# Patient Record
Sex: Male | Born: 1945 | Race: White | Hispanic: No | Marital: Married | Smoking: Former smoker
Health system: Southern US, Community
[De-identification: ages and names within clinical notes are randomized; demographics above are authoritative.]

## PROBLEM LIST (undated history)

## (undated) DIAGNOSIS — K5790 Diverticulosis of intestine, part unspecified, without perforation or abscess without bleeding: Secondary | ICD-10-CM

## (undated) DIAGNOSIS — E785 Hyperlipidemia, unspecified: Secondary | ICD-10-CM

## (undated) DIAGNOSIS — Z8719 Personal history of other diseases of the digestive system: Secondary | ICD-10-CM

## (undated) DIAGNOSIS — H269 Unspecified cataract: Secondary | ICD-10-CM

## (undated) DIAGNOSIS — I2510 Atherosclerotic heart disease of native coronary artery without angina pectoris: Secondary | ICD-10-CM

## (undated) DIAGNOSIS — T7840XA Allergy, unspecified, initial encounter: Secondary | ICD-10-CM

## (undated) DIAGNOSIS — I739 Peripheral vascular disease, unspecified: Secondary | ICD-10-CM

## (undated) DIAGNOSIS — K222 Esophageal obstruction: Secondary | ICD-10-CM

## (undated) DIAGNOSIS — I10 Essential (primary) hypertension: Secondary | ICD-10-CM

## (undated) DIAGNOSIS — K219 Gastro-esophageal reflux disease without esophagitis: Secondary | ICD-10-CM

## (undated) DIAGNOSIS — C801 Malignant (primary) neoplasm, unspecified: Secondary | ICD-10-CM

## (undated) HISTORY — DX: Essential (primary) hypertension: I10

## (undated) HISTORY — DX: Esophageal obstruction: K22.2

## (undated) HISTORY — DX: Allergy, unspecified, initial encounter: T78.40XA

## (undated) HISTORY — DX: Unspecified cataract: H26.9

## (undated) HISTORY — DX: Peripheral vascular disease, unspecified: I73.9

## (undated) HISTORY — PX: CORONARY ANGIOPLASTY WITH STENT PLACEMENT: SHX49

## (undated) HISTORY — DX: Gastro-esophageal reflux disease without esophagitis: K21.9

## (undated) HISTORY — DX: Hyperlipidemia, unspecified: E78.5

## (undated) HISTORY — DX: Malignant (primary) neoplasm, unspecified: C80.1

## (undated) HISTORY — DX: Atherosclerotic heart disease of native coronary artery without angina pectoris: I25.10

## (undated) HISTORY — DX: Diverticulosis of intestine, part unspecified, without perforation or abscess without bleeding: K57.90

## (undated) HISTORY — PX: CATARACT EXTRACTION: SUR2

---

## 2002-11-01 HISTORY — PX: CORONARY ANGIOPLASTY WITH STENT PLACEMENT: SHX49

## 2003-03-05 ENCOUNTER — Encounter: Payer: Self-pay | Admitting: Cardiovascular Disease

## 2003-03-08 ENCOUNTER — Ambulatory Visit (HOSPITAL_COMMUNITY): Admission: RE | Admit: 2003-03-08 | Discharge: 2003-03-09 | Payer: Self-pay | Admitting: Cardiology

## 2003-03-08 ENCOUNTER — Encounter: Payer: Self-pay | Admitting: Cardiology

## 2004-09-04 ENCOUNTER — Ambulatory Visit: Payer: Self-pay | Admitting: Internal Medicine

## 2004-11-01 HISTORY — PX: SHOULDER SURGERY: SHX246

## 2005-02-17 ENCOUNTER — Ambulatory Visit: Payer: Self-pay | Admitting: Internal Medicine

## 2005-06-09 ENCOUNTER — Ambulatory Visit: Payer: Self-pay | Admitting: Internal Medicine

## 2005-07-09 ENCOUNTER — Ambulatory Visit: Payer: Self-pay | Admitting: Internal Medicine

## 2005-12-01 ENCOUNTER — Ambulatory Visit: Payer: Self-pay | Admitting: Internal Medicine

## 2006-03-02 ENCOUNTER — Ambulatory Visit: Payer: Self-pay | Admitting: Internal Medicine

## 2006-06-01 ENCOUNTER — Ambulatory Visit: Payer: Self-pay | Admitting: Internal Medicine

## 2006-08-17 ENCOUNTER — Ambulatory Visit: Payer: Self-pay | Admitting: Internal Medicine

## 2006-08-17 LAB — CONVERTED CEMR LAB
ALT: 28 units/L (ref 0–40)
AST: 23 units/L (ref 0–37)
Albumin: 3.8 g/dL (ref 3.5–5.2)
BUN: 12 mg/dL (ref 6–23)
Basophils Relative: 0.4 % (ref 0.0–1.0)
Creatinine, Ser: 0.8 mg/dL (ref 0.4–1.5)
Eosinophil percent: 2.1 % (ref 0.0–5.0)
GFR calc non Af Amer: 104 mL/min
HCT: 41.9 % (ref 39.0–52.0)
HDL: 36.6 mg/dL — ABNORMAL LOW (ref >39.0)
Hemoglobin: 14 g/dL (ref 13.0–17.0)
Hgb A1c MFr Bld: 7.9 % — ABNORMAL HIGH (ref 4.6–6.0)
MCHC: 33.5 g/dL (ref 30.0–36.0)
Neutro Abs: 4.6 10*3/uL (ref 1.4–7.7)
Neutrophils Relative %: 66.3 % (ref 43.0–77.0)
PSA: 0.61 ng/mL (ref 0.10–4.00)
Potassium: 3.9 meq/L (ref 3.5–5.1)
RBC: 4.73 M/uL (ref 4.22–5.81)
RDW: 12.5 % (ref 11.5–14.6)
Sodium: 141 meq/L (ref 135–145)
Total Bilirubin: 0.6 mg/dL (ref 0.3–1.2)
Triglyceride fasting, serum: 158 mg/dL — ABNORMAL HIGH (ref 0–149)
WBC: 6.8 10*3/uL (ref 4.5–10.5)

## 2006-08-24 ENCOUNTER — Ambulatory Visit: Payer: Self-pay | Admitting: Internal Medicine

## 2006-11-23 ENCOUNTER — Ambulatory Visit: Payer: Self-pay | Admitting: Internal Medicine

## 2006-11-23 LAB — CONVERTED CEMR LAB: Hgb A1c MFr Bld: 7.1 % — ABNORMAL HIGH (ref 4.6–6.0)

## 2007-02-22 ENCOUNTER — Ambulatory Visit: Payer: Self-pay | Admitting: Internal Medicine

## 2007-05-24 DIAGNOSIS — E1151 Type 2 diabetes mellitus with diabetic peripheral angiopathy without gangrene: Secondary | ICD-10-CM | POA: Insufficient documentation

## 2007-05-24 DIAGNOSIS — I10 Essential (primary) hypertension: Secondary | ICD-10-CM | POA: Insufficient documentation

## 2007-05-24 DIAGNOSIS — I2510 Atherosclerotic heart disease of native coronary artery without angina pectoris: Secondary | ICD-10-CM | POA: Insufficient documentation

## 2007-07-12 ENCOUNTER — Ambulatory Visit: Payer: Self-pay | Admitting: Internal Medicine

## 2007-07-12 LAB — CONVERTED CEMR LAB: Hgb A1c MFr Bld: 6.8 % — ABNORMAL HIGH (ref 4.6–6.0)

## 2007-11-08 ENCOUNTER — Ambulatory Visit: Payer: Self-pay | Admitting: Internal Medicine

## 2007-11-08 DIAGNOSIS — J069 Acute upper respiratory infection, unspecified: Secondary | ICD-10-CM | POA: Insufficient documentation

## 2007-11-08 LAB — CONVERTED CEMR LAB: Hgb A1c MFr Bld: 6.9 % — ABNORMAL HIGH (ref 4.6–6.0)

## 2008-03-12 ENCOUNTER — Encounter: Payer: Self-pay | Admitting: Internal Medicine

## 2008-03-13 ENCOUNTER — Ambulatory Visit: Payer: Self-pay | Admitting: Internal Medicine

## 2008-03-13 DIAGNOSIS — I739 Peripheral vascular disease, unspecified: Secondary | ICD-10-CM | POA: Insufficient documentation

## 2008-05-07 ENCOUNTER — Ambulatory Visit: Payer: Self-pay | Admitting: Internal Medicine

## 2008-05-07 DIAGNOSIS — R109 Unspecified abdominal pain: Secondary | ICD-10-CM | POA: Insufficient documentation

## 2008-06-03 ENCOUNTER — Encounter: Payer: Self-pay | Admitting: Internal Medicine

## 2008-06-03 ENCOUNTER — Telehealth: Payer: Self-pay | Admitting: Internal Medicine

## 2008-07-31 ENCOUNTER — Ambulatory Visit: Payer: Self-pay | Admitting: Internal Medicine

## 2008-10-02 ENCOUNTER — Ambulatory Visit: Payer: Self-pay | Admitting: Internal Medicine

## 2009-01-08 ENCOUNTER — Ambulatory Visit: Payer: Self-pay | Admitting: Internal Medicine

## 2009-01-08 LAB — CONVERTED CEMR LAB: Hgb A1c MFr Bld: 6.2 % — ABNORMAL HIGH (ref 4.6–6.0)

## 2009-03-24 ENCOUNTER — Telehealth: Payer: Self-pay | Admitting: Internal Medicine

## 2009-05-07 ENCOUNTER — Ambulatory Visit: Payer: Self-pay | Admitting: Internal Medicine

## 2009-05-07 DIAGNOSIS — K219 Gastro-esophageal reflux disease without esophagitis: Secondary | ICD-10-CM | POA: Insufficient documentation

## 2009-05-29 ENCOUNTER — Encounter: Payer: Self-pay | Admitting: Internal Medicine

## 2009-05-29 LAB — HM DIABETES EYE EXAM

## 2009-09-17 ENCOUNTER — Ambulatory Visit: Payer: Self-pay | Admitting: Internal Medicine

## 2009-09-17 LAB — CONVERTED CEMR LAB
Bilirubin Urine: NEGATIVE
Blood in Urine, dipstick: NEGATIVE
Ketones, urine, test strip: NEGATIVE
Nitrite: NEGATIVE
Protein, U semiquant: NEGATIVE
Urobilinogen, UA: 0.2

## 2009-09-18 ENCOUNTER — Encounter (INDEPENDENT_AMBULATORY_CARE_PROVIDER_SITE_OTHER): Payer: Self-pay | Admitting: *Deleted

## 2009-09-18 LAB — CONVERTED CEMR LAB
ALT: 31 units/L (ref 0–53)
AST: 26 units/L (ref 0–37)
Albumin: 4.2 g/dL (ref 3.5–5.2)
BUN: 11 mg/dL (ref 6–23)
Basophils Absolute: 0 10*3/uL (ref 0.0–0.1)
Basophils Relative: 0.3 % (ref 0.0–3.0)
Chloride: 107 meq/L (ref 96–112)
Cholesterol: 155 mg/dL (ref 0–200)
Eosinophils Relative: 1.8 % (ref 0.0–5.0)
GFR calc non Af Amer: 79.86 mL/min (ref >60)
HCT: 38.2 % — ABNORMAL LOW (ref 39.0–52.0)
HDL: 44.5 mg/dL (ref >39.00)
Hemoglobin: 12.9 g/dL — ABNORMAL LOW (ref 13.0–17.0)
LDL Cholesterol: 81 mg/dL (ref 0–99)
Lymphs Abs: 1.4 10*3/uL (ref 0.7–4.0)
Monocytes Relative: 5.7 % (ref 3.0–12.0)
Neutro Abs: 4.3 10*3/uL (ref 1.4–7.7)
RBC: 4.34 M/uL (ref 4.22–5.81)
RDW: 12.8 % (ref 11.5–14.6)
TSH: 0.57 microintl units/mL (ref 0.35–5.50)
Total CHOL/HDL Ratio: 3
Total Protein: 6.9 g/dL (ref 6.0–8.3)
Triglycerides: 149 mg/dL (ref 0.0–149.0)

## 2009-10-02 ENCOUNTER — Encounter (INDEPENDENT_AMBULATORY_CARE_PROVIDER_SITE_OTHER): Payer: Self-pay | Admitting: *Deleted

## 2009-10-06 ENCOUNTER — Ambulatory Visit: Payer: Self-pay | Admitting: Internal Medicine

## 2009-10-06 ENCOUNTER — Encounter (INDEPENDENT_AMBULATORY_CARE_PROVIDER_SITE_OTHER): Payer: Self-pay | Admitting: *Deleted

## 2009-10-16 ENCOUNTER — Ambulatory Visit: Payer: Self-pay | Admitting: Internal Medicine

## 2009-10-16 LAB — HM COLONOSCOPY

## 2009-12-16 ENCOUNTER — Ambulatory Visit: Payer: Self-pay | Admitting: Internal Medicine

## 2009-12-16 LAB — CONVERTED CEMR LAB: Hgb A1c MFr Bld: 6.5 % (ref 4.6–6.5)

## 2009-12-19 ENCOUNTER — Telehealth: Payer: Self-pay

## 2009-12-22 ENCOUNTER — Telehealth: Payer: Self-pay | Admitting: Internal Medicine

## 2009-12-23 ENCOUNTER — Ambulatory Visit: Payer: Self-pay | Admitting: Internal Medicine

## 2010-03-17 ENCOUNTER — Ambulatory Visit: Payer: Self-pay | Admitting: Internal Medicine

## 2010-03-17 LAB — CONVERTED CEMR LAB: Hgb A1c MFr Bld: 6.3 % (ref 4.6–6.5)

## 2010-07-15 ENCOUNTER — Ambulatory Visit: Payer: Self-pay | Admitting: Internal Medicine

## 2010-07-15 LAB — CONVERTED CEMR LAB: Hgb A1c MFr Bld: 6.5 % (ref 4.6–6.5)

## 2010-08-03 ENCOUNTER — Telehealth: Payer: Self-pay

## 2010-11-10 ENCOUNTER — Encounter: Payer: Self-pay | Admitting: Internal Medicine

## 2010-11-11 ENCOUNTER — Ambulatory Visit
Admission: RE | Admit: 2010-11-11 | Discharge: 2010-11-11 | Payer: Self-pay | Source: Home / Self Care | Attending: Internal Medicine | Admitting: Internal Medicine

## 2010-11-11 ENCOUNTER — Other Ambulatory Visit: Payer: Self-pay | Admitting: Internal Medicine

## 2010-11-11 LAB — HEMOGLOBIN A1C: Hgb A1c MFr Bld: 6.7 % — ABNORMAL HIGH (ref 4.6–6.5)

## 2010-12-03 NOTE — Progress Notes (Signed)
Summary: Status of Paperwork?  Phone Note Call from Patient   Caller: Patient [phone] Reason for Call: Talk to Nurse, Talk to Doctor Summary of Call: Pt called to check on status of paperwork that he was supposed to have sent to [facility]..... Pt adv that he dropped a copy off at [facility] front desk on 12/10/2009.... Pt also faxed copy of paperwork this morning (12/22/2009).... Wants to make sure same was received...?  Initial call taken by: Debbra Riding,  December 22, 2009 12:46 PM  Follow-up for Phone Call        form rec'd - give to cindy to complete , informed pt will fax as soon as signed.  KIK Follow-up by: Duard Brady LPN,  December 22, 2009 2:31 PM

## 2010-12-03 NOTE — Assessment & Plan Note (Signed)
Summary: 66 month rov/njr   Vital Signs:  Patient profile:   65 year old male Weight:      182 pounds Temp:     97.7 degrees F oral BP sitting:   140 / 70  (right arm) Cuff size:   regular  Vitals Entered By: Duard Brady LPN (December 16, 2009 10:09 AM) CC: ROV / no problems  - requesting chang in PPI       FBS 122   CC:  ROV / no problems  - requesting chang in PPI       FBS 122.  History of Present Illness: 65 year old patient with history of hypertension, coronary artery disease, type 2 diabetes, and dyslipidemia.  He is done remarkably well.  No concerns or complaints.  His hemoglobin A1c last visit 6.1.  He continues to enjoy a nice glycemic control.  Denies any exertional chest pain.  He is now on omeprazole for gastroesophageal reflux disease and prefers this medication.  Allergies: No Known Drug Allergies  Past History:  Past Medical History: Reviewed history from 05/07/2009 and no changes required. Coronary artery disease Diabetes mellitus, type II Hypertension Hypercholesterolemia Peripheral vascular disease GERD  Past Surgical History: Angioplasty 2004 sigmoidoscopy 2003; colonoscopy December 2010  Family History: Reviewed history from 07/12/2007 and no changes required. Family History Other cancer-Breast father died age 56, pneumonia mother history breast cancer one brother one sister in good health  Review of Systems  The patient denies anorexia, fever, weight loss, weight gain, vision loss, decreased hearing, hoarseness, chest pain, syncope, dyspnea on exertion, peripheral edema, prolonged cough, headaches, hemoptysis, abdominal pain, melena, hematochezia, severe indigestion/heartburn, hematuria, incontinence, genital sores, muscle weakness, suspicious skin lesions, transient blindness, difficulty walking, depression, unusual weight change, abnormal bleeding, enlarged lymph nodes, angioedema, breast masses, and testicular masses.    Physical  Exam  General:  Well-developed,well-nourished,in no acute distress; alert,appropriate and cooperative throughout examination; normal blood pressure Head:  Normocephalic and atraumatic without obvious abnormalities. No apparent alopecia or balding. Eyes:  No corneal or conjunctival inflammation noted. EOMI. Perrla. Funduscopic exam benign, without hemorrhages, exudates or papilledema. Vision grossly normal. Mouth:  Oral mucosa and oropharynx without lesions or exudates.  Teeth in good repair. Neck:  No deformities, masses, or tenderness noted. Lungs:  Normal respiratory effort, chest expands symmetrically. Lungs are clear to auscultation, no crackles or wheezes. Heart:  Normal rate and regular rhythm. S1 and S2 normal without gallop, murmur, click, rub or other extra sounds. Abdomen:  Bowel sounds positive,abdomen soft and non-tender without masses, organomegaly or hernias noted. Msk:  No deformity or scoliosis noted of thoracic or lumbar spine.   Pulses:  absent right posterior tibial pulse Extremities:  No clubbing, cyanosis, edema, or deformity noted with normal full range of motion of all joints.   Skin:  Intact without suspicious lesions or rashes Cervical Nodes:  No lymphadenopathy noted   Impression & Recommendations:  Problem # 1:  HYPERTENSION (ICD-401.9)  His updated medication list for this problem includes:    Lisinopril-hydrochlorothiazide 20-12.5 Mg Tabs (Lisinopril-hydrochlorothiazide) .Marland Kitchen... 1 once daily    Amlodipine Besylate 5 Mg Tabs (Amlodipine besylate) ..... One tablet daily  His updated medication list for this problem includes:    Lisinopril-hydrochlorothiazide 20-12.5 Mg Tabs (Lisinopril-hydrochlorothiazide) .Marland Kitchen... 1 once daily    Amlodipine Besylate 5 Mg Tabs (Amlodipine besylate) ..... One tablet daily  Problem # 2:  DIABETES MELLITUS, TYPE II (ICD-250.00)  His updated medication list for this problem includes:    Metformin Hcl 1000  Mg Tabs (Metformin hcl)  .Marland Kitchen... 1 two times a day    Lisinopril-hydrochlorothiazide 20-12.5 Mg Tabs (Lisinopril-hydrochlorothiazide) .Marland Kitchen... 1 once daily    Adult Aspirin Low Strength 81 Mg Tbdp (Aspirin) .Marland Kitchen... 1 once daily as needed    Glimepiride 4 Mg Tabs (Glimepiride) .Marland Kitchen... 1/2 once daily    His updated medication list for this problem includes:    Metformin Hcl 1000 Mg Tabs (Metformin hcl) .Marland Kitchen... 1 two times a day    Lisinopril-hydrochlorothiazide 20-12.5 Mg Tabs (Lisinopril-hydrochlorothiazide) .Marland Kitchen... 1 once daily    Adult Aspirin Low Strength 81 Mg Tbdp (Aspirin) .Marland Kitchen... 1 once daily as needed    Glimepiride 4 Mg Tabs (Glimepiride) .Marland Kitchen... 1/2 once daily  Problem # 3:  CORONARY ARTERY DISEASE (ICD-414.00)  His updated medication list for this problem includes:    Lisinopril-hydrochlorothiazide 20-12.5 Mg Tabs (Lisinopril-hydrochlorothiazide) .Marland Kitchen... 1 once daily    Adult Aspirin Low Strength 81 Mg Tbdp (Aspirin) .Marland Kitchen... 1 once daily as needed    Amlodipine Besylate 5 Mg Tabs (Amlodipine besylate) ..... One tablet daily  His updated medication list for this problem includes:    Lisinopril-hydrochlorothiazide 20-12.5 Mg Tabs (Lisinopril-hydrochlorothiazide) .Marland Kitchen... 1 once daily    Adult Aspirin Low Strength 81 Mg Tbdp (Aspirin) .Marland Kitchen... 1 once daily as needed    Amlodipine Besylate 5 Mg Tabs (Amlodipine besylate) ..... One tablet daily  Complete Medication List: 1)  Metformin Hcl 1000 Mg Tabs (Metformin hcl) .Marland Kitchen.. 1 two times a day 2)  Lisinopril-hydrochlorothiazide 20-12.5 Mg Tabs (Lisinopril-hydrochlorothiazide) .Marland Kitchen.. 1 once daily 3)  Simvastatin 80 Mg Tabs (simvastatin)  .Marland Kitchen.. 1 once daily 4)  Adult Aspirin Low Strength 81 Mg Tbdp (Aspirin) .Marland Kitchen.. 1 once daily as needed 5)  Glimepiride 4 Mg Tabs (Glimepiride) .... 1/2 once daily 6)  Hydrocodone-acetaminophen 5-500 Mg Tabs (Hydrocodone-acetaminophen) .... One every 6 hours as needed for pain 7)  Amlodipine Besylate 5 Mg Tabs (Amlodipine besylate) .... One tablet daily 8)   Omeprazole 20 Mg Cpdr (Omeprazole) .... One daily  Other Orders: Venipuncture (04540) TLB-A1C / Hgb A1C (Glycohemoglobin) (83036-A1C)  Patient Instructions: 1)  Please schedule a follow-up appointment in 3 months. 2)  Limit your Sodium (Salt). 3)  Avoid foods high in acid (tomatoes, citrus juices, spicy foods). Avoid eating within two hours of lying down or before exercising. Do not over eat; try smaller more frequent meals. Elevate head of bed twelve inches when sleeping. 4)  It is important that you exercise regularly at least 20 minutes 5 times a week. If you develop chest pain, have severe difficulty breathing, or feel very tired , stop exercising immediately and seek medical attention. 5)  Check your blood sugars regularly. If your readings are usually above : or below 70 you should contact our office. 6)  It is important that your Diabetic A1c level is checked every 3 months. 7)  See your eye doctor yearly to check for diabetic eye damage. Prescriptions: OMEPRAZOLE 20 MG CPDR (OMEPRAZOLE) one daily  #90 x 6   Entered and Authorized by:   Gordy Savers  MD   Signed by:   Gordy Savers  MD on 12/16/2009   Method used:   Print then Give to Patient   RxID:   [id number] OMEPRAZOLE 20 MG CPDR (OMEPRAZOLE) one daily  #90 x 6   Entered and Authorized by:   Gordy Savers  MD   Signed by:   Gordy Savers  MD on 12/16/2009   Method used:   Electronically to  [facility] (retail)       [address]       [place], [place]  [address]       Ph: [phone]       Fax: [phone]   RxID:   [id number]

## 2010-12-03 NOTE — Assessment & Plan Note (Signed)
Summary: 3 month fup//ccm   Vital Signs:  Patient profile:   65 year old male Weight:      180 pounds Temp:     97.8 degrees F oral BP sitting:   112 / 70  (right arm) Cuff size:   regular  Vitals Entered By: Duard Brady LPN (Mar 17, 2010 10:26 AM) CC: 3 mos rov - doing well    fbs 130 Is Patient Diabetic? Yes Did you bring your meter with you today? No   CC:  3 mos rov - doing well    fbs 130.  History of Present Illness: 65 year old patient who is seen today for follow up of his type 2 diabetes, hypertension, and dyslipidemia.  He has peripheral vascular disease, which has been stable.  He denies any claudication.  He has coronary  Artery disease, which has also been stable.  He denies any exertional chest pain.  His last hemoglobin A1c 6.5, up from 6.1.  Preventive Screening-Counseling & Management  Alcohol-Tobacco     Smoking Status: never  Allergies (verified): No Known Drug Allergies  Past History:  Past Medical History: Reviewed history from 05/07/2009 and no changes required. Coronary artery disease Diabetes mellitus, type II Hypertension Hypercholesterolemia Peripheral vascular disease GERD  Social History: Smoking Status:  never  Review of Systems  The patient denies anorexia, fever, weight loss, weight gain, vision loss, decreased hearing, hoarseness, chest pain, syncope, dyspnea on exertion, peripheral edema, prolonged cough, headaches, hemoptysis, abdominal pain, melena, hematochezia, severe indigestion/heartburn, hematuria, incontinence, genital sores, muscle weakness, suspicious skin lesions, transient blindness, difficulty walking, depression, unusual weight change, abnormal bleeding, enlarged lymph nodes, angioedema, breast masses, and testicular masses.    Physical Exam  General:  Well-developed,well-nourished,in no acute distress; alert,appropriate and cooperative throughout examination Head:  Normocephalic and atraumatic without obvious  abnormalities. No apparent alopecia or balding. Eyes:  No corneal or conjunctival inflammation noted. EOMI. Perrla. Funduscopic exam benign, without hemorrhages, exudates or papilledema. Vision grossly normal. Mouth:  Oral mucosa and oropharynx without lesions or exudates.  Teeth in good repair. Neck:  No deformities, masses, or tenderness noted. Lungs:  Normal respiratory effort, chest expands symmetrically. Lungs are clear to auscultation, no crackles or wheezes. Heart:  Normal rate and regular rhythm. S1 and S2 normal without gallop, murmur, click, rub or other extra sounds. Abdomen:  Bowel sounds positive,abdomen soft and non-tender without masses, organomegaly or hernias noted.   Impression & Recommendations:  Problem # 1:  HYPERTENSION (ICD-401.9)  His updated medication list for this problem includes:    Lisinopril-hydrochlorothiazide 20-12.5 Mg Tabs (Lisinopril-hydrochlorothiazide) .Marland Kitchen... 1 once daily    Amlodipine Besylate 5 Mg Tabs (Amlodipine besylate) ..... One tablet daily  His updated medication list for this problem includes:    Lisinopril-hydrochlorothiazide 20-12.5 Mg Tabs (Lisinopril-hydrochlorothiazide) .Marland Kitchen... 1 once daily    Amlodipine Besylate 5 Mg Tabs (Amlodipine besylate) ..... One tablet daily  Problem # 2:  DIABETES MELLITUS, TYPE II (ICD-250.00)  His updated medication list for this problem includes:    Metformin Hcl 1000 Mg Tabs (Metformin hcl) .Marland Kitchen... 1 two times a day    Lisinopril-hydrochlorothiazide 20-12.5 Mg Tabs (Lisinopril-hydrochlorothiazide) .Marland Kitchen... 1 once daily    Adult Aspirin Low Strength 81 Mg Tbdp (Aspirin) .Marland Kitchen... 1 once daily as needed    Glimepiride 4 Mg Tabs (Glimepiride) .Marland Kitchen... 1/2 once daily    His updated medication list for this problem includes:    Metformin Hcl 1000 Mg Tabs (Metformin hcl) .Marland Kitchen... 1 two times a day  Lisinopril-hydrochlorothiazide 20-12.5 Mg Tabs (Lisinopril-hydrochlorothiazide) .Marland Kitchen... 1 once daily    Adult Aspirin Low  Strength 81 Mg Tbdp (Aspirin) .Marland Kitchen... 1 once daily as needed    Glimepiride 4 Mg Tabs (Glimepiride) .Marland Kitchen... 1/2 once daily  Complete Medication List: 1)  Metformin Hcl 1000 Mg Tabs (Metformin hcl) .Marland Kitchen.. 1 two times a day 2)  Lisinopril-hydrochlorothiazide 20-12.5 Mg Tabs (Lisinopril-hydrochlorothiazide) .Marland Kitchen.. 1 once daily 3)  Simvastatin 80 Mg Tabs (simvastatin)  .Marland Kitchen.. 1 once daily 4)  Adult Aspirin Low Strength 81 Mg Tbdp (Aspirin) .Marland Kitchen.. 1 once daily as needed 5)  Glimepiride 4 Mg Tabs (Glimepiride) .... 1/2 once daily 6)  Amlodipine Besylate 5 Mg Tabs (Amlodipine besylate) .... One tablet daily 7)  Omeprazole 20 Mg Cpdr (Omeprazole) .... One daily  Other Orders: Venipuncture ([address]) TLB-A1C / Hgb A1C (Glycohemoglobin) (83036-A1C)  Patient Instructions: 1)  It is important that you exercise regularly at least 20 minutes 5 times a week. If you develop chest pain, have severe difficulty breathing, or feel very tired , stop exercising immediately and seek medical attention. 2)  Check your blood sugars regularly. If your readings are usually above : or below 70 you should contact our office. 3)  It is important that your Diabetic A1c level is checked every 3 months. 4)  See your eye doctor yearly to check for diabetic eye damage. 5)  Please schedule a follow-up appointment in 4 months.

## 2010-12-03 NOTE — Letter (Signed)
Summary: Health Screening Results   Health Screening Results   Imported By: Maryln Gottron 12/29/2009 10:48:37  _____________________________________________________________________  External Attachment:    Type:   Image     Comment:   External Document

## 2010-12-03 NOTE — Assessment & Plan Note (Signed)
Summary: 4 month rov/njr   Vital Signs:  Patient profile:   65 year old male Weight:      181 pounds O2 Sat:      95 % on Room air Temp:     97.9 degrees F oral Pulse rate:   90 / minute Resp:     16 per minute BP sitting:   120 / 78  (left arm) Cuff size:   regular  Vitals Entered By: Duard Brady LPN (November 11, 2010 11:39 AM)  O2 Flow:  Room air CC: 4 mos rov - doing well Is Patient Diabetic? No   CC:  4 mos rov - doing well.  History of Present Illness: 30  is for follow-up of type 2 diabetes.  Is maintained excellent glycemic control.  Is having no hypoglycemia.  Hemoglobin A1c have been consistently less than 6.5. He has coronary artery disease, and peripheral vascular disease, which have been stable.  Denies any exertional chest pain or claudication he is treated hypertension, which has been stable.  He remains on triple therapy. He also remains on simvastatin 20 mg daily, which he continues to tolerate.  Preventive Screening-Counseling & Management  Alcohol-Tobacco     Smoking Status: quit     Year Quit: 2006  Allergies (verified): No Known Drug Allergies  Past History:  Past Medical History: Reviewed history from 05/07/2009 and no changes required. Coronary artery disease Diabetes mellitus, type II Hypertension Hypercholesterolemia Peripheral vascular disease GERD  Past Surgical History: Reviewed history from 12/16/2009 and no changes required. Angioplasty 2004 sigmoidoscopy 2003; colonoscopy December 2010  Social History: Smoking Status:  quit  Review of Systems  The patient denies anorexia, fever, weight loss, weight gain, vision loss, decreased hearing, hoarseness, chest pain, syncope, dyspnea on exertion, peripheral edema, prolonged cough, headaches, hemoptysis, abdominal pain, melena, hematochezia, severe indigestion/heartburn, hematuria, incontinence, genital sores, muscle weakness, suspicious skin lesions, transient blindness,  difficulty walking, depression, unusual weight change, abnormal bleeding, enlarged lymph nodes, angioedema, breast masses, and testicular masses.    Physical Exam  General:  Well-developed,well-nourished,in no acute distress; alert,appropriate and cooperative throughout examination Head:  Normocephalic and atraumatic without obvious abnormalities. No apparent alopecia or balding. Eyes:  No corneal or conjunctival inflammation noted. EOMI. Perrla. Funduscopic exam benign, without hemorrhages, exudates or papilledema. Vision grossly normal. Mouth:  Oral mucosa and oropharynx without lesions or exudates.  Teeth in good repair. Neck:  No deformities, masses, or tenderness noted. Lungs:  Normal respiratory effort, chest expands symmetrically. Lungs are clear to auscultation, no crackles or wheezes. Heart:  Normal rate and regular rhythm. S1 and S2 normal without gallop, murmur, click, rub or other extra sounds. Abdomen:  Bowel sounds positive,abdomen soft and non-tender without masses, organomegaly or hernias noted. Msk:  No deformity or scoliosis noted of thoracic or lumbar spine.   Extremities:  No clubbing, cyanosis, edema, or deformity noted with normal full range of motion of all joints.    Diabetes Management Exam:    Eye Exam:       Eye Exam done here today          Results: normal   Impression & Recommendations:  Problem # 1:  PERIPHERAL VASCULAR DISEASE (ICD-443.9)  Problem # 2:  HYPERTENSION (ICD-401.9)  His updated medication list for this problem includes:    Lisinopril-hydrochlorothiazide 20-12.5 Mg Tabs (Lisinopril-hydrochlorothiazide) .Marland Kitchen... 1 once daily    Amlodipine Besylate 5 Mg Tabs (Amlodipine besylate) ..... One tablet daily  His updated medication list for this problem includes:  Lisinopril-hydrochlorothiazide 20-12.5 Mg Tabs (Lisinopril-hydrochlorothiazide) .Marland Kitchen... 1 once daily    Amlodipine Besylate 5 Mg Tabs (Amlodipine besylate) ..... One tablet daily  Problem #  3:  DIABETES MELLITUS, TYPE II (ICD-250.00)  His updated medication list for this problem includes:    Metformin Hcl 1000 Mg Tabs (Metformin hcl) .Marland Kitchen... 1 two times a day    Lisinopril-hydrochlorothiazide 20-12.5 Mg Tabs (Lisinopril-hydrochlorothiazide) .Marland Kitchen... 1 once daily    Adult Aspirin Low Strength 81 Mg Tbdp (Aspirin) .Marland Kitchen... 1 once daily as needed    Glimepiride 4 Mg Tabs (Glimepiride) .Marland Kitchen... 1/2 once daily    His updated medication list for this problem includes:    Metformin Hcl 1000 Mg Tabs (Metformin hcl) .Marland Kitchen... 1 two times a day    Lisinopril-hydrochlorothiazide 20-12.5 Mg Tabs (Lisinopril-hydrochlorothiazide) .Marland Kitchen... 1 once daily    Adult Aspirin Low Strength 81 Mg Tbdp (Aspirin) .Marland Kitchen... 1 once daily as needed    Glimepiride 4 Mg Tabs (Glimepiride) .Marland Kitchen... 1/2 once daily  Orders: Venipuncture ([address]) TLB-A1C / Hgb A1C (Glycohemoglobin) (83036-A1C) Specimen Handling ([address])  Problem # 4:  CORONARY ARTERY DISEASE (ICD-414.00)  His updated medication list for this problem includes:    Lisinopril-hydrochlorothiazide 20-12.5 Mg Tabs (Lisinopril-hydrochlorothiazide) .Marland Kitchen... 1 once daily    Adult Aspirin Low Strength 81 Mg Tbdp (Aspirin) .Marland Kitchen... 1 once daily as needed    Amlodipine Besylate 5 Mg Tabs (Amlodipine besylate) ..... One tablet daily  His updated medication list for this problem includes:    Lisinopril-hydrochlorothiazide 20-12.5 Mg Tabs (Lisinopril-hydrochlorothiazide) .Marland Kitchen... 1 once daily    Adult Aspirin Low Strength 81 Mg Tbdp (Aspirin) .Marland Kitchen... 1 once daily as needed    Amlodipine Besylate 5 Mg Tabs (Amlodipine besylate) ..... One tablet daily  Complete Medication List: 1)  Metformin Hcl 1000 Mg Tabs (Metformin hcl) .Marland Kitchen.. 1 two times a day 2)  Lisinopril-hydrochlorothiazide 20-12.5 Mg Tabs (Lisinopril-hydrochlorothiazide) .Marland Kitchen.. 1 once daily 3)  Adult Aspirin Low Strength 81 Mg Tbdp (Aspirin) .Marland Kitchen.. 1 once daily as needed 4)  Glimepiride 4 Mg Tabs (Glimepiride) .... 1/2 once daily 5)   Amlodipine Besylate 5 Mg Tabs (Amlodipine besylate) .... One tablet daily 6)  Omeprazole 20 Mg Cpdr (Omeprazole) .... One daily 7)  Simvastatin 20 Mg Tabs (Simvastatin) .... One daily  Patient Instructions: 1)  Please schedule a follow-up appointment in 4 months for CPX 2)  Limit your Sodium (Salt). 3)  It is important that you exercise regularly at least 20 minutes 5 times a week. If you develop chest pain, have severe difficulty breathing, or feel very tired , stop exercising immediately and seek medical attention. 4)  Check your blood sugars regularly. If your readings are usually above : or below 70 you should contact our office. 5)  It is important that your Diabetic A1c level is checked every 3 months. 6)  See your eye doctor yearly to check for diabetic eye damage. Prescriptions: SIMVASTATIN 20 MG TABS (SIMVASTATIN) one daily  #90 x 6   Entered and Authorized by:   Gordy Savers  MD   Signed by:   Gordy Savers  MD on 11/11/2010   Method used:   Electronically to        [facility] (retail)       [address]       [place], [place]  [address]       Ph: [phone]       Fax: [phone]   RxID:  [id number] OMEPRAZOLE 20 MG CPDR (OMEPRAZOLE) one daily  #90 x 6   Entered and Authorized by:   Gordy Savers  MD   Signed by:   Gordy Savers  MD on 11/11/2010   Method used:   Electronically to        [facility] (retail)       [address]       [place], [place]  [address]       Ph: [phone]       Fax: [phone]   RxID:   [id number] AMLODIPINE BESYLATE 5 MG TABS (AMLODIPINE BESYLATE) one tablet daily  #90 x 6   Entered and Authorized by:   Gordy Savers  MD   Signed by:   Gordy Savers  MD on 11/11/2010   Method used:   Electronically to        [facility] (retail)       [address]       [place], [place]  [address]       Ph:  [phone]       Fax: [phone]   RxID:   [id number] GLIMEPIRIDE 4 MG  TABS (GLIMEPIRIDE) 1/2 once daily  #90 x 3   Entered and Authorized by:   Gordy Savers  MD   Signed by:   Gordy Savers  MD on 11/11/2010   Method used:   Electronically to        [facility] (retail)       [address]       [place], [place]  [address]       Ph: [phone]       Fax: [phone]   RxID:   [id number] LISINOPRIL-HYDROCHLOROTHIAZIDE 20-12.5 MG  TABS (LISINOPRIL-HYDROCHLOROTHIAZIDE) 1 once daily  #90 x 3   Entered and Authorized by:   Gordy Savers  MD   Signed by:   Gordy Savers  MD on 11/11/2010   Method used:   Electronically to        [facility] (retail)       [address]       [place], [place]  [address]       Ph: [phone]       Fax: [phone]   RxID:   [id number] METFORMIN HCL 1000 MG  TABS (METFORMIN HCL) 1 two times a day  #180 x 6   Entered and Authorized by:   Gordy Savers  MD   Signed by:   Gordy Savers  MD on 11/11/2010   Method used:   Electronically to        [facility] (retail)       [address]       [place], [place]  [address]       Ph: [phone]       Fax: [phone]   RxID:   [id number]    Orders Added: 1)  Venipuncture [[address]] 2)  TLB-A1C / Hgb A1C (Glycohemoglobin) [83036-A1C] 3)  Specimen Handling [[address]] 4)  Est. Patient Level IV [[address]]

## 2010-12-03 NOTE — Progress Notes (Signed)
Summary: Needs A1C and Cholesterol Results  Phone Note Call from Patient Call back at [facility] Phone [phone]   Caller: Patient Complaint: Urinary/GYN Problems Summary of Call: Needs to know last A1C and cholesterol results.   Initial call taken by: Trixie Dredge,  August 03, 2010 9:48 AM  Follow-up for Phone Call        called pt - HgA1c  6.5 and there was no cholest. drawn    KIK Follow-up by: Duard Brady LPN,  August 03, 2010 10:57 AM

## 2010-12-03 NOTE — Assessment & Plan Note (Signed)
Summary: 4 month rov/njr   Vital Signs:  Patient profile:   65 year old male Weight:      180 pounds Temp:     97.5 degrees F oral BP sitting:   120 / 70  (right arm) Cuff size:   regular  Vitals Entered By: Duard Brady LPN (July 15, 2010 9:54 AM) CC: 4 mos rov - doing well      fbs130, Hypertension Management Is Patient Diabetic? Yes Did you bring your meter with you today? No Flu Vaccine Consent Questions     Do you have a history of severe allergic reactions to this vaccine? no    Any prior history of allergic reactions to egg and/or gelatin? no    Do you have a sensitivity to the preservative Thimersol? no    Do you have a past history of Guillan-Barre Syndrome? no    Do you currently have an acute febrile illness? no    Have you ever had a severe reaction to latex? no    Vaccine information given and explained to patient? yes    Are you currently pregnant? no    Lot Number:[id number]   Exp Date:05/01/2011   Site Given  Left Deltoid IM   CC:  4 mos rov - doing well      fbs130 and Hypertension Management.  History of Present Illness:  65 year old patient who is seen today for follow-up of his type 2 diabetes.  He has a history of hypertension, which has the well-controlled.  Antihypertensive regimen includes amlodipine.  Is also on simvastatin 80 mg daily for  dyslipidemia.  He is doing quite well without concerns or complaints.  He has peripheral vascular disease, but denies any claudication.  Denies any exertional chest pain.  Hypertension History:      Positive major cardiovascular risk factors include male age 76 years old or older, diabetes, and hypertension.  Negative major cardiovascular risk factors include non-tobacco-user status.        Positive history for target organ damage include ASHD (either angina/prior MI/prior CABG) and peripheral vascular disease.     Allergies (verified): No Known Drug Allergies  Past History:  Past Medical  History: Reviewed history from 05/07/2009 and no changes required. Coronary artery disease Diabetes mellitus, type II Hypertension Hypercholesterolemia Peripheral vascular disease GERD  Review of Systems  The patient denies anorexia, fever, weight loss, weight gain, vision loss, decreased hearing, hoarseness, chest pain, syncope, dyspnea on exertion, peripheral edema, prolonged cough, headaches, hemoptysis, abdominal pain, melena, hematochezia, severe indigestion/heartburn, hematuria, incontinence, genital sores, muscle weakness, suspicious skin lesions, transient blindness, difficulty walking, depression, unusual weight change, abnormal bleeding, enlarged lymph nodes, angioedema, breast masses, and testicular masses.    Physical Exam  General:  Well-developed,well-nourished,in no acute distress; alert,appropriate and cooperative throughout examination Head:  Normocephalic and atraumatic without obvious abnormalities. No apparent alopecia or balding. Eyes:  No corneal or conjunctival inflammation noted. EOMI. Perrla. Funduscopic exam benign, without hemorrhages, exudates or papilledema. Vision grossly normal. Mouth:  Oral mucosa and oropharynx without lesions or exudates.  Teeth in good repair. Neck:  No deformities, masses, or tenderness noted. Lungs:  Normal respiratory effort, chest expands symmetrically. Lungs are clear to auscultation, no crackles or wheezes. Heart:  Normal rate and regular rhythm. S1 and S2 normal without gallop, murmur, click, rub or other extra sounds. Abdomen:  Bowel sounds positive,abdomen soft and non-tender without masses, organomegaly or hernias noted. Msk:  No deformity or scoliosis noted of thoracic or lumbar spine.  Extremities:  No clubbing, cyanosis, edema, or deformity noted with normal full range of motion of all joints.   Skin:  Intact without suspicious lesions or rashes Cervical Nodes:  No lymphadenopathy noted   Impression &  Recommendations:  Problem # 1:  HYPERTENSION (ICD-401.9)  His updated medication list for this problem includes:    Lisinopril-hydrochlorothiazide 20-12.5 Mg Tabs (Lisinopril-hydrochlorothiazide) .Marland Kitchen... 1 once daily    Amlodipine Besylate 5 Mg Tabs (Amlodipine besylate) ..... One tablet daily  Problem # 2:  DIABETES MELLITUS, TYPE II (ICD-250.00)  His updated medication list for this problem includes:    Metformin Hcl 1000 Mg Tabs (Metformin hcl) .Marland Kitchen... 1 two times a day    Lisinopril-hydrochlorothiazide 20-12.5 Mg Tabs (Lisinopril-hydrochlorothiazide) .Marland Kitchen... 1 once daily    Adult Aspirin Low Strength 81 Mg Tbdp (Aspirin) .Marland Kitchen... 1 once daily as needed    Glimepiride 4 Mg Tabs (Glimepiride) .Marland Kitchen... 1/2 once daily  Orders: Venipuncture (04540) TLB-A1C / Hgb A1C (Glycohemoglobin) (83036-A1C) Specimen Handling (98119)  Problem # 3:  CORONARY ARTERY DISEASE (ICD-414.00)  His updated medication list for this problem includes:    Lisinopril-hydrochlorothiazide 20-12.5 Mg Tabs (Lisinopril-hydrochlorothiazide) .Marland Kitchen... 1 once daily    Adult Aspirin Low Strength 81 Mg Tbdp (Aspirin) .Marland Kitchen... 1 once daily as needed    Amlodipine Besylate 5 Mg Tabs (Amlodipine besylate) ..... One tablet daily  Problem # 4:  PERIPHERAL VASCULAR DISEASE (ICD-443.9)  Complete Medication List: 1)  Metformin Hcl 1000 Mg Tabs (Metformin hcl) .Marland Kitchen.. 1 two times a day 2)  Lisinopril-hydrochlorothiazide 20-12.5 Mg Tabs (Lisinopril-hydrochlorothiazide) .Marland Kitchen.. 1 once daily 3)  Adult Aspirin Low Strength 81 Mg Tbdp (Aspirin) .Marland Kitchen.. 1 once daily as needed 4)  Glimepiride 4 Mg Tabs (Glimepiride) .... 1/2 once daily 5)  Amlodipine Besylate 5 Mg Tabs (Amlodipine besylate) .... One tablet daily 6)  Omeprazole 20 Mg Cpdr (Omeprazole) .... One daily 7)  Simvastatin 20 Mg Tabs (Simvastatin) .... One daily  Other Orders: Admin 1st Vaccine (14782) Flu Vaccine 52yrs + (95621)  Hypertension Assessment/Plan:      The patient's hypertensive risk  group is category C: Target organ damage and/or diabetes.  Today's blood pressure is 120/70.    Patient Instructions: 1)  Please schedule a follow-up appointment in 4 months. 2)  Advised not to eat any food or drink any liquids after 10 PM the night before your procedure. 3)  It is important that you exercise regularly at least 20 minutes 5 times a week. If you develop chest pain, have severe difficulty breathing, or feel very tired , stop exercising immediately and seek medical attention. 4)  Check your blood sugars regularly. If your readings are usually above : or below 70 you should contact our office. 5)  It is important that your Diabetic A1c level is checked every 3 months. 6)  See your eye doctor yearly to check for diabetic eye damage. Prescriptions: SIMVASTATIN 20 MG TABS (SIMVASTATIN) one daily  #90 x 6   Entered and Authorized by:   Gordy Savers  MD   Signed by:   Gordy Savers  MD on 07/15/2010   Method used:   Electronically to        [facility] (retail)       [address]       [place], [place]  [address]       Ph: [phone]       Fax: [phone]   RxID:  [id number] OMEPRAZOLE 20 MG CPDR (OMEPRAZOLE) one daily  #90 x 6   Entered and Authorized by:   Gordy Savers  MD   Signed by:   Gordy Savers  MD on 07/15/2010   Method used:   Electronically to        [facility] (retail)       [address]       [place], [place]  [address]       Ph: [phone]       Fax: [phone]   RxID:   [id number] AMLODIPINE BESYLATE 5 MG TABS (AMLODIPINE BESYLATE) one tablet daily  #90 x 6   Entered and Authorized by:   Gordy Savers  MD   Signed by:   Gordy Savers  MD on 07/15/2010   Method used:   Electronically to        [facility] (retail)       [address]       [place], [place]  [address]       Ph: [phone]       Fax: [phone]   RxID:    [id number] GLIMEPIRIDE 4 MG  TABS (GLIMEPIRIDE) 1/2 once daily  #90 x 3   Entered and Authorized by:   Gordy Savers  MD   Signed by:   Gordy Savers  MD on 07/15/2010   Method used:   Electronically to        [facility] (retail)       [address]       [place], [place]  [address]       Ph: [phone]       Fax: [phone]   RxID:   [id number] LISINOPRIL-HYDROCHLOROTHIAZIDE 20-12.5 MG  TABS (LISINOPRIL-HYDROCHLOROTHIAZIDE) 1 once daily  #90 x 3   Entered and Authorized by:   Gordy Savers  MD   Signed by:   Gordy Savers  MD on 07/15/2010   Method used:   Electronically to        [facility] (retail)       [address]       [place], [place]  [address]       Ph: [phone]       Fax: [phone]   RxID:   [id number] METFORMIN HCL 1000 MG  TABS (METFORMIN HCL) 1 two times a day  #180 x 6   Entered and Authorized by:   Gordy Savers  MD   Signed by:   Gordy Savers  MD on 07/15/2010   Method used:   Electronically to        [facility] (retail)       [address]       [place], [place]  [address]       Ph: [phone]       Fax: [phone]   RxID:   [id number]

## 2010-12-03 NOTE — Progress Notes (Signed)
Summary: Pt req status of form he brought by to be completed by Dr.  Phone Note Call from Patient   Caller: Patient Summary of Call: Pt called to check on status of form he brought by for his insurance company, [employer]. Pt says that he check with the company and the form has not been rcvd. Please fax to [phone]. Initial call taken by: Lucy Antigua,  December 19, 2009 11:56 AM  Follow-up for Phone Call        ???please check on status of the form.  I do not recall seeing this form or completing Follow-up by: Gordy Savers  MD,  December 19, 2009 12:59 PM  Additional Follow-up for Phone Call Additional follow up Details #1::        called hm number - ans mach - LMTCB , who did he leave the form with, cant find . May need to bring another one from him , sorry for any inconvience.  Additional Follow-up by: Duard Brady LPN,  December 19, 2009 2:22 PM

## 2011-02-01 LAB — GLUCOSE, CAPILLARY
Glucose-Capillary: 157 mg/dL — ABNORMAL HIGH (ref 70–99)
Glucose-Capillary: 96 mg/dL (ref 70–99)

## 2011-03-05 ENCOUNTER — Other Ambulatory Visit (INDEPENDENT_AMBULATORY_CARE_PROVIDER_SITE_OTHER): Payer: 59 | Admitting: Internal Medicine

## 2011-03-05 DIAGNOSIS — Z0000 Encounter for general adult medical examination without abnormal findings: Secondary | ICD-10-CM

## 2011-03-05 LAB — PSA: PSA: 1.27 ng/mL (ref 0.10–4.00)

## 2011-03-05 LAB — POCT URINALYSIS DIPSTICK
Bilirubin, UA: NEGATIVE
Ketones, UA: NEGATIVE

## 2011-03-05 LAB — CBC WITH DIFFERENTIAL/PLATELET
Basophils Relative: 0.3 % (ref 0.0–3.0)
Eosinophils Absolute: 0.2 10*3/uL (ref 0.0–0.7)
Eosinophils Relative: 2.5 % (ref 0.0–5.0)
Lymphocytes Relative: 22.3 % (ref 12.0–46.0)
MCV: 85.9 fl (ref 78.0–100.0)
Monocytes Absolute: 0.5 10*3/uL (ref 0.1–1.0)
Neutrophils Relative %: 67.7 % (ref 43.0–77.0)
Platelets: 229 10*3/uL (ref 150.0–400.0)
RBC: 4.25 Mil/uL (ref 4.22–5.81)
WBC: 6.3 10*3/uL (ref 4.5–10.5)

## 2011-03-05 LAB — BASIC METABOLIC PANEL
BUN: 17 mg/dL (ref 6–23)
Calcium: 9.5 mg/dL (ref 8.4–10.5)
Chloride: 102 mEq/L (ref 96–112)
Creatinine, Ser: 0.9 mg/dL (ref 0.4–1.5)

## 2011-03-05 LAB — MICROALBUMIN / CREATININE URINE RATIO
Creatinine,U: 158.1 mg/dL
Microalb, Ur: 1.4 mg/dL (ref 0.0–1.9)

## 2011-03-05 LAB — HEMOGLOBIN A1C: Hgb A1c MFr Bld: 6.6 % — ABNORMAL HIGH (ref 4.6–6.5)

## 2011-03-05 LAB — HEPATIC FUNCTION PANEL
Alkaline Phosphatase: 64 U/L (ref 39–117)
Bilirubin, Direct: 0.1 mg/dL (ref 0.0–0.3)
Total Bilirubin: 0.4 mg/dL (ref 0.3–1.2)

## 2011-03-12 ENCOUNTER — Ambulatory Visit (INDEPENDENT_AMBULATORY_CARE_PROVIDER_SITE_OTHER): Payer: 59 | Admitting: Internal Medicine

## 2011-03-12 ENCOUNTER — Encounter: Payer: Self-pay | Admitting: Internal Medicine

## 2011-03-12 VITALS — BP 110/60 | Temp 97.9°F | Ht 69.0 in | Wt 181.0 lb

## 2011-03-12 DIAGNOSIS — I2510 Atherosclerotic heart disease of native coronary artery without angina pectoris: Secondary | ICD-10-CM

## 2011-03-12 DIAGNOSIS — Z23 Encounter for immunization: Secondary | ICD-10-CM

## 2011-03-12 DIAGNOSIS — E119 Type 2 diabetes mellitus without complications: Secondary | ICD-10-CM

## 2011-03-12 DIAGNOSIS — Z0000 Encounter for general adult medical examination without abnormal findings: Secondary | ICD-10-CM

## 2011-03-12 DIAGNOSIS — I10 Essential (primary) hypertension: Secondary | ICD-10-CM

## 2011-03-12 DIAGNOSIS — I739 Peripheral vascular disease, unspecified: Secondary | ICD-10-CM

## 2011-03-12 MED ORDER — LISINOPRIL-HYDROCHLOROTHIAZIDE 20-12.5 MG PO TABS: 1.0000 | ORAL_TABLET | Freq: Every day | ORAL | Status: DC

## 2011-03-12 MED ORDER — OMEPRAZOLE 20 MG PO CPDR: 20.0000 mg | DELAYED_RELEASE_CAPSULE | Freq: Every day | ORAL | Status: DC

## 2011-03-12 MED ORDER — METFORMIN HCL 1000 MG PO TABS: 1000.0000 mg | ORAL_TABLET | Freq: Two times a day (BID) | ORAL | Status: DC

## 2011-03-12 MED ORDER — SIMVASTATIN 20 MG PO TABS: 20.0000 mg | ORAL_TABLET | Freq: Every day | ORAL | Status: DC

## 2011-03-12 MED ORDER — AMLODIPINE BESYLATE 5 MG PO TABS: 5.0000 mg | ORAL_TABLET | Freq: Every day | ORAL | Status: DC

## 2011-03-12 MED ORDER — GLIMEPIRIDE 2 MG PO TABS: 2.0000 mg | ORAL_TABLET | Freq: Every day | ORAL | Status: DC

## 2011-03-12 NOTE — Progress Notes (Signed)
Subjective:    Patient ID: Walter [name], male    DOB: 07/10/1946, 65 y.o.   MRN: [id number]  HPI 65 year old patient who is seen today for an annual health maintenance examination. He has a history of type 2 diabetes which has been well-controlled he has coronary artery and peripheral vascular occlusive disease which has been stable. Medical regimen reviewed. Laboratory studies were also reviewed. Hemoglobin A1c was at goal and LDL cholesterol less than 409. He denies any cardiopulmonary complaints. He did have colonoscopy in December of 2010    Review of Systems  Constitutional: Negative for fever, chills, activity change, appetite change and fatigue.  HENT: Negative for hearing loss, ear pain, congestion, rhinorrhea, sneezing, mouth sores, trouble swallowing, neck pain, neck stiffness, dental problem, voice change, sinus pressure and tinnitus.   Eyes: Negative for photophobia, pain, redness and visual disturbance.  Respiratory: Negative for apnea, cough, choking, chest tightness, shortness of breath and wheezing.   Cardiovascular: Negative for chest pain, palpitations and leg swelling.  Gastrointestinal: Negative for nausea, vomiting, abdominal pain, diarrhea, constipation, blood in stool, abdominal distention, anal bleeding and rectal pain.  Genitourinary: Negative for dysuria, urgency, frequency, hematuria, flank pain, decreased urine volume, discharge, penile swelling, scrotal swelling, difficulty urinating, genital sores and testicular pain.  Musculoskeletal: Negative for myalgias, back pain, joint swelling, arthralgias and gait problem.  Skin: Negative for color change, rash and wound.  Neurological: Negative for dizziness, tremors, seizures, syncope, facial asymmetry, speech difficulty, weakness, light-headedness, numbness and headaches.  Hematological: Negative for adenopathy. Does not bruise/bleed easily.  Psychiatric/Behavioral: Negative for suicidal ideas, hallucinations,  behavioral problems, confusion, sleep disturbance, self-injury, dysphoric mood, decreased concentration and agitation. The patient is not nervous/anxious.        Objective:   Physical Exam  Constitutional: He is oriented to person, place, and time. He appears well-developed and well-nourished. No distress.  HENT:  Head: Normocephalic and atraumatic.  Right Ear: External ear normal.  Left Ear: External ear normal.  Nose: Nose normal.  Mouth/Throat: Oropharynx is clear and moist.  Eyes: Conjunctivae and EOM are normal. Pupils are equal, round, and reactive to light. No scleral icterus.  Neck: Normal range of motion. Neck supple. No JVD present. No thyromegaly present.  Cardiovascular: Regular rhythm and normal heart sounds.  Exam reveals no gallop and no friction rub.   No murmur heard.      The left pedal pulses were full the right dorsalis pedis pulse was slightly diminished the right posterior tibial pulse was absent The right femoral pulse was diminished Bilateral femoral bruits noted  Pulmonary/Chest: Effort normal and breath sounds normal. He exhibits no tenderness.  Abdominal: Soft. Bowel sounds are normal. He exhibits no distension and no mass. There is no tenderness.  Genitourinary: Prostate normal and penis normal.  Musculoskeletal: Normal range of motion. He exhibits no edema and no tenderness.  Lymphadenopathy:    He has no cervical adenopathy.  Neurological: He is alert and oriented to person, place, and time. He has normal reflexes. No cranial nerve deficit. Coordination normal.  Skin: Skin is warm and dry. No rash noted.  Psychiatric: He has a normal mood and affect. His behavior is normal.          Assessment & Plan:   Annual clinical exam. Diabetes mellitus type 2 well controlled Coronary artery disease asymptomatic Peripheral vascular disease. No claudication Dyslipidemia well controlled on simvastatin  We'll recheck in 3 months Hemoglobin A1c will be  checked at that time  lifestyle issues discussed and encouraged

## 2011-03-12 NOTE — Patient Instructions (Signed)
Please check your hemoglobin A1c every 3 months    It is important that you exercise regularly, at least 20 minutes 3 to 4 times per week.  If you develop chest pain or shortness of breath seek  medical attention.  Limit your sodium (Salt) intake   

## 2011-03-19 NOTE — Cardiovascular Report (Signed)
NAME:  Walter [name], Walter [name]                        ACCOUNT NO.:  [id number]   MEDICAL RECORD NO.:  [id number]                   PATIENT TYPE:  OIB   LOCATION:  2867                                 FACILITY:  [facility]   PHYSICIAN:  Veneda Melter, M.D.                   DATE OF BIRTH:  1946/02/21   DATE OF PROCEDURE:  03/08/2003  DATE OF DISCHARGE:                              CARDIAC CATHETERIZATION   PROCEDURES:  1. Left heart catheterization.  2. Left ventriculogram.  3. Selective coronary angiography.  4. Intravascular ultrasound of the right coronary artery.  5. Percutaneous transluminal coronary angioplasty and stent placement mid     right coronary artery.   DIAGNOSES:  1. Single-vessel coronary artery disease.  2. Normal left ventricular systolic function.   HISTORY OF PRESENT ILLNESS:  The patient is a 65 year old gentleman with  diabetes mellitus and hypertension who presents with dyspnea on exertion and  substernal chest discomfort. The patient underwent stress imaging studies  showing moderate ischemia in the inferior wall. He is referred for further  assessment.   DESCRIPTION OF PROCEDURE:  Informed consent was obtained. The patient was  brought  to the cardiac catheterization laboratory. A 6 French sheath was  placed in the right femoral artery using the modified Seldinger technique. A  6 French JL-4 and JR-4 catheter was then used  to engage the left and right  coronary arteries. Selective angiography was performed in various  projections using manual injection of contrast. A 6 French pigtail catheter  was then advanced to the left ventricle3 and a left ventriculogram was  performed using power injection of contrast.   FINDINGS:  Initial finding are as follows:  1. Left main trunk was a large caliber vessel with mild irregularities.  2. Left anterior descending artery was a large caliber vessel comprised of a     major 1st diagonal branch in the mid section. The LAD has  mild disease of     30% in the mid and distal segments. The diagonal branch has mild disease     of 30% in the proximal segment.  3. Left circumflex artery. This is an medium caliber vessel that provides 2     marginal branches in the midsection. The AV circumflex had mild disease     of 20% to 30%. The marginal branches have mild disease of 20% to 30% as     well.  4. Ramus intermedius. This is a large caliber vessel prior to the     anterolateral wall. There is a focal narrowing of 30% in the midsection.  5. Right coronary artery. This is a dominant large caliber vessel comprised     of the descending coronary artery and posterior ventricular branch at the     terminal segment. The right coronary artery has hazy  sequential     narrowings of 50% and 60% in the midsection. ________  in the takeoff in     the RV marginal branch. The distal vessel has mild irregularities.  6. Left ventricular.  Normal end systolic and end diastolic dimensions.     Overall left ventricular function is well preserved. Ejection fraction     greater than 55%. No mitral regurgitation. Left ventricular pressure     120/85, aortic is 120/65, LVEDP equals 10.   By angiogram the mid right coronary lesions did not appear to be  significant, however, the patient had significant ischemia on cardiac study  and we felt that angiography may under represent the true extent of disease.  We thus elected to proceed with intravascular ultrasound of the right  coronary artery.   The patient was given 2500 units of heparin intravenously and a 6 [place]  hockey-stick guide with side-holes was used  to engage the right coronary  artery. A 0.014 support wire was advanced to the distal RCA and an  intravascular ultrasound was performed using automated pullback.   This showed the distal vessel to be a large caliber vessel of 4 mm. The mid  RCA then had severe disease with  sequential narrowings of at least 75% to  80%. There was  1 quadrant of calcium and positive remodeling was noted.  Residual lumen was just under 2 mm in diameter. With this result we elected  to proceed with percutaneous intervention to the  mid right coronary artery  using IVUS data to size the vessel and lesion length.   The patient was given additional heparin intravenously to maintain ACT at  approximately 300 seconds. He was given a total of  600 mg of Plavix orally.  A 4.0 x 28 mm Express 2 stent was then introduced and carefully positioned  in the mid RCA to cover the 2 lesions and deployed at 10 atmospheres for 30  seconds. A 4.0 x 20 mm Quantum Maverick balloon was then used to post dilate  the distal and proximal segments of the stent at 14 and 16 atmospheres  respectively for 30 seconds each. Repeat angiography was then performed  showing excellent result with no residual stenosis.   Intravascular ultrasound was then repeated showing full apposition of the  stent with the vessel wall, 4 mm in diameter, no residual stenosis. There  was no evidence of damage at the proximal or distal ends of the stent and  there was full coverage of the severe lesions. This was deemed a successful  result.   The guide catheter was then removed and the sheath was secured in position.  The patient tolerated the procedure well and was transferred to the floor  in stable condition.   FINAL RESULTS:  Successful PTCA and stent placement in the mid right  coronary artery with a direction of  sequential 75% narrowing to 0% with  placement of a 4.0 x 28 mm Express 2 stent.                                               Veneda Melter, M.D.    Melton Alar  D:  03/08/2003  T:  03/11/2003  Job:  [id number]   cc:   Rollene Rotunda, M.D.   Gordy Savers, M.D. [facility]

## 2011-03-19 NOTE — Discharge Summary (Signed)
NAME:  Walter [name], Walter [name]                        ACCOUNT NO.:  [id number]   MEDICAL RECORD NO.:  [id number]                   PATIENT TYPE:  OIB   LOCATION:  6533                                 FACILITY:  [facility]   PHYSICIAN:  Joellyn Rued, P.A. [facility]              DATE OF BIRTH:  27-Oct-1946   DATE OF ADMISSION:  03/08/2003  DATE OF DISCHARGE:  03/09/2003                           DISCHARGE SUMMARY - REFERRING   SUMMARY OF HISTORY:  Walter [name] is a 65 year old white male who underwent a  stress Cardiolite for multiple risk factors for coronary artery disease.  He  has not had any symptoms; however his stress Cardiolite showed inferior wall  ischemia and an EF of 54%.  He also has a history of non-insulin-dependent  diabetes, hyperlipidemia, hypertension and tobacco use.   LABORATORY DATA:  Pre-admission H&H was 14.4 and 42.8, normal indices,  platelets 215, WBC 7.7.  PT 12.4.  Sodium 139, potassium 3.9, BUN 11,  creatinine 0.7, glucose 142.  EKG showed normal sinus rhythm, probable early  repolarization, nonspecific ST-T wave changes, post catheterization.   H&H was 13.3 and 38.3, platelets 208.  Sodium 139, potassium 3.6, BUN 9,  creatinine 0.9, glucose 147.  CK-MB was within normal limits.   HOSPITAL COURSE:  Walter [name] was brought in for outpatient cardiac  catheterization.  This was performed by Dr. Chales Abrahams on 03/08/03.  According to  Dr. Urban Gibson note, he had 30% diagonal 1, 30% mid LAD, 30% mid ramus.  He had  75/75% lesion in the mid RCA, estimated to be 78% by IVUS.  EF was 55%.  Dr.  Chales Abrahams placed an express stent in the mid RCA reducing the lesions to 0%  without difficulty.   The patient was to remain on bedrest, he was ambulating without difficulty.  Catheterization site had a slight amount of ecchymosis.   Pharmacy reviewed the patient and felt that since the patient was on  Verapamil 240 and Zocor 40 that a substitution should be made for his  statin.  It was recommended  the maximum dose of Zocor in combination with  that, Verapamil, was 20 mg.   After review by Dr. Rubie Maid on May 7, it was felt that the patient could be  discharged home.  Prior to discharge, he was seen by cardiac rehab, and  arrangements for outpatient smoking cessation consult was obtained.   DISCHARGE DIAGNOSES:  1. Positive stress Cardiolite, status post stenting to the right coronary     artery.  2. Multiple risk factors, hypertension, diabetes, hyperlipidemia, tobacco     use.   DISPOSITION:  He was discharged home.   DISCHARGE MEDICATIONS:  1. Plavix 75 mg q.d. for one month.  2. Sublingual nitroglycerin as needed.  3. His Zocor was decreased to 20 mg p.o. q.h.s., per recommendations of     pharmacy.  4. He was asked to continue his Verapamil 240 mg q.d.  5. Resume his Glucophage 500 mg q.d. on Sunday.   ACTIVITIES:  He was advised no lifting, driving, sexual activity or heavy  exertion for two days.   DIET:  No salt, low-fat, cholesterol, ADA diet.   SPECIAL INSTRUCTIONS:  If he has any problems with his catheterization site,  he will call our office immediately.  He was advised no smoking or tobacco  products.   FOLLOW UP:  1. He will also call our office on Monday to arrange a follow up, 2-week     appointment, with Dr. Antoine Poche.  At the time of that followup, review of     above-listed medications should be performed.  2. Given history of hypertension, diabetes and coronary artery disease, he     may benefit from an ACE inhibitor.  3. Review of his lipids should also be performed as well as encouraging     aggressive cardiac risk factor modification.                                               Joellyn Rued, P.A. [facility]    EW/MEDQ  D:  03/09/2003  T:  03/10/2003  Job:  [id number]   cc:   Gordy Savers, M.D. [facility]   Rollene Rotunda, M.D.

## 2011-03-19 NOTE — Assessment & Plan Note (Signed)
[facility] OFFICE NOTE   Walter [name], Walter [name]                   MRN:          [id number]  DATE:08/24/2006                            DOB:          05/30/1946    A 65 year old gentleman seen today for a wellness exam.  He has coronary  artery disease, status post intervention in 2004.  He has type 2 diabetes,  hypercholesterolemia and hypertension.  He is a former smoker.  Other than  his cardiac intervention in 2004, no other hospital admissions.   REVIEW OF SYSTEMS:  Negative.  No cardiopulmonary complaints.  He did have a  screening sigmoidoscopy in December 2003.   FAMILY HISTORY:  Father deceased at 61 of pneumonia.  Mother had breast  cancer.  One brother, one sister remain well.   PHYSICAL EXAMINATION:  GENERAL:  He is a well-developed male in no acute  distress.  VITAL SIGNS:  Blood pressure was 124/64.  HEENT:  Fundi, ear, nose and throat clear.  NECK:  No bruits.  CHEST:  Clear.  CARDIOVASCULAR:  Normal heart sounds, no murmurs.  ABDOMEN:  Benign.  Did have a left femoral bruit.  GENITOURINARY:  External genitalia normal.  RECTAL:  Prostate +2 and benign.  EXTREMITIES:  No edema.  Peripheral pulses were diminished on the right,  especially the left posterior tibial pulse.  NEUROLOGIC:  Negative.   IMPRESSION:  1. Coronary artery disease.  2. Hypertension.  3. Hypercholesterolemia.   DISPOSITION:  Medical regimen unchanged, except his metformin increased to 1  g b.i.d.  We will recheck a hemoglobin A1c in 3 months.  Flu and Pneumovax  administered today.           ______________________________  Gordy Savers, MD    PFK/MedQ  DD:  08/24/2006  DT:  08/25/2006  Job #:  [id number]

## 2011-06-11 ENCOUNTER — Ambulatory Visit: Payer: 59 | Admitting: Internal Medicine

## 2011-06-14 ENCOUNTER — Ambulatory Visit (INDEPENDENT_AMBULATORY_CARE_PROVIDER_SITE_OTHER): Payer: 59 | Admitting: Internal Medicine

## 2011-06-14 ENCOUNTER — Encounter: Payer: Self-pay | Admitting: Internal Medicine

## 2011-06-14 DIAGNOSIS — E119 Type 2 diabetes mellitus without complications: Secondary | ICD-10-CM

## 2011-06-14 DIAGNOSIS — I2510 Atherosclerotic heart disease of native coronary artery without angina pectoris: Secondary | ICD-10-CM

## 2011-06-14 DIAGNOSIS — I10 Essential (primary) hypertension: Secondary | ICD-10-CM

## 2011-06-14 NOTE — Progress Notes (Signed)
  Subjective:    Patient ID: Walter [name], male    DOB: 02-Aug-1946, 65 y.o.   MRN: [id number]  HPI   65 year old patient who is seen today for followup. He has a history of type 2 diabetes which has been stable. His hemoglobin A1c 3 months ago was 6.6. He has treated hypertension and dyslipidemia. He was seen for his annual physical 3 months ago. Remained stable.    Review of Systems  Constitutional: Negative for fever, chills, appetite change and fatigue.  HENT: Negative for hearing loss, ear pain, congestion, sore throat, trouble swallowing, neck stiffness, dental problem, voice change and tinnitus.   Eyes: Negative for pain, discharge and visual disturbance.  Respiratory: Negative for cough, chest tightness, wheezing and stridor.   Cardiovascular: Negative for chest pain, palpitations and leg swelling.  Gastrointestinal: Negative for nausea, vomiting, abdominal pain, diarrhea, constipation, blood in stool and abdominal distention.  Genitourinary: Negative for urgency, hematuria, flank pain, discharge, difficulty urinating and genital sores.  Musculoskeletal: Negative for myalgias, back pain, joint swelling, arthralgias and gait problem.  Skin: Negative for rash.  Neurological: Negative for dizziness, syncope, speech difficulty, weakness, numbness and headaches.  Hematological: Negative for adenopathy. Does not bruise/bleed easily.  Psychiatric/Behavioral: Negative for behavioral problems and dysphoric mood. The patient is not nervous/anxious.        Objective:   Physical Exam  Constitutional: He is oriented to person, place, and time. He appears well-developed.  HENT:  Head: Normocephalic.  Right Ear: External ear normal.  Left Ear: External ear normal.  Eyes: Conjunctivae and EOM are normal.  Neck: Normal range of motion.  Cardiovascular: Normal rate and normal heart sounds.   Pulmonary/Chest: Breath sounds normal.  Abdominal: Bowel sounds are normal.  Musculoskeletal:  Normal range of motion. He exhibits no edema and no tenderness.  Neurological: He is alert and oriented to person, place, and time.  Psychiatric: He has a normal mood and affect. His behavior is normal.          Assessment & Plan:   Diabetes mellitus. Will continue present regimen. We'll check a hemoglobin A1c Hypertension. Well controlled Dyslipidemia. Will continue simvastatin 20 mg daily  Recheck 4 months

## 2011-06-14 NOTE — Patient Instructions (Signed)
Please check your hemoglobin A1c every 3 months   It is important that you exercise regularly, at least 20 minutes 3 to 4 times per week.  If you develop chest pain or shortness of breath seek  medical attention.  Limit your sodium (Salt) intake  Avoids foods high in acid such as tomatoes citrus juices, and spicy foods.  Avoid eating within two hours of lying down or before exercising.  Do not overheat.  Try smaller more frequent meals.  If symptoms persist, elevate the head of her bed 12 inches while sleeping.

## 2011-10-11 ENCOUNTER — Ambulatory Visit (INDEPENDENT_AMBULATORY_CARE_PROVIDER_SITE_OTHER): Payer: 59 | Admitting: Internal Medicine

## 2011-10-11 ENCOUNTER — Encounter: Payer: Self-pay | Admitting: Internal Medicine

## 2011-10-11 VITALS — BP 120/76 | Temp 97.6°F | Wt 184.0 lb

## 2011-10-11 DIAGNOSIS — I10 Essential (primary) hypertension: Secondary | ICD-10-CM

## 2011-10-11 DIAGNOSIS — I739 Peripheral vascular disease, unspecified: Secondary | ICD-10-CM

## 2011-10-11 DIAGNOSIS — Z2911 Encounter for prophylactic immunotherapy for respiratory syncytial virus (RSV): Secondary | ICD-10-CM

## 2011-10-11 DIAGNOSIS — I2510 Atherosclerotic heart disease of native coronary artery without angina pectoris: Secondary | ICD-10-CM

## 2011-10-11 DIAGNOSIS — Z0000 Encounter for general adult medical examination without abnormal findings: Secondary | ICD-10-CM

## 2011-10-11 DIAGNOSIS — E119 Type 2 diabetes mellitus without complications: Secondary | ICD-10-CM

## 2011-10-11 LAB — HEMOGLOBIN A1C: Hgb A1c MFr Bld: 6.6 % — ABNORMAL HIGH (ref 4.6–6.5)

## 2011-10-11 NOTE — Patient Instructions (Signed)
Limit your sodium (Salt) intake    It is important that you exercise regularly, at least 20 minutes 3 to 4 times per week.  If you develop chest pain or shortness of breath seek  medical attention.   Please check your hemoglobin A1c every 3 months   

## 2011-10-11 NOTE — Progress Notes (Signed)
  Subjective:    Patient ID: Walter [name], male    DOB: August 18, 1946, 65 y.o.   MRN: [id number]  HPI  65 year old patient who is seen today for followup. He has a history of type 2 diabetes that has been well-controlled. He has treated hypertension which has been stable. He has coronary artery disease and peripheral vascular disease which has been asymptomatic. No concerns or complaints. He denies any exertional chest pain or claudication    Review of Systems  Constitutional: Negative for fever, chills, appetite change and fatigue.  HENT: Negative for hearing loss, ear pain, congestion, sore throat, trouble swallowing, neck stiffness, dental problem, voice change and tinnitus.   Eyes: Negative for pain, discharge and visual disturbance.  Respiratory: Negative for cough, chest tightness, wheezing and stridor.   Cardiovascular: Negative for chest pain, palpitations and leg swelling.  Gastrointestinal: Negative for nausea, vomiting, abdominal pain, diarrhea, constipation, blood in stool and abdominal distention.  Genitourinary: Negative for urgency, hematuria, flank pain, discharge, difficulty urinating and genital sores.  Musculoskeletal: Negative for myalgias, back pain, joint swelling, arthralgias and gait problem.  Skin: Negative for rash.  Neurological: Negative for dizziness, syncope, speech difficulty, weakness, numbness and headaches.  Hematological: Negative for adenopathy. Does not bruise/bleed easily.  Psychiatric/Behavioral: Negative for behavioral problems and dysphoric mood. The patient is not nervous/anxious.        Objective:   Physical Exam  Constitutional: He is oriented to person, place, and time. He appears well-developed.  HENT:  Head: Normocephalic.  Right Ear: External ear normal.  Left Ear: External ear normal.  Eyes: Conjunctivae and EOM are normal.  Neck: Normal range of motion.  Cardiovascular: Normal rate and normal heart sounds.        Absent right  posterior tibial pulse  Pulmonary/Chest: Breath sounds normal.  Abdominal: Bowel sounds are normal.  Musculoskeletal: Normal range of motion. He exhibits no edema and no tenderness.  Neurological: He is alert and oriented to person, place, and time.  Psychiatric: He has a normal mood and affect. His behavior is normal.          Assessment & Plan:   Hypertension well controlled Type 2 diabetes. We'll check a hemoglobin A1c Dyslipidemia. Continue simvastatin PAD stable  We'll see in the spring for his annual physical. Medications refill

## 2011-10-12 ENCOUNTER — Other Ambulatory Visit: Payer: Self-pay | Admitting: Internal Medicine

## 2011-10-12 MED ORDER — LISINOPRIL-HYDROCHLOROTHIAZIDE 20-12.5 MG PO TABS: 1.0000 | ORAL_TABLET | Freq: Every day | ORAL | Status: DC

## 2011-10-12 NOTE — Telephone Encounter (Signed)
Pt requesting refill on lisinopril-hydrochlorothiazide (PRINZIDE,ZESTORETIC) 20-12.5 MG per tablet  [facility]

## 2011-10-12 NOTE — Telephone Encounter (Signed)
Should have refills alreadt there - but will resend.

## 2011-11-01 ENCOUNTER — Telehealth: Payer: Self-pay | Admitting: Family Medicine

## 2011-11-01 MED ORDER — HYDROCODONE-HOMATROPINE 5-1.5 MG/5ML PO SYRP: 5.0000 mL | ORAL_SOLUTION | Freq: Three times a day (TID) | ORAL | Status: AC | PRN

## 2011-11-01 NOTE — Telephone Encounter (Signed)
Please advise 

## 2011-11-01 NOTE — Telephone Encounter (Signed)
Hydromet 6 ounces 1 teaspoon  every 6 hours as needed for cough and congestion 

## 2011-11-01 NOTE — Telephone Encounter (Signed)
Patient had been sick with a virus - sore throat, head cold, achy, etc. Seems mostly better, but feels weak and can't quite kick it. Wants to know if we can call in something to the [facility] in [place]. Please call pt to advise.

## 2011-12-11 ENCOUNTER — Other Ambulatory Visit: Payer: Self-pay | Admitting: Internal Medicine

## 2012-02-08 ENCOUNTER — Telehealth: Payer: Self-pay | Admitting: Internal Medicine

## 2012-02-08 MED ORDER — SIMVASTATIN 40 MG PO TABS: 40.0000 mg | ORAL_TABLET | Freq: Every day | ORAL | Status: DC

## 2012-02-08 NOTE — Telephone Encounter (Signed)
Pt has questions about meds. Please call asap

## 2012-02-08 NOTE — Telephone Encounter (Signed)
Pt request simvastatin 40mg  and will cut in half to take current dose 20mg  - per insurance this will be much cheaper Please advise new rx for 40mg  qd

## 2012-02-08 NOTE — Telephone Encounter (Signed)
ok 

## 2012-03-06 ENCOUNTER — Other Ambulatory Visit (INDEPENDENT_AMBULATORY_CARE_PROVIDER_SITE_OTHER): Payer: 59

## 2012-03-06 DIAGNOSIS — Z0000 Encounter for general adult medical examination without abnormal findings: Secondary | ICD-10-CM

## 2012-03-06 LAB — CBC WITH DIFFERENTIAL/PLATELET
Basophils Relative: 0.4 % (ref 0.0–3.0)
Eosinophils Absolute: 0.2 10*3/uL (ref 0.0–0.7)
Eosinophils Relative: 2.8 % (ref 0.0–5.0)
Hemoglobin: 12.6 g/dL — ABNORMAL LOW (ref 13.0–17.0)
Lymphocytes Relative: 20 % (ref 12.0–46.0)
Neutrophils Relative %: 70 % (ref 43.0–77.0)

## 2012-03-06 LAB — BASIC METABOLIC PANEL
BUN: 17 mg/dL (ref 6–23)
CO2: 26 mEq/L (ref 19–32)
Calcium: 9.1 mg/dL (ref 8.4–10.5)
Chloride: 104 mEq/L (ref 96–112)
Creatinine, Ser: 1.3 mg/dL (ref 0.4–1.5)
Glucose, Bld: 98 mg/dL (ref 70–99)

## 2012-03-06 LAB — LIPID PANEL
LDL Cholesterol: 72 mg/dL (ref 0–99)
Total CHOL/HDL Ratio: 4

## 2012-03-06 LAB — POCT URINALYSIS DIPSTICK
Bilirubin, UA: NEGATIVE
Leukocytes, UA: NEGATIVE
Nitrite, UA: NEGATIVE
Urobilinogen, UA: 0.2
pH, UA: 5.5

## 2012-03-06 LAB — HEPATIC FUNCTION PANEL
AST: 22 U/L (ref 0–37)
Albumin: 4.1 g/dL (ref 3.5–5.2)
Alkaline Phosphatase: 72 U/L (ref 39–117)
Bilirubin, Direct: 0.1 mg/dL (ref 0.0–0.3)
Total Bilirubin: 0.5 mg/dL (ref 0.3–1.2)

## 2012-03-06 LAB — TSH: TSH: 0.52 u[IU]/mL (ref 0.35–5.50)

## 2012-03-06 LAB — HEMOGLOBIN A1C: Hgb A1c MFr Bld: 6.5 % (ref 4.6–6.5)

## 2012-03-06 LAB — MICROALBUMIN / CREATININE URINE RATIO: Microalb Creat Ratio: 0.4 mg/g (ref 0.0–30.0)

## 2012-03-06 LAB — PSA: PSA: 0.76 ng/mL (ref 0.10–4.00)

## 2012-03-13 ENCOUNTER — Encounter: Payer: Self-pay | Admitting: Internal Medicine

## 2012-03-13 ENCOUNTER — Ambulatory Visit (INDEPENDENT_AMBULATORY_CARE_PROVIDER_SITE_OTHER): Payer: 59 | Admitting: Internal Medicine

## 2012-03-13 VITALS — BP 118/64 | HR 68 | Temp 97.4°F | Resp 16 | Ht 69.5 in | Wt 179.0 lb

## 2012-03-13 DIAGNOSIS — I2510 Atherosclerotic heart disease of native coronary artery without angina pectoris: Secondary | ICD-10-CM

## 2012-03-13 DIAGNOSIS — Z0000 Encounter for general adult medical examination without abnormal findings: Secondary | ICD-10-CM

## 2012-03-13 DIAGNOSIS — I739 Peripheral vascular disease, unspecified: Secondary | ICD-10-CM

## 2012-03-13 DIAGNOSIS — I10 Essential (primary) hypertension: Secondary | ICD-10-CM

## 2012-03-13 DIAGNOSIS — K219 Gastro-esophageal reflux disease without esophagitis: Secondary | ICD-10-CM

## 2012-03-13 DIAGNOSIS — E119 Type 2 diabetes mellitus without complications: Secondary | ICD-10-CM

## 2012-03-13 MED ORDER — AMLODIPINE BESYLATE 5 MG PO TABS: 5.0000 mg | ORAL_TABLET | Freq: Every day | ORAL | Status: DC

## 2012-03-13 MED ORDER — LISINOPRIL-HYDROCHLOROTHIAZIDE 20-12.5 MG PO TABS: 1.0000 | ORAL_TABLET | Freq: Every day | ORAL | Status: DC

## 2012-03-13 MED ORDER — OMEPRAZOLE 40 MG PO CPDR: 40.0000 mg | DELAYED_RELEASE_CAPSULE | Freq: Every day | ORAL | Status: DC

## 2012-03-13 MED ORDER — GLIMEPIRIDE 4 MG PO TABS: 4.0000 mg | ORAL_TABLET | Freq: Every day | ORAL | Status: DC

## 2012-03-13 MED ORDER — SIMVASTATIN 40 MG PO TABS: 40.0000 mg | ORAL_TABLET | Freq: Every day | ORAL | Status: DC

## 2012-03-13 MED ORDER — METFORMIN HCL 1000 MG PO TABS: 1000.0000 mg | ORAL_TABLET | Freq: Two times a day (BID) | ORAL | Status: DC

## 2012-03-13 MED ORDER — ATORVASTATIN CALCIUM 40 MG PO TABS: 40.0000 mg | ORAL_TABLET | Freq: Every day | ORAL | Status: DC

## 2012-03-13 NOTE — Patient Instructions (Signed)
Limit your sodium (Salt) intake    It is important that you exercise regularly, at least 20 minutes 3 to 4 times per week.  If you develop chest pain or shortness of breath seek  medical attention.  Return in 6 months for follow-up  Please check your blood pressure on a regular basis.  If it is consistently greater than 150/90, please make an office appointment.  DEXILANT  one dailyDiet for GERD or PUD Nutrition therapy can help ease the discomfort of gastroesophageal reflux disease (GERD) and peptic ulcer disease (PUD).   HOME CARE INSTRUCTIONS    Eat your meals slowly, in a relaxed setting.   Eat 5 to 6 small meals per day.   If a food causes distress, stop eating it for a period of time.  FOODS TO AVOID  Coffee, regular or decaffeinated.   Cola beverages, regular or low calorie.   Tea, regular or decaffeinated.   Pepper.   Cocoa.   High fat foods, including meats.   Butter, margarine, hydrogenated oil (trans fats).   Peppermint or spearmint (if you have GERD).   Fruits and vegetables if not tolerated.   Alcohol.   Nicotine (smoking or chewing). This is one of the most potent stimulants to acid production in the gastrointestinal tract.   Any food that seems to aggravate your condition.  If you have questions regarding your diet, ask your caregiver or a registered dietitian. TIPS  Lying flat may make symptoms worse. Keep the head of your bed raised 6 to 9 inches (15 to 23 cm) by using a foam wedge or blocks under the legs of the bed.   Do not lay down until 3 hours after eating a meal.   Daily physical activity may help reduce symptoms.  MAKE SURE YOU:    Understand these instructions.   Will watch your condition.   Will get help right away if you are not doing well or get worse.  Document Released: 10/18/2005 Document Revised: 10/07/2011 Document Reviewed: 09/03/2011 [facility] Patient Information 2012 [place], [place].Gastroesophageal Reflux Disease,  Adult Gastroesophageal reflux disease (GERD) happens when acid from your stomach flows up into the esophagus. When acid comes in contact with the esophagus, the acid causes soreness (inflammation) in the esophagus. Over time, GERD may create small holes (ulcers) in the lining of the esophagus. CAUSES    Increased body weight. This puts pressure on the stomach, making acid rise from the stomach into the esophagus.   Smoking. This increases acid production in the stomach.   Drinking alcohol. This causes decreased pressure in the lower esophageal sphincter (valve or ring of muscle between the esophagus and stomach), allowing acid from the stomach into the esophagus.   Late evening meals and a full stomach. This increases pressure and acid production in the stomach.   A malformed lower esophageal sphincter.  Sometimes, no cause is found. SYMPTOMS    Burning pain in the lower part of the mid-chest behind the breastbone and in the mid-stomach area. This may occur twice a week or more often.   Trouble swallowing.   Sore throat.   Dry cough.   Asthma-like symptoms including chest tightness, shortness of breath, or wheezing.  DIAGNOSIS   Your caregiver may be able to diagnose GERD based on your symptoms. In some cases, X-rays and other tests may be done to check for complications or to check the condition of your stomach and esophagus. TREATMENT   Your caregiver may recommend over-the-counter or prescription medicines  to help decrease acid production. Ask your caregiver before starting or adding any new medicines.   HOME CARE INSTRUCTIONS    Change the factors that you can control. Ask your caregiver for guidance concerning weight loss, quitting smoking, and alcohol consumption.   Avoid foods and drinks that make your symptoms worse, such as:   Caffeine or alcoholic drinks.   Chocolate.   Peppermint or mint flavorings.   Garlic and onions.   Spicy foods.   Citrus fruits, such as  oranges, lemons, or limes.   Tomato-based foods such as sauce, chili, salsa, and pizza.   Fried and fatty foods.   Avoid lying down for the 3 hours prior to your bedtime or prior to taking a nap.   Eat small, frequent meals instead of large meals.   Wear loose-fitting clothing. Do not wear anything tight around your waist that causes pressure on your stomach.   Raise the head of your bed 6 to 8 inches with wood blocks to help you sleep. Extra pillows will not help.   Only take over-the-counter or prescription medicines for pain, discomfort, or fever as directed by your caregiver.   Do not take aspirin, ibuprofen, or other nonsteroidal anti-inflammatory drugs (NSAIDs).  SEEK IMMEDIATE MEDICAL CARE IF:    You have pain in your arms, neck, jaw, teeth, or back.   Your pain increases or changes in intensity or duration.   You develop nausea, vomiting, or sweating (diaphoresis).   You develop shortness of breath, or you faint.   Your vomit is green, yellow, black, or looks like coffee grounds or blood.   Your stool is red, bloody, or black.  These symptoms could be signs of other problems, such as heart disease, gastric bleeding, or esophageal bleeding. MAKE SURE YOU:    Understand these instructions.   Will watch your condition.   Will get help right away if you are not doing well or get worse.  Document Released: 07/28/2005 Document Revised: 10/07/2011 Document Reviewed: 05/07/2011 [facility] Patient Information 2012 [place], [place].

## 2012-03-13 NOTE — Progress Notes (Signed)
Subjective:    Patient ID: Walter [name], male    DOB: [birth year], 66 y.o.   MRN: [id number]  HPI  66 year old patient who is seen today for a health maintenance examination.  He has coronary artery disease and peripheral vascular disease which has been stable. Main complaint today is worsening reflux symptoms that interferes with sleep this has been no more aggravating for the past 2 months medical regimen includes Prilosec 20 mg daily He has type 2 diabetes which has been quite stable hemoglobin A1c 6.5. He has treated hypertension and a history of dyslipidemia. Remains on simvastatin 40 mg daily. His only concern is occasional "jumping type of movement"of his legs during the night this seems fairly mild.  Allergies:  No Known Drug Allergies   Past History:  Past Medical History:  Reviewed history from 05/07/2009 and no changes required.  Coronary artery disease  Diabetes mellitus, type II  Hypertension  Hypercholesterolemia  Peripheral vascular disease  GERD   Past Surgical History:   Angioplasty 2004  sigmoidoscopy 2003   Family History:  Reviewed history from 07/12/2007 and no changes required.  Family History Other cancer-Breast  father died age 55, pneumonia  mother history breast cancer  one brother one sister in good health   Social History:  Reviewed history from 03/13/2008 and no changes required.  Former Smoker  Married  exercises at [employer] 2-4 times per week  Retired  Colonoscopy December 2010   Review of Systems  Constitutional: Negative for fever, chills, activity change, appetite change and fatigue.  HENT: Negative for hearing loss, ear pain, congestion, rhinorrhea, sneezing, mouth sores, trouble swallowing, neck pain, neck stiffness, dental problem, voice change, sinus pressure and tinnitus.   Eyes: Negative for photophobia, pain, redness and visual disturbance.  Respiratory: Negative for apnea, cough, choking, chest tightness, shortness of breath  and wheezing.   Cardiovascular: Negative for chest pain, palpitations and leg swelling.  Gastrointestinal: Negative for nausea, vomiting, abdominal pain, diarrhea, constipation, blood in stool, abdominal distention, anal bleeding and rectal pain.  Genitourinary: Negative for dysuria, urgency, frequency, hematuria, flank pain, decreased urine volume, discharge, penile swelling, scrotal swelling, difficulty urinating, genital sores and testicular pain.  Musculoskeletal: Negative for myalgias, back pain, joint swelling, arthralgias and gait problem.  Skin: Negative for color change, rash and wound.  Neurological: Negative for dizziness, tremors, seizures, syncope, facial asymmetry, speech difficulty, weakness, light-headedness, numbness and headaches.  Hematological: Negative for adenopathy. Does not bruise/bleed easily.  Psychiatric/Behavioral: Negative for suicidal ideas, hallucinations, behavioral problems, confusion, sleep disturbance, self-injury, dysphoric mood, decreased concentration and agitation. The patient is not nervous/anxious.        Objective:   Physical Exam  Constitutional: He appears well-developed and well-nourished.  HENT:  Head: Normocephalic and atraumatic.  Right Ear: External ear normal.  Left Ear: External ear normal.  Nose: Nose normal.  Mouth/Throat: Oropharynx is clear and moist.  Eyes: Conjunctivae and EOM are normal. Pupils are equal, round, and reactive to light. No scleral icterus.  Neck: Normal range of motion. Neck supple. No JVD present. No thyromegaly present.  Cardiovascular: Regular rhythm and normal heart sounds.  Exam reveals no gallop and no friction rub.   No murmur heard.      Pedal pulses full except for an absent right posterior tibial pulse  Pulmonary/Chest: Effort normal and breath sounds normal. He exhibits no tenderness.  Abdominal: Soft. Bowel sounds are normal. He exhibits no distension and no mass. There is no tenderness.  Genitourinary:  Penis  normal.       Prostate +2 enlarged and benign  Musculoskeletal: Normal range of motion. He exhibits no edema and no tenderness.  Lymphadenopathy:    He has no cervical adenopathy.  Neurological: He is alert. He has normal reflexes. No cranial nerve deficit. Coordination normal.  Skin: Skin is warm and dry. No rash noted.  Psychiatric: He has a normal mood and affect. His behavior is normal.          Assessment & Plan:   Preventive health examination Coronary artery disease stable. We'll continue aggressive risk factor modification Hypertension well controlled Dyslipidemia controlled; will discontinue simvastatin and switched to Lipitor due to to amlodipine use Peripheral vascular disease. Remains asymptomatic  Worsening GERD. Options were discussed including GI referral. Have elected to try Dexilant and they more aggressive anti-reflux regimen. If he remains symptomatic will require upper panendoscopy  Possible mild restless leg syndrome. Symptoms are minor we'll continue to observe

## 2012-03-20 ENCOUNTER — Encounter: Payer: Self-pay | Admitting: Internal Medicine

## 2012-03-20 ENCOUNTER — Ambulatory Visit (INDEPENDENT_AMBULATORY_CARE_PROVIDER_SITE_OTHER): Payer: 59 | Admitting: Internal Medicine

## 2012-03-20 VITALS — BP 110/68 | Temp 97.5°F | Wt 180.0 lb

## 2012-03-20 DIAGNOSIS — E119 Type 2 diabetes mellitus without complications: Secondary | ICD-10-CM

## 2012-03-20 DIAGNOSIS — IMO0002 Reserved for concepts with insufficient information to code with codable children: Secondary | ICD-10-CM

## 2012-03-20 DIAGNOSIS — X58XXXA Exposure to other specified factors, initial encounter: Secondary | ICD-10-CM

## 2012-03-20 DIAGNOSIS — T148XXA Other injury of unspecified body region, initial encounter: Secondary | ICD-10-CM

## 2012-03-20 DIAGNOSIS — I10 Essential (primary) hypertension: Secondary | ICD-10-CM

## 2012-03-20 NOTE — Patient Instructions (Signed)
Call if you develop fever or any drainage from the wounds or worsening pain or erythema

## 2012-03-20 NOTE — Progress Notes (Signed)
  Subjective:    Patient ID: Walter [name], male    DOB: 08/31/1946, 66 y.o.   MRN: [id number]  HPI 66 year old patient who was involved in an accident 4 [place] ago [place]. He was seen at a local urgent care do to a laceration involving his right fourth and fifth fingertips. No lacerations were place but he is completing prophylactic antibiotic therapy with Cleocin. Wounds are healing nicely. He is applying antibiotic ointment and keep and the area dressed no drainage or significant pain    Review of Systems  Skin: Positive for wound.       Objective:   Physical Exam  Constitutional: He appears well-developed and well-nourished. No distress.  Skin:       2 lacerations involving the right fourth and fifth fingertips. Wound clean without signs of infection          Assessment & Plan:   Traumatic lacerations right fourth and fifth fingertips. We'll continue antibiotic therapy. He has approximately 48 more hours of oral antibiotics local wound care discussed. He'll call if he develops any signs of infection

## 2012-04-27 ENCOUNTER — Encounter: Payer: Self-pay | Admitting: Internal Medicine

## 2012-04-27 ENCOUNTER — Ambulatory Visit (INDEPENDENT_AMBULATORY_CARE_PROVIDER_SITE_OTHER): Payer: 59 | Admitting: Internal Medicine

## 2012-04-27 VITALS — BP 122/60 | HR 90 | Temp 98.6°F | Wt 175.0 lb

## 2012-04-27 DIAGNOSIS — R109 Unspecified abdominal pain: Secondary | ICD-10-CM

## 2012-04-27 DIAGNOSIS — K219 Gastro-esophageal reflux disease without esophagitis: Secondary | ICD-10-CM

## 2012-04-27 LAB — CBC WITH DIFFERENTIAL/PLATELET
Eosinophils Absolute: 0.1 10*3/uL (ref 0.0–0.7)
HCT: 38.7 % — ABNORMAL LOW (ref 39.0–52.0)
Lymphs Abs: 1.2 10*3/uL (ref 0.7–4.0)
MCHC: 33.3 g/dL (ref 30.0–36.0)
MCV: 85.5 fl (ref 78.0–100.0)
Monocytes Absolute: 0.8 10*3/uL (ref 0.1–1.0)
Neutrophils Relative %: 84.3 % — ABNORMAL HIGH (ref 43.0–77.0)
Platelets: 258 10*3/uL (ref 150.0–400.0)
RDW: 13.7 % (ref 11.5–14.6)

## 2012-04-27 LAB — AMYLASE: Amylase: 67 U/L (ref 27–131)

## 2012-04-27 MED ORDER — HYDROCODONE-ACETAMINOPHEN 5-500 MG PO TABS: 1.0000 | ORAL_TABLET | Freq: Three times a day (TID) | ORAL | Status: AC | PRN

## 2012-04-27 NOTE — Patient Instructions (Addendum)
Call or return to clinic prn if these symptoms worsen or fail to improve as anticipated.  Abdominal Pain Abdominal pain can be caused by many things. Your caregiver decides the seriousness of your pain by an examination and possibly blood tests and X-rays. Many cases can be observed and treated at home. Most abdominal pain is not caused by a disease and will probably improve without treatment. However, in many cases, more time must pass before a clear cause of the pain can be found. Before that point, it may not be known if you need more testing, or if hospitalization or surgery is needed. HOME CARE INSTRUCTIONS    Do not take laxatives unless directed by your caregiver.   Take pain medicine only as directed by your caregiver.   Only take over-the-counter or prescription medicines for pain, discomfort, or fever as directed by your caregiver.   Try a clear liquid diet (broth, tea, or water) for as long as directed by your caregiver. Slowly move to a bland diet as tolerated.  SEEK IMMEDIATE MEDICAL CARE IF:    The pain does not go away.   You have a fever.   You keep throwing up (vomiting).   The pain is felt only in portions of the abdomen. Pain in the right side could possibly be appendicitis. In an adult, pain in the left lower portion of the abdomen could be colitis or diverticulitis.   You pass bloody or black tarry stools.  MAKE SURE YOU:    Understand these instructions.   Will watch your condition.   Will get help right away if you are not doing well or get worse.  Document Released: 07/28/2005 Document Revised: 10/07/2011 Document Reviewed: 06/05/2008 [facility] Patient Information 2012 [place], [place].

## 2012-04-27 NOTE — Progress Notes (Signed)
  Subjective:    Patient ID: Walter [name], male    DOB: 1945-11-08, 66 y.o.   MRN: [id number]  HPI  66 year old patient who presents today with a chief complaint of abdominal pain.  5 days ago he was at a social event and made a considerable amount of barbecue and over 8 other foods as well. He later developed a considerable nausea without vomiting and felt generally unwell yesterday afternoon he developed periumbilical pain. There's been no further nausea or vomiting. He had a normal bowel movement yesterday but has eaten very little today. His chief complaint presently is midabdominal pain. There's been no fever He was treated with clindamycin after a laceration at the beach one month ago. Medical regimen includes metformin for diabetes When he was seen earlier for his physical he was felt to have more reflux symptoms and was placed on a more aggressive antireflux regimen and treated with short-term Dexilant     Review of Systems  Constitutional: Negative for fever, chills, appetite change and fatigue.  HENT: Negative for hearing loss, ear pain, congestion, sore throat, trouble swallowing, neck stiffness, dental problem, voice change and tinnitus.   Eyes: Negative for pain, discharge and visual disturbance.  Respiratory: Negative for cough, chest tightness, wheezing and stridor.   Cardiovascular: Negative for chest pain, palpitations and leg swelling.  Gastrointestinal: Positive for nausea and abdominal pain. Negative for vomiting, diarrhea, constipation, blood in stool and abdominal distention.  Genitourinary: Negative for urgency, hematuria, flank pain, discharge, difficulty urinating and genital sores.  Musculoskeletal: Negative for myalgias, back pain, joint swelling, arthralgias and gait problem.  Skin: Negative for rash.  Neurological: Negative for dizziness, syncope, speech difficulty, weakness, numbness and headaches.  Hematological: Negative for adenopathy. Does not bruise/bleed  easily.  Psychiatric/Behavioral: Negative for behavioral problems and dysphoric mood. The patient is not nervous/anxious.        Objective:   Physical Exam  Constitutional: He is oriented to person, place, and time. He appears well-developed.  HENT:  Head: Normocephalic.  Right Ear: External ear normal.  Left Ear: External ear normal.  Eyes: Conjunctivae and EOM are normal.  Neck: Normal range of motion.  Cardiovascular: Normal rate and normal heart sounds.   Pulmonary/Chest: Breath sounds normal.  Abdominal: Soft. Bowel sounds are normal. He exhibits no distension and no mass. There is no tenderness. There is no rebound and no guarding.       He describes pain in the periumbilical area. There is no tenderness or rebound noted. Bowel sounds were active  Musculoskeletal: Normal range of motion. He exhibits no edema and no tenderness.  Neurological: He is alert and oriented to person, place, and time.  Psychiatric: He has a normal mood and affect. His behavior is normal.          Assessment & Plan:   Abdominal pain.  We'll check a CBC and amylase. We'll place metformin therapy on hold and treat symptomatically. We'll continue anti-reflex regimen and PPI therapy

## 2012-04-28 ENCOUNTER — Telehealth: Payer: Self-pay | Admitting: Internal Medicine

## 2012-04-28 DIAGNOSIS — IMO0001 Reserved for inherently not codable concepts without codable children: Secondary | ICD-10-CM

## 2012-04-28 NOTE — Telephone Encounter (Signed)
Caller: [name]/Spouse; PCP: Eleonore Chiquito; ;  Call regarding Mckinnon Glick in Office On 04/27/12 for Stabbing Abdominal Pain that started on 04/21/12 after eating big meal at daughter's wedding. Reflux Sx have been Worse- Eating Tums All Day Long, having sleep sitting up. No diarrhea. Gets Choked At Times When He Eats; Blood Sugar has been higher later- checks twice daily. He did not eat yesterday at all d/t  stomach pain/Nausea. Blood sugar =129 this morning. Taking meds at directed. Given sample of Dexilamp took one last night and one this morning. Stomach pain better today and was able to eat, but feels weak. Last bm on 04/26/12 normal. Onset weakness last night-04/27/12 with nausea. Wife is wondering if Lipitor is worsening sx on Reflux - started on med- 04/17/12. She is wanting him to have Endoscopy. Also wanting to know results of lab work. Triage per Abdominal Pain and Weakness Protocol and Disp: Call Provider Immediately for "Abdominal discomfort AND has started new Rx..." and 'new onset generalized weakness". PLEASE SPEAK WITH DR. Amador Cunas AND GIVE [name] CALL.CB#: [phone]

## 2012-04-28 NOTE — Telephone Encounter (Signed)
Please arrange a GI consult for upper endoscopy due to to refractory reflux symptoms. Please send patient information concerning GERD

## 2012-05-11 ENCOUNTER — Telehealth: Payer: Self-pay | Admitting: Internal Medicine

## 2012-05-11 NOTE — Telephone Encounter (Signed)
Pt having problems with reflux, requesting sooner appt than 1st available. Pt scheduled to see Dr. Juanda Chance 05/30/12@9 :30am. Pt aware of appt date and time.

## 2012-05-12 ENCOUNTER — Encounter: Payer: Self-pay | Admitting: *Deleted

## 2012-05-30 ENCOUNTER — Ambulatory Visit (INDEPENDENT_AMBULATORY_CARE_PROVIDER_SITE_OTHER): Payer: 59 | Admitting: Internal Medicine

## 2012-05-30 ENCOUNTER — Other Ambulatory Visit (INDEPENDENT_AMBULATORY_CARE_PROVIDER_SITE_OTHER): Payer: 59

## 2012-05-30 ENCOUNTER — Encounter: Payer: Self-pay | Admitting: Internal Medicine

## 2012-05-30 VITALS — BP 114/60 | HR 72 | Ht 68.5 in | Wt 171.0 lb

## 2012-05-30 DIAGNOSIS — R1319 Other dysphagia: Secondary | ICD-10-CM

## 2012-05-30 DIAGNOSIS — K219 Gastro-esophageal reflux disease without esophagitis: Secondary | ICD-10-CM

## 2012-05-30 LAB — IBC PANEL
Iron: 79 ug/dL (ref 42–165)
Transferrin: 256.1 mg/dL (ref 212.0–360.0)

## 2012-05-30 MED ORDER — ESOMEPRAZOLE MAGNESIUM 40 MG PO CPDR: 40.0000 mg | DELAYED_RELEASE_CAPSULE | Freq: Two times a day (BID) | ORAL | Status: DC

## 2012-05-30 NOTE — Progress Notes (Signed)
Walter [name] 01/31/1946 MRN [id number]  History of Present Illness:  This is a 66-year-old, white male with chronic gastroesophageal reflux. He is refractory to Prilosec 40 mg at bedtime. He takes numerous Rolaids after meals and at bedtime and during the night. He has dysphagia to solids and liquids. He has a nocturnal cough and regurgitation. He had a recent episode of vomiting bilious material and sharp periumbilical abdominal pain. He denies any change in his bowel habits. A colonoscopy in December 2010 showed moderately severe diverticulosis of the left and right colon. He had mild anemia with a hemoglobin of 12.9 and hematocrit of 38.7 with an MCV of 85. He takes 81 mg of aspirin daily. He has been a diabetic for over 10 years. He is complaining of early satiety.   Past Medical History  Diagnosis Date  . GERD (gastroesophageal reflux disease)   . Hypertension   . Diabetes mellitus   . Peripheral vascular disease   . Coronary artery disease   . Diverticulosis    Past Surgical History  Procedure Date  . Coronary angioplasty with stent placement     reports that he has quit smoking. He has never used smokeless tobacco. He reports that he does not drink alcohol or use illicit drugs. family history includes Breast cancer in his mother.  There is no history of Colon cancer. No Known Allergies      Review of Systems: Positive for dysphagia negative for odynophagia. Positive for upper abdominal pain  The remainder of the 10 point ROS is negative except as outlined in H&P   Physical Exam: General appearance  Well developed, in no distress. Eyes- non icteric. HEENT nontraumatic, normocephalic. Mouth no lesions, tongue papillated, no cheilosis. Neck supple without adenopathy, thyroid not enlarged, no carotid bruits, no JVD. Lungs Clear to auscultation bilaterally. Cor normal S1, normal S2, regular rhythm, no murmur,  quiet precordium. Abdomen: Soft with minimal tenderness in  epigastrium. Normal active bowel sounds. Lower abdomen unremarkable. No scars no bruit. Rectal: Not done. Extremities no pedal edema. Skin no lesions. Neurological alert and oriented x 3. Psychological normal mood and affect.  Assessment and Plan:  Problem #1 66-year-old, male with chronic gastroesophageal reflux and signs of LPR. His dysphagia may be related to esophageal dysmotility secondary to diabetes or possibly due to a mild esophageal stricture . It seems to be refractory to Prilosec. We will give him samples of Nexium 40 mg twice a day and schedule him for an upper endoscopy and possible dilatation. He should follow antireflux measures. Because of mild anemia, we will obtain iron studies and a B12. He is up-to-date on his colonoscopy.   05/30/2012 Walter [name]  

## 2012-05-30 NOTE — Patient Instructions (Addendum)
You have been scheduled for an endoscopy with propofol. Please follow written instructions given to you at your visit today. If you use inhalers (even only as needed), please bring them with you on the day of your procedure. Your physician has requested that you go to the basement for the following lab work before leaving today: IBC, B12 We have given you samples of the following medication to take: Nexium twice daily CC: Dr Eleonore Chiquito

## 2012-06-02 ENCOUNTER — Telehealth: Payer: Self-pay | Admitting: *Deleted

## 2012-06-02 ENCOUNTER — Telehealth: Payer: Self-pay | Admitting: Internal Medicine

## 2012-06-02 MED ORDER — CYANOCOBALAMIN 1000 MCG/ML IJ SOLN: INTRAMUSCULAR | Status: DC

## 2012-06-02 NOTE — Telephone Encounter (Signed)
Message copied by Daphine Deutscher on Fri Jun 02, 2012  4:14 PM ------      Message from: Hart Carwin      Created: Wed May 31, 2012  8:52 PM       Please call pt with very low B12 level, please start B12 injections 1000ug IM right away, then weekly x 4 then monthly x 6 months, then recheck B12 levels

## 2012-06-02 NOTE — Telephone Encounter (Signed)
error 

## 2012-06-02 NOTE — Telephone Encounter (Signed)
Spoke with wife and patient scheduled for B 12 injection on 06/05/12 at 4:30 PM. Wife will come and then she will do injections at home after this. Rx sent to pharmacy

## 2012-06-05 ENCOUNTER — Telehealth: Payer: Self-pay | Admitting: Internal Medicine

## 2012-06-05 ENCOUNTER — Ambulatory Visit (INDEPENDENT_AMBULATORY_CARE_PROVIDER_SITE_OTHER): Payer: 59 | Admitting: Internal Medicine

## 2012-06-05 DIAGNOSIS — E538 Deficiency of other specified B group vitamins: Secondary | ICD-10-CM

## 2012-06-05 MED ORDER — CYANOCOBALAMIN 1000 MCG/ML IJ SOLN
1000.0000 ug | Freq: Once | INTRAMUSCULAR | Status: AC
  Administered 2012-06-05: 1000 ug via INTRAMUSCULAR

## 2012-06-05 NOTE — Telephone Encounter (Signed)
This was done by dr. Regino Schultze office.

## 2012-06-05 NOTE — Telephone Encounter (Signed)
Spoke with Walter [name] he is aware that the B12 was drawn at [facility] and that office can be contacted for the results

## 2012-06-05 NOTE — Telephone Encounter (Signed)
Pt requesting results of labs from 7/30. Please contact

## 2012-06-05 NOTE — Telephone Encounter (Signed)
Caller: [name]/Spouse; PCP: Eleonore Chiquito; CB#: [phone]; ; ; Call regarding Calling About Labs; she was calling in regard to his B12 level being low and wants to know what it was when he was in office 04-27-12  I looked at labs in Epic and told her a CBC and amylase were done at that visit but not a B12 level

## 2012-06-07 ENCOUNTER — Ambulatory Visit (HOSPITAL_COMMUNITY)
Admission: RE | Admit: 2012-06-07 | Discharge: 2012-06-07 | Disposition: A | Discharge status: Home / Self Care | Payer: Medicare Other | Source: Ambulatory Visit | Attending: Internal Medicine | Admitting: Internal Medicine

## 2012-06-07 ENCOUNTER — Encounter (HOSPITAL_COMMUNITY)
Admission: RE | Disposition: A | Discharge status: Home / Self Care | Payer: Self-pay | Source: Ambulatory Visit | Attending: Internal Medicine

## 2012-06-07 ENCOUNTER — Encounter (HOSPITAL_COMMUNITY): Payer: Self-pay

## 2012-06-07 DIAGNOSIS — E119 Type 2 diabetes mellitus without complications: Secondary | ICD-10-CM | POA: Insufficient documentation

## 2012-06-07 DIAGNOSIS — E538 Deficiency of other specified B group vitamins: Secondary | ICD-10-CM

## 2012-06-07 DIAGNOSIS — K222 Esophageal obstruction: Secondary | ICD-10-CM

## 2012-06-07 DIAGNOSIS — I2510 Atherosclerotic heart disease of native coronary artery without angina pectoris: Secondary | ICD-10-CM | POA: Insufficient documentation

## 2012-06-07 DIAGNOSIS — I739 Peripheral vascular disease, unspecified: Secondary | ICD-10-CM | POA: Insufficient documentation

## 2012-06-07 DIAGNOSIS — I10 Essential (primary) hypertension: Secondary | ICD-10-CM | POA: Insufficient documentation

## 2012-06-07 DIAGNOSIS — R1319 Other dysphagia: Secondary | ICD-10-CM

## 2012-06-07 DIAGNOSIS — Z79899 Other long term (current) drug therapy: Secondary | ICD-10-CM | POA: Insufficient documentation

## 2012-06-07 DIAGNOSIS — K219 Gastro-esophageal reflux disease without esophagitis: Secondary | ICD-10-CM

## 2012-06-07 DIAGNOSIS — K2940 Chronic atrophic gastritis without bleeding: Secondary | ICD-10-CM | POA: Insufficient documentation

## 2012-06-07 DIAGNOSIS — K2980 Duodenitis without bleeding: Secondary | ICD-10-CM | POA: Insufficient documentation

## 2012-06-07 DIAGNOSIS — K449 Diaphragmatic hernia without obstruction or gangrene: Secondary | ICD-10-CM | POA: Insufficient documentation

## 2012-06-07 HISTORY — PX: ESOPHAGOGASTRODUODENOSCOPY: SHX5428

## 2012-06-07 LAB — GLUCOSE, CAPILLARY: Glucose-Capillary: 120 mg/dL — ABNORMAL HIGH (ref 70–99)

## 2012-06-07 SURGERY — EGD (ESOPHAGOGASTRODUODENOSCOPY)
Anesthesia: Moderate Sedation

## 2012-06-07 MED ORDER — FENTANYL CITRATE 0.05 MG/ML IJ SOLN
INTRAMUSCULAR | Status: AC
  Filled 2012-06-07: qty 4

## 2012-06-07 MED ORDER — BUTAMBEN-TETRACAINE-BENZOCAINE 2-2-14 % EX AERO
INHALATION_SPRAY | CUTANEOUS | Status: DC | PRN
  Administered 2012-06-07: 2 via TOPICAL

## 2012-06-07 MED ORDER — SODIUM CHLORIDE 0.9 % IV SOLN
INTRAVENOUS | Status: DC
  Administered 2012-06-07: 10:00:00 via INTRAVENOUS

## 2012-06-07 MED ORDER — FENTANYL CITRATE 0.05 MG/ML IJ SOLN
INTRAMUSCULAR | Status: DC | PRN
  Administered 2012-06-07 (×2): 25 ug via INTRAVENOUS

## 2012-06-07 MED ORDER — DIPHENHYDRAMINE HCL 50 MG/ML IJ SOLN
INTRAMUSCULAR | Status: AC
  Filled 2012-06-07: qty 1

## 2012-06-07 MED ORDER — MIDAZOLAM HCL 10 MG/2ML IJ SOLN
INTRAMUSCULAR | Status: AC
  Filled 2012-06-07: qty 4

## 2012-06-07 MED ORDER — MIDAZOLAM HCL 10 MG/2ML IJ SOLN
INTRAMUSCULAR | Status: DC | PRN
  Administered 2012-06-07: 2 mg via INTRAVENOUS
  Administered 2012-06-07: 1 mg via INTRAVENOUS

## 2012-06-07 MED ORDER — ESOMEPRAZOLE MAGNESIUM 40 MG PO CPDR: 40.0000 mg | DELAYED_RELEASE_CAPSULE | Freq: Every day | ORAL | Status: DC

## 2012-06-07 NOTE — Interval H&P Note (Signed)
History and Physical Interval Note:  06/07/2012 10:32 AM  Walter [name]  has presented today for surgery, with the diagnosis of GERD  The various methods of treatment have been discussed with the patient and family. After consideration of risks, benefits and other options for treatment, the patient has consented to  Procedure(s) (LRB): ESOPHAGOGASTRODUODENOSCOPY (EGD) (N/A) as a surgical intervention .  The patient's history has been reviewed, patient examined, no change in status, stable for surgery.  I have reviewed the patient's chart and labs.  Questions were answered to the patient's satisfaction.     Lina Sar

## 2012-06-07 NOTE — Interval H&P Note (Signed)
History and Physical Interval Note:  06/07/2012 8:47 AM  Walter [name]  has presented today for surgery, with the diagnosis of GERD  The various methods of treatment have been discussed with the patient and family. After consideration of risks, benefits and other options for treatment, the patient has consented to  Procedure(s) (LRB): ESOPHAGOGASTRODUODENOSCOPY (EGD) (N/A) as a surgical intervention .  The patient's history has been reviewed, patient examined, no change in status, stable for surgery.  I have reviewed the patient's chart and labs.  Questions were answered to the patient's satisfaction.     Lina Sar

## 2012-06-07 NOTE — H&P (View-Only) (Signed)
Walter [name] Apr 18, 1946 MRN [id number]  History of Present Illness:  This is a 66 year old, white male with chronic gastroesophageal reflux. He is refractory to Prilosec 40 mg at bedtime. He takes numerous Rolaids after meals and at bedtime and during the night. He has dysphagia to solids and liquids. He has a nocturnal cough and regurgitation. He had a recent episode of vomiting bilious material and sharp periumbilical abdominal pain. He denies any change in his bowel habits. A colonoscopy in December 2010 showed moderately severe diverticulosis of the left and right colon. He had mild anemia with a hemoglobin of 12.9 and hematocrit of 38.7 with an MCV of 85. He takes 81 mg of aspirin daily. He has been a diabetic for over 10 years. He is complaining of early satiety.   Past Medical History  Diagnosis Date  . GERD (gastroesophageal reflux disease)   . Hypertension   . Diabetes mellitus   . Peripheral vascular disease   . Coronary artery disease   . Diverticulosis    Past Surgical History  Procedure Date  . Coronary angioplasty with stent placement     reports that he has quit smoking. He has never used smokeless tobacco. He reports that he does not drink alcohol or use illicit drugs. family history includes Breast cancer in his mother.  There is no history of Colon cancer. No Known Allergies      Review of Systems: Positive for dysphagia negative for odynophagia. Positive for upper abdominal pain  The remainder of the 10 point ROS is negative except as outlined in H&P   Physical Exam: General appearance  Well developed, in no distress. Eyes- non icteric. HEENT nontraumatic, normocephalic. Mouth no lesions, tongue papillated, no cheilosis. Neck supple without adenopathy, thyroid not enlarged, no carotid bruits, no JVD. Lungs Clear to auscultation bilaterally. Cor normal S1, normal S2, regular rhythm, no murmur,  quiet precordium. Abdomen: Soft with minimal tenderness in  epigastrium. Normal active bowel sounds. Lower abdomen unremarkable. No scars no bruit. Rectal: Not done. Extremities no pedal edema. Skin no lesions. Neurological alert and oriented x 3. Psychological normal mood and affect.  Assessment and Plan:  Problem #26 66 year old, male with chronic gastroesophageal reflux and signs of LPR. His dysphagia may be related to esophageal dysmotility secondary to diabetes or possibly due to a mild esophageal stricture . It seems to be refractory to Prilosec. We will give him samples of Nexium 40 mg twice a day and schedule him for an upper endoscopy and possible dilatation. He should follow antireflux measures. Because of mild anemia, we will obtain iron studies and a B12. He is up-to-date on his colonoscopy.   05/30/2012 Lina Sar

## 2012-06-07 NOTE — Op Note (Signed)
[facility] [address] [place], [place]  [address]  ENDOSCOPY PROCEDURE REPORT  PATIENT:  [name], Walter  MR#:  [id number] BIRTHDATE:  [birth year], 66 yrs. old  GENDER:  male  ENDOSCOPIST:  Hedwig Morton. Juanda Chance, MD Referred by:  Eleonore Chiquito, M.D.  PROCEDURE DATE:  06/07/2012 PROCEDURE:  EGD with biopsy, 43239, EGD with dilatation over guidewire ASA CLASS:  Class II INDICATIONS:  dysphagia, GERD  MEDICATIONS:   These medications were titrated to patient response per physician's verbal order, Versed 3 mg, Fentanyl 50 mcg TOPICAL ANESTHETIC:  Cetacaine Spray  DESCRIPTION OF PROCEDURE:   After the risks benefits and alternatives of the procedure were thoroughly explained, informed consent was obtained.  The 110536 endoscope was introduced through the mouth and advanced to the second portion of the duodenum, without limitations.  The instrument was slowly withdrawn as the mucosa was fully examined. <<PROCEDUREIMAGES>>  A hiatal hernia was found (see image1). 3 cm nonreducible hiatal hernia,  A stricture was found in the distal esophagus (see image1 and image8). nonobstructing fibrous srticture Savary dilation over a guidewire 16 and 17 mm dilators passed over the guidewire Otherwise the examination was normal. With standard forceps, a biopsy was obtained and sent to pathology. small bowl biopsy and gastric biopsy to assess B12 deficiency    Retroflexed views revealed no abnormalities.    The scope was then withdrawn from the patient and the procedure completed.  COMPLICATIONS:  None  ENDOSCOPIC IMPRESSION: 1) Hiatal hernia 2) Stricture in the distal esophagus 3) Otherwise normal examination s/p passage of 16 and 17 mm dilator RECOMMENDATIONS: 1) Await biopsy results 2) Anti-reflux regimen to be follow dysphagia likely due to dismotility continue Nexiem 40 mg/day continue B12 injections  REPEAT EXAM:  No  ______________________________ Hedwig Morton. Juanda Chance,  MD  CC:  n. eSIGNED:   Hedwig Morton. Edward Guthmiller at 06/07/2012 11:04 AM  Rosezetta Schlatter, [phone]

## 2012-06-08 ENCOUNTER — Encounter (HOSPITAL_COMMUNITY): Payer: Self-pay | Admitting: Internal Medicine

## 2012-06-08 ENCOUNTER — Encounter (HOSPITAL_COMMUNITY): Payer: Self-pay

## 2012-06-08 ENCOUNTER — Encounter: Payer: Self-pay | Admitting: Internal Medicine

## 2012-06-08 ENCOUNTER — Telehealth: Payer: Self-pay | Admitting: *Deleted

## 2012-06-08 MED ORDER — DEXLANSOPRAZOLE 60 MG PO CPDR: 60.0000 mg | DELAYED_RELEASE_CAPSULE | Freq: Every day | ORAL | Status: DC

## 2012-06-08 NOTE — Telephone Encounter (Signed)
We have gotten a note from patient's pharmacy that insurance will not cover Nexium without a prior authorization. I have contacted [facility] [facility] (patient's insurance medicine provider) and they state that only Nexium powder is covered on patient's plan, not the capsule form of Nexium. They will cover Dexilant or Aciphex. I have sent a new prescription for Dexilant to patient's pharmacy and have left message on patient's voicemail explaining the above.

## 2012-09-13 ENCOUNTER — Ambulatory Visit: Payer: 59 | Admitting: Internal Medicine

## 2012-09-18 ENCOUNTER — Encounter: Payer: Self-pay | Admitting: Internal Medicine

## 2012-09-18 ENCOUNTER — Other Ambulatory Visit: Payer: Self-pay | Admitting: *Deleted

## 2012-09-18 ENCOUNTER — Ambulatory Visit (INDEPENDENT_AMBULATORY_CARE_PROVIDER_SITE_OTHER): Payer: 59 | Admitting: Internal Medicine

## 2012-09-18 VITALS — BP 140/80 | HR 69 | Temp 97.5°F | Resp 16 | Wt 175.0 lb

## 2012-09-18 DIAGNOSIS — R5383 Other fatigue: Secondary | ICD-10-CM

## 2012-09-18 DIAGNOSIS — E538 Deficiency of other specified B group vitamins: Secondary | ICD-10-CM

## 2012-09-18 DIAGNOSIS — I10 Essential (primary) hypertension: Secondary | ICD-10-CM

## 2012-09-18 DIAGNOSIS — I2510 Atherosclerotic heart disease of native coronary artery without angina pectoris: Secondary | ICD-10-CM

## 2012-09-18 DIAGNOSIS — R5381 Other malaise: Secondary | ICD-10-CM

## 2012-09-18 DIAGNOSIS — I739 Peripheral vascular disease, unspecified: Secondary | ICD-10-CM

## 2012-09-18 DIAGNOSIS — E119 Type 2 diabetes mellitus without complications: Secondary | ICD-10-CM

## 2012-09-18 LAB — COMPREHENSIVE METABOLIC PANEL
Albumin: 4.2 g/dL (ref 3.5–5.2)
CO2: 28 mEq/L (ref 19–32)
Calcium: 9.8 mg/dL (ref 8.4–10.5)
Chloride: 105 mEq/L (ref 96–112)
GFR: 67.34 mL/min (ref >60.00)
Glucose, Bld: 69 mg/dL — ABNORMAL LOW (ref 70–99)
Potassium: 4.8 mEq/L (ref 3.5–5.1)
Sodium: 140 mEq/L (ref 135–145)
Total Protein: 7.6 g/dL (ref 6.0–8.3)

## 2012-09-18 LAB — CBC WITH DIFFERENTIAL/PLATELET
Basophils Absolute: 0 10*3/uL (ref 0.0–0.1)
HCT: 38.1 % — ABNORMAL LOW (ref 39.0–52.0)
Lymphocytes Relative: 23.5 % (ref 12.0–46.0)
Lymphs Abs: 1.7 10*3/uL (ref 0.7–4.0)
Monocytes Relative: 7.8 % (ref 3.0–12.0)
Neutrophils Relative %: 65.5 % (ref 43.0–77.0)
Platelets: 221 10*3/uL (ref 150.0–400.0)
RDW: 13.9 % (ref 11.5–14.6)

## 2012-09-18 LAB — HEMOGLOBIN A1C: Hgb A1c MFr Bld: 6.8 % — ABNORMAL HIGH (ref 4.6–6.5)

## 2012-09-18 LAB — CK: Total CK: 65 U/L (ref 7–232)

## 2012-09-18 MED ORDER — CYANOCOBALAMIN 1000 MCG/ML IJ SOLN: 1000.0000 ug | INTRAMUSCULAR | Status: DC

## 2012-09-18 NOTE — Progress Notes (Signed)
Subjective:    Patient ID: Walter [name], male    DOB: 02/14/1946, 66 y.o.   MRN: [id number]  HPI  66 year old patient who is seen today for followup. For the past several months she has had some exertional fatigue and decreased exercise capacity. He states that he does well on a level in: But becomes quite fatigued when walking up hill. Denies any exertional chest pain or shortness of breath. His chief complaint is impaired exercise capacity. He does have a history of coronary artery disease and is status post PCI in 2004. He does have a history of B12 deficiency and has been on monthly B12 injections. He has type 2 diabetes. Last hemoglobin A1c 6.5. Remains on Lipitor.   Past Medical History  Diagnosis Date  . GERD (gastroesophageal reflux disease)   . Hypertension   . Diabetes mellitus   . Peripheral vascular disease   . Coronary artery disease   . Diverticulosis   . Esophageal stricture     History   Social History  . Marital Status: Married    Spouse Name: N/A    Number of Children: N/A  . Years of Education: N/A   Occupational History  . Not on file.   Social History Main Topics  . Smoking status: Former Games developer  . Smokeless tobacco: Never Used  . Alcohol Use: No  . Drug Use: No  . Sexually Active: Not on file   Other Topics Concern  . Not on file   Social History Narrative   Daily caffeine     Past Surgical History  Procedure Date  . Coronary angioplasty with stent placement   . Shoulder surgery 2006  . Esophagogastroduodenoscopy 06/07/2012    Procedure: ESOPHAGOGASTRODUODENOSCOPY (EGD);  Surgeon: Hart Carwin, MD;  Location: [facility];  Service: Endoscopy;  Laterality: N/A;    Family History  Problem Relation Age of Onset  . Breast cancer Mother   . Colon cancer Neg Hx     No Known Allergies  Current Outpatient Prescriptions on File Prior to Visit  Medication Sig Dispense Refill  . amLODipine (NORVASC) 5 MG tablet Take 1 tablet (5 mg total) by  mouth daily.  90 tablet  6  . aspirin 81 MG tablet Take 81 mg by mouth daily.        Marland Kitchen atorvastatin (LIPITOR) 40 MG tablet Take 1 tablet (40 mg total) by mouth daily.  90 tablet  3  . atorvastatin (LIPITOR) 40 MG tablet Take 40 mg by mouth daily.      . cyanocobalamin (,VITAMIN B-12,) 1000 MCG/ML injection 1000 mcg IM weekly x 3 then monthly x 6. Please dispense needles and syringes also.  10 mL  0  . Dihydroxyaluminum Sod Carb (ROLAIDS PO) Take by mouth as needed.      Marland Kitchen glimepiride (AMARYL) 4 MG tablet Take 1 tablet (4 mg total) by mouth daily before breakfast.  90 tablet  6  . lisinopril-hydrochlorothiazide (PRINZIDE,ZESTORETIC) 20-12.5 MG per tablet Take 1 tablet by mouth daily.  90 tablet  6  . metFORMIN (GLUCOPHAGE) 1000 MG tablet Take 1 tablet (1,000 mg total) by mouth 2 (two) times daily with a meal.  180 tablet  6  . omeprazole (PRILOSEC) 40 MG capsule Take 1 capsule (40 mg total) by mouth daily.  30 capsule  3  . dexlansoprazole (DEXILANT) 60 MG capsule Take 1 capsule (60 mg total) by mouth daily.  30 capsule  3    BP 140/80  Pulse 69  Temp 97.5 F (36.4 C) (Oral)  Resp 16  Wt 175 lb (79.379 kg)       Review of Systems  Constitutional: Positive for fatigue. Negative for fever, chills and appetite change.  HENT: Negative for hearing loss, ear pain, congestion, sore throat, trouble swallowing, neck stiffness, dental problem, voice change and tinnitus.   Eyes: Negative for pain, discharge and visual disturbance.  Respiratory: Negative for cough, chest tightness, wheezing and stridor.   Cardiovascular: Negative for chest pain, palpitations and leg swelling.  Gastrointestinal: Negative for nausea, vomiting, abdominal pain, diarrhea, constipation, blood in stool and abdominal distention.  Genitourinary: Negative for urgency, hematuria, flank pain, discharge, difficulty urinating and genital sores.  Musculoskeletal: Negative for myalgias, back pain, joint swelling, arthralgias and  gait problem.  Skin: Negative for rash.  Neurological: Negative for dizziness, syncope, speech difficulty, weakness, numbness and headaches.  Hematological: Negative for adenopathy. Does not bruise/bleed easily.  Psychiatric/Behavioral: Negative for behavioral problems and dysphoric mood. The patient is not nervous/anxious.        Objective:   Physical Exam  Constitutional: He is oriented to person, place, and time. He appears well-developed.  HENT:  Head: Normocephalic.  Right Ear: External ear normal.  Left Ear: External ear normal.  Eyes: Conjunctivae normal and EOM are normal.  Neck: Normal range of motion.  Cardiovascular: Normal rate and normal heart sounds.   Pulmonary/Chest: Breath sounds normal.  Abdominal: Bowel sounds are normal.  Musculoskeletal: Normal range of motion. He exhibits no edema and no tenderness.  Neurological: He is alert and oriented to person, place, and time.  Psychiatric: He has a normal mood and affect. His behavior is normal.          Assessment & Plan:    coronary artery disease with a decreased exercise capacity. We'll set up stress test for risk stratification Hypertension stable  Dyslipidemia  Type 2 diabetes. We'll check a hemoglobin A1c. We'll also check updated lab including CPK

## 2012-09-18 NOTE — Patient Instructions (Addendum)
Limit your sodium (Salt) intake   Please check your hemoglobin A1c every 3 months   Please check your blood pressure on a regular basis.  If it is consistently greater than 150/90, please make an office appointment.  Return in 4 months for follow-up

## 2012-09-25 ENCOUNTER — Ambulatory Visit (HOSPITAL_COMMUNITY): Payer: 59 | Attending: Internal Medicine | Admitting: Radiology

## 2012-09-25 VITALS — BP 137/60 | Ht 69.0 in | Wt 171.0 lb

## 2012-09-25 DIAGNOSIS — R5383 Other fatigue: Secondary | ICD-10-CM

## 2012-09-25 DIAGNOSIS — R5381 Other malaise: Secondary | ICD-10-CM | POA: Insufficient documentation

## 2012-09-25 DIAGNOSIS — I2510 Atherosclerotic heart disease of native coronary artery without angina pectoris: Secondary | ICD-10-CM | POA: Insufficient documentation

## 2012-09-25 MED ORDER — TECHNETIUM TC 99M SESTAMIBI GENERIC - CARDIOLITE
10.2000 | Freq: Once | INTRAVENOUS | Status: AC | PRN
  Administered 2012-09-25: 10 via INTRAVENOUS

## 2012-09-25 MED ORDER — TECHNETIUM TC 99M SESTAMIBI GENERIC - CARDIOLITE
32.3000 | Freq: Once | INTRAVENOUS | Status: AC | PRN
  Administered 2012-09-25: 32.3 via INTRAVENOUS

## 2012-09-25 MED ORDER — REGADENOSON 0.4 MG/5ML IV SOLN
0.4000 mg | Freq: Once | INTRAVENOUS | Status: AC
  Administered 2012-09-25: 0.4 mg via INTRAVENOUS

## 2012-09-25 NOTE — Progress Notes (Signed)
[name] SITE 3 NUCLEAR MED [id number] [place] [address] [phone]  Cardiology Nuclear Med Study  Walter [name] is a 66 y.o. male     MRN : [id number]     DOB: 1946/05/18  Procedure Date: 09/25/2012  Nuclear Med Background Indication for Stress Test:  Evaluation for Ischemia, Stent Patency and PTCA Patency History:  2004 MPS: moderate ischemia inferior wall EF: 54% heart cath: EF: 55% Single vessel Dz RCA 75% N/O CAD  LAD,CFX,RI  Angioplasty: RCA Stents Cardiac Risk Factors: Family History - CAD, History of Smoking, Hypertension, Lipids, NIDDM and PVD  Symptoms:  Chest Pain, Fatigue and Fatigue with Exertion   Nuclear Pre-Procedure Caffeine/Decaff Intake:  None > 12 hrs NPO After: 7:00pm   Lungs:  clear O2 Sat: 95% on room air. IV 0.9% NS with Angio Cath:  20g  IV Site: R Antecubital x 1, tolerated well IV Started by:  Irean Hong, RN  Chest Size (in):  42 Cup Size: n/a  Height: 5\' 9"  (1.753 m)  Weight:  171 lb (77.565 kg)  BMI:  Body mass index is 25.25 kg/(m^2). Tech Comments:  Took Norvasc, and Prinzide this am. FBS was 110 at 6:15 am per patient, held diabetic medications this  am.    Nuclear Med Study 1 or 2 day study: 1 day  Stress Test Type:  Treadmill/Lexiscan  Reading MD: Charlton Haws, MD  Order Authorizing Provider:  Eleonore Chiquito, MD  Resting Radionuclide: Technetium 76m Sestamibi  Resting Radionuclide Dose: 10.2 mCi   Stress Radionuclide:  Technetium 28m Sestamibi  Stress Radionuclide Dose: 32.3 mCi           Stress Protocol Rest HR: 70 Stress HR: 92  Rest BP: 137/60 Stress BP: 127/55  Exercise Time (min): n/a METS: n/a   Predicted Max HR: 153 bpm % Max HR: 60.13 bpm Rate Pressure Product: 62952   Dose of Adenosine (mg):  n/a Dose of Lexiscan: 0.4 mg  Dose of Atropine (mg): n/a Dose of Dobutamine: n/a mcg/kg/min (at max HR)  Stress Test Technologist: Milana Na, EMT-P  Nuclear Technologist:   Domenic Polite, CNMT     Rest Procedure:  Myocardial perfusion imaging was performed at rest 45 minutes following the intravenous administration of Technetium 63m Sestamibi. Rest ECG: NSR - Normal EKG  Stress Procedure:  The patient received IV Lexiscan 0.4 mg over 15-seconds with concurrent low level exercise and then Technetium 21m Sestamibi was injected at 30-seconds while the patient continued walking one more minute. There were no significant changes, lt. Headed, and warm with Lexiscan. Quantitative spect images were obtained after a 45-minute delay. Stress ECG: No significant change from baseline ECG  QPS Raw Data Images:  Normal; no motion artifact; normal heart/lung ratio. Stress Images:  There is mild apical thinning with normal uptake in other regions. Rest Images:  There is mild apical thinning with normal uptake in other regions. Subtraction (SDS):  No evidence of ischemia. Transient Ischemic Dilatation (Normal <1.22):  1.03 Lung/Heart Ratio (Normal <0.45):  0.30  Quantitative Gated Spect Images QGS EDV:  91 ml QGS ESV:  37 ml  Impression Exercise Capacity:  Lexiscan with no exercise. BP Response:  Normal blood pressure response. Clinical Symptoms:  No significant symptoms noted. ECG Impression:  No significant ST segment change suggestive of ischemia. Comparison with Prior Nuclear Study: No images to compare  Overall Impression:  Normal stress nuclear study.  No evidence of ischemia.    LV Ejection Fraction:  59%.  LV Wall Motion:  NL LV Function; NL Wall Motion.    Vesta Mixer, Montez Hageman., MD, [facility] 09/26/2012, 9:48 AM Office - [phone] Pager [phone]    .

## 2013-01-16 ENCOUNTER — Ambulatory Visit: Payer: 59 | Admitting: Internal Medicine

## 2013-01-16 ENCOUNTER — Ambulatory Visit (INDEPENDENT_AMBULATORY_CARE_PROVIDER_SITE_OTHER): Payer: 59 | Admitting: Internal Medicine

## 2013-01-16 ENCOUNTER — Encounter: Payer: Self-pay | Admitting: Internal Medicine

## 2013-01-16 VITALS — BP 138/74 | HR 72 | Temp 98.0°F | Resp 18 | Wt 178.0 lb

## 2013-01-16 DIAGNOSIS — I10 Essential (primary) hypertension: Secondary | ICD-10-CM

## 2013-01-16 DIAGNOSIS — E119 Type 2 diabetes mellitus without complications: Secondary | ICD-10-CM

## 2013-01-16 DIAGNOSIS — I2510 Atherosclerotic heart disease of native coronary artery without angina pectoris: Secondary | ICD-10-CM

## 2013-01-16 DIAGNOSIS — E538 Deficiency of other specified B group vitamins: Secondary | ICD-10-CM

## 2013-01-16 MED ORDER — METFORMIN HCL 1000 MG PO TABS: 1000.0000 mg | ORAL_TABLET | Freq: Two times a day (BID) | ORAL | Status: DC

## 2013-01-16 MED ORDER — AMLODIPINE BESYLATE 5 MG PO TABS: 5.0000 mg | ORAL_TABLET | Freq: Every day | ORAL | Status: DC

## 2013-01-16 MED ORDER — LISINOPRIL-HYDROCHLOROTHIAZIDE 20-12.5 MG PO TABS: 1.0000 | ORAL_TABLET | Freq: Every day | ORAL | Status: DC

## 2013-01-16 MED ORDER — ATORVASTATIN CALCIUM 40 MG PO TABS: 40.0000 mg | ORAL_TABLET | Freq: Every day | ORAL | Status: DC

## 2013-01-16 MED ORDER — GLIMEPIRIDE 4 MG PO TABS: 4.0000 mg | ORAL_TABLET | Freq: Every day | ORAL | Status: DC

## 2013-01-16 MED ORDER — CYANOCOBALAMIN 1000 MCG/ML IJ SOLN: 1000.0000 ug | INTRAMUSCULAR | Status: DC

## 2013-01-16 MED ORDER — CYANOCOBALAMIN 1000 MCG/ML IJ SOLN
1000.0000 ug | Freq: Once | INTRAMUSCULAR | Status: AC
  Administered 2013-01-16: 1000 ug via INTRAMUSCULAR

## 2013-01-16 MED ORDER — OMEPRAZOLE 40 MG PO CPDR: 40.0000 mg | DELAYED_RELEASE_CAPSULE | Freq: Every day | ORAL | Status: AC

## 2013-01-16 MED ORDER — "SYRINGE/NEEDLE (DISP) 25G X 1"" 3 ML MISC": 1.0000 | Status: DC

## 2013-01-16 NOTE — Patient Instructions (Signed)
Limit your sodium (Salt) intake    It is important that you exercise regularly, at least 20 minutes 3 to 4 times per week.  If you develop chest pain or shortness of breath seek  medical attention.   Please check your hemoglobin A1c every 3 months   

## 2013-01-16 NOTE — Progress Notes (Signed)
Subjective:    Patient ID: Walter [name], male    DOB: 05/20/1946, 67 y.o.   MRN: [id number]  HPI  67 year old patient who has diabetes hypertension and CAD. He is doing quite well. He is diagnosed with B12 deficiency last year and has been on monthly B12 injections. He is doing quite well. Denies any cardiopulmonary complaints.  Past Medical History  Diagnosis Date  . GERD (gastroesophageal reflux disease)   . Hypertension   . Diabetes mellitus   . Peripheral vascular disease   . Coronary artery disease   . Diverticulosis   . Esophageal stricture     History   Social History  . Marital Status: Married    Spouse Name: N/A    Number of Children: N/A  . Years of Education: N/A   Occupational History  . Not on file.   Social History Main Topics  . Smoking status: Former Games developer  . Smokeless tobacco: Never Used  . Alcohol Use: No  . Drug Use: No  . Sexually Active: Not on file   Other Topics Concern  . Not on file   Social History Narrative   Daily caffeine     Past Surgical History  Procedure Laterality Date  . Coronary angioplasty with stent placement    . Shoulder surgery  2006  . Esophagogastroduodenoscopy  06/07/2012    Procedure: ESOPHAGOGASTRODUODENOSCOPY (EGD);  Surgeon: Hart Carwin, MD;  Location: [facility];  Service: Endoscopy;  Laterality: N/A;    Family History  Problem Relation Age of Onset  . Breast cancer Mother   . Colon cancer Neg Hx     No Known Allergies  Current Outpatient Prescriptions on File Prior to Visit  Medication Sig Dispense Refill  . amLODipine (NORVASC) 5 MG tablet Take 1 tablet (5 mg total) by mouth daily.  90 tablet  6  . aspirin 81 MG tablet Take 81 mg by mouth daily.        Marland Kitchen atorvastatin (LIPITOR) 40 MG tablet Take 1 tablet (40 mg total) by mouth daily.  90 tablet  3  . cyanocobalamin (,VITAMIN B-12,) 1000 MCG/ML injection Inject 1 mL (1,000 mcg total) into the muscle every 30 (thirty) days.  3 mL  0  .  Dihydroxyaluminum Sod Carb (ROLAIDS PO) Take by mouth as needed.      Marland Kitchen glimepiride (AMARYL) 4 MG tablet Take 1 tablet (4 mg total) by mouth daily before breakfast.  90 tablet  6  . lisinopril-hydrochlorothiazide (PRINZIDE,ZESTORETIC) 20-12.5 MG per tablet Take 1 tablet by mouth daily.  90 tablet  6  . metFORMIN (GLUCOPHAGE) 1000 MG tablet Take 1 tablet (1,000 mg total) by mouth 2 (two) times daily with a meal.  180 tablet  6  . omeprazole (PRILOSEC) 40 MG capsule Take 1 capsule (40 mg total) by mouth daily.  30 capsule  3   No current facility-administered medications on file prior to visit.    BP 138/74  Pulse 72  Temp(Src) 98 F (36.7 C) (Oral)  Resp 18  Wt 178 lb (80.74 kg)  BMI 26.27 kg/m2  SpO2 97%       Review of Systems  Constitutional: Negative for fever, chills, appetite change and fatigue.  HENT: Negative for hearing loss, ear pain, congestion, sore throat, trouble swallowing, neck stiffness, dental problem, voice change and tinnitus.   Eyes: Negative for pain, discharge and visual disturbance.  Respiratory: Negative for cough, chest tightness, wheezing and stridor.   Cardiovascular: Negative for chest pain, palpitations  and leg swelling.  Gastrointestinal: Negative for nausea, vomiting, abdominal pain, diarrhea, constipation, blood in stool and abdominal distention.  Genitourinary: Negative for urgency, hematuria, flank pain, discharge, difficulty urinating and genital sores.  Musculoskeletal: Negative for myalgias, back pain, joint swelling, arthralgias and gait problem.  Skin: Negative for rash.  Neurological: Negative for dizziness, syncope, speech difficulty, weakness, numbness and headaches.  Hematological: Negative for adenopathy. Does not bruise/bleed easily.  Psychiatric/Behavioral: Negative for behavioral problems and dysphoric mood. The patient is not nervous/anxious.        Objective:   Physical Exam  Constitutional: He is oriented to person, place, and  time. He appears well-developed.  Repeat blood pressure 120/66 Fasting blood sugar today 128  HENT:  Head: Normocephalic.  Right Ear: External ear normal.  Left Ear: External ear normal.  Eyes: Conjunctivae and EOM are normal.  Neck: Normal range of motion.  Cardiovascular: Normal rate and normal heart sounds.   Pulmonary/Chest: Breath sounds normal.  O2 saturation 97%  Abdominal: Bowel sounds are normal.  Musculoskeletal: Normal range of motion. He exhibits no edema and no tenderness.  Neurological: He is alert and oriented to person, place, and time.  Psychiatric: He has a normal mood and affect. His behavior is normal.          Assessment & Plan:   Diabetes mellitus. Hemoglobin A 1C is have been stable we'll recheck today. Continue present regimen Hypertension well controlled Coronary artery disease asymptomatic B12 deficiency. We'll continue monthly B12 injections

## 2013-02-13 ENCOUNTER — Telehealth: Payer: Self-pay | Admitting: Internal Medicine

## 2013-02-13 NOTE — Telephone Encounter (Signed)
Okay to take a vitamin B12 tablets 1 daily;  have patient check with the drugstore on a weekly basis to attempt to obtain injectable B12;  Can  the pharmacist not order this medication for the patient??

## 2013-02-13 NOTE — Telephone Encounter (Signed)
Patient Information:  Caller Name: Darry  Phone: [phone]  Patient: Walter [name], Walter [name]  Gender: Male  DOB: February 22, 1946  Age: 67 Years  PCP: Eleonore Chiquito [facility])  Office Follow Up:  Does the office need to follow up with this patient?: Yes  Instructions For The Office: Please advise patient if and how often he should take oral B-12 tablets.  He states he has tried at multiple pharmacies to get the injection and none has it available as supplier is out.   Symptoms  Reason For Call & Symptoms: Patient calling; asks since B-12 Injections are not available, can he take tablets instead?  He does not know how long it will take to obtain the Injections.  Has bottle of B-12 tablets 5,000 mg each; took two just before this call; and states plan to take oral medication daily.  Reviewed Health History In EMR: Yes  Reviewed Medications In EMR: Yes  Reviewed Allergies In EMR: Yes  Reviewed Surgeries / Procedures: Yes  Date of Onset of Symptoms: Unknown  Guideline(s) Used:  No Protocol Available - Information Only  Disposition Per Guideline:   Discuss with PCP and Callback by Nurse Today  Reason For Disposition Reached:   Nursing judgment  Advice Given:  Call Back If:  New symptoms develop  You become worse.  Patient Will Follow Care Advice:  YES

## 2013-02-14 NOTE — Telephone Encounter (Signed)
Received message on my voicemail from pt's wife that he received his B12 Injection the pharmacy found it for him. Called pt back and told him got message that he got his injection good. Dr. Kirtland Bouchard made aware.

## 2013-04-18 ENCOUNTER — Encounter: Payer: Self-pay | Admitting: Internal Medicine

## 2013-04-18 ENCOUNTER — Ambulatory Visit (INDEPENDENT_AMBULATORY_CARE_PROVIDER_SITE_OTHER): Payer: 59 | Admitting: Internal Medicine

## 2013-04-18 VITALS — BP 130/70 | HR 82 | Temp 98.1°F | Resp 20 | Wt 176.0 lb

## 2013-04-18 DIAGNOSIS — E119 Type 2 diabetes mellitus without complications: Secondary | ICD-10-CM

## 2013-04-18 DIAGNOSIS — I10 Essential (primary) hypertension: Secondary | ICD-10-CM

## 2013-04-18 NOTE — Progress Notes (Signed)
Subjective:    Patient ID: Walter [name], male    DOB: 03/18/1946, 67 y.o.   MRN: [id number]  HPI  67 year old patient who is seen today for followup of diabetes and hypertension. He is doing quite well. Last hemoglobin A1c 6.5. No concerns or complaints. No history of hypoglycemia. He has a history of coronary artery disease and peripheral vascular disease. Denies any exertional chest pain. In general doing quite well.  Past Medical History  Diagnosis Date  . GERD (gastroesophageal reflux disease)   . Hypertension   . Diabetes mellitus   . Peripheral vascular disease   . Coronary artery disease   . Diverticulosis   . Esophageal stricture     History   Social History  . Marital Status: Married    Spouse Name: N/A    Number of Children: N/A  . Years of Education: N/A   Occupational History  . Not on file.   Social History Main Topics  . Smoking status: Former Games developer  . Smokeless tobacco: Never Used  . Alcohol Use: No  . Drug Use: No  . Sexually Active: Not on file   Other Topics Concern  . Not on file   Social History Narrative   Daily caffeine     Past Surgical History  Procedure Laterality Date  . Coronary angioplasty with stent placement    . Shoulder surgery  2006  . Esophagogastroduodenoscopy  06/07/2012    Procedure: ESOPHAGOGASTRODUODENOSCOPY (EGD);  Surgeon: Hart Carwin, MD;  Location: [facility];  Service: Endoscopy;  Laterality: N/A;    Family History  Problem Relation Age of Onset  . Breast cancer Mother   . Colon cancer Neg Hx     No Known Allergies  Current Outpatient Prescriptions on File Prior to Visit  Medication Sig Dispense Refill  . amLODipine (NORVASC) 5 MG tablet Take 1 tablet (5 mg total) by mouth daily.  90 tablet  4  . aspirin 81 MG tablet Take 81 mg by mouth daily.        Marland Kitchen atorvastatin (LIPITOR) 40 MG tablet Take 1 tablet (40 mg total) by mouth daily.  90 tablet  4  . cyanocobalamin (,VITAMIN B-12,) 1000 MCG/ML injection  Inject 1 mL (1,000 mcg total) into the muscle every 30 (thirty) days.  1 mL  6  . Dihydroxyaluminum Sod Carb (ROLAIDS PO) Take by mouth as needed.      Marland Kitchen glimepiride (AMARYL) 4 MG tablet Take 1 tablet (4 mg total) by mouth daily before breakfast.  90 tablet  4  . lisinopril-hydrochlorothiazide (PRINZIDE,ZESTORETIC) 20-12.5 MG per tablet Take 1 tablet by mouth daily.  90 tablet  6  . metFORMIN (GLUCOPHAGE) 1000 MG tablet Take 1 tablet (1,000 mg total) by mouth 2 (two) times daily with a meal.  180 tablet  4  . omeprazole (PRILOSEC) 40 MG capsule Take 1 capsule (40 mg total) by mouth daily.  90 capsule  4  . SYRINGE-NEEDLE, DISP, 3 ML (BD ECLIPSE SYRINGE) 25G X 1" 3 ML MISC 1 each by Does not apply route every 30 (thirty) days.  6 each  1   No current facility-administered medications on file prior to visit.    BP 130/70  Pulse 82  Temp(Src) 98.1 F (36.7 C) (Oral)  Resp 20  Wt 176 lb (79.833 kg)  BMI 25.98 kg/m2  SpO2 97%       Review of Systems  Constitutional: Negative for fever, chills, appetite change and fatigue.  HENT: Negative for  hearing loss, ear pain, congestion, sore throat, trouble swallowing, neck stiffness, dental problem, voice change and tinnitus.   Eyes: Negative for pain, discharge and visual disturbance.  Respiratory: Negative for cough, chest tightness, wheezing and stridor.   Cardiovascular: Negative for chest pain, palpitations and leg swelling.  Gastrointestinal: Negative for nausea, vomiting, abdominal pain, diarrhea, constipation, blood in stool and abdominal distention.  Genitourinary: Negative for urgency, hematuria, flank pain, discharge, difficulty urinating and genital sores.  Musculoskeletal: Negative for myalgias, back pain, joint swelling, arthralgias and gait problem.  Skin: Negative for rash.  Neurological: Negative for dizziness, syncope, speech difficulty, weakness, numbness and headaches.  Hematological: Negative for adenopathy. Does not  bruise/bleed easily.  Psychiatric/Behavioral: Negative for behavioral problems and dysphoric mood. The patient is not nervous/anxious.        Objective:   Physical Exam  Constitutional: He is oriented to person, place, and time. He appears well-developed.  HENT:  Head: Normocephalic.  Right Ear: External ear normal.  Left Ear: External ear normal.  Eyes: Conjunctivae and EOM are normal.  Neck: Normal range of motion.  Cardiovascular: Normal rate and normal heart sounds.   Pulmonary/Chest: Breath sounds normal.  Abdominal: Bowel sounds are normal.  Musculoskeletal: Normal range of motion. He exhibits no edema and no tenderness.  Neurological: He is alert and oriented to person, place, and time.  Psychiatric: He has a normal mood and affect. His behavior is normal.          Assessment & Plan:   Diabetes mellitus. Well controlled. We'll defer a hemoglobin A1c testing for 3 months hypertension well controlled coronary artery disease stable CAD stable Recheck 3 months

## 2013-04-18 NOTE — Patient Instructions (Signed)
Limit your sodium (Salt) intake   Please check your hemoglobin A1c every 3 months    It is important that you exercise regularly, at least 20 minutes 3 to 4 times per week.  If you develop chest pain or shortness of breath seek  medical attention.   

## 2013-07-06 ENCOUNTER — Ambulatory Visit (INDEPENDENT_AMBULATORY_CARE_PROVIDER_SITE_OTHER): Payer: 59 | Admitting: Internal Medicine

## 2013-07-06 ENCOUNTER — Telehealth: Payer: Self-pay | Admitting: Internal Medicine

## 2013-07-06 ENCOUNTER — Encounter: Payer: Self-pay | Admitting: Internal Medicine

## 2013-07-06 VITALS — BP 110/70 | HR 81 | Temp 98.2°F | Resp 20 | Wt 177.0 lb

## 2013-07-06 DIAGNOSIS — I10 Essential (primary) hypertension: Secondary | ICD-10-CM

## 2013-07-06 DIAGNOSIS — J069 Acute upper respiratory infection, unspecified: Secondary | ICD-10-CM

## 2013-07-06 DIAGNOSIS — E119 Type 2 diabetes mellitus without complications: Secondary | ICD-10-CM

## 2013-07-06 MED ORDER — HYDROCODONE-HOMATROPINE 5-1.5 MG/5ML PO SYRP: 5.0000 mL | ORAL_SOLUTION | Freq: Four times a day (QID) | ORAL | Status: AC | PRN

## 2013-07-06 NOTE — Patient Instructions (Addendum)
Acute bronchitis symptoms are generally not helped by antibiotics.  Take over-the-counter expectorants and cough medications such as  Mucinex DM.  Call if there is no improvement in 5 to 7 days or if he developed worsening cough, fever, or new symptoms, such as shortness of breath or chest pain. 

## 2013-07-06 NOTE — Telephone Encounter (Signed)
Please advise when pt should return for regular follow up?

## 2013-07-06 NOTE — Telephone Encounter (Signed)
OK  In 3 months

## 2013-07-06 NOTE — Progress Notes (Signed)
Subjective:    Patient ID: Walter [name], male    DOB: September 04, 1946, 67 y.o.   MRN: [id number]  HPI   67 year old patient who has coronary artery disease diabetes and hypertension. He presents with a two-week history of refractory cough. Symptoms are most bothersome at night which interferes with sleep. Cough is largely nonproductive. No fever wheezing or shortness of breath. No hemoptysis.  Past Medical History  Diagnosis Date  . GERD (gastroesophageal reflux disease)   . Hypertension   . Diabetes mellitus   . Peripheral vascular disease   . Coronary artery disease   . Diverticulosis   . Esophageal stricture     History   Social History  . Marital Status: Married    Spouse Name: N/A    Number of Children: N/A  . Years of Education: N/A   Occupational History  . Not on file.   Social History Main Topics  . Smoking status: Former Games developer  . Smokeless tobacco: Never Used  . Alcohol Use: No  . Drug Use: No  . Sexual Activity: Not on file   Other Topics Concern  . Not on file   Social History Narrative   Daily caffeine     Past Surgical History  Procedure Laterality Date  . Coronary angioplasty with stent placement    . Shoulder surgery  2006  . Esophagogastroduodenoscopy  06/07/2012    Procedure: ESOPHAGOGASTRODUODENOSCOPY (EGD);  Surgeon: Hart Carwin, MD;  Location: [facility];  Service: Endoscopy;  Laterality: N/A;    Family History  Problem Relation Age of Onset  . Breast cancer Mother   . Colon cancer Neg Hx     No Known Allergies  Current Outpatient Prescriptions on File Prior to Visit  Medication Sig Dispense Refill  . amLODipine (NORVASC) 5 MG tablet Take 1 tablet (5 mg total) by mouth daily.  90 tablet  4  . aspirin 81 MG tablet Take 81 mg by mouth daily.        Marland Kitchen atorvastatin (LIPITOR) 40 MG tablet Take 1 tablet (40 mg total) by mouth daily.  90 tablet  4  . cyanocobalamin (,VITAMIN B-12,) 1000 MCG/ML injection Inject 1 mL (1,000 mcg total) into  the muscle every 30 (thirty) days.  1 mL  6  . Dihydroxyaluminum Sod Carb (ROLAIDS PO) Take by mouth as needed.      Marland Kitchen glimepiride (AMARYL) 4 MG tablet Take 1 tablet (4 mg total) by mouth daily before breakfast.  90 tablet  4  . lisinopril-hydrochlorothiazide (PRINZIDE,ZESTORETIC) 20-12.5 MG per tablet Take 1 tablet by mouth daily.  90 tablet  6  . metFORMIN (GLUCOPHAGE) 1000 MG tablet Take 1 tablet (1,000 mg total) by mouth 2 (two) times daily with a meal.  180 tablet  4  . omeprazole (PRILOSEC) 40 MG capsule Take 1 capsule (40 mg total) by mouth daily.  90 capsule  4  . SYRINGE-NEEDLE, DISP, 3 ML (BD ECLIPSE SYRINGE) 25G X 1" 3 ML MISC 1 each by Does not apply route every 30 (thirty) days.  6 each  1   No current facility-administered medications on file prior to visit.    BP 110/70  Pulse 81  Temp(Src) 98.2 F (36.8 C) (Oral)  Resp 20  Wt 177 lb (80.287 kg)  BMI 26.13 kg/m2  SpO2 97%        Review of Systems  Constitutional: Positive for fatigue. Negative for fever, chills and appetite change.  HENT: Negative for hearing loss, ear pain, congestion,  sore throat, trouble swallowing, neck stiffness, dental problem, voice change and tinnitus.   Eyes: Negative for pain, discharge and visual disturbance.  Respiratory: Positive for cough. Negative for chest tightness, wheezing and stridor.   Cardiovascular: Negative for chest pain, palpitations and leg swelling.  Gastrointestinal: Negative for nausea, vomiting, abdominal pain, diarrhea, constipation, blood in stool and abdominal distention.  Genitourinary: Negative for urgency, hematuria, flank pain, discharge, difficulty urinating and genital sores.  Musculoskeletal: Negative for myalgias, back pain, joint swelling, arthralgias and gait problem.  Skin: Negative for rash.  Neurological: Negative for dizziness, syncope, speech difficulty, weakness, numbness and headaches.  Hematological: Negative for adenopathy. Does not bruise/bleed  easily.  Psychiatric/Behavioral: Negative for behavioral problems and dysphoric mood. The patient is not nervous/anxious.        Objective:   Physical Exam  Constitutional: He is oriented to person, place, and time. He appears well-developed.  HENT:  Head: Normocephalic.  Right Ear: External ear normal.  Left Ear: External ear normal.  Eyes: Conjunctivae and EOM are normal.  Neck: Normal range of motion.  Cardiovascular: Normal rate and normal heart sounds.   Pulmonary/Chest: Breath sounds normal.  Abdominal: Bowel sounds are normal.  Musculoskeletal: Normal range of motion. He exhibits no edema and no tenderness.  Neurological: He is alert and oriented to person, place, and time.  Psychiatric: He has a normal mood and affect. His behavior is normal.          Assessment & Plan:   Viral URI with cough. We'll treat symptomatically Hypertension stable

## 2013-07-06 NOTE — Telephone Encounter (Signed)
Pt states he was only seen for an acute issue (cough and congestion)today and was never seen for for his 3 month follow-up,  Pt needs to know when he is due to return for a follow up appt.

## 2013-07-09 NOTE — Telephone Encounter (Signed)
Spoke to pt told him to follow up office visit in 3 months per Dr. Amador Cunas. Pt verbalized understanding.

## 2013-07-19 ENCOUNTER — Ambulatory Visit: Payer: 59 | Admitting: Internal Medicine

## 2013-08-14 ENCOUNTER — Other Ambulatory Visit: Payer: Self-pay | Admitting: Internal Medicine

## 2013-08-14 ENCOUNTER — Telehealth: Payer: Self-pay | Admitting: Internal Medicine

## 2013-08-14 NOTE — Telephone Encounter (Signed)
Opened in error

## 2013-09-11 ENCOUNTER — Telehealth: Payer: Self-pay | Admitting: Internal Medicine

## 2013-09-11 MED ORDER — "SYRINGE/NEEDLE (DISP) 25G X 1"" 3 ML MISC": 1.0000 | Status: DC

## 2013-09-11 NOTE — Telephone Encounter (Signed)
Pt needs 1 ml needles for b12 injection. [facility] [place]

## 2013-09-12 NOTE — Telephone Encounter (Signed)
Left detailed message Rx sent to pharmacy as requested. 

## 2013-09-17 ENCOUNTER — Other Ambulatory Visit: Payer: Self-pay | Admitting: *Deleted

## 2013-09-17 MED ORDER — "INSULIN SYRINGE-NEEDLE U-100 25G X 1"" 1 ML MISC": 1.0000 | Status: DC

## 2013-10-02 ENCOUNTER — Ambulatory Visit (INDEPENDENT_AMBULATORY_CARE_PROVIDER_SITE_OTHER): Payer: 59 | Admitting: Internal Medicine

## 2013-10-02 ENCOUNTER — Encounter: Payer: Self-pay | Admitting: Internal Medicine

## 2013-10-02 VITALS — BP 140/80 | HR 72 | Temp 97.7°F | Resp 20 | Wt 178.0 lb

## 2013-10-02 DIAGNOSIS — I2510 Atherosclerotic heart disease of native coronary artery without angina pectoris: Secondary | ICD-10-CM

## 2013-10-02 DIAGNOSIS — I10 Essential (primary) hypertension: Secondary | ICD-10-CM

## 2013-10-02 DIAGNOSIS — E119 Type 2 diabetes mellitus without complications: Secondary | ICD-10-CM

## 2013-10-02 DIAGNOSIS — Z23 Encounter for immunization: Secondary | ICD-10-CM

## 2013-10-02 DIAGNOSIS — I739 Peripheral vascular disease, unspecified: Secondary | ICD-10-CM

## 2013-10-02 LAB — CBC WITH DIFFERENTIAL/PLATELET
Basophils Relative: 0.6 % (ref 0.0–3.0)
Eosinophils Absolute: 0.3 10*3/uL (ref 0.0–0.7)
Eosinophils Relative: 4.9 % (ref 0.0–5.0)
HCT: 37.5 % — ABNORMAL LOW (ref 39.0–52.0)
Hemoglobin: 12.5 g/dL — ABNORMAL LOW (ref 13.0–17.0)
Lymphs Abs: 1.4 10*3/uL (ref 0.7–4.0)
MCHC: 33.2 g/dL (ref 30.0–36.0)
MCV: 84.7 fl (ref 78.0–100.0)
Monocytes Absolute: 0.5 10*3/uL (ref 0.1–1.0)
Neutro Abs: 4.1 10*3/uL (ref 1.4–7.7)
Neutrophils Relative %: 64.8 % (ref 43.0–77.0)
RBC: 4.44 Mil/uL (ref 4.22–5.81)
RDW: 13.8 % (ref 11.5–14.6)
WBC: 6.3 10*3/uL (ref 4.5–10.5)

## 2013-10-02 LAB — COMPREHENSIVE METABOLIC PANEL
ALT: 52 U/L (ref 0–53)
AST: 35 U/L (ref 0–37)
Alkaline Phosphatase: 93 U/L (ref 39–117)
Calcium: 9.9 mg/dL (ref 8.4–10.5)
Chloride: 105 mEq/L (ref 96–112)
Creatinine, Ser: 1 mg/dL (ref 0.4–1.5)
Total Protein: 7.5 g/dL (ref 6.0–8.3)

## 2013-10-02 LAB — MICROALBUMIN / CREATININE URINE RATIO
Creatinine,U: 30.6 mg/dL
Microalb Creat Ratio: 2.3 mg/g (ref 0.0–30.0)

## 2013-10-02 LAB — TSH: TSH: 0.73 u[IU]/mL (ref 0.35–5.50)

## 2013-10-02 NOTE — Patient Instructions (Signed)
Limit your sodium (Salt) intake    It is important that you exercise regularly, at least 20 minutes 3 to 4 times per week.  If you develop chest pain or shortness of breath seek  medical attention. 

## 2013-10-02 NOTE — Progress Notes (Signed)
Pre-visit discussion using our clinic review tool. No additional management support is needed unless otherwise documented below in the visit note.  

## 2013-10-02 NOTE — Progress Notes (Signed)
Subjective:    Patient ID: Walter [name], male    DOB: October 09, 1946, 67 y.o.   MRN: [id number]  HPI Pre-visit discussion using our clinic review tool. No additional management support is needed unless otherwise documented below in the visit note.  67 year old patient who is seen today for his correlate followup. He has a history of type 2 diabetes which has not well controlled. He is on oral medications.  He has coronary artery disease and remains asymptomatic. No exertional chest pain. History of hypertension was also has been well-controlled. He does monitor home blood pressure readings. Other stable medical problems include PAD and gastroesophageal reflux disease. Remains on chronic PPI therapy  Past Medical History  Diagnosis Date  . GERD (gastroesophageal reflux disease)   . Hypertension   . Diabetes mellitus   . Peripheral vascular disease   . Coronary artery disease   . Diverticulosis   . Esophageal stricture     History   Social History  . Marital Status: Married    Spouse Name: N/A    Number of Children: N/A  . Years of Education: N/A   Occupational History  . Not on file.   Social History Main Topics  . Smoking status: Former Games developer  . Smokeless tobacco: Never Used  . Alcohol Use: No  . Drug Use: No  . Sexual Activity: Not on file   Other Topics Concern  . Not on file   Social History Narrative   Daily caffeine     Past Surgical History  Procedure Laterality Date  . Coronary angioplasty with stent placement    . Shoulder surgery  2006  . Esophagogastroduodenoscopy  06/07/2012    Procedure: ESOPHAGOGASTRODUODENOSCOPY (EGD);  Surgeon: Hart Carwin, MD;  Location: [facility];  Service: Endoscopy;  Laterality: N/A;    Family History  Problem Relation Age of Onset  . Breast cancer Mother   . Colon cancer Neg Hx     No Known Allergies  Current Outpatient Prescriptions on File Prior to Visit  Medication Sig Dispense Refill  . amLODipine  (NORVASC) 5 MG tablet Take 1 tablet (5 mg total) by mouth daily.  90 tablet  4  . aspirin 81 MG tablet Take 81 mg by mouth daily.        Marland Kitchen atorvastatin (LIPITOR) 40 MG tablet Take 1 tablet (40 mg total) by mouth daily.  90 tablet  4  . cyanocobalamin (,VITAMIN B-12,) 1000 MCG/ML injection INJECT INTRAMUSCULARLY EVERY 30 DAYS  1 mL  5  . Dihydroxyaluminum Sod Carb (ROLAIDS PO) Take by mouth as needed.      Marland Kitchen glimepiride (AMARYL) 4 MG tablet Take 1 tablet (4 mg total) by mouth daily before breakfast.  90 tablet  4  . Insulin Syringe-Needle U-100 (B-D INSULIN SYRINGE 1CC/25GX1") 25G X 1" 1 ML MISC 1 each by Does not apply route every 30 (thirty) days.  6 each  1  . lisinopril-hydrochlorothiazide (PRINZIDE,ZESTORETIC) 20-12.5 MG per tablet Take 1 tablet by mouth daily.  90 tablet  6  . metFORMIN (GLUCOPHAGE) 1000 MG tablet Take 1 tablet (1,000 mg total) by mouth 2 (two) times daily with a meal.  180 tablet  4  . omeprazole (PRILOSEC) 40 MG capsule Take 1 capsule (40 mg total) by mouth daily.  90 capsule  4  . SYRINGE-NEEDLE, DISP, 3 ML (BD ECLIPSE SYRINGE) 25G X 1" 3 ML MISC 1 each by Does not apply route every 30 (thirty) days.  6 each  1   No current facility-administered medications on file prior to visit.    BP 140/80  Pulse 72  Temp(Src) 97.7 F (36.5 C) (Oral)  Resp 20  Wt 178 lb (80.74 kg)  SpO2 98%     Review of Systems  Constitutional: Negative for fever, chills, appetite change and fatigue.  HENT: Negative for congestion, dental problem, ear pain, hearing loss, sore throat, tinnitus, trouble swallowing and voice change.   Eyes: Negative for pain, discharge and visual disturbance.  Respiratory: Negative for cough, chest tightness, wheezing and stridor.   Cardiovascular: Negative for chest pain, palpitations and leg swelling.  Gastrointestinal: Negative for nausea, vomiting, abdominal pain, diarrhea, constipation, blood in stool and abdominal distention.  Genitourinary:  Negative for urgency, hematuria, flank pain, discharge, difficulty urinating and genital sores.  Musculoskeletal: Negative for arthralgias, back pain, gait problem, joint swelling, myalgias and neck stiffness.  Skin: Negative for rash.  Neurological: Negative for dizziness, syncope, speech difficulty, weakness, numbness and headaches.  Hematological: Negative for adenopathy. Does not bruise/bleed easily.  Psychiatric/Behavioral: Negative for behavioral problems and dysphoric mood. The patient is not nervous/anxious.        Objective:   Physical Exam  Constitutional: He is oriented to person, place, and time. He appears well-developed.  HENT:  Head: Normocephalic.  Right Ear: External ear normal.  Left Ear: External ear normal.  Eyes: Conjunctivae and EOM are normal.  Neck: Normal range of motion.  Cardiovascular: Normal rate and normal heart sounds.   The left posterior tibial pulse full; other peripheral pulses not easily palpable  Pulmonary/Chest: Breath sounds normal.  Abdominal: Bowel sounds are normal.  Musculoskeletal: Normal range of motion. He exhibits no edema and no tenderness.  Neurological: He is alert and oriented to person, place, and time.  Psychiatric: He has a normal mood and affect. His behavior is normal.          Assessment & Plan:   Diabetes mellitus. We'll check a hemoglobin A1c as well as a urine for microalbumin Hypertension well controlled Dyslipidemia. Continue atorvastatin therapy  Laboratory updated CPX 6 months

## 2013-10-03 ENCOUNTER — Ambulatory Visit: Payer: 59 | Admitting: Internal Medicine

## 2014-01-22 ENCOUNTER — Other Ambulatory Visit: Payer: Self-pay | Admitting: Internal Medicine

## 2014-02-11 ENCOUNTER — Other Ambulatory Visit: Payer: Self-pay | Admitting: Internal Medicine

## 2014-02-12 ENCOUNTER — Other Ambulatory Visit: Payer: Self-pay | Admitting: Internal Medicine

## 2014-02-14 ENCOUNTER — Other Ambulatory Visit: Payer: Self-pay | Admitting: Internal Medicine

## 2014-03-01 ENCOUNTER — Encounter: Payer: Self-pay | Admitting: Internal Medicine

## 2014-03-01 ENCOUNTER — Ambulatory Visit (INDEPENDENT_AMBULATORY_CARE_PROVIDER_SITE_OTHER): Payer: 59 | Admitting: Internal Medicine

## 2014-03-01 VITALS — BP 122/70 | HR 106 | Temp 98.9°F | Resp 20 | Ht 69.0 in | Wt 177.0 lb

## 2014-03-01 DIAGNOSIS — J069 Acute upper respiratory infection, unspecified: Secondary | ICD-10-CM

## 2014-03-01 DIAGNOSIS — I10 Essential (primary) hypertension: Secondary | ICD-10-CM

## 2014-03-01 DIAGNOSIS — E119 Type 2 diabetes mellitus without complications: Secondary | ICD-10-CM

## 2014-03-01 LAB — HEMOGLOBIN A1C: Hgb A1c MFr Bld: 6.5 % (ref 4.6–6.5)

## 2014-03-01 MED ORDER — HYDROCODONE-HOMATROPINE 5-1.5 MG/5ML PO SYRP: 5.0000 mL | ORAL_SOLUTION | Freq: Four times a day (QID) | ORAL | Status: DC | PRN

## 2014-03-01 NOTE — Progress Notes (Signed)
Subjective:    Patient ID: Walter [name], male    DOB: 01-31-1946, 68 y.o.   MRN: [id number]  HPI  68 year old patient who has a history of hypertension and diabetes.  For the past 3 days.  She has had some head and chest congestion and cough.  Cough is doing quite incessant and interfering with sleep.  Denies any fever, wheezing, shortness of breath, or chest pain.  Past Medical History  Diagnosis Date  . GERD (gastroesophageal reflux disease)   . Hypertension   . Diabetes mellitus   . Peripheral vascular disease   . Coronary artery disease   . Diverticulosis   . Esophageal stricture     History   Social History  . Marital Status: Married    Spouse Name: N/A    Number of Children: N/A  . Years of Education: N/A   Occupational History  . Not on file.   Social History Main Topics  . Smoking status: Former Research scientist (life sciences)  . Smokeless tobacco: Never Used  . Alcohol Use: No  . Drug Use: No  . Sexual Activity: Not on file   Other Topics Concern  . Not on file   Social History Narrative   Daily caffeine     Past Surgical History  Procedure Laterality Date  . Coronary angioplasty with stent placement    . Shoulder surgery  2006  . Esophagogastroduodenoscopy  06/07/2012    Procedure: ESOPHAGOGASTRODUODENOSCOPY (EGD);  Surgeon: Lafayette Dragon, MD;  Location: [facility];  Service: Endoscopy;  Laterality: N/A;    Family History  Problem Relation Age of Onset  . Breast cancer Mother   . Colon cancer Neg Hx     No Known Allergies  Current Outpatient Prescriptions on File Prior to Visit  Medication Sig Dispense Refill  . amLODipine (NORVASC) 5 MG tablet Take 1 tablet (5 mg total) by mouth daily.  90 tablet  4  . aspirin 81 MG tablet Take 81 mg by mouth daily.        Marland Kitchen atorvastatin (LIPITOR) 40 MG tablet TAKE ONE TABLET BY MOUTH ONCE DAILY  90 tablet  0  . cyanocobalamin (,VITAMIN B-12,) 1000 MCG/ML injection INJECT 1ML INTRAMUSCULARLY EVERY 30 DAYS  1 mL  0  .  Dihydroxyaluminum Sod Carb (ROLAIDS PO) Take by mouth as needed.      Marland Kitchen glimepiride (AMARYL) 4 MG tablet Take 1 tablet (4 mg total) by mouth daily before breakfast.  90 tablet  4  . Insulin Syringe-Needle U-100 (B-D INSULIN SYRINGE 1CC/25GX1") 25G X 1" 1 ML MISC 1 each by Does not apply route every 30 (thirty) days.  6 each  1  . lisinopril-hydrochlorothiazide (PRINZIDE,ZESTORETIC) 20-12.5 MG per tablet TAKE ONE TABLET BY MOUTH ONCE DAILY  90 tablet  0  . metFORMIN (GLUCOPHAGE) 1000 MG tablet TAKE ONE TABLET BY MOUTH TWICE DAILY WITH  A  MEAL  180 tablet  0  . SYRINGE-NEEDLE, DISP, 3 ML (BD ECLIPSE SYRINGE) 25G X 1" 3 ML MISC 1 each by Does not apply route every 30 (thirty) days.  6 each  1   No current facility-administered medications on file prior to visit.    BP 122/70  Pulse 106  Temp(Src) 98.9 F (37.2 C) (Oral)  Resp 20  Ht 5\' 9"  (1.753 m)  Wt 177 lb (80.287 kg)  BMI 26.13 kg/m2  SpO2 96%   Results for orders placed in visit on 10/02/13  MICROALBUMIN / CREATININE URINE RATIO  Result Value Ref Range   Microalb, Ur 0.7  0.0 - 1.9 mg/dL   Creatinine,U 30.6     Microalb Creat Ratio 2.3  0.0 - 30.0 mg/g  TSH      Result Value Ref Range   TSH 0.73  0.35 - 5.50 uIU/mL  COMPREHENSIVE METABOLIC PANEL      Result Value Ref Range   Sodium 140  135 - 145 mEq/L   Potassium 4.5  3.5 - 5.1 mEq/L   Chloride 105  96 - 112 mEq/L   CO2 29  19 - 32 mEq/L   Glucose, Bld 80  70 - 99 mg/dL   BUN 14  6 - 23 mg/dL   Creatinine, Ser 1.0  0.4 - 1.5 mg/dL   Total Bilirubin 0.6  0.3 - 1.2 mg/dL   Alkaline Phosphatase 93  39 - 117 U/L   AST 35  0 - 37 U/L   ALT 52  0 - 53 U/L   Total Protein 7.5  6.0 - 8.3 g/dL   Albumin 4.2  3.5 - 5.2 g/dL   Calcium 9.9  8.4 - 10.5 mg/dL   GFR 76.23  >60.00 mL/min  HEMOGLOBIN A1C      Result Value Ref Range   Hemoglobin A1C 6.7 (*) 4.6 - 6.5 %  CBC WITH DIFFERENTIAL      Result Value Ref Range   WBC 6.3  4.5 - 10.5 K/uL   RBC 4.44  4.22 - 5.81  Mil/uL   Hemoglobin 12.5 (*) 13.0 - 17.0 g/dL   HCT 37.5 (*) 39.0 - 52.0 %   MCV 84.7  78.0 - 100.0 fl   MCHC 33.2  30.0 - 36.0 g/dL   RDW 13.8  11.5 - 14.6 %   Platelets 238.0  150.0 - 400.0 K/uL   Neutrophils Relative % 64.8  43.0 - 77.0 %   Lymphocytes Relative 22.4  12.0 - 46.0 %   Monocytes Relative 7.3  3.0 - 12.0 %   Eosinophils Relative 4.9  0.0 - 5.0 %   Basophils Relative 0.6  0.0 - 3.0 %   Neutro Abs 4.1  1.4 - 7.7 K/uL   Lymphs Abs 1.4  0.7 - 4.0 K/uL   Monocytes Absolute 0.5  0.1 - 1.0 K/uL   Eosinophils Absolute 0.3  0.0 - 0.7 K/uL   Basophils Absolute 0.0  0.0 - 0.1 K/uL       Review of Systems  Constitutional: Positive for activity change, appetite change and fatigue. Negative for fever and chills.  HENT: Positive for congestion, postnasal drip, rhinorrhea and sinus pressure. Negative for dental problem, ear pain, hearing loss, sore throat, tinnitus, trouble swallowing and voice change.   Eyes: Negative for pain, discharge and visual disturbance.  Respiratory: Positive for cough. Negative for chest tightness, wheezing and stridor.   Cardiovascular: Negative for chest pain, palpitations and leg swelling.  Gastrointestinal: Negative for nausea, vomiting, abdominal pain, diarrhea, constipation, blood in stool and abdominal distention.  Genitourinary: Negative for urgency, hematuria, flank pain, discharge, difficulty urinating and genital sores.  Musculoskeletal: Negative for arthralgias, back pain, gait problem, joint swelling, myalgias and neck stiffness.  Skin: Negative for rash.  Neurological: Negative for dizziness, syncope, speech difficulty, weakness, numbness and headaches.  Hematological: Negative for adenopathy. Does not bruise/bleed easily.  Psychiatric/Behavioral: Negative for behavioral problems and dysphoric mood. The patient is not nervous/anxious.        Objective:   Physical Exam  Constitutional: He is oriented to person, place,  and time. He  appears well-developed.  HENT:  Head: Normocephalic.  Right Ear: External ear normal.  Left Ear: External ear normal.  Eyes: Conjunctivae and EOM are normal.  Neck: Normal range of motion.  Cardiovascular: Normal rate and normal heart sounds.   Pulmonary/Chest: Breath sounds normal. No respiratory distress. He has no wheezes. He has no rales.  Abdominal: Bowel sounds are normal.  Musculoskeletal: Normal range of motion. He exhibits no edema and no tenderness.  Neurological: He is alert and oriented to person, place, and time.  Psychiatric: He has a normal mood and affect. His behavior is normal.          Assessment & Plan:   Viral URI with cough.  Will treat symptomatically Hypertension stable Diabetes mellitus.  We'll check a hemoglobin A1c  Recheck 6 months or as needed

## 2014-03-01 NOTE — Progress Notes (Signed)
Pre-visit discussion using our clinic review tool. No additional management support is needed unless otherwise documented below in the visit note.  

## 2014-03-01 NOTE — Patient Instructions (Signed)
Limit your sodium (Salt) intake   Please check your hemoglobin A1c every 3 months  Acute bronchitis symptoms for less than 10 days are generally not helped by antibiotics.  Take over-the-counter expectorants and cough medications such as  Mucinex DM.  Call if there is no improvement in 5 to 7 days or if  you develop worsening cough, fever, or new symptoms, such as shortness of breath or chest pain.  Return in 6 months for follow-up

## 2014-03-04 ENCOUNTER — Telehealth: Payer: Self-pay | Admitting: Internal Medicine

## 2014-03-04 MED ORDER — CYANOCOBALAMIN 1000 MCG/ML IJ SOLN: INTRAMUSCULAR | Status: DC

## 2014-03-04 NOTE — Telephone Encounter (Signed)
Pt wants to speak to you.  He wants medication for cough and cold.

## 2014-03-04 NOTE — Telephone Encounter (Signed)
Spoke to pt told him Dr. Raliegh Ip gave him a Rx for cough syrup and was suppose to take OTC expectorants Mucinex DM. Pt said he is taking the cough syrup but was not aware he was to take Mucinex. Told pt yes you can but it OTC. Pt verbalized understanding.

## 2014-03-04 NOTE — Telephone Encounter (Signed)
He goes to [employer] in [place]

## 2014-03-04 NOTE — Telephone Encounter (Signed)
Relevant patient education mailed to patient.  

## 2014-03-26 ENCOUNTER — Other Ambulatory Visit (INDEPENDENT_AMBULATORY_CARE_PROVIDER_SITE_OTHER): Payer: 59

## 2014-03-26 DIAGNOSIS — Z0000 Encounter for general adult medical examination without abnormal findings: Secondary | ICD-10-CM

## 2014-03-26 LAB — HEPATIC FUNCTION PANEL
ALBUMIN: 3.6 g/dL (ref 3.5–5.2)
ALK PHOS: 81 U/L (ref 39–117)
ALT: 27 U/L (ref 0–53)
AST: 18 U/L (ref 0–37)
Bilirubin, Direct: 0 mg/dL (ref 0.0–0.3)
TOTAL PROTEIN: 6.6 g/dL (ref 6.0–8.3)
Total Bilirubin: 0.4 mg/dL (ref 0.2–1.2)

## 2014-03-26 LAB — CBC WITH DIFFERENTIAL/PLATELET
BASOS ABS: 0 10*3/uL (ref 0.0–0.1)
Basophils Relative: 0.4 % (ref 0.0–3.0)
EOS ABS: 0.2 10*3/uL (ref 0.0–0.7)
Eosinophils Relative: 3.2 % (ref 0.0–5.0)
HEMATOCRIT: 35 % — AB (ref 39.0–52.0)
HEMOGLOBIN: 11.5 g/dL — AB (ref 13.0–17.0)
Lymphocytes Relative: 21.5 % (ref 12.0–46.0)
Lymphs Abs: 1.1 10*3/uL (ref 0.7–4.0)
MCHC: 32.9 g/dL (ref 30.0–36.0)
MCV: 84.5 fl (ref 78.0–100.0)
Monocytes Absolute: 0.4 10*3/uL (ref 0.1–1.0)
Monocytes Relative: 7.5 % (ref 3.0–12.0)
Neutro Abs: 3.5 10*3/uL (ref 1.4–7.7)
Neutrophils Relative %: 67.4 % (ref 43.0–77.0)
Platelets: 227 10*3/uL (ref 150.0–400.0)
RBC: 4.14 Mil/uL — ABNORMAL LOW (ref 4.22–5.81)
RDW: 14.1 % (ref 11.5–15.5)
WBC: 5.2 10*3/uL (ref 4.0–10.5)

## 2014-03-26 LAB — BASIC METABOLIC PANEL
BUN: 19 mg/dL (ref 6–23)
CALCIUM: 9.3 mg/dL (ref 8.4–10.5)
CO2: 28 meq/L (ref 19–32)
Chloride: 105 mEq/L (ref 96–112)
Creatinine, Ser: 1 mg/dL (ref 0.4–1.5)
GFR: 75.28 mL/min (ref >60.00)
GLUCOSE: 93 mg/dL (ref 70–99)
POTASSIUM: 4.5 meq/L (ref 3.5–5.1)
Sodium: 140 mEq/L (ref 135–145)

## 2014-03-26 LAB — POCT URINALYSIS DIPSTICK
Bilirubin, UA: NEGATIVE
Blood, UA: NEGATIVE
Glucose, UA: NEGATIVE
KETONES UA: NEGATIVE
LEUKOCYTES UA: NEGATIVE
Nitrite, UA: NEGATIVE
Protein, UA: NEGATIVE
Spec Grav, UA: 1.02
Urobilinogen, UA: 0.2
pH, UA: 7

## 2014-03-26 LAB — MICROALBUMIN / CREATININE URINE RATIO
CREATININE, U: 144.8 mg/dL
Microalb Creat Ratio: 0.1 mg/g (ref 0.0–30.0)
Microalb, Ur: 0.2 mg/dL (ref 0.0–1.9)

## 2014-03-26 LAB — LIPID PANEL
Cholesterol: 150 mg/dL (ref 0–200)
HDL: 37.5 mg/dL — ABNORMAL LOW (ref >39.00)
LDL Cholesterol: 88 mg/dL (ref 0–99)
Total CHOL/HDL Ratio: 4
Triglycerides: 124 mg/dL (ref 0.0–149.0)
VLDL: 24.8 mg/dL (ref 0.0–40.0)

## 2014-03-26 LAB — TSH: TSH: 0.5 u[IU]/mL (ref 0.35–4.50)

## 2014-03-26 LAB — PSA: PSA: 0.89 ng/mL (ref 0.10–4.00)

## 2014-03-26 LAB — HEMOGLOBIN A1C: Hgb A1c MFr Bld: 6.8 % — ABNORMAL HIGH (ref 4.6–6.5)

## 2014-03-28 ENCOUNTER — Other Ambulatory Visit: Payer: Self-pay | Admitting: Internal Medicine

## 2014-04-02 ENCOUNTER — Encounter: Payer: Self-pay | Admitting: Internal Medicine

## 2014-04-02 ENCOUNTER — Ambulatory Visit (INDEPENDENT_AMBULATORY_CARE_PROVIDER_SITE_OTHER): Payer: 59 | Admitting: Internal Medicine

## 2014-04-02 VITALS — BP 128/64 | HR 68 | Temp 97.7°F | Resp 20 | Ht 68.5 in | Wt 176.0 lb

## 2014-04-02 DIAGNOSIS — E119 Type 2 diabetes mellitus without complications: Secondary | ICD-10-CM

## 2014-04-02 DIAGNOSIS — I10 Essential (primary) hypertension: Secondary | ICD-10-CM

## 2014-04-02 DIAGNOSIS — I2510 Atherosclerotic heart disease of native coronary artery without angina pectoris: Secondary | ICD-10-CM

## 2014-04-02 DIAGNOSIS — Z23 Encounter for immunization: Secondary | ICD-10-CM

## 2014-04-02 DIAGNOSIS — Z0000 Encounter for general adult medical examination without abnormal findings: Secondary | ICD-10-CM

## 2014-04-02 DIAGNOSIS — K222 Esophageal obstruction: Secondary | ICD-10-CM

## 2014-04-02 DIAGNOSIS — E538 Deficiency of other specified B group vitamins: Secondary | ICD-10-CM

## 2014-04-02 DIAGNOSIS — I739 Peripheral vascular disease, unspecified: Secondary | ICD-10-CM

## 2014-04-02 NOTE — Progress Notes (Signed)
Pre-visit discussion using our clinic review tool. No additional management support is needed unless otherwise documented below in the visit note.  

## 2014-04-02 NOTE — Progress Notes (Signed)
Subjective:    Patient ID: Walter [name], male    DOB: 08-Dec-1945, 68 y.o.   MRN: [id number]  HPI   68 year old patient who is seen today for a health maintenance examination.   He has coronary artery disease and peripheral vascular disease which has been stable. Medical regimen includes Prilosec 20 mg daily He has type 2 diabetes which has been quite stable hemoglobin A1c 6.5. He has treated hypertension and a history of dyslipidemia. Remains on simvastatin 40 mg daily.  Allergies:  No Known Drug Allergies   Past History:  Past Medical History:   Coronary artery disease  Diabetes mellitus, type II  Hypertension  Hypercholesterolemia  Peripheral vascular disease  GERD   Past Surgical History:   Angioplasty 2004  sigmoidoscopy 2003  colonoscopy 2010  Family History:   Family History Other cancer-Breast  father died age 22, pneumonia  mother history breast cancer  one brother one sister in good health   Social History:  Reviewed history from 03/13/2008 and no changes required.  Former Smoker  Married  exercises at [employer] 2-4 times per week  Retired  Colonoscopy December 2010   Review of Systems  Constitutional: Negative for fever, chills, activity change, appetite change and fatigue.  HENT: Negative for congestion, dental problem, ear pain, hearing loss, mouth sores, rhinorrhea, sinus pressure, sneezing, tinnitus, trouble swallowing and voice change.   Eyes: Negative for photophobia, pain, redness and visual disturbance.  Respiratory: Negative for apnea, cough, choking, chest tightness, shortness of breath and wheezing.   Cardiovascular: Negative for chest pain, palpitations and leg swelling.  Gastrointestinal: Negative for nausea, vomiting, abdominal pain, diarrhea, constipation, blood in stool, abdominal distention, anal bleeding and rectal pain.  Genitourinary: Negative for dysuria, urgency, frequency, hematuria, flank pain, decreased urine volume, discharge,  penile swelling, scrotal swelling, difficulty urinating, genital sores and testicular pain.  Musculoskeletal: Negative for arthralgias, back pain, gait problem, joint swelling, myalgias, neck pain and neck stiffness.  Skin: Negative for color change, rash and wound.  Neurological: Negative for dizziness, tremors, seizures, syncope, facial asymmetry, speech difficulty, weakness, light-headedness, numbness and headaches.  Hematological: Negative for adenopathy. Does not bruise/bleed easily.  Psychiatric/Behavioral: Negative for suicidal ideas, hallucinations, behavioral problems, confusion, sleep disturbance, self-injury, dysphoric mood, decreased concentration and agitation. The patient is not nervous/anxious.        Objective:   Physical Exam  Constitutional: He appears well-developed and well-nourished.  HENT:  Head: Normocephalic and atraumatic.  Right Ear: External ear normal.  Left Ear: External ear normal.  Nose: Nose normal.  Mouth/Throat: Oropharynx is clear and moist.  Eyes: Conjunctivae and EOM are normal. Pupils are equal, round, and reactive to light. No scleral icterus.  Neck: Normal range of motion. Neck supple. No JVD present. No thyromegaly present.  Cardiovascular: Regular rhythm and normal heart sounds.  Exam reveals no gallop and no friction rub.   No murmur heard. Pedal pulses full except for an absent right posterior tibial pulse Left femoral bruit  Pulmonary/Chest: Effort normal and breath sounds normal. He exhibits no tenderness.  Abdominal: Soft. Bowel sounds are normal. He exhibits no distension and no mass. There is no tenderness.  Genitourinary: Penis normal.  Prostate +2 enlarged and benign  Musculoskeletal: Normal range of motion. He exhibits no edema and no tenderness.  Lymphadenopathy:    He has no cervical adenopathy.  Neurological: He is alert. He has normal reflexes. No cranial nerve deficit. Coordination normal.  Skin: Skin is warm and dry. No rash  noted.  Psychiatric: He has a normal mood and affect. His behavior is normal.          Assessment & Plan:   Preventive health examination Coronary artery disease stable. We'll continue aggressive risk factor modification Hypertension well controlled Dyslipidemia controlled;  continue statin therapy  Peripheral vascular disease. Remains asymptomatic

## 2014-04-02 NOTE — Patient Instructions (Signed)
Limit your sodium (Salt) intake    It is important that you exercise regularly, at least 20 minutes 3 to 4 times per week.  If you develop chest pain or shortness of breath seek  medical attention.  Please check your blood pressure on a regular basis.  If it is consistently greater than 150/90, please make an office appointment.  Return in 6 months for follow-up   

## 2014-04-08 ENCOUNTER — Other Ambulatory Visit: Payer: Self-pay | Admitting: Internal Medicine

## 2014-04-08 ENCOUNTER — Other Ambulatory Visit: Payer: Self-pay | Admitting: *Deleted

## 2014-04-29 ENCOUNTER — Other Ambulatory Visit: Payer: Self-pay | Admitting: Internal Medicine

## 2014-05-17 ENCOUNTER — Other Ambulatory Visit: Payer: Self-pay | Admitting: Internal Medicine

## 2014-05-25 ENCOUNTER — Other Ambulatory Visit: Payer: Self-pay | Admitting: Internal Medicine

## 2014-05-30 ENCOUNTER — Other Ambulatory Visit: Payer: Self-pay | Admitting: Internal Medicine

## 2014-07-01 ENCOUNTER — Other Ambulatory Visit: Payer: Self-pay | Admitting: Internal Medicine

## 2014-07-17 ENCOUNTER — Encounter: Payer: Self-pay | Admitting: Internal Medicine

## 2014-08-23 ENCOUNTER — Telehealth: Payer: Self-pay | Admitting: Internal Medicine

## 2014-08-23 MED ORDER — CYANOCOBALAMIN 1000 MCG/ML IJ SOLN: INTRAMUSCULAR | Status: DC

## 2014-08-23 NOTE — Telephone Encounter (Signed)
Rx sent 

## 2014-08-23 NOTE — Telephone Encounter (Signed)
Pt request refill cyanocobalamin (,VITAMIN B-12,) 1000 MCG/ML injection [facility]

## 2014-09-02 ENCOUNTER — Ambulatory Visit: Payer: 59 | Admitting: Internal Medicine

## 2014-09-30 ENCOUNTER — Other Ambulatory Visit: Payer: Self-pay | Admitting: Internal Medicine

## 2014-10-01 ENCOUNTER — Telehealth: Payer: Self-pay | Admitting: Internal Medicine

## 2014-10-01 NOTE — Telephone Encounter (Signed)
Pt's wife asked if you could give her a call concerning pt and his visit tomorrow. Wife states pt will not remember to tell the dr everything and she would like to go over info w/ you prior to then. She will be at home until 3:30 today

## 2014-10-01 NOTE — Telephone Encounter (Signed)
Spoke to Walter [name], told her I can not discuss pt information with her due to not on pt's HIPPA. Told her he will need to sign a release. Walter [name] verbalized understanding.

## 2014-10-02 ENCOUNTER — Ambulatory Visit (INDEPENDENT_AMBULATORY_CARE_PROVIDER_SITE_OTHER): Payer: 59 | Admitting: Internal Medicine

## 2014-10-02 ENCOUNTER — Encounter: Payer: Self-pay | Admitting: Internal Medicine

## 2014-10-02 VITALS — BP 110/60 | HR 88 | Temp 97.9°F | Resp 20 | Ht 68.5 in | Wt 175.0 lb

## 2014-10-02 DIAGNOSIS — E1151 Type 2 diabetes mellitus with diabetic peripheral angiopathy without gangrene: Secondary | ICD-10-CM

## 2014-10-02 DIAGNOSIS — Z23 Encounter for immunization: Secondary | ICD-10-CM

## 2014-10-02 DIAGNOSIS — E538 Deficiency of other specified B group vitamins: Secondary | ICD-10-CM

## 2014-10-02 DIAGNOSIS — E1159 Type 2 diabetes mellitus with other circulatory complications: Secondary | ICD-10-CM

## 2014-10-02 DIAGNOSIS — I2510 Atherosclerotic heart disease of native coronary artery without angina pectoris: Secondary | ICD-10-CM

## 2014-10-02 DIAGNOSIS — I10 Essential (primary) hypertension: Secondary | ICD-10-CM

## 2014-10-02 LAB — HEMOGLOBIN A1C: HEMOGLOBIN A1C: 6.9 % — AB (ref 4.6–6.5)

## 2014-10-02 MED ORDER — HYDROCODONE-HOMATROPINE 5-1.5 MG/5ML PO SYRP: 5.0000 mL | ORAL_SOLUTION | Freq: Four times a day (QID) | ORAL | Status: DC | PRN

## 2014-10-02 NOTE — Progress Notes (Signed)
Subjective:    Patient ID: Walter [name], male    DOB: 06-22-1946, 68 y.o.   MRN: [id number]  HPI  68 year old patient who is seen today for his bimonthly follow-up.  He has type 2 diabetes which has been well controlled on oral medication. He has coronary artery disease and peripheral vascular disease which has been stable. He has treated hypertension and is on statin therapy, which she continues to tolerate. No claudication or cardiopulmonary complaints. Blood sugars have been well controlled  Lab Results  Component Value Date   HGBA1C 6.8* 03/26/2014     Past Medical History  Diagnosis Date  . GERD (gastroesophageal reflux disease)   . Hypertension   . Diabetes mellitus   . Peripheral vascular disease   . Coronary artery disease   . Diverticulosis   . Esophageal stricture     History   Social History  . Marital Status: Married    Spouse Name: N/A    Number of Children: N/A  . Years of Education: N/A   Occupational History  . Not on file.   Social History Main Topics  . Smoking status: Former Research scientist (life sciences)  . Smokeless tobacco: Never Used  . Alcohol Use: No  . Drug Use: No  . Sexual Activity: Not on file   Other Topics Concern  . Not on file   Social History Narrative   Daily caffeine     Past Surgical History  Procedure Laterality Date  . Coronary angioplasty with stent placement    . Shoulder surgery  2006  . Esophagogastroduodenoscopy  06/07/2012    Procedure: ESOPHAGOGASTRODUODENOSCOPY (EGD);  Surgeon: Lafayette Dragon, MD;  Location: [facility];  Service: Endoscopy;  Laterality: N/A;    Family History  Problem Relation Age of Onset  . Breast cancer Mother   . Colon cancer Neg Hx     No Known Allergies  Current Outpatient Prescriptions on File Prior to Visit  Medication Sig Dispense Refill  . amLODipine (NORVASC) 5 MG tablet TAKE ONE TABLET BY MOUTH ONCE DAILY 90 tablet 1  . aspirin 81 MG tablet Take 81 mg by mouth daily.      Marland Kitchen atorvastatin  (LIPITOR) 40 MG tablet TAKE ONE TABLET BY MOUTH ONCE DAILY 90 tablet 1  . Dihydroxyaluminum Sod Carb (ROLAIDS PO) Take by mouth as needed.    Marland Kitchen glimepiride (AMARYL) 4 MG tablet TAKE ONE TABLET BY MOUTH BEFORE BREAKFAST 90 tablet 1  . lisinopril-hydrochlorothiazide (PRINZIDE,ZESTORETIC) 20-12.5 MG per tablet TAKE ONE TABLET BY MOUTH ONCE DAILY 90 tablet 1  . metFORMIN (GLUCOPHAGE) 1000 MG tablet TAKE ONE TABLET BY MOUTH TWICE DAILY WITH A MEAL 180 tablet 1  . SYRINGE-NEEDLE, DISP, 3 ML (BD ECLIPSE SYRINGE) 25G X 1" 3 ML MISC 1 each by Does not apply route every 30 (thirty) days. (Patient not taking: Reported on 10/02/2014) 6 each 1   No current facility-administered medications on file prior to visit.    BP 110/60 mmHg  Pulse 88  Temp(Src) 97.9 F (36.6 C) (Oral)  Resp 20  Ht 5' 8.5" (1.74 m)  Wt 175 lb (79.379 kg)  BMI 26.22 kg/m2  SpO2 97%      Review of Systems  Constitutional: Negative for fever, chills, appetite change and fatigue.  HENT: Negative for congestion, dental problem, ear pain, hearing loss, sore throat, tinnitus, trouble swallowing and voice change.   Eyes: Negative for pain, discharge and visual disturbance.  Respiratory: Negative for cough, chest tightness, wheezing and stridor.  Cardiovascular: Negative for chest pain, palpitations and leg swelling.  Gastrointestinal: Negative for nausea, vomiting, abdominal pain, diarrhea, constipation, blood in stool and abdominal distention.  Genitourinary: Negative for urgency, hematuria, flank pain, discharge, difficulty urinating and genital sores.  Musculoskeletal: Negative for myalgias, back pain, joint swelling, arthralgias, gait problem and neck stiffness.  Skin: Negative for rash.  Neurological: Negative for dizziness, syncope, speech difficulty, weakness, numbness and headaches.  Hematological: Negative for adenopathy. Does not bruise/bleed easily.  Psychiatric/Behavioral: Negative for behavioral problems and  dysphoric mood. The patient is not nervous/anxious.        Objective:   Physical Exam  Constitutional: He is oriented to person, place, and time. He appears well-developed.  Blood pressure low normal  HENT:  Head: Normocephalic.  Right Ear: External ear normal.  Left Ear: External ear normal.  Eyes: Conjunctivae and EOM are normal.  Neck: Normal range of motion.  Cardiovascular: Normal rate and normal heart sounds.   Right posterior tibial pulse, decreased  Pulmonary/Chest: Breath sounds normal.  Abdominal: Bowel sounds are normal.  Musculoskeletal: Normal range of motion. He exhibits no edema or tenderness.  Neurological: He is alert and oriented to person, place, and time.  Psychiatric: He has a normal mood and affect. His behavior is normal.          Assessment & Plan:   Diabetes mellitus.  Appears to be well controlled.  Will check a hemoglobin A1c.  CPX in 6 months Hypertension, excellent control Coronary artery disease, stable PAD, stable

## 2014-10-02 NOTE — Progress Notes (Signed)
Pre visit review using our clinic review tool, if applicable. No additional management support is needed unless otherwise documented below in the visit note. 

## 2014-10-02 NOTE — Patient Instructions (Signed)
Limit your sodium (Salt) intake    It is important that you exercise regularly, at least 20 minutes 3 to 4 times per week.  If you develop chest pain or shortness of breath seek  medical attention.  Return in 6 months for follow-up  

## 2014-10-07 ENCOUNTER — Other Ambulatory Visit: Payer: Self-pay | Admitting: Internal Medicine

## 2014-10-10 ENCOUNTER — Telehealth: Payer: Self-pay | Admitting: Internal Medicine

## 2014-10-10 NOTE — Telephone Encounter (Signed)
Left message on voicemail to call office. Hemoglobin A1c is 6.9.

## 2014-10-10 NOTE — Telephone Encounter (Signed)
Pt would like a call back about his blood work .

## 2014-10-11 NOTE — Telephone Encounter (Signed)
Pt aware.

## 2014-10-27 ENCOUNTER — Other Ambulatory Visit: Payer: Self-pay | Admitting: Internal Medicine

## 2014-11-13 ENCOUNTER — Other Ambulatory Visit: Payer: Self-pay | Admitting: Internal Medicine

## 2015-01-28 LAB — HM DIABETES EYE EXAM

## 2015-02-10 ENCOUNTER — Encounter: Payer: Self-pay | Admitting: Internal Medicine

## 2015-03-24 ENCOUNTER — Other Ambulatory Visit: Payer: Self-pay | Admitting: Internal Medicine

## 2015-04-10 ENCOUNTER — Encounter: Payer: Self-pay | Admitting: Internal Medicine

## 2015-04-10 ENCOUNTER — Ambulatory Visit (INDEPENDENT_AMBULATORY_CARE_PROVIDER_SITE_OTHER): Payer: PPO | Admitting: Internal Medicine

## 2015-04-10 VITALS — BP 132/60 | HR 73 | Temp 97.6°F | Resp 20 | Ht 68.5 in | Wt 176.0 lb

## 2015-04-10 DIAGNOSIS — I10 Essential (primary) hypertension: Secondary | ICD-10-CM | POA: Diagnosis not present

## 2015-04-10 DIAGNOSIS — I2510 Atherosclerotic heart disease of native coronary artery without angina pectoris: Secondary | ICD-10-CM

## 2015-04-10 DIAGNOSIS — E1159 Type 2 diabetes mellitus with other circulatory complications: Secondary | ICD-10-CM

## 2015-04-10 DIAGNOSIS — E538 Deficiency of other specified B group vitamins: Secondary | ICD-10-CM

## 2015-04-10 DIAGNOSIS — Z0000 Encounter for general adult medical examination without abnormal findings: Secondary | ICD-10-CM

## 2015-04-10 DIAGNOSIS — E1151 Type 2 diabetes mellitus with diabetic peripheral angiopathy without gangrene: Secondary | ICD-10-CM

## 2015-04-10 LAB — COMPREHENSIVE METABOLIC PANEL
ALK PHOS: 86 U/L (ref 39–117)
ALT: 20 U/L (ref 0–53)
AST: 18 U/L (ref 0–37)
Albumin: 4.2 g/dL (ref 3.5–5.2)
BUN: 22 mg/dL (ref 6–23)
CALCIUM: 9.4 mg/dL (ref 8.4–10.5)
CHLORIDE: 104 meq/L (ref 96–112)
CO2: 28 mEq/L (ref 19–32)
Creatinine, Ser: 1.13 mg/dL (ref 0.40–1.50)
GFR: 68.2 mL/min (ref >60.00)
Glucose, Bld: 96 mg/dL (ref 70–99)
POTASSIUM: 4.1 meq/L (ref 3.5–5.1)
SODIUM: 137 meq/L (ref 135–145)
Total Bilirubin: 0.5 mg/dL (ref 0.2–1.2)
Total Protein: 7 g/dL (ref 6.0–8.3)

## 2015-04-10 LAB — CBC WITH DIFFERENTIAL/PLATELET
Basophils Absolute: 0 10*3/uL (ref 0.0–0.1)
Basophils Relative: 0.3 % (ref 0.0–3.0)
EOS ABS: 0.2 10*3/uL (ref 0.0–0.7)
EOS PCT: 2.6 % (ref 0.0–5.0)
HCT: 35.5 % — ABNORMAL LOW (ref 39.0–52.0)
Hemoglobin: 11.8 g/dL — ABNORMAL LOW (ref 13.0–17.0)
LYMPHS ABS: 1.2 10*3/uL (ref 0.7–4.0)
Lymphocytes Relative: 17.7 % (ref 12.0–46.0)
MCHC: 33.2 g/dL (ref 30.0–36.0)
MCV: 83.9 fl (ref 78.0–100.0)
MONO ABS: 0.5 10*3/uL (ref 0.1–1.0)
Monocytes Relative: 6.5 % (ref 3.0–12.0)
NEUTROS ABS: 5.1 10*3/uL (ref 1.4–7.7)
NEUTROS PCT: 72.9 % (ref 43.0–77.0)
Platelets: 206 10*3/uL (ref 150.0–400.0)
RBC: 4.24 Mil/uL (ref 4.22–5.81)
RDW: 14.5 % (ref 11.5–15.5)
WBC: 7 10*3/uL (ref 4.0–10.5)

## 2015-04-10 LAB — MICROALBUMIN / CREATININE URINE RATIO
CREATININE, U: 83.8 mg/dL
Microalb Creat Ratio: 1.1 mg/g (ref 0.0–30.0)
Microalb, Ur: 0.9 mg/dL (ref 0.0–1.9)

## 2015-04-10 LAB — LIPID PANEL
Cholesterol: 136 mg/dL (ref 0–200)
HDL: 36.9 mg/dL — ABNORMAL LOW (ref >39.00)
LDL Cholesterol: 77 mg/dL (ref 0–99)
NonHDL: 99.1
Total CHOL/HDL Ratio: 4
Triglycerides: 113 mg/dL (ref 0.0–149.0)
VLDL: 22.6 mg/dL (ref 0.0–40.0)

## 2015-04-10 LAB — HEMOGLOBIN A1C: HEMOGLOBIN A1C: 6.5 % (ref 4.6–6.5)

## 2015-04-10 LAB — TSH: TSH: 0.47 u[IU]/mL (ref 0.35–4.50)

## 2015-04-10 MED ORDER — METFORMIN HCL 1000 MG PO TABS: 1000.0000 mg | ORAL_TABLET | Freq: Two times a day (BID) | ORAL | Status: DC

## 2015-04-10 NOTE — Patient Instructions (Addendum)
Limit your sodium (Salt) intake    It is important that you exercise regularly, at least 20 minutes 3 to 4 times per week.  If you develop chest pain or shortness of breath seek  medical attention.   Please check your hemoglobin A1c every 3 months  Health Maintenance A healthy lifestyle and preventative care can promote health and wellness.  Maintain regular health, dental, and eye exams.  Eat a healthy diet. Foods like vegetables, fruits, whole grains, low-fat dairy products, and lean protein foods contain the nutrients you need and are low in calories. Decrease your intake of foods high in solid fats, added sugars, and salt. Get information about a proper diet from your health care provider, if necessary.  Regular physical exercise is one of the most important things you can do for your health. Most adults should get at least 150 minutes of moderate-intensity exercise (any activity that increases your heart rate and causes you to sweat) each week. In addition, most adults need muscle-strengthening exercises on 2 or more days a week.   Maintain a healthy weight. The body mass index (BMI) is a screening tool to identify possible weight problems. It provides an estimate of body fat based on height and weight. Your health care provider can find your BMI and can help you achieve or maintain a healthy weight. For males 20 years and older:  A BMI below 18.5 is considered underweight.  A BMI of 18.5 to 24.9 is normal.  A BMI of 25 to 29.9 is considered overweight.  A BMI of 30 and above is considered obese.  Maintain normal blood lipids and cholesterol by exercising and minimizing your intake of saturated fat. Eat a balanced diet with plenty of fruits and vegetables. Blood tests for lipids and cholesterol should begin at age 20 and be repeated every 5 years. If your lipid or cholesterol levels are high, you are over age 50, or you are at high risk for heart disease, you may need your cholesterol  levels checked more frequently.Ongoing high lipid and cholesterol levels should be treated with medicines if diet and exercise are not working.  If you smoke, find out from your health care provider how to quit. If you do not use tobacco, do not start.  Lung cancer screening is recommended for adults aged 55-80 years who are at high risk for developing lung cancer because of a history of smoking. A yearly low-dose CT scan of the lungs is recommended for people who have at least a 30-pack-year history of smoking and are current smokers or have quit within the past 15 years. A pack year of smoking is smoking an average of 1 pack of cigarettes a day for 1 year (for example, a 30-pack-year history of smoking could mean smoking 1 pack a day for 30 years or 2 packs a day for 15 years). Yearly screening should continue until the smoker has stopped smoking for at least 15 years. Yearly screening should be stopped for people who develop a health problem that would prevent them from having lung cancer treatment.  If you choose to drink alcohol, do not have more than 2 drinks per day. One drink is considered to be 12 oz (360 mL) of beer, 5 oz (150 mL) of wine, or 1.5 oz (45 mL) of liquor.  Avoid the use of street drugs. Do not share needles with anyone. Ask for help if you need support or instructions about stopping the use of drugs.  High blood   pressure causes heart disease and increases the risk of stroke. Blood pressure should be checked at least every 1-2 years. Ongoing high blood pressure should be treated with medicines if weight loss and exercise are not effective.  If you are 45-79 years old, ask your health care provider if you should take aspirin to prevent heart disease.  Diabetes screening involves taking a blood sample to check your fasting blood sugar level. This should be done once every 3 years after age 45 if you are at a normal weight and without risk factors for diabetes. Testing should be  considered at a younger age or be carried out more frequently if you are overweight and have at least 1 risk factor for diabetes.  Colorectal cancer can be detected and often prevented. Most routine colorectal cancer screening begins at the age of 50 and continues through age 75. However, your health care provider may recommend screening at an earlier age if you have risk factors for colon cancer. On a yearly basis, your health care provider may provide home test kits to check for hidden blood in the stool. A small camera at the end of a tube may be used to directly examine the colon (sigmoidoscopy or colonoscopy) to detect the earliest forms of colorectal cancer. Talk to your health care provider about this at age 50 when routine screening begins. A direct exam of the colon should be repeated every 5-10 years through age 75, unless early forms of precancerous polyps or small growths are found.  People who are at an increased risk for hepatitis B should be screened for this virus. You are considered at high risk for hepatitis B if:  You were born in a country where hepatitis B occurs often. Talk with your health care provider about which countries are considered high risk.  Your parents were born in a high-risk country and you have not received a shot to protect against hepatitis B (hepatitis B vaccine).  You have HIV or AIDS.  You use needles to inject street drugs.  You live with, or have sex with, someone who has hepatitis B.  You are a man who has sex with other men (MSM).  You get hemodialysis treatment.  You take certain medicines for conditions like cancer, organ transplantation, and autoimmune conditions.  Hepatitis C blood testing is recommended for all people born from 1945 through 1965 and any individual with known risk factors for hepatitis C.  Healthy men should no longer receive prostate-specific antigen (PSA) blood tests as part of routine cancer screening. Talk to your health  care provider about prostate cancer screening.  Testicular cancer screening is not recommended for adolescents or adult males who have no symptoms. Screening includes self-exam, a health care provider exam, and other screening tests. Consult with your health care provider about any symptoms you have or any concerns you have about testicular cancer.  Practice safe sex. Use condoms and avoid high-risk sexual practices to reduce the spread of sexually transmitted infections (STIs).  You should be screened for STIs, including gonorrhea and chlamydia if:  You are sexually active and are younger than 24 years.  You are older than 24 years, and your health care provider tells you that you are at risk for this type of infection.  Your sexual activity has changed since you were last screened, and you are at an increased risk for chlamydia or gonorrhea. Ask your health care provider if you are at risk.  If you are at risk   of being infected with HIV, it is recommended that you take a prescription medicine daily to prevent HIV infection. This is called pre-exposure prophylaxis (PrEP). You are considered at risk if:  You are a man who has sex with other men (MSM).  You are a heterosexual man who is sexually active with multiple partners.  You take drugs by injection.  You are sexually active with a partner who has HIV.  Talk with your health care provider about whether you are at high risk of being infected with HIV. If you choose to begin PrEP, you should first be tested for HIV. You should then be tested every 3 months for as long as you are taking PrEP.  Use sunscreen. Apply sunscreen liberally and repeatedly throughout the day. You should seek shade when your shadow is shorter than you. Protect yourself by wearing long sleeves, pants, a wide-brimmed hat, and sunglasses year round whenever you are outdoors.  Tell your health care provider of new moles or changes in moles, especially if there is a  change in shape or color. Also, tell your health care provider if a mole is larger than the size of a pencil eraser.  A one-time screening for abdominal aortic aneurysm (AAA) and surgical repair of large AAAs by ultrasound is recommended for men aged 65-75 years who are current or former smokers.  Stay current with your vaccines (immunizations). Document Released: 04/15/2008 Document Revised: 10/23/2013 Document Reviewed: 03/15/2011 Exit[employer] Patient Information 2015 [employer]. This information is not intended to replace advice given to you by your health care provider. Make sure you discuss any questions you have with your health care provider.  

## 2015-04-10 NOTE — Progress Notes (Signed)
Subjective:    Patient ID: Walter [name], male    DOB: Jan 18, 1946, 69 y.o.   MRN: [id number]  HPI   Subjective:    Patient ID: Walter [name], male    DOB: 1946-08-28, 69 y.o.   MRN: [id number]  HPI 69 year old patient who is seen today for a health maintenance examination.   He has coronary artery disease and peripheral vascular disease which has been stable. Medical regimen includes Prilosec 20 mg daily He has type 2 diabetes which has been quite stable hemoglobin A1c 6.5. He has treated hypertension and a history of dyslipidemia. Remains on simvastatin 40 mg daily. Last colonoscopy 2010  Allergies:  No Known Drug Allergies   Past History:  Past Medical History:   Coronary artery disease  Diabetes mellitus, type II  Hypertension  Hypercholesterolemia  Peripheral vascular disease  GERD   Past Surgical History:   Angioplasty 2004  sigmoidoscopy 2003  colonoscopy 2010  Family History:   Family History Other cancer-Breast  father died age 1, pneumonia  mother history breast cancer  one brother one sister in good health   Social History:  Reviewed history from 03/13/2008 and no changes required.  Former Smoker  Married  exercises at [employer] 2-4 times per week  Retired at age 69  Colonoscopy December 2010   Review of Systems  Constitutional: Negative for fever, chills, activity change, appetite change and fatigue.  HENT: Negative for congestion, dental problem, ear pain, hearing loss, mouth sores, rhinorrhea, sinus pressure, sneezing, tinnitus, trouble swallowing and voice change.   Eyes: Negative for photophobia, pain, redness and visual disturbance.  Respiratory: Negative for apnea, cough, choking, chest tightness, shortness of breath and wheezing.   Cardiovascular: Negative for chest pain, palpitations and leg swelling.  Gastrointestinal: Negative for nausea, vomiting, abdominal pain, diarrhea, constipation, blood in stool, abdominal distention, anal  bleeding and rectal pain.  Genitourinary: Negative for dysuria, urgency, frequency, hematuria, flank pain, decreased urine volume, discharge, penile swelling, scrotal swelling, difficulty urinating, genital sores and testicular pain.  Musculoskeletal: Negative for arthralgias, back pain, gait problem, joint swelling, myalgias, neck pain and neck stiffness.  Skin: Negative for color change, rash and wound.  Neurological: Negative for dizziness, tremors, seizures, syncope, facial asymmetry, speech difficulty, weakness, light-headedness, numbness and headaches.  Hematological: Negative for adenopathy. Does not bruise/bleed easily.  Psychiatric/Behavioral: Negative for suicidal ideas, hallucinations, behavioral problems, confusion, sleep disturbance, self-injury, dysphoric mood, decreased concentration and agitation. The patient is not nervous/anxious.        Objective:   Physical Exam  Constitutional: He appears well-developed and well-nourished.  HENT:  Head: Normocephalic and atraumatic.  Right Ear: External ear normal.  Left Ear: External ear normal.  Nose: Nose normal.  Mouth/Throat: Oropharynx is clear and moist.  Eyes: Conjunctivae and EOM are normal. Pupils are equal, round, and reactive to light. No scleral icterus.  Neck: Normal range of motion. Neck supple. No JVD present. No thyromegaly present.  Cardiovascular: Regular rhythm and normal heart sounds.  Exam reveals no gallop and no friction rub.   No murmur heard.  Left femoral bruit  Pulmonary/Chest: Effort normal and breath sounds normal. He exhibits no tenderness.  Abdominal: Soft. Bowel sounds are normal. He exhibits no distension and no mass. There is no tenderness.  Genitourinary: Penis normal.  Prostate +2 enlarged and benign  Musculoskeletal: Normal range of motion. He exhibits no edema and no tenderness.  Lymphadenopathy:    He has no cervical adenopathy.  Neurological: He is alert.  He has normal reflexes. No cranial  nerve deficit. Coordination normal.  Skin: Skin is warm and dry. No rash noted.  Psychiatric: He has a normal mood and affect. His behavior is normal.          Assessment & Plan:   Preventive health examination Coronary artery disease stable. We'll continue aggressive risk factor modification Hypertension well controlled Dyslipidemia controlled;  continue statin therapy  Peripheral vascular disease. Remains asymptomatic     Review of Systems     Objective:   Physical Exam  Cardiovascular:  Left dorsalis pedis pulse plus 1 left posterior tibial pulse plus 3 Pedal pulses not palpable on the right          Assessment & Plan:   Preventive health examination.  We'll check updated lab Diabetes mellitus.  Will check lipid profile, urine for microalbumin.  Annual eye examinations recommended. Hypertension, stable Dyslipidemia.  Continue atorvastatin  Recheck 6 months

## 2015-04-10 NOTE — Progress Notes (Signed)
Pre visit review using our clinic review tool, if applicable. No additional management support is needed unless otherwise documented below in the visit note. 

## 2015-05-08 ENCOUNTER — Other Ambulatory Visit: Payer: Self-pay | Admitting: Internal Medicine

## 2015-05-20 ENCOUNTER — Other Ambulatory Visit: Payer: Self-pay | Admitting: Internal Medicine

## 2015-06-03 ENCOUNTER — Other Ambulatory Visit: Payer: Self-pay | Admitting: Internal Medicine

## 2015-07-23 ENCOUNTER — Other Ambulatory Visit: Payer: Self-pay | Admitting: Internal Medicine

## 2015-09-24 ENCOUNTER — Other Ambulatory Visit: Payer: Self-pay | Admitting: Internal Medicine

## 2015-10-10 ENCOUNTER — Encounter: Payer: Self-pay | Admitting: Internal Medicine

## 2015-10-10 ENCOUNTER — Ambulatory Visit (INDEPENDENT_AMBULATORY_CARE_PROVIDER_SITE_OTHER): Payer: PPO | Admitting: Internal Medicine

## 2015-10-10 VITALS — BP 130/70 | HR 62 | Temp 97.7°F | Ht 68.5 in | Wt 177.0 lb

## 2015-10-10 DIAGNOSIS — I739 Peripheral vascular disease, unspecified: Secondary | ICD-10-CM | POA: Diagnosis not present

## 2015-10-10 DIAGNOSIS — I10 Essential (primary) hypertension: Secondary | ICD-10-CM | POA: Diagnosis not present

## 2015-10-10 DIAGNOSIS — E1151 Type 2 diabetes mellitus with diabetic peripheral angiopathy without gangrene: Secondary | ICD-10-CM

## 2015-10-10 LAB — HEMOGLOBIN A1C: Hgb A1c MFr Bld: 6.6 % — ABNORMAL HIGH (ref 4.6–6.5)

## 2015-10-10 NOTE — Progress Notes (Signed)
Pre visit review using our clinic review tool, if applicable. No additional management support is needed unless otherwise documented below in the visit note. 

## 2015-10-10 NOTE — Progress Notes (Signed)
Subjective:    Patient ID: Walter [name], male    DOB: Jun 03, 1946, 69 y.o.   MRN: [id number]  HPI  69 year old patient who is seen today in follow-up.  He has a history of diabetes, kidney by peripheral vascular disease.  He has coronary artery disease which remained stable.  He has essential hypertension.  Remains on statin therapy, which she continues to tolerate well. No complaints except for some mild resolving URI symptoms  Past Medical History  Diagnosis Date  . GERD (gastroesophageal reflux disease)   . Hypertension   . Diabetes mellitus   . Peripheral vascular disease ([place])   . Coronary artery disease   . Diverticulosis   . Esophageal stricture     Social History   Social History  . Marital Status: Married    Spouse Name: N/A  . Number of Children: N/A  . Years of Education: N/A   Occupational History  . Not on file.   Social History Main Topics  . Smoking status: Former Research scientist (life sciences)  . Smokeless tobacco: Never Used  . Alcohol Use: No  . Drug Use: No  . Sexual Activity: Not on file   Other Topics Concern  . Not on file   Social History Narrative   Daily caffeine     Past Surgical History  Procedure Laterality Date  . Coronary angioplasty with stent placement    . Shoulder surgery  2006  . Esophagogastroduodenoscopy  06/07/2012    Procedure: ESOPHAGOGASTRODUODENOSCOPY (EGD);  Surgeon: Lafayette Dragon, MD;  Location: [facility];  Service: Endoscopy;  Laterality: N/A;    Family History  Problem Relation Age of Onset  . Breast cancer Mother   . Colon cancer Neg Hx     No Known Allergies  Current Outpatient Prescriptions on File Prior to Visit  Medication Sig Dispense Refill  . amLODipine (NORVASC) 5 MG tablet TAKE ONE TABLET BY MOUTH ONCE DAILY 90 tablet 2  . aspirin 81 MG tablet Take 81 mg by mouth daily.      Marland Kitchen atorvastatin (LIPITOR) 40 MG tablet TAKE ONE TABLET BY MOUTH ONCE DAILY 90 tablet 1  . Dihydroxyaluminum Sod Carb (ROLAIDS PO) Take by  mouth as needed.    Marland Kitchen glimepiride (AMARYL) 4 MG tablet TAKE ONE TABLET BY MOUTH ONCE DAILY BEFORE BREAKFAST 90 tablet 3  . HYDROcodone-homatropine (HYCODAN) 5-1.5 MG/5ML syrup Take 5 mLs by mouth every 6 (six) hours as needed for cough. 120 mL 0  . lisinopril-hydrochlorothiazide (PRINZIDE,ZESTORETIC) 20-12.5 MG per tablet TAKE ONE TABLET BY MOUTH ONCE DAILY 90 tablet 1  . lisinopril-hydrochlorothiazide (PRINZIDE,ZESTORETIC) 20-12.5 MG per tablet TAKE ONE TABLET BY MOUTH ONCE DAILY 90 tablet 1  . metFORMIN (GLUCOPHAGE) 1000 MG tablet Take 1 tablet (1,000 mg total) by mouth 2 (two) times daily with a meal. 180 tablet 0  . metFORMIN (GLUCOPHAGE) 1000 MG tablet TAKE ONE TABLET BY MOUTH TWICE DAILY WITH  A  MEAL 180 tablet 1  . SYRINGE-NEEDLE, DISP, 3 ML (BD ECLIPSE SYRINGE) 25G X 1" 3 ML MISC 1 each by Does not apply route every 30 (thirty) days. 6 each 1  . vitamin B-12 (CYANOCOBALAMIN) 1000 MCG tablet Take 1,000 mcg by mouth daily.     No current facility-administered medications on file prior to visit.    BP 130/70 mmHg  Pulse 62  Temp(Src) 97.7 F (36.5 C) (Oral)  Ht 5' 8.5" (1.74 m)  Wt 177 lb (80.287 kg)  BMI 26.52 kg/m2     Review of  Systems  Constitutional: Negative for fever, chills, appetite change and fatigue.  HENT: Negative for congestion, dental problem, ear pain, hearing loss, sore throat, tinnitus, trouble swallowing and voice change.   Eyes: Negative for pain, discharge and visual disturbance.  Respiratory: Negative for cough, chest tightness, wheezing and stridor.   Cardiovascular: Negative for chest pain, palpitations and leg swelling.  Gastrointestinal: Negative for nausea, vomiting, abdominal pain, diarrhea, constipation, blood in stool and abdominal distention.  Genitourinary: Negative for urgency, hematuria, flank pain, discharge, difficulty urinating and genital sores.  Musculoskeletal: Negative for myalgias, back pain, joint swelling, arthralgias, gait problem and  neck stiffness.  Skin: Negative for rash.  Neurological: Negative for dizziness, syncope, speech difficulty, weakness, numbness and headaches.  Hematological: Negative for adenopathy. Does not bruise/bleed easily.  Psychiatric/Behavioral: Negative for behavioral problems and dysphoric mood. The patient is not nervous/anxious.        Objective:   Physical Exam  Constitutional: He is oriented to person, place, and time. He appears well-developed.  HENT:  Head: Normocephalic.  Right Ear: External ear normal.  Left Ear: External ear normal.  Eyes: Conjunctivae and EOM are normal.  Neck: Normal range of motion.  Cardiovascular: Normal rate and normal heart sounds.   Pulmonary/Chest: Breath sounds normal.  Abdominal: Bowel sounds are normal.  Musculoskeletal: Normal range of motion. He exhibits no edema or tenderness.  Neurological: He is alert and oriented to person, place, and time.  Psychiatric: He has a normal mood and affect. His behavior is normal.          Assessment & Plan:   Diabetes mellitus.  Will check a hemoglobin A1c.  Fasting blood sugars generally range from 105 to 1:30 Hypertension, well-controlled Dyslipidemia.  Continue statin therapy Coronary artery disease.  Continue aggressive risk factor modification  CPX 6 months

## 2015-10-10 NOTE — Patient Instructions (Signed)
Limit your sodium (Salt) intake    It is important that you exercise regularly, at least 20 minutes 3 to 4 times per week.  If you develop chest pain or shortness of breath seek  medical attention.  Return in 6 months for follow-up  

## 2015-11-11 ENCOUNTER — Other Ambulatory Visit: Payer: Self-pay | Admitting: Internal Medicine

## 2015-11-26 ENCOUNTER — Telehealth: Payer: Self-pay | Admitting: Internal Medicine

## 2015-11-26 NOTE — Telephone Encounter (Addendum)
Pt got call from [facility] that pt needs hep c test and also a flu shot. Does Dr Raliegh Ip want to order that Hep c test for pt? If not pt want to get flu on Feb 9th.  Please advsie  (FYI: [facility] is doing mass calling for pt's about flu shots and hep c this month, so we may get other calls)

## 2015-11-26 NOTE — Telephone Encounter (Signed)
Pt can come in for Flu shot but there is no vaccine for Hep C it is blood work that can be drawn at next visit with Dr. Raliegh Ip. Nothing urgent.

## 2015-11-26 NOTE — Telephone Encounter (Signed)
Pt scheduled for flu shot and aware hep c can be done at next visit

## 2015-12-11 ENCOUNTER — Ambulatory Visit (INDEPENDENT_AMBULATORY_CARE_PROVIDER_SITE_OTHER): Payer: PPO | Admitting: *Deleted

## 2015-12-11 DIAGNOSIS — Z23 Encounter for immunization: Secondary | ICD-10-CM

## 2016-01-20 ENCOUNTER — Other Ambulatory Visit: Payer: Self-pay | Admitting: Internal Medicine

## 2016-02-08 ENCOUNTER — Other Ambulatory Visit: Payer: Self-pay | Admitting: Internal Medicine

## 2016-04-09 ENCOUNTER — Ambulatory Visit: Payer: PPO | Admitting: Internal Medicine

## 2016-04-16 ENCOUNTER — Ambulatory Visit (INDEPENDENT_AMBULATORY_CARE_PROVIDER_SITE_OTHER): Payer: PPO | Admitting: Internal Medicine

## 2016-04-16 ENCOUNTER — Encounter: Payer: Self-pay | Admitting: Internal Medicine

## 2016-04-16 VITALS — BP 120/62 | HR 71 | Temp 97.7°F | Resp 18 | Ht 68.5 in | Wt 171.0 lb

## 2016-04-16 DIAGNOSIS — E538 Deficiency of other specified B group vitamins: Secondary | ICD-10-CM

## 2016-04-16 DIAGNOSIS — I10 Essential (primary) hypertension: Secondary | ICD-10-CM | POA: Diagnosis not present

## 2016-04-16 DIAGNOSIS — E1151 Type 2 diabetes mellitus with diabetic peripheral angiopathy without gangrene: Secondary | ICD-10-CM

## 2016-04-16 DIAGNOSIS — E785 Hyperlipidemia, unspecified: Secondary | ICD-10-CM | POA: Diagnosis not present

## 2016-04-16 LAB — HEMOGLOBIN A1C: HEMOGLOBIN A1C: 6.5 % (ref 4.6–6.5)

## 2016-04-16 LAB — TSH: TSH: 0.44 u[IU]/mL (ref 0.35–4.50)

## 2016-04-16 LAB — CBC WITH DIFFERENTIAL/PLATELET
BASOS ABS: 0 10*3/uL (ref 0.0–0.1)
Basophils Relative: 0.3 % (ref 0.0–3.0)
EOS ABS: 0.3 10*3/uL (ref 0.0–0.7)
Eosinophils Relative: 3.5 % (ref 0.0–5.0)
HEMATOCRIT: 36.3 % — AB (ref 39.0–52.0)
Hemoglobin: 11.9 g/dL — ABNORMAL LOW (ref 13.0–17.0)
LYMPHS PCT: 20.7 % (ref 12.0–46.0)
Lymphs Abs: 1.5 10*3/uL (ref 0.7–4.0)
MCHC: 32.8 g/dL (ref 30.0–36.0)
MCV: 84.8 fl (ref 78.0–100.0)
Monocytes Absolute: 0.6 10*3/uL (ref 0.1–1.0)
Monocytes Relative: 8 % (ref 3.0–12.0)
NEUTROS ABS: 5 10*3/uL (ref 1.4–7.7)
Neutrophils Relative %: 67.5 % (ref 43.0–77.0)
PLATELETS: 228 10*3/uL (ref 150.0–400.0)
RBC: 4.28 Mil/uL (ref 4.22–5.81)
RDW: 14.1 % (ref 11.5–15.5)
WBC: 7.4 10*3/uL (ref 4.0–10.5)

## 2016-04-16 LAB — LIPID PANEL
Cholesterol: 160 mg/dL (ref 0–200)
HDL: 41 mg/dL (ref >39.00)
LDL Cholesterol: 91 mg/dL (ref 0–99)
NonHDL: 118.64
TRIGLYCERIDES: 139 mg/dL (ref 0.0–149.0)
Total CHOL/HDL Ratio: 4
VLDL: 27.8 mg/dL (ref 0.0–40.0)

## 2016-04-16 LAB — COMPREHENSIVE METABOLIC PANEL
ALBUMIN: 4.5 g/dL (ref 3.5–5.2)
ALK PHOS: 94 U/L (ref 39–117)
ALT: 22 U/L (ref 0–53)
AST: 18 U/L (ref 0–37)
BILIRUBIN TOTAL: 0.6 mg/dL (ref 0.2–1.2)
BUN: 28 mg/dL — ABNORMAL HIGH (ref 6–23)
CALCIUM: 9.9 mg/dL (ref 8.4–10.5)
CO2: 29 mEq/L (ref 19–32)
Chloride: 103 mEq/L (ref 96–112)
Creatinine, Ser: 1.54 mg/dL — ABNORMAL HIGH (ref 0.40–1.50)
GFR: 47.57 mL/min — AB (ref >60.00)
GLUCOSE: 95 mg/dL (ref 70–99)
Potassium: 4.6 mEq/L (ref 3.5–5.1)
Sodium: 140 mEq/L (ref 135–145)
TOTAL PROTEIN: 7.2 g/dL (ref 6.0–8.3)

## 2016-04-16 LAB — MICROALBUMIN / CREATININE URINE RATIO
CREATININE, U: 265.1 mg/dL
MICROALB/CREAT RATIO: 0.5 mg/g (ref 0.0–30.0)
Microalb, Ur: 1.2 mg/dL (ref 0.0–1.9)

## 2016-04-16 LAB — VITAMIN B12: VITAMIN B 12: 373 pg/mL (ref 211–911)

## 2016-04-16 MED ORDER — HYDROCODONE-HOMATROPINE 5-1.5 MG/5ML PO SYRP: 5.0000 mL | ORAL_SOLUTION | Freq: Four times a day (QID) | ORAL | Status: DC | PRN

## 2016-04-16 NOTE — Progress Notes (Signed)
Pre visit review using our clinic review tool, if applicable. No additional management support is needed unless otherwise documented below in the visit note. 

## 2016-04-16 NOTE — Patient Instructions (Signed)
Resume monthly B12 injections  Limit your sodium (Salt) intake    It is important that you exercise regularly, at least 20 minutes 3 to 4 times per week.  If you develop chest pain or shortness of breath seek  medical attention.   Please check your hemoglobin A1c every 3-6  Months  Please check your blood pressure on a regular basis.  If it is consistently greater than 150/90, please make an office appointment.

## 2016-04-16 NOTE — Progress Notes (Signed)
Subjective:    Patient ID: Walter [name], male    DOB: 04-03-1946, 70 y.o.   MRN: [id number]  HPI  Lab Results  Component Value Date   HGBA1C 6.6* 10/10/2015   70 year old patient who is seen today for his six-month follow-up.  He has history of type 2 diabetes, essential hypertension as well as coronary artery disease.  He has been quite stable.  He also has a history of documented B12 deficiency.  He states that he has not been using monthly injections "because the injections did not make me feel better". The past couple months he has had some restless legs symptoms at night, described as a jumping sensation.  It interferes with sleep.  Otherwise, doing well.  Denies any cardiopulmonary complaints.  Past Medical History  Diagnosis Date  . GERD (gastroesophageal reflux disease)   . Hypertension   . Diabetes mellitus   . Peripheral vascular disease ([place])   . Coronary artery disease   . Diverticulosis   . Esophageal stricture      Social History   Social History  . Marital Status: Married    Spouse Name: N/A  . Number of Children: N/A  . Years of Education: N/A   Occupational History  . Not on file.   Social History Main Topics  . Smoking status: Former Research scientist (life sciences)  . Smokeless tobacco: Never Used  . Alcohol Use: No  . Drug Use: No  . Sexual Activity: Not on file   Other Topics Concern  . Not on file   Social History Narrative   Daily caffeine     Past Surgical History  Procedure Laterality Date  . Coronary angioplasty with stent placement    . Shoulder surgery  2006  . Esophagogastroduodenoscopy  06/07/2012    Procedure: ESOPHAGOGASTRODUODENOSCOPY (EGD);  Surgeon: Lafayette Dragon, MD;  Location: [facility];  Service: Endoscopy;  Laterality: N/A;    Family History  Problem Relation Age of Onset  . Breast cancer Mother   . Colon cancer Neg Hx     No Known Allergies  Current Outpatient Prescriptions on File Prior to Visit  Medication Sig Dispense Refill    . amLODipine (NORVASC) 5 MG tablet TAKE ONE TABLET BY MOUTH ONCE DAILY 90 tablet 2  . aspirin 81 MG tablet Take 81 mg by mouth daily.      Marland Kitchen atorvastatin (LIPITOR) 40 MG tablet TAKE ONE TABLET BY MOUTH ONCE DAILY 90 tablet 1  . Dihydroxyaluminum Sod Carb (ROLAIDS PO) Take by mouth as needed.    Marland Kitchen glimepiride (AMARYL) 4 MG tablet TAKE ONE TABLET BY MOUTH ONCE DAILY BEFORE BREAKFAST 90 tablet 3  . lisinopril-hydrochlorothiazide (PRINZIDE,ZESTORETIC) 20-12.5 MG per tablet TAKE ONE TABLET BY MOUTH ONCE DAILY 90 tablet 1  . metFORMIN (GLUCOPHAGE) 1000 MG tablet TAKE ONE TABLET BY MOUTH TWICE DAILY WITH FOOD 180 tablet 1  . vitamin B-12 (CYANOCOBALAMIN) 1000 MCG tablet Take 1,000 mcg by mouth daily.     No current facility-administered medications on file prior to visit.    BP 120/62 mmHg  Pulse 71  Temp(Src) 97.7 F (36.5 C) (Oral)  Resp 18  Ht 5' 8.5" (1.74 m)  Wt 171 lb (77.565 kg)  BMI 25.62 kg/m2  SpO2 97%     Review of Systems  Constitutional: Negative for fever, chills, appetite change and fatigue.  HENT: Negative for congestion, dental problem, ear pain, hearing loss, sore throat, tinnitus, trouble swallowing and voice change.   Eyes: Negative for pain, discharge  and visual disturbance.  Respiratory: Negative for cough, chest tightness, wheezing and stridor.   Cardiovascular: Negative for chest pain, palpitations and leg swelling.  Gastrointestinal: Negative for nausea, vomiting, abdominal pain, diarrhea, constipation, blood in stool and abdominal distention.  Genitourinary: Negative for urgency, hematuria, flank pain, discharge, difficulty urinating and genital sores.  Musculoskeletal: Negative for myalgias, back pain, joint swelling, arthralgias, gait problem and neck stiffness.  Skin: Negative for rash.  Neurological: Negative for dizziness, syncope, speech difficulty, weakness, numbness and headaches.  Hematological: Negative for adenopathy. Does not bruise/bleed easily.   Psychiatric/Behavioral: Negative for behavioral problems and dysphoric mood. The patient is not nervous/anxious.        Objective:   Physical Exam  Constitutional: He is oriented to person, place, and time. He appears well-developed.  HENT:  Head: Normocephalic.  Right Ear: External ear normal.  Left Ear: External ear normal.  Eyes: Conjunctivae and EOM are normal.  Neck: Normal range of motion.  Cardiovascular: Normal rate and normal heart sounds.   Pulmonary/Chest: Breath sounds normal.  Abdominal: Bowel sounds are normal.  Musculoskeletal: Normal range of motion. He exhibits no edema or tenderness.  Neurological: He is alert and oriented to person, place, and time.  Psychiatric: He has a normal mood and affect. His behavior is normal.          Assessment & Plan:   Diabetes mellitus.  Appears to be under good control.  Will check hemoglobin A1c, urine for microalbumin and lipid profile Essential hypertension, well-controlled Possible restless leg syndrome.  This has been present for only 2 months and symptoms are mild.  Will observe B12 deficiency.  We'll check a B12 level.  Compliance with the monthly B12 injections.  Discussed PAD, asymptomatic CAD stable  CPX 6 months  Nyoka Cowden, MD

## 2016-04-27 DIAGNOSIS — E119 Type 2 diabetes mellitus without complications: Secondary | ICD-10-CM | POA: Diagnosis not present

## 2016-04-27 LAB — HM DIABETES EYE EXAM

## 2016-05-06 ENCOUNTER — Encounter: Payer: Self-pay | Admitting: Internal Medicine

## 2016-06-19 ENCOUNTER — Other Ambulatory Visit: Payer: Self-pay | Admitting: Internal Medicine

## 2016-07-24 ENCOUNTER — Other Ambulatory Visit: Payer: Self-pay | Admitting: Internal Medicine

## 2016-08-10 ENCOUNTER — Other Ambulatory Visit: Payer: Self-pay | Admitting: Internal Medicine

## 2016-10-01 ENCOUNTER — Other Ambulatory Visit (INDEPENDENT_AMBULATORY_CARE_PROVIDER_SITE_OTHER): Payer: PPO

## 2016-10-01 DIAGNOSIS — Z0000 Encounter for general adult medical examination without abnormal findings: Secondary | ICD-10-CM

## 2016-10-01 LAB — BASIC METABOLIC PANEL
BUN: 15 mg/dL (ref 6–23)
CHLORIDE: 103 meq/L (ref 96–112)
CO2: 29 meq/L (ref 19–32)
Calcium: 9.2 mg/dL (ref 8.4–10.5)
Creatinine, Ser: 1.22 mg/dL (ref 0.40–1.50)
GFR: 62.16 mL/min (ref >60.00)
Glucose, Bld: 103 mg/dL — ABNORMAL HIGH (ref 70–99)
POTASSIUM: 4.3 meq/L (ref 3.5–5.1)
SODIUM: 140 meq/L (ref 135–145)

## 2016-10-01 LAB — POC URINALSYSI DIPSTICK (AUTOMATED)
Bilirubin, UA: NEGATIVE
Blood, UA: NEGATIVE
GLUCOSE UA: NEGATIVE
Ketones, UA: NEGATIVE
Leukocytes, UA: NEGATIVE
Nitrite, UA: NEGATIVE
Protein, UA: NEGATIVE
SPEC GRAV UA: 1.015
UROBILINOGEN UA: 0.2
pH, UA: 7

## 2016-10-01 LAB — CBC WITH DIFFERENTIAL/PLATELET
BASOS ABS: 0 10*3/uL (ref 0.0–0.1)
BASOS PCT: 0.3 % (ref 0.0–3.0)
Eosinophils Absolute: 0.2 10*3/uL (ref 0.0–0.7)
Eosinophils Relative: 3.4 % (ref 0.0–5.0)
HEMATOCRIT: 36.4 % — AB (ref 39.0–52.0)
Hemoglobin: 12.3 g/dL — ABNORMAL LOW (ref 13.0–17.0)
LYMPHS PCT: 19.2 % (ref 12.0–46.0)
Lymphs Abs: 1.4 10*3/uL (ref 0.7–4.0)
MCHC: 33.7 g/dL (ref 30.0–36.0)
MCV: 84.2 fl (ref 78.0–100.0)
MONOS PCT: 6.3 % (ref 3.0–12.0)
Monocytes Absolute: 0.5 10*3/uL (ref 0.1–1.0)
NEUTROS ABS: 5.1 10*3/uL (ref 1.4–7.7)
Neutrophils Relative %: 70.8 % (ref 43.0–77.0)
PLATELETS: 233 10*3/uL (ref 150.0–400.0)
RBC: 4.32 Mil/uL (ref 4.22–5.81)
RDW: 14.1 % (ref 11.5–15.5)
WBC: 7.2 10*3/uL (ref 4.0–10.5)

## 2016-10-01 LAB — LIPID PANEL
CHOL/HDL RATIO: 4
Cholesterol: 152 mg/dL (ref 0–200)
HDL: 40.8 mg/dL (ref >39.00)
LDL CALC: 78 mg/dL (ref 0–99)
NonHDL: 110.76
TRIGLYCERIDES: 166 mg/dL — AB (ref 0.0–149.0)
VLDL: 33.2 mg/dL (ref 0.0–40.0)

## 2016-10-01 LAB — HEMOGLOBIN A1C: HEMOGLOBIN A1C: 6.3 % (ref 4.6–6.5)

## 2016-10-01 LAB — HEPATIC FUNCTION PANEL
ALBUMIN: 4.2 g/dL (ref 3.5–5.2)
ALK PHOS: 86 U/L (ref 39–117)
ALT: 22 U/L (ref 0–53)
AST: 18 U/L (ref 0–37)
Bilirubin, Direct: 0.1 mg/dL (ref 0.0–0.3)
TOTAL PROTEIN: 6.9 g/dL (ref 6.0–8.3)
Total Bilirubin: 0.6 mg/dL (ref 0.2–1.2)

## 2016-10-01 LAB — MICROALBUMIN / CREATININE URINE RATIO
Creatinine,U: 154.5 mg/dL
MICROALB/CREAT RATIO: 0.5 mg/g (ref 0.0–30.0)
Microalb, Ur: <0.7 mg/dL (ref 0.0–1.9)

## 2016-10-01 LAB — TSH: TSH: 0.61 u[IU]/mL (ref 0.35–4.50)

## 2016-10-06 ENCOUNTER — Encounter: Payer: Self-pay | Admitting: Internal Medicine

## 2016-10-06 ENCOUNTER — Ambulatory Visit (INDEPENDENT_AMBULATORY_CARE_PROVIDER_SITE_OTHER): Payer: PPO | Admitting: Internal Medicine

## 2016-10-06 VITALS — BP 140/60 | HR 76 | Temp 97.7°F | Ht 68.0 in | Wt 167.2 lb

## 2016-10-06 DIAGNOSIS — D1801 Hemangioma of skin and subcutaneous tissue: Secondary | ICD-10-CM | POA: Diagnosis not present

## 2016-10-06 DIAGNOSIS — Z0000 Encounter for general adult medical examination without abnormal findings: Secondary | ICD-10-CM

## 2016-10-06 DIAGNOSIS — L821 Other seborrheic keratosis: Secondary | ICD-10-CM | POA: Diagnosis not present

## 2016-10-06 DIAGNOSIS — Z23 Encounter for immunization: Secondary | ICD-10-CM

## 2016-10-06 DIAGNOSIS — B353 Tinea pedis: Secondary | ICD-10-CM | POA: Diagnosis not present

## 2016-10-06 DIAGNOSIS — L570 Actinic keratosis: Secondary | ICD-10-CM | POA: Diagnosis not present

## 2016-10-06 DIAGNOSIS — L820 Inflamed seborrheic keratosis: Secondary | ICD-10-CM | POA: Diagnosis not present

## 2016-10-06 DIAGNOSIS — Z85828 Personal history of other malignant neoplasm of skin: Secondary | ICD-10-CM | POA: Diagnosis not present

## 2016-10-06 MED ORDER — HYDROCODONE-HOMATROPINE 5-1.5 MG/5ML PO SYRP: 5.0000 mL | ORAL_SOLUTION | Freq: Four times a day (QID) | ORAL | 0 refills | Status: DC | PRN

## 2016-10-06 NOTE — Patient Instructions (Addendum)
Limit your sodium (Salt) intake   Please check your hemoglobin A1c every 6 months    It is important that you exercise regularly, at least 20 minutes 3 to 4 times per week.  If you develop chest pain or shortness of breath seek  medical attention.  Please check your blood pressure on a regular basis.  If it is consistently greater than 150/90, please make an office appointment.  Return in 6 months for follow-up  Please see your eye doctor yearly to check for diabetic eye damage

## 2016-10-06 NOTE — Progress Notes (Signed)
Subjective:    Patient ID: Walter [name], male    DOB: 1946/04/19, 70 y.o.   MRN: [id number]  HPI  70 year old patient who is seen today for a preventive health examination and Medicare wellness visit. He continues to do quite well.  He does check blood sugars daily with a nice glycemic control.  He has a history of essential hypertension as well as coronary and peripheral arterial disease.  He denies any claudication or ischemic chest pain. He has a history of B12 deficiency controlled with oral medication at this time  Past Medical History:  Diagnosis Date  . Coronary artery disease   . Diabetes mellitus   . Diverticulosis   . Esophageal stricture   . GERD (gastroesophageal reflux disease)   . Hypertension   . Peripheral vascular disease [facility]      Social History   Social History  . Marital status: Married    Spouse name: N/A  . Number of children: N/A  . Years of education: N/A   Occupational History  . Not on file.   Social History Main Topics  . Smoking status: Former Research scientist (life sciences)  . Smokeless tobacco: Never Used  . Alcohol use No  . Drug use: No  . Sexual activity: Not on file   Other Topics Concern  . Not on file   Social History Narrative   Daily caffeine     Past Surgical History:  Procedure Laterality Date  . CORONARY ANGIOPLASTY WITH STENT PLACEMENT    . ESOPHAGOGASTRODUODENOSCOPY  06/07/2012   Procedure: ESOPHAGOGASTRODUODENOSCOPY (EGD);  Surgeon: Lafayette Dragon, MD;  Location: [facility];  Service: Endoscopy;  Laterality: N/A;  . SHOULDER SURGERY  2006    Family History  Problem Relation Age of Onset  . Breast cancer Mother   . Colon cancer Neg Hx     No Known Allergies  Current Outpatient Prescriptions on File Prior to Visit  Medication Sig Dispense Refill  . amLODipine (NORVASC) 5 MG tablet TAKE ONE TABLET BY MOUTH ONCE DAILY 90 tablet 2  . aspirin 81 MG tablet Take 81 mg by mouth daily.      Marland Kitchen atorvastatin (LIPITOR) 40 MG tablet TAKE  ONE TABLET BY MOUTH ONCE DAILY 90 tablet 1  . Dihydroxyaluminum Sod Carb (ROLAIDS PO) Take by mouth as needed.    Marland Kitchen glimepiride (AMARYL) 4 MG tablet TAKE ONE TABLET BY MOUTH ONCE DAILY BEFORE BREAKFAST 90 tablet 3  . lisinopril-hydrochlorothiazide (PRINZIDE,ZESTORETIC) 20-12.5 MG per tablet TAKE ONE TABLET BY MOUTH ONCE DAILY 90 tablet 1  . lisinopril-hydrochlorothiazide (PRINZIDE,ZESTORETIC) 20-12.5 MG tablet TAKE ONE TABLET BY MOUTH ONCE DAILY 90 tablet 1  . metFORMIN (GLUCOPHAGE) 1000 MG tablet TAKE ONE TABLET BY MOUTH TWICE DAILY WITH FOOD 180 tablet 1  . vitamin B-12 (CYANOCOBALAMIN) 1000 MCG tablet Take 1,000 mcg by mouth daily.     No current facility-administered medications on file prior to visit.     BP 140/60 (BP Location: Left Arm, Patient Position: Sitting, Cuff Size: Normal)   Pulse 76   Temp 97.7 F (36.5 C) (Oral)   Ht 5\' 8"  (1.727 m)   Wt 167 lb 4 oz (75.9 kg)   SpO2 98%   BMI 25.43 kg/m   Medicare wellness visit  1. Risk factors, based on past  M,S,F history.  Patient has known coronary artery and peripheral last crit disease.  Cardio vascular risk factors include diabetes hypertension and dyslipidemia.  He also has a history of remote tobacco use  2.  Physical activities:no exercise limitations.  Does work out 4-5 times per week on his bicycle  3.  Depression/mood:no history of major depression or mood disorder  4.  Hearing:mild deficits  5.  ADL's:independent  6.  Fall risk:low  7.  Home safety:no problems identified  8.  Height weight, and visual acuity;height and weight stable no change in visual acuity does see his ophthalmologist and evening.  Annually  9.  Counseling:continue heart healthy diet and regular exercise regimen  10. Lab orders based on risk factors:laboratory studies reviewed including hemoglobin A1c, lipid profile and urine for microalbumin  11. Referral :not appropriate at this time  12. Care plan:continue efforts at aggressive risk  factor modification  13. Cognitive assessment: alert and oriented with normal affect no cognitive dysfunction  14. Screening: Patient provided with a written and personalized 5-10 year screening schedule in the AVS.    15. Provider List Update: primary care ophthalmology    Review of Systems  Constitutional: Negative for appetite change, chills, fatigue and fever.  HENT: Negative for congestion, dental problem, ear pain, hearing loss, sore throat, tinnitus, trouble swallowing and voice change.   Eyes: Negative for pain, discharge and visual disturbance.  Respiratory: Negative for cough, chest tightness, wheezing and stridor.   Cardiovascular: Negative for chest pain, palpitations and leg swelling.  Gastrointestinal: Negative for abdominal distention, abdominal pain, blood in stool, constipation, diarrhea, nausea and vomiting.  Genitourinary: Negative for difficulty urinating, discharge, flank pain, genital sores, hematuria and urgency.  Musculoskeletal: Negative for arthralgias, back pain, gait problem, joint swelling, myalgias and neck stiffness.  Skin: Negative for rash.  Neurological: Negative for dizziness, syncope, speech difficulty, weakness, numbness and headaches.  Hematological: Negative for adenopathy. Does not bruise/bleed easily.  Psychiatric/Behavioral: Negative for behavioral problems and dysphoric mood. The patient is not nervous/anxious.        Objective:   Physical Exam  Constitutional: He appears well-developed and well-nourished.  Blood pressure low normal and symmetrical  HENT:  Head: Normocephalic and atraumatic.  Right Ear: External ear normal.  Left Ear: External ear normal.  Nose: Nose normal.  Mouth/Throat: Oropharynx is clear and moist.  Eyes: Conjunctivae and EOM are normal. Pupils are equal, round, and reactive to light. No scleral icterus.  Neck: Normal range of motion. Neck supple. No JVD present. No thyromegaly present.  Cardiovascular: Regular  rhythm, normal heart sounds and intact distal pulses.  Exam reveals no gallop and no friction rub.   No murmur heard. Left femoral bruit Left posterior tibial pulses full.  Other pedal pulses not easily palpable  Pulmonary/Chest: Effort normal and breath sounds normal. He exhibits no tenderness.  Abdominal: Soft. Bowel sounds are normal. He exhibits no distension and no mass. There is no tenderness.  Small ventral hernia  Genitourinary: Penis normal. Rectal exam shows guaiac negative stool.  Genitourinary Comments: Prostate plus 2 and benign  Musculoskeletal: Normal range of motion. He exhibits no edema or tenderness.  Lymphadenopathy:    He has no cervical adenopathy.  Neurological: He is alert. He has normal reflexes. No cranial nerve deficit. Coordination normal.  Skin: Skin is warm and dry. No rash noted.  Senile skin changes as well as surgical scars  Psychiatric: He has a normal mood and affect. His behavior is normal.          Assessment & Plan:   Preventive health examination Medicare wellness visit Essential hypertension, well-controlled Diabetes mellitus, controlled Coronary artery disease, stable PAD, stable.  Continue efforts at aggressive risk factor  modification Vitamin B12 deficiency.  Continue oral supplements  Follow-up 6 months Laboratory studies reviewed and discussed Flu vaccine administered  Nyoka Cowden

## 2016-10-06 NOTE — Progress Notes (Signed)
Pre visit review using our clinic review tool, if applicable. No additional management support is needed unless otherwise documented below in the visit note. 

## 2016-11-15 DIAGNOSIS — H02051 Trichiasis without entropian right upper eyelid: Secondary | ICD-10-CM | POA: Diagnosis not present

## 2016-12-14 ENCOUNTER — Other Ambulatory Visit: Payer: Self-pay | Admitting: Internal Medicine

## 2017-01-29 ENCOUNTER — Other Ambulatory Visit: Payer: Self-pay | Admitting: Internal Medicine

## 2017-01-31 ENCOUNTER — Encounter: Payer: Self-pay | Admitting: Internal Medicine

## 2017-01-31 ENCOUNTER — Ambulatory Visit (INDEPENDENT_AMBULATORY_CARE_PROVIDER_SITE_OTHER): Payer: PPO | Admitting: Internal Medicine

## 2017-01-31 VITALS — BP 128/72 | HR 85 | Temp 97.7°F | Ht 68.0 in | Wt 164.0 lb

## 2017-01-31 DIAGNOSIS — I10 Essential (primary) hypertension: Secondary | ICD-10-CM | POA: Diagnosis not present

## 2017-01-31 DIAGNOSIS — J209 Acute bronchitis, unspecified: Secondary | ICD-10-CM

## 2017-01-31 DIAGNOSIS — E1151 Type 2 diabetes mellitus with diabetic peripheral angiopathy without gangrene: Secondary | ICD-10-CM

## 2017-01-31 LAB — HEMOGLOBIN A1C: HEMOGLOBIN A1C: 6.3 % (ref 4.6–6.5)

## 2017-01-31 MED ORDER — HYDROCODONE-HOMATROPINE 5-1.5 MG/5ML PO SYRP: 5.0000 mL | ORAL_SOLUTION | Freq: Four times a day (QID) | ORAL | 0 refills | Status: DC | PRN

## 2017-01-31 MED ORDER — AZITHROMYCIN 250 MG PO TABS: ORAL_TABLET | ORAL | 0 refills | Status: DC

## 2017-01-31 NOTE — Progress Notes (Signed)
Pre visit review using our clinic review tool, if applicable. No additional management support is needed unless otherwise documented below in the visit note. 

## 2017-01-31 NOTE — Progress Notes (Signed)
Subjective:    Patient ID: Walter [name], male    DOB: 02-05-1946, 71 y.o.   MRN: [id number]  HPI 71 year old patient who has a history of type 2 diabetes and essential hypertension. He presents today with a chief complaint of cough.  He is a productive cough yielding green sputum for the past 6 weeks.  He generally feels unwell.  No wheezing or shortness of breath.  Lab Results  Component Value Date   HGBA1C 6.3 10/01/2016   Past Medical History:  Diagnosis Date  . Coronary artery disease   . Diabetes mellitus   . Diverticulosis   . Esophageal stricture   . GERD (gastroesophageal reflux disease)   . Hypertension   . Peripheral vascular disease [facility])      Social History   Social History  . Marital status: Married    Spouse name: N/A  . Number of children: N/A  . Years of education: N/A   Occupational History  . Not on file.   Social History Main Topics  . Smoking status: Former Research scientist (life sciences)  . Smokeless tobacco: Never Used  . Alcohol use No  . Drug use: No  . Sexual activity: Not on file   Other Topics Concern  . Not on file   Social History Narrative   Daily caffeine     Past Surgical History:  Procedure Laterality Date  . CORONARY ANGIOPLASTY WITH STENT PLACEMENT    . ESOPHAGOGASTRODUODENOSCOPY  06/07/2012   Procedure: ESOPHAGOGASTRODUODENOSCOPY (EGD);  Surgeon: Lafayette Dragon, MD;  Location: [facility];  Service: Endoscopy;  Laterality: N/A;  . SHOULDER SURGERY  2006    Family History  Problem Relation Age of Onset  . Breast cancer Mother   . Colon cancer Neg Hx     No Known Allergies  Current Outpatient Prescriptions on File Prior to Visit  Medication Sig Dispense Refill  . amLODipine (NORVASC) 5 MG tablet TAKE ONE TABLET BY MOUTH ONCE DAILY 90 tablet 2  . aspirin 81 MG tablet Take 81 mg by mouth daily.      Marland Kitchen atorvastatin (LIPITOR) 40 MG tablet TAKE ONE TABLET BY MOUTH ONCE DAILY 90 tablet 1  . Dihydroxyaluminum Sod Carb (ROLAIDS PO) Take by  mouth as needed.    Marland Kitchen glimepiride (AMARYL) 4 MG tablet TAKE ONE TABLET BY MOUTH ONCE DAILY BEFORE BREAKFAST 90 tablet 3  . lisinopril-hydrochlorothiazide (PRINZIDE,ZESTORETIC) 20-12.5 MG per tablet TAKE ONE TABLET BY MOUTH ONCE DAILY 90 tablet 1  . lisinopril-hydrochlorothiazide (PRINZIDE,ZESTORETIC) 20-12.5 MG tablet TAKE ONE TABLET BY MOUTH ONCE DAILY 90 tablet 1  . metFORMIN (GLUCOPHAGE) 1000 MG tablet TAKE ONE TABLET BY MOUTH TWICE DAILY WITH FOOD 180 tablet 1  . vitamin B-12 (CYANOCOBALAMIN) 1000 MCG tablet Take 1,000 mcg by mouth daily.    Marland Kitchen HYDROcodone-homatropine (HYCODAN) 5-1.5 MG/5ML syrup Take 5 mLs by mouth every 6 (six) hours as needed for cough. (Patient not taking: Reported on 01/31/2017) 120 mL 0   No current facility-administered medications on file prior to visit.     BP 128/72 (BP Location: Left Arm, Patient Position: Sitting, Cuff Size: Normal)   Pulse 85   Temp 97.7 F (36.5 C) (Oral)   Ht 5\' 8"  (1.727 m)   Wt 164 lb (74.4 kg)   SpO2 98%   BMI 24.94 kg/m      Review of Systems  Constitutional: Positive for activity change and fatigue. Negative for appetite change, chills and fever.  HENT: Negative for congestion, dental problem, ear pain, hearing  loss, sore throat, tinnitus, trouble swallowing and voice change.   Eyes: Negative for pain, discharge and visual disturbance.  Respiratory: Positive for cough. Negative for chest tightness, wheezing and stridor.   Cardiovascular: Negative for chest pain, palpitations and leg swelling.  Gastrointestinal: Negative for abdominal distention, abdominal pain, blood in stool, constipation, diarrhea, nausea and vomiting.  Genitourinary: Negative for difficulty urinating, discharge, flank pain, genital sores, hematuria and urgency.  Musculoskeletal: Negative for arthralgias, back pain, gait problem, joint swelling, myalgias and neck stiffness.  Skin: Negative for rash.  Neurological: Positive for weakness. Negative for dizziness,  syncope, speech difficulty, numbness and headaches.  Hematological: Negative for adenopathy. Does not bruise/bleed easily.  Psychiatric/Behavioral: Negative for behavioral problems and dysphoric mood. The patient is not nervous/anxious.        Objective:   Physical Exam  Constitutional: He is oriented to person, place, and time. He appears well-developed.   Afebrile Appears unwell, but in no acute distress Wearing a mask  HENT:  Head: Normocephalic.  Right Ear: External ear normal.  Left Ear: External ear normal.  Eyes: Conjunctivae and EOM are normal.  Neck: Normal range of motion.  Cardiovascular: Normal rate and normal heart sounds.   Pulmonary/Chest: Breath sounds normal. No respiratory distress. He has no wheezes. He has no rales.  Abdominal: Bowel sounds are normal.  Musculoskeletal: Normal range of motion. He exhibits no edema or tenderness.  Neurological: He is alert and oriented to person, place, and time.  Psychiatric: He has a normal mood and affect. His behavior is normal.          Assessment & Plan:   Refractory bronchitis.  Will treat with expectorants, hydration, and azithromycin Essential hypertension .  Well-controlled Diabetes.  Will check hemoglobin A1c    Follow-up 3 months  Nyoka Cowden

## 2017-01-31 NOTE — Patient Instructions (Addendum)
Take over-the-counter expectorants and cough medications such as  Mucinex DM.  Call if there is no improvement in 5 to 7 days or if  you develop worsening cough, fever, or new symptoms, such as shortness of breath or chest pain.  Take your antibiotic as prescribed until ALL of it is gone, but stop if you develop a rash, swelling, or any side effects of the medication.  Contact our office as soon as possible if  there are side effects of the medication.  Hydrate and Humidify  Drink enough water to keep your urine clear or pale yellow. Staying hydrated will help to thin your mucus.  Use a cool mist humidifier to keep the humidity level in your home above 50%.  Inhale steam for 10-15 minutes, 3-4 times a day or as told by your health care provider. You can do this in the bathroom while a hot shower is running.  Limit your exposure to cool or dry air. Rest  Rest as much as possible.  

## 2017-02-10 ENCOUNTER — Other Ambulatory Visit: Payer: Self-pay | Admitting: Internal Medicine

## 2017-03-13 ENCOUNTER — Other Ambulatory Visit: Payer: Self-pay | Admitting: Internal Medicine

## 2017-04-06 ENCOUNTER — Ambulatory Visit: Payer: PPO | Admitting: Internal Medicine

## 2017-04-19 DIAGNOSIS — H43813 Vitreous degeneration, bilateral: Secondary | ICD-10-CM | POA: Diagnosis not present

## 2017-04-19 DIAGNOSIS — H524 Presbyopia: Secondary | ICD-10-CM | POA: Diagnosis not present

## 2017-04-19 DIAGNOSIS — H31001 Unspecified chorioretinal scars, right eye: Secondary | ICD-10-CM | POA: Diagnosis not present

## 2017-04-19 DIAGNOSIS — H5213 Myopia, bilateral: Secondary | ICD-10-CM | POA: Diagnosis not present

## 2017-04-19 DIAGNOSIS — E119 Type 2 diabetes mellitus without complications: Secondary | ICD-10-CM | POA: Diagnosis not present

## 2017-04-19 DIAGNOSIS — H02051 Trichiasis without entropian right upper eyelid: Secondary | ICD-10-CM | POA: Diagnosis not present

## 2017-04-19 DIAGNOSIS — H3540 Unspecified peripheral retinal degeneration: Secondary | ICD-10-CM | POA: Diagnosis not present

## 2017-04-19 DIAGNOSIS — Z7984 Long term (current) use of oral hypoglycemic drugs: Secondary | ICD-10-CM | POA: Diagnosis not present

## 2017-04-19 DIAGNOSIS — H52223 Regular astigmatism, bilateral: Secondary | ICD-10-CM | POA: Diagnosis not present

## 2017-04-19 LAB — HM DIABETES EYE EXAM

## 2017-05-05 ENCOUNTER — Encounter: Payer: Self-pay | Admitting: Family Medicine

## 2017-05-06 ENCOUNTER — Ambulatory Visit (INDEPENDENT_AMBULATORY_CARE_PROVIDER_SITE_OTHER): Payer: PPO | Admitting: Internal Medicine

## 2017-05-06 ENCOUNTER — Encounter: Payer: Self-pay | Admitting: Internal Medicine

## 2017-05-06 VITALS — BP 120/64 | HR 66 | Temp 97.6°F | Wt 166.8 lb

## 2017-05-06 DIAGNOSIS — I10 Essential (primary) hypertension: Secondary | ICD-10-CM

## 2017-05-06 DIAGNOSIS — E1151 Type 2 diabetes mellitus with diabetic peripheral angiopathy without gangrene: Secondary | ICD-10-CM

## 2017-05-06 DIAGNOSIS — I2510 Atherosclerotic heart disease of native coronary artery without angina pectoris: Secondary | ICD-10-CM

## 2017-05-06 DIAGNOSIS — E538 Deficiency of other specified B group vitamins: Secondary | ICD-10-CM

## 2017-05-06 MED ORDER — HYDROCODONE-HOMATROPINE 5-1.5 MG/5ML PO SYRP: 5.0000 mL | ORAL_SOLUTION | Freq: Four times a day (QID) | ORAL | 0 refills | Status: DC | PRN

## 2017-05-06 MED ORDER — LOSARTAN POTASSIUM-HCTZ 100-12.5 MG PO TABS: 1.0000 | ORAL_TABLET | Freq: Every day | ORAL | 3 refills | Status: DC

## 2017-05-06 NOTE — Patient Instructions (Signed)
Limit your sodium (Salt) intake  Discontinue lisinopril and start losartan hydrochlorothiazide  Return in 3 months for follow-up and annual exam

## 2017-05-06 NOTE — Progress Notes (Signed)
Subjective:    Patient ID: Walter [name], male    DOB: 1946/09/11, 71 y.o.   MRN: [id number]  HPI 71 year old patient who is seen today for follow-up.  He has a history of type 2 diabetes which has been well controlled on oral medications.  Hemoglobin A1c's have generally been in a nondiabetic range He has essential hypertension. He has a history of coronary artery disease as well as PAD.  No cardiac symptoms. He has B12 deficiency, controlled on oral supplements.  He still has some chest congestion and chronic cough.  He has required hydrocodone cough medications   Past Medical History:  Diagnosis Date  . Coronary artery disease   . Diabetes mellitus   . Diverticulosis   . Esophageal stricture   . GERD (gastroesophageal reflux disease)   . Hypertension   . Peripheral vascular disease [facility])      Social History   Social History  . Marital status: Married    Spouse name: N/A  . Number of children: N/A  . Years of education: N/A   Occupational History  . Not on file.   Social History Main Topics  . Smoking status: Former Research scientist (life sciences)  . Smokeless tobacco: Never Used  . Alcohol use No  . Drug use: No  . Sexual activity: Not on file   Other Topics Concern  . Not on file   Social History Narrative   Daily caffeine     Past Surgical History:  Procedure Laterality Date  . CORONARY ANGIOPLASTY WITH STENT PLACEMENT    . ESOPHAGOGASTRODUODENOSCOPY  06/07/2012   Procedure: ESOPHAGOGASTRODUODENOSCOPY (EGD);  Surgeon: Lafayette Dragon, MD;  Location: [facility];  Service: Endoscopy;  Laterality: N/A;  . SHOULDER SURGERY  2006    Family History  Problem Relation Age of Onset  . Breast cancer Mother   . Colon cancer Neg Hx     No Known Allergies  Current Outpatient Prescriptions on File Prior to Visit  Medication Sig Dispense Refill  . amLODipine (NORVASC) 5 MG tablet TAKE ONE TABLET BY MOUTH ONCE DAILY 90 tablet 2  . aspirin 81 MG tablet Take 81 mg by mouth daily.       Marland Kitchen atorvastatin (LIPITOR) 40 MG tablet TAKE ONE TABLET BY MOUTH ONCE DAILY 90 tablet 1  . azithromycin (ZITHROMAX) 250 MG tablet 2 tablets once daily for 3 consecutive days 6 tablet 0  . Dihydroxyaluminum Sod Carb (ROLAIDS PO) Take by mouth as needed.    Marland Kitchen glimepiride (AMARYL) 4 MG tablet TAKE ONE TABLET BY MOUTH ONCE DAILY BEFORE BREAKFAST 90 tablet 3  . HYDROcodone-homatropine (HYCODAN) 5-1.5 MG/5ML syrup Take 5 mLs by mouth every 6 (six) hours as needed for cough. 120 mL 0  . HYDROcodone-homatropine (HYCODAN) 5-1.5 MG/5ML syrup Take 5 mLs by mouth every 6 (six) hours as needed for cough. 120 mL 0  . lisinopril-hydrochlorothiazide (PRINZIDE,ZESTORETIC) 20-12.5 MG per tablet TAKE ONE TABLET BY MOUTH ONCE DAILY 90 tablet 1  . lisinopril-hydrochlorothiazide (PRINZIDE,ZESTORETIC) 20-12.5 MG tablet TAKE ONE TABLET BY MOUTH ONCE DAILY 90 tablet 1  . metFORMIN (GLUCOPHAGE) 1000 MG tablet TAKE ONE TABLET BY MOUTH TWICE DAILY WITH FOOD 180 tablet 1  . vitamin B-12 (CYANOCOBALAMIN) 1000 MCG tablet Take 1,000 mcg by mouth daily.     No current facility-administered medications on file prior to visit.     BP 120/64 (BP Location: Left Arm, Patient Position: Sitting, Cuff Size: Normal)   Pulse 66   Temp 97.6 F (36.4 C) (Oral)  Wt 166 lb 12.8 oz (75.7 kg)   SpO2 97%   BMI 25.36 kg/m      Review of Systems  Constitutional: Negative for appetite change, chills, fatigue and fever.  HENT: Positive for postnasal drip and rhinorrhea. Negative for congestion, dental problem, ear pain, hearing loss, sore throat, tinnitus, trouble swallowing and voice change.   Eyes: Negative for pain, discharge and visual disturbance.  Respiratory: Positive for cough. Negative for chest tightness, wheezing and stridor.   Cardiovascular: Negative for chest pain, palpitations and leg swelling.  Gastrointestinal: Negative for abdominal distention, abdominal pain, blood in stool, constipation, diarrhea, nausea and  vomiting.  Genitourinary: Negative for difficulty urinating, discharge, flank pain, genital sores, hematuria and urgency.  Musculoskeletal: Negative for arthralgias, back pain, gait problem, joint swelling, myalgias and neck stiffness.  Skin: Negative for rash.  Neurological: Negative for dizziness, syncope, speech difficulty, weakness, numbness and headaches.  Hematological: Negative for adenopathy. Does not bruise/bleed easily.  Psychiatric/Behavioral: Negative for behavioral problems and dysphoric mood. The patient is not nervous/anxious.        Objective:   Physical Exam  Constitutional: He is oriented to person, place, and time. He appears well-developed.  HENT:  Head: Normocephalic.  Right Ear: External ear normal.  Left Ear: External ear normal.  Eyes: Conjunctivae and EOM are normal.  Neck: Normal range of motion.  Cardiovascular: Normal rate and normal heart sounds.   Pulmonary/Chest: Breath sounds normal.  Rare scattered rhonchi  Abdominal: Bowel sounds are normal.  Musculoskeletal: Normal range of motion. He exhibits no edema or tenderness.  Neurological: He is alert and oriented to person, place, and time.  Psychiatric: He has a normal mood and affect. His behavior is normal.          Assessment & Plan:   Essential hypertension.  Patient has intermittent cough, probably at least aggravated by ACE inhibitor and.  We'll switch to losartan Essential hypertension, stable Diabetes mellitus.  Will check hemoglobin A1c in 3 months at the time of his annual exam Dyslipidemia.  Continue statin therapy Coronary artery disease  [id number],PETER Pilar Plate

## 2017-07-21 ENCOUNTER — Encounter: Payer: Self-pay | Admitting: Internal Medicine

## 2017-07-30 ENCOUNTER — Other Ambulatory Visit: Payer: Self-pay | Admitting: Internal Medicine

## 2017-08-04 ENCOUNTER — Encounter: Payer: Self-pay | Admitting: Internal Medicine

## 2017-08-04 ENCOUNTER — Ambulatory Visit (INDEPENDENT_AMBULATORY_CARE_PROVIDER_SITE_OTHER): Payer: PPO | Admitting: Internal Medicine

## 2017-08-04 VITALS — BP 128/58 | HR 75 | Temp 97.8°F | Ht 68.0 in | Wt 165.6 lb

## 2017-08-04 DIAGNOSIS — I2510 Atherosclerotic heart disease of native coronary artery without angina pectoris: Secondary | ICD-10-CM | POA: Diagnosis not present

## 2017-08-04 DIAGNOSIS — K222 Esophageal obstruction: Secondary | ICD-10-CM

## 2017-08-04 DIAGNOSIS — Z23 Encounter for immunization: Secondary | ICD-10-CM

## 2017-08-04 DIAGNOSIS — I10 Essential (primary) hypertension: Secondary | ICD-10-CM

## 2017-08-04 DIAGNOSIS — E538 Deficiency of other specified B group vitamins: Secondary | ICD-10-CM

## 2017-08-04 DIAGNOSIS — E1151 Type 2 diabetes mellitus with diabetic peripheral angiopathy without gangrene: Secondary | ICD-10-CM

## 2017-08-04 LAB — HEMOGLOBIN A1C: HEMOGLOBIN A1C: 6.4 % (ref 4.6–6.5)

## 2017-08-04 NOTE — Progress Notes (Signed)
Subjective:    Patient ID: Walter [name], male    DOB: 06-10-1946, 71 y.o.   MRN: [id number]  HPI  71 year old patient who is seen today for follow-up of type 2 diabetes.  He continues to do well without concerns or complaints.  He has coronary artery disease which has been stable.  He is treated for essential hypertension and dyslipidemia. He has B12 deficiency and remains on B12 supplements. His last hemoglobin A1cs have been 6.3  POC hemoglobin A1c today 5.3  Past Medical History:  Diagnosis Date  . Coronary artery disease   . Diabetes mellitus   . Diverticulosis   . Esophageal stricture   . GERD (gastroesophageal reflux disease)   . Hypertension   . Peripheral vascular disease [facility])      Social History   Social History  . Marital status: Married    Spouse name: N/A  . Number of children: N/A  . Years of education: N/A   Occupational History  . Not on file.   Social History Main Topics  . Smoking status: Former Research scientist (life sciences)  . Smokeless tobacco: Never Used  . Alcohol use No  . Drug use: No  . Sexual activity: Not on file   Other Topics Concern  . Not on file   Social History Narrative   Daily caffeine     Past Surgical History:  Procedure Laterality Date  . CORONARY ANGIOPLASTY WITH STENT PLACEMENT    . ESOPHAGOGASTRODUODENOSCOPY  06/07/2012   Procedure: ESOPHAGOGASTRODUODENOSCOPY (EGD);  Surgeon: Walter Dragon, MD;  Location: [facility];  Service: Endoscopy;  Laterality: N/A;  . SHOULDER SURGERY  2006    Family History  Problem Relation Age of Onset  . Breast cancer Mother   . Colon cancer Neg Hx     No Known Allergies  Current Outpatient Prescriptions on File Prior to Visit  Medication Sig Dispense Refill  . amLODipine (NORVASC) 5 MG tablet TAKE ONE TABLET BY MOUTH ONCE DAILY 90 tablet 2  . aspirin 81 MG tablet Take 81 mg by mouth daily.      Marland Kitchen atorvastatin (LIPITOR) 40 MG tablet TAKE ONE TABLET BY MOUTH ONCE DAILY 90 tablet 1  .  Dihydroxyaluminum Sod Carb (ROLAIDS PO) Take by mouth as needed.    Marland Kitchen glimepiride (AMARYL) 4 MG tablet TAKE ONE TABLET BY MOUTH ONCE DAILY BEFORE BREAKFAST 90 tablet 3  . losartan-hydrochlorothiazide (HYZAAR) 100-12.5 MG tablet Take 1 tablet by mouth daily. 90 tablet 3  . metFORMIN (GLUCOPHAGE) 1000 MG tablet TAKE 1 TABLET BY MOUTH TWICE DAILY WITH FOOD 180 tablet 1  . vitamin B-12 (CYANOCOBALAMIN) 1000 MCG tablet Take 1,000 mcg by mouth daily.     No current facility-administered medications on file prior to visit.     BP (!) 128/58 (BP Location: Left Arm, Patient Position: Sitting, Cuff Size: Normal)   Pulse 75   Temp 97.8 F (36.6 C) (Oral)   Ht 5\' 8"  (1.727 m)   Wt 165 lb 9.6 oz (75.1 kg)   SpO2 98%   BMI 25.18 kg/m     Review of Systems  Constitutional: Negative for appetite change, chills, fatigue and fever.  HENT: Negative for congestion, dental problem, ear pain, hearing loss, sore throat, tinnitus, trouble swallowing and voice change.   Eyes: Negative for pain, discharge and visual disturbance.  Respiratory: Negative for cough, chest tightness, wheezing and stridor.   Cardiovascular: Negative for chest pain, palpitations and leg swelling.  Gastrointestinal: Negative for abdominal distention, abdominal pain,  blood in stool, constipation, diarrhea, nausea and vomiting.  Genitourinary: Negative for difficulty urinating, discharge, flank pain, genital sores, hematuria and urgency.  Musculoskeletal: Negative for arthralgias, back pain, gait problem, joint swelling, myalgias and neck stiffness.  Skin: Negative for rash.  Neurological: Negative for dizziness, syncope, speech difficulty, weakness, numbness and headaches.  Hematological: Negative for adenopathy. Does not bruise/bleed easily.  Psychiatric/Behavioral: Negative for behavioral problems and dysphoric mood. The patient is not nervous/anxious.        Objective:   Physical Exam  Constitutional: He is oriented to  person, place, and time. He appears well-developed.  HENT:  Head: Normocephalic.  Right Ear: External ear normal.  Left Ear: External ear normal.  Eyes: Conjunctivae and EOM are normal.  Neck: Normal range of motion.  Cardiovascular: Normal rate and normal heart sounds.   Pulmonary/Chest: Breath sounds normal.  Abdominal: Bowel sounds are normal.  Musculoskeletal: Normal range of motion. He exhibits no edema or tenderness.  Neurological: He is alert and oriented to person, place, and time.  Psychiatric: He has a normal mood and affect. His behavior is normal.          Assessment & Plan:   Diabetes mellitus.  Excellent control.  Will check a hemoglobin A1c at the reference laboratory to confirm present.  Hemoglobin A1c of 5.3 Hypertension, stable Coronary artery disease.  Remains asymptomatic  CPX 6 months  Walter [name]

## 2017-08-04 NOTE — Patient Instructions (Signed)
Limit your sodium (Salt) intake    It is important that you exercise regularly, at least 20 minutes 3 to 4 times per week.  If you develop chest pain or shortness of breath seek  medical attention.   Please check your hemoglobin A1c every 3-6  Months  Annual exam 6 months

## 2017-08-12 ENCOUNTER — Other Ambulatory Visit: Payer: Self-pay | Admitting: Internal Medicine

## 2017-10-11 DIAGNOSIS — D1801 Hemangioma of skin and subcutaneous tissue: Secondary | ICD-10-CM | POA: Diagnosis not present

## 2017-10-11 DIAGNOSIS — Z85828 Personal history of other malignant neoplasm of skin: Secondary | ICD-10-CM | POA: Diagnosis not present

## 2017-10-11 DIAGNOSIS — L570 Actinic keratosis: Secondary | ICD-10-CM | POA: Diagnosis not present

## 2017-10-11 DIAGNOSIS — L821 Other seborrheic keratosis: Secondary | ICD-10-CM | POA: Diagnosis not present

## 2017-10-13 ENCOUNTER — Encounter: Payer: PPO | Admitting: Internal Medicine

## 2017-12-10 ENCOUNTER — Other Ambulatory Visit: Payer: Self-pay | Admitting: Internal Medicine

## 2018-01-22 ENCOUNTER — Other Ambulatory Visit: Payer: Self-pay | Admitting: Internal Medicine

## 2018-01-23 DIAGNOSIS — R69 Illness, unspecified: Secondary | ICD-10-CM | POA: Diagnosis not present

## 2018-02-01 ENCOUNTER — Telehealth: Payer: Self-pay | Admitting: Internal Medicine

## 2018-02-01 NOTE — Telephone Encounter (Signed)
Okay to change to 2 separate medications

## 2018-02-01 NOTE — Telephone Encounter (Signed)
Copied from [place]. Topic: General - Other >> Feb 01, 2018  1:47 PM Darl Householder, RMA wrote: Reason for CRM: [facility] is requesting that medication losartan-hydrochlorothiazide (HYZAAR) 100-12.5 MG tablet be broken into two separate medications due to this medication is on a recall

## 2018-02-02 ENCOUNTER — Ambulatory Visit (INDEPENDENT_AMBULATORY_CARE_PROVIDER_SITE_OTHER): Payer: Medicare HMO | Admitting: Internal Medicine

## 2018-02-02 ENCOUNTER — Encounter: Payer: Self-pay | Admitting: Internal Medicine

## 2018-02-02 VITALS — BP 100/60 | HR 63 | Temp 97.8°F | Ht 69.0 in | Wt 164.0 lb

## 2018-02-02 DIAGNOSIS — I10 Essential (primary) hypertension: Secondary | ICD-10-CM | POA: Diagnosis not present

## 2018-02-02 DIAGNOSIS — E1151 Type 2 diabetes mellitus with diabetic peripheral angiopathy without gangrene: Secondary | ICD-10-CM

## 2018-02-02 DIAGNOSIS — E785 Hyperlipidemia, unspecified: Secondary | ICD-10-CM | POA: Diagnosis not present

## 2018-02-02 DIAGNOSIS — Z0000 Encounter for general adult medical examination without abnormal findings: Secondary | ICD-10-CM | POA: Diagnosis not present

## 2018-02-02 DIAGNOSIS — E538 Deficiency of other specified B group vitamins: Secondary | ICD-10-CM

## 2018-02-02 LAB — CBC WITH DIFFERENTIAL/PLATELET
Basophils Absolute: 0 10*3/uL (ref 0.0–0.1)
Basophils Relative: 0.4 % (ref 0.0–3.0)
Eosinophils Absolute: 0.2 10*3/uL (ref 0.0–0.7)
Eosinophils Relative: 2.5 % (ref 0.0–5.0)
HEMATOCRIT: 36.5 % — AB (ref 39.0–52.0)
HEMOGLOBIN: 12.2 g/dL — AB (ref 13.0–17.0)
LYMPHS PCT: 18.1 % (ref 12.0–46.0)
Lymphs Abs: 1.1 10*3/uL (ref 0.7–4.0)
MCHC: 33.4 g/dL (ref 30.0–36.0)
MCV: 86.7 fl (ref 78.0–100.0)
MONO ABS: 0.3 10*3/uL (ref 0.1–1.0)
Monocytes Relative: 5.7 % (ref 3.0–12.0)
Neutro Abs: 4.5 10*3/uL (ref 1.4–7.7)
Neutrophils Relative %: 73.3 % (ref 43.0–77.0)
Platelets: 235 10*3/uL (ref 150.0–400.0)
RBC: 4.21 Mil/uL — AB (ref 4.22–5.81)
RDW: 14 % (ref 11.5–15.5)
WBC: 6.1 10*3/uL (ref 4.0–10.5)

## 2018-02-02 LAB — COMPREHENSIVE METABOLIC PANEL
ALBUMIN: 4.1 g/dL (ref 3.5–5.2)
ALT: 28 U/L (ref 0–53)
AST: 22 U/L (ref 0–37)
Alkaline Phosphatase: 79 U/L (ref 39–117)
BUN: 18 mg/dL (ref 6–23)
CALCIUM: 9.6 mg/dL (ref 8.4–10.5)
CO2: 28 meq/L (ref 19–32)
CREATININE: 1.26 mg/dL (ref 0.40–1.50)
Chloride: 102 mEq/L (ref 96–112)
GFR: 59.67 mL/min — AB (ref >60.00)
Glucose, Bld: 71 mg/dL (ref 70–99)
POTASSIUM: 4.2 meq/L (ref 3.5–5.1)
Sodium: 138 mEq/L (ref 135–145)
Total Bilirubin: 0.7 mg/dL (ref 0.2–1.2)
Total Protein: 6.9 g/dL (ref 6.0–8.3)

## 2018-02-02 LAB — LIPID PANEL
CHOL/HDL RATIO: 3
CHOLESTEROL: 142 mg/dL (ref 0–200)
HDL: 44.7 mg/dL (ref >39.00)
LDL CALC: 70 mg/dL (ref 0–99)
NonHDL: 96.88
Triglycerides: 134 mg/dL (ref 0.0–149.0)
VLDL: 26.8 mg/dL (ref 0.0–40.0)

## 2018-02-02 LAB — HEMOGLOBIN A1C: HEMOGLOBIN A1C: 6.3 % (ref 4.6–6.5)

## 2018-02-02 LAB — VITAMIN B12: Vitamin B-12: 244 pg/mL (ref 211–911)

## 2018-02-02 LAB — MICROALBUMIN / CREATININE URINE RATIO
CREATININE, U: 75.4 mg/dL
Microalb Creat Ratio: 0.9 mg/g (ref 0.0–30.0)

## 2018-02-02 LAB — TSH: TSH: 0.73 u[IU]/mL (ref 0.35–4.50)

## 2018-02-02 MED ORDER — CIMETIDINE 400 MG PO TABS: 400.0000 mg | ORAL_TABLET | Freq: Every day | ORAL | 4 refills | Status: DC

## 2018-02-02 MED ORDER — LOSARTAN POTASSIUM 100 MG PO TABS: 100.0000 mg | ORAL_TABLET | Freq: Every day | ORAL | 3 refills | Status: DC

## 2018-02-02 MED ORDER — HYDROCHLOROTHIAZIDE 12.5 MG PO TABS: 12.5000 mg | ORAL_TABLET | Freq: Every day | ORAL | 3 refills | Status: DC

## 2018-02-02 NOTE — Telephone Encounter (Signed)
Spoke to pharmacist and the medication has been taking care of.

## 2018-02-02 NOTE — Patient Instructions (Signed)
Limit your sodium (Salt) intake    It is important that you exercise regularly, at least 20 minutes 3 to 4 times per week.  If you develop chest pain or shortness of breath seek  medical attention.  Please check your blood pressure on a regular basis.  If it is consistently greater than 150/90, please make an office appointment.   Please check your hemoglobin A1c every 3-6  Months   Tagamet 400 mg at bedtime

## 2018-02-02 NOTE — Progress Notes (Signed)
Subjective:    Patient ID: Walter [name], male    DOB: 1946-02-22, 72 y.o.   MRN: [id number]  HPI 72 year old patient who is seen today for a annual preventive health exam as well as a subsequent Medicare wellness visit.  He has a history of essential hypertension and type 2 diabetes.  He has a history of CAD and PAD.  He has required dilatation of a distal esophageal stricture in the past.  Denies any swallowing difficulty.  No longer on PPI therapy but does take Tagamet sporadically.  Denies any cardiopulmonary complaints  Lab Results  Component Value Date   HGBA1C 6.4 08/04/2017   Past Medical History:  Diagnosis Date  . Coronary artery disease   . Diabetes mellitus   . Diverticulosis   . Esophageal stricture   . GERD (gastroesophageal reflux disease)   . Hypertension   . Peripheral vascular disease ([place])      Social History   Socioeconomic History  . Marital status: Married    Spouse name: Not on file  . Number of children: Not on file  . Years of education: Not on file  . Highest education level: Not on file  Occupational History  . Not on file  Social Needs  . Financial resource strain: Not on file  . Food insecurity:    Worry: Not on file    Inability: Not on file  . Transportation needs:    Medical: Not on file    Non-medical: Not on file  Tobacco Use  . Smoking status: Former Research scientist (life sciences)  . Smokeless tobacco: Never Used  Substance and Sexual Activity  . Alcohol use: No  . Drug use: No  . Sexual activity: Not on file  Lifestyle  . Physical activity:    Days per week: Not on file    Minutes per session: Not on file  . Stress: Not on file  Relationships  . Social connections:    Talks on phone: Not on file    Gets together: Not on file    Attends religious service: Not on file    Active member of club or organization: Not on file    Attends meetings of clubs or organizations: Not on file    Relationship status: Not on file  . Intimate partner  violence:    Fear of current or ex partner: Not on file    Emotionally abused: Not on file    Physically abused: Not on file    Forced sexual activity: Not on file  Other Topics Concern  . Not on file  Social History Narrative   Daily caffeine     Past Surgical History:  Procedure Laterality Date  . CORONARY ANGIOPLASTY WITH STENT PLACEMENT    . ESOPHAGOGASTRODUODENOSCOPY  06/07/2012   Procedure: ESOPHAGOGASTRODUODENOSCOPY (EGD);  Surgeon: Lafayette Dragon, MD;  Location: [facility];  Service: Endoscopy;  Laterality: N/A;  . SHOULDER SURGERY  2006    Family History  Problem Relation Age of Onset  . Breast cancer Mother   . Colon cancer Neg Hx     No Known Allergies  Current Outpatient Medications on File Prior to Visit  Medication Sig Dispense Refill  . amLODipine (NORVASC) 5 MG tablet TAKE 1 TABLET BY MOUTH ONCE DAILY 90 tablet 2  . aspirin 81 MG tablet Take 81 mg by mouth daily.      Marland Kitchen atorvastatin (LIPITOR) 40 MG tablet TAKE 1 TABLET BY MOUTH ONCE DAILY 90 tablet 1  . Dihydroxyaluminum Sod  Carb (ROLAIDS PO) Take by mouth as needed.    Marland Kitchen glimepiride (AMARYL) 4 MG tablet TAKE ONE TABLET BY MOUTH ONCE DAILY BEFORE BREAKFAST 90 tablet 3  . metFORMIN (GLUCOPHAGE) 1000 MG tablet TAKE 1 TABLET BY MOUTH TWICE DAILY WITH FOOD 180 tablet 1  . vitamin B-12 (CYANOCOBALAMIN) 1000 MCG tablet Take 1,000 mcg by mouth daily.    Marland Kitchen losartan-hydrochlorothiazide (HYZAAR) 100-12.5 MG tablet Take 1 tablet by mouth daily. (Patient not taking: Reported on 02/02/2018) 90 tablet 3   No current facility-administered medications on file prior to visit.     BP 100/60 (BP Location: Right Arm, Patient Position: Sitting, Cuff Size: Large)   Pulse 63   Temp 97.8 F (36.6 C) (Oral)   Ht 5\' 9"  (1.753 m)   Wt 164 lb (74.4 kg)   SpO2 97%   BMI 24.22 kg/m   Subsequent Medicare wellness visit  1. Risk factors, based on past  M,S,F history.  Patient has a history of known coronary artery disease as well as  some PAD.  Asymptomatic.  No exertional chest pain or claudication.  Cardiovascular risk factors include history of diabetes hypertension and dyslipidemia.  He also has a remote history of tobacco use  2.  Physical activities: No activity restrictions;  does bike 4-5 times per week  3.  Depression/mood: No history of major depression or mood disorder  4.  Hearing: Mild to moderate deficits  5.  ADL's: Completely independent  6.  Fall risk: Low  7.  Home safety: No no problems identified  8.  Height weight, and visual acuity; height and weight stable no change in visual acuity.  Is followed by ophthalmology annually  9.  Counseling: Continue regular exercise and heart healthy diet  10. Lab orders based on risk factors: Laboratory update will be reviewed including hepatitis C antibody lipid profile urine for microalbumin and hemoglobin A1c  11. Referral : None appropriate at this time  12. Care plan: Continue efforts at aggressive risk factor modification  13. Cognitive assessment:  Alert and appropriate with normal affect.  No cognitive dysfunction 14. Screening: Patient provided with a written and personalized 5-10 year screening schedule in the AVS.    15. Provider List Update: Primary care ophthalmology    Review of Systems  Constitutional: Negative for appetite change, chills, fatigue and fever.  HENT: Negative for congestion, dental problem, ear pain, hearing loss, sore throat, tinnitus, trouble swallowing and voice change.   Eyes: Negative for pain, discharge and visual disturbance.  Respiratory: Negative for cough, chest tightness, wheezing and stridor.   Cardiovascular: Negative for chest pain, palpitations and leg swelling.  Gastrointestinal: Negative for abdominal distention, abdominal pain, blood in stool, constipation, diarrhea, nausea and vomiting.  Genitourinary: Negative for difficulty urinating, discharge, flank pain, genital sores, hematuria and urgency.    Musculoskeletal: Negative for arthralgias, back pain, gait problem, joint swelling, myalgias and neck stiffness.  Skin: Negative for rash.  Neurological: Negative for dizziness, syncope, speech difficulty, weakness, numbness and headaches.  Hematological: Negative for adenopathy. Does not bruise/bleed easily.  Psychiatric/Behavioral: Negative for behavioral problems and dysphoric mood. The patient is not nervous/anxious.        Objective:   Physical Exam  Constitutional: He appears well-developed and well-nourished.  HENT:  Head: Normocephalic and atraumatic.  Right Ear: External ear normal.  Left Ear: External ear normal.  Nose: Nose normal.  Mouth/Throat: Oropharynx is clear and moist.  Eyes: Pupils are equal, round, and reactive to light. Conjunctivae and  EOM are normal. No scleral icterus.  Neck: Normal range of motion. Neck supple. No JVD present. No thyromegaly present.  Cardiovascular: Regular rhythm, normal heart sounds and intact distal pulses. Exam reveals no gallop and no friction rub.  No murmur heard. Patient has history of PAD.  Pedal pulses are intact  Pulmonary/Chest: Effort normal and breath sounds normal. He exhibits no tenderness.  Abdominal: Soft. Bowel sounds are normal. He exhibits no distension and no mass. There is no tenderness.  Genitourinary: Penis normal. Rectal exam shows guaiac negative stool.  Musculoskeletal: Normal range of motion. He exhibits no edema or tenderness.  Lymphadenopathy:    He has no cervical adenopathy.  Neurological: He is alert. He has normal reflexes. No cranial nerve deficit. Coordination normal.  Skin: Skin is warm and dry. No rash noted.  Psychiatric: He has a normal mood and affect. His behavior is normal.          Assessment & Plan:   Preventive health examination Subsequent Medicare wellness visit Diabetes mellitus.  Will review hemoglobin A1c and urine for microalbumin Essential hypertension stable.  No change in  therapy Coronary artery disease stable History of GERD with distal esophageal stricture.  Patient was asked to use H2 blocker therapy nightly History of B12 deficiency.  Will review a B12 level.  Continue oral supplementation   Nyoka Cowden

## 2018-02-03 LAB — HEPATITIS C ANTIBODY
Hepatitis C Ab: NONREACTIVE
SIGNAL TO CUT-OFF: 0.02 (ref <1.00)

## 2018-02-13 ENCOUNTER — Other Ambulatory Visit: Payer: Self-pay | Admitting: Internal Medicine

## 2018-02-13 DIAGNOSIS — J309 Allergic rhinitis, unspecified: Secondary | ICD-10-CM | POA: Diagnosis not present

## 2018-02-13 DIAGNOSIS — I10 Essential (primary) hypertension: Secondary | ICD-10-CM | POA: Diagnosis not present

## 2018-02-13 DIAGNOSIS — I2510 Atherosclerotic heart disease of native coronary artery without angina pectoris: Secondary | ICD-10-CM | POA: Diagnosis not present

## 2018-02-13 DIAGNOSIS — E1159 Type 2 diabetes mellitus with other circulatory complications: Secondary | ICD-10-CM | POA: Diagnosis not present

## 2018-02-13 DIAGNOSIS — E785 Hyperlipidemia, unspecified: Secondary | ICD-10-CM | POA: Diagnosis not present

## 2018-02-13 DIAGNOSIS — E538 Deficiency of other specified B group vitamins: Secondary | ICD-10-CM | POA: Diagnosis not present

## 2018-02-13 DIAGNOSIS — G8929 Other chronic pain: Secondary | ICD-10-CM | POA: Diagnosis not present

## 2018-02-13 DIAGNOSIS — K219 Gastro-esophageal reflux disease without esophagitis: Secondary | ICD-10-CM | POA: Diagnosis not present

## 2018-02-13 DIAGNOSIS — Z7982 Long term (current) use of aspirin: Secondary | ICD-10-CM | POA: Diagnosis not present

## 2018-02-13 DIAGNOSIS — Z7984 Long term (current) use of oral hypoglycemic drugs: Secondary | ICD-10-CM | POA: Diagnosis not present

## 2018-06-06 ENCOUNTER — Other Ambulatory Visit: Payer: Self-pay | Admitting: Internal Medicine

## 2018-06-30 DIAGNOSIS — H31001 Unspecified chorioretinal scars, right eye: Secondary | ICD-10-CM | POA: Diagnosis not present

## 2018-06-30 DIAGNOSIS — H02052 Trichiasis without entropian right lower eyelid: Secondary | ICD-10-CM | POA: Diagnosis not present

## 2018-06-30 DIAGNOSIS — H524 Presbyopia: Secondary | ICD-10-CM | POA: Diagnosis not present

## 2018-07-06 ENCOUNTER — Encounter: Payer: Self-pay | Admitting: Internal Medicine

## 2018-07-06 ENCOUNTER — Ambulatory Visit (INDEPENDENT_AMBULATORY_CARE_PROVIDER_SITE_OTHER): Payer: Medicare HMO | Admitting: Internal Medicine

## 2018-07-06 VITALS — BP 100/62 | HR 66 | Temp 97.8°F | Wt 163.0 lb

## 2018-07-06 DIAGNOSIS — I2510 Atherosclerotic heart disease of native coronary artery without angina pectoris: Secondary | ICD-10-CM

## 2018-07-06 DIAGNOSIS — E1151 Type 2 diabetes mellitus with diabetic peripheral angiopathy without gangrene: Secondary | ICD-10-CM

## 2018-07-06 DIAGNOSIS — I10 Essential (primary) hypertension: Secondary | ICD-10-CM | POA: Diagnosis not present

## 2018-07-06 LAB — POCT GLYCOSYLATED HEMOGLOBIN (HGB A1C): Hemoglobin A1C: 5.8 % — AB (ref 4.0–5.6)

## 2018-07-06 MED ORDER — GLIMEPIRIDE 4 MG PO TABS: 2.0000 mg | ORAL_TABLET | Freq: Every day | ORAL | 3 refills | Status: DC

## 2018-07-06 NOTE — Progress Notes (Signed)
Subjective:    Patient ID: Walter [name], male    DOB: 02/05/1946, 72 y.o.   MRN: [id number]  HPI  72 year old patient who is seen today for follow-up of diabetes and hypertension.  He also has a history of CAD which has been stable.  He remains on statin therapy.  Doing quite well without concerns or complaints.  He does monitor home blood pressure readings with very nice control  Lab Results  Component Value Date   HGBA1C 5.8 (A) 07/06/2018    Blood pressure today 100/56  Past Medical History:  Diagnosis Date  . Coronary artery disease   . Diabetes mellitus   . Diverticulosis   . Esophageal stricture   . GERD (gastroesophageal reflux disease)   . Hypertension   . Peripheral vascular disease ([place])      Social History   Socioeconomic History  . Marital status: Married    Spouse name: Not on file  . Number of children: Not on file  . Years of education: Not on file  . Highest education level: Not on file  Occupational History  . Not on file  Social Needs  . Financial resource strain: Not on file  . Food insecurity:    Worry: Not on file    Inability: Not on file  . Transportation needs:    Medical: Not on file    Non-medical: Not on file  Tobacco Use  . Smoking status: Former Research scientist (life sciences)  . Smokeless tobacco: Never Used  Substance and Sexual Activity  . Alcohol use: No  . Drug use: No  . Sexual activity: Not on file  Lifestyle  . Physical activity:    Days per week: Not on file    Minutes per session: Not on file  . Stress: Not on file  Relationships  . Social connections:    Talks on phone: Not on file    Gets together: Not on file    Attends religious service: Not on file    Active member of club or organization: Not on file    Attends meetings of clubs or organizations: Not on file    Relationship status: Not on file  . Intimate partner violence:    Fear of current or ex partner: Not on file    Emotionally abused: Not on file    Physically abused:  Not on file    Forced sexual activity: Not on file  Other Topics Concern  . Not on file  Social History Narrative   Daily caffeine     Past Surgical History:  Procedure Laterality Date  . CORONARY ANGIOPLASTY WITH STENT PLACEMENT    . ESOPHAGOGASTRODUODENOSCOPY  06/07/2012   Procedure: ESOPHAGOGASTRODUODENOSCOPY (EGD);  Surgeon: Lafayette Dragon, MD;  Location: [facility];  Service: Endoscopy;  Laterality: N/A;  . SHOULDER SURGERY  2006    Family History  Problem Relation Age of Onset  . Breast cancer Mother   . Colon cancer Neg Hx     No Known Allergies  Current Outpatient Medications on File Prior to Visit  Medication Sig Dispense Refill  . amLODipine (NORVASC) 5 MG tablet TAKE 1 TABLET BY MOUTH ONCE DAILY 90 tablet 2  . aspirin 81 MG tablet Take 81 mg by mouth daily.      Marland Kitchen atorvastatin (LIPITOR) 40 MG tablet TAKE 1 TABLET BY MOUTH ONCE DAILY 90 tablet 1  . cimetidine (TAGAMET) 400 MG tablet Take 1 tablet (400 mg total) by mouth at bedtime. 90 tablet 4  .  Dihydroxyaluminum Sod Carb (ROLAIDS PO) Take by mouth as needed.    Marland Kitchen losartan (COZAAR) 100 MG tablet Take 1 tablet (100 mg total) by mouth daily. 90 tablet 3  . metFORMIN (GLUCOPHAGE) 1000 MG tablet TAKE 1 TABLET BY MOUTH TWICE DAILY WITH FOOD 180 tablet 1  . vitamin B-12 (CYANOCOBALAMIN) 1000 MCG tablet Take 1,000 mcg by mouth daily.     No current facility-administered medications on file prior to visit.     BP 100/62 (BP Location: Right Arm, Patient Position: Sitting, Cuff Size: Normal)   Pulse 66   Temp 97.8 F (36.6 C) (Oral)   Wt 163 lb (73.9 kg)   SpO2 99%   BMI 24.07 kg/m     Review of Systems  Constitutional: Negative for appetite change, chills, fatigue and fever.  HENT: Negative for congestion, dental problem, ear pain, hearing loss, sore throat, tinnitus, trouble swallowing and voice change.   Eyes: Negative for pain, discharge and visual disturbance.  Respiratory: Negative for cough, chest  tightness, wheezing and stridor.   Cardiovascular: Negative for chest pain, palpitations and leg swelling.  Gastrointestinal: Negative for abdominal distention, abdominal pain, blood in stool, constipation, diarrhea, nausea and vomiting.  Genitourinary: Negative for difficulty urinating, discharge, flank pain, genital sores, hematuria and urgency.  Musculoskeletal: Negative for arthralgias, back pain, gait problem, joint swelling, myalgias and neck stiffness.  Skin: Negative for rash.  Neurological: Negative for dizziness, syncope, speech difficulty, weakness, numbness and headaches.  Hematological: Negative for adenopathy. Does not bruise/bleed easily.  Psychiatric/Behavioral: Negative for behavioral problems and dysphoric mood. The patient is not nervous/anxious.        Objective:   Physical Exam  Constitutional: He is oriented to person, place, and time. He appears well-developed.  Blood pressure 100/56  HENT:  Head: Normocephalic.  Right Ear: External ear normal.  Left Ear: External ear normal.  Eyes: Conjunctivae and EOM are normal.  Neck: Normal range of motion.  Cardiovascular: Normal rate and normal heart sounds.  Pulmonary/Chest: Breath sounds normal.  Abdominal: Bowel sounds are normal.  Musculoskeletal: Normal range of motion. He exhibits no edema or tenderness.  Neurological: He is alert and oriented to person, place, and time.  Psychiatric: He has a normal mood and affect. His behavior is normal.          Assessment & Plan:  Essential hypertension.  Blood pressure low normal today.  Will discontinue hydrochlorothiazide and continue amlodipine and losartan  Diabetes mellitus.  Will decrease glimepiride to 2 mg daily.  Continue metformin.  Consider discontinuation of SU therapy next visit  Coronary artery disease stable continue aspirin and statin therapy  Follow-up 3 to 6 months  Marletta [name]

## 2018-07-06 NOTE — Patient Instructions (Signed)
Discontinue hydrochlorothiazide  Decrease glimepiride to 2 mg  (one half of 4 mg tablet) once daily in the morning   Please check your hemoglobin A1c every 3-6 months  Return in 6 months for follow-up

## 2018-07-20 DIAGNOSIS — R69 Illness, unspecified: Secondary | ICD-10-CM | POA: Diagnosis not present

## 2018-07-22 ENCOUNTER — Other Ambulatory Visit: Payer: Self-pay | Admitting: Internal Medicine

## 2018-07-25 ENCOUNTER — Encounter: Payer: Self-pay | Admitting: Physician Assistant

## 2018-07-25 ENCOUNTER — Other Ambulatory Visit: Payer: Self-pay

## 2018-07-25 ENCOUNTER — Ambulatory Visit (INDEPENDENT_AMBULATORY_CARE_PROVIDER_SITE_OTHER): Payer: Medicare HMO | Admitting: Physician Assistant

## 2018-07-25 DIAGNOSIS — E1151 Type 2 diabetes mellitus with diabetic peripheral angiopathy without gangrene: Secondary | ICD-10-CM

## 2018-07-25 DIAGNOSIS — I10 Essential (primary) hypertension: Secondary | ICD-10-CM | POA: Diagnosis not present

## 2018-07-25 NOTE — Progress Notes (Signed)
Patient presents to clinic today to transfer care from Dr. Raliegh Ip at our [place] office who is retiring.   Hypertension -- Patient is currently on a regimen of Norvasc 5 mg and Losartan 100 mg daily. Was on HCTZ but this was recently stopped by previous PCP. Patient denies chest pain, palpitations, lightheadedness, dizziness, vision changes or frequent headaches.  BP Readings from Last 3 Encounters:  07/25/18 124/62  07/06/18 100/62  02/02/18 100/60   Diabetes Mellitus -- Currently on a regimen of Metofrmin 1000 mg BID and Amaryl 2 mg daily. The amaryl was recently lowered as patient's A1C was <6. Is up-to-date on eye examination and immunizations. Is due for foot exam. Denies complaints today. Endorses his fasting glucose averaging 97-105.  Past Medical History:  Diagnosis Date  . Coronary artery disease   . Diabetes mellitus   . Diverticulosis   . Esophageal stricture   . GERD (gastroesophageal reflux disease)   . Hypertension   . Peripheral vascular disease ([place])     Current Outpatient Medications on File Prior to Visit  Medication Sig Dispense Refill  . amLODipine (NORVASC) 5 MG tablet TAKE 1 TABLET BY MOUTH ONCE DAILY 90 tablet 2  . aspirin 81 MG tablet Take 81 mg by mouth daily.      Marland Kitchen atorvastatin (LIPITOR) 40 MG tablet TAKE 1 TABLET BY MOUTH ONCE DAILY 90 tablet 1  . cimetidine (TAGAMET) 400 MG tablet Take 1 tablet (400 mg total) by mouth at bedtime. 90 tablet 4  . Dihydroxyaluminum Sod Carb (ROLAIDS PO) Take by mouth as needed.    Marland Kitchen glimepiride (AMARYL) 4 MG tablet Take 0.5 tablets (2 mg total) by mouth daily with breakfast. 90 tablet 3  . losartan (COZAAR) 100 MG tablet Take 1 tablet (100 mg total) by mouth daily. 90 tablet 3  . metFORMIN (GLUCOPHAGE) 1000 MG tablet TAKE 1 TABLET BY MOUTH TWICE DAILY WITH FOOD 180 tablet 1  . vitamin B-12 (CYANOCOBALAMIN) 1000 MCG tablet Take 1,000 mcg by mouth daily.     No current facility-administered medications on file prior to  visit.     No Known Allergies  Family History  Problem Relation Age of Onset  . Breast cancer Mother   . Healthy Son   . Healthy Son   . Colon cancer Neg Hx     Social History   Socioeconomic History  . Marital status: Married    Spouse name: Not on file  . Number of children: 3  . Years of education: Not on file  . Highest education level: Not on file  Occupational History  . Occupation: Holiday representative  . Financial resource strain: Not on file  . Food insecurity:    Worry: Not on file    Inability: Not on file  . Transportation needs:    Medical: Not on file    Non-medical: Not on file  Tobacco Use  . Smoking status: Former Research scientist (life sciences)  . Smokeless tobacco: Never Used  Substance and Sexual Activity  . Alcohol use: No  . Drug use: No  . Sexual activity: Not on file  Lifestyle  . Physical activity:    Days per week: Not on file    Minutes per session: Not on file  . Stress: Not on file  Relationships  . Social connections:    Talks on phone: Not on file    Gets together: Not on file    Attends religious service: Not on file    Active member  of club or organization: Not on file    Attends meetings of clubs or organizations: Not on file    Relationship status: Not on file  Other Topics Concern  . Not on file  Social History Narrative   Daily caffeine    Review of Systems - See HPI.  All other ROS are negative.  BP 124/62   Pulse 80   Temp (!) 97.5 F (36.4 C) (Oral)   Resp 14   Ht 5\' 9"  (1.753 m)   Wt 162 lb (73.5 kg)   SpO2 98%   BMI 23.92 kg/m   Physical Exam  Constitutional: He is oriented to person, place, and time. He appears well-developed and well-nourished.  HENT:  Head: Normocephalic and atraumatic.  Eyes: Pupils are equal, round, and reactive to light. Conjunctivae are normal.  Neck: Neck supple.  Cardiovascular: Normal rate, regular rhythm, normal heart sounds and intact distal pulses.  Pulmonary/Chest: Effort normal and breath  sounds normal. No stridor. No respiratory distress. He has no wheezes. He has no rales. He exhibits no tenderness.  Lymphadenopathy:    He has no cervical adenopathy.  Neurological: He is alert and oriented to person, place, and time.  Psychiatric: He has a normal mood and affect.  Vitals reviewed.   Diabetic Foot Form - Detailed   Diabetic Foot Exam - detailed Diabetic Foot exam was performed with the following findings:  Yes 07/25/2018  1:26 PM  Visual Foot Exam completed.:  Yes  Can the patient see the bottom of their feet?:  Yes Are the shoes appropriate in style and fit?:  Yes Is there swelling or and abnormal foot shape?:  No Is there a claw toe deformity?:  No Is there elevated skin temparature?:  No Is there foot or ankle muscle weakness?:  No Normal Range of Motion:  Yes Pulse Foot Exam completed.:  Yes  Right posterior Tibialias:  Present Left posterior Tibialias:  Present  Right Dorsalis Pedis:  Present Left Dorsalis Pedis:  Present  Sensory Foot Exam Completed.:  Yes Semmes-Weinstein Monofilament Test R Site 1-Great Toe:  Pos L Site 1-Great Toe:  Pos        Recent Results (from the past 2160 hour(s))  POCT glycosylated hemoglobin (Hb A1C)     Status: Abnormal   Collection Time: 07/06/18 10:09 AM  Result Value Ref Range   Hemoglobin A1C 5.8 (A) 4.0 - 5.6 %   HbA1c POC (<> result, manual entry)     HbA1c, POC (prediabetic range)     HbA1c, POC (controlled diabetic range)      Assessment/Plan: Diabetes mellitus with peripheral circulatory disorder Well-controlled. Asymptomatic. Foot exam updated today. Continue current regimen for now. Will see in 4 weeks and review glucose measurements. Will discontinue Amaryl if glucose remains in current range.   Essential hypertension Stable. Continue current regimen.     Leeanne Rio, PA-C

## 2018-07-25 NOTE — Patient Instructions (Addendum)
Please keep well-hydrated and keep a well-balanced diet.  Continue current medication regimen.  Follow-up with me in 4 weeks for reassessment of glucose levels so we can see about stopping the Amaryl.  It was very nice to meet you!!

## 2018-07-26 NOTE — Assessment & Plan Note (Signed)
Well-controlled. Asymptomatic. Foot exam updated today. Continue current regimen for now. Will see in 4 weeks and review glucose measurements. Will discontinue Amaryl if glucose remains in current range.

## 2018-07-26 NOTE — Assessment & Plan Note (Signed)
Stable  Continue current regimen  

## 2018-07-31 DIAGNOSIS — R69 Illness, unspecified: Secondary | ICD-10-CM | POA: Diagnosis not present

## 2018-08-12 ENCOUNTER — Other Ambulatory Visit: Payer: Self-pay | Admitting: Internal Medicine

## 2018-08-15 ENCOUNTER — Other Ambulatory Visit: Payer: Self-pay | Admitting: Internal Medicine

## 2018-08-16 ENCOUNTER — Other Ambulatory Visit: Payer: Self-pay

## 2018-08-17 MED ORDER — ATORVASTATIN CALCIUM 40 MG PO TABS: 40.0000 mg | ORAL_TABLET | Freq: Every day | ORAL | 1 refills | Status: DC

## 2018-08-17 NOTE — Addendum Note (Signed)
Addended by: Katina Dung on: 08/17/2018 01:26 PM   Modules accepted: Orders

## 2018-08-17 NOTE — Telephone Encounter (Signed)
Wrong office. Dr Hassell Done is PCP now. atorvastatin (LIPITOR) 40 MG tablet  Needs refill

## 2018-08-28 ENCOUNTER — Ambulatory Visit (INDEPENDENT_AMBULATORY_CARE_PROVIDER_SITE_OTHER): Payer: Medicare HMO | Admitting: Physician Assistant

## 2018-08-28 ENCOUNTER — Encounter: Payer: Self-pay | Admitting: Physician Assistant

## 2018-08-28 ENCOUNTER — Other Ambulatory Visit: Payer: Self-pay

## 2018-08-28 VITALS — BP 112/58 | HR 68 | Temp 98.3°F | Resp 14 | Ht 69.0 in | Wt 160.0 lb

## 2018-08-28 DIAGNOSIS — E1151 Type 2 diabetes mellitus with diabetic peripheral angiopathy without gangrene: Secondary | ICD-10-CM

## 2018-08-28 DIAGNOSIS — E538 Deficiency of other specified B group vitamins: Secondary | ICD-10-CM

## 2018-08-28 DIAGNOSIS — R413 Other amnesia: Secondary | ICD-10-CM

## 2018-08-28 LAB — CBC WITH DIFFERENTIAL/PLATELET
BASOS ABS: 0 10*3/uL (ref 0.0–0.1)
Basophils Relative: 0.3 % (ref 0.0–3.0)
EOS ABS: 0.2 10*3/uL (ref 0.0–0.7)
Eosinophils Relative: 2.3 % (ref 0.0–5.0)
HCT: 36.4 % — ABNORMAL LOW (ref 39.0–52.0)
HEMOGLOBIN: 12 g/dL — AB (ref 13.0–17.0)
Lymphocytes Relative: 15.3 % (ref 12.0–46.0)
Lymphs Abs: 1 10*3/uL (ref 0.7–4.0)
MCHC: 33 g/dL (ref 30.0–36.0)
MCV: 86.7 fl (ref 78.0–100.0)
MONO ABS: 0.5 10*3/uL (ref 0.1–1.0)
Monocytes Relative: 7.1 % (ref 3.0–12.0)
Neutro Abs: 5 10*3/uL (ref 1.4–7.7)
Neutrophils Relative %: 75 % (ref 43.0–77.0)
Platelets: 215 10*3/uL (ref 150.0–400.0)
RBC: 4.19 Mil/uL — AB (ref 4.22–5.81)
RDW: 13.8 % (ref 11.5–15.5)
WBC: 6.6 10*3/uL (ref 4.0–10.5)

## 2018-08-28 LAB — VITAMIN B12: Vitamin B-12: 557 pg/mL (ref 211–911)

## 2018-08-28 LAB — TSH: TSH: 0.86 u[IU]/mL (ref 0.35–4.50)

## 2018-08-28 NOTE — Progress Notes (Signed)
Patient presents to clinic today for follow-up of glucose levels after decrease in Amaryl from 4 mg to 2 mg. Patient endorses taking medications as directed, cutting back on his SU and continuing Metformin at prior dose. Notes fasting glucose still averaging 90-100. Is watching diet.   Wife notes some decrease in short-term memory over the past year. Patient denies nothing this issue. Does have a history of b12 deficiency but has not been an issue in some time since he takes a 1000 mcg SL tablet of b12 daily.   Past Medical History:  Diagnosis Date  . Coronary artery disease   . Diabetes mellitus   . Diverticulosis   . Esophageal stricture   . GERD (gastroesophageal reflux disease)   . Hypertension   . Peripheral vascular disease ([place])     Current Outpatient Medications on File Prior to Visit  Medication Sig Dispense Refill  . amLODipine (NORVASC) 5 MG tablet TAKE 1 TABLET BY MOUTH ONCE DAILY 90 tablet 2  . aspirin 81 MG tablet Take 81 mg by mouth daily.      Marland Kitchen atorvastatin (LIPITOR) 40 MG tablet Take 1 tablet (40 mg total) by mouth daily. 90 tablet 1  . cimetidine (TAGAMET) 400 MG tablet Take 1 tablet (400 mg total) by mouth at bedtime. 90 tablet 4  . Dihydroxyaluminum Sod Carb (ROLAIDS PO) Take by mouth as needed.    Marland Kitchen glimepiride (AMARYL) 4 MG tablet Take 0.5 tablets (2 mg total) by mouth daily with breakfast. 90 tablet 3  . losartan (COZAAR) 100 MG tablet Take 1 tablet (100 mg total) by mouth daily. 90 tablet 3  . metFORMIN (GLUCOPHAGE) 1000 MG tablet TAKE 1 TABLET BY MOUTH TWICE DAILY WITH FOOD 180 tablet 1  . vitamin B-12 (CYANOCOBALAMIN) 1000 MCG tablet Take 1,000 mcg by mouth daily.     No current facility-administered medications on file prior to visit.     No Known Allergies  Family History  Problem Relation Age of Onset  . Breast cancer Mother   . Healthy Son   . Healthy Son   . Colon cancer Neg Hx     Social History   Socioeconomic History  . Marital status:  Married    Spouse name: Not on file  . Number of children: 3  . Years of education: Not on file  . Highest education level: Not on file  Occupational History  . Occupation: Holiday representative  . Financial resource strain: Not on file  . Food insecurity:    Worry: Not on file    Inability: Not on file  . Transportation needs:    Medical: Not on file    Non-medical: Not on file  Tobacco Use  . Smoking status: Former Research scientist (life sciences)  . Smokeless tobacco: Never Used  Substance and Sexual Activity  . Alcohol use: No  . Drug use: No  . Sexual activity: Not on file  Lifestyle  . Physical activity:    Days per week: Not on file    Minutes per session: Not on file  . Stress: Not on file  Relationships  . Social connections:    Talks on phone: Not on file    Gets together: Not on file    Attends religious service: Not on file    Active member of club or organization: Not on file    Attends meetings of clubs or organizations: Not on file    Relationship status: Not on file  Other Topics Concern  .  Not on file  Social History Narrative   Daily caffeine    Review of Systems - See HPI.  All other ROS are negative.  BP (!) 112/58   Pulse 68   Temp 98.3 F (36.8 C) (Oral)   Resp 14   Ht 5\' 9"  (1.753 m)   Wt 160 lb (72.6 kg)   SpO2 98%   BMI 23.63 kg/m   Physical Exam  Constitutional: He is oriented to person, place, and time. He appears well-developed and well-nourished.  HENT:  Head: Normocephalic and atraumatic.  Cardiovascular: Normal rate, regular rhythm and normal heart sounds.  Pulmonary/Chest: Effort normal and breath sounds normal.  Neurological: He is alert and oriented to person, place, and time.  Vitals reviewed.   Recent Results (from the past 2160 hour(s))  POCT glycosylated hemoglobin (Hb A1C)     Status: Abnormal   Collection Time: 07/06/18 10:09 AM  Result Value Ref Range   Hemoglobin A1C 5.8 (A) 4.0 - 5.6 %   HbA1c POC (<> result, manual entry)      HbA1c, POC (prediabetic range)     HbA1c, POC (controlled diabetic range)      Assessment/Plan: 1. B12 deficiency Is taking SL B12 as directed. Giving notation of memory changes, will recheck levels today. - B12  2. Memory changes MMSE performed with a score of 27/30. No evidence of gait disturbance or rigidity on examination. Mild essential tremor noted of hands with writing on MMSE. On further examination this is bilateral. No resting tremor. Not causing patient issue. Will monitor. Check labs today to further assess.  - CBC w/Diff - B12 - TSH  3. Diabetes mellitus with peripheral circulatory disorder (HCC) CBG remain stable. Will continue Metformin 1000 mg BID. Will stop Amaryl and have him closely monitor CBGS over the next week. Will call in 7-10 days to reviewed measurements and to make further adjustments.   Leeanne Rio, PA-C

## 2018-08-28 NOTE — Patient Instructions (Signed)
Please go to the lab today for blood work.  I will call you with your results. We will alter treatment regimen(s) if indicated by your results.   Go back to the 1 tablet twice daily of the metformin. Stop the Glimiperide (Amaryl). Keep checking glucose twice daily and recording.  I will call in 1 week to review these numbers with you.

## 2018-08-29 ENCOUNTER — Other Ambulatory Visit (INDEPENDENT_AMBULATORY_CARE_PROVIDER_SITE_OTHER): Payer: Medicare HMO

## 2018-08-29 DIAGNOSIS — D649 Anemia, unspecified: Secondary | ICD-10-CM

## 2018-08-29 LAB — IBC PANEL
Iron: 65 ug/dL (ref 42–165)
SATURATION RATIOS: 18.7 % — AB (ref 20.0–50.0)
Transferrin: 248 mg/dL (ref 212.0–360.0)

## 2018-09-09 ENCOUNTER — Other Ambulatory Visit: Payer: Self-pay | Admitting: Internal Medicine

## 2018-09-14 ENCOUNTER — Other Ambulatory Visit: Payer: Self-pay

## 2018-09-15 ENCOUNTER — Telehealth: Payer: Self-pay | Admitting: Physician Assistant

## 2018-09-15 NOTE — Telephone Encounter (Signed)
Copied from [place] [id number]. Topic: General - Other >> Sep 15, 2018 12:47 PM Yvette Rack wrote: Reason for CRM: pt calling to give some Blood Glucose readings  Month of October 29th     morning 117    evening 87 30th      morning  119   evening 112 31st       morning 104     evening 118  Month of November 1st  morning  114   evening 129 2nd  morning 108   evening  130 3rd  morning 114   evening 110 4th  morning 129    evening  219   Skip down to a few days  10th   morning 113 morning 86 11th  morning 109  evening 128 12th  morning 120  evening  115 13th   morning  118  evening 112 14th  morning 120  evening 114 Today 15th   morning 130   haven't done  evening yet

## 2018-09-15 NOTE — Telephone Encounter (Signed)
They look great overall. Continue Metformin at current dose. Will remain off of the Amaryl. Follow-up 3 months.

## 2018-09-15 NOTE — Telephone Encounter (Signed)
LMOVM advising patient blood sugars looked good on Metformin. Continue to stay off the Amaryl and schedule an appointment in 3 months with PCP.

## 2018-09-15 NOTE — Telephone Encounter (Signed)
Please review patient blood sugar readings

## 2018-09-18 ENCOUNTER — Other Ambulatory Visit: Payer: Self-pay | Admitting: Physician Assistant

## 2018-09-18 NOTE — Telephone Encounter (Signed)
Copied from [place] [id number]. Topic: Quick Communication - Rx Refill/Question >> Sep 18, 2018  9:05 AM Cecelia Byars, NT wrote: Medication amLODipine (NORVASC) 5 MG tablet  Has the patient contacted their pharmacy? yes (Agent: If no, request that the patient contact the pharmacy for the refill. (Agent: If yes, when and what did the pharmacy advise?  Preferred Pharmacy (with phone number or street name  [facility] [address], [place] [phone] (Phone) [phone] (Fax)    Agent: Please be advised that RX refills may take up to 3 business days. We ask that you follow-up with your pharmacy.

## 2018-09-19 ENCOUNTER — Other Ambulatory Visit: Payer: Self-pay

## 2018-09-19 ENCOUNTER — Other Ambulatory Visit: Payer: Self-pay | Admitting: *Deleted

## 2018-09-19 ENCOUNTER — Telehealth: Payer: Self-pay | Admitting: Physician Assistant

## 2018-09-19 MED ORDER — AMLODIPINE BESYLATE 5 MG PO TABS: 5.0000 mg | ORAL_TABLET | Freq: Every day | ORAL | 2 refills | Status: DC

## 2018-09-19 NOTE — Progress Notes (Signed)
Requested Prescriptions  Pending Prescriptions Disp Refills  . amLODipine (NORVASC) 5 MG tablet 90 tablet 2    Sig: Take 1 tablet (5 mg total) by mouth daily.     There is no refill protocol information for this order

## 2018-09-19 NOTE — Telephone Encounter (Signed)
Filled per triage protocol

## 2018-09-20 NOTE — Telephone Encounter (Signed)
Called patient on his home phone and left message. Also called the cell number and spoke with patient. He will continue the Metformin as directed and remain off the Amaryl. Advised to schedule an appointment with PCP in 3 months. Patient is agreeable.

## 2018-10-11 DIAGNOSIS — L570 Actinic keratosis: Secondary | ICD-10-CM | POA: Diagnosis not present

## 2018-10-11 DIAGNOSIS — D692 Other nonthrombocytopenic purpura: Secondary | ICD-10-CM | POA: Diagnosis not present

## 2018-10-11 DIAGNOSIS — L821 Other seborrheic keratosis: Secondary | ICD-10-CM | POA: Diagnosis not present

## 2018-10-11 DIAGNOSIS — Z85828 Personal history of other malignant neoplasm of skin: Secondary | ICD-10-CM | POA: Diagnosis not present

## 2018-10-11 DIAGNOSIS — D1801 Hemangioma of skin and subcutaneous tissue: Secondary | ICD-10-CM | POA: Diagnosis not present

## 2018-11-23 DIAGNOSIS — R69 Illness, unspecified: Secondary | ICD-10-CM | POA: Diagnosis not present

## 2018-12-12 DIAGNOSIS — L570 Actinic keratosis: Secondary | ICD-10-CM | POA: Diagnosis not present

## 2018-12-12 DIAGNOSIS — Z85828 Personal history of other malignant neoplasm of skin: Secondary | ICD-10-CM | POA: Diagnosis not present

## 2019-01-04 ENCOUNTER — Ambulatory Visit (INDEPENDENT_AMBULATORY_CARE_PROVIDER_SITE_OTHER): Payer: Medicare HMO | Admitting: Family Medicine

## 2019-01-04 ENCOUNTER — Encounter: Payer: Self-pay | Admitting: Family Medicine

## 2019-01-04 ENCOUNTER — Ambulatory Visit: Payer: Self-pay | Admitting: Physician Assistant

## 2019-01-04 VITALS — BP 130/70 | HR 90 | Temp 98.7°F | Ht 69.0 in | Wt 166.0 lb

## 2019-01-04 DIAGNOSIS — J069 Acute upper respiratory infection, unspecified: Secondary | ICD-10-CM

## 2019-01-04 DIAGNOSIS — K219 Gastro-esophageal reflux disease without esophagitis: Secondary | ICD-10-CM

## 2019-01-04 DIAGNOSIS — R05 Cough: Secondary | ICD-10-CM

## 2019-01-04 DIAGNOSIS — R059 Cough, unspecified: Secondary | ICD-10-CM

## 2019-01-04 MED ORDER — BENZONATATE 100 MG PO CAPS: 100.0000 mg | ORAL_CAPSULE | Freq: Three times a day (TID) | ORAL | 0 refills | Status: DC

## 2019-01-04 MED ORDER — FLUTICASONE PROPIONATE 50 MCG/ACT NA SUSP: 2.0000 | Freq: Every day | NASAL | 6 refills | Status: DC

## 2019-01-04 NOTE — Progress Notes (Signed)
Patient ID: Walter [name], male   DOB: 12-28-1945, 73 y.o.   MRN: [id number]  PCP: Brunetta Jeans, PA-C  Subjective:  Walter [name] is a 73 y.o. year old very pleasant male patient who presents with  symptoms including nasal congestion, cough that is dry, mild chest congestion.  - does not have wheeze as well -started: one day ago, cough has been present intermittently for "several months" but improve. -previous treatments: Mucinex has provided partial benefit -sick contacts/travel/risks: denies flu exposure. No recent sick contact exposure No recent antibiotic therapy. He takes prilosec OTC and tums for acid reflux. Wife states that he does take Tums often nearly every day.  Influenza vaccine is UTD  ROS-denies fever, NVD, tooth pain, myalgias. Denies significant shortness of breath.   Pertinent Past Medical History- DM, HTN, GERD  Patient Active Problem List   Diagnosis Date Noted  . Stricture and stenosis of esophagus 06/07/2012  . B12 deficiency 06/07/2012  . GERD 05/07/2009  . PERIPHERAL VASCULAR DISEASE 03/13/2008  . Diabetes mellitus with peripheral circulatory disorder ([place]) 05/24/2007  . Essential hypertension 05/24/2007  . Coronary atherosclerosis 05/24/2007    Medications- reviewed  Current Outpatient Medications  Medication Sig Dispense Refill  . amLODipine (NORVASC) 5 MG tablet Take 1 tablet (5 mg total) by mouth daily. 90 tablet 2  . aspirin 81 MG tablet Take 81 mg by mouth daily.      Marland Kitchen atorvastatin (LIPITOR) 40 MG tablet Take 1 tablet (40 mg total) by mouth daily. 90 tablet 1  . cimetidine (TAGAMET) 400 MG tablet Take 1 tablet (400 mg total) by mouth at bedtime. 90 tablet 4  . Dihydroxyaluminum Sod Carb (ROLAIDS PO) Take by mouth as needed.    Marland Kitchen losartan (COZAAR) 100 MG tablet Take 1 tablet (100 mg total) by mouth daily. 90 tablet 3  . metFORMIN (GLUCOPHAGE) 1000 MG tablet TAKE 1 TABLET BY MOUTH TWICE DAILY WITH FOOD 180 tablet 1  . vitamin B-12  (CYANOCOBALAMIN) 1000 MCG tablet Take 1,000 mcg by mouth daily.     No current facility-administered medications for this visit.     Objective: BP 130/70 (BP Location: Left Arm, Patient Position: Sitting, Cuff Size: Normal)   Pulse 90   Temp 98.7 F (37.1 C) (Oral)   Ht 5\' 9"  (1.753 m)   Wt 166 lb 0.6 oz (75.3 kg)   SpO2 97%   BMI 24.52 kg/m  Gen: NAD, resting comfortably HEENT: Turbinates mildly erythematous, TMs normal bilaterally, pharynx mildly erythematous with no exudate or edema, post nasal drip present, no sinus tenderness CV: Normal rate, regular rhythm, and normal heart sounds  Lungs: CTAB no crackles, wheeze, rhonchi  Ext: no edema Skin: warm, dry, no rash  Assessment/Plan: 1. Acute upper respiratory infection, unspecified Supportive treatment, symptom duration of one day present, suspect viral etiology and allergic component that are aggravating symptoms. Advised patient on supportive measures:  Get rest, drink plenty of fluids, nasal saline rinses,Mucinex, Flonase, and Zyrtec can be added if needed. Advised follow up if worsening symptoms or no improvement. Patient and wife verbalizes understanding.   - fluticasone (FLONASE) 50 MCG/ACT nasal spray; Place 2 sprays into both nostrils daily.  Dispense: 16 g; Refill: 6  2. Gastroesophageal reflux disease, esophagitis presence not specified Cough that occurs intermittently may be related to GERD symptoms. He reports eating "a lot of chocolate at night." We discussed triggers for GERD and avoidance of triggers for symptoms. If no improvement with current medication regimen and avoidance  of symptoms, will follow up with PCP.   3. Cough Supportive treatment, suspect there is a component of GERD that is contributing. Advised fluids and will provide benzonatate.  - benzonatate (TESSALON) 100 MG capsule; Take 1 capsule (100 mg total) by mouth 3 (three) times daily.  Dispense: 20 capsule; Refill: 0   We discussed that we did not  find any infection that had higher probability of being bacterial such as pneumonia, strep throat, ear infection, bacterial sinusitis. We discussed signs that bacterial infection may have developed particularly fever or shortness of breath and reasons for follow up (symptoms worsen, last past expected time frame, new concerns arise).   Laurita Quint, FNP

## 2019-01-04 NOTE — Patient Instructions (Signed)
It was a pleasure to meet you today.  Your symptoms are most likely related to a viral illness. Please drink plenty of water so that your urine is pale yellow or clear. Also, get plenty of rest, flonase can be added as well as Zyrtec for symptoms. Nasal saline rinses are also good for symptoms. If symptoms do not improve, worsen, or you develop a fever >101, please follow up for further evaluation.   Upper Respiratory Infection, Adult An upper respiratory infection (URI) affects the nose, throat, and upper air passages. URIs are caused by germs (viruses). The most common type of URI is often called "the common cold." Medicines cannot cure URIs, but you can do things at home to relieve your symptoms. URIs usually get better within 7-10 days. Follow these instructions at home: Activity  Rest as needed.  If you have a fever, stay home from work or school until your fever is gone, or until your doctor says you may return to work or school. ? You should stay home until you cannot spread the infection anymore (you are not contagious). ? Your doctor may have you wear a face mask so you have less risk of spreading the infection. Relieving symptoms  Gargle with a salt-water mixture 3-4 times a day or as needed. To make a salt-water mixture, completely dissolve -1 tsp of salt in 1 cup of warm water.  Use a cool-mist humidifier to add moisture to the air. This can help you breathe more easily. Eating and drinking   Drink enough fluid to keep your pee (urine) pale yellow.  Eat soups and other clear broths. General instructions   Take over-the-counter and prescription medicines only as told by your doctor. These include cold medicines, fever reducers, and cough suppressants.  Do not use any products that contain nicotine or tobacco. These include cigarettes and e-cigarettes. If you need help quitting, ask your doctor.  Avoid being where people are smoking (avoid secondhand smoke).  Make sure  you get regular shots and get the flu shot every year.  Keep all follow-up visits as told by your doctor. This is important. How to avoid spreading infection to others   Wash your hands often with soap and water. If you do not have soap and water, use hand sanitizer.  Avoid touching your mouth, face, eyes, or nose.  Cough or sneeze into a tissue or your sleeve or elbow. Do not cough or sneeze into your hand or into the air. Contact a doctor if:  You are getting worse, not better.  You have any of these: ? A fever. ? Chills. ? Brown or red mucus in your nose. ? Yellow or brown fluid (discharge)coming from your nose. ? Pain in your face, especially when you bend forward. ? Swollen neck glands. ? Pain with swallowing. ? White areas in the back of your throat. Get help right away if:  You have shortness of breath that gets worse.  You have very bad or constant: ? Headache. ? Ear pain. ? Pain in your forehead, behind your eyes, and over your cheekbones (sinus pain). ? Chest pain.  You have long-lasting (chronic) lung disease along with any of these: ? Wheezing. ? Long-lasting cough. ? Coughing up blood. ? A change in your usual mucus.  You have a stiff neck.  You have changes in your: ? Vision. ? Hearing. ? Thinking. ? Mood. Summary  An upper respiratory infection (URI) is caused by a germ called a virus. The most  common type of URI is often called "the common cold."  URIs usually get better within 7-10 days.  Take over-the-counter and prescription medicines only as told by your doctor. This information is not intended to replace advice given to you by your health care provider. Make sure you discuss any questions you have with your health care provider. Document Released: 04/05/2008 Document Revised: 06/10/2017 Document Reviewed: 06/10/2017 Elsevier Interactive Patient Education  2019 [employer].

## 2019-01-04 NOTE — Telephone Encounter (Signed)
Pt. Reports he started coughing yesterday. Non-productive. "My wife says I sound like I have bronchitis." No fever or other symptoms.No availability at [facility]. Appointment made at [facility] office.  Reason for Disposition . [1] Continuous (nonstop) coughing interferes with work or school AND [2] no improvement using cough treatment per protocol  Answer Assessment - Initial Assessment Questions 1. ONSET: "When did the cough begin?"      Yesterday 2. SEVERITY: "How bad is the cough today?"      Moderate 3. RESPIRATORY DISTRESS: "Describe your breathing."      No distress 4. FEVER: "Do you have a fever?" If so, ask: "What is your temperature, how was it measured, and when did it start?"     No 5. HEMOPTYSIS: "Are you coughing up any blood?" If so ask: "How much?" (flecks, streaks, tablespoons, etc.)     No 6. TREATMENT: "What have you done so far to treat the cough?" (e.g., meds, fluids, humidifier)     OTC Night time cold and flu 7. CARDIAC HISTORY: "Do you have any history of heart disease?" (e.g., heart attack, congestive heart failure)      Stent placement 8. LUNG HISTORY: "Do you have any history of lung disease?"  (e.g., pulmonary embolus, asthma, emphysema)     No 9. PE RISK FACTORS: "Do you have a history of blood clots?" (or: recent major surgery, recent prolonged travel, bedridden)     No 10. OTHER SYMPTOMS: "Do you have any other symptoms? (e.g., runny nose, wheezing, chest pain)       No 11. PREGNANCY: "Is there any chance you are pregnant?" "When was your last menstrual period?"       N/A 12. TRAVEL: "Have you traveled out of the country in the last month?" (e.g., travel history, exposures)       No  Protocols used: COUGH - ACUTE NON-PRODUCTIVE-A-AH

## 2019-01-06 ENCOUNTER — Encounter (HOSPITAL_COMMUNITY): Payer: Self-pay

## 2019-01-06 ENCOUNTER — Emergency Department (HOSPITAL_COMMUNITY): Payer: Medicare HMO

## 2019-01-06 ENCOUNTER — Observation Stay (HOSPITAL_COMMUNITY)
Admission: EM | Admit: 2019-01-06 | Discharge: 2019-01-07 | Disposition: A | Discharge status: Home / Self Care | Payer: Medicare HMO | Attending: Internal Medicine | Admitting: Internal Medicine

## 2019-01-06 ENCOUNTER — Other Ambulatory Visit: Payer: Self-pay

## 2019-01-06 DIAGNOSIS — R2689 Other abnormalities of gait and mobility: Secondary | ICD-10-CM | POA: Diagnosis not present

## 2019-01-06 DIAGNOSIS — J069 Acute upper respiratory infection, unspecified: Secondary | ICD-10-CM

## 2019-01-06 DIAGNOSIS — R2681 Unsteadiness on feet: Secondary | ICD-10-CM | POA: Diagnosis not present

## 2019-01-06 DIAGNOSIS — R531 Weakness: Secondary | ICD-10-CM | POA: Diagnosis not present

## 2019-01-06 DIAGNOSIS — I2510 Atherosclerotic heart disease of native coronary artery without angina pectoris: Secondary | ICD-10-CM | POA: Insufficient documentation

## 2019-01-06 DIAGNOSIS — E1151 Type 2 diabetes mellitus with diabetic peripheral angiopathy without gangrene: Secondary | ICD-10-CM | POA: Insufficient documentation

## 2019-01-06 DIAGNOSIS — R944 Abnormal results of kidney function studies: Secondary | ICD-10-CM | POA: Diagnosis not present

## 2019-01-06 DIAGNOSIS — Z7982 Long term (current) use of aspirin: Secondary | ICD-10-CM | POA: Diagnosis not present

## 2019-01-06 DIAGNOSIS — K219 Gastro-esophageal reflux disease without esophagitis: Secondary | ICD-10-CM | POA: Insufficient documentation

## 2019-01-06 DIAGNOSIS — Z79899 Other long term (current) drug therapy: Secondary | ICD-10-CM | POA: Insufficient documentation

## 2019-01-06 DIAGNOSIS — R05 Cough: Secondary | ICD-10-CM

## 2019-01-06 DIAGNOSIS — Z955 Presence of coronary angioplasty implant and graft: Secondary | ICD-10-CM | POA: Insufficient documentation

## 2019-01-06 DIAGNOSIS — R059 Cough, unspecified: Secondary | ICD-10-CM

## 2019-01-06 DIAGNOSIS — Z87891 Personal history of nicotine dependence: Secondary | ICD-10-CM | POA: Insufficient documentation

## 2019-01-06 DIAGNOSIS — J209 Acute bronchitis, unspecified: Principal | ICD-10-CM

## 2019-01-06 DIAGNOSIS — I10 Essential (primary) hypertension: Secondary | ICD-10-CM | POA: Insufficient documentation

## 2019-01-06 DIAGNOSIS — G9389 Other specified disorders of brain: Secondary | ICD-10-CM | POA: Diagnosis not present

## 2019-01-06 DIAGNOSIS — Z794 Long term (current) use of insulin: Secondary | ICD-10-CM | POA: Diagnosis not present

## 2019-01-06 DIAGNOSIS — E785 Hyperlipidemia, unspecified: Secondary | ICD-10-CM | POA: Diagnosis not present

## 2019-01-06 DIAGNOSIS — E860 Dehydration: Secondary | ICD-10-CM | POA: Diagnosis not present

## 2019-01-06 LAB — COMPREHENSIVE METABOLIC PANEL
ALT: 43 U/L (ref 0–44)
AST: 55 U/L — ABNORMAL HIGH (ref 15–41)
Albumin: 3.9 g/dL (ref 3.5–5.0)
Alkaline Phosphatase: 107 U/L (ref 38–126)
Anion gap: 9 (ref 5–15)
BILIRUBIN TOTAL: 2 mg/dL — AB (ref 0.3–1.2)
BUN: 26 mg/dL — ABNORMAL HIGH (ref 8–23)
CO2: 24 mmol/L (ref 22–32)
CREATININE: 1.17 mg/dL (ref 0.61–1.24)
Calcium: 8.7 mg/dL — ABNORMAL LOW (ref 8.9–10.3)
Chloride: 106 mmol/L (ref 98–111)
GFR calc Af Amer: >60 mL/min (ref >60)
GFR calc non Af Amer: >60 mL/min (ref >60)
Glucose, Bld: 139 mg/dL — ABNORMAL HIGH (ref 70–99)
Potassium: 5.9 mmol/L — ABNORMAL HIGH (ref 3.5–5.1)
Sodium: 139 mmol/L (ref 135–145)
Total Protein: 7.7 g/dL (ref 6.5–8.1)

## 2019-01-06 LAB — URINALYSIS, ROUTINE W REFLEX MICROSCOPIC
Bilirubin Urine: NEGATIVE
Glucose, UA: NEGATIVE mg/dL
Hgb urine dipstick: NEGATIVE
Ketones, ur: NEGATIVE mg/dL
Leukocytes,Ua: NEGATIVE
Nitrite: NEGATIVE
PH: 5 (ref 5.0–8.0)
Protein, ur: NEGATIVE mg/dL
SPECIFIC GRAVITY, URINE: 1.02 (ref 1.005–1.030)

## 2019-01-06 LAB — CBC WITH DIFFERENTIAL/PLATELET
Abs Immature Granulocytes: 0.03 10*3/uL (ref 0.00–0.07)
Basophils Absolute: 0 10*3/uL (ref 0.0–0.1)
Basophils Relative: 0 %
Eosinophils Absolute: 0.1 10*3/uL (ref 0.0–0.5)
Eosinophils Relative: 1 %
HEMATOCRIT: 37 % — AB (ref 39.0–52.0)
HEMOGLOBIN: 11.3 g/dL — AB (ref 13.0–17.0)
Immature Granulocytes: 0 %
LYMPHS ABS: 0.8 10*3/uL (ref 0.7–4.0)
Lymphocytes Relative: 7 %
MCH: 27.6 pg (ref 26.0–34.0)
MCHC: 30.5 g/dL (ref 30.0–36.0)
MCV: 90.2 fL (ref 80.0–100.0)
Monocytes Absolute: 0.7 10*3/uL (ref 0.1–1.0)
Monocytes Relative: 6 %
NEUTROS ABS: 10.1 10*3/uL — AB (ref 1.7–7.7)
Neutrophils Relative %: 86 %
Platelets: 219 10*3/uL (ref 150–400)
RBC: 4.1 MIL/uL — ABNORMAL LOW (ref 4.22–5.81)
RDW: 13.7 % (ref 11.5–15.5)
WBC: 11.8 10*3/uL — ABNORMAL HIGH (ref 4.0–10.5)
nRBC: 0 % (ref 0.0–0.2)

## 2019-01-06 LAB — LACTIC ACID, PLASMA
Lactic Acid, Venous: 1.6 mmol/L (ref 0.5–1.9)
Lactic Acid, Venous: 1.4 mmol/L (ref 0.5–1.9)

## 2019-01-06 LAB — TROPONIN I: Troponin I: <0.03 ng/mL (ref <0.03)

## 2019-01-06 LAB — INFLUENZA PANEL BY PCR (TYPE A & B)
Influenza A By PCR: NEGATIVE
Influenza B By PCR: NEGATIVE

## 2019-01-06 LAB — GROUP A STREP BY PCR: Group A Strep by PCR: NOT DETECTED

## 2019-01-06 MED ORDER — INSULIN ASPART 100 UNIT/ML ~~LOC~~ SOLN
0.0000 [IU] | Freq: Three times a day (TID) | SUBCUTANEOUS | Status: DC
  Administered 2019-01-07: 2 [IU] via SUBCUTANEOUS
  Administered 2019-01-07: 1 [IU] via SUBCUTANEOUS
  Filled 2019-01-06: qty 1

## 2019-01-06 MED ORDER — ACETAMINOPHEN 325 MG PO TABS: 650.0000 mg | ORAL_TABLET | Freq: Four times a day (QID) | ORAL | Status: DC | PRN

## 2019-01-06 MED ORDER — IPRATROPIUM-ALBUTEROL 0.5-2.5 (3) MG/3ML IN SOLN
3.0000 mL | Freq: Four times a day (QID) | RESPIRATORY_TRACT | Status: DC
  Administered 2019-01-07 (×3): 3 mL via RESPIRATORY_TRACT
  Filled 2019-01-06 (×3): qty 3

## 2019-01-06 MED ORDER — SODIUM CHLORIDE 0.9 % IV SOLN
INTRAVENOUS | Status: DC
  Administered 2019-01-06: 20:00:00 via INTRAVENOUS

## 2019-01-06 MED ORDER — ACETAMINOPHEN 650 MG RE SUPP: 650.0000 mg | Freq: Four times a day (QID) | RECTAL | Status: DC | PRN

## 2019-01-06 MED ORDER — LOSARTAN POTASSIUM 25 MG PO TABS
100.0000 mg | ORAL_TABLET | Freq: Every day | ORAL | Status: DC
  Administered 2019-01-07: 100 mg via ORAL
  Filled 2019-01-06: qty 4

## 2019-01-06 MED ORDER — ENOXAPARIN SODIUM 40 MG/0.4ML ~~LOC~~ SOLN
40.0000 mg | SUBCUTANEOUS | Status: DC
  Administered 2019-01-06: 40 mg via SUBCUTANEOUS
  Filled 2019-01-06: qty 0.4

## 2019-01-06 MED ORDER — SODIUM CHLORIDE 0.9 % IV BOLUS
250.0000 mL | Freq: Once | INTRAVENOUS | Status: AC
  Administered 2019-01-06: 250 mL via INTRAVENOUS

## 2019-01-06 MED ORDER — DOXYCYCLINE HYCLATE 100 MG PO TABS
100.0000 mg | ORAL_TABLET | Freq: Two times a day (BID) | ORAL | Status: DC
  Administered 2019-01-06 – 2019-01-07 (×2): 100 mg via ORAL
  Filled 2019-01-06 (×2): qty 1

## 2019-01-06 MED ORDER — ASPIRIN EC 81 MG PO TBEC
81.0000 mg | DELAYED_RELEASE_TABLET | Freq: Every day | ORAL | Status: DC
  Administered 2019-01-07: 81 mg via ORAL
  Filled 2019-01-06: qty 1

## 2019-01-06 MED ORDER — AMLODIPINE BESYLATE 5 MG PO TABS
5.0000 mg | ORAL_TABLET | Freq: Every day | ORAL | Status: DC
  Administered 2019-01-07: 5 mg via ORAL
  Filled 2019-01-06: qty 1

## 2019-01-06 MED ORDER — ONDANSETRON HCL 4 MG/2ML IJ SOLN: 4.0000 mg | Freq: Four times a day (QID) | INTRAMUSCULAR | Status: DC | PRN

## 2019-01-06 MED ORDER — GUAIFENESIN ER 600 MG PO TB12
600.0000 mg | ORAL_TABLET | Freq: Two times a day (BID) | ORAL | Status: DC
  Administered 2019-01-06 – 2019-01-07 (×2): 600 mg via ORAL
  Filled 2019-01-06 (×2): qty 1

## 2019-01-06 MED ORDER — IPRATROPIUM-ALBUTEROL 0.5-2.5 (3) MG/3ML IN SOLN
3.0000 mL | Freq: Once | RESPIRATORY_TRACT | Status: AC
  Administered 2019-01-06: 3 mL via RESPIRATORY_TRACT
  Filled 2019-01-06: qty 3

## 2019-01-06 MED ORDER — ATORVASTATIN CALCIUM 40 MG PO TABS
40.0000 mg | ORAL_TABLET | Freq: Every day | ORAL | Status: DC
  Filled 2019-01-06: qty 1

## 2019-01-06 MED ORDER — ONDANSETRON HCL 4 MG PO TABS: 4.0000 mg | ORAL_TABLET | Freq: Four times a day (QID) | ORAL | Status: DC | PRN

## 2019-01-06 NOTE — ED Notes (Signed)
Pt unable to stand for orthostatics  

## 2019-01-06 NOTE — ED Notes (Signed)
Pt given meal tray and sprite. 

## 2019-01-06 NOTE — ED Triage Notes (Signed)
Pt was seen on 01/04/2019 at PCP office and Dx with URI. Pt symptoms have persisted and gotten worse per family. Pt has productive cough and low grade fever. Pt has weakness and difficulty walking which is not normal for him.

## 2019-01-06 NOTE — H&P (Signed)
TRH H&P    Patient Demographics:    Walter [name], is a 74 y.o. male  MRN: [id number]  DOB - [birth year]  Admit Date - 01/06/2019  Referring MD/NP/PA: Dr. Thurnell Garbe  Outpatient Primary MD for the patient is Brunetta Jeans, PA-C  Patient coming from: Home  Chief complaint-generalized weakness/fatigue   HPI:    Walter [name]  is a 73 y.o. male, with history of CAD status post stent placement in 2003, diabetes mellitus type 2, hypertension, GERD, peripheral vascular disease who came to hospital with complaints of generalized weakness and fatigue for past 1 week.  As per patient wife he has been congested and has been having coughing with yellow-colored phlegm and runny nose.  He was recently seen by PMD and was prescribed Tessalon Perles.  He has been taking Tessalon Perles 3 times a day.  Wife has noted that patient has been getting weaker and having difficulty walking.  Today he was not able to stand and fell back into a chair at home. He denies nausea vomiting or diarrhea. Denies chest pain or shortness of breath. Complains of cough with yellow phlegm. He does have history of allergies/sinusitis Denies headache. Denies abdominal pain or dysuria  In the ED CT head was negative, chest x-ray showed no acute infiltrate Lab work was unremarkable    Review of systems:    In addition to the HPI above,    All other systems reviewed and are negative.    Past History of the following :    Past Medical History:  Diagnosis Date  . Coronary artery disease   . Diabetes mellitus   . Diverticulosis   . Esophageal stricture   . GERD (gastroesophageal reflux disease)   . Hypertension   . Peripheral vascular disease [facility])       Past Surgical History:  Procedure Laterality Date  . CORONARY ANGIOPLASTY WITH STENT PLACEMENT    . ESOPHAGOGASTRODUODENOSCOPY  06/07/2012   Procedure: ESOPHAGOGASTRODUODENOSCOPY  (EGD);  Surgeon: Lafayette Dragon, MD;  Location: [facility];  Service: Endoscopy;  Laterality: N/A;  . SHOULDER SURGERY  2006      Social History:      Social History   Tobacco Use  . Smoking status: Former Research scientist (life sciences)  . Smokeless tobacco: Never Used  Substance Use Topics  . Alcohol use: No       Family History :     Family History  Problem Relation Age of Onset  . Breast cancer Mother   . Healthy Son   . Healthy Son   . Colon cancer Neg Hx       Home Medications:   Prior to Admission medications   Medication Sig Start Date End Date Taking? Authorizing Provider  amLODipine (NORVASC) 5 MG tablet Take 1 tablet (5 mg total) by mouth daily. 09/19/18  Yes Brunetta Jeans, PA-C  atorvastatin (LIPITOR) 40 MG tablet Take 1 tablet (40 mg total) by mouth daily. 08/17/18  Yes Brunetta Jeans, PA-C  benzonatate (TESSALON) 100 MG capsule Take 1 capsule (100 mg total)  by mouth 3 (three) times daily. 01/04/19  Yes Kordsmeier, Gregary Signs, FNP  cimetidine (TAGAMET) 400 MG tablet Take 1 tablet (400 mg total) by mouth at bedtime. 02/02/18  Yes Marletta [name], MD  Dihydroxyaluminum Sod Carb (ROLAIDS PO) Take by mouth as needed.   Yes [provider]  fluticasone (FLONASE) 50 MCG/ACT nasal spray Place 2 sprays into both nostrils daily. 01/04/19  Yes Kordsmeier, Gregary Signs, FNP  guaiFENesin (MUCINEX) 600 MG 12 hr tablet Take 600 mg by mouth 2 (two) times daily.   Yes [provider]  losartan (COZAAR) 100 MG tablet Take 1 tablet (100 mg total) by mouth daily. 02/02/18  Yes Marletta [name], MD  metFORMIN (GLUCOPHAGE) 1000 MG tablet TAKE 1 TABLET BY MOUTH TWICE DAILY WITH FOOD 07/24/18  Yes Dorena Cookey, MD  vitamin B-12 (CYANOCOBALAMIN) 1000 MCG tablet Take 1,000 mcg by mouth daily.   Yes [provider]  aspirin 81 MG tablet Take 81 mg by mouth daily.      [provider]     Allergies:    No Known Allergies   Physical Exam:   Vitals  Blood pressure  125/84, pulse (!) 113, temperature 98.8 F (37.1 C), temperature source Oral, resp. rate (!) 27, height 5\' 10"  (1.778 m), weight 73.9 kg, SpO2 92 %.  1.  General: Appears in no acute distress  2. Psychiatric: Alert, oriented x3, intact insight and judgment  3. Neurologic: Cranial nerve II - 12 grossly intact, motor strength 5/5 in all extremities  4. HEENMT:  Atraumatic normocephalic, extraocular muscle intact, oral mucosa is pink and moist  5. Respiratory : Scattered rhonchi bilaterally  6. Cardiovascular : S1-S2, regular, no murmur auscultated  7. Gastrointestinal:  Abdomen is soft, nontender, no organomegaly     Data Review:    CBC Recent Labs  Lab 01/06/19 1955  WBC 11.8*  HGB 11.3*  HCT 37.0*  PLT 219  MCV 90.2  MCH 27.6  MCHC 30.5  RDW 13.7  LYMPHSABS 0.8  MONOABS 0.7  EOSABS 0.1  BASOSABS 0.0   ------------------------------------------------------------------------------------------------------------------  Results for orders placed or performed during the hospital encounter of 01/06/19 (from the past 48 hour(s))  Group A Strep by PCR     Status: None   Collection Time: 01/06/19  7:54 PM  Result Value Ref Range   Group A Strep by PCR NOT DETECTED NOT DETECTED    Comment: Performed at [facility], [address], [place], [place] [address]  Influenza panel by PCR (type A & B)     Status: None   Collection Time: 01/06/19  7:54 PM  Result Value Ref Range   Influenza A By PCR NEGATIVE NEGATIVE   Influenza B By PCR NEGATIVE NEGATIVE    Comment: (NOTE) The Xpert Xpress Flu assay is intended as an aid in the diagnosis of  influenza and should not be used as a sole basis for treatment.  This  assay is FDA approved for nasopharyngeal swab specimens only. Nasal  washings and aspirates are unacceptable for Xpert Xpress Flu testing. Performed at [facility], [address] [place], [place] [address]   Urinalysis, Routine w reflex microscopic     Status:  None   Collection Time: 01/06/19  7:55 PM  Result Value Ref Range   Color, Urine YELLOW YELLOW   APPearance CLEAR CLEAR   Specific Gravity, Urine 1.020 1.005 - 1.030   pH 5.0 5.0 - 8.0   Glucose, UA NEGATIVE NEGATIVE mg/dL  Hgb urine dipstick NEGATIVE NEGATIVE   Bilirubin Urine NEGATIVE NEGATIVE   Ketones, ur NEGATIVE NEGATIVE mg/dL   Protein, ur NEGATIVE NEGATIVE mg/dL   Nitrite NEGATIVE NEGATIVE   Leukocytes,Ua NEGATIVE NEGATIVE    Comment: Performed at [facility], [address] [place], [place] [address]  Comprehensive metabolic panel     Status: Abnormal   Collection Time: 01/06/19  7:55 PM  Result Value Ref Range   Sodium 139 135 - 145 mmol/L   Potassium 5.9 (H) 3.5 - 5.1 mmol/L   Chloride 106 98 - 111 mmol/L   CO2 24 22 - 32 mmol/L   Glucose, Bld 139 (H) 70 - 99 mg/dL   BUN 26 (H) 8 - 23 mg/dL   Creatinine, Ser 1.17 0.61 - 1.24 mg/dL   Calcium 8.7 (L) 8.9 - 10.3 mg/dL   Total Protein 7.7 6.5 - 8.1 g/dL   Albumin 3.9 3.5 - 5.0 g/dL   AST 55 (H) 15 - 41 U/L   ALT 43 0 - 44 U/L   Alkaline Phosphatase 107 38 - 126 U/L   Total Bilirubin 2.0 (H) 0.3 - 1.2 mg/dL   GFR calc non Af Amer >60 >60 mL/min   GFR calc Af Amer >60 >60 mL/min   Anion gap 9 5 - 15    Comment: Performed at [facility], [address] [place], [place] [address]  Troponin I - Once     Status: None   Collection Time: 01/06/19  7:55 PM  Result Value Ref Range   Troponin I <0.03 <0.03 ng/mL    Comment: Performed at [facility] [place], [address] [place], [place] [address]  Lactic acid, plasma     Status: None   Collection Time: 01/06/19  7:55 PM  Result Value Ref Range   Lactic Acid, Venous 1.6 0.5 - 1.9 mmol/L    Comment: Performed at [facility], [address], [place], [place] [address]  CBC with Differential     Status: Abnormal   Collection Time: 01/06/19  7:55 PM  Result Value Ref Range   WBC 11.8 (H) 4.0 - 10.5 K/uL   RBC 4.10 (L) 4.22 - 5.81 MIL/uL   Hemoglobin 11.3 (L) 13.0 - 17.0 g/dL     HCT 37.0 (L) 39.0 - 52.0 %   MCV 90.2 80.0 - 100.0 fL   MCH 27.6 26.0 - 34.0 pg   MCHC 30.5 30.0 - 36.0 g/dL   RDW 13.7 11.5 - 15.5 %   Platelets 219 150 - 400 K/uL   nRBC 0.0 0.0 - 0.2 %   Neutrophils Relative % 86 %   Neutro Abs 10.1 (H) 1.7 - 7.7 K/uL   Lymphocytes Relative 7 %   Lymphs Abs 0.8 0.7 - 4.0 K/uL   Monocytes Relative 6 %   Monocytes Absolute 0.7 0.1 - 1.0 K/uL   Eosinophils Relative 1 %   Eosinophils Absolute 0.1 0.0 - 0.5 K/uL   Basophils Relative 0 %   Basophils Absolute 0.0 0.0 - 0.1 K/uL   Immature Granulocytes 0 %   Abs Immature Granulocytes 0.03 0.00 - 0.07 K/uL    Comment: Performed at [facility], [address] [place],  [address]    Chemistries  Recent Labs  Lab 01/06/19 1955  NA 139  K 5.9*  CL 106  CO2 24  GLUCOSE 139*  BUN 26*  CREATININE 1.17  CALCIUM 8.7*  AST 55*  ALT 43  ALKPHOS 107  BILITOT 2.0*   ------------------------------------------------------------------------------------------------------------------  ------------------------------------------------------------------------------------------------------------------  GFR: Estimated Creatinine Clearance: 58.1 mL/min (by C-G formula based on SCr of 1.17 mg/dL). Liver Function Tests: Recent Labs  Lab 01/06/19 1955  AST 55*  ALT 43  ALKPHOS 107  BILITOT 2.0*  PROT 7.7  ALBUMIN 3.9   No results for input(s): LIPASE, AMYLASE in the last 168 hours. No results for input(s): AMMONIA in the last 168 hours. Coagulation Profile: No results for input(s): INR, PROTIME in the last 168 hours. Cardiac Enzymes: Recent Labs  Lab 01/06/19 1955  TROPONINI <0.03   BNP (last 3 results) No results for input(s): PROBNP in the last 8760 hours. HbA1C: No results for input(s): HGBA1C in the last 72 hours. CBG: No results for input(s): GLUCAP in the last 168 hours. Lipid Profile: No results for input(s): CHOL, HDL, LDLCALC, TRIG, CHOLHDL, LDLDIRECT in the last 72  hours. Thyroid Function Tests: No results for input(s): TSH, T4TOTAL, FREET4, T3FREE, THYROIDAB in the last 72 hours. Anemia Panel: No results for input(s): VITAMINB12, FOLATE, FERRITIN, TIBC, IRON, RETICCTPCT in the last 72 hours.  --------------------------------------------------------------------------------------------------------------- Urine analysis:    Component Value Date/Time   COLORURINE YELLOW 01/06/2019 1955   APPEARANCEUR CLEAR 01/06/2019 1955   LABSPEC 1.020 01/06/2019 1955   PHURINE 5.0 01/06/2019 1955   GLUCOSEU NEGATIVE 01/06/2019 1955   HGBUR NEGATIVE 01/06/2019 1955   HGBUR negative 09/17/2009 0847   BILIRUBINUR NEGATIVE 01/06/2019 1955   BILIRUBINUR neg 10/01/2016 [place] 01/06/2019 1955   PROTEINUR NEGATIVE 01/06/2019 1955   UROBILINOGEN 0.2 10/01/2016 1040   UROBILINOGEN 0.2 09/17/2009 0847   NITRITE NEGATIVE 01/06/2019 1955   LEUKOCYTESUR NEGATIVE 01/06/2019 1955      Imaging Results:    Dg Chest 2 View  Result Date: 01/06/2019 CLINICAL DATA:  Cough EXAM: CHEST - 2 VIEW COMPARISON:  None. FINDINGS: Normal heart size. Normal mediastinal contour. No pneumothorax. No pleural effusion. Lungs appear clear, with no acute consolidative airspace disease and no pulmonary edema. IMPRESSION: No active cardiopulmonary disease. Electronically Signed   By: Ilona Sorrel M.D.   On: 01/06/2019 20:57   Ct Head Wo Contrast  Result Date: 01/06/2019 CLINICAL DATA:  Generalized weakness, fever and cough. History of hypertension and diabetes. EXAM: CT HEAD WITHOUT CONTRAST TECHNIQUE: Contiguous axial images were obtained from the base of the skull through the vertex without intravenous contrast. COMPARISON:  None. FINDINGS: BRAIN: No intraparenchymal hemorrhage, mass effect nor midline shift. Moderate parenchymal brain volume loss, greater than expected for age. No hydrocephalus. No hydrocephalus. Patchy supratentorial white matter hypodensities less than  expected for patient's age, though non-specific are most compatible with chronic small vessel ischemic disease. No acute large vascular territory infarcts. No abnormal extra-axial fluid collections. Basal cisterns are patent. VASCULAR: Moderate calcific atherosclerosis of the carotid siphons. SKULL: No skull fracture. No significant scalp soft tissue swelling. SINUSES/ORBITS: Mild paranasal sinus mucosal thickening. Unerupted LEFT maxillary molar. It has mastoid air cells are well aerated.The included ocular globes and orbital contents are non-suspicious. Status post bilateral ocular lens implants. OTHER: None. IMPRESSION: 1. No acute intracranial process. 2. Moderate parenchymal brain volume loss. Electronically Signed   By: Elon Alas M.D.   On: 01/06/2019 22:03    My personal review of EKG: Rhythm NSR, nonspecific ST changes    Assessment & Plan:    Active Problems:   Acute bronchitis   1. Acute bronchitis-patient presented with cough with yellow phlegm, fatigue, upper respiratory symptoms.  Will start him on doxycycline 100 g p.o. twice daily.  DuoNeb every 6  hours, Mucinex 1 tablet p.o. twice daily.  2. Generalized weakness/fatigue-likely from above, poor p.o. intake.  He does have mildly elevated BUN.  Started on IV normal saline.  Will get PT evaluation in a.m.  3. Diabetes mellitus type 2-start sliding scale insulin with NovoLog.  Hold oral hypoglycemic agents.  4. Hypertension-blood pressure stable, continue amlodipine, Cozaar.  5. CAD-stable, continue aspirin, Lipitor     DVT Prophylaxis-   Lovenox   AM Labs Ordered, also please review Full Orders  Family Communication: Admission, patients condition and plan of care including tests being ordered have been discussed with the patient and his wife at bedside who indicate understanding and agree with the plan and Code Status.  Code Status: Full code  Admission status: Observation: Based on patients clinical presentation  and evaluation of above clinical data, I have made determination that patient will need less than 2 midnight stay in the hospital.  Time spent in minutes : 60 minutes   Oswald Hillock M.D on 01/06/2019 at 10:45 PM

## 2019-01-06 NOTE — ED Provider Notes (Signed)
[facility] EMERGENCY DEPARTMENT Provider Note   CSN: [id number] Arrival date & time: 01/06/19  1913    History   Chief Complaint Chief Complaint  Patient presents with  . Fatigue  . Cough    HPI Walter [name] is a 73 y.o. male.     HPI  Pt was seen at [facility]. Per pt and his family, c/o gradual onset and worsening of persistent generalized weakness for the past 1 week.  Has been associated with "congested cough" and generalized body aches/fatigue. Pt was evaluated by his PMD 2 days ago, dx URI. Family states pt "is just getting worse;" family states pt was starting to "shuffle" when trying to walk, and "was too weak to even stand today," and "falling back" into chair to sit. Denies CP/palpitations, no SOB, no abd pain, no N/V/D, no back pain, no fevers, no rash, no focal motor weakness, no tingling/numbness in extremities, no facial droop, no slurred speech, no ataxia.      Past Medical History:  Diagnosis Date  . Coronary artery disease   . Diabetes mellitus   . Diverticulosis   . Esophageal stricture   . GERD (gastroesophageal reflux disease)   . Hypertension   . Peripheral vascular disease [facility])     Patient Active Problem List   Diagnosis Date Noted  . Stricture and stenosis of esophagus 06/07/2012  . B12 deficiency 06/07/2012  . GERD 05/07/2009  . PERIPHERAL VASCULAR DISEASE 03/13/2008  . Diabetes mellitus with peripheral circulatory disorder ([place]) 05/24/2007  . Essential hypertension 05/24/2007  . Coronary atherosclerosis 05/24/2007    Past Surgical History:  Procedure Laterality Date  . CORONARY ANGIOPLASTY WITH STENT PLACEMENT    . ESOPHAGOGASTRODUODENOSCOPY  06/07/2012   Procedure: ESOPHAGOGASTRODUODENOSCOPY (EGD);  Surgeon: Lafayette Dragon, MD;  Location: [facility];  Service: Endoscopy;  Laterality: N/A;  . SHOULDER SURGERY  2006        Home Medications    Prior to Admission medications   Medication Sig Start Date End Date Taking? Authorizing  Provider  amLODipine (NORVASC) 5 MG tablet Take 1 tablet (5 mg total) by mouth daily. 09/19/18   Brunetta Jeans, PA-C  aspirin 81 MG tablet Take 81 mg by mouth daily.      [provider]  atorvastatin (LIPITOR) 40 MG tablet Take 1 tablet (40 mg total) by mouth daily. 08/17/18   Brunetta Jeans, PA-C  benzonatate (TESSALON) 100 MG capsule Take 1 capsule (100 mg total) by mouth 3 (three) times daily. 01/04/19   Delano Metz, FNP  cimetidine (TAGAMET) 400 MG tablet Take 1 tablet (400 mg total) by mouth at bedtime. 02/02/18   Marletta [name], MD  Dihydroxyaluminum Sod Carb (ROLAIDS PO) Take by mouth as needed.    [provider]  fluticasone (FLONASE) 50 MCG/ACT nasal spray Place 2 sprays into both nostrils daily. 01/04/19   Delano Metz, FNP  losartan (COZAAR) 100 MG tablet Take 1 tablet (100 mg total) by mouth daily. 02/02/18   Marletta [name], MD  metFORMIN (GLUCOPHAGE) 1000 MG tablet TAKE 1 TABLET BY MOUTH TWICE DAILY WITH FOOD 07/24/18   Dorena Cookey, MD  vitamin B-12 (CYANOCOBALAMIN) 1000 MCG tablet Take 1,000 mcg by mouth daily.    [provider]    Family History Family History  Problem Relation Age of Onset  . Breast cancer Mother   . Healthy Son   . Healthy Son   . Colon cancer Neg Hx     Social History  Social History   Tobacco Use  . Smoking status: Former Research scientist (life sciences)  . Smokeless tobacco: Never Used  Substance Use Topics  . Alcohol use: No  . Drug use: No     Allergies   Patient has no known allergies.   Review of Systems Review of Systems ROS: Statement: All systems negative except as marked or noted in the HPI; Constitutional: Negative for fever and chills. +generalized weakness/fatigue.; ; Eyes: Negative for eye pain, redness and discharge. ; ; ENMT: Negative for ear pain, hoarseness, nasal congestion, sinus pressure and sore throat. ; ; Cardiovascular: Negative for chest pain, palpitations, diaphoresis, dyspnea and  peripheral edema. ; ; Respiratory: +cough. Negative for wheezing and stridor. ; ; Gastrointestinal: Negative for nausea, vomiting, diarrhea, abdominal pain, blood in stool, hematemesis, jaundice and rectal bleeding. . ; ; Genitourinary: Negative for dysuria, flank pain and hematuria. ; ; Musculoskeletal: Negative for back pain and neck pain. Negative for swelling and trauma.; ; Skin: Negative for pruritus, rash, abrasions, blisters, bruising and skin lesion.; ; Neuro: Negative for headache, lightheadedness and neck stiffness. Negative for altered level of consciousness, altered mental status, extremity weakness, paresthesias, involuntary movement, seizure and syncope.       Physical Exam Updated Vital Signs BP (!) 155/67 (BP Location: Right Arm)   Pulse (!) 103   Temp 98.8 F (37.1 C) (Oral)   Resp 20   Ht 5\' 10"  (1.778 m)   Wt 73.9 kg   SpO2 95%   BMI 23.39 kg/m    20:04:38 Orthostatic Vital Signs HC  Orthostatic Lying   BP- Lying: 145/68  Pulse- Lying: 106      Orthostatic Sitting  BP- Sitting: 154/68  Pulse- Sitting: 107   Pt unable to stand for Orthostatic VS   Physical Exam 1951: Physical examination:  Nursing notes reviewed; Vital signs and O2 SAT reviewed;  Constitutional: Well developed, Well nourished, In no acute distress; Head:  Normocephalic, atraumatic; Eyes: EOMI, PERRL, No scleral icterus; ENMT: Mouth and pharynx normal, Mucous membranes dry; Neck: Supple, Full range of motion, No lymphadenopathy; Cardiovascular: Regular rate and rhythm, No gallop; Respiratory: Breath sounds coarse & equal bilaterally, scattered wheezes. No audible wheezing. Speaking full sentences, Normal respiratory effort/excursion; Chest: Nontender, Movement normal; Abdomen: Soft, Nontender, Nondistended, Normal bowel sounds; Genitourinary: No CVA tenderness; Extremities: Peripheral pulses normal, No tenderness, No edema, No calf edema or asymmetry.; Neuro: AA&Ox3, Major CN grossly intact. Speech  clear.  No facial droop. Grips equal. Strength 5/5 equal bilat UE's and LE's.  DTR 2/4 equal bilat UE's and LE's.  No gross sensory deficits.  Normal cerebellar testing bilat UE's (finger-nose) and LE's (heel-shin)..; Skin: Color normal, Warm, Dry.     ED Treatments / Results  Labs (all labs ordered are listed, but only abnormal results are displayed)   EKG EKG Interpretation  Date/Time:  Saturday January 06 2019 19:59:06 EST Ventricular Rate:  105 PR Interval:    QRS Duration: 84 QT Interval:  320 QTC Calculation: 423 R Axis:   76 Text Interpretation:  Sinus tachycardia Ventricular premature complex Minimal ST depression, lateral leads When compared with ECG of 09/25/2012 Nonspecific ST and T wave abnormality is now Present Confirmed by Francine Graven [phone]) on 01/06/2019 8:11:24 PM   Radiology   Procedures Procedures (including critical care time)  Medications Ordered in ED Medications  0.9 %  sodium chloride infusion ( Intravenous New Bag/Given 01/06/19 2025)  ipratropium-albuterol (DUONEB) 0.5-2.5 (3) MG/3ML nebulizer solution 3 mL (3 mLs Nebulization Given 01/06/19 2020)  sodium chloride 0.9 % bolus 250 mL (0 mLs Intravenous Stopped 01/06/19 2052)     Initial Impression / Assessment and Plan / ED Course  I have reviewed the triage vital signs and the nursing notes.  Pertinent labs & imaging results that were available during my care of the patient were reviewed by me and considered in my medical decision making (see chart for details).       MDM Reviewed: previous chart, nursing note and vitals Reviewed previous: labs and ECG Interpretation: labs, ECG, x-ray and CT scan   Results for orders placed or performed during the hospital encounter of 01/06/19  Group A Strep by PCR  Result Value Ref Range   Group A Strep by PCR NOT DETECTED NOT DETECTED  Influenza panel by PCR (type A & B)  Result Value Ref Range   Influenza A By PCR NEGATIVE NEGATIVE   Influenza B By  PCR NEGATIVE NEGATIVE  Urinalysis, Routine w reflex microscopic  Result Value Ref Range   Color, Urine YELLOW YELLOW   APPearance CLEAR CLEAR   Specific Gravity, Urine 1.020 1.005 - 1.030   pH 5.0 5.0 - 8.0   Glucose, UA NEGATIVE NEGATIVE mg/dL   Hgb urine dipstick NEGATIVE NEGATIVE   Bilirubin Urine NEGATIVE NEGATIVE   Ketones, ur NEGATIVE NEGATIVE mg/dL   Protein, ur NEGATIVE NEGATIVE mg/dL   Nitrite NEGATIVE NEGATIVE   Leukocytes,Ua NEGATIVE NEGATIVE  Comprehensive metabolic panel  Result Value Ref Range   Sodium 139 135 - 145 mmol/L   Potassium 5.9 (H) 3.5 - 5.1 mmol/L   Chloride 106 98 - 111 mmol/L   CO2 24 22 - 32 mmol/L   Glucose, Bld 139 (H) 70 - 99 mg/dL   BUN 26 (H) 8 - 23 mg/dL   Creatinine, Ser 1.17 0.61 - 1.24 mg/dL   Calcium 8.7 (L) 8.9 - 10.3 mg/dL   Total Protein 7.7 6.5 - 8.1 g/dL   Albumin 3.9 3.5 - 5.0 g/dL   AST 55 (H) 15 - 41 U/L   ALT 43 0 - 44 U/L   Alkaline Phosphatase 107 38 - 126 U/L   Total Bilirubin 2.0 (H) 0.3 - 1.2 mg/dL   GFR calc non Af Amer >60 >60 mL/min   GFR calc Af Amer >60 >60 mL/min   Anion gap 9 5 - 15  Troponin I - Once  Result Value Ref Range   Troponin I <0.03 <0.03 ng/mL  Lactic acid, plasma  Result Value Ref Range   Lactic Acid, Venous 1.6 0.5 - 1.9 mmol/L  CBC with Differential  Result Value Ref Range   WBC 11.8 (H) 4.0 - 10.5 K/uL   RBC 4.10 (L) 4.22 - 5.81 MIL/uL   Hemoglobin 11.3 (L) 13.0 - 17.0 g/dL   HCT 37.0 (L) 39.0 - 52.0 %   MCV 90.2 80.0 - 100.0 fL   MCH 27.6 26.0 - 34.0 pg   MCHC 30.5 30.0 - 36.0 g/dL   RDW 13.7 11.5 - 15.5 %   Platelets 219 150 - 400 K/uL   nRBC 0.0 0.0 - 0.2 %   Neutrophils Relative % 86 %   Neutro Abs 10.1 (H) 1.7 - 7.7 K/uL   Lymphocytes Relative 7 %   Lymphs Abs 0.8 0.7 - 4.0 K/uL   Monocytes Relative 6 %   Monocytes Absolute 0.7 0.1 - 1.0 K/uL   Eosinophils Relative 1 %   Eosinophils Absolute 0.1 0.0 - 0.5 K/uL   Basophils Relative 0 %  Basophils Absolute 0.0 0.0 - 0.1 K/uL    Immature Granulocytes 0 %   Abs Immature Granulocytes 0.03 0.00 - 0.07 K/uL   Dg Chest 2 View Result Date: 01/06/2019 CLINICAL DATA:  Cough EXAM: CHEST - 2 VIEW COMPARISON:  None. FINDINGS: Normal heart size. Normal mediastinal contour. No pneumothorax. No pleural effusion. Lungs appear clear, with no acute consolidative airspace disease and no pulmonary edema. IMPRESSION: No active cardiopulmonary disease. Electronically Signed   By: Ilona Sorrel M.D.   On: 01/06/2019 20:57   Ct Head Wo Contrast Result Date: 01/06/2019 CLINICAL DATA:  Generalized weakness, fever and cough. History of hypertension and diabetes. EXAM: CT HEAD WITHOUT CONTRAST TECHNIQUE: Contiguous axial images were obtained from the base of the skull through the vertex without intravenous contrast. COMPARISON:  None. FINDINGS: BRAIN: No intraparenchymal hemorrhage, mass effect nor midline shift. Moderate parenchymal brain volume loss, greater than expected for age. No hydrocephalus. No hydrocephalus. Patchy supratentorial white matter hypodensities less than expected for patient's age, though non-specific are most compatible with chronic small vessel ischemic disease. No acute large vascular territory infarcts. No abnormal extra-axial fluid collections. Basal cisterns are patent. VASCULAR: Moderate calcific atherosclerosis of the carotid siphons. SKULL: No skull fracture. No significant scalp soft tissue swelling. SINUSES/ORBITS: Mild paranasal sinus mucosal thickening. Unerupted LEFT maxillary molar. It has mastoid air cells are well aerated.The included ocular globes and orbital contents are non-suspicious. Status post bilateral ocular lens implants. OTHER: None. IMPRESSION: 1. No acute intracranial process. 2. Moderate parenchymal brain volume loss. Electronically Signed   By: Elon Alas M.D.   On: 01/06/2019 22:03    2215:  Pt unable to stand for orthostatics; this is not pt's baseline per family at bedside (pt ambulates  independently). Short neb given for wheezing. Pt appears clinically dehydrated; judicious IVF given. Concern regarding d/c pt with inability to stand. CT head without acute abnormality; may need MRI to r/o stroke. Neuro exam in ED remains intact/unchanged and without focal deficits. Dx and testing d/w pt and family.  Questions answered.  Verb understanding, agreeable to admit. T/C returned from [facility] Dr. Darrick Meigs, case discussed, including:  HPI, pertinent PM/SHx, VS/PE, dx testing, ED course and treatment:  Agreeable to admit.     Final Clinical Impressions(s) / ED Diagnoses   Final diagnoses:  None    ED Discharge Orders    None       Francine Graven, DO 01/10/19 1536

## 2019-01-07 ENCOUNTER — Encounter (HOSPITAL_COMMUNITY): Payer: Self-pay

## 2019-01-07 DIAGNOSIS — R531 Weakness: Secondary | ICD-10-CM | POA: Insufficient documentation

## 2019-01-07 DIAGNOSIS — I10 Essential (primary) hypertension: Secondary | ICD-10-CM | POA: Diagnosis not present

## 2019-01-07 DIAGNOSIS — I2510 Atherosclerotic heart disease of native coronary artery without angina pectoris: Secondary | ICD-10-CM | POA: Diagnosis not present

## 2019-01-07 DIAGNOSIS — E860 Dehydration: Secondary | ICD-10-CM | POA: Insufficient documentation

## 2019-01-07 DIAGNOSIS — R944 Abnormal results of kidney function studies: Secondary | ICD-10-CM | POA: Diagnosis not present

## 2019-01-07 DIAGNOSIS — R739 Hyperglycemia, unspecified: Secondary | ICD-10-CM | POA: Diagnosis not present

## 2019-01-07 DIAGNOSIS — E1151 Type 2 diabetes mellitus with diabetic peripheral angiopathy without gangrene: Secondary | ICD-10-CM | POA: Diagnosis not present

## 2019-01-07 DIAGNOSIS — J209 Acute bronchitis, unspecified: Secondary | ICD-10-CM | POA: Diagnosis not present

## 2019-01-07 DIAGNOSIS — R2681 Unsteadiness on feet: Secondary | ICD-10-CM | POA: Diagnosis not present

## 2019-01-07 DIAGNOSIS — K219 Gastro-esophageal reflux disease without esophagitis: Secondary | ICD-10-CM | POA: Diagnosis not present

## 2019-01-07 DIAGNOSIS — R2689 Other abnormalities of gait and mobility: Secondary | ICD-10-CM | POA: Diagnosis not present

## 2019-01-07 DIAGNOSIS — E785 Hyperlipidemia, unspecified: Secondary | ICD-10-CM | POA: Diagnosis not present

## 2019-01-07 LAB — COMPREHENSIVE METABOLIC PANEL
ALT: 29 U/L (ref 0–44)
AST: 21 U/L (ref 15–41)
Albumin: 3.1 g/dL — ABNORMAL LOW (ref 3.5–5.0)
Alkaline Phosphatase: 87 U/L (ref 38–126)
Anion gap: 8 (ref 5–15)
BUN: 24 mg/dL — ABNORMAL HIGH (ref 8–23)
CO2: 23 mmol/L (ref 22–32)
Calcium: 8.1 mg/dL — ABNORMAL LOW (ref 8.9–10.3)
Chloride: 108 mmol/L (ref 98–111)
Creatinine, Ser: 0.99 mg/dL (ref 0.61–1.24)
GFR calc Af Amer: >60 mL/min (ref >60)
GFR calc non Af Amer: >60 mL/min (ref >60)
Glucose, Bld: 158 mg/dL — ABNORMAL HIGH (ref 70–99)
POTASSIUM: 3.7 mmol/L (ref 3.5–5.1)
Sodium: 139 mmol/L (ref 135–145)
Total Bilirubin: 0.6 mg/dL (ref 0.3–1.2)
Total Protein: 6.3 g/dL — ABNORMAL LOW (ref 6.5–8.1)

## 2019-01-07 LAB — CBC
HCT: 30.8 % — ABNORMAL LOW (ref 39.0–52.0)
Hemoglobin: 9.5 g/dL — ABNORMAL LOW (ref 13.0–17.0)
MCH: 27.7 pg (ref 26.0–34.0)
MCHC: 30.8 g/dL (ref 30.0–36.0)
MCV: 89.8 fL (ref 80.0–100.0)
Platelets: 185 10*3/uL (ref 150–400)
RBC: 3.43 MIL/uL — ABNORMAL LOW (ref 4.22–5.81)
RDW: 13.9 % (ref 11.5–15.5)
WBC: 9.3 10*3/uL (ref 4.0–10.5)
nRBC: 0 % (ref 0.0–0.2)

## 2019-01-07 LAB — CBG MONITORING, ED
Glucose-Capillary: 136 mg/dL — ABNORMAL HIGH (ref 70–99)
Glucose-Capillary: 161 mg/dL — ABNORMAL HIGH (ref 70–99)

## 2019-01-07 LAB — HEMOGLOBIN A1C
Hgb A1c MFr Bld: 6.7 % — ABNORMAL HIGH (ref 4.8–5.6)
MEAN PLASMA GLUCOSE: 145.59 mg/dL

## 2019-01-07 MED ORDER — BENZONATATE 100 MG PO CAPS: 100.0000 mg | ORAL_CAPSULE | Freq: Three times a day (TID) | ORAL | Status: DC | PRN

## 2019-01-07 MED ORDER — ATORVASTATIN CALCIUM 40 MG PO TABS: 40.0000 mg | ORAL_TABLET | Freq: Every day | ORAL | Status: DC

## 2019-01-07 MED ORDER — DOXYCYCLINE HYCLATE 100 MG PO TABS: 100.0000 mg | ORAL_TABLET | Freq: Two times a day (BID) | ORAL | 0 refills | Status: AC

## 2019-01-07 NOTE — ED Notes (Signed)
Pt pulled up in bed, warm blanket given, pt placed on 2L oxygen via [place] due to O2 sats being 88% with good waveform, pt does breath with mouth open while sleeping.

## 2019-01-07 NOTE — Evaluation (Signed)
Physical Therapy Evaluation Patient Details Name: Walter [name] MRN: [id number] DOB: 1945-11-10 Today's Date: 01/07/2019   History of Present Illness  Patient is a 73 year old male admitted to ER 01/06/2019 with diagnosis acute bronchitis c/o generalized weakness and fatigue for 1 week. PMH: T2DM, GERD, HTN, PVD, CAD w/ stent.    Clinical Impression  Patient evaluated by Physical Therapy with no further acute PT needs identified. All education has been completed and the patient has no further questions. Patient presents lying in bed with wife present during evaluation. Patient at 97% on [place] 2 LPM O2. Patient able to complete bed mobility modified independent. Patient required supervision for transfers and ambulation without assistive device for safety. Patient exhibited short shuffling steps when conversing with head turns during ambulation and when turning corners in an unfamiliar environment. Patient exhibits significant forward head and rounded shoulders posturing. Patient and wife report patient continued to drive and perform yard work prior to getting ill. Patient is currently at a higher risk for falls.  See below for any follow-up Physical Therapy or equipment needs. PT is signing off. Thank you for this referral.    Follow Up Recommendations Outpatient PT;Supervision for mobility/OOB    Equipment Recommendations    None at this time.   Recommendations for Other Services   None at this time.    Precautions / Restrictions Precautions Precautions: None Restrictions Weight Bearing Restrictions: No      Mobility  Bed Mobility Overal bed mobility: Modified Independent             General bed mobility comments: increased time  Transfers Overall transfer level: Needs assistance Equipment used: None Transfers: Sit to/from Stand;Stand Pivot Transfers Sit to Stand: Supervision Stand pivot transfers: Supervision          Ambulation/Gait Ambulation/Gait assistance:  Supervision Gait Distance (Feet): 170 Feet Assistive device: None Gait Pattern/deviations: Step-through pattern;Decreased step length - right;Decreased step length - left;Decreased stride length;Shuffle;Trunk flexed;Wide base of support(abducted left and right feet)        Stairs            Wheelchair Mobility    Modified Rankin (Stroke Patients Only)       Balance Overall balance assessment: Mild deficits observed, not formally tested                                           Pertinent Vitals/Pain Pain Assessment: No/denies pain    Home Living Family/patient expects to be discharged to:: Private residence Living Arrangements: Spouse/significant other Available Help at Discharge: Family;Available PRN/intermittently Type of Home: House Home Access: Level entry     Home Layout: Able to live on main level with bedroom/bathroom(There are 2 steps to enter kitchen from rest of house.) Home Equipment: Grab bars - tub/shower      Prior Function Level of Independence: Independent         Comments: drive, mows yard     Hand Dominance   Dominant Hand: Left    Extremity/Trunk Assessment   Upper Extremity Assessment Upper Extremity Assessment: Overall WFL for tasks assessed    Lower Extremity Assessment Lower Extremity Assessment: Overall WFL for tasks assessed    Cervical / Trunk Assessment Cervical / Trunk Assessment: Kyphotic  Communication   Communication: No difficulties  Cognition Arousal/Alertness: Awake/alert Behavior During Therapy: WFL for tasks assessed/performed Overall Cognitive Status: Within Functional Limits  for tasks assessed                                        General Comments      Exercises     Assessment/Plan    PT Assessment All further PT needs can be met in the next venue of care  PT Problem List Decreased balance;Decreased mobility;Decreased activity tolerance       PT Treatment  Interventions      PT Goals (Current goals can be found in the Care Plan section)  Acute Rehab PT Goals Patient Stated Goal: Go home and return to prior activities PT Goal Formulation: With patient/family Time For Goal Achievement: 01/14/19 Potential to Achieve Goals: Good    Frequency     Barriers to discharge        Co-evaluation               AM-PAC PT "6 Clicks" Mobility  Outcome Measure Help needed turning from your back to your side while in a flat bed without using bedrails?: None Help needed moving from lying on your back to sitting on the side of a flat bed without using bedrails?: None Help needed moving to and from a bed to a chair (including a wheelchair)?: A Little Help needed standing up from a chair using your arms (e.g., wheelchair or bedside chair)?: A Little Help needed to walk in hospital room?: A Little Help needed climbing 3-5 steps with a railing? : A Little 6 Click Score: 20    End of Session Equipment Utilized During Treatment: Gait belt Activity Tolerance: Patient tolerated treatment well Patient left: in bed;with family/visitor present;with call bell/phone within reach Nurse Communication: Mobility status PT Visit Diagnosis: Unsteadiness on feet (R26.81);Other abnormalities of gait and mobility (R26.89)    Time: 1120-1150 PT Time Calculation (min) (ACUTE ONLY): 30 min   Charges:   PT Evaluation $PT Eval Low Complexity: 1 Low PT Treatments $Gait Training: 8-22 mins        Floria Raveling. Hartnett-Rands, MS, PT Per [place] #[id number] 01/07/2019, 12:11 PM

## 2019-01-07 NOTE — Discharge Summary (Signed)
Physician Discharge Summary  Walter [name] SAY:[id number] DOB: 06-17-1946 DOA: 01/06/2019  PCP: Brunetta Jeans, PA-C  Admit date: 01/06/2019 Discharge date: 01/07/2019  Time spent: 30 minutes  Recommendations for Outpatient Follow-up:  1. Repeat basic metabolic panel to follow electrolytes and renal function 2. Reassess patient symptoms to evaluate complete resolution of acute bronchitis. 3. Patient has been instructed to follow-up outpatient physical therapy; should patient has been compliant and if needed arrange for home health services.   Discharge Diagnoses:  Active Problems:   Acute bronchitis with bronchospasm Generalized weakness Essential hypertension Type 2 diabetes mellitus Gastroesophageal reflux disease Prior history of coronary artery disease (status post stent placement in 2003)  Discharge Condition: Stable and improved.  Discharged home with instruction to follow-up with PCP.  Patient will also follow-up with outpatient physical therapy services.  Diet recommendation: Heart healthy diet.  Filed Weights   01/06/19 1931  Weight: 73.9 kg    History of present illness:  As per H&P written by Dr. Darrick Meigs on 01/06/2019 73 y.o. male, with history of CAD status post stent placement in 2003, diabetes mellitus type 2, hypertension, GERD, peripheral vascular disease who came to hospital with complaints of generalized weakness and fatigue for past 1 week.  As per patient wife he has been congested and has been having coughing with yellow-colored phlegm and runny nose.  He was recently seen by PMD and was prescribed Tessalon Perles.  He has been taking Tessalon Perles 3 times a day.  Wife has noted that patient has been getting weaker and having difficulty walking.  Today he was not able to stand and fell back into a chair at home. He denies nausea vomiting or diarrhea. Denies chest pain or shortness of breath. Complains of cough with yellow phlegm. He does have history of  allergies/sinusitis Denies headache. Denies abdominal pain or dysuria  In the ED CT head was negative, chest x-ray showed no acute infiltrate; but demonstrated bronchitic changes.  Lab work was unremarkable, except for mild hemoconcentration suggesting dehydration.  Hospital Course:  1-acute bronchitis -Patient started on doxycycline 100 mg twice a day -Continue to use as needed Tessalon to control severe coughing spells -Patient advised to keep himself well-hydrated and to continue PRN albuterol. -Outpatient follow-up with PCP in 10 days. -Continue the use of Tylenol as part of supportive care and comfort.  2-essential hypertension -Blood pressure stable -Continue home antihypertensive regimen -Heart healthy diet discussed.  3-generalized weakness/fatigue -Most likely associated with mild dehydration, poor oral intake and acute bronchitis. -Significantly improved after fluid resuscitation -Patient seen by physical therapy service who recommended outpatient physical therapy treatment. -Case discussed with patient and wife who were in agreement with plan.  4-type 2 diabetes mellitus -Resume home oral hypoglycemic agents -Modified carbohydrate diet discussed with patient -Outpatient follow-up with PCP to further adjust hypoglycemic regimen as needed.  5-history of coronary artery disease -No chest pain -Continue aspirin, Lipitor and ARB.  6-hyperlipidemia -Continue statins  7-mild dehydration -Improved/resolved with fluid resuscitation -Patient instructed to maintain adequate hydration.  Procedures:  See below for x-ray reports  Consultations:  None  Discharge Exam: Vitals:   01/07/19 1345 01/07/19 1415  BP:    Pulse: 91 92  Resp: 18 17  Temp:    SpO2: 94% 94%    General: Afebrile, reports no chest pain, no nausea, no vomiting.  Still with intermittent episode of coughing spells; but overall feeling much better and looking to be discharged  home. Cardiovascular: S1 and S2, no  rubs, no gallops, no JVD. Respiratory: Positive for scattered rhonchi, no wheezing, normal respiratory effort.  Patient was not using accessory muscle and had good oxygen saturation on room air. Abdomen: Soft, nontender, nondistended, positive bowel sounds Extremities: No edema, no cyanosis, no clubbing.  Discharge Instructions   Discharge Instructions    Diet - low sodium heart healthy   Complete by:  As directed    Discharge instructions   Complete by:  As directed    Maintain adequate hydration Take medications as prescribed Follow-up with PCP in 1 week Follow heart healthy diet. Make sure to follow with physical therapy as an outpatient for further conditioning.     Allergies as of 01/07/2019   No Known Allergies     Medication List    STOP taking these medications   guaiFENesin 600 MG 12 hr tablet Commonly known as:  MUCINEX     TAKE these medications   amLODipine 5 MG tablet Commonly known as:  NORVASC Take 1 tablet (5 mg total) by mouth daily.   aspirin 81 MG tablet Take 81 mg by mouth daily.   atorvastatin 40 MG tablet Commonly known as:  LIPITOR Take 1 tablet (40 mg total) by mouth daily.   benzonatate 100 MG capsule Commonly known as:  TESSALON Take 1 capsule (100 mg total) by mouth 3 (three) times daily as needed (Severe coughing spells). What changed:    when to take this  reasons to take this   cimetidine 400 MG tablet Commonly known as:  TAGAMET Take 1 tablet (400 mg total) by mouth at bedtime.   doxycycline 100 MG tablet Commonly known as:  VIBRA-TABS Take 1 tablet (100 mg total) by mouth every 12 (twelve) hours for 7 days.   fluticasone 50 MCG/ACT nasal spray Commonly known as:  FLONASE Place 2 sprays into both nostrils daily.   losartan 100 MG tablet Commonly known as:  COZAAR Take 1 tablet (100 mg total) by mouth daily.   metFORMIN 1000 MG tablet Commonly known as:  GLUCOPHAGE TAKE 1 TABLET BY  MOUTH TWICE DAILY WITH FOOD   ROLAIDS PO Take by mouth as needed.   vitamin B-12 1000 MCG tablet Commonly known as:  CYANOCOBALAMIN Take 1,000 mcg by mouth daily.      No Known Allergies Follow-up Information    Brunetta Jeans, PA-C. Schedule an appointment as soon as possible for a visit in 1 week(s).   Specialty:  Family Medicine Contact information: [address] [place] [address] [phone]           The results of significant diagnostics from this hospitalization (including imaging, microbiology, ancillary and laboratory) are listed below for reference.    Significant Diagnostic Studies: Dg Chest 2 View  Result Date: 01/06/2019 CLINICAL DATA:  Cough EXAM: CHEST - 2 VIEW COMPARISON:  None. FINDINGS: Normal heart size. Normal mediastinal contour. No pneumothorax. No pleural effusion. Lungs appear clear, with no acute consolidative airspace disease and no pulmonary edema. IMPRESSION: No active cardiopulmonary disease. Electronically Signed   By: Ilona Sorrel M.D.   On: 01/06/2019 20:57   Ct Head Wo Contrast  Result Date: 01/06/2019 CLINICAL DATA:  Generalized weakness, fever and cough. History of hypertension and diabetes. EXAM: CT HEAD WITHOUT CONTRAST TECHNIQUE: Contiguous axial images were obtained from the base of the skull through the vertex without intravenous contrast. COMPARISON:  None. FINDINGS: BRAIN: No intraparenchymal hemorrhage, mass effect nor midline shift. Moderate parenchymal brain volume loss, greater than expected  for age. No hydrocephalus. No hydrocephalus. Patchy supratentorial white matter hypodensities less than expected for patient's age, though non-specific are most compatible with chronic small vessel ischemic disease. No acute large vascular territory infarcts. No abnormal extra-axial fluid collections. Basal cisterns are patent. VASCULAR: Moderate calcific atherosclerosis of the carotid siphons. SKULL: No skull fracture. No significant  scalp soft tissue swelling. SINUSES/ORBITS: Mild paranasal sinus mucosal thickening. Unerupted LEFT maxillary molar. It has mastoid air cells are well aerated.The included ocular globes and orbital contents are non-suspicious. Status post bilateral ocular lens implants. OTHER: None. IMPRESSION: 1. No acute intracranial process. 2. Moderate parenchymal brain volume loss. Electronically Signed   By: Elon Alas M.D.   On: 01/06/2019 22:03    Microbiology: Recent Results (from the past 240 hour(s))  Group A Strep by PCR     Status: None   Collection Time: 01/06/19  7:54 PM  Result Value Ref Range Status   Group A Strep by PCR NOT DETECTED NOT DETECTED Final    Comment: Performed at [facility], [address] [place], [place] [address]     Labs: Basic Metabolic Panel: Recent Labs  Lab 01/06/19 1955 01/07/19 0751  NA 139 139  K 5.9* 3.7  CL 106 108  CO2 24 23  GLUCOSE 139* 158*  BUN 26* 24*  CREATININE 1.17 0.99  CALCIUM 8.7* 8.1*   Liver Function Tests: Recent Labs  Lab 01/06/19 1955 01/07/19 0751  AST 55* 21  ALT 43 29  ALKPHOS 107 87  BILITOT 2.0* 0.6  PROT 7.7 6.3*  ALBUMIN 3.9 3.1*   CBC: Recent Labs  Lab 01/06/19 1955 01/07/19 0751  WBC 11.8* 9.3  NEUTROABS 10.1*  --   HGB 11.3* 9.5*  HCT 37.0* 30.8*  MCV 90.2 89.8  PLT 219 185   Cardiac Enzymes: Recent Labs  Lab 01/06/19 1955  TROPONINI <0.03   CBG: Recent Labs  Lab 01/07/19 0841 01/07/19 1205  GLUCAP 136* 161*    Signed:  Barton Dubois MD.  [facility] 01/07/2019, 4:43 PM

## 2019-01-07 NOTE — ED Notes (Signed)
Barnett Applebaum, RN, [facility] notified of need for hospital bed as this pt will be holding in ED

## 2019-01-07 NOTE — ED Notes (Signed)
Dr. Dyann Kief seen pt and plan for discharge to home

## 2019-01-07 NOTE — ED Notes (Signed)
Given breakfast tray. 

## 2019-01-07 NOTE — ED Notes (Addendum)
While giving report on unit 300, CN went in to notify pt and pt's wife about room assignment, reported that wife refused for pt to go upstairs to unit 300. While giving report, notified receiving nurse that wife refused.

## 2019-01-08 LAB — URINE CULTURE: Culture: <10000 — AB

## 2019-01-15 ENCOUNTER — Encounter: Payer: Self-pay | Admitting: Physician Assistant

## 2019-01-15 ENCOUNTER — Other Ambulatory Visit: Payer: Self-pay

## 2019-01-15 ENCOUNTER — Ambulatory Visit (INDEPENDENT_AMBULATORY_CARE_PROVIDER_SITE_OTHER): Payer: Medicare HMO | Admitting: Physician Assistant

## 2019-01-15 VITALS — BP 128/70 | HR 78 | Temp 98.3°F | Resp 14 | Ht 69.0 in | Wt 157.0 lb

## 2019-01-15 DIAGNOSIS — J208 Acute bronchitis due to other specified organisms: Secondary | ICD-10-CM | POA: Diagnosis not present

## 2019-01-15 DIAGNOSIS — R2681 Unsteadiness on feet: Secondary | ICD-10-CM | POA: Diagnosis not present

## 2019-01-15 DIAGNOSIS — B9689 Other specified bacterial agents as the cause of diseases classified elsewhere: Secondary | ICD-10-CM | POA: Diagnosis not present

## 2019-01-15 LAB — BASIC METABOLIC PANEL
BUN: 20 mg/dL (ref 6–23)
CO2: 27 meq/L (ref 19–32)
Calcium: 9.9 mg/dL (ref 8.4–10.5)
Chloride: 102 mEq/L (ref 96–112)
Creatinine, Ser: 1.19 mg/dL (ref 0.40–1.50)
GFR: 59.81 mL/min — ABNORMAL LOW (ref >60.00)
Glucose, Bld: 67 mg/dL — ABNORMAL LOW (ref 70–99)
Potassium: 4.2 mEq/L (ref 3.5–5.1)
Sodium: 140 mEq/L (ref 135–145)

## 2019-01-15 MED ORDER — HYDROCOD POLST-CPM POLST ER 10-8 MG/5ML PO SUER: 5.0000 mL | Freq: Two times a day (BID) | ORAL | 0 refills | Status: DC | PRN

## 2019-01-15 NOTE — Progress Notes (Signed)
Patient presents to clinic today for ER follow-up of acute bacterial bronchitis. Patient presented to ER on 01/06/2019 from home with chest congestion, cough, fatigue and weakness.  ER workup clinic included labs (mildly low calcium at 8.1), CXR (Negative for pneumonia) and CT Head (negative for  Any acute intracranial pathology). Patient was started on Doxycycline and anti-tussives and discharged home to follow-up here.   Since discharge, patient endorses completing entire course of his antibiotic. Is feeling much better. Notes dry, residual cough. Denies fever, chills, malaise, chest congestion. Denies any new or worsening symptoms. Has been hydrating and eating well. Notes regular bowel and bladder output. Denies new concerns today. Is in need for referral back to OP PT for his chronic gait instability. They have a provider in [place] they would like to see.   Past Medical History:  Diagnosis Date  . Coronary artery disease   . Diabetes mellitus   . Diverticulosis   . Esophageal stricture   . GERD (gastroesophageal reflux disease)   . Hypertension   . Peripheral vascular disease ([place])     Current Outpatient Medications on File Prior to Visit  Medication Sig Dispense Refill  . amLODipine (NORVASC) 5 MG tablet Take 1 tablet (5 mg total) by mouth daily. 90 tablet 2  . aspirin 81 MG tablet Take 81 mg by mouth daily.      Marland Kitchen atorvastatin (LIPITOR) 40 MG tablet Take 1 tablet (40 mg total) by mouth daily. 90 tablet 1  . cimetidine (TAGAMET) 400 MG tablet Take 1 tablet (400 mg total) by mouth at bedtime. 90 tablet 4  . Dihydroxyaluminum Sod Carb (ROLAIDS PO) Take by mouth as needed.    . fluticasone (FLONASE) 50 MCG/ACT nasal spray Place 2 sprays into both nostrils daily. 16 g 6  . losartan (COZAAR) 100 MG tablet Take 1 tablet (100 mg total) by mouth daily. 90 tablet 3  . metFORMIN (GLUCOPHAGE) 1000 MG tablet TAKE 1 TABLET BY MOUTH TWICE DAILY WITH FOOD 180 tablet 1  . vitamin B-12 (CYANOCOBALAMIN)  1000 MCG tablet Take 1,000 mcg by mouth daily.     No current facility-administered medications on file prior to visit.     No Known Allergies  Family History  Problem Relation Age of Onset  . Breast cancer Mother   . Healthy Son   . Healthy Son   . Colon cancer Neg Hx     Social History   Socioeconomic History  . Marital status: Married    Spouse name: Not on file  . Number of children: 3  . Years of education: Not on file  . Highest education level: Not on file  Occupational History  . Occupation: Holiday representative  . Financial resource strain: Not on file  . Food insecurity:    Worry: Not on file    Inability: Not on file  . Transportation needs:    Medical: Not on file    Non-medical: Not on file  Tobacco Use  . Smoking status: Former Research scientist (life sciences)  . Smokeless tobacco: Never Used  Substance and Sexual Activity  . Alcohol use: No  . Drug use: No  . Sexual activity: Not on file  Lifestyle  . Physical activity:    Days per week: Not on file    Minutes per session: Not on file  . Stress: Not on file  Relationships  . Social connections:    Talks on phone: Not on file    Gets together: Not on file  Attends religious service: Not on file    Active member of club or organization: Not on file    Attends meetings of clubs or organizations: Not on file    Relationship status: Not on file  Other Topics Concern  . Not on file  Social History Narrative   Daily caffeine     Review of Systems - See HPI.  All other ROS are negative.  BP 128/70   Pulse 78   Temp 98.3 F (36.8 C) (Oral)   Resp 14   Ht 5\' 9"  (1.753 m)   Wt 157 lb (71.2 kg)   SpO2 98%   BMI 23.18 kg/m   Physical Exam Vitals signs reviewed.  Constitutional:      Appearance: Normal appearance.  HENT:     Head: Normocephalic and atraumatic.     Right Ear: Tympanic membrane normal.     Left Ear: Tympanic membrane normal.     Nose: Nose normal.     Mouth/Throat:     Mouth: Mucous membranes  are moist.  Eyes:     Conjunctiva/sclera: Conjunctivae normal.  Neck:     Musculoskeletal: Neck supple.  Cardiovascular:     Rate and Rhythm: Normal rate and regular rhythm.     Heart sounds: Normal heart sounds.  Pulmonary:     Effort: Pulmonary effort is normal.     Breath sounds: Normal breath sounds.  Neurological:     General: No focal deficit present.     Mental Status: He is alert and oriented to person, place, and time.  Psychiatric:        Mood and Affect: Mood normal.     Recent Results (from the past 2160 hour(s))  Group A Strep by PCR     Status: None   Collection Time: 01/06/19  7:54 PM  Result Value Ref Range   Group A Strep by PCR NOT DETECTED NOT DETECTED    Comment: Performed at [facility], [address], [place], [place] [address]  Influenza panel by PCR (type A & B)     Status: None   Collection Time: 01/06/19  7:54 PM  Result Value Ref Range   Influenza A By PCR NEGATIVE NEGATIVE   Influenza B By PCR NEGATIVE NEGATIVE    Comment: (NOTE) The Xpert Xpress Flu assay is intended as an aid in the diagnosis of  influenza and should not be used as a sole basis for treatment.  This  assay is FDA approved for nasopharyngeal swab specimens only. Nasal  washings and aspirates are unacceptable for Xpert Xpress Flu testing. Performed at [facility], [address], [place], [place] [address]   Urinalysis, Routine w reflex microscopic     Status: None   Collection Time: 01/06/19  7:55 PM  Result Value Ref Range   Color, Urine YELLOW YELLOW   APPearance CLEAR CLEAR   Specific Gravity, Urine 1.020 1.005 - 1.030   pH 5.0 5.0 - 8.0   Glucose, UA NEGATIVE NEGATIVE mg/dL   Hgb urine dipstick NEGATIVE NEGATIVE   Bilirubin Urine NEGATIVE NEGATIVE   Ketones, ur NEGATIVE NEGATIVE mg/dL   Protein, ur NEGATIVE NEGATIVE mg/dL   Nitrite NEGATIVE NEGATIVE   Leukocytes,Ua NEGATIVE NEGATIVE    Comment: Performed at [facility], [address], [place], [place] [address]   Urine culture     Status: Abnormal   Collection Time: 01/06/19  7:55 PM  Result Value Ref Range   Specimen Description      URINE,  CLEAN CATCH Performed at [facility], [address] [place], [place] [address]    Special Requests      NONE Performed at [facility], [address] [place], [place] [address]    Culture (A)     <10,000 COLONIES/mL INSIGNIFICANT GROWTH Performed at [facility] [address], [place], [place] [address]    Report Status 01/08/2019 FINAL   Comprehensive metabolic panel     Status: Abnormal   Collection Time: 01/06/19  7:55 PM  Result Value Ref Range   Sodium 139 135 - 145 mmol/L   Potassium 5.9 (H) 3.5 - 5.1 mmol/L   Chloride 106 98 - 111 mmol/L   CO2 24 22 - 32 mmol/L   Glucose, Bld 139 (H) 70 - 99 mg/dL   BUN 26 (H) 8 - 23 mg/dL   Creatinine, Ser 1.17 0.61 - 1.24 mg/dL   Calcium 8.7 (L) 8.9 - 10.3 mg/dL   Total Protein 7.7 6.5 - 8.1 g/dL   Albumin 3.9 3.5 - 5.0 g/dL   AST 55 (H) 15 - 41 U/L   ALT 43 0 - 44 U/L   Alkaline Phosphatase 107 38 - 126 U/L   Total Bilirubin 2.0 (H) 0.3 - 1.2 mg/dL   GFR calc non Af Amer >60 >60 mL/min   GFR calc Af Amer >60 >60 mL/min   Anion gap 9 5 - 15    Comment: Performed at [facility], [address], [place], [place] [address]  Troponin I - Once     Status: None   Collection Time: 01/06/19  7:55 PM  Result Value Ref Range   Troponin I <0.03 <0.03 ng/mL    Comment: Performed at [facility], [address], [place], [place] [address]  Lactic acid, plasma     Status: None   Collection Time: 01/06/19  7:55 PM  Result Value Ref Range   Lactic Acid, Venous 1.6 0.5 - 1.9 mmol/L    Comment: Performed at [facility], [address], [place],  [address]  CBC with Differential     Status: Abnormal   Collection Time: 01/06/19  7:55 PM  Result Value Ref Range   WBC 11.8 (H) 4.0 - 10.5 K/uL   RBC 4.10 (L) 4.22 - 5.81 MIL/uL   Hemoglobin 11.3 (L) 13.0 - 17.0 g/dL   HCT 37.0 (L) 39.0 - 52.0 %    MCV 90.2 80.0 - 100.0 fL   MCH 27.6 26.0 - 34.0 pg   MCHC 30.5 30.0 - 36.0 g/dL   RDW 13.7 11.5 - 15.5 %   Platelets 219 150 - 400 K/uL   nRBC 0.0 0.0 - 0.2 %   Neutrophils Relative % 86 %   Neutro Abs 10.1 (H) 1.7 - 7.7 K/uL   Lymphocytes Relative 7 %   Lymphs Abs 0.8 0.7 - 4.0 K/uL   Monocytes Relative 6 %   Monocytes Absolute 0.7 0.1 - 1.0 K/uL   Eosinophils Relative 1 %   Eosinophils Absolute 0.1 0.0 - 0.5 K/uL   Basophils Relative 0 %   Basophils Absolute 0.0 0.0 - 0.1 K/uL   Immature Granulocytes 0 %   Abs Immature Granulocytes 0.03 0.00 - 0.07 K/uL    Comment: Performed at [facility], [address] [place], [place] [address]  Lactic acid, plasma     Status: None   Collection Time: 01/06/19 10:50 PM  Result Value Ref Range   Lactic Acid, Venous 1.4 0.5 - 1.9 mmol/L  Comment: Performed at [facility], [address] [place], [place] [address]  Hemoglobin A1c     Status: Abnormal   Collection Time: 01/06/19 10:50 PM  Result Value Ref Range   Hgb A1c MFr Bld 6.7 (H) 4.8 - 5.6 %    Comment: (NOTE) Pre diabetes:          5.7%-6.4% Diabetes:              >6.4% Glycemic control for   <7.0% adults with diabetes    Mean Plasma Glucose 145.59 mg/dL    Comment: Performed at [facility] [address], [place], [place] [address]  CBC     Status: Abnormal   Collection Time: 01/07/19  7:51 AM  Result Value Ref Range   WBC 9.3 4.0 - 10.5 K/uL   RBC 3.43 (L) 4.22 - 5.81 MIL/uL   Hemoglobin 9.5 (L) 13.0 - 17.0 g/dL   HCT 30.8 (L) 39.0 - 52.0 %   MCV 89.8 80.0 - 100.0 fL   MCH 27.7 26.0 - 34.0 pg   MCHC 30.8 30.0 - 36.0 g/dL   RDW 13.9 11.5 - 15.5 %   Platelets 185 150 - 400 K/uL   nRBC 0.0 0.0 - 0.2 %    Comment: Performed at [facility], [address] [place], [place] [address]  Comprehensive metabolic panel     Status: Abnormal   Collection Time: 01/07/19  7:51 AM  Result Value Ref Range   Sodium 139 135 - 145 mmol/L   Potassium 3.7 3.5 - 5.1 mmol/L     Comment: DELTA CHECK NOTED   Chloride 108 98 - 111 mmol/L   CO2 23 22 - 32 mmol/L   Glucose, Bld 158 (H) 70 - 99 mg/dL   BUN 24 (H) 8 - 23 mg/dL   Creatinine, Ser 0.99 0.61 - 1.24 mg/dL   Calcium 8.1 (L) 8.9 - 10.3 mg/dL   Total Protein 6.3 (L) 6.5 - 8.1 g/dL   Albumin 3.1 (L) 3.5 - 5.0 g/dL   AST 21 15 - 41 U/L   ALT 29 0 - 44 U/L   Alkaline Phosphatase 87 38 - 126 U/L   Total Bilirubin 0.6 0.3 - 1.2 mg/dL   GFR calc non Af Amer >60 >60 mL/min   GFR calc Af Amer >60 >60 mL/min   Anion gap 8 5 - 15    Comment: Performed at [facility], [address], [place], [place] [address]  CBG monitoring, ED     Status: Abnormal   Collection Time: 01/07/19  8:41 AM  Result Value Ref Range   Glucose-Capillary 136 (H) 70 - 99 mg/dL  CBG monitoring, ED     Status: Abnormal   Collection Time: 01/07/19 12:05 PM  Result Value Ref Range   Glucose-Capillary 161 (H) 70 - 99 mg/dL   Assessment/Plan: 1. Acute bacterial bronchitis Clinically resolving. Vitals stable. Lungs CTAB. Supportive measures and OTC medications reviewed. Given cough syrup to use as directed if needed for residual cough. Repeat BMP today. - Basic metabolic panel  2. Gait instability Referral to PT placed. - Ambulatory referral to Physical Therapy   Leeanne Rio, PA-C

## 2019-01-15 NOTE — Patient Instructions (Signed)
I am glad you are doing so well! Continue the Mucinex as directed, keeping hydrated.  Use the Tussionex as directed if needed for coughing spell. DO NOT TAKE MORE THAN AS DIRECTED.  Please go to the lab today for blood work.  I will call you with your results. We will alter treatment regimen(s) if indicated by your results.   Follow-up immediately for any recurring symptoms.

## 2019-01-22 DIAGNOSIS — R531 Weakness: Secondary | ICD-10-CM | POA: Diagnosis not present

## 2019-01-22 DIAGNOSIS — R262 Difficulty in walking, not elsewhere classified: Secondary | ICD-10-CM | POA: Diagnosis not present

## 2019-01-24 ENCOUNTER — Telehealth: Payer: Self-pay | Admitting: Physician Assistant

## 2019-01-24 DIAGNOSIS — E1151 Type 2 diabetes mellitus with diabetic peripheral angiopathy without gangrene: Secondary | ICD-10-CM

## 2019-01-24 MED ORDER — METFORMIN HCL 1000 MG PO TABS: 1000.0000 mg | ORAL_TABLET | Freq: Two times a day (BID) | ORAL | 1 refills | Status: DC

## 2019-01-24 NOTE — Telephone Encounter (Signed)
Copied from [place] [id number]. Topic: Quick Communication - Rx Refill/Question >> Jan 24, 2019 10:34 AM Richardo Priest, NT wrote: Medication:  metFORMIN (GLUCOPHAGE) 1000 MG tablet  Has the patient contacted their pharmacy? Yes, patient states no more refills, and is almost empty.   Preferred Pharmacy (with phone number or street name):  [facility] [address] [place] [phone] (Phone) [phone] (Fax)  Agent: Please be advised that RX refills may take up to 3 business days. We ask that you follow-up with your pharmacy.

## 2019-01-24 NOTE — Telephone Encounter (Signed)
Rx sent to the pharmacy for patient. Will reach back out to patient about a 3 month follow up per telephone and labs in the future.

## 2019-01-24 NOTE — Telephone Encounter (Signed)
Pt is aware Rx called into pharmacy and pt has scheduled cpe on 04/26/2019

## 2019-01-25 DIAGNOSIS — R262 Difficulty in walking, not elsewhere classified: Secondary | ICD-10-CM | POA: Diagnosis not present

## 2019-01-25 DIAGNOSIS — R531 Weakness: Secondary | ICD-10-CM | POA: Diagnosis not present

## 2019-02-06 ENCOUNTER — Other Ambulatory Visit: Payer: Self-pay | Admitting: Physician Assistant

## 2019-02-06 MED ORDER — LOSARTAN POTASSIUM 100 MG PO TABS: 100.0000 mg | ORAL_TABLET | Freq: Every day | ORAL | 3 refills | Status: DC

## 2019-02-06 NOTE — Telephone Encounter (Signed)
Requested Prescriptions  Pending Prescriptions Disp Refills  . losartan (COZAAR) 100 MG tablet 90 tablet 3    Sig: Take 1 tablet (100 mg total) by mouth daily.     Cardiovascular:  Angiotensin Receptor Blockers Passed - 02/06/2019  9:46 AM      Passed - Cr in normal range and within 180 days    Creatinine, Ser  Date Value Ref Range Status  01/15/2019 1.19 0.40 - 1.50 mg/dL Final         Passed - K in normal range and within 180 days    Potassium  Date Value Ref Range Status  01/15/2019 4.2 3.5 - 5.1 mEq/L Final         Passed - Patient is not pregnant      Passed - Last BP in normal range    BP Readings from Last 1 Encounters:  01/15/19 128/70         Passed - Valid encounter within last 6 months    Recent Outpatient Visits          3 weeks ago Acute bacterial bronchitis   [facility] [place], [place], [place]   1 month ago Acute upper respiratory infection, unspecified   [facility], Gregary Signs, FNP   5 months ago B12 deficiency   [facility] [place], [place], [place]   6 months ago Diabetes mellitus with peripheral circulatory disorder [facility])   [facility] [place], [place], [place]   7 months ago Diabetes mellitus with peripheral circulatory disorder ([place])   Therapist, music at [employer], Doretha Sou, MD      Future Appointments            In 2 months Brunetta Jeans, PA-C [facility] [place], [facility]

## 2019-02-12 ENCOUNTER — Encounter: Payer: Medicare HMO | Admitting: Physician Assistant

## 2019-02-14 ENCOUNTER — Other Ambulatory Visit: Payer: Self-pay | Admitting: Physician Assistant

## 2019-04-18 ENCOUNTER — Encounter: Payer: Medicare HMO | Admitting: Physician Assistant

## 2019-04-19 ENCOUNTER — Ambulatory Visit (INDEPENDENT_AMBULATORY_CARE_PROVIDER_SITE_OTHER): Payer: Medicare HMO | Admitting: Physician Assistant

## 2019-04-19 ENCOUNTER — Encounter: Payer: Self-pay | Admitting: Physician Assistant

## 2019-04-19 ENCOUNTER — Other Ambulatory Visit: Payer: Self-pay

## 2019-04-19 VITALS — BP 140/70 | HR 69 | Temp 98.7°F | Resp 16 | Ht 68.75 in | Wt 160.4 lb

## 2019-04-19 DIAGNOSIS — E1151 Type 2 diabetes mellitus with diabetic peripheral angiopathy without gangrene: Secondary | ICD-10-CM

## 2019-04-19 DIAGNOSIS — K219 Gastro-esophageal reflux disease without esophagitis: Secondary | ICD-10-CM | POA: Diagnosis not present

## 2019-04-19 DIAGNOSIS — Z125 Encounter for screening for malignant neoplasm of prostate: Secondary | ICD-10-CM | POA: Diagnosis not present

## 2019-04-19 DIAGNOSIS — E538 Deficiency of other specified B group vitamins: Secondary | ICD-10-CM | POA: Diagnosis not present

## 2019-04-19 DIAGNOSIS — Z0000 Encounter for general adult medical examination without abnormal findings: Secondary | ICD-10-CM

## 2019-04-19 DIAGNOSIS — I10 Essential (primary) hypertension: Secondary | ICD-10-CM | POA: Diagnosis not present

## 2019-04-19 LAB — CBC WITH DIFFERENTIAL/PLATELET
Basophils Absolute: 0 10*3/uL (ref 0.0–0.1)
Basophils Relative: 0.7 % (ref 0.0–3.0)
Eosinophils Absolute: 0.2 10*3/uL (ref 0.0–0.7)
Eosinophils Relative: 3 % (ref 0.0–5.0)
HCT: 36.9 % — ABNORMAL LOW (ref 39.0–52.0)
Hemoglobin: 12 g/dL — ABNORMAL LOW (ref 13.0–17.0)
Lymphocytes Relative: 17.2 % (ref 12.0–46.0)
Lymphs Abs: 1 10*3/uL (ref 0.7–4.0)
MCHC: 32.4 g/dL (ref 30.0–36.0)
MCV: 87.4 fl (ref 78.0–100.0)
Monocytes Absolute: 0.4 10*3/uL (ref 0.1–1.0)
Monocytes Relative: 6.9 % (ref 3.0–12.0)
Neutro Abs: 4.1 10*3/uL (ref 1.4–7.7)
Neutrophils Relative %: 72.2 % (ref 43.0–77.0)
Platelets: 204 10*3/uL (ref 150.0–400.0)
RBC: 4.22 Mil/uL (ref 4.22–5.81)
RDW: 14.3 % (ref 11.5–15.5)
WBC: 5.7 10*3/uL (ref 4.0–10.5)

## 2019-04-19 LAB — COMPREHENSIVE METABOLIC PANEL
ALT: 26 U/L (ref 0–53)
AST: 20 U/L (ref 0–37)
Albumin: 4.4 g/dL (ref 3.5–5.2)
Alkaline Phosphatase: 104 U/L (ref 39–117)
BUN: 20 mg/dL (ref 6–23)
CO2: 28 mEq/L (ref 19–32)
Calcium: 9.4 mg/dL (ref 8.4–10.5)
Chloride: 105 mEq/L (ref 96–112)
Creatinine, Ser: 1.05 mg/dL (ref 0.40–1.50)
GFR: 69.05 mL/min (ref >60.00)
Glucose, Bld: 94 mg/dL (ref 70–99)
Potassium: 4 mEq/L (ref 3.5–5.1)
Sodium: 141 mEq/L (ref 135–145)
Total Bilirubin: 0.6 mg/dL (ref 0.2–1.2)
Total Protein: 6.9 g/dL (ref 6.0–8.3)

## 2019-04-19 LAB — LIPID PANEL
Cholesterol: 154 mg/dL (ref 0–200)
HDL: 48.3 mg/dL (ref >39.00)
LDL Cholesterol: 89 mg/dL (ref 0–99)
NonHDL: 105.98
Total CHOL/HDL Ratio: 3
Triglycerides: 84 mg/dL (ref 0.0–149.0)
VLDL: 16.8 mg/dL (ref 0.0–40.0)

## 2019-04-19 LAB — HEMOGLOBIN A1C: Hgb A1c MFr Bld: 6.8 % — ABNORMAL HIGH (ref 4.6–6.5)

## 2019-04-19 LAB — PSA, MEDICARE: PSA: 1.21 ng/ml (ref 0.10–4.00)

## 2019-04-19 LAB — VITAMIN B12: Vitamin B-12: 242 pg/mL (ref 211–911)

## 2019-04-19 NOTE — Patient Instructions (Signed)
Please go to the lab today for blood work.  I will call you with your results. We will alter treatment regimen(s) if indicated by your results.   Continue medications as directed. Please make sure to schedule your eye appointment!  We will discuss things for memory once I make sure none of lab findings correlate to a cause.    Preventive Care 36 Years and Older, Male Preventive care refers to lifestyle choices and visits with your health care provider that can promote health and wellness. What does preventive care include?   A yearly physical exam. This is also called an annual well check.  Dental exams once or twice a year.  Routine eye exams. Ask your health care provider how often you should have your eyes checked.  Personal lifestyle choices, including: ? Daily care of your teeth and gums. ? Regular physical activity. ? Eating a healthy diet. ? Avoiding tobacco and drug use. ? Limiting alcohol use. ? Practicing safe sex. ? Taking low doses of aspirin every day. ? Taking vitamin and mineral supplements as recommended by your health care provider. What happens during an annual well check? The services and screenings done by your health care provider during your annual well check will depend on your age, overall health, lifestyle risk factors, and family history of disease. Counseling Your health care provider may ask you questions about your:  Alcohol use.  Tobacco use.  Drug use.  Emotional well-being.  Home and relationship well-being.  Sexual activity.  Eating habits.  History of falls.  Memory and ability to understand (cognition).  Work and work Statistician. Screening You may have the following tests or measurements:  Height, weight, and BMI.  Blood pressure.  Lipid and cholesterol levels. These may be checked every 5 years, or more frequently if you are over 64 years old.  Skin check.  Lung cancer screening. You may have this screening every  year starting at age 20 if you have a 30-pack-year history of smoking and currently smoke or have quit within the past 15 years.  Colorectal cancer screening. All adults should have this screening starting at age 9 and continuing until age 26. You will have tests every 1-10 years, depending on your results and the type of screening test. People at increased risk should start screening at an earlier age. Screening tests may include: ? Guaiac-based fecal occult blood testing. ? Fecal immunochemical test (FIT). ? Stool DNA test. ? Virtual colonoscopy. ? Sigmoidoscopy. During this test, a flexible tube with a tiny camera (sigmoidoscope) is used to examine your rectum and lower colon. The sigmoidoscope is inserted through your anus into your rectum and lower colon. ? Colonoscopy. During this test, a long, thin, flexible tube with a tiny camera (colonoscope) is used to examine your entire colon and rectum.  Prostate cancer screening. Recommendations will vary depending on your family history and other risks.  Hepatitis C blood test.  Hepatitis B blood test.  Sexually transmitted disease (STD) testing.  Diabetes screening. This is done by checking your blood sugar (glucose) after you have not eaten for a while (fasting). You may have this done every 1-3 years.  Abdominal aortic aneurysm (AAA) screening. You may need this if you are a current or former smoker.  Osteoporosis. You may be screened starting at age 61 if you are at high risk. Talk with your health care provider about your test results, treatment options, and if necessary, the need for more tests. Vaccines Your health care  provider may recommend certain vaccines, such as:  Influenza vaccine. This is recommended every year.  Tetanus, diphtheria, and acellular pertussis (Tdap, Td) vaccine. You may need a Td booster every 10 years.  Varicella vaccine. You may need this if you have not been vaccinated.  Zoster vaccine. You may need  this after age 67.  Measles, mumps, and rubella (MMR) vaccine. You may need at least one dose of MMR if you were born in 1957 or later. You may also need a second dose.  Pneumococcal 13-valent conjugate (PCV13) vaccine. One dose is recommended after age 85.  Pneumococcal polysaccharide (PPSV23) vaccine. One dose is recommended after age 45.  Meningococcal vaccine. You may need this if you have certain conditions.  Hepatitis A vaccine. You may need this if you have certain conditions or if you travel or work in places where you may be exposed to hepatitis A.  Hepatitis B vaccine. You may need this if you have certain conditions or if you travel or work in places where you may be exposed to hepatitis B.  Haemophilus influenzae type b (Hib) vaccine. You may need this if you have certain risk factors. Talk to your health care provider about which screenings and vaccines you need and how often you need them. This information is not intended to replace advice given to you by your health care provider. Make sure you discuss any questions you have with your health care provider. Document Released: 11/14/2015 Document Revised: 12/08/2017 Document Reviewed: 08/19/2015 Elsevier Interactive Patient Education  2019 [employer].

## 2019-04-19 NOTE — Progress Notes (Signed)
Patient presents to clinic today for annual exam.  Patient is fasting for labs.  Acute Concerns:  Memory: wife has mentioned concerned for memory. Can go to grocery and get other things and forget to get the one thing he was going to get.   Chronic Issues:  Uses a daily pill box to help to ensure taking medications as prescribed   HTN: checks BP about once a week. Endorses taking medications daily as directed. Patient denies chest pain, palpitations, lightheadedness, dizziness, vision changes or frequent headaches.  DM: checks sugar daily, today (100 in AM), often 110 for fasting AM BG. Denies any recent episodes of hypoglycemia or hyperglycemia. Has upcoming eye appointment scheduled. Foot exam up-to-date.   Diet: 3 meals a days and eats lots vegetables  Exercise: goes to [employer] and walks, wearing mask   Past Medical History:  Diagnosis Date  . Coronary artery disease   . Diabetes mellitus   . Diverticulosis   . Esophageal stricture   . GERD (gastroesophageal reflux disease)   . Hypertension   . Peripheral vascular disease [facility])     Past Surgical History:  Procedure Laterality Date  . CORONARY ANGIOPLASTY WITH STENT PLACEMENT    . ESOPHAGOGASTRODUODENOSCOPY  06/07/2012   Procedure: ESOPHAGOGASTRODUODENOSCOPY (EGD);  Surgeon: Lafayette Dragon, MD;  Location: [facility];  Service: Endoscopy;  Laterality: N/A;  . SHOULDER SURGERY  2006    Current Outpatient Medications on File Prior to Visit  Medication Sig Dispense Refill  . amLODipine (NORVASC) 5 MG tablet Take 1 tablet (5 mg total) by mouth daily. 90 tablet 2  . aspirin 81 MG tablet Take 81 mg by mouth daily.      Marland Kitchen atorvastatin (LIPITOR) 40 MG tablet Take 1 tablet by mouth once daily 90 tablet 0  . cimetidine (TAGAMET) 400 MG tablet Take 1 tablet (400 mg total) by mouth at bedtime. 90 tablet 4  . Dihydroxyaluminum Sod Carb (ROLAIDS PO) Take by mouth as needed.    . fluticasone (FLONASE) 50 MCG/ACT nasal spray Place 2  sprays into both nostrils daily. 16 g 6  . losartan (COZAAR) 100 MG tablet Take 1 tablet (100 mg total) by mouth daily. 90 tablet 3  . metFORMIN (GLUCOPHAGE) 1000 MG tablet Take 1 tablet (1,000 mg total) by mouth 2 (two) times daily with a meal. 180 tablet 1  . vitamin B-12 (CYANOCOBALAMIN) 1000 MCG tablet Take 1,000 mcg by mouth daily.     No current facility-administered medications on file prior to visit.     No Known Allergies  Family History  Problem Relation Age of Onset  . Breast cancer Mother   . Healthy Son   . Healthy Son   . Colon cancer Neg Hx     Social History   Socioeconomic History  . Marital status: Married    Spouse name: Not on file  . Number of children: 3  . Years of education: Not on file  . Highest education level: Not on file  Occupational History  . Occupation: Holiday representative  . Financial resource strain: Not on file  . Food insecurity    Worry: Not on file    Inability: Not on file  . Transportation needs    Medical: Not on file    Non-medical: Not on file  Tobacco Use  . Smoking status: Former Research scientist (life sciences)  . Smokeless tobacco: Never Used  Substance and Sexual Activity  . Alcohol use: No  . Drug use: No  .  Sexual activity: Not Currently  Lifestyle  . Physical activity    Days per week: Not on file    Minutes per session: Not on file  . Stress: Not on file  Relationships  . Social Herbalist on phone: Not on file    Gets together: Not on file    Attends religious service: Not on file    Active member of club or organization: Not on file    Attends meetings of clubs or organizations: Not on file    Relationship status: Not on file  . Intimate partner violence    Fear of current or ex partner: Not on file    Emotionally abused: Not on file    Physically abused: Not on file    Forced sexual activity: Not on file  Other Topics Concern  . Not on file  Social History Narrative   Daily caffeine     Review of Systems   Constitutional: Negative for chills, fever and weight loss.  HENT: Negative for ear discharge, ear pain, hearing loss and tinnitus.   Eyes: Negative for blurred vision, double vision and photophobia.       A little over a year last eye exam  Respiratory: Negative for cough and shortness of breath.   Cardiovascular: Negative for chest pain and palpitations.  Gastrointestinal: Negative for blood in stool, constipation, diarrhea, melena, nausea and vomiting.  Genitourinary: Negative for dysuria, flank pain, frequency, hematuria and urgency.       Nocturia x 1  Musculoskeletal: Negative for falls.  Neurological: Negative for dizziness, loss of consciousness and headaches.  Endo/Heme/Allergies: Negative for environmental allergies.  Psychiatric/Behavioral: Negative for depression, hallucinations and suicidal ideas. The patient is not nervous/anxious and does not have insomnia.    BP 140/70   Pulse 69   Temp 98.7 F (37.1 C) (Skin)   Resp 16   Ht 5' 8.75" (1.746 m)   Wt 160 lb 6.4 oz (72.8 kg)   SpO2 98%   BMI 23.86 kg/m   Physical Exam Vitals signs reviewed.  Constitutional:      General: He is not in acute distress.    Appearance: He is well-developed. He is not diaphoretic.  HENT:     Head: Normocephalic and atraumatic.     Right Ear: Tympanic membrane, ear canal and external ear normal.     Left Ear: Tympanic membrane, ear canal and external ear normal.     Nose: Nose normal.     Mouth/Throat:     Pharynx: No posterior oropharyngeal erythema.  Eyes:     Conjunctiva/sclera: Conjunctivae normal.     Pupils: Pupils are equal, round, and reactive to light.  Neck:     Musculoskeletal: Neck supple.     Thyroid: No thyromegaly.  Cardiovascular:     Rate and Rhythm: Normal rate and regular rhythm.     Heart sounds: Normal heart sounds.  Pulmonary:     Effort: Pulmonary effort is normal. No respiratory distress.     Breath sounds: Normal breath sounds. No wheezing or rales.   Chest:     Chest wall: No tenderness.  Abdominal:     General: Bowel sounds are normal. There is no distension.     Palpations: Abdomen is soft. There is no mass.     Tenderness: There is no abdominal tenderness. There is no guarding or rebound.  Lymphadenopathy:     Cervical: No cervical adenopathy.  Skin:    General: Skin is warm  and dry.     Findings: No rash.  Neurological:     Mental Status: He is alert and oriented to person, place, and time.     Cranial Nerves: No cranial nerve deficit.    MMSE - Mini Mental State Exam 04/19/2019  Orientation to time 5  Orientation to Place 5  Registration 3  Attention/ Calculation 3  Recall 3  Language- name 2 objects 2  Language- repeat 1  Language- follow 3 step command 3  Language- read & follow direction 1  Write a sentence 1  Copy design 1  Total score 28     No results found for this or any previous visit (from the past 2160 hour(s)).  Assessment/Plan: 1. Visit for preventive health examination Depression screen negative. Health Maintenance reviewed . MMSE completed with score 28/30 Preventive schedule discussed and handout given in AVS. Will obtain fasting labs today.   2. Prostate cancer screening The natural history of prostate cancer and ongoing controversy regarding screening and potential treatment outcomes of prostate cancer has been discussed with the patient. The meaning of a false positive PSA and a false negative PSA has been discussed. He indicates understanding of the limitations of this screening test and wishes to proceed with screening PSA testing.  - PSA, Medicare  3. 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 Taking medications as directed. Immunizations up-to-date. Foot exam up-to-date. Eye exam scheduled. Repeat labs today. Will alter regimen accordingly. - CBC with Differential/Platelet - Comprehensive metabolic panel - Hemoglobin A1c - Lipid panel  4.  Essential hypertension Stable for age today. Asymptomatic. Repeat labs today. Continue current regimen. - CBC with Differential/Platelet - Comprehensive metabolic panel - Hemoglobin A1c - Lipid panel  5. Gastroesophageal reflux disease without esophagitis Rare symptoms per patient. Tagamet as needed with good result. Will monitor.  6. B12 deficiency Asymptomatic at present. Repeat levels today. - B12   Leeanne Rio, PA-C

## 2019-04-20 ENCOUNTER — Telehealth: Payer: Self-pay | Admitting: Physician Assistant

## 2019-04-20 NOTE — Telephone Encounter (Signed)
At patient's physical yesterday, he discussed his memory issues brought up by his wife. PCP completed a memory test and checked labs.

## 2019-04-20 NOTE — Telephone Encounter (Signed)
Patient wife states she notice memory changes. She is discussing about Aricept to help his memory. He becomes more irritable, aggravated easily. She sees short term memory. She says he may has a little Parkinson's. Worried about his money. Shuffles his feet.  She says he is smart and able to pass the test. She is very concerned about .

## 2019-04-20 NOTE — Telephone Encounter (Signed)
Please advise    Copied from [place] [id number]. Topic: General - Other >> Apr 20, 2019 12:17 PM Celene Kras A wrote: Reason for CRM: Pts wife called stating pt had an appt yesterday. She states she believes her husband has dementia. Pts wife was trying to get in contact with nurse. Pts wife states pt did not mention this in his appt she states she knows something is wrong with him and he needs help. Pts wife states pt will be mad if he finds out she was talking about this. She is requesting that you only call her cell phone number [phone]. Pts wife said she rarely uses her cell phone but to please only call that number. Please advise.

## 2019-04-22 NOTE — Telephone Encounter (Signed)
See result note. He scored 28/30 on his MMSE which is fantastic. B12 was in the normal range but in the low end of normal so we are changing his dose of this to increase levels and see if it helps memory. At the same time, let's set him up for an assessment with Neurology for dementia and parkinson's.

## 2019-04-24 ENCOUNTER — Other Ambulatory Visit: Payer: Self-pay

## 2019-04-24 DIAGNOSIS — E538 Deficiency of other specified B group vitamins: Secondary | ICD-10-CM

## 2019-04-24 DIAGNOSIS — R413 Other amnesia: Secondary | ICD-10-CM

## 2019-04-24 MED ORDER — CYANOCOBALAMIN 2000 MCG PO TABS: 2000.0000 ug | ORAL_TABLET | Freq: Every day | ORAL | 0 refills | Status: AC

## 2019-04-25 DIAGNOSIS — R69 Illness, unspecified: Secondary | ICD-10-CM | POA: Diagnosis not present

## 2019-04-26 ENCOUNTER — Encounter: Payer: Medicare HMO | Admitting: Physician Assistant

## 2019-05-01 ENCOUNTER — Encounter: Payer: Self-pay | Admitting: Neurology

## 2019-05-12 ENCOUNTER — Other Ambulatory Visit: Payer: Self-pay | Admitting: Physician Assistant

## 2019-05-28 ENCOUNTER — Other Ambulatory Visit: Payer: Self-pay

## 2019-05-28 ENCOUNTER — Ambulatory Visit (INDEPENDENT_AMBULATORY_CARE_PROVIDER_SITE_OTHER): Payer: Medicare HMO

## 2019-05-28 DIAGNOSIS — E538 Deficiency of other specified B group vitamins: Secondary | ICD-10-CM | POA: Diagnosis not present

## 2019-05-28 LAB — VITAMIN B12: Vitamin B-12: 1297 pg/mL — ABNORMAL HIGH (ref 211–911)

## 2019-06-05 ENCOUNTER — Ambulatory Visit: Payer: Medicare HMO | Admitting: Neurology

## 2019-06-08 ENCOUNTER — Encounter: Payer: Self-pay | Admitting: Neurology

## 2019-06-08 ENCOUNTER — Ambulatory Visit (INDEPENDENT_AMBULATORY_CARE_PROVIDER_SITE_OTHER): Payer: Medicare HMO | Admitting: Neurology

## 2019-06-08 ENCOUNTER — Other Ambulatory Visit: Payer: Self-pay

## 2019-06-08 VITALS — BP 143/64 | HR 71 | Ht 68.0 in | Wt 158.2 lb

## 2019-06-08 DIAGNOSIS — K117 Disturbances of salivary secretion: Secondary | ICD-10-CM

## 2019-06-08 DIAGNOSIS — G3184 Mild cognitive impairment, so stated: Secondary | ICD-10-CM

## 2019-06-08 DIAGNOSIS — G20 Parkinson's disease: Secondary | ICD-10-CM | POA: Diagnosis not present

## 2019-06-08 MED ORDER — ATROPINE SULFATE 1 % OP SOLN: OPHTHALMIC | 12 refills | Status: DC
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
## 2019-06-08 NOTE — Patient Instructions (Addendum)
1. For the drooling, I am giving you atropine drops. These drops can be diluted 67ml in 159ml of water and used as a mouth rinse up to three times a day. You can also try 2 drops under the tongue up to 3 times a day without diluting it if that works better for you, but sometimes it is difficult to manipulate the dropper and get it in properly.  2. Schedule MRI brain with and without contrast. [facility] will contact you to schedule this. You may also contact them to schedule at [phone].  3. Schedule Neurocognitive testing. Our office will call you with the appointment date and time.  4. Follow-up in 3-4 months, call for any changes  FALL PRECAUTIONS: Be cautious when walking. Scan the area for obstacles that may increase the risk of trips and falls. When getting up in the mornings, sit up at the edge of the bed for a few minutes before getting out of bed. Consider elevating the bed at the head end to avoid drop of blood pressure when getting up. Walk always in a well-lit room (use night lights in the walls). Avoid area rugs or power cords from appliances in the middle of the walkways. Use a walker or a cane if necessary and consider physical therapy for balance exercise. Get your eyesight checked regularly.  FINANCIAL OVERSIGHT: Supervision, especially oversight when making financial decisions or transactions is also recommended.  HOME SAFETY: Consider the safety of the kitchen when operating appliances like stoves, microwave oven, and blender. Consider having supervision and share cooking responsibilities until no longer able to participate in those. Accidents with firearms and other hazards in the house should be identified and addressed as well.  ABILITY TO BE LEFT ALONE: If patient is unable to contact 911 operator, consider using LifeLine, or when the need is there, arrange for someone to stay with patients. Smoking is a fire hazard, consider supervision or cessation. Risk of wandering  should be assessed by caregiver and if detected at any point, supervision and safe proof recommendations should be instituted.  RECOMMENDATIONS FOR ALL PATIENTS WITH MEMORY PROBLEMS: 1. Continue to exercise (Recommend 30 minutes of walking everyday, or 3 hours every week) 2. Increase social interactions - continue going to [place] and enjoy social gatherings with friends and family 3. Eat healthy, avoid fried foods and eat more fruits and vegetables 4. Maintain adequate blood pressure, blood sugar, and blood cholesterol level. Reducing the risk of stroke and cardiovascular disease also helps promoting better memory. 5. Avoid stressful situations. Live a simple life and avoid aggravations. Organize your time and prepare for the next day in anticipation. 6. Sleep well, avoid any interruptions of sleep and avoid any distractions in the bedroom that may interfere with adequate sleep quality 7. Avoid sugar, avoid sweets as there is a strong link between excessive sugar intake, diabetes, and cognitive impairment The Mediterranean diet has been shown to help patients reduce the risk of progressive memory disorders and reduces cardiovascular risk. This includes eating fish, eat fruits and green leafy vegetables, nuts like almonds and hazelnuts, walnuts, and also use olive oil. Avoid fast foods and fried foods as much as possible. Avoid sweets and sugar as sugar use has been linked to worsening of memory function.

## 2019-06-12 ENCOUNTER — Telehealth: Payer: Self-pay

## 2019-06-12 NOTE — Telephone Encounter (Signed)
These are the instructions on the AVS: These drops can be diluted 3ml in 145ml of water and used as a mouth rinse up to three times a day. You can also try 2 drops under the tongue up to 3 times a day without diluting it   Would not fit in the online order, if they need it faxed, pls fax, thanks!

## 2019-06-12 NOTE — Telephone Encounter (Signed)
Faxed instructions for atropine drops to [facility] in [place].

## 2019-06-12 NOTE — Telephone Encounter (Signed)
[facility] on [address] needing clarification on Atropine ophthalmic solution.  How do you want pt to take the medication.  They want the doctor to be more specific. The instructions currently state to use as instructed in your mouth.

## 2019-07-02 ENCOUNTER — Other Ambulatory Visit: Payer: Self-pay

## 2019-07-02 DIAGNOSIS — R6889 Other general symptoms and signs: Secondary | ICD-10-CM | POA: Diagnosis not present

## 2019-07-02 DIAGNOSIS — Z20822 Contact with and (suspected) exposure to covid-19: Secondary | ICD-10-CM

## 2019-07-04 LAB — NOVEL CORONAVIRUS, NAA: SARS-CoV-2, NAA: NOT DETECTED

## 2019-07-11 ENCOUNTER — Other Ambulatory Visit: Payer: Self-pay

## 2019-07-11 ENCOUNTER — Ambulatory Visit
Admission: RE | Admit: 2019-07-11 | Discharge: 2019-07-11 | Disposition: A | Discharge status: Home / Self Care | Payer: Medicare HMO | Source: Ambulatory Visit | Attending: Neurology | Admitting: Neurology

## 2019-07-11 DIAGNOSIS — R531 Weakness: Secondary | ICD-10-CM | POA: Diagnosis not present

## 2019-07-11 DIAGNOSIS — G3184 Mild cognitive impairment, so stated: Secondary | ICD-10-CM

## 2019-07-11 DIAGNOSIS — R262 Difficulty in walking, not elsewhere classified: Secondary | ICD-10-CM | POA: Diagnosis not present

## 2019-07-11 DIAGNOSIS — K117 Disturbances of salivary secretion: Secondary | ICD-10-CM

## 2019-07-11 MED ORDER — GADOBENATE DIMEGLUMINE 529 MG/ML IV SOLN
14.0000 mL | Freq: Once | INTRAVENOUS | Status: AC | PRN
  Administered 2019-07-11: 09:00:00 14 mL via INTRAVENOUS

## 2019-07-16 ENCOUNTER — Telehealth: Payer: Self-pay

## 2019-07-16 NOTE — Telephone Encounter (Signed)
Left message informing pt and wife of normal MRI results and to proceed with memory testing as discussed at visit. Instructed to call back with any concerns.

## 2019-07-16 NOTE — Telephone Encounter (Signed)
-----   Message from Cameron Sprang, MD sent at 07/13/2019  5:53 AM EDT ----- Pls let patient/wife know MRI brain did not show any evidence of tumor, stroke, or bleed. It showed age-related changes. Proceed with memory testing as discussed, thanks

## 2019-07-24 ENCOUNTER — Other Ambulatory Visit: Payer: Self-pay

## 2019-07-24 DIAGNOSIS — Z20822 Contact with and (suspected) exposure to covid-19: Secondary | ICD-10-CM

## 2019-07-24 DIAGNOSIS — R6889 Other general symptoms and signs: Secondary | ICD-10-CM | POA: Diagnosis not present

## 2019-07-25 ENCOUNTER — Other Ambulatory Visit: Payer: Self-pay | Admitting: Emergency Medicine

## 2019-07-25 ENCOUNTER — Telehealth: Payer: Self-pay | Admitting: Physician Assistant

## 2019-07-25 DIAGNOSIS — E1151 Type 2 diabetes mellitus with diabetic peripheral angiopathy without gangrene: Secondary | ICD-10-CM

## 2019-07-25 MED ORDER — METFORMIN HCL 1000 MG PO TABS: 1000.0000 mg | ORAL_TABLET | Freq: Two times a day (BID) | ORAL | 1 refills | Status: DC

## 2019-07-25 NOTE — Telephone Encounter (Signed)
Pt called in asking for a refill on the Metformin, pt can be reached at the home # and he uses [facility] in [place]

## 2019-07-25 NOTE — Telephone Encounter (Signed)
Rx for Metformin sent to patient preferred pharmacy

## 2019-07-26 LAB — NOVEL CORONAVIRUS, NAA: SARS-CoV-2, NAA: NOT DETECTED

## 2019-07-28 DIAGNOSIS — R69 Illness, unspecified: Secondary | ICD-10-CM | POA: Diagnosis not present

## 2019-07-30 ENCOUNTER — Telehealth: Payer: Self-pay

## 2019-07-30 NOTE — Telephone Encounter (Signed)
Patient called in stating that he received a call about needing to complete either a cologuard or colonoscopy. Due in early December.

## 2019-08-02 DIAGNOSIS — H43811 Vitreous degeneration, right eye: Secondary | ICD-10-CM | POA: Diagnosis not present

## 2019-08-02 DIAGNOSIS — H524 Presbyopia: Secondary | ICD-10-CM | POA: Diagnosis not present

## 2019-08-08 ENCOUNTER — Other Ambulatory Visit: Payer: Self-pay | Admitting: Emergency Medicine

## 2019-08-08 DIAGNOSIS — Z1211 Encounter for screening for malignant neoplasm of colon: Secondary | ICD-10-CM

## 2019-08-08 NOTE — Telephone Encounter (Signed)
Order/Referral placed to GI for up coming Colonoscopy. Patient is due for repeat in Dec 2020

## 2019-08-15 ENCOUNTER — Other Ambulatory Visit: Payer: Self-pay

## 2019-08-15 ENCOUNTER — Ambulatory Visit: Payer: Medicare HMO

## 2019-08-15 ENCOUNTER — Ambulatory Visit (INDEPENDENT_AMBULATORY_CARE_PROVIDER_SITE_OTHER): Payer: Medicare HMO | Admitting: Psychology

## 2019-08-15 ENCOUNTER — Encounter: Payer: Self-pay | Admitting: Psychology

## 2019-08-15 DIAGNOSIS — G3184 Mild cognitive impairment, so stated: Secondary | ICD-10-CM | POA: Diagnosis not present

## 2019-08-15 NOTE — Progress Notes (Signed)
NEUROPSYCHOLOGICAL EVALUATION Walter [name]. [facility] Department of Neurology  Reason for Referral:   Walter [name] is a 73 y.o. Caucasian male referred by Ellouise Newer, M.D., to characterize his current cognitive functioning and assist with diagnostic clarity and treatment planning in the context of concerns regarding an atypical Parkinsonism presentation, several medical comorbidities, and subjective cognitive decline.  Assessment and Plan:   Clinical Impression(s): Overall, Walter [name] pattern of performance is suggestive of mild subcortical/frontal subcortical dysfunction evidenced by primary weaknesses across domains of processing speed, attention/concentration, verbal fluency, and encoding (i.e., learning) aspects of visual memory. Performance variability was also seen across executive functioning. Cognitive performance was within normal limits across domains of receptive language, confrontation naming, visuospatial functioning, verbal learning and memory, and retrieval/consolidation aspects of visual memory. 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  Regarding etiology, I share concerns surrounding an atypical Parkinson's disease presentation. During today's evaluation, Walter [name] exhibited a slowed, shuffling gait, as well as a mild action tremor in his right hand while performing motor-based cognitive tasks. The pattern of dysfunction seen across testing is also largely consistent with Parkinson's disease and other associated conditions; however, he did not exhibit pronounced deficits in visuospatial functioning. 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 However, neuroimaging suggested less than expected  small vessel ischemic changes relative to age matched peers, as well as the absence of any possible small strokes, which is inconsistent with a typical vascular Parkinson's presentation. Likewise, while he did report symptoms potentially consistent with a REM sleep disorder, no other behavioral characteristics were consistent with a Lewy Body dementia presentation. Finally, current disease progression appears slowed relative to what might be expected for other more rapidly progressing Parkinsonism presentations and he did not report a strong asymmetry of movement dysfunction or a history of falls. Continued medical monitoring will be important moving forward.  Recommendations: Repeat neuropsychological evaluation in 12-18 months (or sooner if functional decline is noted) is recommended to assess the trajectory of future cognitive decline should it occur. This will be helpful in further efforts towards diagnostic clarity.   Optimal control of vascular risk factors (including safe cardiovascular exercise and adherence to dietary recommendations) is encouraged.  Continued participation in activities which provide mental stimulation and social interaction is also recommended.   When learning new information, he would benefit from information being broken up into small, manageable pieces. He may also find it helpful to articulate the material in his own words and in a context to promote encoding at the onset of a new task. This material may need to be repeated multiple times to promote encoding.  To address problems with processing speed, he may wish to consider:   -Ensuring that he is alerted when essential material or instructions are being presented   -Adjusting the speed at which new information is presented   -Allowing for more time in comprehending, processing, and responding in conversation   -Repeating and paraphrasing instructions or conversations aloud  To address problems with working memory,  he may wish to consider:   -Avoiding external distractions when needing to concentrate   -Writing down complicated information and using checklists   -Attempting and completing one task at a time (i.e., no multi-tasking)   -Reducing the amount of information considered at one time  Review of Records:   Walter [name] was seen by [facility] Ellouise Newer, M.D.) on 06/08/2019 for an evaluation of worsening memory. Medical history is notably for hypertension, diabetes, CAD, and peripheral vascular disease. Walter [name] acknowledged short-term memory concerns approximately 3 years prior, including difficulties remembering names. He denied difficulties completing ADLs. His wife reported ongoing memory concerns dating back at least 4 years. She also noted that family and friends have expressed some concerns about Walter [name] due to him exhibiting a change in his demeanor (e.g., more irritable now than typical). His wife also expressed concerns regarding Parkinson's disease, stating that she has noticed that Walter [name] overall gait has slowed more than what is typical. Dr. Amparo Bristol neurological exam revealed drooling, cogwheeling, mild bradykinesia, and mild head tremor, concerning for parkinsonism but not fulfilling all criteria for Parkinson's Disease. Performance on a brief cognitive screening instrument [facility]) was in the abnormal range (21/30). Points were lost across the following domains: visuospatial/executive (2/5), naming (2/3), sentence repetition (1/2), verbal fluency (0/1), verbal abstraction (0/2), and delayed recall (3/5). He earned one bonus point given his educational background. Subsequently, [name] was referred for a comprehensive neuropsychological evaluation to characterize his cognitive abilities and to assist with diagnostic clarity and future treatment planning.  Head CT on 01/06/2019 revealed moderate parenchymal volume loss greater than expected for the patient's age. Patchy  supratentorial white matter hypodensities less than expected for patient's age was also reported. Brain MRI on 07/11/2019 was overall unremarkable. However, suspicion was expressed regarding disproportionate volume loss in the midbrain. Mild for age scattered nonspecific white matter hyperintensities were also noted.   Past Medical History:  Diagnosis Date   Coronary artery disease    Diabetes mellitus    Diverticulosis    Esophageal stricture    GERD (gastroesophageal reflux disease)    Hypertension    Peripheral vascular disease ([place])     Past Surgical History:  Procedure Laterality Date   CORONARY ANGIOPLASTY WITH STENT PLACEMENT     ESOPHAGOGASTRODUODENOSCOPY  06/07/2012   Procedure: ESOPHAGOGASTRODUODENOSCOPY (EGD);  Surgeon: Lafayette Dragon, MD;  Location: [facility];  Service: Endoscopy;  Laterality: N/A;   SHOULDER SURGERY  2006    Family History  Problem Relation Age of Onset   Breast cancer Mother    Healthy Son    Healthy Son    Parkinson's disease Sister    Colon cancer Neg Hx      Current Outpatient Medications:    amLODipine (NORVASC) 5 MG tablet, Take 1 tablet (5 mg total) by mouth daily., Disp: 90 tablet, Rfl: 2   aspirin 81 MG tablet, Take 81 mg by mouth daily.  , Disp: , Rfl:    atorvastatin (LIPITOR) 40 MG tablet, Take 1 tablet by mouth once daily, Disp: 90 tablet, Rfl: 1   atropine 1 % ophthalmic solution, Use as instructed in your mouth, Disp: 2 mL, Rfl: 12   cimetidine (TAGAMET) 400 MG tablet, Take 1 tablet (400 mg total) by mouth at bedtime., Disp: 90 tablet, Rfl: 4   cyanocobalamin 2000 MCG tablet, Take 1 tablet (2,000 mcg total) by mouth daily., Disp: 30 tablet, Rfl: 0   Dihydroxyaluminum Sod Carb (ROLAIDS PO), Take by mouth as needed., Disp: , Rfl:    fluticasone (FLONASE) 50 MCG/ACT nasal spray, Place 2 sprays into both nostrils daily., Disp: 16 g, Rfl: 6   losartan (COZAAR) 100 MG tablet, Take 1 tablet (100 mg total) by mouth  daily., Disp: 90 tablet, Rfl: 3  metFORMIN (GLUCOPHAGE) 1000 MG tablet, Take 1 tablet (1,000 mg total) by mouth 2 (two) times daily with a meal., Disp: 180 tablet, Rfl: 1  Clinical Interview:   Cognitive Symptoms: Decreased short-term memory: Endorsed. Mr. Waldvogel acknowledged symptoms of generalized forgetfulness, which have increased over the past 6 months. Specific examples included trouble remembering the details of past conversations or events, as well as remembering names.  Decreased long-term memory: Denied. Decreased attention/concentration: Denied. Increased ease of distractibility: Denied. Reduced processing speed: Endorsed. He noted that his wife would likely express that he has exhibited a decline in this area. He later acknowledged that it may take him longer to process information or perform certain tasks.  Difficulties with executive functions: Denied. Difficulties with emotion regulation: Denied. Difficulties with receptive language: Denied under the assumption that he has heard the individual speaking to him adequately. Difficulties with word finding: Denied. Decreased visuoperceptual ability: Denied.  Trajectory of deficits: Overall deficits were said to be present for the past 3-4 years and exhibit a very gradual decline. However, short-term memory difficulties were said to have noticeably worsened over the past 6 months.   Difficulties completing ADLs: Denied.  Additional Medical History: History of traumatic brain injury/concussion: Denied. History of stroke: Denied. History of seizure activity: Denied. History of known exposure to toxins: Denied. Symptoms of chronic pain: Denied. Experience of frequent headaches/migraines: Denied. He did acknowledge some remote experiences of sinus/migraine headaches.  Frequent instances of dizziness/vertigo: Denied.  Sensory changes: Denied. He did acknowledge experiencing dry eyes, which can temporarily impact visual acuity.  Other sensory changes/difficulties (e.g., hearing, taste, or smell) were denied.  Balance/coordination difficulties: Largely denied. He described his balance as "fair" overall. However, he did acknowledge that "walking is hard sometimes." He was unable to articulate this experience fully, but eventually attributed this to ankle discomfort/difficulties. A history of falls was denied.  Other motor difficulties: Denied.  Sleep History: Estimated hours obtained each night: 8-9 hours. Difficulties falling asleep: Very rarely. Difficulties staying asleep: Largely denied outside of waking once per night to use the restroom. Feels rested and refreshed upon awakening: Endorsed.  History of snoring: Unknown. History of waking up gasping for air: Denied. Witnessed breath cessation while asleep: Denied.  History of vivid dreaming: Denied. Excessive movement while asleep: Endorsed. Mr. Bozell acknowledged some movement while asleep, including waking up to find bed covers "all over the place." He denied ever falling out of bed or known instances in which he has accidentally hit or kicked his wife. Instances of acting out his dreams: Denied.  Psychiatric/Behavioral Health History: Depression: Denied. Mr. Gwyn described his current mood as "satisfied." He denied a history of mental health concerns or prior diagnoses. He likewise denied current or remote suicidal ideation, intent, or plan.  Anxiety: Denied. Mania: Denied. Trauma History: Denied. Visual/auditory hallucinations: Denied. Delusional thoughts: Denied. Mental health treatment: Denied.  Tobacco: Denied. Alcohol: Mr. Sterman denied current alcohol consumption, as well as a history of problematic alcohol use, abuse, or dependence.  Recreational drugs: Denied. Caffeine: Endorsed. He reported consuming two cups of coffee per day.  Academic/Vocational History: Highest level of educational attainment: 9 years. He reported quitting school in  order to enter the work force.  History of developmental delay: Unclear. He described reading as a longstanding academic weakness.  History of grade repetition: Endorsed. He reported repeating the 1st grade due to poor academic performance.  Enrollment in special education courses: Denied. History of diagnosed specific learning disability: Denied. History of ADHD: Denied.  Employment:  Retired. He previously worked as an Passenger transport manager.   Evaluation Results:   Behavioral Observations: Mr. Nott was unaccompanied, arrived to his appointment on time, and was appropriately dressed and groomed. Observed gait was slowed and appeared shuffling in nature. Balance instability was not observed. Gross motor functioning appeared intact upon informal observation and no abnormal movements (e.g., tremors) were noted during interview. However, a slight action tremor in his right hand was observed while performing motor-based tasks during test administration. His affect was generally relaxed and positive, but did range appropriately given the subject being discussed during the clinical interview or the task at hand during testing procedures. Spontaneous speech was fluent and word finding difficulties were not observed during the clinical interview. Sustained attention was appropriate throughout. Thought processes were coherent, organized, and normal in content. Task engagement was adequate and he persisted well when challenged. Overall, Mr. Fittro was cooperative with the clinical interview and subsequent testing procedures.   Adequacy of Effort: The validity of neuropsychological testing is limited by the extent to which the individual being tested may be assumed to have exerted adequate effort during testing. Mr. Klauser expressed his intention to perform to the best of his abilities and exhibited adequate task engagement and persistence. Scores across stand-alone and embedded performance validity measures were within  expectation. As such, the results of the current evaluation are believed to be a valid representation of Mr. Lagunes current cognitive functioning.  Test Results: Mr. Letbetter was generally oriented at the time of the current evaluation. Points were lost for him stating the incorrect date (1/2).  Intellectual abilities based upon educational and vocational attainment were estimated to be in the below average range. Premorbid abilities were estimated to be within the well below average range based upon a single-word reading test.   Processing speed was exceptionally low to well below average. Basic attention was well below average. More complex attention (e.g., working memory) was also well below average. Performance across tasks assessing executive functions (e.g., cognitive flexibility, response inhibition, nonverbal abstract reasoning, analytical problem solving) were variable, ranging from the exceptionally low to average normative ranges.  Assessed receptive language abilities were within normal limits. Likewise, Mr. Marinoff did not exhibit any difficulties comprehending task instructions and answered all questions asked of him appropriately. Assessed expressive language (e.g., verbal fluency and confrontation naming) was well below average (verbal fluency) to below average (confrontation naming).     Assessed visuospatial/visuoconstructional abilities were within normal limits.    Learning (i.e., encoding) of novel verbal information was within normal limits, while learning visual information was exceptionally low. Spontaneous delayed recall (i.e., retrieval) of previously learned information was commensurate with performance across initial learning trials. Retention rates were appropriate across memory measures. Performance across recognition tasks was likewise appropriate, suggesting evidence for appropriate information consolidation.   Results of emotional screening instruments suggested that  recent symptoms of generalized anxiety were in the minimal range, while symptoms of depression were within normal limits. A screening instrument assessing recent sleep quality suggested the presence of minimal sleep dysfunction.  Tables of Scores:   Note: This summary of test scores accompanies the interpretive report and should not be considered in isolation without reference to the appropriate sections in the text. Descriptors are based on appropriate normative data and may be adjusted based on clinical judgment. The terms impaired and within normal limits (WNL) are used when a more specific level of functioning cannot be determined.       Effort Testing:   DESCRIPTOR  Dot Counting Test: --- --- Within Expectation  CVLT-III Forced Choice Recognition: --- --- Within Expectation  BVMT-R Retention Percentage: --- --- Within Expectation       Orientation:      Raw Score Percentile   NAB Orientation, Form 1 28/29 27 Average       Intellectual Functioning:           Standard Score Percentile   Test of Premorbid Functioning: 55 7 Well Below Average       Memory:          Wechsler Memory Scale (WMS-IV):                       Raw Score (Scaled Score) Percentile     Logical Memory I 25/53 (8) 25 Average    Logical Memory II 16/39 (9) 37 Average    Logical Memory Recognition 17/23 26-50 Average       [place] Verbal Learning Test (CVLT-III) Brief Form: Raw Score (Scaled/Standard Score) Percentile     Total Trials 1-4 28/36 (109) 73 Average    Short-Delay Free Recall 8/9 (12) 75 Above Average    Long-Delay Free Recall 7/9 (11) 63 Average    Long-Delay Cued Recall 7/9 (11) 63 Average      Recognition Hits 9/9 (13) 84 Above Average      False Positive Errors 2 (7) 16 Below Average       Brief Visuospatial Memory Test (BVMT-R), Form 1: Raw Score (T Score) Percentile     Total Trials 1-3 6/36 (26) 1 Exceptionally Low    Delayed Recall 2/12 (27) 1 Exceptionally Low     Recognition Discrimination Index 4 11-16 Below Average      Recognition Hits 6/6 >16 Within Normal Limits      False Positive Errors 2 <1 Exceptionally Low        Attention/Executive Function:          Trail Making Test (TMT): Raw Score (T Score) Percentile     Part A 65 secs.,  1 error (35) 7 Well Below Average    Part B 300 secs.,  1 error (23) <1 Exceptionally Low        Scaled Score Percentile   WAIS-IV Coding: 8 25 Average       NAB Attention Module, Form 1: T Score Percentile     Digits Forward 33 5 Well Below Average    Digits Backwards 35 7 Well Below Average       D-KEFS Color-Word Interference Test: Raw Score (Scaled Score) Percentile     Color Naming 46 secs. (5) 5 Well Below Average    Word Reading 39 secs. (3) 1 Exceptionally Low    Inhibition 92 secs. (7) 16 Below Average      Total Errors 3 errors (10) 50 Average    Inhibition/Switching 103 secs. (7) 16 Below Average      Total Errors 2 errors (11) 63 Average       D-KEFS 20 Questions Test: Scaled Score Percentile     Total Weighted Achievement Score 9 37 Average    Initial Abstraction Score 5 5 Well Below Average       Language:          Verbal Fluency Test: Raw Score (T Score) Percentile     Phonemic Fluency (FAS) 20 (36) 8 Well Below Average    Animal Fluency 10 (31) 3 Well Below Average       NAB Language  Module, Form 2: T Score Percentile     Auditory Comprehension 58 79 Above Average    Naming 37 9 Below Average       Visuospatial/Visuoconstruction:      Raw Score Percentile   Clock Drawing: 9/10 --- Within Normal Limits       NAB Spatial Module, Form 2: T Score Percentile     Figure Drawing Copy 53 62 Average        Scaled Score Percentile   WAIS-IV Matrix Reasoning: 7 16 Below Average       Mood and Personality:      Raw Score Percentile   Geriatric Depression Scale: 3 --- Within Normal Limits  Geriatric Anxiety Scale: 5 --- Minimal    Somatic 4 --- Minimal    Cognitive 1 --- Minimal      Affective 0 --- Minimal       Additional Questionnaires:      Raw Score Percentile   PROMIS Sleep Disturbance Questionnaire: 16 --- None to Slight   Informed Consent and Coding/Compliance:   Mr. Lawrimore was provided with a verbal description of the nature and purpose of the present neuropsychological evaluation. Also reviewed were the foreseeable risks and/or discomforts and benefits of the procedure, limits of confidentiality, and mandatory reporting requirements of this provider. The patient was given the opportunity to ask questions and receive answers about the evaluation. Oral consent to participate was provided by the patient.   This evaluation was conducted by Christia Reading, Ph.D., licensed clinical neuropsychologist. Mr. Verhoff completed a 30-minute clinical interview, billed as one unit [phone], and 120 minutes of cognitive testing, billed as one unit [phone] and three additional units [phone]. Psychometrist Cruzita Lederer, B.S., assisted Dr. Melvyn Novas with test administration and scoring procedures. As a separate and discrete service, Dr. Melvyn Novas spent a total of 180 minutes in interpretation and report writing, billed as one unit 96132 and two units 96133.

## 2019-08-15 NOTE — Progress Notes (Signed)
   Neuropsychology Note   Walter [name] completed 105 minutes of neuropsychological testing with technician, Cruzita Lederer, B.S., under the supervision of Dr. Christia Reading, Ph.D., licensed neuropsychologist. The patient did not appear overtly distressed by the testing session, per behavioral observation or via self-report to the technician. Rest breaks were offered.    In considering the patient's current level of functioning, level of presumed impairment, nature of symptoms, emotional and behavioral responses during the interview, level of literacy, and observed level of motivation/effort, a battery of tests was selected and communicated to the psychometrician.   Communication between the psychologist and technician was ongoing throughout the testing session and changes were made as deemed necessary based on patient performance on testing, technician observations and additional pertinent factors such as those listed above.   Walter [name] will return within approximately two weeks for an interactive feedback session with Dr. Melvyn Novas at which time his test performances, clinical impressions, and treatment recommendations will be reviewed in detail. The patient understands he can contact our office should he require our assistance before this time.   Full report to follow.  105 minutes were spent face-to-face with patient administering standardized tests and 15 minutes were spent scoring (technician). [CPT [id number], [id number]

## 2019-08-22 ENCOUNTER — Other Ambulatory Visit: Payer: Self-pay

## 2019-08-22 ENCOUNTER — Encounter: Payer: Self-pay | Admitting: Psychology

## 2019-08-22 ENCOUNTER — Ambulatory Visit (INDEPENDENT_AMBULATORY_CARE_PROVIDER_SITE_OTHER): Payer: Medicare HMO | Admitting: Psychology

## 2019-08-22 DIAGNOSIS — G3184 Mild cognitive impairment, so stated: Secondary | ICD-10-CM

## 2019-08-22 NOTE — Progress Notes (Signed)
   Neuropsychology Feedback Session Walter [name]. [facility] Neurology  Reason for Referral:   Walter [name] a 73 y.o. Caucasian male referred by Ellouise Newer, M.D.,to characterize hiscurrent cognitive functioning and assist with diagnostic clarity and treatment planning in the context of concerns regarding an atypical Parkinsonism presentation, several medical comorbidities, and subjective cognitive decline.  Feedback:   Walter [name] completed a comprehensive neuropsychological evaluation on 08/15/2019. Please refer to that encounter for the full report. Briefly, results suggested mild subcortical/frontal subcortical dysfunction evidenced by primary weaknesses across domains of processing speed, attention/concentration, verbal fluency, and encoding (i.e., learning) aspects of visual memory. Performance variability was also seen across executive functioning. 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 Regarding etiology, I share concerns surrounding an atypical Parkinson's disease presentation. During today's evaluation, Walter [name] exhibited a slowed, shuffling gait, as well as a mild action tremor in his right hand while performing motor-based cognitive tasks. The pattern of dysfunction seen across testing is also largely consistent with Parkinson's disease and other associated conditions; however, he did not exhibit pronounced deficits in visuospatial functioning. Please see the full report for greater differential diagnosis discussion. Recommendations included following up with his neurologist and a completing a follow-up neuropsychological evaluation in 12-18 months to assess the trajectory of cognitive decline.  Walter [name] was unaccompanied. Content of the current session focused on the  results of his evaluation. I reassured him that memory scores were not suggestive of Alzheimer's disease, but rather trouble learning new information efficiently. Walter [name] was given the opportunity to ask questions and all his questions were answered. he was also encouraged to reach out should additional questions arise. A copy of his report was provided at the conclusion of the visit.      A total of 20 minutes were spent with Walter [name] during the current feedback session.

## 2019-09-04 ENCOUNTER — Encounter: Payer: Self-pay | Admitting: Gastroenterology

## 2019-09-05 ENCOUNTER — Encounter: Payer: Self-pay | Admitting: Gastroenterology

## 2019-10-01 ENCOUNTER — Encounter: Payer: Self-pay | Admitting: Gastroenterology

## 2019-10-01 ENCOUNTER — Other Ambulatory Visit: Payer: Self-pay

## 2019-10-01 ENCOUNTER — Ambulatory Visit (AMBULATORY_SURGERY_CENTER): Payer: Self-pay

## 2019-10-01 VITALS — Temp 96.8°F | Ht 70.0 in | Wt 160.4 lb

## 2019-10-01 DIAGNOSIS — Z1211 Encounter for screening for malignant neoplasm of colon: Secondary | ICD-10-CM

## 2019-10-01 MED ORDER — NA SULFATE-K SULFATE-MG SULF 17.5-3.13-1.6 GM/177ML PO SOLN: 1.0000 | Freq: Once | ORAL | 0 refills | Status: AC

## 2019-10-01 NOTE — Progress Notes (Signed)
Denies allergies to eggs or soy products. Denies complication of anesthesia or sedation. Denies use of weight loss medication. Denies use of O2.   Emmi instructions given for colonoscopy.  Covid screening is scheduled for Wed. 10/10/19 @ 2:30 Am.

## 2019-10-05 DIAGNOSIS — H43812 Vitreous degeneration, left eye: Secondary | ICD-10-CM | POA: Diagnosis not present

## 2019-10-05 DIAGNOSIS — E119 Type 2 diabetes mellitus without complications: Secondary | ICD-10-CM | POA: Diagnosis not present

## 2019-10-05 DIAGNOSIS — H43811 Vitreous degeneration, right eye: Secondary | ICD-10-CM | POA: Diagnosis not present

## 2019-10-05 DIAGNOSIS — Z7984 Long term (current) use of oral hypoglycemic drugs: Secondary | ICD-10-CM | POA: Diagnosis not present

## 2019-10-05 DIAGNOSIS — H3589 Other specified retinal disorders: Secondary | ICD-10-CM | POA: Diagnosis not present

## 2019-10-05 DIAGNOSIS — Z961 Presence of intraocular lens: Secondary | ICD-10-CM | POA: Diagnosis not present

## 2019-10-05 DIAGNOSIS — H11442 Conjunctival cysts, left eye: Secondary | ICD-10-CM | POA: Diagnosis not present

## 2019-10-05 DIAGNOSIS — H02055 Trichiasis without entropian left lower eyelid: Secondary | ICD-10-CM | POA: Diagnosis not present

## 2019-10-08 ENCOUNTER — Telehealth (INDEPENDENT_AMBULATORY_CARE_PROVIDER_SITE_OTHER): Payer: Medicare HMO | Admitting: Neurology

## 2019-10-08 ENCOUNTER — Other Ambulatory Visit: Payer: Self-pay

## 2019-10-08 DIAGNOSIS — G3184 Mild cognitive impairment, so stated: Secondary | ICD-10-CM

## 2019-10-08 DIAGNOSIS — G20 Parkinson's disease: Secondary | ICD-10-CM

## 2019-10-08 MED ORDER — DONEPEZIL HCL 10 MG PO TABS: ORAL_TABLET | ORAL | 11 refills | Status: DC
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
## 2019-10-10 ENCOUNTER — Other Ambulatory Visit: Payer: Self-pay | Admitting: Gastroenterology

## 2019-10-10 ENCOUNTER — Ambulatory Visit (INDEPENDENT_AMBULATORY_CARE_PROVIDER_SITE_OTHER): Payer: Medicare HMO

## 2019-10-10 DIAGNOSIS — Z1159 Encounter for screening for other viral diseases: Secondary | ICD-10-CM | POA: Diagnosis not present

## 2019-10-11 DIAGNOSIS — L821 Other seborrheic keratosis: Secondary | ICD-10-CM | POA: Diagnosis not present

## 2019-10-11 DIAGNOSIS — D485 Neoplasm of uncertain behavior of skin: Secondary | ICD-10-CM | POA: Diagnosis not present

## 2019-10-11 DIAGNOSIS — D1801 Hemangioma of skin and subcutaneous tissue: Secondary | ICD-10-CM | POA: Diagnosis not present

## 2019-10-11 DIAGNOSIS — L570 Actinic keratosis: Secondary | ICD-10-CM | POA: Diagnosis not present

## 2019-10-11 DIAGNOSIS — D225 Melanocytic nevi of trunk: Secondary | ICD-10-CM | POA: Diagnosis not present

## 2019-10-11 DIAGNOSIS — D044 Carcinoma in situ of skin of scalp and neck: Secondary | ICD-10-CM | POA: Diagnosis not present

## 2019-10-11 DIAGNOSIS — Z85828 Personal history of other malignant neoplasm of skin: Secondary | ICD-10-CM | POA: Diagnosis not present

## 2019-10-11 LAB — SARS CORONAVIRUS 2 (TAT 6-24 HRS): SARS Coronavirus 2: NEGATIVE

## 2019-10-15 ENCOUNTER — Ambulatory Visit (AMBULATORY_SURGERY_CENTER): Payer: Medicare HMO | Admitting: Gastroenterology

## 2019-10-15 ENCOUNTER — Encounter: Payer: Self-pay | Admitting: Gastroenterology

## 2019-10-15 ENCOUNTER — Other Ambulatory Visit: Payer: Self-pay

## 2019-10-15 VITALS — BP 136/62 | HR 68 | Temp 97.9°F | Resp 13 | Ht 70.0 in | Wt 160.4 lb

## 2019-10-15 DIAGNOSIS — Z1211 Encounter for screening for malignant neoplasm of colon: Secondary | ICD-10-CM

## 2019-10-15 MED ORDER — SODIUM CHLORIDE 0.9 % IV SOLN: 500.0000 mL | Freq: Once | INTRAVENOUS | Status: DC

## 2019-10-15 NOTE — Patient Instructions (Signed)
Impression/Recommendations:  Diverticulosis handout given to patient.  Resume previous diet. Continue present medications.  No repeat colonoscopy screening necessary based on current guidelines.  YOU HAD AN ENDOSCOPIC PROCEDURE TODAY AT [facility]:   Refer to the procedure report that was given to you for any specific questions about what was found during the examination.  If the procedure report does not answer your questions, please call your gastroenterologist to clarify.  If you requested that your care partner not be given the details of your procedure findings, then the procedure report has been included in a sealed envelope for you to review at your convenience later.  YOU SHOULD EXPECT: Some feelings of bloating in the abdomen. Passage of more gas than usual.  Walking can help get rid of the air that was put into your GI tract during the procedure and reduce the bloating. If you had a lower endoscopy (such as a colonoscopy or flexible sigmoidoscopy) you may notice spotting of blood in your stool or on the toilet paper. If you underwent a bowel prep for your procedure, you may not have a normal bowel movement for a few days.  Please Note:  You might notice some irritation and congestion in your nose or some drainage.  This is from the oxygen used during your procedure.  There is no need for concern and it should clear up in a day or so.  SYMPTOMS TO REPORT IMMEDIATELY:   Following lower endoscopy (colonoscopy or flexible sigmoidoscopy):  Excessive amounts of blood in the stool  Significant tenderness or worsening of abdominal pains  Swelling of the abdomen that is new, acute  Fever of 100F or higher For urgent or emergent issues, a gastroenterologist can be reached at any hour by calling [phone].   DIET:  We do recommend a small meal at first, but then you may proceed to your regular diet.  Drink plenty of fluids but you should avoid alcoholic beverages for  24 hours.  ACTIVITY:  You should plan to take it easy for the rest of today and you should NOT DRIVE or use heavy machinery until tomorrow (because of the sedation medicines used during the test).    FOLLOW UP: Our staff will call the number listed on your records 48-72 hours following your procedure to check on you and address any questions or concerns that you may have regarding the information given to you following your procedure. If we do not reach you, we will leave a message.  We will attempt to reach you two times.  During this call, we will ask if you have developed any symptoms of COVID 19. If you develop any symptoms (ie: fever, flu-like symptoms, shortness of breath, cough etc.) before then, please call [phone].  If you test positive for Covid 19 in the 2 weeks post procedure, please call and report this information to [place].    If any biopsies were taken you will be contacted by phone or by letter within the next 1-3 weeks.  Please call us at [phone] if you have not heard about the biopsies in 3 weeks.    SIGNATURES/CONFIDENTIALITY: You and/or your care partner have signed paperwork which will be entered into your electronic medical record.  These signatures attest to the fact that that the information above on your After Visit Summary has been reviewed and is understood.  Full responsibility of the confidentiality of this discharge information lies with you and/or your care-partner.

## 2019-10-15 NOTE — Progress Notes (Signed)
To PACU, VSS. Report to [email or web address] 

## 2019-10-15 NOTE — Progress Notes (Signed)
Pt's states no medical or surgical changes since previsit or office visit.  Vs by KA in adm  Temp & covid screen by Evelena Peat

## 2019-10-15 NOTE — Op Note (Addendum)
[facility] Patient Name: Walter [name] Procedure Date: 10/15/2019 11:06 AM MRN: [name] Endoscopist: Capron. Loletha Carrow , MD Age: 73 Referring MD:  Date of Birth: 21-Aug-1946 Gender: Male Account #: [id number] Procedure:                Colonoscopy Indications:              Screening for colorectal malignant neoplasm (no                            polyps last colonoscopy 10/2009) Medicines:                Monitored Anesthesia Care Procedure:                Pre-Anesthesia Assessment:                           - Prior to the procedure, a History and Physical                            was performed, and patient medications and                            allergies were reviewed. The patient's tolerance of                            previous anesthesia was also reviewed. The risks                            and benefits of the procedure and the sedation                            options and risks were discussed with the patient.                            All questions were answered, and informed consent                            was obtained. Prior Anticoagulants: The patient has                            taken no previous anticoagulant or antiplatelet                            agents except for aspirin. ASA Grade Assessment: II                            - A patient with mild systemic disease. After                            reviewing the risks and benefits, the patient was                            deemed in satisfactory condition to undergo the  procedure.                           After obtaining informed consent, the colonoscope                            was passed under direct vision. Throughout the                            procedure, the patient's blood pressure, pulse, and                            oxygen saturations were monitored continuously. The                            Colonoscope was introduced through the anus and                  advanced to the the cecum, identified by                            appendiceal orifice and ileocecal valve. The                            colonoscopy was performed without difficulty. The                            patient tolerated the procedure well. The quality                            of the bowel preparation was good. The ileocecal                            valve, appendiceal orifice, and rectum were                            photographed. Scope In: 11:18:37 AM Scope Out: 11:32:02 AM Scope Withdrawal Time: 0 hours 8 minutes 25 seconds  Total Procedure Duration: 0 hours 13 minutes 25 seconds  Findings:                 The perianal and digital rectal examinations were                            normal.                           Multiple diverticula were found in the entire colon.                           The exam was otherwise without abnormality on                            direct and retroflexion views. Complications:            No immediate complications. Estimated Blood Loss:     Estimated blood loss: none. Impression:               -  Diverticulosis in the entire examined colon.                           - The examination was otherwise normal on direct                            and retroflexion views.                           - No specimens collected. Recommendation:           - Patient has a contact number available for                            emergencies. The signs and symptoms of potential                            delayed complications were discussed with the                            patient. Return to normal activities tomorrow.                            Written discharge instructions were provided to the                            patient.                           - Resume previous diet.                           - Continue present medications.                           - Based on current guidelines, no repeat                             colonoscopy or other colon cancer screening                            necessary. Liliauna Santoni L. Loletha Carrow, MD 10/15/2019 11:36:37 AM This report has been signed electronically.

## 2019-10-17 ENCOUNTER — Telehealth: Payer: Self-pay | Admitting: *Deleted

## 2019-10-17 ENCOUNTER — Telehealth: Payer: Self-pay

## 2019-10-17 NOTE — Telephone Encounter (Signed)
Message left

## 2019-10-17 NOTE — Telephone Encounter (Signed)
No answer, left message to call back later today, B.Astor Gentle RN. 

## 2019-11-08 DIAGNOSIS — R69 Illness, unspecified: Secondary | ICD-10-CM | POA: Diagnosis not present

## 2019-11-15 ENCOUNTER — Telehealth: Payer: Self-pay | Admitting: Physician Assistant

## 2019-11-15 NOTE — Telephone Encounter (Signed)
Pt needs refills of supplies for his One Touch meter [facility] in [place]

## 2019-11-16 ENCOUNTER — Other Ambulatory Visit: Payer: Self-pay

## 2019-11-16 ENCOUNTER — Telehealth: Payer: Self-pay

## 2019-11-16 ENCOUNTER — Other Ambulatory Visit: Payer: Self-pay | Admitting: Emergency Medicine

## 2019-11-16 DIAGNOSIS — E1151 Type 2 diabetes mellitus with diabetic peripheral angiopathy without gangrene: Secondary | ICD-10-CM

## 2019-11-16 MED ORDER — ONETOUCH ULTRASOFT LANCETS MISC: 3 refills | Status: DC

## 2019-11-16 MED ORDER — ONETOUCH ULTRA 2 W/DEVICE KIT: 1.0000 | PACK | Freq: Every day | 0 refills | Status: DC

## 2019-11-16 MED ORDER — GLUCOSE BLOOD VI STRP: ORAL_STRIP | 3 refills | Status: DC

## 2019-11-16 NOTE — Telephone Encounter (Signed)
OK to send in lancets and One Touch test strips. Quant 100 of each with 3 refills. Dx E11.9. Use daily as directed to check glucose.

## 2019-11-16 NOTE — Telephone Encounter (Signed)
Patient requesting refill on supplies for one touch meter. I am not seeing any glucose monitor supplies on current medication list. Please advise

## 2019-11-16 NOTE — Telephone Encounter (Signed)
Scripts have been sent to pharmacy per PCP

## 2019-11-18 DIAGNOSIS — R69 Illness, unspecified: Secondary | ICD-10-CM | POA: Diagnosis not present

## 2019-11-20 ENCOUNTER — Other Ambulatory Visit: Payer: Self-pay | Admitting: Physician Assistant

## 2019-11-20 ENCOUNTER — Other Ambulatory Visit: Payer: Self-pay | Admitting: *Deleted

## 2019-11-20 DIAGNOSIS — E1151 Type 2 diabetes mellitus with diabetic peripheral angiopathy without gangrene: Secondary | ICD-10-CM

## 2019-11-20 DIAGNOSIS — R69 Illness, unspecified: Secondary | ICD-10-CM | POA: Diagnosis not present

## 2019-11-20 MED ORDER — GLUCOSE BLOOD VI STRP: ORAL_STRIP | 2 refills | Status: DC

## 2019-11-20 NOTE — Telephone Encounter (Signed)
One  touch test strip order, pt needs DM F/U [facility] in [place]

## 2019-11-24 ENCOUNTER — Other Ambulatory Visit: Payer: Self-pay | Admitting: Physician Assistant

## 2019-12-17 ENCOUNTER — Other Ambulatory Visit: Payer: Self-pay | Admitting: Physician Assistant

## 2019-12-17 NOTE — Telephone Encounter (Signed)
Requesting Refilll Rx Amlodipine 5mg  #90 -2ref  Last Refill on 09/19/2018 Last office visit 04/19/2019 for Preventive health examination

## 2020-01-08 DIAGNOSIS — R69 Illness, unspecified: Secondary | ICD-10-CM | POA: Diagnosis not present

## 2020-01-14 ENCOUNTER — Telehealth: Payer: Self-pay | Admitting: Neurology

## 2020-01-14 NOTE — Telephone Encounter (Signed)
Asking about medications for parkinson's stated that he would like to be started on it, pt has an appointment April 7th at 2 pm, pt has also had an increase in Drooling,

## 2020-01-14 NOTE — Telephone Encounter (Signed)
Patient's wife called in regarding Walter [name] and him getting worse with his Parkinson's Disease. Please Call. Thank you

## 2020-01-15 ENCOUNTER — Ambulatory Visit (INDEPENDENT_AMBULATORY_CARE_PROVIDER_SITE_OTHER): Payer: Medicare HMO | Admitting: Physician Assistant

## 2020-01-15 ENCOUNTER — Encounter: Payer: Self-pay | Admitting: Physician Assistant

## 2020-01-15 ENCOUNTER — Other Ambulatory Visit: Payer: Self-pay

## 2020-01-15 VITALS — BP 144/60 | HR 80

## 2020-01-15 DIAGNOSIS — E1151 Type 2 diabetes mellitus with diabetic peripheral angiopathy without gangrene: Secondary | ICD-10-CM

## 2020-01-15 DIAGNOSIS — J302 Other seasonal allergic rhinitis: Secondary | ICD-10-CM

## 2020-01-15 DIAGNOSIS — I10 Essential (primary) hypertension: Secondary | ICD-10-CM

## 2020-01-15 NOTE — Progress Notes (Signed)
I have discussed the procedure for the virtual visit with the patient who has given consent to proceed with assessment and treatment.   Zea Kostka S Betta Balla, CMA     

## 2020-01-15 NOTE — Progress Notes (Signed)
Virtual Visit via Telephone Note  I connected with Walter [name] on 01/15/20 at 10:00 AM EDT by telephone and verified that I am speaking with the correct person using two identifiers.  Location: Patient: Home Provider: [facility]   I discussed the limitations, risks, security and privacy concerns of performing an evaluation and management service by telephone and the availability of in person appointments. I also discussed with the patient that there may be a patient responsible charge related to this service. The patient expressed understanding and agreed to proceed.  History of Present Illness: Patient presents via phone today to follow-up regarding diabetes. Patient overdue for follow-up. Has been controlled previously on a regimen of Metformin. Endorses taking medications as directed and tolerating well. Is checking glucose periodically throughout the week. Notes fasting glucose averaging 105-126. Is walking the track daily, weather permitting. Endorses last eye examination in the fall with Dr. Leticia Clas at [facility] Will need records.   Patient also endorses mild rhinorrhea and post nasal drip over the past few days. Has history of seasonal allergies. Denies watery/itchy eyes. No sinus pain, ear pain or tooth pain. Denies fever or chills.   Observations/Objective: No labored breathing.  Speech is clear and coherent with logical content.  Patient is alert and oriented at baseline.   Assessment and Plan: 1. 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 Taking medications as directed. Eye exam and immunizations up-to-date. Will update foot examination at next in-office visit. Lab orders placed. Lab visit scheduled for Friday.  2. Essential hypertension BP slightly above goal. Continue current regimen. Will check manual BP at nurse visit this Friday.  3. Seasonal allergic rhinitis, unspecified trigger Start saline nasal  rinse. Put humidifier in the bedroom. Can use OTC nasal steroid. Follow-up if not resolving.    Follow Up Instructions:  I discussed the assessment and treatment plan with the patient. The patient was provided an opportunity to ask questions and all were answered. The patient agreed with the plan and demonstrated an understanding of the instructions.   The patient was advised to call back or seek an in-person evaluation if the symptoms worsen or if the condition fails to improve as anticipated.  I provided 15 minutes of non-face-to-face time during this encounter.   Leeanne Rio, PA-C

## 2020-01-18 ENCOUNTER — Telehealth: Payer: Self-pay | Admitting: Physician Assistant

## 2020-01-18 ENCOUNTER — Ambulatory Visit (INDEPENDENT_AMBULATORY_CARE_PROVIDER_SITE_OTHER): Payer: Medicare HMO

## 2020-01-18 ENCOUNTER — Other Ambulatory Visit: Payer: Self-pay

## 2020-01-18 DIAGNOSIS — I10 Essential (primary) hypertension: Secondary | ICD-10-CM

## 2020-01-18 DIAGNOSIS — E1151 Type 2 diabetes mellitus with diabetic peripheral angiopathy without gangrene: Secondary | ICD-10-CM

## 2020-01-18 LAB — COMPREHENSIVE METABOLIC PANEL
ALT: 20 U/L (ref 0–53)
AST: 19 U/L (ref 0–37)
Albumin: 4.2 g/dL (ref 3.5–5.2)
Alkaline Phosphatase: 93 U/L (ref 39–117)
BUN: 20 mg/dL (ref 6–23)
CO2: 28 mEq/L (ref 19–32)
Calcium: 9.5 mg/dL (ref 8.4–10.5)
Chloride: 102 mEq/L (ref 96–112)
Creatinine, Ser: 1.09 mg/dL (ref 0.40–1.50)
GFR: 66 mL/min (ref >60.00)
Glucose, Bld: 86 mg/dL (ref 70–99)
Potassium: 4 mEq/L (ref 3.5–5.1)
Sodium: 139 mEq/L (ref 135–145)
Total Bilirubin: 0.6 mg/dL (ref 0.2–1.2)
Total Protein: 7.1 g/dL (ref 6.0–8.3)

## 2020-01-18 LAB — LIPID PANEL
Cholesterol: 150 mg/dL (ref 0–200)
HDL: 44.1 mg/dL (ref >39.00)
LDL Cholesterol: 87 mg/dL (ref 0–99)
NonHDL: 106.06
Total CHOL/HDL Ratio: 3
Triglycerides: 95 mg/dL (ref 0.0–149.0)
VLDL: 19 mg/dL (ref 0.0–40.0)

## 2020-01-18 LAB — HEMOGLOBIN A1C: Hgb A1c MFr Bld: 6.3 % (ref 4.6–6.5)

## 2020-01-18 NOTE — Chronic Care Management (AMB) (Signed)
  Chronic Care Management   Note  01/18/2020 Name: Marlon Degraaf MRN: [id number] DOB: 03/14/1946  Gurney Maxin Klay is a 74 y.o. year old male who is a primary care patient of Delorse Limber. I reached out to [name] by phone today in response to a referral sent by Mr. Jai Tinkham Platt's PCP, Brunetta Jeans, PA-C.   Mr. Hoague was given information about Chronic Care Management services today including:  1. CCM service includes personalized support from designated clinical staff supervised by his physician, including individualized plan of care and coordination with other care providers 2. 24/7 contact phone numbers for assistance for urgent and routine care needs. 3. Service will only be billed when office clinical staff spend 20 minutes or more in a month to coordinate care. 4. Only one practitioner may furnish and bill the service in a calendar month. 5. The patient may stop CCM services at any time (effective at the end of the month) by phone call to the office staff.   Patient agreed to services and verbal consent obtained.   Follow up plan:   [place]

## 2020-01-21 ENCOUNTER — Ambulatory Visit: Payer: Medicare HMO

## 2020-01-21 ENCOUNTER — Other Ambulatory Visit: Payer: Self-pay

## 2020-01-21 DIAGNOSIS — E1151 Type 2 diabetes mellitus with diabetic peripheral angiopathy without gangrene: Secondary | ICD-10-CM

## 2020-01-21 DIAGNOSIS — R413 Other amnesia: Secondary | ICD-10-CM

## 2020-01-21 DIAGNOSIS — I10 Essential (primary) hypertension: Secondary | ICD-10-CM

## 2020-01-21 MED ORDER — DONEPEZIL HCL 10 MG PO TABS: ORAL_TABLET | ORAL | 11 refills | Status: DC

## 2020-01-21 NOTE — Patient Instructions (Addendum)
Visit Information  Walter [name] was given information about Chronic Care Management services today including:  1. CCM service includes personalized support from designated clinical staff supervised by his physician, including individualized plan of care and coordination with other care providers 2. 24/7 contact phone numbers for assistance for urgent and routine care needs. 3. Standard insurance, coinsurance, copays and deductibles apply for chronic care management only during months in which we provide at least 20 minutes of these services. Most insurances cover these services at 100%, however patients may be responsible for any copay, coinsurance and/or deductible if applicable. This service may help you avoid the need for more expensive face-to-face services. 4. Only one practitioner may furnish and bill the service in a calendar month. 5. The patient may stop CCM services at any time (effective at the end of the month) by phone call to the office staff.  Patient agreed to services and verbal consent obtained.   The patient verbalized understanding of instructions provided today and agreed to receive a mailed copy of patient instruction and/or educational materials.   Telephone follow up appointment with pharmacy team member scheduled for: 07/21/2020 at 10am  Verbal consent obtained for [facility] enhanced pharmacy services (medication synchronization, adherence packaging, delivery coordination). A medication sync plan was created to allow patient to get all medications delivered once every 30 to 90 days per patient preference. Patient understands they have freedom to choose pharmacy and clinical pharmacist will coordinate care between all prescribers and [facility].  [name], Pharm.D. Clinical Pharmacist [facility] at [facility] [phone]  Hypertension, Adult Hypertension is another name for high blood pressure. High blood pressure forces your  heart to work harder to pump blood. This can cause problems over time. There are two numbers in a blood pressure reading. There is a top number (systolic) over a bottom number (diastolic). It is best to have a blood pressure that is below 120/80. Healthy choices can help lower your blood pressure, or you may need medicine to help lower it. What are the causes? The cause of this condition is not known. Some conditions may be related to high blood pressure. What increases the risk?  Smoking.  Having type 2 diabetes mellitus, high cholesterol, or both.  Not getting enough exercise or physical activity.  Being overweight.  Having too much fat, sugar, calories, or salt (sodium) in your diet.  Drinking too much alcohol.  Having long-term (chronic) kidney disease.  Having a family history of high blood pressure.  Age. Risk increases with age.  Race. You may be at higher risk if you are African American.  Gender. Men are at higher risk than women before age 9. After age 27, women are at higher risk than men.  Having obstructive sleep apnea.  Stress. What are the signs or symptoms?  High blood pressure may not cause symptoms. Very high blood pressure (hypertensive crisis) may cause: ? Headache. ? Feelings of worry or nervousness (anxiety). ? Shortness of breath. ? Nosebleed. ? A feeling of being sick to your stomach (nausea). ? Throwing up (vomiting). ? Changes in how you see. ? Very bad chest pain. ? Seizures. How is this treated?  This condition is treated by making healthy lifestyle changes, such as: ? Eating healthy foods. ? Exercising more. ? Drinking less alcohol.  Your health care provider may prescribe medicine if lifestyle changes are not enough to get your blood pressure under control, and if: ? Your top number is above 130. ?  Your bottom number is above 80.  Your personal target blood pressure may vary. Follow these instructions at home: Eating and  drinking   If told, follow the DASH eating plan. To follow this plan: ? Fill one half of your plate at each meal with fruits and vegetables. ? Fill one fourth of your plate at each meal with whole grains. Whole grains include whole-wheat pasta, brown rice, and whole-grain bread. ? Eat or drink low-fat dairy products, such as skim milk or low-fat yogurt. ? Fill one fourth of your plate at each meal with low-fat (lean) proteins. Low-fat proteins include fish, chicken without skin, eggs, beans, and tofu. ? Avoid fatty meat, cured and processed meat, or chicken with skin. ? Avoid pre-made or processed food.  Eat less than 1,500 mg of salt each day.  Do not drink alcohol if: ? Your doctor tells you not to drink. ? You are pregnant, may be pregnant, or are planning to become pregnant.  If you drink alcohol: ? Limit how much you use to:  0-1 drink a day for women.  0-2 drinks a day for men. ? Be aware of how much alcohol is in your drink. In the [place] one drink equals one 12 oz bottle of beer (355 mL), one 5 oz glass of wine (148 mL), or one 1 oz glass of hard liquor (44 mL). Lifestyle   Work with your doctor to stay at a healthy weight or to lose weight. Ask your doctor what the best weight is for you.  Get at least 30 minutes of exercise most days of the week. This may include walking, swimming, or biking.  Get at least 30 minutes of exercise that strengthens your muscles (resistance exercise) at least 3 days a week. This may include lifting weights or doing Pilates.  Do not use any products that contain nicotine or tobacco, such as cigarettes, e-cigarettes, and chewing tobacco. If you need help quitting, ask your doctor.  Check your blood pressure at home as told by your doctor.  Keep all follow-up visits as told by your doctor. This is important. Medicines  Take over-the-counter and prescription medicines only as told by your doctor. Follow directions carefully.  Do not skip  doses of blood pressure medicine. The medicine does not work as well if you skip doses. Skipping doses also puts you at risk for problems.  Ask your doctor about side effects or reactions to medicines that you should watch for. Contact a doctor if you:  Think you are having a reaction to the medicine you are taking.  Have headaches that keep coming back (recurring).  Feel dizzy.  Have swelling in your ankles.  Have trouble with your vision. Get help right away if you:  Get a very bad headache.  Start to feel mixed up (confused).  Feel weak or numb.  Feel faint.  Have very bad pain in your: ? Chest. ? Belly (abdomen).  Throw up more than once.  Have trouble breathing. Summary  Hypertension is another name for high blood pressure.  High blood pressure forces your heart to work harder to pump blood.  For most people, a normal blood pressure is less than 120/80.  Making healthy choices can help lower blood pressure. If your blood pressure does not get lower with healthy choices, you may need to take medicine. This information is not intended to replace advice given to you by your health care provider. Make sure you discuss any questions you have with  your health care provider. Document Revised: 06/28/2018 Document Reviewed: 06/28/2018 Elsevier Patient Education  [place]. Visit Information  Goals Addressed            This Visit's Progress   . Blood Pressure < 140/90      . Patient Stated (pt-stated)       CARE PLAN ENTRY  Current Barriers:  . Chronic Disease Management support, education, and care coordination needs related to diabetes, hypertension  Pharmacist Clinical Goal(s):  Marland Kitchen Over the next 14 days, patient will demonstrate Improved medication adherence as evidenced by adherence packaging . Blood pressure <140/90  Interventions: . Comprehensive medication review performed. Marland Kitchen Collaboration with provider re: medication management  Patient  Self Care Activities:  . Patient verbalizes understanding of plan to continue to check blood pressure at home let us know if readings start trending above 140/90  Initial goal documentation         Telephone follow up appointment with pharmacy team member scheduled for: 9/20 10am  [name], Pharm.D. Clinical Pharmacist [facility] at [facility] [phone]

## 2020-01-21 NOTE — Chronic Care Management (AMB) (Signed)
Chronic Care Management Pharmacy  Name: Walter [name]  MRN: [id number] DOB: 1946-07-27  Chief Complaint/ HPI  CC: Chronic Care Management  Walter [name],  74 y.o. , male presents for their Initial CCM visit with the clinical pharmacist via telephone due to COVID-19 Pandemic.  PCP : Brunetta Jeans, PA-C  Their chronic conditions include: Diabetes, essential hypertension, memory impairment    Office Visits: -3/19: PCP. A1c down to 6.3; if continues to drop will reduce dose of metformin; start saline rinse and put humidifier in bedroom, use OTC nasal steroid if no improvement  Consult Visit: -12/7: Dr. Delice Lesch, neuro. Started on donepezil 38m 0.5 (half) tablet daily x 2wks then one tablet daily   Medications: Outpatient Encounter Medications as of 01/21/2020  Medication Sig Note  . amLODipine (NORVASC) 5 MG tablet Take 1 tablet (5 mg total) by mouth daily. Need follow-up for further refills.   .Marland Kitchenatorvastatin (LIPITOR) 40 MG tablet Take 1 tablet by mouth once daily   . atropine 1 % ophthalmic solution Use as instructed in your mouth   . Blood Glucose Monitoring Suppl (ONE TOUCH ULTRA 2) w/Device KIT 1 kit by Does not apply route daily.   . cimetidine (TAGAMET) 400 MG tablet Take 1 tablet (400 mg total) by mouth at bedtime. 04/19/2019: PRN  . cyanocobalamin 2000 MCG tablet Take 1 tablet (2,000 mcg total) by mouth daily.   . Dihydroxyaluminum Sod Carb (ROLAIDS PO) Take by mouth as needed. 04/19/2019: PRN  . donepezil (ARICEPT) 10 MG tablet Take 1/2 tablet daily for 2 weeks, then increase to 1 tablet daily   . fluticasone (FLONASE) 50 MCG/ACT nasal spray Place 2 sprays into both nostrils daily.   .Marland Kitchenglucose blood test strip Use as instructed   . glucose blood test strip Use to check glucose once daily as directed. E11.9   . Lancets (ONETOUCH ULTRASOFT) lancets Check blood sugars once daily. Dx: E11.51   . losartan (COZAAR) 100 MG tablet Take 1 tablet (100 mg total) by mouth  daily.   . metFORMIN (GLUCOPHAGE) 1000 MG tablet Take 1 tablet (1,000 mg total) by mouth 2 (two) times daily with a meal.   . OVER THE COUNTER MEDICATION Allergy relief, one tablet daily.   .Marland Kitchenaspirin 81 MG tablet Take 81 mg by mouth daily.   10/15/2019: Pt states he only takes once in a while  . OVER THE COUNTER MEDICATION Cognium  Memory tablet. One daily.    No facility-administered encounter medications on file as of 01/21/2020.   Current Diagnosis/Assessment:  Goals Addressed            This Visit's Progress   . Blood Pressure < 140/90      . Patient Stated (pt-stated)       CARE PLAN ENTRY  Current Barriers:  . Chronic Disease Management support, education, and care coordination needs related to diabetes, hypertension  Pharmacist Clinical Goal(s):  .Marland KitchenOver the next 14 days, patient will demonstrate Improved medication adherence as evidenced by adherence packaging . Blood pressure <140/90  Interventions: . Comprehensive medication review performed. .Marland KitchenCollaboration with provider re: medication management  Patient Self Care Activities:  . Patient verbalizes understanding of plan to continue to check blood pressure at home let [place] if readings start trending above 140/90  Initial goal documentation        Diabetes   Recent Relevant Labs: Lab Results  Component Value Date/Time   HGBA1C 6.3 01/18/2020 09:20 AM   HGBA1C  6.8 (H) 04/19/2019 09:56 AM   MICROALBUR <0.7 02/02/2018 10:01 AM   MICROALBUR <0.7 10/01/2016 08:48 AM    Checking BG: Daily Recent FBG Readings: Self reported: 126 (3/22), 117 (321), typical range 90-127 Patient is currently controlled on the following medications: metformin 1000 mg twice daily  Last diabetic eye exam:  Lab Results  Component Value Date/Time   HMDIABEYEEXA No Retinopathy 04/19/2017 12:00 AM    Last diabetic foot exam:  Lab Results  Component Value Date/Time   HMDIABFOOTEX done 03/13/2012 12:00 AM    Is very active  outdoors and likes to get out to the beach with family. Currently does lawn mowing, weed eating, trimming, etc, for himself and neighbors  We discussed: exercise extensively   Plan Continue current medications and exercise multiple times/week Hypertension  Office blood pressures are  BP Readings from Last 3 Encounters:  01/18/20 140/68  01/15/20 (!) 144/60  10/15/19 136/62  Pt is currently controlled on losartan 1103m daily and amlodipine 580mdaily   Patient checks BP at home weekly Patient home BP readings are ranging: 130-140/60s; using automatic BP system with arm cuff. Denies low blood pressure or feeling dizzy, no fall hx since >[id number] states systolic BP readings >14>132 occasion We discussed diet and exercise extensively  Plan Continue current medications; will let us [place] if readings start to trend above 140/90   Medication Management  Pt currently uses pill box for all medications. Is interested in use of pill packs and pharmacy delivery through [facility] for medication synchronization, packaging and delivery  Verbal consent obtained for [facility] enhanced pharmacy services (medication synchronization, adherence packaging, delivery coordination). A medication sync plan was created to allow patient to get all medications delivered once every 30 to 90 days per patient preference. Patient understands they have freedom to choose pharmacy and clinical pharmacist will coordinate care between all prescribers and [facility].  Follow up: 07/21/2020 10am telephone call  JacMadelin Rearharm.D. Clinical Pharmacist [phone]

## 2020-01-22 ENCOUNTER — Other Ambulatory Visit: Payer: Self-pay | Admitting: Emergency Medicine

## 2020-01-22 DIAGNOSIS — I10 Essential (primary) hypertension: Secondary | ICD-10-CM

## 2020-01-22 DIAGNOSIS — E785 Hyperlipidemia, unspecified: Secondary | ICD-10-CM

## 2020-01-22 DIAGNOSIS — E1151 Type 2 diabetes mellitus with diabetic peripheral angiopathy without gangrene: Secondary | ICD-10-CM

## 2020-01-22 MED ORDER — LOSARTAN POTASSIUM 100 MG PO TABS: 100.0000 mg | ORAL_TABLET | Freq: Every day | ORAL | 1 refills | Status: DC

## 2020-01-22 MED ORDER — AMLODIPINE BESYLATE 5 MG PO TABS: 5.0000 mg | ORAL_TABLET | Freq: Every day | ORAL | 0 refills | Status: DC

## 2020-01-22 MED ORDER — ATORVASTATIN CALCIUM 40 MG PO TABS: 40.0000 mg | ORAL_TABLET | Freq: Every day | ORAL | 1 refills | Status: DC

## 2020-01-22 MED ORDER — METFORMIN HCL 1000 MG PO TABS: 1000.0000 mg | ORAL_TABLET | Freq: Two times a day (BID) | ORAL | 1 refills | Status: DC

## 2020-01-22 NOTE — Telephone Encounter (Signed)
Will discuss on his follow-up. If drooling not helped by atropine drops, next step would be Botox.

## 2020-01-23 ENCOUNTER — Telehealth: Payer: Self-pay | Admitting: Physician Assistant

## 2020-01-23 NOTE — Telephone Encounter (Signed)
A referral needs to be placed for the pharmacist. Please enter and route to me.

## 2020-01-24 NOTE — Telephone Encounter (Signed)
Referral placed.

## 2020-02-06 ENCOUNTER — Other Ambulatory Visit: Payer: Self-pay

## 2020-02-06 ENCOUNTER — Encounter: Payer: Self-pay | Admitting: Neurology

## 2020-02-06 ENCOUNTER — Ambulatory Visit: Payer: Medicare HMO | Admitting: Neurology

## 2020-02-06 VITALS — BP 144/62 | HR 83 | Ht 70.0 in | Wt 148.2 lb

## 2020-02-06 DIAGNOSIS — G3184 Mild cognitive impairment, so stated: Secondary | ICD-10-CM

## 2020-02-06 DIAGNOSIS — G20 Parkinson's disease: Secondary | ICD-10-CM

## 2020-02-06 MED ORDER — DONEPEZIL HCL 10 MG PO TABS: ORAL_TABLET | ORAL | 3 refills | Status: DC

## 2020-02-06 NOTE — Patient Instructions (Signed)
1. Continue Donepezil 10mg  daily  2. We will start getting approval for Botox for the drooling  3. Recommend looking into Parkinson's exercise programs  4. Follow-up in 4 months, call for any changes  FALL PRECAUTIONS: Be cautious when walking. Scan the area for obstacles that may increase the risk of trips and falls. When getting up in the mornings, sit up at the edge of the bed for a few minutes before getting out of bed. Consider elevating the bed at the head end to avoid drop of blood pressure when getting up. Walk always in a well-lit room (use night lights in the walls). Avoid area rugs or power cords from appliances in the middle of the walkways. Use a walker or a cane if necessary and consider physical therapy for balance exercise. Get your eyesight checked regularly.  FINANCIAL OVERSIGHT: Supervision, especially oversight when making financial decisions or transactions is also recommended.  HOME SAFETY: Consider the safety of the kitchen when operating appliances like stoves, microwave oven, and blender. Consider having supervision and share cooking responsibilities until no longer able to participate in those. Accidents with firearms and other hazards in the house should be identified and addressed as well.  DRIVING: Regarding driving, in patients with progressive memory problems, driving will be impaired. We advise to have someone else do the driving if trouble finding directions or if minor accidents are reported. Independent driving assessment is available to determine safety of driving.  ABILITY TO BE LEFT ALONE: If patient is unable to contact 911 operator, consider using LifeLine, or when the need is there, arrange for someone to stay with patients. Smoking is a fire hazard, consider supervision or cessation. Risk of wandering should be assessed by caregiver and if detected at any point, supervision and safe proof recommendations should be instituted.  MEDICATION SUPERVISION:  Inability to self-administer medication needs to be constantly addressed. Implement a mechanism to ensure safe administration of the medications.  RECOMMENDATIONS FOR ALL PATIENTS WITH MEMORY PROBLEMS: 1. Continue to exercise (Recommend 30 minutes of walking everyday, or 3 hours every week) 2. Increase social interactions - continue going to [place] and enjoy social gatherings with friends and family 3. Eat healthy, avoid fried foods and eat more fruits and vegetables 4. Maintain adequate blood pressure, blood sugar, and blood cholesterol level. Reducing the risk of stroke and cardiovascular disease also helps promoting better memory. 5. Avoid stressful situations. Live a simple life and avoid aggravations. Organize your time and prepare for the next day in anticipation. 6. Sleep well, avoid any interruptions of sleep and avoid any distractions in the bedroom that may interfere with adequate sleep quality 7. Avoid sugar, avoid sweets as there is a strong link between excessive sugar intake, diabetes, and cognitive impairment The Mediterranean diet has been shown to help patients reduce the risk of progressive memory disorders and reduces cardiovascular risk. This includes eating fish, eat fruits and green leafy vegetables, nuts like almonds and hazelnuts, walnuts, and also use olive oil. Avoid fast foods and fried foods as much as possible. Avoid sweets and sugar as sugar use has been linked to worsening of memory function.  There is always a concern of gradual progression of memory problems. If this is the case, then we may need to adjust level of care according to patient needs. Support, both to the patient and caregiver, should then be put into place.
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
## 2020-02-07 DIAGNOSIS — E785 Hyperlipidemia, unspecified: Secondary | ICD-10-CM | POA: Diagnosis not present

## 2020-02-07 DIAGNOSIS — Z008 Encounter for other general examination: Secondary | ICD-10-CM | POA: Diagnosis not present

## 2020-02-07 DIAGNOSIS — Z7982 Long term (current) use of aspirin: Secondary | ICD-10-CM | POA: Diagnosis not present

## 2020-02-07 DIAGNOSIS — K219 Gastro-esophageal reflux disease without esophagitis: Secondary | ICD-10-CM | POA: Diagnosis not present

## 2020-02-07 DIAGNOSIS — H04129 Dry eye syndrome of unspecified lacrimal gland: Secondary | ICD-10-CM | POA: Diagnosis not present

## 2020-02-07 DIAGNOSIS — Z7722 Contact with and (suspected) exposure to environmental tobacco smoke (acute) (chronic): Secondary | ICD-10-CM | POA: Diagnosis not present

## 2020-02-07 DIAGNOSIS — I10 Essential (primary) hypertension: Secondary | ICD-10-CM | POA: Diagnosis not present

## 2020-02-07 DIAGNOSIS — E1151 Type 2 diabetes mellitus with diabetic peripheral angiopathy without gangrene: Secondary | ICD-10-CM | POA: Diagnosis not present

## 2020-02-07 DIAGNOSIS — I2510 Atherosclerotic heart disease of native coronary artery without angina pectoris: Secondary | ICD-10-CM | POA: Diagnosis not present

## 2020-02-07 DIAGNOSIS — R69 Illness, unspecified: Secondary | ICD-10-CM | POA: Diagnosis not present

## 2020-02-07 DIAGNOSIS — J309 Allergic rhinitis, unspecified: Secondary | ICD-10-CM | POA: Diagnosis not present

## 2020-02-08 ENCOUNTER — Encounter: Payer: Self-pay | Admitting: *Deleted

## 2020-02-08 NOTE — Progress Notes (Addendum)
[employer]   Mairon Licausi 02/07/2020  Benefit Investigation  [id number] Intake - Complete Referral Received (PAF and SR/SMN complete)  Requires PA initiated via covermymeds Faxed to [phone]  Reginal Lutes (Key: [id number]) - MRN: [name] Need help? Call us at [phone] Status Sent to [employer] Next Steps The plan will fax you a determination, typically within 1 to 5 business days.  How do I follow up? Drug Myobloc 5000UNIT/ML solution Form [employer] Specialty Medicare Part B Botulinum Toxins Injectable Medication Precertification Form Precertification Request for Botulinum Toxins Injectable Medication for Medicare Part B members. [phone]) [phone] [phone]

## 2020-02-11 ENCOUNTER — Encounter: Payer: Self-pay | Admitting: *Deleted

## 2020-02-11 ENCOUNTER — Telehealth: Payer: Self-pay | Admitting: Emergency Medicine

## 2020-02-11 DIAGNOSIS — E1151 Type 2 diabetes mellitus with diabetic peripheral angiopathy without gangrene: Secondary | ICD-10-CM

## 2020-02-11 DIAGNOSIS — R69 Illness, unspecified: Secondary | ICD-10-CM | POA: Diagnosis not present

## 2020-02-11 MED ORDER — ONETOUCH ULTRA VI STRP: ORAL_STRIP | 12 refills | Status: DC

## 2020-02-11 MED ORDER — ONETOUCH ULTRASOFT LANCETS MISC: 12 refills | Status: DC

## 2020-02-11 NOTE — Telephone Encounter (Signed)
-----   Message from [name], [facility] sent at 02/11/2020 10:17 AM EDT ----- Good morning, can you please send lancets and OneTouch test strips to [facility] for Mr. Walter [name]? Thank you. Edison Nasuti

## 2020-02-11 NOTE — Progress Notes (Signed)
Upon receiving the denial fax for Myobloc I called the number on the Letter Case: [id number]   [phone] spoke with Maddie she transferred me to one of their pharmacists Maudie Mercury who approved Xeomin buy and bill  Valid 02/08/2020-02/07/2021 An approval letter will be faxed to [place].

## 2020-02-11 NOTE — Progress Notes (Signed)
Reginal Lutes (Key: [id number]) - MRN: [id number] Need help? Call us at [phone] Status Sent to [place] April [facility] Next Steps The plan will fax you a determination, typically within 1 to 5 business days.  How do I follow up? Drug Myobloc 5000UNIT/ML solution Form [employer] Specialty Medicare Part B Botulinum Toxins Injectable Medication Precertification Form Precertification Request for Botulinum Toxins Injectable Medication for Medicare Part B members. [phone]) [phone]    Denied by [facility] [employer]  CASE: [id number]  Myobloc is not covered for new starts, unless the member meets any of the following: 1. Inadequate response to a trial of Xeomin 2. Intolerable adverse event to Xeomin 3. Xeomin is contraindicated for the member.  The requested medication is non-preferred. The preferred medication is Xeomin. Please note the preferred medication also requires precertification.

## 2020-02-14 DIAGNOSIS — J310 Chronic rhinitis: Secondary | ICD-10-CM | POA: Diagnosis not present

## 2020-02-14 DIAGNOSIS — R040 Epistaxis: Secondary | ICD-10-CM | POA: Diagnosis not present

## 2020-02-22 ENCOUNTER — Ambulatory Visit (INDEPENDENT_AMBULATORY_CARE_PROVIDER_SITE_OTHER): Payer: Medicare HMO | Admitting: Neurology

## 2020-02-22 ENCOUNTER — Other Ambulatory Visit: Payer: Self-pay

## 2020-02-22 DIAGNOSIS — K117 Disturbances of salivary secretion: Secondary | ICD-10-CM

## 2020-02-22 MED ORDER — INCOBOTULINUMTOXINA 100 UNITS IM SOLR
100.0000 [IU] | INTRAMUSCULAR | Status: DC
  Administered 2020-02-22: 100 [IU] via INTRAMUSCULAR

## 2020-02-22 NOTE — Procedures (Signed)
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  The patient understands that this warning states that there have been reported cases of the Botox extending beyond the injection site and creating adverse effects, similar to those of botulism. This included loss of strength, trouble walking, hoarseness, trouble saying words clearly, loss of bladder control, trouble breathing, trouble swallowing, diplopia, blurry vision and ptosis. Most of the distant spread of Botox was happening in patients, primarily children, who received medication for spasticity or for cervical dystonia. The patient expressed understanding and desire to proceed.   Injections  Location Left  Right Units Number of sites  Parotid (point 1 on picture) 30 30 60 1 per side  Submandibular (point 2) 20 20 40 1 per side  TOTAL UNITS:   100    Type of Toxin: Xeomin Discarded Units: 0  Needle drawback with each injection was free of blood. Pt tolerated procedure well without complications.   Reinjection is anticipated in 4 months.

## 2020-02-22 NOTE — Addendum Note (Signed)
Addended by: Ulice Brilliant T on: 02/22/2020 03:57 PM   Modules accepted: Orders

## 2020-02-23 ENCOUNTER — Other Ambulatory Visit: Payer: Self-pay | Admitting: Physician Assistant

## 2020-02-23 DIAGNOSIS — E785 Hyperlipidemia, unspecified: Secondary | ICD-10-CM

## 2020-02-26 ENCOUNTER — Ambulatory Visit: Payer: Self-pay

## 2020-02-27 ENCOUNTER — Telehealth: Payer: Self-pay

## 2020-02-27 NOTE — Telephone Encounter (Signed)
Noted. Thank you.   Leeanne Rio, PA-C
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
## 2020-03-04 ENCOUNTER — Ambulatory Visit (INDEPENDENT_AMBULATORY_CARE_PROVIDER_SITE_OTHER): Payer: Medicare HMO | Admitting: Physician Assistant

## 2020-03-04 ENCOUNTER — Encounter: Payer: Self-pay | Admitting: Physician Assistant

## 2020-03-04 ENCOUNTER — Other Ambulatory Visit: Payer: Self-pay

## 2020-03-04 DIAGNOSIS — R69 Illness, unspecified: Secondary | ICD-10-CM | POA: Diagnosis not present

## 2020-03-04 DIAGNOSIS — M542 Cervicalgia: Secondary | ICD-10-CM
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
## 2020-03-04 NOTE — Patient Instructions (Signed)
Instructions sent to MyChart

## 2020-03-04 NOTE — Progress Notes (Signed)
I have discussed the procedure for the virtual visit with the patient who has given consent to proceed with assessment and treatment.   Darshawn Boateng N Pebbles Zeiders, LPN    . 

## 2020-03-07 ENCOUNTER — Ambulatory Visit (HOSPITAL_COMMUNITY)
Admission: RE | Admit: 2020-03-07 | Discharge: 2020-03-07 | Disposition: A | Discharge status: Home / Self Care | Payer: Medicare HMO | Source: Ambulatory Visit | Attending: Physician Assistant | Admitting: Physician Assistant

## 2020-03-07 ENCOUNTER — Other Ambulatory Visit: Payer: Self-pay

## 2020-03-07 DIAGNOSIS — M542 Cervicalgia: Secondary | ICD-10-CM | POA: Diagnosis not present

## 2020-03-10 ENCOUNTER — Telehealth: Payer: Self-pay | Admitting: Physician Assistant

## 2020-03-10 NOTE — Telephone Encounter (Signed)
See result notes. Patient was given xray results.

## 2020-03-10 NOTE — Telephone Encounter (Signed)
Pt called in asking for results from the xray he had done on Friday . Please advise

## 2020-03-11 ENCOUNTER — Other Ambulatory Visit: Payer: Self-pay

## 2020-03-11 DIAGNOSIS — M542 Cervicalgia: Secondary | ICD-10-CM

## 2020-03-13 ENCOUNTER — Other Ambulatory Visit: Payer: Self-pay | Admitting: Physician Assistant

## 2020-03-13 DIAGNOSIS — E1151 Type 2 diabetes mellitus with diabetic peripheral angiopathy without gangrene: Secondary | ICD-10-CM

## 2020-03-14 DIAGNOSIS — R040 Epistaxis: Secondary | ICD-10-CM | POA: Diagnosis not present

## 2020-03-15 ENCOUNTER — Other Ambulatory Visit: Payer: Self-pay | Admitting: Physician Assistant

## 2020-03-15 DIAGNOSIS — I10 Essential (primary) hypertension: Secondary | ICD-10-CM

## 2020-03-17 ENCOUNTER — Telehealth: Payer: Self-pay | Admitting: Neurology

## 2020-03-17 NOTE — Telephone Encounter (Signed)
Staff message sent to [facility].

## 2020-03-17 NOTE — Telephone Encounter (Signed)
Patient called in letting Dr. Carles Collet know the botox he had did not seem to work.

## 2020-03-17 NOTE — Telephone Encounter (Signed)
Patient states his Botox is not working he gave it a chance but its not doing anything.

## 2020-03-17 NOTE — Telephone Encounter (Signed)
His insurance had denied myobloc which is what I wanted to use.  If he is agreeable, see if we can get that approved now and we will try that instead next time.

## 2020-03-17 NOTE — Telephone Encounter (Signed)
Tee, please let susan know so that she can work the appeal

## 2020-03-17 NOTE — Telephone Encounter (Signed)
Patient is agreeable with plan and wants to try Myobloc next time.

## 2020-03-18 ENCOUNTER — Other Ambulatory Visit: Payer: Self-pay

## 2020-03-18 ENCOUNTER — Encounter: Payer: Self-pay | Admitting: Family Medicine

## 2020-03-18 ENCOUNTER — Ambulatory Visit: Payer: Medicare HMO | Admitting: Family Medicine

## 2020-03-18 VITALS — BP 130/68 | HR 69 | Ht 70.0 in | Wt 145.4 lb

## 2020-03-18 DIAGNOSIS — G20 Parkinson's disease: Secondary | ICD-10-CM

## 2020-03-18 DIAGNOSIS — S161XXA Strain of muscle, fascia and tendon at neck level, initial encounter: Secondary | ICD-10-CM | POA: Diagnosis not present
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
## 2020-03-18 NOTE — Patient Instructions (Signed)
Thank you for coming in today. Plan for PT.  Let me know if nobody calls you for the appointment by next week.  If not better in about 4 weeks let me know and I will order an MRI.  If changing or worsening return sooner.   Ok to continue tylenol and heating pad.  Add TENS unit.   TENS UNIT: This is helpful for muscle pain and spasm.   Search and Purchase a TENS 7000 2nd edition at  www.[email or web address] or [email or web address] It should be less than $30.     TENS unit instructions: Do not shower or bathe with the unit on Turn the unit off before removing electrodes or batteries If the electrodes lose stickiness add a drop of water to the electrodes after they are disconnected from the unit and place on plastic sheet. If you continued to have difficulty, call the TENS unit company to purchase more electrodes. Do not apply lotion on the skin area prior to use. Make sure the skin is clean and dry as this will help prolong the life of the electrodes. After use, always check skin for unusual red areas, rash or other skin difficulties. If there are any skin problems, does not apply electrodes to the same area. Never remove the electrodes from the unit by pulling the wires. Do not use the TENS unit or electrodes other than as directed. Do not change electrode placement without consultating your therapist or physician. Keep 2 fingers with between each electrode. Wear time ratio is 2:1, on to off times.    For example on for 30 minutes off for 15 minutes and then on for 30 minutes off for 15 minutes

## 2020-03-20 ENCOUNTER — Encounter: Payer: Self-pay | Admitting: *Deleted

## 2020-03-20 NOTE — Progress Notes (Signed)
Reginal Lutes (KeyRosana Fret) - MRN: [id number] Need help? Call us at [phone] Status Sent to [employer] The plan will fax you a determination, typically within 1 to 5 business days.  How do I follow up? Drug Myobloc 5000UNIT/ML solution Form [employer] Special[employer] B Botulinum Toxins Injectable Medication Precertification Form Precertification Request for Botulinum Toxins Injectable Medication for Medicare Part B members. [phone]

## 2020-03-22 ENCOUNTER — Other Ambulatory Visit: Payer: Self-pay | Admitting: Physician Assistant

## 2020-03-22 DIAGNOSIS — I10 Essential (primary) hypertension: Secondary | ICD-10-CM

## 2020-03-27 NOTE — Progress Notes (Signed)
Xeomin did not work so requested Myobloc PA again

## 2020-03-27 NOTE — Progress Notes (Signed)
Received approval for 5000Units Myobloc from [employer] valid 03/20/2020-03/20/2021  4 visits

## 2020-03-28 DIAGNOSIS — X58XXXD Exposure to other specified factors, subsequent encounter: Secondary | ICD-10-CM | POA: Diagnosis not present

## 2020-03-28 DIAGNOSIS — S161XXD Strain of muscle, fascia and tendon at neck level, subsequent encounter: Secondary | ICD-10-CM | POA: Diagnosis not present

## 2020-04-08 DIAGNOSIS — S161XXD Strain of muscle, fascia and tendon at neck level, subsequent encounter: Secondary | ICD-10-CM | POA: Diagnosis not present

## 2020-04-08 DIAGNOSIS — X58XXXD Exposure to other specified factors, subsequent encounter: Secondary | ICD-10-CM | POA: Diagnosis not present

## 2020-04-09 ENCOUNTER — Ambulatory Visit: Payer: Medicare HMO

## 2020-04-11 DIAGNOSIS — R040 Epistaxis: Secondary | ICD-10-CM | POA: Diagnosis not present

## 2020-04-14 ENCOUNTER — Other Ambulatory Visit: Payer: Self-pay

## 2020-04-14 ENCOUNTER — Ambulatory Visit (INDEPENDENT_AMBULATORY_CARE_PROVIDER_SITE_OTHER): Payer: Medicare HMO

## 2020-04-14 ENCOUNTER — Telehealth: Payer: Self-pay

## 2020-04-14 VITALS — BP 116/60 | HR 74 | Temp 98.4°F | Resp 16 | Ht 69.0 in | Wt 145.8 lb

## 2020-04-14 DIAGNOSIS — Z0000 Encounter for general adult medical examination without abnormal findings: Secondary | ICD-10-CM | POA: Diagnosis not present

## 2020-04-14 DIAGNOSIS — E1151 Type 2 diabetes mellitus with diabetic peripheral angiopathy without gangrene: Secondary | ICD-10-CM

## 2020-04-14 MED ORDER — METFORMIN HCL 1000 MG PO TABS: 1000.0000 mg | ORAL_TABLET | Freq: Two times a day (BID) | ORAL | 1 refills | Status: DC

## 2020-04-14 NOTE — Telephone Encounter (Signed)
Rx refill sent to requested pharmacy.

## 2020-04-14 NOTE — Patient Instructions (Signed)
Mr. Walter [name] , Thank you for taking time to come for your Medicare Wellness Visit. I appreciate your ongoing commitment to your health goals. Please review the following plan we discussed and let me know if I can assist you in the future.   Screening recommendations/referrals: Colonoscopy: 10/15/2019. No longer indicated Recommended yearly ophthalmology/optometry visit for glaucoma screening and checkup Recommended yearly dental visit for hygiene and checkup  Vaccinations: Influenza vaccine: Due 07/2020 Pneumococcal vaccine: Completed 04/02/2014 Tdap vaccine: Completed 03/16/2012-Due 2023 Shingles vaccine: Discuss with pharmacy  Covid-19: Completed 01/10/2020  Advanced directives: Bring a copy to next office visit  Conditions/risks identified: See problem list  Next appointment: Follow up in one year for your annual wellness visit.   Preventive Care 75 Years and Older, Male Preventive care refers to lifestyle choices and visits with your health care provider that can promote health and wellness. What does preventive care include?  A yearly physical exam. This is also called an annual well check.  Dental exams once or twice a year.  Routine eye exams. Ask your health care provider how often you should have your eyes checked.  Personal lifestyle choices, including:  Daily care of your teeth and gums.  Regular physical activity.  Eating a healthy diet.  Avoiding tobacco and drug use.  Limiting alcohol use.  Practicing safe sex.  Taking low doses of aspirin every day.  Taking vitamin and mineral supplements as recommended by your health care provider. What happens during an annual well check? The services and screenings done by your health care provider during your annual well check will depend on your age, overall health, lifestyle risk factors, and family history of disease. Counseling  Your health care provider may ask you questions about your:  Alcohol use.  Tobacco  use.  Drug use.  Emotional well-being.  Home and relationship well-being.  Sexual activity.  Eating habits.  History of falls.  Memory and ability to understand (cognition).  Work and work Statistician. Screening  You may have the following tests or measurements:  Height, weight, and BMI.  Blood pressure.  Lipid and cholesterol levels. These may be checked every 5 years, or more frequently if you are over 25 years old.  Skin check.  Lung cancer screening. You may have this screening every year starting at age 75 if you have a 30-pack-year history of smoking and currently smoke or have quit within the past 15 years.  Fecal occult blood test (FOBT) of the stool. You may have this test every year starting at age 75.  Flexible sigmoidoscopy or colonoscopy. You may have a sigmoidoscopy every 5 years or a colonoscopy every 10 years starting at age 75.  Prostate cancer screening. Recommendations will vary depending on your family history and other risks.  Hepatitis C blood test.  Hepatitis B blood test.  Sexually transmitted disease (STD) testing.  Diabetes screening. This is done by checking your blood sugar (glucose) after you have not eaten for a while (fasting). You may have this done every 1-3 years.  Abdominal aortic aneurysm (AAA) screening. You may need this if you are a current or former smoker.  Osteoporosis. You may be screened starting at age 75 if you are at high risk. Talk with your health care provider about your test results, treatment options, and if necessary, the need for more tests. Vaccines  Your health care provider may recommend certain vaccines, such as:  Influenza vaccine. This is recommended every year.  Tetanus, diphtheria, and acellular pertussis (Tdap,  Td) vaccine. You may need a Td booster every 10 years.  Zoster vaccine. You may need this after age 75  Pneumococcal 13-valent conjugate (PCV13) vaccine. One dose is recommended after age  75.  Pneumococcal polysaccharide (PPSV23) vaccine. One dose is recommended after age 75. Talk to your health care provider about which screenings and vaccines you need and how often you need them. This information is not intended to replace advice given to you by your health care provider. Make sure you discuss any questions you have with your health care provider. Document Released: 11/14/2015 Document Revised: 07/07/2016 Document Reviewed: 08/19/2015 Elsevier Interactive Patient Education  2017 [place] Prevention in the Home Falls can cause injuries. They can happen to people of all ages. There are many things you can do to make your home safe and to help prevent falls. What can I do on the outside of my home?  Regularly fix the edges of walkways and driveways and fix any cracks.  Remove anything that might make you trip as you walk through a door, such as a raised step or threshold.  Trim any bushes or trees on the path to your home.  Use bright outdoor lighting.  Clear any walking paths of anything that might make someone trip, such as rocks or tools.  Regularly check to see if handrails are loose or broken. Make sure that both sides of any steps have handrails.  Any raised decks and porches should have guardrails on the edges.  Have any leaves, snow, or ice cleared regularly.  Use sand or salt on walking paths during winter.  Clean up any spills in your garage right away. This includes oil or grease spills. What can I do in the bathroom?  Use night lights.  Install grab bars by the toilet and in the tub and shower. Do not use towel bars as grab bars.  Use non-skid mats or decals in the tub or shower.  If you need to sit down in the shower, use a plastic, non-slip stool.  Keep the floor dry. Clean up any water that spills on the floor as soon as it happens.  Remove soap buildup in the tub or shower regularly.  Attach bath mats securely with double-sided  non-slip rug tape.  Do not have throw rugs and other things on the floor that can make you trip. What can I do in the bedroom?  Use night lights.  Make sure that you have a light by your bed that is easy to reach.  Do not use any sheets or blankets that are too big for your bed. They should not hang down onto the floor.  Have a firm chair that has side arms. You can use this for support while you get dressed.  Do not have throw rugs and other things on the floor that can make you trip. What can I do in the kitchen?  Clean up any spills right away.  Avoid walking on wet floors.  Keep items that you use a lot in easy-to-reach places.  If you need to reach something above you, use a strong step stool that has a grab bar.  Keep electrical cords out of the way.  Do not use floor polish or wax that makes floors slippery. If you must use wax, use non-skid floor wax.  Do not have throw rugs and other things on the floor that can make you trip. What can I do with my stairs?  Do not  leave any items on the stairs.  Make sure that there are handrails on both sides of the stairs and use them. Fix handrails that are broken or loose. Make sure that handrails are as long as the stairways.  Check any carpeting to make sure that it is firmly attached to the stairs. Fix any carpet that is loose or worn.  Avoid having throw rugs at the top or bottom of the stairs. If you do have throw rugs, attach them to the floor with carpet tape.  Make sure that you have a light switch at the top of the stairs and the bottom of the stairs. If you do not have them, ask someone to add them for you. What else can I do to help prevent falls?  Wear shoes that:  Do not have high heels.  Have rubber bottoms.  Are comfortable and fit you well.  Are closed at the toe. Do not wear sandals.  If you use a stepladder:  Make sure that it is fully opened. Do not climb a closed stepladder.  Make sure that both  sides of the stepladder are locked into place.  Ask someone to hold it for you, if possible.  Clearly mark and make sure that you can see:  Any grab bars or handrails.  First and last steps.  Where the edge of each step is.  Use tools that help you move around (mobility aids) if they are needed. These include:  Canes.  Walkers.  Scooters.  Crutches.  Turn on the lights when you go into a dark area. Replace any light bulbs as soon as they burn out.  Set up your furniture so you have a clear path. Avoid moving your furniture around.  If any of your floors are uneven, fix them.  If there are any pets around you, be aware of where they are.  Review your medicines with your doctor. Some medicines can make you feel dizzy. This can increase your chance of falling. Ask your doctor what other things that you can do to help prevent falls. This information is not intended to replace advice given to you by your health care provider. Make sure you discuss any questions you have with your health care provider. Document Released: 08/14/2009 Document Revised: 03/25/2016 Document Reviewed: 11/22/2014 Elsevier Interactive Patient Education  2017 [employer].

## 2020-04-14 NOTE — Progress Notes (Signed)
Subjective:   Walter [name] is a 74 y.o. male who presents for an Initial Medicare Annual Wellness Visit.  Review of Systems   Cardiac Risk Factors include: advanced age (>56mn, >>67women);diabetes mellitus;dyslipidemia;male gender;hypertension    Objective:    Today's Vitals   04/14/20 1449  BP: 116/60  Pulse: 74  Resp: 16  Temp: 98.4 F (36.9 C)  SpO2: 96%  Weight: 145 lb 12.8 oz (66.1 kg)  Height: _0  (1.753 m)   Body mass index is 21.53 kg/m.  Advanced Directives 04/14/2020 02/06/2020 10/08/2019 04/19/2019 01/06/2019 06/07/2012  Does Patient Have a Medical Advance Directive? Yes No Yes Yes No Patient does not have advance directive  Type of Advance Directive H[place] will - - Living will - -  Copy of HGreeleyin Chart? No - copy requested - - - - -  Would patient like information on creating a medical advance directive? - - - - No - Patient declined -    Current Medications (verified) Outpatient Encounter Medications as of 04/14/2020  Medication Sig  . amLODipine (NORVASC) 5 MG tablet Take 1 tablet (5 mg total) by mouth daily.  .Marland Kitchenaspirin 81 MG tablet Take 81 mg by mouth daily.    .Marland Kitchenatorvastatin (LIPITOR) 40 MG tablet Take 1 tablet by mouth once daily  . Blood Glucose Monitoring Suppl (ONE TOUCH ULTRA 2) w/Device KIT 1 kit by Does not apply route daily.  . cimetidine (TAGAMET) 400 MG tablet Take 1 tablet (400 mg total) by mouth at bedtime.  . cyanocobalamin 2000 MCG tablet Take 1 tablet (2,000 mcg total) by mouth daily.  . Dihydroxyaluminum Sod Carb (ROLAIDS PO) Take by mouth as needed.  . donepezil (ARICEPT) 10 MG tablet Take 1 tablet daily  . fluticasone (FLONASE) 50 MCG/ACT nasal spray Place 2 sprays into both nostrils daily.  .Marland Kitchenglucose blood (ONETOUCH ULTRA) test strip Check blood sugars daily Dx:E11.51  . Lancets (ONETOUCH ULTRASOFT) lancets Check blood sugars once daily. Dx: E11.51  . losartan (COZAAR) 100 MG  tablet Take 1 tablet by mouth once daily  . metFORMIN (GLUCOPHAGE) 1000 MG tablet Take 1 tablet (1,000 mg total) by mouth 2 (two) times daily with a meal.  . OVER THE COUNTER MEDICATION Allergy relief, one tablet daily.  .Marland Kitchenatropine 1 % ophthalmic solution Use as instructed in your mouth (Patient not taking: Reported on 04/14/2020)   Facility-Administered Encounter Medications as of 04/14/2020  Medication  . incobotulinumtoxinA (XEOMIN) 100 units injection 100 Units    Allergies (verified) Patient has no known allergies.   History: Past Medical History:  Diagnosis Date  . Allergy   . Cancer ([facility]    Skin cancer  . Cataract   . Coronary artery disease   . Diabetes mellitus   . Diverticulosis   . Esophageal stricture   . GERD (gastroesophageal reflux disease)   . Hyperlipidemia   . Hypertension   . Peripheral vascular disease ([facility]    Past Surgical History:  Procedure Laterality Date  . CORONARY ANGIOPLASTY WITH STENT PLACEMENT    . ESOPHAGOGASTRODUODENOSCOPY  06/07/2012   Procedure: ESOPHAGOGASTRODUODENOSCOPY (EGD);  Surgeon: DLafayette Dragon MD;  [facility];  Service: Endoscopy;  Laterality: N/A;  . SHOULDER SURGERY  2006   Family History  Problem Relation Age of Onset  . Breast cancer Mother   . Healthy Son   . Healthy Son   . Parkinson's disease Sister   . Colon cancer Neg Hx   .  Esophageal cancer Neg Hx   . Rectal cancer Neg Hx   . Stomach cancer Neg Hx    Social History   Socioeconomic History  . Marital status: Married    Spouse name: Not on file  . Number of children: 3  . Years of education: Not on file  . Highest education level: Not on file  Occupational History  . Occupation: Auditor  Tobacco Use  . Smoking status: Former Smoker    Quit date: 11/02/1999    Years since quitting: 20.4  . Smokeless tobacco: Never Used  Vaping Use  . Vaping Use: Never used  Substance and Sexual Activity  . Alcohol use: No  . Drug use: No  . Sexual activity:  Not Currently    Partners: Female  Other Topics Concern  . Not on file  Social History Narrative   Daily caffeine       Right handed      Lives with wife   Social Determinants of Health   Financial Resource Strain: Low Risk   . Difficulty of Paying Living Expenses: Not hard at all  Food Insecurity: No Food Insecurity  . Worried About Charity fundraiser in the Last Year: Never true  . Ran Out of Food in the Last Year: Never true  Transportation Needs: No Transportation Needs  . Lack of Transportation (Medical): No  . Lack of Transportation (Non-Medical): No  Physical Activity: Insufficiently Active  . Days of Exercise per Week: 3 days  . Minutes of Exercise per Session: 10 min  Stress: No Stress Concern Present  . Feeling of Stress : Not at all  Social Connections: Socially Integrated  . Frequency of Communication with Friends and Family: More than three times a week  . Frequency of Social Gatherings with Friends and Family: Once a week  . Attends Religious Services: More than 4 times per year  . Active Member of Clubs or Organizations: Yes  . Attends Archivist Meetings: More than 4 times per year  . Marital Status: Married   Tobacco Counseling Counseling given: No   Clinical Intake:  Pre-visit preparation completed: Yes  Pain : No/denies pain     Nutritional Status: BMI of 19-24  Normal Nutritional Risks: None Diabetes: Yes CBG done?: Yes (135 this morning before breakfast) CBG resulted in Enter/ Edit results?: No Did pt. bring in CBG monitor from home?: No  How often do you need to have someone help you when you read instructions, pamphlets, or other written materials from your doctor or pharmacy?: 1 - Never  Nutrition Risk Assessment:  Has the patient had any N/V/D within the last 2 months?  No  Does the patient have any non-healing wounds?  No  Has the patient had any unintentional weight loss or weight gain?  No   Diabetes:  Is the  patient diabetic?  Yes  If diabetic, was a CBG obtained today?  Yes -135 per patient Did the patient bring in their glucometer from home?  No  How often do you monitor your CBG's? daily.   Financial Strains and Diabetes Management:  Are you having any financial strains with the device, your supplies or your medication? No .  Does the patient want to be seen by Chronic Care Management for management of their diabetes?  No  Would the patient like to be referred to a Nutritionist or for Diabetic Management?  No   Diabetic Exams:  Diabetic Eye Exam: Completed 2020 per patient Overdue  for diabetic eye exam. Pt has been advised about the importance in completing this exam. Patient states he has an upcoming eye appt within the next couple of months but is unsure of the date.  Diabetic  Pt has been advised about the importance in completing this exam.To be completed at next office visit with PCP  Interpreter Needed?: No  Information entered by :: Caroleen Hamman LPN  Activities of Daily Living In your present state of health, do you have any difficulty performing the following activities: 04/14/2020 04/19/2019  Hearing? N N  Vision? N N  Difficulty concentrating or making decisions? N N  Walking or climbing stairs? N N  Dressing or bathing? N N  Doing errands, shopping? N N  Preparing Food and eating ? - N  Using the Toilet? N N  In the past six months, have you accidently leaked urine? N N  Do you have problems with loss of bowel control? N N  Managing your Medications? N N  Managing your Finances? N N  Housekeeping or managing your Housekeeping? N N  Some recent data might be hidden     Immunizations and Health Maintenance Immunization History  Administered Date(s) Administered  . Influenza Split 08/16/2011  . Influenza Whole 08/01/2006, 07/31/2008, 09/17/2009, 07/15/2010  . Influenza, High Dose Seasonal PF 10/02/2014, 12/11/2015, 10/06/2016, 08/04/2017, 07/20/2018, 07/28/2019  .  Influenza,inj,Quad PF,6+ Mos 10/02/2013  . Moderna SARS-COVID-2 Vaccination 12/13/2019, 01/10/2020  . Pneumococcal Conjugate-13 04/02/2014  . Pneumococcal Polysaccharide-23 08/01/2006, 03/12/2011  . Td 03/16/2012  . Tdap 03/16/2012  . Zoster 10/11/2011   Health Maintenance Due  Topic Date Due  . OPHTHALMOLOGY EXAM  06/26/2019  . FOOT EXAM  07/26/2019    Patient Care Team: Delorse Limber as PCP - General (Family Medicine) Cameron Sprang, MD as Consulting Physician (Neurology) Leticia Clas, [place] (Optometry) [name], [facility] as Pharmacist (Pharmacist)  Indicate any recent Medical Services you may have received from other than [facility] providers in the past year (date may be approximate).    Assessment:   This is a routine wellness examination for Ashad.  Hearing/Vision screen  Hearing Screening   _0  _1  _2  _3  _4  _5  _6  _7  _8   Right ear:           Left ear:           Comments: No issues  Vision Screening Comments: Occasionally wears reading glasses  Dietary issues and exercise activities discussed: Current Exercise Habits: Home exercise routine, Type of exercise: strength training/weights, Time (Minutes): 15, Frequency (Times/Week): 3, Weekly Exercise (Minutes/Week): 45  Goals Addressed            This Visit's Progress   . Patient Stated       Maintain current health      Depression Screen PHQ 2/9 Scores 04/14/2020 04/19/2019 02/02/2018 01/31/2017  PHQ - 2 Score 0 0 0 0  PHQ- 9 Score - 0 - -    Fall Risk Fall Risk  04/14/2020 02/06/2020 10/08/2019 06/08/2019 02/02/2018  Falls in the past year? 1 0 0 0 No  Number falls in past yr: 0 0 - 0 -  Injury with Fall? 0 0 - 0 -  Follow up Falls evaluation completed - - Falls evaluation completed -    FALL RISK PREVENTION PERTAINING TO THE HOME:  Any stairs in or around the home? No    Home free of loose throw rugs in walkways, pet beds, electrical cords, etc? Yes  Adequate lighting in  your home to reduce risk of falls? Yes   ASSISTIVE DEVICES UTILIZED TO PREVENT FALLS:  Life alert? No  Use of a cane, walker or w/c? No  Grab bars in the bathroom? Yes  Shower chair or bench in shower? No  Elevated toilet seat or a handicapped toilet? No    TIMED UP AND GO:  Was the test performed? Yes .  Length of time to ambulate 10 feet: 11 sec.   GAIT:  Appearance of gait: Gait slow and steady without the use of an assistive device.  Education: Fall risk prevention has been discussed.  Intervention(s) required? Yes   DME/home health order needed?  No    Cognitive Function: MMSE - Mini Mental State Exam 04/19/2019  Orientation to time 5  Orientation to Place 5  Registration 3  Attention/ Calculation 3  Recall 3  Language- name 2 objects 2  Language- repeat 1  Language- follow 3 step command 3  Language- read & follow direction 1  Write a sentence 1  Copy design 1  Total score 28   Montreal Cognitive Assessment  10/08/2019 06/08/2019  Visuospatial/ Executive (0/5) - 2  Naming (0/3) - 2  Attention: Read list of digits (0/2) 1 2  Attention: Read list of letters (0/1) 1 1  Attention: Serial 7 subtraction starting at 100 (0/3) 3 3  Language: Repeat phrase (0/2) 1 1  Language : Fluency (0/1) 0 0  Abstraction (0/2) 1 0  Delayed Recall (0/5) 0 3  Orientation (0/6) 6 6  Total - 20  Adjusted Score (based on education) - 21   6CIT Screen 04/14/2020  What Year? 4 points  What month? 0 points  What time? 0 points  Count back from 20 0 points  Months in reverse 2 points  Repeat phrase 0 points  Total Score 6  Patient states he watches Wheel of [employer] daily & plays along.  Screening Tests Health Maintenance  Topic Date Due  . OPHTHALMOLOGY EXAM  06/26/2019  . FOOT EXAM  07/26/2019  . DTAP VACCINES (1) 04/14/2021 (Originally 07/02/1946)  . INFLUENZA VACCINE  06/01/2020  . HEMOGLOBIN A1C  07/20/2020  . DTaP/Tdap/Td (2 - Td or Tdap) 03/16/2022  . TETANUS/TDAP   03/16/2022  . COVID-19 Vaccine  Completed  . Hepatitis C Screening  Completed  . PNA vac Low Risk Adult  Completed    Qualifies for Shingles Vaccine? Yes  Zostavax completed 10/11/2011. Due for Shingrix. Education has been provided regarding the importance of this vaccine. Pt has been advised to call insurance company to determine out of pocket expense. Advised may also receive vaccine at local pharmacy or Health Dept. Verbalized acceptance and understanding.  Tdap: Up to date -completed 03/16/2012  Flu Vaccine: Due 07/2020  Pneumococcal Vaccine:  Completed Pnuemovax-23 03/12/2011 Completed Prevnar-13 04/02/2014   Covid-19:  Completed vaccines 01/10/2020  Cancer Screenings:  Colorectal Screening: Completed 10/15/2019.  No longer required.   Lung Cancer Screening: (Low Dose CT Chest recommended if Age 20-80 years, 30 pack-year currently smoking OR have quit w/in 15years.) does not qualify.     Additional Screening:  Hepatitis C Screening: Completed 02/02/2018  Vision Screening: Recommended annual ophthalmology exams for early detection of glaucoma and other disorders of the eye. Is the patient up to date with their annual eye exam?  No  Who is the provider or what is the name of the office in which the pt attends annual eye exams? Dr. Rosana Hoes I  Dental Screening: Recommended annual dental  exams for proper oral hygiene  Community Resource Referral:  CRR required this visit?  No        Plan:  I have personally reviewed and addressed the Medicare Annual Wellness questionnaire and have noted the following in the patient's chart:  A. Medical and social history B. Use of alcohol, tobacco or illicit drugs  C. Current medications and supplements D. Functional ability and status E.  Nutritional status F.  Physical activity G. Advance directives H. List of other physicians I.  Hospitalizations, surgeries, and ER visits in previous 12 months J.  [place] such as hearing  and vision if needed, cognitive and depression L. Referrals and appointments   In addition, I have reviewed and discussed with patient certain preventive protocols, quality metrics, and best practice recommendations. A written personalized care plan for preventive services as well as general preventive health recommendations were provided to patient.   Signed,    Marta Antu, LPN   [id number]  Nurse Health Advisor    Nurse Notes:

## 2020-04-15 DIAGNOSIS — S161XXD Strain of muscle, fascia and tendon at neck level, subsequent encounter: Secondary | ICD-10-CM | POA: Diagnosis not present

## 2020-04-15 DIAGNOSIS — X58XXXD Exposure to other specified factors, subsequent encounter: Secondary | ICD-10-CM | POA: Diagnosis not present

## 2020-04-22 DIAGNOSIS — R69 Illness, unspecified: Secondary | ICD-10-CM | POA: Diagnosis not present

## 2020-04-22 DIAGNOSIS — S161XXD Strain of muscle, fascia and tendon at neck level, subsequent encounter: Secondary | ICD-10-CM | POA: Diagnosis not present

## 2020-04-22 DIAGNOSIS — X58XXXD Exposure to other specified factors, subsequent encounter: Secondary | ICD-10-CM | POA: Diagnosis not present

## 2020-04-29 DIAGNOSIS — S161XXD Strain of muscle, fascia and tendon at neck level, subsequent encounter: Secondary | ICD-10-CM | POA: Diagnosis not present

## 2020-04-29 DIAGNOSIS — X58XXXD Exposure to other specified factors, subsequent encounter: Secondary | ICD-10-CM | POA: Diagnosis not present

## 2020-05-06 DIAGNOSIS — X58XXXD Exposure to other specified factors, subsequent encounter: Secondary | ICD-10-CM | POA: Diagnosis not present

## 2020-05-06 DIAGNOSIS — S161XXD Strain of muscle, fascia and tendon at neck level, subsequent encounter: Secondary | ICD-10-CM | POA: Diagnosis not present

## 2020-05-13 DIAGNOSIS — R69 Illness, unspecified: Secondary | ICD-10-CM | POA: Diagnosis not present

## 2020-06-10 ENCOUNTER — Other Ambulatory Visit: Payer: Self-pay

## 2020-06-10 ENCOUNTER — Other Ambulatory Visit: Payer: Self-pay | Admitting: Physician Assistant

## 2020-06-10 ENCOUNTER — Ambulatory Visit: Payer: Medicare HMO | Admitting: Neurology

## 2020-06-10 ENCOUNTER — Encounter: Payer: Self-pay | Admitting: Neurology

## 2020-06-10 VITALS — BP 143/61 | HR 76 | Ht 69.0 in | Wt 142.0 lb

## 2020-06-10 DIAGNOSIS — R69 Illness, unspecified: Secondary | ICD-10-CM | POA: Diagnosis not present

## 2020-06-10 DIAGNOSIS — G3184 Mild cognitive impairment, so stated: Secondary | ICD-10-CM | POA: Diagnosis not present

## 2020-06-10 DIAGNOSIS — G20 Parkinson's disease: Secondary | ICD-10-CM | POA: Diagnosis not present

## 2020-06-10 DIAGNOSIS — I10 Essential (primary) hypertension: Secondary | ICD-10-CM

## 2020-06-10 MED ORDER — DONEPEZIL HCL 10 MG PO TABS: ORAL_TABLET | ORAL | 3 refills | Status: DC
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
## 2020-06-10 NOTE — Patient Instructions (Signed)
1. Continue Donepezil 10mg  daily  2. Continue with Botox for drooling  3. Continue close supervision  4. Follow-up in 6 months, call for any changes  FALL PRECAUTIONS: Be cautious when walking. Scan the area for obstacles that may increase the risk of trips and falls. When getting up in the mornings, sit up at the edge of the bed for a few minutes before getting out of bed. Consider elevating the bed at the head end to avoid drop of blood pressure when getting up. Walk always in a well-lit room (use night lights in the walls). Avoid area rugs or power cords from appliances in the middle of the walkways. Use a walker or a cane if necessary and consider physical therapy for balance exercise. Get your eyesight checked regularly.  FINANCIAL OVERSIGHT: Supervision, especially oversight when making financial decisions or transactions is also recommended as difficulties arise.  HOME SAFETY: Consider the safety of the kitchen when operating appliances like stoves, microwave oven, and blender. Consider having supervision and share cooking responsibilities until no longer able to participate in those. Accidents with firearms and other hazards in the house should be identified and addressed as well.  DRIVING: Regarding driving, in patients with progressive memory problems, driving will be impaired. We advise to have someone else do the driving if trouble finding directions or if minor accidents are reported. Independent driving assessment is available to determine safety of driving.  ABILITY TO BE LEFT ALONE: If patient is unable to contact 911 operator, consider using LifeLine, or when the need is there, arrange for someone to stay with patients. Smoking is a fire hazard, consider supervision or cessation. Risk of wandering should be assessed by caregiver and if detected at any point, supervision and safe proof recommendations should be instituted.  MEDICATION SUPERVISION: Inability to self-administer  medication needs to be constantly addressed. Implement a mechanism to ensure safe administration of the medications.  RECOMMENDATIONS FOR ALL PATIENTS WITH MEMORY PROBLEMS: 1. Continue to exercise (Recommend 30 minutes of walking everyday, or 3 hours every week) 2. Increase social interactions - continue going to [place] and enjoy social gatherings with friends and family 3. Eat healthy, avoid fried foods and eat more fruits and vegetables 4. Maintain adequate blood pressure, blood sugar, and blood cholesterol level. Reducing the risk of stroke and cardiovascular disease also helps promoting better memory. 5. Avoid stressful situations. Live a simple life and avoid aggravations. Organize your time and prepare for the next day in anticipation. 6. Sleep well, avoid any interruptions of sleep and avoid any distractions in the bedroom that may interfere with adequate sleep quality 7. Avoid sugar, avoid sweets as there is a strong link between excessive sugar intake, diabetes, and cognitive impairment The Mediterranean diet has been shown to help patients reduce the risk of progressive memory disorders and reduces cardiovascular risk. This includes eating fish, eat fruits and green leafy vegetables, nuts like almonds and hazelnuts, walnuts, and also use olive oil. Avoid fast foods and fried foods as much as possible. Avoid sweets and sugar as sugar use has been linked to worsening of memory function.  There is always a concern of gradual progression of memory problems. If this is the case, then we may need to adjust level of care according to patient needs. Support, both to the patient and caregiver, should then be put into place.

## 2020-06-18 ENCOUNTER — Ambulatory Visit: Payer: Medicare HMO | Admitting: Neurology

## 2020-06-20 ENCOUNTER — Ambulatory Visit: Payer: Medicare HMO | Admitting: Neurology

## 2020-06-27 ENCOUNTER — Ambulatory Visit (INDEPENDENT_AMBULATORY_CARE_PROVIDER_SITE_OTHER): Payer: Medicare HMO | Admitting: Neurology

## 2020-06-27 ENCOUNTER — Other Ambulatory Visit: Payer: Self-pay

## 2020-06-27 DIAGNOSIS — K117 Disturbances of salivary secretion: Secondary | ICD-10-CM | POA: Diagnosis not present

## 2020-06-27 MED ORDER — RIMABOTULINUMTOXINB 5000 UNIT/ML IM SOLN
5000.0000 [IU] | Freq: Once | INTRAMUSCULAR | Status: AC
  Administered 2020-06-27: 5000 [IU] via INTRAMUSCULAR

## 2020-06-27 NOTE — Procedures (Signed)
Botulinum [facility]    History:  Diagnosis: Sialorrhea    Result History  Tried xeomin in past without relief.    Consent obtained from: The patient The patient was educated on the botulinum toxin the black blox warning and given a copy of the botox patient medication guide.  The patient understands that this warning states that there have been reported cases of the Botox extending beyond the injection site and creating adverse effects, similar to those of botulism. This included loss of strength, trouble walking, hoarseness, trouble saying words clearly, loss of bladder control, trouble breathing, trouble swallowing, diplopia, blurry vision and ptosis. Most of the distant spread of Botox was happening in patients, primarily children, who received medication for spasticity or for cervical dystonia. The patient expressed understanding and desire to proceed.     Injections  Location Left  Right Units Number of sites  Submandibular gland 250 250 500 1 per side  Parotid 2250 2250 2500 1 per side  TOTAL UNITS:     5000      Type of Toxin: Myobloc type B As ordered and injected IM at today's visit Total Units: 5000  Discarded Units: 0  Needle drawback with each injection was free of blood. Pt tolerated procedure well without complications.   Reinjection is anticipated in 3 months.

## 2020-07-11 ENCOUNTER — Other Ambulatory Visit: Payer: Self-pay | Admitting: Physician Assistant

## 2020-07-11 DIAGNOSIS — E785 Hyperlipidemia, unspecified: Secondary | ICD-10-CM

## 2020-07-18 ENCOUNTER — Encounter: Payer: Self-pay | Admitting: Physician Assistant

## 2020-07-18 ENCOUNTER — Ambulatory Visit (INDEPENDENT_AMBULATORY_CARE_PROVIDER_SITE_OTHER): Payer: Medicare HMO | Admitting: Physician Assistant

## 2020-07-18 ENCOUNTER — Other Ambulatory Visit: Payer: Self-pay

## 2020-07-18 VITALS — BP 120/70 | HR 70 | Temp 98.3°F | Resp 14 | Ht 69.0 in | Wt 136.0 lb

## 2020-07-18 DIAGNOSIS — E1151 Type 2 diabetes mellitus with diabetic peripheral angiopathy without gangrene: Secondary | ICD-10-CM

## 2020-07-18 DIAGNOSIS — Z23 Encounter for immunization: Secondary | ICD-10-CM | POA: Diagnosis not present

## 2020-07-18 LAB — COMPREHENSIVE METABOLIC PANEL
ALT: 16 U/L (ref 0–53)
AST: 18 U/L (ref 0–37)
Albumin: 4.4 g/dL (ref 3.5–5.2)
Alkaline Phosphatase: 82 U/L (ref 39–117)
BUN: 20 mg/dL (ref 6–23)
CO2: 29 mEq/L (ref 19–32)
Calcium: 9.6 mg/dL (ref 8.4–10.5)
Chloride: 103 mEq/L (ref 96–112)
Creatinine, Ser: 1.1 mg/dL (ref 0.40–1.50)
GFR: 65.22 mL/min (ref >60.00)
Glucose, Bld: 118 mg/dL — ABNORMAL HIGH (ref 70–99)
Potassium: 3.9 mEq/L (ref 3.5–5.1)
Sodium: 140 mEq/L (ref 135–145)
Total Bilirubin: 0.5 mg/dL (ref 0.2–1.2)
Total Protein: 6.9 g/dL (ref 6.0–8.3)

## 2020-07-18 LAB — LIPID PANEL
Cholesterol: 140 mg/dL (ref 0–200)
HDL: 46.9 mg/dL (ref >39.00)
LDL Cholesterol: 71 mg/dL (ref 0–99)
NonHDL: 93.55
Total CHOL/HDL Ratio: 3
Triglycerides: 111 mg/dL (ref 0.0–149.0)
VLDL: 22.2 mg/dL (ref 0.0–40.0)

## 2020-07-18 LAB — HEMOGLOBIN A1C: Hgb A1c MFr Bld: 6.5 % (ref 4.6–6.5)

## 2020-07-18 NOTE — Progress Notes (Signed)
History of Present Illness: Patient is a 74 y.o. male who presents to clinic today for follow-up of Diabetes Mellitus II, controlled.  Patient currently on medication regimen of Metformin 1000 mg twice daily.  Endorses taking medications as directed. Endorses tolerating well.  Denies vision changes, numbness or tingling. Endorses checking blood glucose as directed -- AM fasting glucose averaging 180 over the past month. Denies any change in diet.  Latest Maintenance: A1C --  Lab Results  Component Value Date   HGBA1C 6.3 01/18/2020   Diabetic Eye Exam -- Has appintment scheduled 09/02/2020.  Urine Microalbumin -- Taking ARB Foot Exam -- Due. Denies concerns today.   Past Medical History:  Diagnosis Date  . Allergy   . Cancer [facility])    Skin cancer  . Cataract   . Coronary artery disease   . Diabetes mellitus   . Diverticulosis   . Esophageal stricture   . GERD (gastroesophageal reflux disease)   . Hyperlipidemia   . Hypertension   . Peripheral vascular disease ([place])     Current Outpatient Medications on File Prior to Visit  Medication Sig Dispense Refill  . amLODipine (NORVASC) 5 MG tablet Take 1 tablet (5 mg total) by mouth daily. 90 tablet 1  . aspirin 81 MG tablet Take 81 mg by mouth daily.      Marland Kitchen atorvastatin (LIPITOR) 40 MG tablet Take 1 tablet by mouth once daily 90 tablet 0  . atropine 1 % ophthalmic solution Use as instructed in your mouth 2 mL 12  . Blood Glucose Monitoring Suppl (ONE TOUCH ULTRA 2) w/Device KIT 1 kit by Does not apply route daily. 1 kit 0  . cyanocobalamin 2000 MCG tablet Take 1 tablet (2,000 mcg total) by mouth daily. 30 tablet 0  . Dihydroxyaluminum Sod Carb (ROLAIDS PO) Take by mouth as needed.    . donepezil (ARICEPT) 10 MG tablet Take 1 tablet daily 90 tablet 3  . fluticasone (FLONASE) 50 MCG/ACT nasal spray Place 2 sprays into both nostrils daily. 16 g 6  . glucose blood (ONETOUCH ULTRA) test strip Check blood sugars daily Dx:E11.51 100 each  12  . Lancets (ONETOUCH ULTRASOFT) lancets Check blood sugars once daily. Dx: E11.51 100 each 12  . losartan (COZAAR) 100 MG tablet Take 1 tablet by mouth once daily 90 tablet 0  . metFORMIN (GLUCOPHAGE) 1000 MG tablet Take 1 tablet (1,000 mg total) by mouth 2 (two) times daily with a meal. 180 tablet 1  . OVER THE COUNTER MEDICATION Allergy relief, one tablet daily.     Current Facility-Administered Medications on File Prior to Visit  Medication Dose Route Frequency Provider Last Rate Last Admin  . incobotulinumtoxinA (XEOMIN) 100 units injection 100 Units  100 Units Intramuscular Q90 days Tat, Eustace Quail, DO   100 Units at 02/22/20 1035    No Known Allergies  Family History  Problem Relation Age of Onset  . Breast cancer Mother   . Healthy Son   . Healthy Son   . Parkinson's disease Sister   . Colon cancer Neg Hx   . Esophageal cancer Neg Hx   . Rectal cancer Neg Hx   . Stomach cancer Neg Hx     Social History   Socioeconomic History  . Marital status: Married    Spouse name: Not on file  . Number of children: 3  . Years of education: Not on file  . Highest education level: Not on file  Occupational History  . Occupation: Passenger transport manager  Tobacco Use  . Smoking status: Former Smoker    Quit date: 11/02/1999    Years since quitting: 20.7  . Smokeless tobacco: Never Used  Vaping Use  . Vaping Use: Never used  Substance and Sexual Activity  . Alcohol use: No  . Drug use: No  . Sexual activity: Not Currently    Partners: Female  Other Topics Concern  . Not on file  Social History Narrative   Daily caffeine       Right handed      Lives with wife in a one story home      Social Determinants of Health   Financial Resource Strain: Low Risk   . Difficulty of Paying Living Expenses: Not hard at all  Food Insecurity: No Food Insecurity  . Worried About Charity fundraiser in the Last Year: Never true  . Ran Out of Food in the Last Year: Never true  Transportation Needs:  No Transportation Needs  . Lack of Transportation (Medical): No  . Lack of Transportation (Non-Medical): No  Physical Activity: Insufficiently Active  . Days of Exercise per Week: 3 days  . Minutes of Exercise per Session: 10 min  Stress: No Stress Concern Present  . Feeling of Stress : Not at all  Social Connections: Socially Integrated  . Frequency of Communication with Friends and Family: More than three times a week  . Frequency of Social Gatherings with Friends and Family: Once a week  . Attends Religious Services: More than 4 times per year  . Active Member of Clubs or Organizations: Yes  . Attends Archivist Meetings: More than 4 times per year  . Marital Status: Married    Review of Systems: Pertinent ROS are listed in HPI  Physical Examination: BP 120/70   Pulse 70   Temp 98.3 F (36.8 C) (Temporal)   Resp 14   Ht _0  (1.753 m)   Wt 136 lb (61.7 kg)   SpO2 98%   BMI 20.08 kg/m  General appearance: alert, cooperative and no distress Lungs: clear to auscultation bilaterally Heart: regular rate and rhythm, S1, S2 normal, no murmur, click, rub or gallop Extremities: extremities normal, atraumatic, no cyanosis or edema Neurologic: Alert and oriented x3.  Shuffling gait of parkinsonism noted.  No significant extremity rigidity.  Diabetic Foot Form - Detailed   Diabetic Foot Exam - detailed Can the patient see the bottom of their feet?: No Are the shoes appropriate in style and fit?: Yes Is there swelling or and abnormal foot shape?: No Is there a claw toe deformity?: No Is there elevated skin temparature?: No Is there foot or ankle muscle weakness?: No Normal Range of Motion: Yes Pulse Foot Exam completed.: Yes  Right posterior Tibialias: Present Left posterior Tibialias: Present  Right Dorsalis Pedis: Present Left Dorsalis Pedis: Present  Semmes-Weinstein Monofilament Test R Site 1-Great Toe: Pos L Site 1-Great Toe: Pos         Assessment/Plan: 1. Diabetes mellitus with peripheral circulatory disorder [facility]) patient taking medications as directed.  Tolerating well.  Recent increase in fasting glucose.  Will check A1c today to ensure stability.  If no increase in A1c will set patient up with new glucometer.  If increased will adjust medication regimen accordingly.  Dietary recommendations reviewed.  Eye examination is scheduled for November 2.  Foot examination updated today.  No concerning findings.  High-dose flu vaccine given today. - Comprehensive metabolic panel - Lipid panel - Hemoglobin A1c  2.  Need for influenza vaccination - Flu Vaccine QUAD High Dose(Fluad)  This visit occurred during the SARS-CoV-2 public health emergency.  Safety protocols were in place, including screening questions prior to the visit, additional usage of staff PPE, and extensive cleaning of exam room while observing appropriate contact time as indicated for disinfecting solutions.

## 2020-07-18 NOTE — Patient Instructions (Signed)
Please go to the lab today for blood work.  I will call you with your results. We will alter treatment regimen(s) if indicated by your results.   Your flu shot was updated today.   Diabetes Basics  Diabetes (diabetes mellitus) is a long-term (chronic) disease. It occurs when the body does not properly use sugar (glucose) that is released from food after you eat. Diabetes may be caused by one or both of these problems:  Your pancreas does not make enough of a hormone called insulin.  Your body does not react in a normal way to insulin that it makes. Insulin lets sugars (glucose) go into cells in your body. This gives you energy. If you have diabetes, sugars cannot get into cells. This causes high blood sugar (hyperglycemia). Follow these instructions at home: How is diabetes treated? You may need to take insulin or other diabetes medicines daily to keep your blood sugar in balance. Take your diabetes medicines every day as told by your doctor. List your diabetes medicines here: Diabetes medicines  Name of medicine: ______________________________ ? Amount (dose): _______________ Time (a.m./p.m.): _______________ Notes: ___________________________________  Name of medicine: ______________________________ ? Amount (dose): _______________ Time (a.m./p.m.): _______________ Notes: ___________________________________  Name of medicine: ______________________________ ? Amount (dose): _______________ Time (a.m./p.m.): _______________ Notes: ___________________________________ If you use insulin, you will learn how to give yourself insulin by injection. You may need to adjust the amount based on the food that you eat. List the types of insulin you use here: Insulin  Insulin type: ______________________________ ? Amount (dose): _______________ Time (a.m./p.m.): _______________ Notes: ___________________________________  Insulin type: ______________________________ ? Amount (dose):  _______________ Time (a.m./p.m.): _______________ Notes: ___________________________________  Insulin type: ______________________________ ? Amount (dose): _______________ Time (a.m./p.m.): _______________ Notes: ___________________________________  Insulin type: ______________________________ ? Amount (dose): _______________ Time (a.m./p.m.): _______________ Notes: ___________________________________  Insulin type: ______________________________ ? Amount (dose): _______________ Time (a.m./p.m.): _______________ Notes: ___________________________________ How do I manage my blood sugar?  Check your blood sugar levels using a blood glucose monitor as directed by your doctor. Your doctor will set treatment goals for you. Generally, you should have these blood sugar levels:  Before meals (preprandial): 80-130 mg/dL (4.4-7.2 mmol/L).  After meals (postprandial): below 180 mg/dL (10 mmol/L).  A1c level: less than 7%. Write down the times that you will check your blood sugar levels: Blood sugar checks  Time: _______________ Notes: ___________________________________  Time: _______________ Notes: ___________________________________  Time: _______________ Notes: ___________________________________  Time: _______________ Notes: ___________________________________  Time: _______________ Notes: ___________________________________  Time: _______________ Notes: ___________________________________  What do I need to know about low blood sugar? Low blood sugar is called hypoglycemia. This is when blood sugar is at or below 70 mg/dL (3.9 mmol/L). Symptoms may include:  Feeling: ? Hungry. ? Worried or nervous (anxious). ? Sweaty and clammy. ? Confused. ? Dizzy. ? Sleepy. ? Sick to your stomach (nauseous).  Having: ? A fast heartbeat. ? A headache. ? A change in your vision. ? Tingling or no feeling (numbness) around the mouth, lips, or tongue. ? Jerky movements that you cannot  control (seizure).  Having trouble with: ? Moving (coordination). ? Sleeping. ? Passing out (fainting). ? Getting upset easily (irritability). Treating low blood sugar To treat low blood sugar, eat or drink something sugary right away. If you can think clearly and swallow safely, follow the 15:15 rule:  Take 15 grams of a fast-acting carb (carbohydrate). Talk with your doctor about how much you should take.  Some fast-acting carbs are: ? Sugar tablets (glucose pills).  Take 3-4 glucose pills. ? 6-8 pieces of hard candy. ? 4-6 oz (120-150 mL) of fruit juice. ? 4-6 oz (120-150 mL) of regular (not diet) soda. ? 1 Tbsp (15 mL) honey or sugar.  Check your blood sugar 15 minutes after you take the carb.  If your blood sugar is still at or below 70 mg/dL (3.9 mmol/L), take 15 grams of a carb again.  If your blood sugar does not go above 70 mg/dL (3.9 mmol/L) after 3 tries, get help right away.  After your blood sugar goes back to normal, eat a meal or a snack within 1 hour. Treating very low blood sugar If your blood sugar is at or below 54 mg/dL (3 mmol/L), you have very low blood sugar (severe hypoglycemia). This is an emergency. Do not wait to see if the symptoms will go away. Get medical help right away. Call your local emergency services (911 in the [place] Do not drive yourself to the hospital. Questions to ask your health care provider  Do I need to meet with a diabetes educator?  What equipment will I need to care for myself at home?  What diabetes medicines do I need? When should I take them?  How often do I need to check my blood sugar?  What number can I call if I have questions?  When is my next doctor's visit?  Where can I find a support group for people with diabetes? Where to find more information  American Diabetes Association: www.diabetes.org  American Association of Diabetes Educators: www.diabeteseducator.org/patient-resources Contact a doctor if:  Your  blood sugar is at or above 240 mg/dL (13.3 mmol/L) for 2 days in a row.  You have been sick or have had a fever for 2 days or more, and you are not getting better.  You have any of these problems for more than 6 hours: ? You cannot eat or drink. ? You feel sick to your stomach (nauseous). ? You throw up (vomit). ? You have watery poop (diarrhea). Get help right away if:  Your blood sugar is lower than 54 mg/dL (3 mmol/L).  You get confused.  You have trouble: ? Thinking clearly. ? Breathing. Summary  Diabetes (diabetes mellitus) is a long-term (chronic) disease. It occurs when the body does not properly use sugar (glucose) that is released from food after digestion.  Take insulin and diabetes medicines as told.  Check your blood sugar every day, as often as told.  Keep all follow-up visits as told by your doctor. This is important. This information is not intended to replace advice given to you by your health care provider. Make sure you discuss any questions you have with your health care provider. Document Revised: 07/11/2019 Document Reviewed: 01/20/2018 Elsevier Patient Education  [place].

## 2020-07-21 ENCOUNTER — Other Ambulatory Visit: Payer: Self-pay

## 2020-07-21 ENCOUNTER — Telehealth: Payer: Medicare HMO

## 2020-07-21 DIAGNOSIS — L570 Actinic keratosis: Secondary | ICD-10-CM | POA: Diagnosis not present

## 2020-07-21 DIAGNOSIS — C44329 Squamous cell carcinoma of skin of other parts of face: Secondary | ICD-10-CM | POA: Diagnosis not present

## 2020-07-21 DIAGNOSIS — D485 Neoplasm of uncertain behavior of skin: Secondary | ICD-10-CM | POA: Diagnosis not present

## 2020-07-21 DIAGNOSIS — Z85828 Personal history of other malignant neoplasm of skin: Secondary | ICD-10-CM | POA: Diagnosis not present

## 2020-07-21 DIAGNOSIS — E1151 Type 2 diabetes mellitus with diabetic peripheral angiopathy without gangrene: Secondary | ICD-10-CM

## 2020-07-21 DIAGNOSIS — R69 Illness, unspecified: Secondary | ICD-10-CM | POA: Diagnosis not present

## 2020-07-21 MED ORDER — ONETOUCH ULTRASOFT LANCETS MISC: 12 refills | Status: DC

## 2020-07-21 MED ORDER — ONETOUCH ULTRA 2 W/DEVICE KIT
1.0000 | PACK | Freq: Every day | 0 refills | Status: AC
Start: 2020-07-21 — End: ?

## 2020-07-21 MED ORDER — ONETOUCH ULTRA VI STRP: ORAL_STRIP | 12 refills | Status: DC

## 2020-07-24 ENCOUNTER — Other Ambulatory Visit: Payer: Self-pay

## 2020-07-24 ENCOUNTER — Ambulatory Visit: Payer: Medicare HMO

## 2020-07-24 DIAGNOSIS — E1151 Type 2 diabetes mellitus with diabetic peripheral angiopathy without gangrene: Secondary | ICD-10-CM

## 2020-07-24 DIAGNOSIS — I10 Essential (primary) hypertension: Secondary | ICD-10-CM

## 2020-07-24 NOTE — Progress Notes (Signed)
Chronic Care Management Pharmacy  Name: Walter [name]  MRN: [id number] DOB: 26-Aug-1946  Chief Complaint/ HPI  Walter [name],  74 y.o., male presents for their Follow-Up CCM visit with the clinical pharmacist via telephone due to COVID-19 Pandemic.  PCP : Brunetta Jeans, PA-C  Chronic conditions include:  Encounter Diagnoses  Name Primary?  . Essential hypertension Yes  . Diabetes mellitus with peripheral circulatory disorder [facility])     Patient Active Problem List   Diagnosis Date Noted  . Parkinson's disease ([place]) 03/18/2020  . 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  . Generalized weakness   . Hyperglycemia   . Stricture and stenosis of esophagus 06/07/2012  . B12 deficiency 06/07/2012  . GERD 05/07/2009  . Peripheral vascular disease 03/13/2008  . Diabetes mellitus with peripheral circulatory disorder ([place]) 05/24/2007  . Essential hypertension 05/24/2007  . Coronary atherosclerosis 05/24/2007   Past Surgical History:  Procedure Laterality Date  . CORONARY ANGIOPLASTY WITH STENT PLACEMENT    . ESOPHAGOGASTRODUODENOSCOPY  06/07/2012   Procedure: ESOPHAGOGASTRODUODENOSCOPY (EGD);  Surgeon: Lafayette Dragon, MD;  Location: [facility];  Service: Endoscopy;  Laterality: N/A;  . SHOULDER SURGERY  2006   Family History  Problem Relation Age of Onset  . Breast cancer Mother   . Healthy Son   . Healthy Son   . Parkinson's disease Sister   . Colon cancer Neg Hx   . Esophageal cancer Neg Hx   . Rectal cancer Neg Hx   . Stomach cancer Neg Hx    No Known Allergies Outpatient Encounter Medications as of 07/24/2020  Medication Sig Note  . amLODipine (NORVASC) 5 MG tablet Take 1 tablet (5 mg total) by mouth daily.   Marland Kitchen atorvastatin (LIPITOR) 40 MG tablet Take 1 tablet by mouth once daily   . atropine 1 % ophthalmic solution Use as instructed in your mouth   . Blood Glucose Monitoring Suppl (ONE TOUCH ULTRA 2) w/Device KIT 1 kit by Does not apply route daily.   .  cyanocobalamin 2000 MCG tablet Take 1 tablet (2,000 mcg total) by mouth daily.   . Dihydroxyaluminum Sod Carb (ROLAIDS PO) Take by mouth as needed. 04/19/2019: PRN  . donepezil (ARICEPT) 10 MG tablet Take 1 tablet daily   . fluticasone (FLONASE) 50 MCG/ACT nasal spray Place 2 sprays into both nostrils daily.   Marland Kitchen glucose blood (ONETOUCH ULTRA) test strip Check blood sugars daily Dx:E11.51   . Lancets (ONETOUCH ULTRASOFT) lancets Check blood sugars once daily. Dx: E11.51   . losartan (COZAAR) 100 MG tablet Take 1 tablet by mouth once daily   . metFORMIN (GLUCOPHAGE) 1000 MG tablet Take 1 tablet (1,000 mg total) by mouth 2 (two) times daily with a meal.   . OVER THE COUNTER MEDICATION Allergy relief, one tablet daily.   Marland Kitchen aspirin 81 MG tablet Take 81 mg by mouth daily.   10/15/2019: Pt states he only takes once in a while   Facility-Administered Encounter Medications as of 07/24/2020  Medication  . incobotulinumtoxinA (XEOMIN) 100 units injection 100 Units   Patient Care Team    Relationship Specialty Notifications Start End  Brunetta Jeans, [place] PCP - General Family Medicine  07/25/18   Cameron Sprang, MD Consulting Physician Neurology  10/08/19   Leticia Clas, [place]  Optometry  01/15/20   [name], [facility] Pharmacist Pharmacist  01/21/20    Comment: Clinical Pharmacist. Phone: [phone]   Current Diagnosis/Assessment: Goals Addressed  This Visit's Progress   .  Patient Stated (pt-stated)   On track     CARE PLAN ENTRY  Current Barriers:  . Chronic Disease Management support, education, and care coordination needs related to diabetes, hypertension  Pharmacist Clinical Goal(s):  Marland Kitchen Over the next 14 days, patient will demonstrate Improved medication adherence as evidenced by adherence packaging . Blood pressure <140/90  Interventions: . Comprehensive medication review performed. Marland Kitchen Collaboration with provider re: medication management  Patient Self Care Activities:   . Patient verbalizes understanding of plan to continue to check blood pressure at home let us know if readings start trending above 140/90  Initial goal documentation       Hypertension   BP goal <140/90  BP Readings from Last 3 Encounters:  07/18/20 120/70  06/10/20 (!) 143/61  04/14/20 116/60   Patient checks BP at home 1-2x per week Patient home BP readings are ranging: 140s/90s Reports to be eating balanced diet w/ fruit, vegetables, chicken. Continues to stay active with yard work. No recent falls, denies dizziness..  Patient is currently at goal on the following medications:  . Amlodipine 5 mg once daily . Losartan 100 mg once daily  We discussed diet and exercise extensively.   Plan  Continue current medications.   Diabetes   A1c goal < 7%  Lab Results  Component Value Date/Time   HGBA1C 6.5 07/18/2020 10:35 AM   HGBA1C 6.3 01/18/2020 09:20 AM   MICROALBUR <0.7 02/02/2018 10:01 AM   MICROALBUR <0.7 10/01/2016 08:48 AM    Checking BG: Daily. Recent FBG readings low 100s.  a1c 6.5 07/2020. Patient is currently controlled on the following medications:  Marland Kitchen Metformin 1000 mg twice daily   We discussed: diet and exercise extensively. Has eye exam scheduled for 10/02/2020  Plan  Continue current medications.  Hyperlipidemia   LDL goal < 70  Lipid Panel     Component Value Date/Time   CHOL 140 07/18/2020 1035   TRIG 111.0 07/18/2020 1035   TRIG 158 (H) 08/17/2006 1059   HDL 46.90 07/18/2020 1035   LDLCALC 71 07/18/2020 1035    Hepatic Function Latest Ref Rng & Units 07/18/2020 01/18/2020 04/19/2019  Total Protein 6.0 - 8.3 g/dL 6.9 7.1 6.9  Albumin 3.5 - 5.2 g/dL 4.4 4.2 4.4  AST 0 - 37 U/L _0 ALT 0 - 53 U/L _1 Alk Phosphatase 39 - 117 U/L 82 93 104  Total Bilirubin 0.2 - 1.2 mg/dL 0.5 0.6 0.6  Bilirubin, Direct 0.0 - 0.3 mg/dL - - -    The 10-year ASCVD risk score Mikey Bussing DC Jr., et al., 2013) is: 40.4%   Values used to calculate the  score:     Age: 14 years     Sex: Male     Is Non-Hispanic African American: No     Diabetic: Yes     Tobacco smoker: No     Systolic Blood Pressure: 096 mmHg     Is BP treated: Yes     HDL Cholesterol: 46.9 mg/dL     Total Cholesterol: 140 mg/dL   Patient has failed these meds in past: n/a Patient is currently near goal on the following medications:  . Atorvastatin 40 mg once daily  We discussed:  diet and exercise extensively.  Plan  Continue current medications.  Vaccines   Immunization History  Administered Date(s) Administered  . Fluad Quad(high Dose 65+) 07/18/2020  . Influenza Split 08/16/2011  . Influenza Whole  08/01/2006, 07/31/2008, 09/17/2009, 07/15/2010  . Influenza, High Dose Seasonal PF 10/02/2014, 12/11/2015, 10/06/2016, 08/04/2017, 07/20/2018, 07/28/2019  . Influenza,inj,Quad PF,6+ Mos 10/02/2013  . Moderna SARS-COVID-2 Vaccination 12/13/2019, 01/10/2020  . Pneumococcal Conjugate-13 04/02/2014  . Pneumococcal Polysaccharide-23 08/01/2006, 03/12/2011  . Td 03/16/2012  . Tdap 03/16/2012  . Zoster 10/11/2011   Reviewed and discussed patient's vaccination history.  Annual flu received. covid-19 vaccine booster pending CDC recommendation before publicly available.   Plan  Recommended patient receive covid booster per cdc recommendations, shingrix.  Medication Management / Care Coordination   Receives prescription medications from:  [place], [place] [address] Phone: [phone] Fax: [phone]   Denies any issues with current medication management.   Plan  Has eye exam scheduled 10/02/2020 Continue current medication management strategy. ___________________________ SDOH (Social Determinants of Health) assessments performed: Yes.  Future Appointments  Date Time Provider [place]  10/03/2020  9:45 AM Tat, Eustace Quail, DO [facility] None  01/14/2021 10:00 AM Cameron Sprang, MD [facility]  None  01/15/2021  8:30 AM [facility] CCM PHARMACIST [facility]  04/20/2021  3:00 PM [facility] HEALTH COACH [facility]   Visit follow-up:  . [facility] follow-up: 6 month telephone visit.  [name], Pharm.D., BCGP Clinical Pharmacist [facility] [phone]

## 2020-07-25 ENCOUNTER — Ambulatory Visit: Payer: Medicare HMO | Admitting: Physician Assistant

## 2020-07-31 ENCOUNTER — Telehealth: Payer: Self-pay | Admitting: Neurology

## 2020-07-31 NOTE — Telephone Encounter (Signed)
-----   Message from [place], DO sent at 06/27/2020 11:49 AM EDT ----- Call patient and find out how he did with the last set of botox injections.  We changed him from xeomin to myobloc to see if drooling was better.

## 2020-07-31 NOTE — Telephone Encounter (Signed)
Patient is sch for Myobloc for next injection  In Dec

## 2020-07-31 NOTE — Telephone Encounter (Signed)
Patient states his drooling is a lot better and thanked me for calling.

## 2020-07-31 NOTE — Telephone Encounter (Signed)
Great!  Make sure he is scheduled for future injections then

## 2020-08-18 NOTE — Progress Notes (Signed)
Per BV patient needs to be changed to [facility]. Called them to set up account and to give verbal prescription for Myobloc.

## 2020-08-20 NOTE — Progress Notes (Signed)
2nd approval for Myobloc valid from 11/02/19 to 10/31/21.

## 2020-09-13 ENCOUNTER — Other Ambulatory Visit: Payer: Self-pay | Admitting: Physician Assistant

## 2020-09-13 DIAGNOSIS — I10 Essential (primary) hypertension: Secondary | ICD-10-CM

## 2020-09-29 ENCOUNTER — Other Ambulatory Visit: Payer: Self-pay | Admitting: Physician Assistant

## 2020-09-29 DIAGNOSIS — I10 Essential (primary) hypertension: Secondary | ICD-10-CM

## 2020-09-29 DIAGNOSIS — E1151 Type 2 diabetes mellitus with diabetic peripheral angiopathy without gangrene: Secondary | ICD-10-CM

## 2020-10-02 DIAGNOSIS — H02055 Trichiasis without entropian left lower eyelid: Secondary | ICD-10-CM | POA: Diagnosis not present

## 2020-10-02 DIAGNOSIS — H524 Presbyopia: Secondary | ICD-10-CM | POA: Diagnosis not present

## 2020-10-02 DIAGNOSIS — Z961 Presence of intraocular lens: Secondary | ICD-10-CM | POA: Diagnosis not present

## 2020-10-02 DIAGNOSIS — H18059 Posterior corneal pigmentations, unspecified eye: Secondary | ICD-10-CM | POA: Diagnosis not present

## 2020-10-03 ENCOUNTER — Ambulatory Visit: Payer: Medicare HMO | Admitting: Neurology

## 2020-10-04 IMAGING — MR MR HEAD WO/W CM
12 series · 48 of 48 positions shown · IV contrast (multihance)
Comparison: Head CT 01/06/2019 [HOSPITAL]

CLINICAL DATA: 73-year-old male with mild cognitive impairment.
Weakness and difficulty walking. Possible Parkinson's.

Creatinine was obtained on site at [HOSPITAL] at [HOSPITAL].
Results: Creatinine 1.2 mg/dL.
EXAM:
MRI HEAD WITHOUT AND WITH CONTRAST
TECHNIQUE: Multiplanar, multiecho pulse sequences of the brain and surrounding
structures were obtained without and with intravenous contrast.
CONTRAST:  14mL MULTIHANCE GADOBENATE DIMEGLUMINE 529 MG/ML IV SOLN

[Series 2: T1 · sagittal · 5.0mm · 0.45mm/px · 1 of 23 slices shown]
[im 1/23]
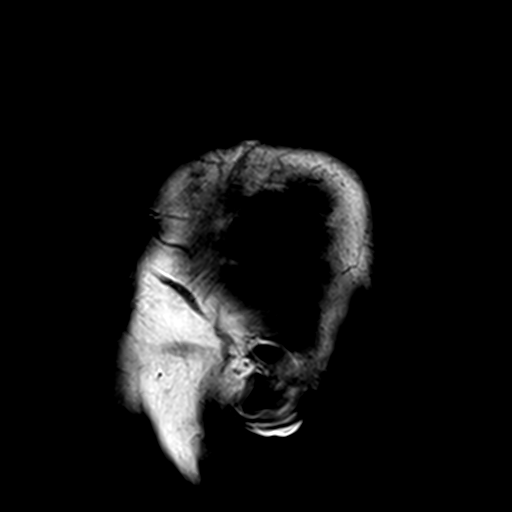

[Series 3: DWI · axial · 3.0mm · 1.80mm/px · z∈[-56,+90]mm · 7 of 100 slices shown (1 of 4)]
[im 1/100]
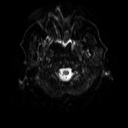
[im 17/100]
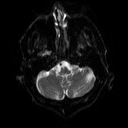
[im 34/100]
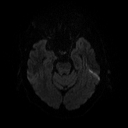
[im 50/100]
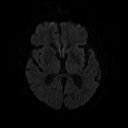
[im 67/100]
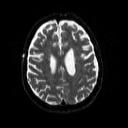
[im 83/100]
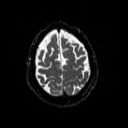
[im 100/100]
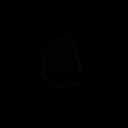

[Series 4: DWI · axial · 3.0mm · 1.80mm/px · z∈[-56,+90]mm · 3 of 49 slices shown (2 of 4)]
[im 1/49]
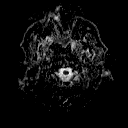
[im 25/49]
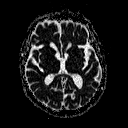
[im 49/49]
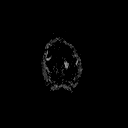

[Series 5: DWI · coronal · 5.0mm · 1.80mm/px · 5 of 68 slices shown (3 of 4)]
[im 1/68]
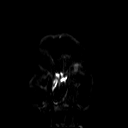
[im 17/68]
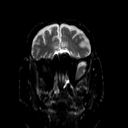
[im 34/68]
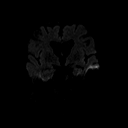
[im 51/68]
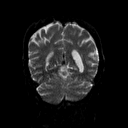
[im 68/68]
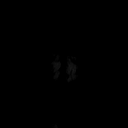

[Series 6: DWI · coronal · 5.0mm · 1.80mm/px · 2 of 34 slices shown (4 of 4)]
[im 1/34]
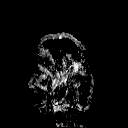
[im 34/34]
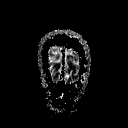

[Series 7: T2 · axial · 5.0mm · 0.51mm/px · z∈[-51,+89]mm · 2 of 22 slices shown (1 of 2)]
[im 1/22]
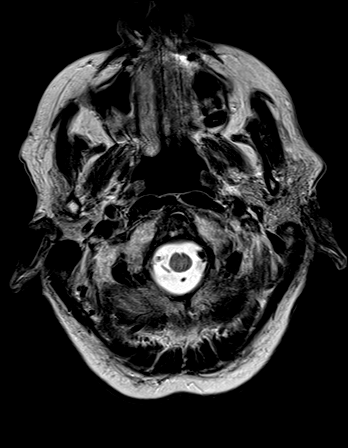
[im 22/22]
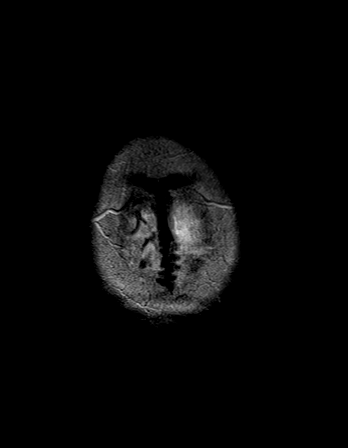

[Series 8: FLAIR · axial · 3.0mm · 0.45mm/px · z∈[-54,+88]mm · 2 of 32 slices shown]
[im 1/32]
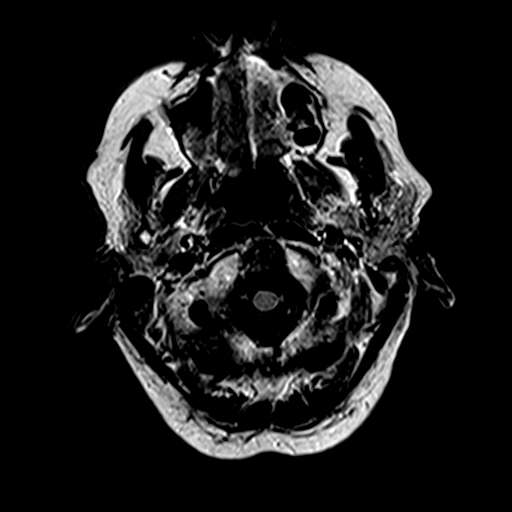
[im 32/32]
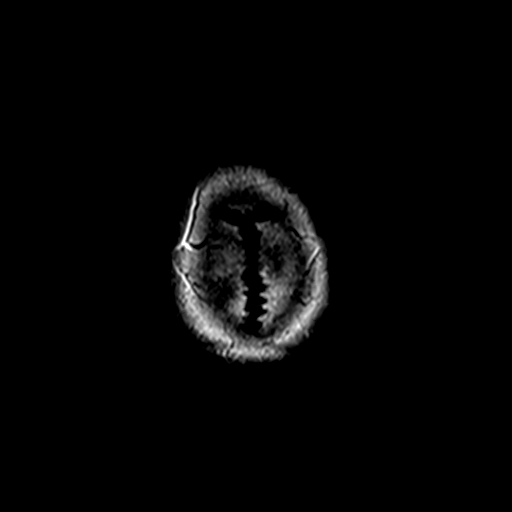

[Series 10: swi_images · axial · 4.0mm · 0.90mm/px · z∈[-52,+87]mm · 2 of 36 slices shown]
[im 1/36]
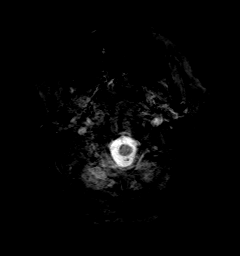
[im 36/36]
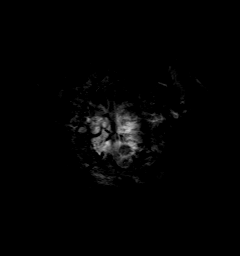

[Series 11: t1_mpr_tra · axial · 1.0mm · 0.75mm/px · z∈[-55,+86]mm · 10 of 144 slices shown (1 of 2)]
[im 1/144]
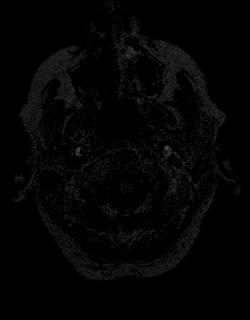
[im 16/144]
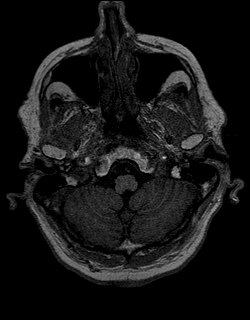
[im 32/144]
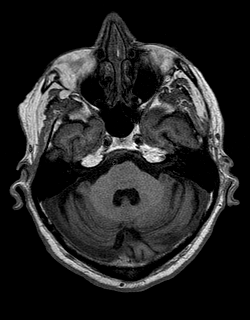
[im 48/144]
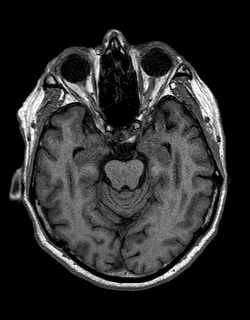
[im 64/144]
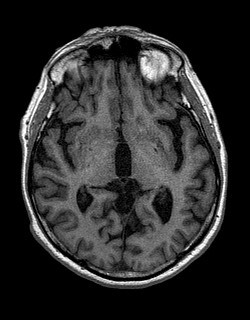
[im 80/144]
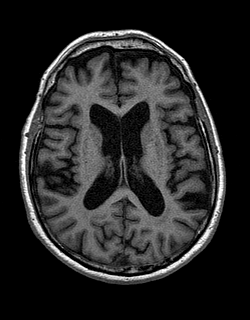
[im 96/144]
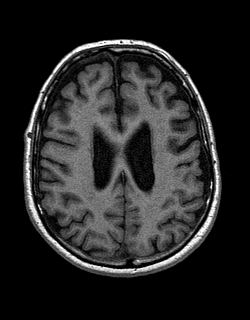
[im 112/144]
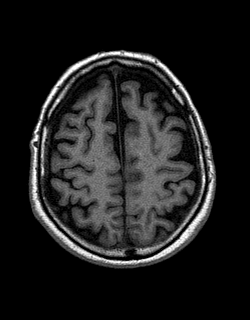
[im 128/144]
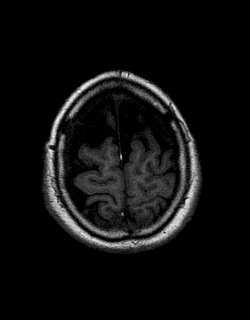
[im 144/144]
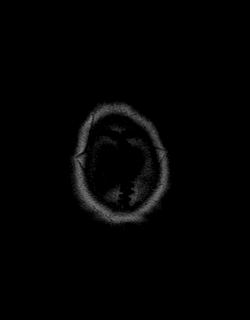

[Series 12: T2 · coronal · 5.0mm · 0.45mm/px · 2 of 25 slices shown (2 of 2)]
[im 1/25]
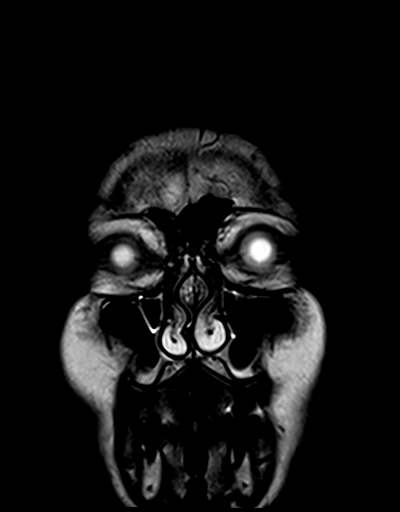
[im 25/25]
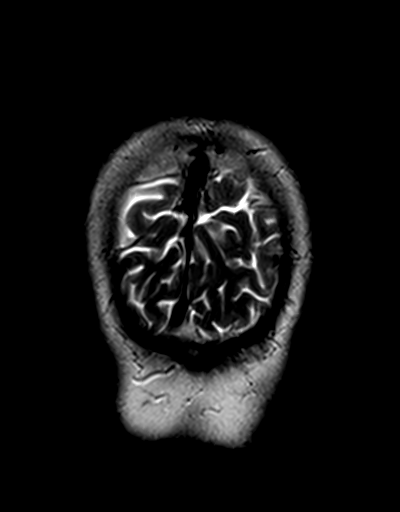

[Series 13: t1_mpr_tra · axial · 1.0mm · 0.75mm/px · z∈[-55,+86]mm · 10 of 144 slices shown (2 of 2)]
[im 1/144]
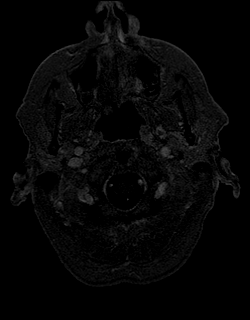
[im 16/144]
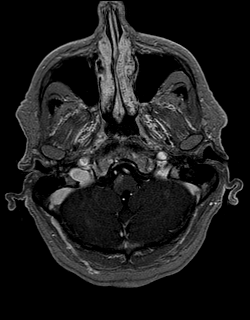
[im 32/144]
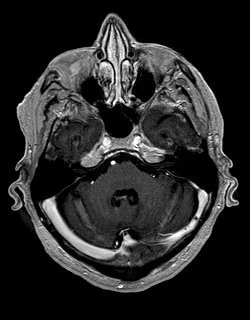
[im 48/144]
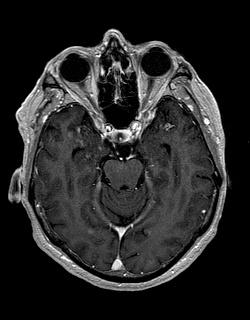
[im 64/144]
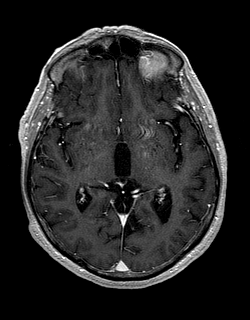
[im 80/144]
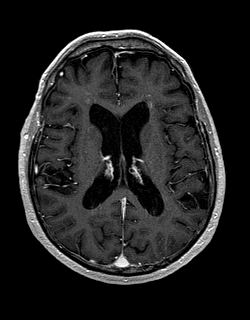
[im 96/144]
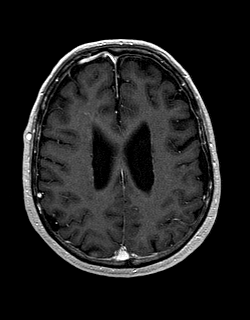
[im 112/144]
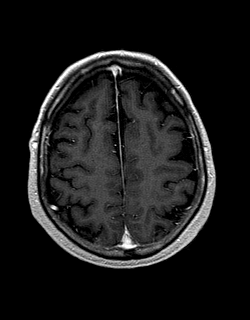
[im 128/144]
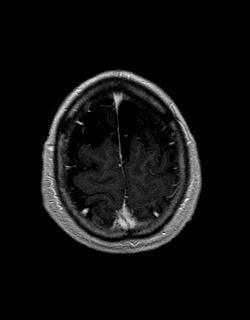
[im 144/144]
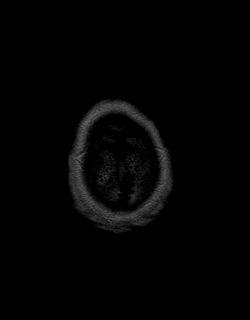

[Series 14: post cor · coronal · 5.0mm · 0.45mm/px · 2 of 25 slices shown]
[im 1/25]
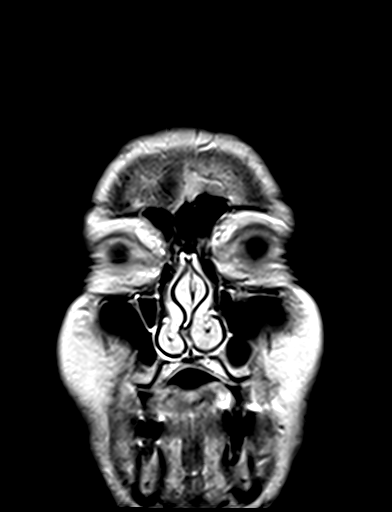
[im 25/25]
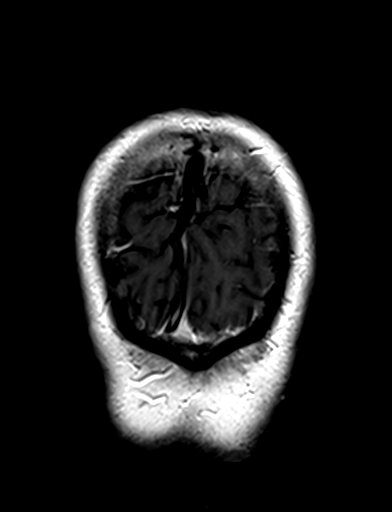

[48 of 48 positions shown; findings below may reference images not displayed]

FINDINGS: Brain: No restricted diffusion to suggest acute infarction. No
midline shift, mass effect, evidence of mass lesion,
ventriculomegaly, extra-axial collection or acute intracranial
hemorrhage. Cervicomedullary junction and pituitary are within
normal limits.

There is questionable midbrain and/or brainstem volume loss out of
proportion to that seen elsewhere (sagittal series 2, image 12). No
disproportionate areas of cerebral hemisphere atrophy are
identified.

Scattered mild for age nonspecific cerebral white matter T2 and
FLAIR hyperintensity. No cortical encephalomalacia or chronic
cerebral blood products. Signal in the deep gray matter nuclei,
brainstem and cerebellum is within normal limits.

No abnormal enhancement identified.  No dural thickening.

Vascular: Major intracranial vascular flow voids are preserved, the
left vertebral artery appears dominant. Mild generalized
intracranial artery tortuosity. The major dural venous sinuses are
enhancing and appear to be patent.

Skull and upper cervical spine: Negative visible cervical spine.
Normal bone marrow signal.

Sinuses/Orbits: Postoperative changes to both globes, otherwise
negative orbits. Trace paranasal sinus mucosal thickening.

Other: Mastoids are clear. Visible internal auditory structures
appear normal. Scalp and face soft tissues appear negative.
IMPRESSION: 1.  No acute intracranial abnormality.
2. Suspicion of disproportionate volume loss in the midbrain, but
otherwise within normal limits for age.

## 2020-10-13 DIAGNOSIS — L570 Actinic keratosis: Secondary | ICD-10-CM | POA: Diagnosis not present

## 2020-10-13 DIAGNOSIS — D225 Melanocytic nevi of trunk: Secondary | ICD-10-CM | POA: Diagnosis not present

## 2020-10-13 DIAGNOSIS — L821 Other seborrheic keratosis: Secondary | ICD-10-CM | POA: Diagnosis not present

## 2020-10-13 DIAGNOSIS — D1801 Hemangioma of skin and subcutaneous tissue: Secondary | ICD-10-CM | POA: Diagnosis not present

## 2020-10-13 DIAGNOSIS — Z85828 Personal history of other malignant neoplasm of skin: Secondary | ICD-10-CM | POA: Diagnosis not present

## 2020-10-13 DIAGNOSIS — D692 Other nonthrombocytopenic purpura: Secondary | ICD-10-CM | POA: Diagnosis not present

## 2020-10-16 ENCOUNTER — Other Ambulatory Visit: Payer: Self-pay | Admitting: Physician Assistant

## 2020-10-16 DIAGNOSIS — E785 Hyperlipidemia, unspecified: Secondary | ICD-10-CM

## 2020-10-27 DIAGNOSIS — R69 Illness, unspecified: Secondary | ICD-10-CM | POA: Diagnosis not present

## 2020-11-19 DIAGNOSIS — E785 Hyperlipidemia, unspecified: Secondary | ICD-10-CM | POA: Diagnosis not present

## 2020-11-19 DIAGNOSIS — K219 Gastro-esophageal reflux disease without esophagitis: Secondary | ICD-10-CM | POA: Diagnosis not present

## 2020-11-19 DIAGNOSIS — J309 Allergic rhinitis, unspecified: Secondary | ICD-10-CM | POA: Diagnosis not present

## 2020-11-19 DIAGNOSIS — Z803 Family history of malignant neoplasm of breast: Secondary | ICD-10-CM | POA: Diagnosis not present

## 2020-11-19 DIAGNOSIS — R69 Illness, unspecified: Secondary | ICD-10-CM | POA: Diagnosis not present

## 2020-11-19 DIAGNOSIS — Z7984 Long term (current) use of oral hypoglycemic drugs: Secondary | ICD-10-CM | POA: Diagnosis not present

## 2020-11-19 DIAGNOSIS — N529 Male erectile dysfunction, unspecified: Secondary | ICD-10-CM | POA: Diagnosis not present

## 2020-11-19 DIAGNOSIS — I10 Essential (primary) hypertension: Secondary | ICD-10-CM | POA: Diagnosis not present

## 2020-11-19 DIAGNOSIS — E119 Type 2 diabetes mellitus without complications: Secondary | ICD-10-CM | POA: Diagnosis not present

## 2020-11-19 DIAGNOSIS — Z008 Encounter for other general examination: Secondary | ICD-10-CM | POA: Diagnosis not present

## 2020-11-19 DIAGNOSIS — I2510 Atherosclerotic heart disease of native coronary artery without angina pectoris: Secondary | ICD-10-CM | POA: Diagnosis not present

## 2020-11-26 ENCOUNTER — Telehealth: Payer: Self-pay | Admitting: Physician Assistant

## 2020-11-26 NOTE — Telephone Encounter (Signed)
Patient is scheduled for 12/03/2020 at 11 AM

## 2020-11-26 NOTE — Telephone Encounter (Signed)
The patient was wanting to know if he can Transfer care to you from Raiford Noble, PA-C  He is having to call around to find a doctor since his last day is Friday  Can we schedule this patient?

## 2020-11-26 NOTE — Telephone Encounter (Signed)
OK.  Will need 30 minute follow up.

## 2020-12-02 ENCOUNTER — Other Ambulatory Visit: Payer: Self-pay

## 2020-12-03 ENCOUNTER — Ambulatory Visit (INDEPENDENT_AMBULATORY_CARE_PROVIDER_SITE_OTHER): Payer: Medicare HMO | Admitting: Family Medicine

## 2020-12-03 ENCOUNTER — Encounter: Payer: Self-pay | Admitting: Family Medicine

## 2020-12-03 VITALS — BP 134/68 | HR 71 | Ht 69.0 in | Wt 137.0 lb

## 2020-12-03 DIAGNOSIS — I10 Essential (primary) hypertension: Secondary | ICD-10-CM

## 2020-12-03 DIAGNOSIS — E1151 Type 2 diabetes mellitus with diabetic peripheral angiopathy without gangrene: Secondary | ICD-10-CM

## 2020-12-03 LAB — POCT GLYCOSYLATED HEMOGLOBIN (HGB A1C): Hemoglobin A1C: 5.8 % — AB (ref 4.0–5.6)

## 2020-12-03 MED ORDER — METFORMIN HCL 1000 MG PO TABS: ORAL_TABLET | ORAL | 3 refills | Status: DC

## 2020-12-03 NOTE — Progress Notes (Signed)
Established Patient Office Visit  Subjective:  Patient ID: Walter [name], male    DOB: 1946-02-05  Age: 75 y.o. MRN: [id number]  CC: No chief complaint on file.   HPI Walter [name] presents for establishing care.  He is new to our office but not new to [place] so this is an established care visit.  Has previously seen Elyn Aquas at the [facility].  Chronic problems include history of hypertension, type 2 diabetes, Parkinson's disease, mild neurocognitive disorder.  Medications reviewed.  He takes Metformin, losartan, Lipitor, aspirin, amlodipine, Aricept.  He states he just had eye exam last month.  No foot problems.  Had labs back in September with A1c of 6.5%.  Lipid panel and comprehensive metabolic panel at that time were stable.  Does not monitor blood sugars regularly.  No polyuria or polydipsia.  Generally feels well.  Social history is that he is married.  He is retired from [employer].  He worked there for 35 years.  Quit smoking 2001.  Prior alcohol use but not for several years.  His first marriage lasted 44 years and he had 3 children from that marriage.  He remarried which is his current marriage and 7.  He has 1 daughter and 2 sons from first marriage and 4 grandchildren  Past Medical History:  Diagnosis Date  . Allergy   . Cancer [facility])    Skin cancer  . Cataract   . Coronary artery disease   . Diabetes mellitus   . Diverticulosis   . Esophageal stricture   . GERD (gastroesophageal reflux disease)   . Hyperlipidemia   . Hypertension   . Peripheral vascular disease [facility])     Past Surgical History:  Procedure Laterality Date  . CORONARY ANGIOPLASTY WITH STENT PLACEMENT    . ESOPHAGOGASTRODUODENOSCOPY  06/07/2012   Procedure: ESOPHAGOGASTRODUODENOSCOPY (EGD);  Surgeon: Lafayette Dragon, MD;  Location: [facility];  Service: Endoscopy;  Laterality: N/A;  . SHOULDER SURGERY  2006    Family History  Problem Relation Age of Onset  . Breast  cancer Mother   . Healthy Son   . Healthy Son   . Parkinson's disease Sister   . Colon cancer Neg Hx   . Esophageal cancer Neg Hx   . Rectal cancer Neg Hx   . Stomach cancer Neg Hx     Social History   Socioeconomic History  . Marital status: Married    Spouse name: Not on file  . Number of children: 3  . Years of education: Not on file  . Highest education level: Not on file  Occupational History  . Occupation: Auditor  Tobacco Use  . Smoking status: Former Smoker    Quit date: 11/02/1999    Years since quitting: 21.1  . Smokeless tobacco: Never Used  Vaping Use  . Vaping Use: Never used  Substance and Sexual Activity  . Alcohol use: No  . Drug use: No  . Sexual activity: Not Currently    Partners: Female  Other Topics Concern  . Not on file  Social History Narrative   Daily caffeine       Right handed      Lives with wife in a one story home      Social Determinants of Health   Financial Resource Strain: Low Risk   . Difficulty of Paying Living Expenses: Not hard at all  Food Insecurity: No Food Insecurity  . Worried About Charity fundraiser in the Last  Year: Never true  . Ran Out of Food in the Last Year: Never true  Transportation Needs: No Transportation Needs  . Lack of Transportation (Medical): No  . Lack of Transportation (Non-Medical): No  Physical Activity: Insufficiently Active  . Days of Exercise per Week: 3 days  . Minutes of Exercise per Session: 10 min  Stress: No Stress Concern Present  . Feeling of Stress : Not at all  Social Connections: Socially Integrated  . Frequency of Communication with Friends and Family: More than three times a week  . Frequency of Social Gatherings with Friends and Family: Once a week  . Attends Religious Services: More than 4 times per year  . Active Member of Clubs or Organizations: Yes  . Attends Archivist Meetings: More than 4 times per year  . Marital Status: Married  Human resources officer Violence:  Not on file    Outpatient Medications Prior to Visit  Medication Sig Dispense Refill  . amLODipine (NORVASC) 5 MG tablet Take 1 tablet by mouth once daily 90 tablet 0  . aspirin 81 MG tablet Take 81 mg by mouth daily.    Marland Kitchen atorvastatin (LIPITOR) 40 MG tablet Take 1 tablet by mouth once daily 90 tablet 0  . atropine 1 % ophthalmic solution Use as instructed in your mouth 2 mL 12  . Blood Glucose Monitoring Suppl (ONE TOUCH ULTRA 2) w/Device KIT 1 kit by Does not apply route daily. 1 kit 0  . cyanocobalamin 2000 MCG tablet Take 1 tablet (2,000 mcg total) by mouth daily. 30 tablet 0  . Dihydroxyaluminum Sod Carb (ROLAIDS PO) Take by mouth as needed.    . donepezil (ARICEPT) 10 MG tablet Take 1 tablet daily 90 tablet 3  . Esomeprazole Magnesium (NEXIUM PO) Take by mouth.    . fluticasone (FLONASE) 50 MCG/ACT nasal spray Place 2 sprays into both nostrils daily. 16 g 6  . glucose blood (ONETOUCH ULTRA) test strip Check blood sugars daily Dx:E11.51 100 each 12  . Lancets (ONETOUCH ULTRASOFT) lancets Check blood sugars once daily. Dx: E11.51 100 each 12  . losartan (COZAAR) 100 MG tablet Take 1 tablet by mouth once daily 90 tablet 1  . OVER THE COUNTER MEDICATION Allergy relief, one tablet daily.    . metFORMIN (GLUCOPHAGE) 1000 MG tablet TAKE 1 TABLET BY MOUTH TWICE DAILY WITH A MEAL 180 tablet 0   Facility-Administered Medications Prior to Visit  Medication Dose Route Frequency Provider Last Rate Last Admin  . incobotulinumtoxinA (XEOMIN) 100 units injection 100 Units  100 Units Intramuscular Q90 days Tat, Eustace Quail, DO   100 Units at 02/22/20 1035    No Known Allergies  ROS Review of Systems  Constitutional: Negative for fatigue and unexpected weight change.  Eyes: Negative for visual disturbance.  Respiratory: Negative for cough, chest tightness and shortness of breath.   Cardiovascular: Negative for chest pain, palpitations and leg swelling.  Endocrine: Negative for polydipsia and  polyuria.  Neurological: Negative for dizziness, syncope, weakness, light-headedness and headaches.      Objective:    Physical Exam Constitutional:      Appearance: He is well-developed and well-nourished.  HENT:     Right Ear: External ear normal.     Left Ear: External ear normal.     Mouth/Throat:     Mouth: Oropharynx is clear and moist.  Eyes:     Pupils: Pupils are equal, round, and reactive to light.  Neck:     Thyroid: No thyromegaly.  Cardiovascular:     Rate and Rhythm: Normal rate and regular rhythm.  Pulmonary:     Effort: Pulmonary effort is normal. No respiratory distress.     Breath sounds: Normal breath sounds. No wheezing or rales.  Musculoskeletal:        General: No edema.     Cervical back: Neck supple.     Right lower leg: No edema.     Left lower leg: No edema.  Skin:    Findings: No lesion.  Neurological:     Mental Status: He is alert and oriented to person, place, and time.     BP 134/68   Pulse 71   Ht 5' 9" (1.753 m)   Wt 137 lb (62.1 kg)   SpO2 97%   BMI 20.23 kg/m  Wt Readings from Last 3 Encounters:  12/03/20 137 lb (62.1 kg)  07/18/20 136 lb (61.7 kg)  06/10/20 142 lb (64.4 kg)     Health Maintenance Due  Topic Date Due  . FOOT EXAM  07/26/2019  . COVID-19 Vaccine (3 - Booster for Moderna series) 07/12/2020    There are no preventive care reminders to display for this patient.  Lab Results  Component Value Date   TSH 0.86 08/28/2018   Lab Results  Component Value Date   WBC 5.7 04/19/2019   HGB 12.0 (L) 04/19/2019   HCT 36.9 (L) 04/19/2019   MCV 87.4 04/19/2019   PLT 204.0 04/19/2019   Lab Results  Component Value Date   NA 140 07/18/2020   K 3.9 07/18/2020   CO2 29 07/18/2020   GLUCOSE 118 (H) 07/18/2020   BUN 20 07/18/2020   CREATININE 1.10 07/18/2020   BILITOT 0.5 07/18/2020   ALKPHOS 82 07/18/2020   AST 18 07/18/2020   ALT 16 07/18/2020   PROT 6.9 07/18/2020   ALBUMIN 4.4 07/18/2020   CALCIUM 9.6  07/18/2020   ANIONGAP 8 01/07/2019   GFR 65.22 07/18/2020   Lab Results  Component Value Date   CHOL 140 07/18/2020   Lab Results  Component Value Date   HDL 46.90 07/18/2020   Lab Results  Component Value Date   LDLCALC 71 07/18/2020   Lab Results  Component Value Date   TRIG 111.0 07/18/2020   Lab Results  Component Value Date   CHOLHDL 3 07/18/2020   Lab Results  Component Value Date   HGBA1C 5.8 (A) 12/03/2020      Assessment & Plan:   #1 type 2 diabetes well controlled with A1c today 5.8%.  Continue Metformin.  Continue with yearly eye exam.  Plan to recheck A1c every 6 months  #2 hypertension stable and at goal.  Continue losartan and amlodipine.  #3 dyslipidemia.  Goal LDL less than 70.  Continue Lipitor and will plan to recheck lipids and hepatic panel along with chemistries in 22-monthfollow-up  Meds ordered this encounter  Medications  . metFORMIN (GLUCOPHAGE) 1000 MG tablet    Sig: TAKE 1 TABLET BY MOUTH TWICE DAILY WITH A MEAL    Dispense:  180 tablet    Refill:  3    Follow-up: Return in about 6 months (around 06/02/2021).    BCarolann Littler MD

## 2021-01-10 ENCOUNTER — Other Ambulatory Visit: Payer: Self-pay | Admitting: Physician Assistant

## 2021-01-10 DIAGNOSIS — I10 Essential (primary) hypertension: Secondary | ICD-10-CM

## 2021-01-14 ENCOUNTER — Telehealth: Payer: Self-pay | Admitting: Family Medicine

## 2021-01-14 ENCOUNTER — Ambulatory Visit: Payer: Medicare HMO | Admitting: Neurology

## 2021-01-14 ENCOUNTER — Other Ambulatory Visit: Payer: Self-pay

## 2021-01-14 ENCOUNTER — Encounter: Payer: Self-pay | Admitting: Neurology

## 2021-01-14 VITALS — BP 144/68 | HR 72 | Ht 68.0 in | Wt 143.0 lb

## 2021-01-14 DIAGNOSIS — G20 Parkinson's disease: Secondary | ICD-10-CM

## 2021-01-14 DIAGNOSIS — G3184 Mild cognitive impairment, so stated: Secondary | ICD-10-CM | POA: Diagnosis not present

## 2021-01-14 DIAGNOSIS — I10 Essential (primary) hypertension: Secondary | ICD-10-CM

## 2021-01-14 MED ORDER — AMLODIPINE BESYLATE 5 MG PO TABS: 5.0000 mg | ORAL_TABLET | Freq: Every day | ORAL | 2 refills | Status: DC

## 2021-01-14 MED ORDER — DONEPEZIL HCL 10 MG PO TABS: ORAL_TABLET | ORAL | 3 refills | Status: DC

## 2021-01-14 NOTE — Telephone Encounter (Signed)
Rx sent in

## 2021-01-14 NOTE — Telephone Encounter (Signed)
Pt call and need a refill on  amLODipine (NORVASC) 5 MG tablet sent to  [facility], [place] Phone:  [phone]  Fax:  [phone]

## 2021-01-14 NOTE — Patient Instructions (Signed)
Good to see you! 1. Continue Donepezil 10mg  daily  2. I will check and see what we can do about the Botox  3. Follow-up in 6-8 months, call for any changes   FALL PRECAUTIONS: Be cautious when walking. Scan the area for obstacles that may increase the risk of trips and falls. When getting up in the mornings, sit up at the edge of the bed for a few minutes before getting out of bed. Consider elevating the bed at the head end to avoid drop of blood pressure when getting up. Walk always in a well-lit room (use night lights in the walls). Avoid area rugs or power cords from appliances in the middle of the walkways. Use a walker or a cane if necessary and consider physical therapy for balance exercise. Get your eyesight checked regularly.  FINANCIAL OVERSIGHT: Supervision, especially oversight when making financial decisions or transactions is also recommended as difficulties arise.  HOME SAFETY: Consider the safety of the kitchen when operating appliances like stoves, microwave oven, and blender. Consider having supervision and share cooking responsibilities until no longer able to participate in those. Accidents with firearms and other hazards in the house should be identified and addressed as well.  DRIVING: Regarding driving, in patients with progressive memory problems, driving will be impaired. We advise to have someone else do the driving if trouble finding directions or if minor accidents are reported. Independent driving assessment is available to determine safety of driving.  ABILITY TO BE LEFT ALONE: If patient is unable to contact 911 operator, consider using LifeLine, or when the need is there, arrange for someone to stay with patients. Smoking is a fire hazard, consider supervision or cessation. Risk of wandering should be assessed by caregiver and if detected at any point, supervision and safe proof recommendations should be instituted.  MEDICATION SUPERVISION: Inability to  self-administer medication needs to be constantly addressed. Implement a mechanism to ensure safe administration of the medications.  RECOMMENDATIONS FOR ALL PATIENTS WITH MEMORY PROBLEMS: 1. Continue to exercise (Recommend 30 minutes of walking everyday, or 3 hours every week) 2. Increase social interactions - continue going to [place] and enjoy social gatherings with friends and family 3. Eat healthy, avoid fried foods and eat more fruits and vegetables 4. Maintain adequate blood pressure, blood sugar, and blood cholesterol level. Reducing the risk of stroke and cardiovascular disease also helps promoting better memory. 5. Avoid stressful situations. Live a simple life and avoid aggravations. Organize your time and prepare for the next day in anticipation. 6. Sleep well, avoid any interruptions of sleep and avoid any distractions in the bedroom that may interfere with adequate sleep quality 7. Avoid sugar, avoid sweets as there is a strong link between excessive sugar intake, diabetes, and cognitive impairment The Mediterranean diet has been shown to help patients reduce the risk of progressive memory disorders and reduces cardiovascular risk. This includes eating fish, eat fruits and green leafy vegetables, nuts like almonds and hazelnuts, walnuts, and also use olive oil. Avoid fast foods and fried foods as much as possible. Avoid sweets and sugar as sugar use has been linked to worsening of memory function.
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
## 2021-01-15 ENCOUNTER — Telehealth: Payer: Medicare HMO

## 2021-01-24 ENCOUNTER — Other Ambulatory Visit: Payer: Self-pay | Admitting: Physician Assistant

## 2021-01-24 DIAGNOSIS — E785 Hyperlipidemia, unspecified: Secondary | ICD-10-CM

## 2021-02-05 NOTE — Telephone Encounter (Signed)
atorvastatin (LIPITOR) 40 MG tablet    [facility] [place], [place] Phone:  [phone]  Fax:  [phone]     The patient has been out of this medication for 3 days

## 2021-02-23 ENCOUNTER — Telehealth: Payer: Self-pay | Admitting: Family Medicine

## 2021-02-23 NOTE — Telephone Encounter (Signed)
Pt needs to be scheduled for 6 mth f/u around 06/02/21.  He needs to come fasting.

## 2021-03-16 ENCOUNTER — Telehealth: Payer: Self-pay | Admitting: Family Medicine

## 2021-03-16 DIAGNOSIS — I10 Essential (primary) hypertension: Secondary | ICD-10-CM

## 2021-03-16 MED ORDER — LOSARTAN POTASSIUM 100 MG PO TABS: 1.0000 | ORAL_TABLET | Freq: Every day | ORAL | 1 refills | Status: DC

## 2021-03-16 NOTE — Telephone Encounter (Signed)
Rx sent in. Nothing further needed.

## 2021-03-16 NOTE — Telephone Encounter (Signed)
Pt is calling in needing a refill on Rx losartan (COZAAR)100 MG  Pharm:  [facility] in [place], [place]

## 2021-04-20 ENCOUNTER — Ambulatory Visit: Payer: Medicare HMO

## 2021-04-22 ENCOUNTER — Ambulatory Visit (INDEPENDENT_AMBULATORY_CARE_PROVIDER_SITE_OTHER): Payer: Medicare HMO

## 2021-04-22 ENCOUNTER — Other Ambulatory Visit: Payer: Self-pay

## 2021-04-22 ENCOUNTER — Ambulatory Visit: Payer: Medicare HMO

## 2021-04-22 VITALS — BP 140/70 | HR 63 | Temp 97.8°F | Ht 68.0 in | Wt 138.0 lb

## 2021-04-22 DIAGNOSIS — Z0000 Encounter for general adult medical examination without abnormal findings: Secondary | ICD-10-CM | POA: Diagnosis not present

## 2021-04-22 NOTE — Progress Notes (Signed)
Subjective:   Walter [name] is a 75 y.o. male who presents for an Initial Medicare Annual Wellness Visit.  Review of Systems   n/a       Objective:    There were no vitals filed for this visit. There is no height or weight on file to calculate BMI.  Advanced Directives 01/14/2021 06/10/2020 04/14/2020 02/06/2020 10/08/2019 04/19/2019 01/06/2019  Does Patient Have a Medical Advance Directive? Yes Yes Yes No Yes Yes No  Type of Advance Directive - [place];Out of facility DNR (pink MOST or yellow form);Living will [place];Living will - - Living will -  Copy of [place] in Chart? - - No - copy requested - - - -  Would patient like information on creating a medical advance directive? - - - - - - No - Patient declined    Current Medications (verified) Outpatient Encounter Medications as of 04/22/2021  Medication Sig   amLODipine (NORVASC) 5 MG tablet Take 1 tablet (5 mg total) by mouth daily.   aspirin 81 MG tablet Take 81 mg by mouth daily. (Patient not taking: Reported on 01/14/2021)   atorvastatin (LIPITOR) 40 MG tablet Take 1 tablet by mouth once daily   atropine 1 % ophthalmic solution Use as instructed in your mouth   Blood Glucose Monitoring Suppl (ONE TOUCH ULTRA 2) w/Device KIT 1 kit by Does not apply route daily.   cyanocobalamin 2000 MCG tablet Take 1 tablet (2,000 mcg total) by mouth daily.   Dihydroxyaluminum Sod Carb (ROLAIDS PO) Take by mouth as needed. (Patient not taking: Reported on 01/14/2021)   donepezil (ARICEPT) 10 MG tablet Take 1 tablet daily   Esomeprazole Magnesium (NEXIUM PO) Take by mouth.   fluticasone (FLONASE) 50 MCG/ACT nasal spray Place 2 sprays into both nostrils daily. (Patient not taking: Reported on 01/14/2021)   glucose blood (ONETOUCH ULTRA) test strip Check blood sugars daily Dx:E11.51   Lancets (ONETOUCH ULTRASOFT) lancets Check blood sugars once daily. Dx: E11.51   losartan (COZAAR) 100 MG  tablet Take 1 tablet (100 mg total) by mouth daily.   metFORMIN (GLUCOPHAGE) 1000 MG tablet TAKE 1 TABLET BY MOUTH TWICE DAILY WITH A MEAL   OVER THE COUNTER MEDICATION Allergy relief, one tablet daily. (Patient not taking: Reported on 01/14/2021)   Facility-Administered Encounter Medications as of 04/22/2021  Medication   incobotulinumtoxinA (XEOMIN) 100 units injection 100 Units    Allergies (verified) Patient has no known allergies.   History: Past Medical History:  Diagnosis Date   Allergy    Cancer ([place])    Skin cancer   Cataract    Coronary artery disease    Diabetes mellitus    Diverticulosis    Esophageal stricture    GERD (gastroesophageal reflux disease)    Hyperlipidemia    Hypertension    Peripheral vascular disease ([place])    Past Surgical History:  Procedure Laterality Date   CORONARY ANGIOPLASTY WITH STENT PLACEMENT     ESOPHAGOGASTRODUODENOSCOPY  06/07/2012   Procedure: ESOPHAGOGASTRODUODENOSCOPY (EGD);  Surgeon: Lafayette Dragon, MD;  Location: [facility];  Service: Endoscopy;  Laterality: N/A;   SHOULDER SURGERY  2006   Family History  Problem Relation Age of Onset   Breast cancer Mother    Healthy Son    Healthy Son    Parkinson's disease Sister    Colon cancer Neg Hx    Esophageal cancer Neg Hx    Rectal cancer Neg Hx  Stomach cancer Neg Hx    Social History   Socioeconomic History   Marital status: Married    Spouse name: Not on file   Number of children: 3   Years of education: Not on file   Highest education level: Not on file  Occupational History   Occupation: Auditor  Tobacco Use   Smoking status: Former    Pack years: 0.00    Types: Cigarettes    Quit date: 11/02/1999    Years since quitting: 21.4   Smokeless tobacco: Never  Vaping Use   Vaping Use: Never used  Substance and Sexual Activity   Alcohol use: No   Drug use: No   Sexual activity: Not Currently    Partners: Female  Other Topics Concern   Not on file  Social History  Narrative   Daily caffeine       Right handed      Lives with wife in a one story home      Social Determinants of Health   Financial Resource Strain: Not on file  Food Insecurity: No Food Insecurity   Worried About Charity fundraiser in the Last Year: Never true   [place] in the Last Year: Never true  Transportation Needs: No Transportation Needs   Lack of Transportation (Medical): No   Lack of Transportation (Non-Medical): No  Physical Activity: Not on file  Stress: Not on file  Social Connections: Not on file    Tobacco Counseling Counseling given: Not Answered   Clinical Intake:                 Diabetic?yes Nutrition Risk Assessment:  Has the patient had any N/V/D within the last 2 months?  No  Does the patient have any non-healing wounds?  No  Has the patient had any unintentional weight loss or weight gain?  No   Diabetes:  Is the patient diabetic?  Yes  If diabetic, was a CBG obtained today?  No  Did the patient bring in their glucometer from home?  No  How often do you monitor your CBG's? daily.   Financial Strains and Diabetes Management:  Are you having any financial strains with the device, your supplies or your medication? No .  Does the patient want to be seen by Chronic Care Management for management of their diabetes?  No  Would the patient like to be referred to a Nutritionist or for Diabetic Management?  No   Diabetic Exams:  Diabetic Eye Exam: Completed 03/2021 Diabetic Foot Exam: Overdue, Pt has been advised about the importance in completing this exam. Pt is scheduled for diabetic foot exam on next office visit .          Activities of Daily Living No flowsheet data found.  Patient Care Team: Eulas Post, MD as PCP - General (Family Medicine) Cameron Sprang, MD as Consulting Physician (Neurology) Leticia Clas, [place] (Optometry) [name], [facility] as Pharmacist (Pharmacist)  Indicate any recent Medical  Services you may have received from other than [facility] providers in the past year (date may be approximate).     Assessment:   This is a routine wellness examination for Walter [name].  Hearing/Vision screen No results found.  Dietary issues and exercise activities discussed:     Goals Addressed   None    Depression Screen PHQ 2/9 Scores 04/14/2020 04/19/2019 02/02/2018 01/31/2017 10/06/2016 04/10/2015 04/02/2014  PHQ - 2 Score 0 0 0 0 0 0 0  PHQ-  9 Score - 0 - - - - -    Fall Risk Fall Risk  01/14/2021 06/10/2020 04/14/2020 02/06/2020 10/08/2019  Falls in the past year? 0 0 1 0 0  Number falls in past yr: 0 0 0 0 -  Injury with Fall? 0 0 0 0 -  Follow up - - Falls evaluation completed - -    FALL RISK PREVENTION PERTAINING TO THE HOME:  Any stairs in or around the home? No  If so, are there any without handrails? No  Home free of loose throw rugs in walkways, pet beds, electrical cords, etc? Yes  Adequate lighting in your home to reduce risk of falls? Yes   ASSISTIVE DEVICES UTILIZED TO PREVENT FALLS:  Life alert? No  Use of a cane, walker or w/c? No  Grab bars in the bathroom? No  Shower chair or bench in shower? Yes  Elevated toilet seat or a handicapped toilet? Yes   TIMED UP AND GO:  Was the test performed? Yes .  Length of time to ambulate 10 feet: 12 sec.   Gait slow and steady without use of assistive device  Cognitive Function: Normal cognitive status assessed by direct observation by this Nurse Health Advisor. No abnormalities found.   MMSE - Mini Mental State Exam 01/14/2021 04/19/2019  Orientation to time 5 5  Orientation to Place 5 5  Registration 3 3  Attention/ Calculation 4 3  Recall 2 3  Language- name 2 objects 2 2  Language- repeat 1 1  Language- follow 3 step command 3 3  Language- read & follow direction 1 1  Write a sentence 1 1  Copy design 1 1  Total score 28 28   Montreal Cognitive Assessment  10/08/2019 06/08/2019  Visuospatial/ Executive (0/5) - 2   Naming (0/3) - 2  Attention: Read list of digits (0/2) 1 2  Attention: Read list of letters (0/1) 1 1  Attention: Serial 7 subtraction starting at 100 (0/3) 3 3  Language: Repeat phrase (0/2) 1 1  Language : Fluency (0/1) 0 0  Abstraction (0/2) 1 0  Delayed Recall (0/5) 0 3  Orientation (0/6) 6 6  Total - 20  Adjusted Score (based on education) - 21   6CIT Screen 04/14/2020  What Year? 4 points  What month? 0 points  What time? 0 points  Count back from 20 0 points  Months in reverse 2 points  Repeat phrase 0 points  Total Score 6    Immunizations Immunization History  Administered Date(s) Administered   Fluad Quad(high Dose 65+) 07/18/2020   Influenza Split 08/16/2011   Influenza Whole 08/01/2006, 07/31/2008, 09/17/2009, 07/15/2010   Influenza, High Dose Seasonal PF 10/02/2014, 12/11/2015, 10/06/2016, 08/04/2017, 07/20/2018, 07/28/2019   Influenza,inj,Quad PF,6+ Mos 10/02/2013   Moderna Sars-Covid-2 Vaccination 12/13/2019, 01/10/2020   Pneumococcal Conjugate-13 04/02/2014   Pneumococcal Polysaccharide-23 08/01/2006, 03/12/2011   Td 03/16/2012   Tdap 03/16/2012   Zoster, Live 10/11/2011    TDAP status: Up to date  Flu Vaccine status: Up to date  Pneumococcal vaccine status: Up to date  Covid-19 vaccine status: Completed vaccines  Qualifies for Shingles Vaccine? Yes   Zostavax completed No   Shingrix Completed?: No.    Education has been provided regarding the importance of this vaccine. Patient has been advised to call insurance company to determine out of pocket expense if they have not yet received this vaccine. Advised may also receive vaccine at local pharmacy or Health Dept. Verbalized acceptance and  understanding.  Screening Tests Health Maintenance  Topic Date Due   Zoster Vaccines- Shingrix (1 of 2) Never done   COVID-19 Vaccine (3 - Booster for Moderna series) 06/11/2020   INFLUENZA VACCINE  06/01/2021   HEMOGLOBIN A1C  06/02/2021   OPHTHALMOLOGY  EXAM  11/05/2021   FOOT EXAM  12/03/2021   TETANUS/TDAP  03/16/2022   Hepatitis C Screening  Completed   PNA vac Low Risk Adult  Completed   HPV VACCINES  Aged Out    Health Maintenance  Health Maintenance Due  Topic Date Due   Zoster Vaccines- Shingrix (1 of 2) Never done   COVID-19 Vaccine (3 - Booster for Moderna series) 06/11/2020    Colorectal cancer screening: Type of screening: Colonoscopy. Completed 10/15/2019. Repeat every 5 years  Lung Cancer Screening: (Low Dose CT Chest recommended if Age 39-80 years, 30 pack-year currently smoking OR have quit w/in 15years.) does not qualify.   Lung Cancer Screening Referral: n/a  Additional Screening:  Hepatitis C Screening: does not qualify  Vision Screening: Recommended annual ophthalmology exams for early detection of glaucoma and other disorders of the eye. Is the patient up to date with their annual eye exam?  Yes  Who is the provider or what is the name of the office in which the patient attends annual eye exams? [facility]  If pt is not established with a provider, would they like to be referred to a provider to establish care? No .   Dental Screening: Recommended annual dental exams for proper oral hygiene  Community Resource Referral / Chronic Care Management: CRR required this visit?  No   CCM required this visit?  No      Plan:     I have personally reviewed and noted the following in the patient's chart:   Medical and social history Use of alcohol, tobacco or illicit drugs  Current medications and supplements including opioid prescriptions. Patient is not currently taking opioid prescriptions. Functional ability and status Nutritional status Physical activity Advanced directives List of other physicians Hospitalizations, surgeries, and ER visits in previous 12 months Vitals Screenings to include cognitive, depression, and falls Referrals and appointments  In addition, I have reviewed and  discussed with patient certain preventive protocols, quality metrics, and best practice recommendations. A written personalized care plan for preventive services as well as general preventive health recommendations were provided to patient.     Randel Pigg, LPN   [id number]/67/[id number]   Nurse Notes: none

## 2021-04-22 NOTE — Patient Instructions (Signed)
Walter [name] , Thank you for taking time to come for your Medicare Wellness Visit. I appreciate your ongoing commitment to your health goals. Please review the following plan we discussed and let me know if I can assist you in the future.   Screening recommendations/referrals: Colonoscopy: current 10/15/2019 Recommended yearly ophthalmology/optometry visit for glaucoma screening and checkup Recommended yearly dental visit for hygiene and checkup  Vaccinations: Influenza vaccine: due in fall 2022 Pneumococcal vaccine: completed series  Tdap vaccine: current due 2023 Shingles vaccine: will obtain local pharmacy   Advanced directives: will provide copies   Conditions/risks identified: none   Next appointment: none   Preventive Care 76 Years and Older, Male Preventive care refers to lifestyle choices and visits with your health care provider that can promote health and wellness. What does preventive care include? A yearly physical exam. This is also called an annual well check. Dental exams once or twice a year. Routine eye exams. Ask your health care provider how often you should have your eyes checked. Personal lifestyle choices, including: Daily care of your teeth and gums. Regular physical activity. Eating a healthy diet. Avoiding tobacco and drug use. Limiting alcohol use. Practicing safe sex. Taking low doses of aspirin every day. Taking vitamin and mineral supplements as recommended by your health care provider. What happens during an annual well check? The services and screenings done by your health care provider during your annual well check will depend on your age, overall health, lifestyle risk factors, and family history of disease. Counseling  Your health care provider may ask you questions about your: Alcohol use. Tobacco use. Drug use. Emotional well-being. Home and relationship well-being. Sexual activity. Eating habits. History of falls. Memory and ability  to understand (cognition). Work and work Statistician. Screening  You may have the following tests or measurements: Height, weight, and BMI. Blood pressure. Lipid and cholesterol levels. These may be checked every 5 years, or more frequently if you are over 76 years old. Skin check. Lung cancer screening. You may have this screening every year starting at age 76 if you have a 30-pack-year history of smoking and currently smoke or have quit within the past 15 years. Fecal occult blood test (FOBT) of the stool. You may have this test every year starting at age 76. Flexible sigmoidoscopy or colonoscopy. You may have a sigmoidoscopy every 5 years or a colonoscopy every 10 years starting at age 76. Prostate cancer screening. Recommendations will vary depending on your family history and other risks. Hepatitis C blood test. Hepatitis B blood test. Sexually transmitted disease (STD) testing. Diabetes screening. This is done by checking your blood sugar (glucose) after you have not eaten for a while (fasting). You may have this done every 1-3 years. Abdominal aortic aneurysm (AAA) screening. You may need this if you are a current or former smoker. Osteoporosis. You may be screened starting at age 76 if you are at high risk. Talk with your health care provider about your test results, treatment options, and if necessary, the need for more tests. Vaccines  Your health care provider may recommend certain vaccines, such as: Influenza vaccine. This is recommended every year. Tetanus, diphtheria, and acellular pertussis (Tdap, Td) vaccine. You may need a Td booster every 10 years. Zoster vaccine. You may need this after age 76. Pneumococcal 13-valent conjugate (PCV13) vaccine. One dose is recommended after age 19. Pneumococcal polysaccharide (PPSV23) vaccine. One dose is recommended after age 76. Talk to your health care provider about which screenings  and vaccines you need and how often you need  them. This information is not intended to replace advice given to you by your health care provider. Make sure you discuss any questions you have with your health care provider. Document Released: 11/14/2015 Document Revised: 07/07/2016 Document Reviewed: 08/19/2015 Elsevier Interactive Patient Education  2017 [employer] Prevention in the Home Falls can cause injuries. They can happen to people of all ages. There are many things you can do to make your home safe and to help prevent falls. What can I do on the outside of my home? Regularly fix the edges of walkways and driveways and fix any cracks. Remove anything that might make you trip as you walk through a door, such as a raised step or threshold. Trim any bushes or trees on the path to your home. Use bright outdoor lighting. Clear any walking paths of anything that might make someone trip, such as rocks or tools. Regularly check to see if handrails are loose or broken. Make sure that both sides of any steps have handrails. Any raised decks and porches should have guardrails on the edges. Have any leaves, snow, or ice cleared regularly. Use sand or salt on walking paths during winter. Clean up any spills in your garage right away. This includes oil or grease spills. What can I do in the bathroom? Use night lights. Install grab bars by the toilet and in the tub and shower. Do not use towel bars as grab bars. Use non-skid mats or decals in the tub or shower. If you need to sit down in the shower, use a plastic, non-slip stool. Keep the floor dry. Clean up any water that spills on the floor as soon as it happens. Remove soap buildup in the tub or shower regularly. Attach bath mats securely with double-sided non-slip rug tape. Do not have throw rugs and other things on the floor that can make you trip. What can I do in the bedroom? Use night lights. Make sure that you have a light by your bed that is easy to reach. Do not use  any sheets or blankets that are too big for your bed. They should not hang down onto the floor. Have a firm chair that has side arms. You can use this for support while you get dressed. Do not have throw rugs and other things on the floor that can make you trip. What can I do in the kitchen? Clean up any spills right away. Avoid walking on wet floors. Keep items that you use a lot in easy-to-reach places. If you need to reach something above you, use a strong step stool that has a grab bar. Keep electrical cords out of the way. Do not use floor polish or wax that makes floors slippery. If you must use wax, use non-skid floor wax. Do not have throw rugs and other things on the floor that can make you trip. What can I do with my stairs? Do not leave any items on the stairs. Make sure that there are handrails on both sides of the stairs and use them. Fix handrails that are broken or loose. Make sure that handrails are as long as the stairways. Check any carpeting to make sure that it is firmly attached to the stairs. Fix any carpet that is loose or worn. Avoid having throw rugs at the top or bottom of the stairs. If you do have throw rugs, attach them to the floor with carpet tape.  Make sure that you have a light switch at the top of the stairs and the bottom of the stairs. If you do not have them, ask someone to add them for you. What else can I do to help prevent falls? Wear shoes that: Do not have high heels. Have rubber bottoms. Are comfortable and fit you well. Are closed at the toe. Do not wear sandals. If you use a stepladder: Make sure that it is fully opened. Do not climb a closed stepladder. Make sure that both sides of the stepladder are locked into place. Ask someone to hold it for you, if possible. Clearly mark and make sure that you can see: Any grab bars or handrails. First and last steps. Where the edge of each step is. Use tools that help you move around (mobility aids)  if they are needed. These include: Canes. Walkers. Scooters. Crutches. Turn on the lights when you go into a dark area. Replace any light bulbs as soon as they burn out. Set up your furniture so you have a clear path. Avoid moving your furniture around. If any of your floors are uneven, fix them. If there are any pets around you, be aware of where they are. Review your medicines with your doctor. Some medicines can make you feel dizzy. This can increase your chance of falling. Ask your doctor what other things that you can do to help prevent falls. This information is not intended to replace advice given to you by your health care provider. Make sure you discuss any questions you have with your health care provider. Document Released: 08/14/2009 Document Revised: 03/25/2016 Document Reviewed: 11/22/2014 Elsevier Interactive Patient Education  2017 [employer].

## 2021-05-06 ENCOUNTER — Other Ambulatory Visit: Payer: Self-pay | Admitting: Family Medicine

## 2021-05-06 DIAGNOSIS — E785 Hyperlipidemia, unspecified: Secondary | ICD-10-CM

## 2021-08-04 ENCOUNTER — Other Ambulatory Visit: Payer: Self-pay | Admitting: Family Medicine

## 2021-08-04 DIAGNOSIS — E785 Hyperlipidemia, unspecified: Secondary | ICD-10-CM

## 2021-08-25 DIAGNOSIS — Z23 Encounter for immunization: Secondary | ICD-10-CM | POA: Diagnosis not present

## 2021-09-12 ENCOUNTER — Other Ambulatory Visit: Payer: Self-pay | Admitting: Family Medicine

## 2021-09-12 DIAGNOSIS — I10 Essential (primary) hypertension: Secondary | ICD-10-CM

## 2021-09-14 DIAGNOSIS — D485 Neoplasm of uncertain behavior of skin: Secondary | ICD-10-CM | POA: Diagnosis not present

## 2021-09-14 DIAGNOSIS — C44311 Basal cell carcinoma of skin of nose: Secondary | ICD-10-CM | POA: Diagnosis not present

## 2021-09-14 DIAGNOSIS — L570 Actinic keratosis: Secondary | ICD-10-CM | POA: Diagnosis not present

## 2021-10-02 DIAGNOSIS — H18059 Posterior corneal pigmentations, unspecified eye: Secondary | ICD-10-CM | POA: Diagnosis not present

## 2021-10-08 ENCOUNTER — Telehealth (INDEPENDENT_AMBULATORY_CARE_PROVIDER_SITE_OTHER): Payer: Medicare HMO | Admitting: Family Medicine

## 2021-10-08 ENCOUNTER — Encounter: Payer: Self-pay | Admitting: Family Medicine

## 2021-10-08 ENCOUNTER — Other Ambulatory Visit: Payer: Self-pay

## 2021-10-08 DIAGNOSIS — R0981 Nasal congestion: Secondary | ICD-10-CM

## 2021-10-08 DIAGNOSIS — R059 Cough, unspecified: Secondary | ICD-10-CM | POA: Diagnosis not present

## 2021-10-08 MED ORDER — BENZONATATE 100 MG PO CAPS: ORAL_CAPSULE | ORAL | 0 refills | Status: DC

## 2021-10-08 NOTE — Patient Instructions (Signed)
  HOME CARE TIPS:   -I sent the medication(s) we discussed to your pharmacy: Meds ordered this encounter  Medications   benzonatate (TESSALON PERLES) 100 MG capsule    Sig: 1-2 capsules up to twice daily    Dispense:  30 capsule    Refill:  0   -can use nasal saline a few times per day if you have nasal congestion  -stay hydrated, drink plenty of fluids and eat small healthy meals - avoid dairy  -follow up with your doctor in 2-3 days unless improving and feeling better  -stay home while sick, except to seek medical care.  It was nice to meet you today, and I really hope you are feeling better soon. I help [facility] out with telemedicine visits on Tuesdays and Thursdays and am happy to help if you need a follow up virtual visit on those days. Otherwise, if you have any concerns or questions following this visit please schedule a follow up visit with your Primary Care doctor or seek care at a local urgent care clinic to avoid delays in care.    Seek in person care or schedule a follow up video visit promptly if your symptoms worsen, new concerns arise or you are not improving with treatment. Call 911 and/or seek emergency care if your symptoms are severe or life threatening.

## 2021-10-08 NOTE — Progress Notes (Signed)
Virtual Visit via Video Note  I connected with Walter [name]  on 10/08/21 at 10:40 AM EST by a video enabled telemedicine application and verified that I am speaking with the correct person using two identifiers.  Location patient: home, [place] Location provider:work or home office Persons participating in the virtual visit: patient, provider  I discussed the limitations of evaluation and management by telemedicine and the availability of in person appointments. The patient expressed understanding and agreed to proceed.   HPI:  Acute telemedicine visit for runny nose: -Onset:  2 days ago -wife is sick too - wife is negative covid, pt negative for covid as well -Symptoms include:sore throat, nasal congestion, cough, some muck in the eye -Denies:fevers, body aches, NVD, CP, SOB, inability to eat/drink -Pertinent past medical history:see below -Pertinent medication allergies: No Known Allergies -COVID-19 vaccine status: Immunization History  Administered Date(s) Administered   Fluad Quad(high Dose 65+) 07/18/2020   Influenza Split 08/16/2011   Influenza Whole 08/01/2006, 07/31/2008, 09/17/2009, 07/15/2010   Influenza, High Dose Seasonal PF 10/02/2014, 12/11/2015, 10/06/2016, 08/04/2017, 07/20/2018, 07/28/2019   Influenza,inj,Quad PF,6+ Mos 10/02/2013   Moderna Sars-Covid-2 Vaccination 12/13/2019, 01/10/2020   Pneumococcal Conjugate-13 04/02/2014   Pneumococcal Polysaccharide-23 08/01/2006, 03/12/2011   Td 03/16/2012   Tdap 03/16/2012   Zoster, Live 10/11/2011    ROS: See pertinent positives and negatives per HPI.  Past Medical History:  Diagnosis Date   Allergy    Cancer [facility])    Skin cancer   Cataract    Coronary artery disease    Diabetes mellitus    Diverticulosis    Esophageal stricture    GERD (gastroesophageal reflux disease)    Hyperlipidemia    Hypertension    Peripheral vascular disease ([place])     Past Surgical History:  Procedure Laterality Date   CORONARY  ANGIOPLASTY WITH STENT PLACEMENT     ESOPHAGOGASTRODUODENOSCOPY  06/07/2012   Procedure: ESOPHAGOGASTRODUODENOSCOPY (EGD);  Surgeon: Lafayette Dragon, MD;  Location: [facility];  Service: Endoscopy;  Laterality: N/A;   SHOULDER SURGERY  2006     Current Outpatient Medications:    amLODipine (NORVASC) 5 MG tablet, Take 1 tablet (5 mg total) by mouth daily., Disp: 90 tablet, Rfl: 2   atorvastatin (LIPITOR) 40 MG tablet, Take 1 tablet by mouth once daily, Disp: 90 tablet, Rfl: 0   atropine 1 % ophthalmic solution, Use as instructed in your mouth, Disp: 2 mL, Rfl: 12   benzonatate (TESSALON PERLES) 100 MG capsule, 1-2 capsules up to twice daily, Disp: 30 capsule, Rfl: 0   Blood Glucose Monitoring Suppl (ONE TOUCH ULTRA 2) w/Device KIT, 1 kit by Does not apply route daily., Disp: 1 kit, Rfl: 0   cyanocobalamin 2000 MCG tablet, Take 1 tablet (2,000 mcg total) by mouth daily., Disp: 30 tablet, Rfl: 0   Dihydroxyaluminum Sod Carb (ROLAIDS PO), Take by mouth as needed., Disp: , Rfl:    donepezil (ARICEPT) 10 MG tablet, Take 1 tablet daily, Disp: 90 tablet, Rfl: 3   Esomeprazole Magnesium (NEXIUM PO), Take by mouth., Disp: , Rfl:    glucose blood (ONETOUCH ULTRA) test strip, Check blood sugars daily Dx:E11.51, Disp: 100 each, Rfl: 12   Lancets (ONETOUCH ULTRASOFT) lancets, Check blood sugars once daily. Dx: E11.51, Disp: 100 each, Rfl: 12   losartan (COZAAR) 100 MG tablet, Take 1 tablet by mouth once daily, Disp: 90 tablet, Rfl: 0   metFORMIN (GLUCOPHAGE) 1000 MG tablet, TAKE 1 TABLET BY MOUTH TWICE DAILY WITH A MEAL, Disp: 180 tablet, Rfl:  3   OVER THE COUNTER MEDICATION, Allergy relief, one tablet daily., Disp: , Rfl:   Current Facility-Administered Medications:    incobotulinumtoxinA (XEOMIN) 100 units injection 100 Units, 100 Units, Intramuscular, Q90 days, Tat, Eustace Quail, DO, 100 Units at 02/22/20 1035  EXAM:  VITALS per patient if applicable:  GENERAL: alert, oriented, appears well and in no  acute distress  HEENT: atraumatic, conjunttiva clear, no obvious abnormalities on inspection of external nose and ears  NECK: normal movements of the head and neck  LUNGS: on inspection no signs of respiratory distress, breathing rate appears normal, no obvious gross SOB, gasping or wheezing  CV: no obvious cyanosis  MS: moves all visible extremities without noticeable abnormality  PSYCH/NEURO: pleasant and cooperative, no obvious depression or anxiety, speech and thought processing grossly intact  ASSESSMENT AND PLAN:  Discussed the following assessment and plan:  Cough, unspecified type  Nasal congestion  -we discussed possible serious and likely etiologies, options for evaluation and workup, limitations of telemedicine visit vs in person visit, treatment, treatment risks and precautions. Pt is agreeable to treatment via telemedicine at this moment. Wife had a "zpack" for her symptoms so she thought pt should maybe take a zpack as well. Discussed that this is likely a viral illness and advised of indications and risks with abx. Patient agreeable to trying symptomatic measures per patient instructions. Also, sent cough rx. Advised low thresh hold to seek prompt follow up or in person care if worsening, new symptoms arise, or if is not improving with treatment. Did let this patient know that I do telemedicine on Tuesdays and Thursdays for [place]. Advised to schedule follow up visit with PCP or [facility] if any further questions or concerns to avoid delays in care.   I discussed the assessment and treatment plan with the patient. The patient was provided an opportunity to ask questions and all were answered. The patient agreed with the plan and demonstrated an understanding of the instructions.     Lucretia Kern, DO

## 2021-10-12 ENCOUNTER — Telehealth: Payer: Self-pay | Admitting: Neurology

## 2021-10-12 NOTE — Telephone Encounter (Signed)
Patients wife called in, wants to let aquino know she thinks Walter [name] needs to be on namenda along with aricept. She said she doesn't want to mention this in front of him. [phone]

## 2021-10-13 ENCOUNTER — Ambulatory Visit: Payer: Medicare HMO | Admitting: Neurology

## 2021-10-13 ENCOUNTER — Encounter: Payer: Self-pay | Admitting: Neurology

## 2021-10-13 ENCOUNTER — Other Ambulatory Visit: Payer: Self-pay

## 2021-10-13 VITALS — BP 152/72 | HR 77 | Ht 68.0 in | Wt 140.0 lb

## 2021-10-13 DIAGNOSIS — G3184 Mild cognitive impairment, so stated: Secondary | ICD-10-CM | POA: Diagnosis not present

## 2021-10-13 DIAGNOSIS — G20 Parkinson's disease: Secondary | ICD-10-CM | POA: Diagnosis not present

## 2021-10-13 MED ORDER — DONEPEZIL HCL 10 MG PO TABS: ORAL_TABLET | ORAL | 3 refills | Status: DC

## 2021-10-13 MED ORDER — MEMANTINE HCL 10 MG PO TABS: ORAL_TABLET | ORAL | 11 refills | Status: DC

## 2021-10-13 NOTE — Telephone Encounter (Signed)
Noted  
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
## 2021-10-13 NOTE — Patient Instructions (Signed)
Start Memantine 10mg : Take 1 tablet every night for 2 weeks, then increase to 1 tablet twice a day  2. Continue Donepezil 10mg  daily  3. Continue to monitor driving  4. Follow-up in 6 months, call for any changes

## 2021-10-16 ENCOUNTER — Other Ambulatory Visit: Payer: Self-pay | Admitting: Family Medicine

## 2021-10-16 DIAGNOSIS — I10 Essential (primary) hypertension: Secondary | ICD-10-CM

## 2021-10-16 LAB — HM DIABETES EYE EXAM

## 2021-11-12 DIAGNOSIS — C44311 Basal cell carcinoma of skin of nose: Secondary | ICD-10-CM | POA: Diagnosis not present

## 2021-12-07 ENCOUNTER — Ambulatory Visit (INDEPENDENT_AMBULATORY_CARE_PROVIDER_SITE_OTHER): Payer: Medicare HMO | Admitting: Family Medicine

## 2021-12-07 VITALS — BP 124/60 | HR 77 | Temp 97.4°F | Wt 139.5 lb

## 2021-12-07 DIAGNOSIS — E785 Hyperlipidemia, unspecified: Secondary | ICD-10-CM

## 2021-12-07 DIAGNOSIS — R739 Hyperglycemia, unspecified: Secondary | ICD-10-CM | POA: Diagnosis not present

## 2021-12-07 DIAGNOSIS — E1151 Type 2 diabetes mellitus with diabetic peripheral angiopathy without gangrene: Secondary | ICD-10-CM | POA: Diagnosis not present

## 2021-12-07 DIAGNOSIS — I10 Essential (primary) hypertension: Secondary | ICD-10-CM | POA: Diagnosis not present

## 2021-12-07 LAB — BASIC METABOLIC PANEL
BUN: 21 mg/dL (ref 6–23)
CO2: 27 mEq/L (ref 19–32)
Calcium: 9.3 mg/dL (ref 8.4–10.5)
Chloride: 104 mEq/L (ref 96–112)
Creatinine, Ser: 1.22 mg/dL (ref 0.40–1.50)
GFR: 57.55 mL/min — ABNORMAL LOW (ref >60.00)
Glucose, Bld: 86 mg/dL (ref 70–99)
Potassium: 4.1 mEq/L (ref 3.5–5.1)
Sodium: 138 mEq/L (ref 135–145)

## 2021-12-07 LAB — HEPATIC FUNCTION PANEL
ALT: 18 U/L (ref 0–53)
AST: 22 U/L (ref 0–37)
Albumin: 4.5 g/dL (ref 3.5–5.2)
Alkaline Phosphatase: 85 U/L (ref 39–117)
Bilirubin, Direct: 0 mg/dL (ref 0.0–0.3)
Total Bilirubin: 0.3 mg/dL (ref 0.2–1.2)
Total Protein: 7.6 g/dL (ref 6.0–8.3)

## 2021-12-07 LAB — LIPID PANEL
Cholesterol: 138 mg/dL (ref 0–200)
HDL: 48.4 mg/dL (ref >39.00)
LDL Cholesterol: 73 mg/dL (ref 0–99)
NonHDL: 89.39
Total CHOL/HDL Ratio: 3
Triglycerides: 82 mg/dL (ref 0.0–149.0)
VLDL: 16.4 mg/dL (ref 0.0–40.0)

## 2021-12-07 LAB — POCT GLYCOSYLATED HEMOGLOBIN (HGB A1C): Hemoglobin A1C: 6 % — AB (ref 4.0–5.6)

## 2021-12-07 LAB — HEMOGLOBIN A1C: Hgb A1c MFr Bld: 6.3 % (ref 4.6–6.5)

## 2021-12-07 MED ORDER — ONETOUCH ULTRA VI STRP: ORAL_STRIP | 12 refills | Status: DC

## 2021-12-07 NOTE — Progress Notes (Signed)
Established Patient Office Visit  Subjective:  Patient ID: Walter [name], male    DOB: Sep 29, 1946  Age: 76 y.o. MRN: [id number]  CC:  Chief Complaint  Patient presents with   Follow-up    Diabetes and hypertension     HPI Walter [name] presents for medical follow-up.  He had a cough back in December and that has resolved.  His chronic problems include history of hypertension, type 2 diabetes, Parkinson's disease, cognitive impairment.  Followed by neurology.  Recently started on Namenda.  He is not sure this has made much difference.  His weight is actually up 2 pounds from a year ago.  He states his fasting blood sugars generally range between 100 and 130.  Medications reviewed.  He is on amlodipine, atorvastatin, B12, Aricept, losartan, Namenda, metformin.  Overdue for follow-up labs.  Denies any recent falls.  Appetite stable.  He is requesting handicap sticker.  He has difficulty ambulating more than 200 feet without stopping to rest.  Wt Readings from Last 3 Encounters:  12/07/21 139 lb 8 oz (63.3 kg)  10/13/21 140 lb (63.5 kg)  04/22/21 138 lb (62.6 kg)     Past Medical History:  Diagnosis Date   Allergy    Cancer ([place])    Skin cancer   Cataract    Coronary artery disease    Diabetes mellitus    Diverticulosis    Esophageal stricture    GERD (gastroesophageal reflux disease)    Hyperlipidemia    Hypertension    Peripheral vascular disease ([place])     Past Surgical History:  Procedure Laterality Date   CORONARY ANGIOPLASTY WITH STENT PLACEMENT     ESOPHAGOGASTRODUODENOSCOPY  06/07/2012   Procedure: ESOPHAGOGASTRODUODENOSCOPY (EGD);  Surgeon: Lafayette Dragon, MD;  Location: [facility];  Service: Endoscopy;  Laterality: N/A;   SHOULDER SURGERY  2006    Family History  Problem Relation Age of Onset   Breast cancer Mother    Healthy Son    Healthy Son    Parkinson's disease Sister    Colon cancer Neg Hx    Esophageal cancer Neg Hx    Rectal cancer  Neg Hx    Stomach cancer Neg Hx     Social History   Socioeconomic History   Marital status: Married    Spouse name: Not on file   Number of children: 3   Years of education: Not on file   Highest education level: Not on file  Occupational History   Occupation: Auditor  Tobacco Use   Smoking status: Former    Types: Cigarettes    Quit date: 11/02/1999    Years since quitting: 22.1   Smokeless tobacco: Never  Vaping Use   Vaping Use: Never used  Substance and Sexual Activity   Alcohol use: No   Drug use: No   Sexual activity: Not Currently    Partners: Female  Other Topics Concern   Not on file  Social History Narrative   Daily caffeine       Right handed      Lives with wife in a one story home      Social Determinants of Health   Financial Resource Strain: Low Risk    Difficulty of Paying Living Expenses: Not hard at all  Food Insecurity: Not on file  Transportation Needs: Not on file  Physical Activity: Not on file  Stress: No Stress Concern Present   Feeling of Stress : Not at all  Social Connections:  Socially Integrated   Frequency of Communication with Friends and Family: Twice a week   Frequency of Social Gatherings with Friends and Family: Twice a week   Attends Religious Services: More than 4 times per year   Active Member of [employer] or Organizations: Yes   Attends Music therapist: More than 4 times per year   Marital Status: Married  Human resources officer Violence: Not At Risk   Fear of Current or Ex-Partner: No   Emotionally Abused: No   Physically Abused: No   Sexually Abused: No    Outpatient Medications Prior to Visit  Medication Sig Dispense Refill   amLODipine (NORVASC) 5 MG tablet Take 1 tablet by mouth once daily 90 tablet 0   atorvastatin (LIPITOR) 40 MG tablet Take 1 tablet by mouth once daily 90 tablet 0   atropine 1 % ophthalmic solution Use as instructed in your mouth 2 mL 12   Blood Glucose Monitoring Suppl (ONE TOUCH ULTRA  2) w/Device KIT 1 kit by Does not apply route daily. 1 kit 0   cyanocobalamin 2000 MCG tablet Take 1 tablet (2,000 mcg total) by mouth daily. 30 tablet 0   Dihydroxyaluminum Sod Carb (ROLAIDS PO) Take by mouth as needed.     donepezil (ARICEPT) 10 MG tablet Take 1 tablet daily 90 tablet 3   Esomeprazole Magnesium (NEXIUM PO) Take by mouth.     Lancets (ONETOUCH ULTRASOFT) lancets Check blood sugars once daily. Dx: E11.51 100 each 12   losartan (COZAAR) 100 MG tablet Take 1 tablet by mouth once daily 90 tablet 0   memantine (NAMENDA) 10 MG tablet Take 1 tablet every night for 2 weeks then increase to 1 tablet twice a day 60 tablet 11   metFORMIN (GLUCOPHAGE) 1000 MG tablet TAKE 1 TABLET BY MOUTH TWICE DAILY WITH A MEAL 180 tablet 3   OVER THE COUNTER MEDICATION Allergy relief, one tablet daily.     glucose blood (ONETOUCH ULTRA) test strip Check blood sugars daily Dx:E11.51 100 each 12   Facility-Administered Medications Prior to Visit  Medication Dose Route Frequency Provider Last Rate Last Admin   incobotulinumtoxinA (XEOMIN) 100 units injection 100 Units  100 Units Intramuscular Q90 days Tat, Eustace Quail, DO   100 Units at 02/22/20 1035    No Known Allergies  ROS Review of Systems  Constitutional:  Negative for fatigue.  Eyes:  Negative for visual disturbance.  Respiratory:  Negative for cough, chest tightness and shortness of breath.   Cardiovascular:  Negative for chest pain, palpitations and leg swelling.  Endocrine: Negative for polydipsia and polyuria.  Neurological:  Negative for dizziness, syncope, weakness, light-headedness and headaches.     Objective:    Physical Exam Vitals reviewed.  HENT:     Head: Normocephalic and atraumatic.  Cardiovascular:     Rate and Rhythm: Normal rate and regular rhythm.  Pulmonary:     Effort: Pulmonary effort is normal.     Breath sounds: Normal breath sounds.  Musculoskeletal:     Right lower leg: No edema.     Left lower leg: No  edema.    BP 124/60 (BP Location: Left Arm, Patient Position: Sitting, Cuff Size: Normal)    Pulse 77    Temp (!) 97.4 F (36.3 C) (Oral)    Wt 139 lb 8 oz (63.3 kg)    SpO2 98%    BMI 21.21 kg/m  Wt Readings from Last 3 Encounters:  12/07/21 139 lb 8 oz (63.3 kg)  10/13/21 140 lb (63.5 kg)  04/22/21 138 lb (62.6 kg)     Health Maintenance Due  Topic Date Due   Zoster Vaccines- Shingrix (1 of 2) Never done   COVID-19 Vaccine (3 - Booster for Moderna series) 03/06/2020   INFLUENZA VACCINE  06/01/2021   HEMOGLOBIN A1C  06/02/2021   FOOT EXAM  12/03/2021    There are no preventive care reminders to display for this patient.  Lab Results  Component Value Date   TSH 0.86 08/28/2018   Lab Results  Component Value Date   WBC 5.7 04/19/2019   HGB 12.0 (L) 04/19/2019   HCT 36.9 (L) 04/19/2019   MCV 87.4 04/19/2019   PLT 204.0 04/19/2019   Lab Results  Component Value Date   NA 140 07/18/2020   K 3.9 07/18/2020   CO2 29 07/18/2020   GLUCOSE 118 (H) 07/18/2020   BUN 20 07/18/2020   CREATININE 1.10 07/18/2020   BILITOT 0.5 07/18/2020   ALKPHOS 82 07/18/2020   AST 18 07/18/2020   ALT 16 07/18/2020   PROT 6.9 07/18/2020   ALBUMIN 4.4 07/18/2020   CALCIUM 9.6 07/18/2020   ANIONGAP 8 01/07/2019   GFR 65.22 07/18/2020   Lab Results  Component Value Date   CHOL 140 07/18/2020   Lab Results  Component Value Date   HDL 46.90 07/18/2020   Lab Results  Component Value Date   LDLCALC 71 07/18/2020   Lab Results  Component Value Date   TRIG 111.0 07/18/2020   Lab Results  Component Value Date   CHOLHDL 3 07/18/2020   Lab Results  Component Value Date   HGBA1C 5.8 (A) 12/03/2020      Assessment & Plan:   Problem List Items Addressed This Visit       Unprioritized   Hyperglycemia   Diabetes mellitus with peripheral circulatory disorder ([place]) - Primary   Relevant Medications   glucose blood (ONETOUCH ULTRA) test strip   Other Relevant Orders   POCT  glycosylated hemoglobin (Hb A1C)   Hemoglobin A1c   Essential hypertension   Relevant Orders   Basic metabolic panel   Other Visit Diagnoses     Dyslipidemia       Relevant Orders   Lipid panel   Hepatic function panel     Multiple medical problems as above.  He is followed by neurology for his cognitive impairment.  -Obtain follow-up labs. -Refilled his One Touch ultra test strips -We filled out handicap sticker based on his difficulty walking more than 200 feet without stopping to rest. -We will recommend 76-monthroutine follow-up unless indicated sooner by lab work.  Meds ordered this encounter  Medications   glucose blood (ONETOUCH ULTRA) test strip    Sig: Check blood sugars daily Dx:E11.51    Dispense:  100 each    Refill:  12    Follow-up: Return in about 6 months (around 06/06/2022).    BCarolann Littler MD

## 2021-12-11 ENCOUNTER — Other Ambulatory Visit: Payer: Self-pay | Admitting: Family Medicine

## 2021-12-11 DIAGNOSIS — I10 Essential (primary) hypertension: Secondary | ICD-10-CM

## 2021-12-18 ENCOUNTER — Other Ambulatory Visit: Payer: Self-pay | Admitting: Family Medicine

## 2021-12-18 DIAGNOSIS — E785 Hyperlipidemia, unspecified: Secondary | ICD-10-CM

## 2022-01-02 ENCOUNTER — Other Ambulatory Visit: Payer: Self-pay | Admitting: Family Medicine

## 2022-01-02 DIAGNOSIS — E1151 Type 2 diabetes mellitus with diabetic peripheral angiopathy without gangrene: Secondary | ICD-10-CM

## 2022-01-18 ENCOUNTER — Other Ambulatory Visit: Payer: Self-pay | Admitting: Family Medicine

## 2022-01-18 DIAGNOSIS — I10 Essential (primary) hypertension: Secondary | ICD-10-CM

## 2022-02-10 DIAGNOSIS — L821 Other seborrheic keratosis: Secondary | ICD-10-CM | POA: Diagnosis not present

## 2022-02-10 DIAGNOSIS — D692 Other nonthrombocytopenic purpura: Secondary | ICD-10-CM | POA: Diagnosis not present

## 2022-02-10 DIAGNOSIS — Z85828 Personal history of other malignant neoplasm of skin: Secondary | ICD-10-CM | POA: Diagnosis not present

## 2022-02-10 DIAGNOSIS — L570 Actinic keratosis: Secondary | ICD-10-CM | POA: Diagnosis not present

## 2022-02-10 DIAGNOSIS — D225 Melanocytic nevi of trunk: Secondary | ICD-10-CM | POA: Diagnosis not present

## 2022-03-05 ENCOUNTER — Other Ambulatory Visit: Payer: Self-pay | Admitting: Family Medicine

## 2022-03-05 DIAGNOSIS — I10 Essential (primary) hypertension: Secondary | ICD-10-CM

## 2022-03-19 ENCOUNTER — Other Ambulatory Visit: Payer: Self-pay | Admitting: Family Medicine

## 2022-03-19 DIAGNOSIS — E785 Hyperlipidemia, unspecified: Secondary | ICD-10-CM

## 2022-03-23 DIAGNOSIS — I10 Essential (primary) hypertension: Secondary | ICD-10-CM | POA: Diagnosis not present

## 2022-03-23 DIAGNOSIS — Z85820 Personal history of malignant melanoma of skin: Secondary | ICD-10-CM | POA: Diagnosis not present

## 2022-03-23 DIAGNOSIS — I2510 Atherosclerotic heart disease of native coronary artery without angina pectoris: Secondary | ICD-10-CM | POA: Diagnosis not present

## 2022-03-23 DIAGNOSIS — N529 Male erectile dysfunction, unspecified: Secondary | ICD-10-CM | POA: Diagnosis not present

## 2022-03-23 DIAGNOSIS — E785 Hyperlipidemia, unspecified: Secondary | ICD-10-CM | POA: Diagnosis not present

## 2022-03-23 DIAGNOSIS — Z8249 Family history of ischemic heart disease and other diseases of the circulatory system: Secondary | ICD-10-CM | POA: Diagnosis not present

## 2022-03-23 DIAGNOSIS — E1151 Type 2 diabetes mellitus with diabetic peripheral angiopathy without gangrene: Secondary | ICD-10-CM | POA: Diagnosis not present

## 2022-03-23 DIAGNOSIS — Z7984 Long term (current) use of oral hypoglycemic drugs: Secondary | ICD-10-CM | POA: Diagnosis not present

## 2022-03-23 DIAGNOSIS — R69 Illness, unspecified: Secondary | ICD-10-CM | POA: Diagnosis not present

## 2022-03-23 DIAGNOSIS — Z803 Family history of malignant neoplasm of breast: Secondary | ICD-10-CM | POA: Diagnosis not present

## 2022-03-23 DIAGNOSIS — K219 Gastro-esophageal reflux disease without esophagitis: Secondary | ICD-10-CM | POA: Diagnosis not present

## 2022-04-09 ENCOUNTER — Other Ambulatory Visit: Payer: Self-pay | Admitting: Family Medicine

## 2022-04-09 DIAGNOSIS — E1151 Type 2 diabetes mellitus with diabetic peripheral angiopathy without gangrene: Secondary | ICD-10-CM

## 2022-04-23 ENCOUNTER — Ambulatory Visit (INDEPENDENT_AMBULATORY_CARE_PROVIDER_SITE_OTHER): Payer: Medicare HMO

## 2022-04-23 VITALS — Ht 68.0 in | Wt 139.0 lb

## 2022-04-23 DIAGNOSIS — Z0000 Encounter for general adult medical examination without abnormal findings: Secondary | ICD-10-CM

## 2022-04-23 NOTE — Progress Notes (Signed)
Subjective:   Walter [name] is a 76 y.o. male who presents for Medicare Annual/Subsequent preventive examination.  Review of Systems    Virtual Visit via Telephone Note  I connected with  Walter [name] on 04/23/22 at 10:30 AM EDT by telephone and verified that I am speaking with the correct person using two identifiers.  Location: Patient: Home Provider: Office Persons participating in the virtual visit: patient/Nurse Health Advisor   I discussed the limitations, risks, security and privacy concerns of performing an evaluation and management service by telephone and the availability of in person appointments. The patient expressed understanding and agreed to proceed.  Interactive audio and video telecommunications were attempted between this nurse and patient, however failed, due to patient having technical difficulties OR patient did not have access to video capability.  We continued and completed visit with audio only.  Some vital signs may be absent or patient reported.   Tillie Rung, LPN  Cardiac Risk Factors include: advanced age (>65men, >59 women);diabetes mellitus;hypertension;male gender     Objective:    Today's Vitals   04/23/22 1027  Weight: 139 lb (63 kg)  Height: 5\' 8"  (1.727 m)   Body mass index is 21.13 kg/m.     04/23/2022   10:39 AM 10/13/2021    2:51 PM 04/22/2021    1:17 PM 01/14/2021   10:00 AM 06/10/2020    1:48 PM 04/14/2020    3:02 PM 02/06/2020    2:02 PM  Advanced Directives  Does Patient Have a Medical Advance Directive? Yes Yes Yes Yes Yes Yes No  Type of Estate agent of [place];Living will Healthcare Power of [employer] of [place];Living will  Healthcare Power of [place];Out of facility DNR (pink MOST or yellow form);Living will Healthcare Power of [place];Living will   Does patient want to make changes to medical advance directive? No - Patient declined        Copy of Healthcare Power of  Attorney in Chart? No - copy requested  No - copy requested   No - copy requested     Current Medications (verified) Outpatient Encounter Medications as of 04/23/2022  Medication Sig   amLODipine (NORVASC) 5 MG tablet Take 1 tablet by mouth once daily   atorvastatin (LIPITOR) 40 MG tablet Take 1 tablet by mouth once daily   atropine 1 % ophthalmic solution Use as instructed in your mouth   Blood Glucose Monitoring Suppl (ONE TOUCH ULTRA 2) w/Device KIT 1 kit by Does not apply route daily.   cyanocobalamin 2000 MCG tablet Take 1 tablet (2,000 mcg total) by mouth daily.   Dihydroxyaluminum Sod Carb (ROLAIDS PO) Take by mouth as needed.   donepezil (ARICEPT) 10 MG tablet Take 1 tablet daily   Esomeprazole Magnesium (NEXIUM PO) Take by mouth.   glucose blood (ONETOUCH ULTRA) test strip Check blood sugars daily Dx:E11.51   Lancets (ONETOUCH ULTRASOFT) lancets Check blood sugars once daily. Dx: E11.51   losartan (COZAAR) 100 MG tablet Take 1 tablet by mouth once daily   memantine (NAMENDA) 10 MG tablet Take 1 tablet every night for 2 weeks then increase to 1 tablet twice a day   metFORMIN (GLUCOPHAGE) 1000 MG tablet TAKE 1 TABLET BY MOUTH TWICE DAILY WITH A MEAL   OVER THE COUNTER MEDICATION Allergy relief, one tablet daily.   Facility-Administered Encounter Medications as of 04/23/2022  Medication   incobotulinumtoxinA (XEOMIN) 100 units injection 100 Units    Allergies (verified) Patient has no  known allergies.   History: Past Medical History:  Diagnosis Date   Allergy    Cancer ([facility])    Skin cancer   Cataract    Coronary artery disease    Diabetes mellitus    Diverticulosis    Esophageal stricture    GERD (gastroesophageal reflux disease)    Hyperlipidemia    Hypertension    Peripheral vascular disease ([facility])    Past Surgical History:  Procedure Laterality Date   CORONARY ANGIOPLASTY WITH STENT PLACEMENT     ESOPHAGOGASTRODUODENOSCOPY  06/07/2012   Procedure:  ESOPHAGOGASTRODUODENOSCOPY (EGD);  Surgeon: Hart Carwin, MD;  Location: [facility];  Service: Endoscopy;  Laterality: N/A;   SHOULDER SURGERY  2006   Family History  Problem Relation Age of Onset   Breast cancer Mother    Healthy Son    Healthy Son    Parkinson's disease Sister    Colon cancer Neg Hx    Esophageal cancer Neg Hx    Rectal cancer Neg Hx    Stomach cancer Neg Hx    Social History   Socioeconomic History   Marital status: Married    Spouse name: Not on file   Number of children: 3   Years of education: Not on file   Highest education level: Not on file  Occupational History   Occupation: Auditor  Tobacco Use   Smoking status: Former    Types: Cigarettes    Quit date: 11/02/1999    Years since quitting: 22.4   Smokeless tobacco: Never  Vaping Use   Vaping Use: Never used  Substance and Sexual Activity   Alcohol use: No   Drug use: No   Sexual activity: Not Currently    Partners: Female  Other Topics Concern   Not on file  Social History Narrative   Daily caffeine       Right handed      Lives with wife in a one story home      Social Determinants of Health   Financial Resource Strain: Low Risk  (04/23/2022)   Overall Financial Resource Strain (CARDIA)    Difficulty of Paying Living Expenses: Not hard at all  Food Insecurity: No Food Insecurity (04/23/2022)   Hunger Vital Sign    Worried About Running Out of Food in the Last Year: Never true    Ran Out of Food in the Last Year: Never true  Transportation Needs: No Transportation Needs (04/23/2022)   PRAPARE - Administrator, Civil Service (Medical): No    Lack of Transportation (Non-Medical): No  Physical Activity: Inactive (04/23/2022)   Exercise Vital Sign    Days of Exercise per Week: 0 days    Minutes of Exercise per Session: 0 min  Stress: No Stress Concern Present (04/23/2022)   [employer] of Occupational Health - Occupational Stress Questionnaire    Feeling of Stress :  Not at all  Social Connections: Socially Integrated (04/23/2022)   Social Connection and Isolation Panel [NHANES]    Frequency of Communication with Friends and Family: More than three times a week    Frequency of Social Gatherings with Friends and Family: More than three times a week    Attends Religious Services: More than 4 times per year    Active Member of [employer] or Organizations: Yes    Attends Engineer, structural: More than 4 times per year    Marital Status: Married     Clinical Intake: Nutrition Risk Assessment:  Has the  patient had any N/V/D within the last 2 months?  No  Does the patient have any non-healing wounds?  No  Has the patient had any unintentional weight loss or weight gain?  No   Diabetes:  Is the patient diabetic?  Yes  If diabetic, was a CBG obtained today?  Yes   CBG 93 Taken by patient Did the patient bring in their glucometer from home?  No  How often do you monitor your CBG's? Daily.   Financial Strains and Diabetes Management:  Are you having any financial strains with the device, your supplies or your medication? No .  Does the patient want to be seen by Chronic Care Management for management of their diabetes?  No  Would the patient like to be referred to a Nutritionist or for Diabetic Management?  No   Diabetic Exams:  Diabetic Eye Exam: Completed Yes. Overdue for diabetic eye exam. Pt has been advised about the importance in completing this exam. A referral has been placed today. Message sent to referral coordinator for scheduling purposes. Advised pt to expect a call from office referred to regarding appt.  Diabetic Foot Exam: Completed Yes. Pt has been advised about the importance in completing this exam. Pt is scheduled for diabetic foot exam on Followed by PCP.   Pre-visit preparation completed: No Diabetic?  Yes   Activities of Daily Living    04/23/2022   10:36 AM  In your present state of health, do you have any difficulty  performing the following activities:  Hearing? 0  Vision? 0  Difficulty concentrating or making decisions? 0  Walking or climbing stairs? 0  Dressing or bathing? 0  Doing errands, shopping? 0  Preparing Food and eating ? N  Using the Toilet? N  In the past six months, have you accidently leaked urine? N  Do you have problems with loss of bowel control? N  Managing your Medications? N  Managing your Finances? N  Housekeeping or managing your Housekeeping? N    Patient Care Team: Kristian Covey, MD as PCP - General (Family Medicine) Van Clines, MD as Consulting Physician (Neurology) Desiree Lucy, OD (Optometry) Dahlia Byes, [facility] as Pharmacist (Pharmacist)  Indicate any recent Medical Services you may have received from other than [facility] providers in the past year (date may be approximate).     Assessment:   This is a routine wellness examination for Walter [name].  Hearing/Vision screen Hearing Screening - Comments:: No hearing difficulty Vision Screening - Comments:: No vision difficulty. Followed by [facility]  Dietary issues and exercise activities discussed: Exercise limited by: None identified   Goals Addressed               This Visit's Progress     Patient Stated (pt-stated)        Maintain current health.       Depression Screen    04/23/2022   10:32 AM 12/07/2021   11:01 AM 04/22/2021    1:19 PM 04/22/2021    1:13 PM 04/14/2020    3:05 PM 04/19/2019    8:58 AM 02/02/2018    9:15 AM  PHQ 2/9 Scores  PHQ - 2 Score 0 0 0 0 0 0 0  PHQ- 9 Score      0     Fall Risk    04/23/2022   10:37 AM 12/07/2021   11:01 AM 10/13/2021    2:51 PM 04/22/2021    1:18 PM 01/14/2021   10:00  AM  Fall Risk   Falls in the past year? 1 0 1 1 0  Number falls in past yr: 0  0 0 0  Injury with Fall? 0  1 0 0  Risk for fall due to : No Fall Risks      Follow up Falls prevention discussed   Falls evaluation completed     FALL RISK PREVENTION PERTAINING TO THE  HOME:  Any stairs in or around the home? Yes  If so, are there any without handrails? No  Home free of loose throw rugs in walkways, pet beds, electrical cords, etc? Yes  Adequate lighting in your home to reduce risk of falls? Yes   ASSISTIVE DEVICES UTILIZED TO PREVENT FALLS:  Life alert? No  Use of a cane, walker or w/c? Yes  Grab bars in the bathroom? Yes  Shower chair or bench in shower? No  Elevated toilet seat or a handicapped toilet? No   TIMED UP AND GO:  Was the test performed? No . Audio Visit  Cognitive Function:    10/13/2021    3:00 PM 01/14/2021   10:00 AM 04/19/2019    7:05 PM  MMSE - Mini Mental State Exam  Orientation to time 4 5 5   Orientation to Place 5 5 5   Registration 3 3 3   Attention/ Calculation 4 4 3   Recall 2 2 3   Language- name 2 objects 2 2 2   Language- repeat 1 1 1   Language- follow 3 step command 3 3 3   Language- read & follow direction 1 1 1   Write a sentence 1 1 1   Copy design 0 1 1  Total score 26 28 28       10/08/2019   11:00 AM 06/08/2019   10:00 AM  Montreal Cognitive Assessment   Visuospatial/ Executive (0/5)  2  Naming (0/3)  2  Attention: Read list of digits (0/2) 1 2  Attention: Read list of letters (0/1) 1 1  Attention: Serial 7 subtraction starting at 100 (0/3) 3 3  Language: Repeat phrase (0/2) 1 1  Language : Fluency (0/1) 0 0  Abstraction (0/2) 1 0  Delayed Recall (0/5) 0 3  Orientation (0/6) 6 6  Total  20  Adjusted Score (based on education)  21      04/23/2022   10:39 AM 04/14/2020    3:08 PM  6CIT Screen  What Year? 0 points 4 points  What month? 0 points 0 points  What time? 0 points 0 points  Count back from 20 0 points 0 points  Months in reverse 0 points 2 points  Repeat phrase 0 points 0 points  Total Score 0 points 6 points    Immunizations Immunization History  Administered Date(s) Administered   Fluad Quad(high Dose 65+) 07/18/2020   Influenza Split 08/16/2011   Influenza Whole 08/01/2006,  07/31/2008, 09/17/2009, 07/15/2010   Influenza, High Dose Seasonal PF 10/02/2014, 12/11/2015, 10/06/2016, 08/04/2017, 07/20/2018, 07/28/2019   Influenza,inj,Quad PF,6+ Mos 10/02/2013   Moderna Sars-Covid-2 Vaccination 12/13/2019, 01/10/2020   Pneumococcal Conjugate-13 04/02/2014   Pneumococcal Polysaccharide-23 08/01/2006, 03/12/2011   Td 03/16/2012   Tdap 03/16/2012   Zoster, Live 10/11/2011    TDAP status: Up to date    Pneumococcal vaccine status: Up to date  Covid-19 vaccine status: Completed vaccines  Qualifies for Shingles Vaccine? Yes   Zostavax completed Yes   Shingrix Completed?: Yes  Screening Tests Health Maintenance  Topic Date Due   FOOT EXAM  12/03/2021   COVID-19  Vaccine (3 - Moderna series) 05/09/2022 (Originally 03/06/2020)   Zoster Vaccines- Shingrix (1 of 2) 07/24/2022 (Originally 05/02/1995)   TETANUS/TDAP  04/24/2023 (Originally 03/16/2022)   INFLUENZA VACCINE  06/01/2022   HEMOGLOBIN A1C  06/06/2022   OPHTHALMOLOGY EXAM  10/16/2022   Pneumonia Vaccine 76+ Years old  Completed   Hepatitis C Screening  Completed   HPV VACCINES  Aged Out   COLONOSCOPY (Pts 45-36yrs Insurance coverage will need to be confirmed)  Discontinued    Health Maintenance  Health Maintenance Due  Topic Date Due   FOOT EXAM  12/03/2021    Colorectal cancer screening: No longer required.   Lung Cancer Screening: (Low Dose CT Chest recommended if Age 70-80 years, 30 pack-year currently smoking OR have quit w/in 15years.) does not qualify.     Additional Screening:  Hepatitis C Screening: does qualify; Completed 02/02/18  Vision Screening: Recommended annual ophthalmology exams for early detection of glaucoma and other disorders of the eye. Is the patient up to date with their annual eye exam?  Yes  Who is the provider or what is the name of the office in which the patient attends annual eye exams? [facility] If pt is not established with a provider, would they like  to be referred to a provider to establish care? No .   Dental Screening: Recommended annual dental exams for proper oral hygiene  Community Resource Referral / Chronic Care Management:  CRR required this visit?  No   CCM required this visit?  No      Plan:     I have personally reviewed and noted the following in the patient's chart:   Medical and social history Use of alcohol, tobacco or illicit drugs  Current medications and supplements including opioid prescriptions. Patient is not currently taking opioid prescriptions. Functional ability and status Nutritional status Physical activity Advanced directives List of other physicians Hospitalizations, surgeries, and ER visits in previous 12 months Vitals Screenings to include cognitive, depression, and falls Referrals and appointments  In addition, I have reviewed and discussed with patient certain preventive protocols, quality metrics, and best practice recommendations. A written personalized care plan for preventive services as well as general preventive health recommendations were provided to patient.     Tillie Rung, LPN   [id number]/[id number]   Nurse Notes: None

## 2022-04-27 ENCOUNTER — Encounter: Payer: Self-pay | Admitting: Neurology

## 2022-04-27 ENCOUNTER — Ambulatory Visit: Payer: Medicare HMO | Admitting: Neurology

## 2022-04-27 VITALS — BP 106/60 | HR 84 | Ht 67.0 in | Wt 138.0 lb

## 2022-04-27 DIAGNOSIS — G20 Parkinson's disease: Secondary | ICD-10-CM

## 2022-04-27 DIAGNOSIS — G3184 Mild cognitive impairment, so stated: Secondary | ICD-10-CM

## 2022-04-27 MED ORDER — MEMANTINE HCL 10 MG PO TABS: ORAL_TABLET | ORAL | 3 refills | Status: DC

## 2022-04-27 MED ORDER — DONEPEZIL HCL 10 MG PO TABS
ORAL_TABLET | ORAL | 3 refills | Status: DC
Start: 2022-04-27 — End: 2022-10-29

## 2022-06-04 ENCOUNTER — Other Ambulatory Visit: Payer: Self-pay | Admitting: Family Medicine

## 2022-06-04 DIAGNOSIS — I10 Essential (primary) hypertension: Secondary | ICD-10-CM

## 2022-06-07 ENCOUNTER — Ambulatory Visit (INDEPENDENT_AMBULATORY_CARE_PROVIDER_SITE_OTHER): Payer: Medicare HMO | Admitting: Family Medicine

## 2022-06-07 ENCOUNTER — Encounter: Payer: Self-pay | Admitting: Family Medicine

## 2022-06-07 VITALS — BP 130/60 | HR 73 | Temp 97.8°F | Ht 67.0 in | Wt 137.4 lb

## 2022-06-07 DIAGNOSIS — E1151 Type 2 diabetes mellitus with diabetic peripheral angiopathy without gangrene: Secondary | ICD-10-CM

## 2022-06-07 DIAGNOSIS — I2510 Atherosclerotic heart disease of native coronary artery without angina pectoris: Secondary | ICD-10-CM | POA: Diagnosis not present

## 2022-06-07 DIAGNOSIS — I10 Essential (primary) hypertension: Secondary | ICD-10-CM | POA: Diagnosis not present

## 2022-06-07 LAB — POCT GLYCOSYLATED HEMOGLOBIN (HGB A1C): Hemoglobin A1C: 6 % — AB (ref 4.0–5.6)

## 2022-06-07 MED ORDER — LOSARTAN POTASSIUM 100 MG PO TABS: 100.0000 mg | ORAL_TABLET | Freq: Every day | ORAL | 0 refills | Status: DC

## 2022-06-07 NOTE — Progress Notes (Signed)
Established Patient Office Visit  Subjective   Patient ID: Walter [name], male    DOB: 1946-02-25  Age: 76 y.o. MRN: [id number]  Chief Complaint  Patient presents with   Follow-up    HPI   Walter [name] is seen for medical follow-up.  His history of Parkinson disease with mild cognitive impairment.  Followed by neurology.  Maintained on Namenda and Aricept.  Still stays fairly active.  Denies any recent falls.  He does have hypertension history treated with amlodipine 5 mg daily and losartan 100 mg daily.  He has history of type 2 diabetes and is on metformin 1000 mg twice daily.  Blood sugars been very stable.  He has hyperlipidemia treated with atorvastatin 40 mg daily.  Lipids were checked in February and stable.  Denies any recent chest pains.  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  Enjoys spending time when he can with his children and grandchildren now.  Past Medical History:  Diagnosis Date   Allergy    Cancer [facility])    Skin cancer   Cataract    Coronary artery disease    Diabetes mellitus    Diverticulosis    Esophageal stricture    GERD (gastroesophageal reflux disease)    Hyperlipidemia    Hypertension    Peripheral vascular disease ([place])    Past Surgical History:  Procedure Laterality Date   CORONARY ANGIOPLASTY WITH STENT PLACEMENT     ESOPHAGOGASTRODUODENOSCOPY  06/07/2012   Procedure: ESOPHAGOGASTRODUODENOSCOPY (EGD);  Surgeon: Lafayette Dragon, MD;  Location: [facility];  Service: Endoscopy;  Laterality: N/A;   SHOULDER SURGERY  2006    reports that he quit smoking about 22 years ago. His smoking use included cigarettes. He has never used smokeless tobacco. He reports that he does not drink alcohol and does not use drugs. family history includes Breast cancer in his mother; Healthy in his son and son; Parkinson's disease in his sister. No Known Allergies  Review of Systems  Constitutional:  Negative for malaise/fatigue.  Eyes:  Negative for blurred vision.   Respiratory:  Negative for shortness of breath.   Cardiovascular:  Negative for chest pain.  Neurological:  Negative for dizziness, weakness and headaches.      Objective:     BP 130/60 (BP Location: Left Arm, Patient Position: Sitting, Cuff Size: Normal)   Pulse 73   Temp 97.8 F (36.6 C) (Oral)   Ht '5\' 7"'$  (1.702 m)   Wt 137 lb 6.4 oz (62.3 kg)   SpO2 98%   BMI 21.52 kg/m  BP Readings from Last 3 Encounters:  06/07/22 130/60  04/27/22 106/60  12/07/21 124/60   Wt Readings from Last 3 Encounters:  06/07/22 137 lb 6.4 oz (62.3 kg)  04/27/22 138 lb (62.6 kg)  04/23/22 139 lb (63 kg)      Physical Exam Vitals reviewed.  Cardiovascular:     Rate and Rhythm: Normal rate and regular rhythm.  Pulmonary:     Effort: Pulmonary effort is normal.     Breath sounds: Normal breath sounds.  Musculoskeletal:     Right lower leg: No edema.     Left lower leg: No edema.  Neurological:     Mental Status: He is alert.      Results for orders placed or performed in visit on 06/07/22  POCT glycosylated hemoglobin (Hb A1C)  Result Value Ref Range   Hemoglobin A1C 6.0 (A) 4.0 - 5.6 %   HbA1c POC (<> result, manual  entry)     HbA1c, POC (prediabetic range)     HbA1c, POC (controlled diabetic range)      Last CBC Lab Results  Component Value Date   WBC 5.7 04/19/2019   HGB 12.0 (L) 04/19/2019   HCT 36.9 (L) 04/19/2019   MCV 87.4 04/19/2019   MCH 27.7 01/07/2019   RDW 14.3 04/19/2019   PLT 204.0 74/73/4037   Last metabolic panel Lab Results  Component Value Date   GLUCOSE 86 12/07/2021   NA 138 12/07/2021   K 4.1 12/07/2021   CL 104 12/07/2021   CO2 27 12/07/2021   BUN 21 12/07/2021   CREATININE 1.22 12/07/2021   GFRNONAA >60 01/07/2019   CALCIUM 9.3 12/07/2021   PROT 7.6 12/07/2021   ALBUMIN 4.5 12/07/2021   BILITOT 0.3 12/07/2021   ALKPHOS 85 12/07/2021   AST 22 12/07/2021   ALT 18 12/07/2021   ANIONGAP 8 01/07/2019   Last lipids Lab Results   Component Value Date   CHOL 138 12/07/2021   HDL 48.40 12/07/2021   LDLCALC 73 12/07/2021   TRIG 82.0 12/07/2021   CHOLHDL 3 12/07/2021   Last hemoglobin A1c Lab Results  Component Value Date   HGBA1C 6.0 (A) 06/07/2022   Last thyroid functions Lab Results  Component Value Date   TSH 0.86 08/28/2018      The 10-year ASCVD risk score (Arnett DK, et al., 2019) is: 49.9%    Assessment & Plan:   #1 type 2 diabetes controlled with A1c today 6.0%.  Continue current dose of metformin.  Recheck in 6 months.  Continue yearly diabetic eye exam  #2 hypertension stable.  Continue amlodipine 5 mg daily and losartan 100 mg daily.  Losartan refilled  #3 hyperlipidemia on atorvastatin 40 mg daily.  Recheck lipid and hepatic panel at 6-monthfollow-up     Return in about 6 months (around 12/08/2022).    BCarolann Littler MD

## 2022-06-07 NOTE — Patient Instructions (Signed)
Remember to get flu vaccine this Fall.

## 2022-07-09 ENCOUNTER — Other Ambulatory Visit: Payer: Self-pay | Admitting: Family Medicine

## 2022-07-09 DIAGNOSIS — E1151 Type 2 diabetes mellitus with diabetic peripheral angiopathy without gangrene: Secondary | ICD-10-CM

## 2022-07-22 ENCOUNTER — Other Ambulatory Visit: Payer: Self-pay | Admitting: Family Medicine

## 2022-07-22 DIAGNOSIS — I10 Essential (primary) hypertension: Secondary | ICD-10-CM

## 2022-09-03 ENCOUNTER — Other Ambulatory Visit: Payer: Self-pay | Admitting: Family Medicine

## 2022-09-03 DIAGNOSIS — I10 Essential (primary) hypertension: Secondary | ICD-10-CM

## 2022-09-17 ENCOUNTER — Other Ambulatory Visit: Payer: Self-pay | Admitting: Family Medicine

## 2022-09-17 DIAGNOSIS — E785 Hyperlipidemia, unspecified: Secondary | ICD-10-CM

## 2022-10-04 DIAGNOSIS — H35033 Hypertensive retinopathy, bilateral: Secondary | ICD-10-CM | POA: Diagnosis not present

## 2022-10-08 ENCOUNTER — Other Ambulatory Visit: Payer: Self-pay | Admitting: Family Medicine

## 2022-10-08 DIAGNOSIS — E1151 Type 2 diabetes mellitus with diabetic peripheral angiopathy without gangrene: Secondary | ICD-10-CM

## 2022-10-22 ENCOUNTER — Other Ambulatory Visit: Payer: Self-pay | Admitting: Family Medicine

## 2022-10-22 DIAGNOSIS — I10 Essential (primary) hypertension: Secondary | ICD-10-CM

## 2022-10-29 ENCOUNTER — Ambulatory Visit: Payer: Medicare HMO | Admitting: Neurology

## 2022-10-29 ENCOUNTER — Encounter: Payer: Self-pay | Admitting: Neurology

## 2022-10-29 VITALS — BP 154/68 | HR 88 | Ht 67.0 in | Wt 130.8 lb

## 2022-10-29 DIAGNOSIS — G20A1 Parkinson's disease without dyskinesia, without mention of fluctuations: Secondary | ICD-10-CM | POA: Diagnosis not present

## 2022-10-29 DIAGNOSIS — G3184 Mild cognitive impairment, so stated: Secondary | ICD-10-CM | POA: Diagnosis not present

## 2022-10-29 MED ORDER — MEMANTINE HCL 10 MG PO TABS: ORAL_TABLET | ORAL | 3 refills | Status: DC

## 2022-10-29 MED ORDER — DONEPEZIL HCL 10 MG PO TABS: ORAL_TABLET | ORAL | 3 refills | Status: DC

## 2022-10-29 NOTE — Patient Instructions (Signed)
Good to see you doing well. Continue all your medications. Follow-up in 6 months, call for any changes.    FALL PRECAUTIONS: Be cautious when walking. Scan the area for obstacles that may increase the risk of trips and falls. When getting up in the mornings, sit up at the edge of the bed for a few minutes before getting out of bed. Consider elevating the bed at the head end to avoid drop of blood pressure when getting up. Walk always in a well-lit room (use night lights in the walls). Avoid area rugs or power cords from appliances in the middle of the walkways. Use a walker or a cane if necessary and consider physical therapy for balance exercise. Get your eyesight checked regularly.  FINANCIAL OVERSIGHT: Supervision, especially oversight when making financial decisions or transactions is also recommended as difficulties arise.  HOME SAFETY: Consider the safety of the kitchen when operating appliances like stoves, microwave oven, and blender. Consider having supervision and share cooking responsibilities until no longer able to participate in those. Accidents with firearms and other hazards in the house should be identified and addressed as well.  DRIVING: Regarding driving, in patients with progressive memory problems, driving will be impaired. We advise to have someone else do the driving if trouble finding directions or if minor accidents are reported. Independent driving assessment is available to determine safety of driving.  ABILITY TO BE LEFT ALONE: If patient is unable to contact 911 operator, consider using LifeLine, or when the need is there, arrange for someone to stay with patients. Smoking is a fire hazard, consider supervision or cessation. Risk of wandering should be assessed by caregiver and if detected at any point, supervision and safe proof recommendations should be instituted.  MEDICATION SUPERVISION: Inability to self-administer medication needs to be constantly addressed. Implement  a mechanism to ensure safe administration of the medications.  RECOMMENDATIONS FOR ALL PATIENTS WITH MEMORY PROBLEMS: 1. Continue to exercise (Recommend 30 minutes of walking everyday, or 3 hours every week) 2. Increase social interactions - continue going to [place] and enjoy social gatherings with friends and family 3. Eat healthy, avoid fried foods and eat more fruits and vegetables 4. Maintain adequate blood pressure, blood sugar, and blood cholesterol level. Reducing the risk of stroke and cardiovascular disease also helps promoting better memory. 5. Avoid stressful situations. Live a simple life and avoid aggravations. Organize your time and prepare for the next day in anticipation. 6. Sleep well, avoid any interruptions of sleep and avoid any distractions in the bedroom that may interfere with adequate sleep quality 7. Avoid sugar, avoid sweets as there is a strong link between excessive sugar intake, diabetes, and cognitive impairment The Mediterranean diet has been shown to help patients reduce the risk of progressive memory disorders and reduces cardiovascular risk. This includes eating fish, eat fruits and green leafy vegetables, nuts like almonds and hazelnuts, walnuts, and also use olive oil. Avoid fast foods and fried foods as much as possible. Avoid sweets and sugar as sugar use has been linked to worsening of memory function.  There is always a concern of gradual progression of memory problems. If this is the case, then we may need to adjust level of care according to patient needs. Support, both to the patient and caregiver, should then be put into place.
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
## 2022-12-08 ENCOUNTER — Ambulatory Visit (INDEPENDENT_AMBULATORY_CARE_PROVIDER_SITE_OTHER): Payer: Medicare HMO | Admitting: Family Medicine

## 2022-12-08 VITALS — BP 120/62 | HR 80 | Temp 97.7°F | Ht 67.0 in | Wt 136.1 lb

## 2022-12-08 DIAGNOSIS — E1151 Type 2 diabetes mellitus with diabetic peripheral angiopathy without gangrene: Secondary | ICD-10-CM

## 2022-12-08 DIAGNOSIS — E538 Deficiency of other specified B group vitamins: Secondary | ICD-10-CM

## 2022-12-08 DIAGNOSIS — R739 Hyperglycemia, unspecified: Secondary | ICD-10-CM

## 2022-12-08 DIAGNOSIS — I10 Essential (primary) hypertension: Secondary | ICD-10-CM

## 2022-12-08 DIAGNOSIS — E785 Hyperlipidemia, unspecified: Secondary | ICD-10-CM

## 2022-12-08 LAB — COMPREHENSIVE METABOLIC PANEL
ALT: 16 U/L (ref 0–53)
AST: 22 U/L (ref 0–37)
Albumin: 4.3 g/dL (ref 3.5–5.2)
Alkaline Phosphatase: 81 U/L (ref 39–117)
BUN: 19 mg/dL (ref 6–23)
CO2: 26 mEq/L (ref 19–32)
Calcium: 8.9 mg/dL (ref 8.4–10.5)
Chloride: 106 mEq/L (ref 96–112)
Creatinine, Ser: 1.12 mg/dL (ref 0.40–1.50)
GFR: 63.32 mL/min (ref >60.00)
Glucose, Bld: 136 mg/dL — ABNORMAL HIGH (ref 70–99)
Potassium: 4.6 mEq/L (ref 3.5–5.1)
Sodium: 139 mEq/L (ref 135–145)
Total Bilirubin: 0.3 mg/dL (ref 0.2–1.2)
Total Protein: 7.1 g/dL (ref 6.0–8.3)

## 2022-12-08 LAB — VITAMIN B12: Vitamin B-12: 320 pg/mL (ref 211–911)

## 2022-12-08 LAB — POCT GLYCOSYLATED HEMOGLOBIN (HGB A1C): Hemoglobin A1C: 5.8 % — AB (ref 4.0–5.6)

## 2022-12-08 LAB — LIPID PANEL
Cholesterol: 131 mg/dL (ref 0–200)
HDL: 52.9 mg/dL (ref >39.00)
LDL Cholesterol: 65 mg/dL (ref 0–99)
NonHDL: 78.32
Total CHOL/HDL Ratio: 2
Triglycerides: 69 mg/dL (ref 0.0–149.0)
VLDL: 13.8 mg/dL (ref 0.0–40.0)

## 2022-12-08 NOTE — Progress Notes (Signed)
Established Patient Office Visit  Subjective   Patient ID: Walter [name], male    DOB: January 22, 1946  Age: 77 y.o. MRN: [id number]  No chief complaint on file.   HPI   Walter [name] is seen for routine 25-monthfollow-up.  He has history of type 2 diabetes, hypertension, GERD, esophageal stricture, mild cognitive impairment, Parkinson's disease, B12 deficiency.  He is followed by neurology.  He is maintained on Namenda and Aricept.  Other medications include amlodipine, atorvastatin, metformin, losartan.  His blood pressures been well-controlled.  Denies any recent orthostasis.  Denies recent falls.  No recent chest pains.  Compliant with medications.  Received flu vaccine this fall.  Pneumonia vaccines complete. No documented history of shingles vaccine  He states his appetite is good.  No weight loss since last visit  Past Medical History:  Diagnosis Date   Allergy    Cancer [place]    Skin cancer   Cataract    Coronary artery disease    Diabetes mellitus    Diverticulosis    Esophageal stricture    GERD (gastroesophageal reflux disease)    Hyperlipidemia    Hypertension    [place]    Past Surgical History:  Procedure Laterality Date   CORONARY ANGIOPLASTY WITH STENT PLACEMENT     ESOPHAGOGASTRODUODENOSCOPY  06/07/2012   Procedure: ESOPHAGOGASTRODUODENOSCOPY (EGD);  Surgeon: DLafayette Dragon MD;  [facility];  Service: Endoscopy;  Laterality: N/A;   SHOULDER SURGERY  2006    reports that he quit smoking about 23 years ago. His smoking use included cigarettes. He has never used smokeless tobacco. He reports that he does not drink alcohol and does not use drugs. family history includes Breast cancer in his mother; Healthy in his son and son; Parkinson's disease in his sister. No Known Allergies  Review of Systems  Constitutional:  Negative for malaise/fatigue and weight loss.  Eyes:  Negative for blurred vision.  Respiratory:  Negative for  shortness of breath.   Cardiovascular:  Negative for chest pain.  Gastrointestinal:  Negative for abdominal pain.  Neurological:  Negative for dizziness, weakness and headaches.      Objective:     BP 120/62 (BP Location: Left Arm, Patient Position: Sitting, Cuff Size: Normal)   Pulse 80   Temp 97.7 F (36.5 C) (Oral)   Ht '5\' 7"'$  (1.702 m)   Wt 136 lb 1.6 oz (61.7 kg)   SpO2 98%   BMI 21.32 kg/m  BP Readings from Last 3 Encounters:  12/08/22 120/62  10/29/22 (!) 154/68  06/07/22 130/60   Wt Readings from Last 3 Encounters:  12/08/22 136 lb 1.6 oz (61.7 kg)  10/29/22 130 lb 12.8 oz (59.3 kg)  06/07/22 137 lb 6.4 oz (62.3 kg)      Physical Exam Vitals reviewed.  Constitutional:      General: He is not in acute distress. Cardiovascular:     Rate and Rhythm: Normal rate and regular rhythm.  Pulmonary:     Effort: Pulmonary effort is normal.     Breath sounds: Normal breath sounds. No wheezing or rales.  Musculoskeletal:     Right lower leg: No edema.     Left lower leg: No edema.  Skin:    Comments: Feet reveal no skin lesions. Good distal foot pulses. Good capillary refill. No calluses. Normal sensation with monofilament testing   Neurological:     Mental Status: He is alert.      Results for orders placed  or performed in visit on 12/08/22  POC HgB A1c  Result Value Ref Range   Hemoglobin A1C 5.8 (A) 4.0 - 5.6 %   HbA1c POC (<> result, manual entry)     HbA1c, POC (prediabetic range)     HbA1c, POC (controlled diabetic range)        The 10-year ASCVD risk score (Arnett DK, et al., 2019) is: 45%    Assessment & Plan:   #1 type 2 diabetes well-controlled with A1c today 5.8%.  We did discuss option of dropping metformin back to once daily.  Continue yearly diabetic eye exam.  Foot exam today unremarkable  #2 hypertension stable and at goal.  No recent orthostatic symptoms.  Continue amlodipine 5 mg daily losartan 100 mg daily.  #3 hyperlipidemia  treated with atorvastatin 40 mg daily.  Recheck lipid and CMP  #4 history of Parkinson's disease and mild cognitive impairment.  Recommend he continue close follow-up with neurology.  He is getting Namenda and Aricept through their office.  #5 history of B12 deficiency-recheck B12.  He takes oral supplement daily.  Has not had a level checked in a few years.   Return in about 6 months (around 06/08/2023).    Carolann Littler, MD

## 2022-12-08 NOTE — Patient Instructions (Signed)
It was good seeing you today!  Let's plan on 6 month follow up.

## 2022-12-11 ENCOUNTER — Other Ambulatory Visit: Payer: Self-pay | Admitting: Family Medicine

## 2022-12-11 DIAGNOSIS — I10 Essential (primary) hypertension: Secondary | ICD-10-CM

## 2022-12-18 ENCOUNTER — Other Ambulatory Visit: Payer: Self-pay | Admitting: Family Medicine

## 2022-12-18 DIAGNOSIS — E1151 Type 2 diabetes mellitus with diabetic peripheral angiopathy without gangrene: Secondary | ICD-10-CM

## 2023-01-07 ENCOUNTER — Ambulatory Visit: Payer: Medicare HMO | Admitting: Family Medicine

## 2023-01-21 ENCOUNTER — Other Ambulatory Visit: Payer: Self-pay | Admitting: Family Medicine

## 2023-01-21 DIAGNOSIS — I10 Essential (primary) hypertension: Secondary | ICD-10-CM

## 2023-02-04 DIAGNOSIS — N529 Male erectile dysfunction, unspecified: Secondary | ICD-10-CM | POA: Diagnosis not present

## 2023-02-04 DIAGNOSIS — E119 Type 2 diabetes mellitus without complications: Secondary | ICD-10-CM | POA: Diagnosis not present

## 2023-02-04 DIAGNOSIS — Z7984 Long term (current) use of oral hypoglycemic drugs: Secondary | ICD-10-CM | POA: Diagnosis not present

## 2023-02-04 DIAGNOSIS — I10 Essential (primary) hypertension: Secondary | ICD-10-CM | POA: Diagnosis not present

## 2023-02-04 DIAGNOSIS — E785 Hyperlipidemia, unspecified: Secondary | ICD-10-CM | POA: Diagnosis not present

## 2023-02-04 DIAGNOSIS — K219 Gastro-esophageal reflux disease without esophagitis: Secondary | ICD-10-CM | POA: Diagnosis not present

## 2023-02-04 DIAGNOSIS — G20A1 Parkinson's disease without dyskinesia, without mention of fluctuations: Secondary | ICD-10-CM | POA: Diagnosis not present

## 2023-02-04 DIAGNOSIS — F0280 Dementia in other diseases classified elsewhere without behavioral disturbance: Secondary | ICD-10-CM | POA: Diagnosis not present

## 2023-02-04 DIAGNOSIS — Z803 Family history of malignant neoplasm of breast: Secondary | ICD-10-CM | POA: Diagnosis not present

## 2023-02-04 DIAGNOSIS — Z87891 Personal history of nicotine dependence: Secondary | ICD-10-CM | POA: Diagnosis not present

## 2023-02-04 DIAGNOSIS — J309 Allergic rhinitis, unspecified: Secondary | ICD-10-CM | POA: Diagnosis not present

## 2023-02-04 DIAGNOSIS — D6949 Other primary thrombocytopenia: Secondary | ICD-10-CM | POA: Diagnosis not present

## 2023-02-15 NOTE — Progress Notes (Unsigned)
ACUTE VISIT No chief complaint on file.  HPI: Mr.Walter [name] is a 77 y.o. male, who is here today complaining of *** HPI  Review of Systems See other pertinent positives and negatives in HPI.  Current Outpatient Medications on File Prior to Visit  Medication Sig Dispense Refill   amLODipine (NORVASC) 5 MG tablet Take 1 tablet by mouth once daily 90 tablet 0   atorvastatin (LIPITOR) 40 MG tablet Take 1 tablet by mouth once daily 90 tablet 1   Blood Glucose Monitoring Suppl (ONE TOUCH ULTRA 2) w/Device KIT 1 kit by Does not apply route daily. 1 kit 0   cyanocobalamin 2000 MCG tablet Take 1 tablet (2,000 mcg total) by mouth daily. 30 tablet 0   Dihydroxyaluminum Sod Carb (ROLAIDS PO) Take by mouth as needed.     donepezil (ARICEPT) 10 MG tablet Take 1 tablet daily 90 tablet 3   Esomeprazole Magnesium (NEXIUM PO) Take by mouth.     glucose blood (ONETOUCH ULTRA) test strip USE 1 STRIP TO CHECK GLUCOSE ONCE DAILY 100 each 0   Lancets (ONETOUCH ULTRASOFT) lancets Check blood sugars once daily. Dx: E11.51 100 each 12   losartan (COZAAR) 100 MG tablet Take 1 tablet by mouth once daily 90 tablet 0   memantine (NAMENDA) 10 MG tablet Take 1 tablet twice a day 180 tablet 3   metFORMIN (GLUCOPHAGE) 1000 MG tablet TAKE 1 TABLET BY MOUTH TWICE DAILY WITH A MEAL 180 tablet 0   OVER THE COUNTER MEDICATION Allergy relief, one tablet daily.     No current facility-administered medications on file prior to visit.    Past Medical History:  Diagnosis Date   Allergy    Cancer (HCC)    Skin cancer   Cataract    Coronary artery disease    Diabetes mellitus    Diverticulosis    Esophageal stricture    GERD (gastroesophageal reflux disease)    Hyperlipidemia    Hypertension    Peripheral vascular disease (HCC)    No Known Allergies  Social History   Socioeconomic History   Marital status: Married    Spouse name: Not on file   Number of children: 3   Years of education: Not on file    Highest education level: Not on file  Occupational History   Occupation: Auditor  Tobacco Use   Smoking status: Former    Types: Cigarettes    Quit date: 11/02/1999    Years since quitting: 23.3   Smokeless tobacco: Never  Vaping Use   Vaping Use: Never used  Substance and Sexual Activity   Alcohol use: No   Drug use: No   Sexual activity: Not Currently    Partners: Female  Other Topics Concern   Not on file  Social History Narrative   Daily caffeine       Right handed      Lives with wife in a one story home      Social Determinants of Health   Financial Resource Strain: Low Risk  (04/23/2022)   Overall Financial Resource Strain (CARDIA)    Difficulty of Paying Living Expenses: Not hard at all  Food Insecurity: No Food Insecurity (04/23/2022)   Hunger Vital Sign    Worried About Running Out of Food in the Last Year: Never true    Ran Out of Food in the Last Year: Never true  Transportation Needs: No Transportation Needs (04/23/2022)   PRAPARE - Transportation    Lack of Transportation (  Medical): No    Lack of Transportation (Non-Medical): No  Physical Activity: Inactive (04/23/2022)   Exercise Vital Sign    Days of Exercise per Week: 0 days    Minutes of Exercise per Session: 0 min  Stress: No Stress Concern Present (04/23/2022)   [employer] of Occupational Health - Occupational Stress Questionnaire    Feeling of Stress : Not at all  Social Connections: Socially Integrated (04/23/2022)   Social Connection and Isolation Panel [NHANES]    Frequency of Communication with Friends and Family: More than three times a week    Frequency of Social Gatherings with Friends and Family: More than three times a week    Attends Religious Services: More than 4 times per year    Active Member of [employer] or Organizations: Yes    Attends Engineer, structural: More than 4 times per year    Marital Status: Married    There were no vitals filed for this visit. There is no  height or weight on file to calculate BMI.  Physical Exam  ASSESSMENT AND PLAN: There are no diagnoses linked to this encounter.  No follow-ups on file.  Shara Hartis G. [place], MD  [facility]. [place] office.  Discharge Instructions   None

## 2023-02-16 ENCOUNTER — Ambulatory Visit (INDEPENDENT_AMBULATORY_CARE_PROVIDER_SITE_OTHER): Payer: Medicare HMO

## 2023-02-16 ENCOUNTER — Telehealth: Payer: Self-pay

## 2023-02-16 ENCOUNTER — Other Ambulatory Visit: Payer: Self-pay

## 2023-02-16 ENCOUNTER — Encounter: Payer: Self-pay | Admitting: Family Medicine

## 2023-02-16 ENCOUNTER — Ambulatory Visit (INDEPENDENT_AMBULATORY_CARE_PROVIDER_SITE_OTHER): Payer: Medicare HMO | Admitting: Family Medicine

## 2023-02-16 VITALS — BP 128/60 | HR 94 | Resp 16 | Ht 67.0 in | Wt 143.0 lb

## 2023-02-16 DIAGNOSIS — M8588 Other specified disorders of bone density and structure, other site: Secondary | ICD-10-CM | POA: Diagnosis not present

## 2023-02-16 DIAGNOSIS — E1151 Type 2 diabetes mellitus with diabetic peripheral angiopathy without gangrene: Secondary | ICD-10-CM | POA: Diagnosis not present

## 2023-02-16 DIAGNOSIS — K5900 Constipation, unspecified: Secondary | ICD-10-CM

## 2023-02-16 DIAGNOSIS — M545 Low back pain, unspecified: Secondary | ICD-10-CM

## 2023-02-16 DIAGNOSIS — D649 Anemia, unspecified: Secondary | ICD-10-CM

## 2023-02-16 LAB — BASIC METABOLIC PANEL
BUN: 31 mg/dL — ABNORMAL HIGH (ref 6–23)
CO2: 24 mEq/L (ref 19–32)
Calcium: 9.4 mg/dL (ref 8.4–10.5)
Chloride: 103 mEq/L (ref 96–112)
Creatinine, Ser: 1.53 mg/dL — ABNORMAL HIGH (ref 0.40–1.50)
GFR: 43.49 mL/min — ABNORMAL LOW (ref >60.00)
Glucose, Bld: 113 mg/dL — ABNORMAL HIGH (ref 70–99)
Potassium: 4.2 mEq/L (ref 3.5–5.1)
Sodium: 141 mEq/L (ref 135–145)

## 2023-02-16 LAB — CBC
HCT: 24.8 % — ABNORMAL LOW (ref 39.0–52.0)
Hemoglobin: 7.4 g/dL — CL (ref 13.0–17.0)
MCHC: 29.9 g/dL — ABNORMAL LOW (ref 30.0–36.0)
MCV: 75.5 fl — ABNORMAL LOW (ref 78.0–100.0)
Platelets: 270 10*3/uL (ref 150.0–400.0)
RBC: 3.28 Mil/uL — ABNORMAL LOW (ref 4.22–5.81)
RDW: 18.7 % — ABNORMAL HIGH (ref 11.5–15.5)
WBC: 11.2 10*3/uL — ABNORMAL HIGH (ref 4.0–10.5)

## 2023-02-16 LAB — TSH: TSH: 0.65 u[IU]/mL (ref 0.35–5.50)

## 2023-02-16 MED ORDER — IRON (FERROUS SULFATE) 325 (65 FE) MG PO TABS: 325.0000 mg | ORAL_TABLET | Freq: Every day | ORAL | 3 refills | Status: DC

## 2023-02-16 MED ORDER — METFORMIN HCL 1000 MG PO TABS: 1000.0000 mg | ORAL_TABLET | Freq: Two times a day (BID) | ORAL | 0 refills | Status: DC

## 2023-02-16 MED ORDER — TIZANIDINE HCL 4 MG PO TABS: 2.0000 mg | ORAL_TABLET | Freq: Every evening | ORAL | 0 refills | Status: DC | PRN

## 2023-02-16 NOTE — Telephone Encounter (Signed)
CRITICAL VALUE STICKER  CRITICAL VALUE: Hemoglobin 7.5%  RECEIVER (on-site recipient of call): Maralyn Sago, CMA  DATE & TIME NOTIFIED: 02/16/23 4:15  MESSENGER (representative from lab): Lorie  MD NOTIFIED: Yes  TIME OF NOTIFICATION: 4:17pm  RESPONSE: in-basket.

## 2023-02-16 NOTE — Patient Instructions (Addendum)
A few things to remember from today's visit:  Diabetes mellitus with peripheral circulatory disorder - Plan: metFORMIN (GLUCOPHAGE) 1000 MG tablet, Basic metabolic panel  Acute midline low back pain without sciatica - Plan: DG Lumbar Spine Complete, tiZANidine (ZANAFLEX) 4 MG tablet  Constipation, unspecified constipation type - Plan: CBC, Basic metabolic panel, TSH  Miralax daily at bedtime and Bisacodyl 5 mg at bedtime for a few days until a good bowel movement then every 2 days.  Topical icy hot and asper cream on area of pain will also help. Muscle relaxant at bedtime, caution with falls.  Do not use My Chart to request refills or for acute issues that need immediate attention. If you send a my chart message, it may take a few days to be addressed, specially if I am not in the office.  Please be sure medication list is accurate. If a new problem present, please set up appointment sooner than planned today.

## 2023-02-16 NOTE — Telephone Encounter (Signed)
See result note.  

## 2023-02-17 ENCOUNTER — Telehealth: Payer: Self-pay | Admitting: Family Medicine

## 2023-02-17 DIAGNOSIS — D649 Anemia, unspecified: Secondary | ICD-10-CM

## 2023-02-17 NOTE — Telephone Encounter (Signed)
Pt wife Rhunette Croft is calling and would like md to return her call md spoke with pt on 02-16-2023 and wife said her husband could not relate to her want dr [place] said to him also wife would like xray results and it wife is not available on cell please call home number [phone]

## 2023-02-18 NOTE — Telephone Encounter (Signed)
I spoke with patient's wife. We went over the results and she verbalized understanding. She is aware we are still waiting on the x-ray to be read by radiology. She requested the gastroenterology referral be changed to closer to where they live. New referral placed.

## 2023-02-21 ENCOUNTER — Emergency Department (HOSPITAL_COMMUNITY): Payer: Medicare HMO

## 2023-02-21 ENCOUNTER — Inpatient Hospital Stay (HOSPITAL_COMMUNITY)
Admission: EM | Admit: 2023-02-21 | Discharge: 2023-03-10 | DRG: 981 | Disposition: A | Discharge status: Skilled Nursing Facility | Payer: Medicare HMO | Attending: Internal Medicine | Admitting: Internal Medicine

## 2023-02-21 ENCOUNTER — Other Ambulatory Visit: Payer: Self-pay

## 2023-02-21 ENCOUNTER — Telehealth: Payer: Self-pay | Admitting: Family Medicine

## 2023-02-21 ENCOUNTER — Encounter (HOSPITAL_COMMUNITY): Payer: Self-pay

## 2023-02-21 ENCOUNTER — Other Ambulatory Visit: Payer: Self-pay | Admitting: Family Medicine

## 2023-02-21 DIAGNOSIS — I82499 Acute embolism and thrombosis of other specified deep vein of unspecified lower extremity: Secondary | ICD-10-CM | POA: Diagnosis not present

## 2023-02-21 DIAGNOSIS — I7143 Infrarenal abdominal aortic aneurysm, without rupture: Secondary | ICD-10-CM | POA: Diagnosis not present

## 2023-02-21 DIAGNOSIS — R634 Abnormal weight loss: Secondary | ICD-10-CM | POA: Diagnosis not present

## 2023-02-21 DIAGNOSIS — E46 Unspecified protein-calorie malnutrition: Secondary | ICD-10-CM | POA: Diagnosis not present

## 2023-02-21 DIAGNOSIS — S32010A Wedge compression fracture of first lumbar vertebra, initial encounter for closed fracture: Secondary | ICD-10-CM | POA: Insufficient documentation

## 2023-02-21 DIAGNOSIS — R21 Rash and other nonspecific skin eruption: Secondary | ICD-10-CM | POA: Diagnosis not present

## 2023-02-21 DIAGNOSIS — D509 Iron deficiency anemia, unspecified: Secondary | ICD-10-CM | POA: Diagnosis present

## 2023-02-21 DIAGNOSIS — K5730 Diverticulosis of large intestine without perforation or abscess without bleeding: Secondary | ICD-10-CM | POA: Diagnosis not present

## 2023-02-21 DIAGNOSIS — E8809 Other disorders of plasma-protein metabolism, not elsewhere classified: Secondary | ICD-10-CM | POA: Diagnosis present

## 2023-02-21 DIAGNOSIS — Y92009 Unspecified place in unspecified non-institutional (private) residence as the place of occurrence of the external cause: Secondary | ICD-10-CM | POA: Diagnosis not present

## 2023-02-21 DIAGNOSIS — Z86711 Personal history of pulmonary embolism: Secondary | ICD-10-CM | POA: Diagnosis not present

## 2023-02-21 DIAGNOSIS — K6389 Other specified diseases of intestine: Secondary | ICD-10-CM | POA: Diagnosis not present

## 2023-02-21 DIAGNOSIS — M545 Low back pain, unspecified: Secondary | ICD-10-CM | POA: Diagnosis not present

## 2023-02-21 DIAGNOSIS — E1151 Type 2 diabetes mellitus with diabetic peripheral angiopathy without gangrene: Secondary | ICD-10-CM | POA: Diagnosis not present

## 2023-02-21 DIAGNOSIS — D122 Benign neoplasm of ascending colon: Secondary | ICD-10-CM | POA: Diagnosis not present

## 2023-02-21 DIAGNOSIS — R6881 Early satiety: Secondary | ICD-10-CM | POA: Diagnosis not present

## 2023-02-21 DIAGNOSIS — I745 Embolism and thrombosis of iliac artery: Secondary | ICD-10-CM | POA: Diagnosis present

## 2023-02-21 DIAGNOSIS — K5750 Diverticulosis of both small and large intestine without perforation or abscess without bleeding: Secondary | ICD-10-CM | POA: Diagnosis present

## 2023-02-21 DIAGNOSIS — Z85828 Personal history of other malignant neoplasm of skin: Secondary | ICD-10-CM

## 2023-02-21 DIAGNOSIS — B370 Candidal stomatitis: Secondary | ICD-10-CM | POA: Diagnosis not present

## 2023-02-21 DIAGNOSIS — I2510 Atherosclerotic heart disease of native coronary artery without angina pectoris: Secondary | ICD-10-CM | POA: Diagnosis present

## 2023-02-21 DIAGNOSIS — I2699 Other pulmonary embolism without acute cor pulmonale: Secondary | ICD-10-CM | POA: Diagnosis not present

## 2023-02-21 DIAGNOSIS — K219 Gastro-esophageal reflux disease without esophagitis: Secondary | ICD-10-CM | POA: Diagnosis present

## 2023-02-21 DIAGNOSIS — I281 Aneurysm of pulmonary artery: Secondary | ICD-10-CM | POA: Diagnosis not present

## 2023-02-21 DIAGNOSIS — K648 Other hemorrhoids: Secondary | ICD-10-CM | POA: Diagnosis present

## 2023-02-21 DIAGNOSIS — K449 Diaphragmatic hernia without obstruction or gangrene: Secondary | ICD-10-CM | POA: Diagnosis not present

## 2023-02-21 DIAGNOSIS — M4856XA Collapsed vertebra, not elsewhere classified, lumbar region, initial encounter for fracture: Secondary | ICD-10-CM | POA: Diagnosis not present

## 2023-02-21 DIAGNOSIS — Z7984 Long term (current) use of oral hypoglycemic drugs: Secondary | ICD-10-CM

## 2023-02-21 DIAGNOSIS — R7303 Prediabetes: Secondary | ICD-10-CM | POA: Diagnosis not present

## 2023-02-21 DIAGNOSIS — I714 Abdominal aortic aneurysm, without rupture, unspecified: Principal | ICD-10-CM | POA: Diagnosis present

## 2023-02-21 DIAGNOSIS — I2693 Single subsegmental pulmonary embolism without acute cor pulmonale: Secondary | ICD-10-CM | POA: Diagnosis not present

## 2023-02-21 DIAGNOSIS — Z9181 History of falling: Secondary | ICD-10-CM | POA: Diagnosis not present

## 2023-02-21 DIAGNOSIS — I7409 Other arterial embolism and thrombosis of abdominal aorta: Secondary | ICD-10-CM | POA: Diagnosis present

## 2023-02-21 DIAGNOSIS — K649 Unspecified hemorrhoids: Secondary | ICD-10-CM | POA: Diagnosis not present

## 2023-02-21 DIAGNOSIS — K2970 Gastritis, unspecified, without bleeding: Secondary | ICD-10-CM | POA: Diagnosis not present

## 2023-02-21 DIAGNOSIS — E782 Mixed hyperlipidemia: Secondary | ICD-10-CM | POA: Diagnosis not present

## 2023-02-21 DIAGNOSIS — R296 Repeated falls: Secondary | ICD-10-CM

## 2023-02-21 DIAGNOSIS — K31811 Angiodysplasia of stomach and duodenum with bleeding: Secondary | ICD-10-CM | POA: Diagnosis present

## 2023-02-21 DIAGNOSIS — D120 Benign neoplasm of cecum: Secondary | ICD-10-CM | POA: Diagnosis not present

## 2023-02-21 DIAGNOSIS — K5520 Angiodysplasia of colon without hemorrhage: Secondary | ICD-10-CM

## 2023-02-21 DIAGNOSIS — L299 Pruritus, unspecified: Secondary | ICD-10-CM | POA: Diagnosis not present

## 2023-02-21 DIAGNOSIS — G20A1 Parkinson's disease without dyskinesia, without mention of fluctuations: Secondary | ICD-10-CM | POA: Diagnosis present

## 2023-02-21 DIAGNOSIS — I82441 Acute embolism and thrombosis of right tibial vein: Secondary | ICD-10-CM | POA: Diagnosis present

## 2023-02-21 DIAGNOSIS — K5900 Constipation, unspecified: Secondary | ICD-10-CM | POA: Diagnosis not present

## 2023-02-21 DIAGNOSIS — W1830XA Fall on same level, unspecified, initial encounter: Secondary | ICD-10-CM | POA: Diagnosis present

## 2023-02-21 DIAGNOSIS — E119 Type 2 diabetes mellitus without complications: Secondary | ICD-10-CM | POA: Diagnosis not present

## 2023-02-21 DIAGNOSIS — Z955 Presence of coronary angioplasty implant and graft: Secondary | ICD-10-CM

## 2023-02-21 DIAGNOSIS — F02A0 Dementia in other diseases classified elsewhere, mild, without behavioral disturbance, psychotic disturbance, mood disturbance, and anxiety: Secondary | ICD-10-CM | POA: Diagnosis present

## 2023-02-21 DIAGNOSIS — I10 Essential (primary) hypertension: Secondary | ICD-10-CM | POA: Diagnosis present

## 2023-02-21 DIAGNOSIS — I7090 Unspecified atherosclerosis: Secondary | ICD-10-CM | POA: Diagnosis not present

## 2023-02-21 DIAGNOSIS — M4854XA Collapsed vertebra, not elsewhere classified, thoracic region, initial encounter for fracture: Secondary | ICD-10-CM | POA: Diagnosis not present

## 2023-02-21 DIAGNOSIS — E43 Unspecified severe protein-calorie malnutrition: Secondary | ICD-10-CM | POA: Diagnosis not present

## 2023-02-21 DIAGNOSIS — Z6821 Body mass index (BMI) 21.0-21.9, adult: Secondary | ICD-10-CM

## 2023-02-21 DIAGNOSIS — Z794 Long term (current) use of insulin: Secondary | ICD-10-CM | POA: Diagnosis not present

## 2023-02-21 DIAGNOSIS — K635 Polyp of colon: Secondary | ICD-10-CM | POA: Diagnosis not present

## 2023-02-21 DIAGNOSIS — F0390 Unspecified dementia without behavioral disturbance: Secondary | ICD-10-CM | POA: Insufficient documentation

## 2023-02-21 DIAGNOSIS — Z7409 Other reduced mobility: Secondary | ICD-10-CM | POA: Diagnosis present

## 2023-02-21 DIAGNOSIS — S7012XA Contusion of left thigh, initial encounter: Secondary | ICD-10-CM | POA: Diagnosis present

## 2023-02-21 DIAGNOSIS — S7002XA Contusion of left hip, initial encounter: Secondary | ICD-10-CM | POA: Diagnosis present

## 2023-02-21 DIAGNOSIS — Z66 Do not resuscitate: Secondary | ICD-10-CM | POA: Diagnosis not present

## 2023-02-21 DIAGNOSIS — D649 Anemia, unspecified: Secondary | ICD-10-CM | POA: Insufficient documentation

## 2023-02-21 DIAGNOSIS — R112 Nausea with vomiting, unspecified: Secondary | ICD-10-CM | POA: Diagnosis not present

## 2023-02-21 DIAGNOSIS — R630 Anorexia: Secondary | ICD-10-CM | POA: Diagnosis not present

## 2023-02-21 DIAGNOSIS — Z87891 Personal history of nicotine dependence: Secondary | ICD-10-CM | POA: Diagnosis not present

## 2023-02-21 DIAGNOSIS — M898X8 Other specified disorders of bone, other site: Secondary | ICD-10-CM | POA: Diagnosis not present

## 2023-02-21 DIAGNOSIS — Z79899 Other long term (current) drug therapy: Secondary | ICD-10-CM

## 2023-02-21 DIAGNOSIS — Z820 Family history of epilepsy and other diseases of the nervous system: Secondary | ICD-10-CM

## 2023-02-21 DIAGNOSIS — R69 Illness, unspecified: Secondary | ICD-10-CM | POA: Diagnosis not present

## 2023-02-21 DIAGNOSIS — Z136 Encounter for screening for cardiovascular disorders: Secondary | ICD-10-CM | POA: Diagnosis not present

## 2023-02-21 DIAGNOSIS — K921 Melena: Secondary | ICD-10-CM

## 2023-02-21 DIAGNOSIS — I739 Peripheral vascular disease, unspecified: Secondary | ICD-10-CM | POA: Diagnosis not present

## 2023-02-21 DIAGNOSIS — K31819 Angiodysplasia of stomach and duodenum without bleeding: Secondary | ICD-10-CM | POA: Diagnosis not present

## 2023-02-21 LAB — COMPREHENSIVE METABOLIC PANEL
ALT: 18 U/L (ref 0–44)
AST: 22 U/L (ref 15–41)
Albumin: 3.2 g/dL — ABNORMAL LOW (ref 3.5–5.0)
Alkaline Phosphatase: 108 U/L (ref 38–126)
Anion gap: 12 (ref 5–15)
BUN: 27 mg/dL — ABNORMAL HIGH (ref 8–23)
CO2: 23 mmol/L (ref 22–32)
Calcium: 8.7 mg/dL — ABNORMAL LOW (ref 8.9–10.3)
Chloride: 103 mmol/L (ref 98–111)
Creatinine, Ser: 1.38 mg/dL — ABNORMAL HIGH (ref 0.61–1.24)
GFR, Estimated: 53 mL/min — ABNORMAL LOW (ref >60)
Glucose, Bld: 129 mg/dL — ABNORMAL HIGH (ref 70–99)
Potassium: 4.1 mmol/L (ref 3.5–5.1)
Sodium: 138 mmol/L (ref 135–145)
Total Bilirubin: 0.5 mg/dL (ref 0.3–1.2)
Total Protein: 7.4 g/dL (ref 6.5–8.1)

## 2023-02-21 LAB — CBC
HCT: 27.2 % — ABNORMAL LOW (ref 39.0–52.0)
Hemoglobin: 7.6 g/dL — ABNORMAL LOW (ref 13.0–17.0)
MCH: 22.8 pg — ABNORMAL LOW (ref 26.0–34.0)
MCHC: 27.9 g/dL — ABNORMAL LOW (ref 30.0–36.0)
MCV: 81.4 fL (ref 80.0–100.0)
Platelets: 286 10*3/uL (ref 150–400)
RBC: 3.34 MIL/uL — ABNORMAL LOW (ref 4.22–5.81)
RDW: 18.4 % — ABNORMAL HIGH (ref 11.5–15.5)
WBC: 10.5 10*3/uL (ref 4.0–10.5)
nRBC: 0 % (ref 0.0–0.2)

## 2023-02-21 MED ORDER — MEMANTINE HCL 10 MG PO TABS
10.0000 mg | ORAL_TABLET | Freq: Two times a day (BID) | ORAL | Status: DC
  Administered 2023-02-21 – 2023-03-10 (×33): 10 mg via ORAL
  Filled 2023-02-21 (×36): qty 1

## 2023-02-21 MED ORDER — ATORVASTATIN CALCIUM 40 MG PO TABS
40.0000 mg | ORAL_TABLET | Freq: Every day | ORAL | Status: DC
  Administered 2023-02-22 – 2023-03-10 (×16): 40 mg via ORAL
  Filled 2023-02-21 (×17): qty 1

## 2023-02-21 MED ORDER — DONEPEZIL HCL 5 MG PO TABS
10.0000 mg | ORAL_TABLET | Freq: Every day | ORAL | Status: DC
  Administered 2023-02-21 – 2023-03-09 (×17): 10 mg via ORAL
  Filled 2023-02-21 (×3): qty 1
  Filled 2023-02-21: qty 2
  Filled 2023-02-21: qty 1
  Filled 2023-02-21: qty 2
  Filled 2023-02-21 (×11): qty 1

## 2023-02-21 MED ORDER — FERROUS SULFATE 325 (65 FE) MG PO TABS
325.0000 mg | ORAL_TABLET | Freq: Every day | ORAL | Status: DC
  Administered 2023-02-24 – 2023-03-10 (×14): 325 mg via ORAL
  Filled 2023-02-21 (×16): qty 1

## 2023-02-21 MED ORDER — PANTOPRAZOLE SODIUM 40 MG PO TBEC
40.0000 mg | DELAYED_RELEASE_TABLET | Freq: Every day | ORAL | Status: DC
  Administered 2023-02-22 – 2023-03-10 (×16): 40 mg via ORAL
  Filled 2023-02-21 (×17): qty 1

## 2023-02-21 MED ORDER — VITAMIN B-12 1000 MCG PO TABS
2000.0000 ug | ORAL_TABLET | Freq: Every day | ORAL | Status: DC
  Administered 2023-02-22 – 2023-03-10 (×16): 2000 ug via ORAL
  Filled 2023-02-21 (×17): qty 2

## 2023-02-21 MED ORDER — IOHEXOL 350 MG/ML SOLN
80.0000 mL | Freq: Once | INTRAVENOUS | Status: AC | PRN
  Administered 2023-02-21: 80 mL via INTRAVENOUS

## 2023-02-21 MED ORDER — HEPARIN BOLUS VIA INFUSION
3000.0000 [IU] | Freq: Once | INTRAVENOUS | Status: AC
  Administered 2023-02-21: 3000 [IU] via INTRAVENOUS

## 2023-02-21 MED ORDER — ACETAMINOPHEN 650 MG RE SUPP: 650.0000 mg | Freq: Four times a day (QID) | RECTAL | Status: DC | PRN

## 2023-02-21 MED ORDER — ACETAMINOPHEN 325 MG PO TABS
650.0000 mg | ORAL_TABLET | Freq: Four times a day (QID) | ORAL | Status: DC | PRN
  Administered 2023-02-22 – 2023-03-09 (×14): 650 mg via ORAL
  Filled 2023-02-21 (×14): qty 2

## 2023-02-21 MED ORDER — ONDANSETRON HCL 4 MG/2ML IJ SOLN
4.0000 mg | Freq: Four times a day (QID) | INTRAMUSCULAR | Status: DC | PRN
  Administered 2023-02-24 – 2023-02-27 (×2): 4 mg via INTRAVENOUS
  Filled 2023-02-21 (×2): qty 2

## 2023-02-21 MED ORDER — LOSARTAN POTASSIUM 50 MG PO TABS
100.0000 mg | ORAL_TABLET | Freq: Every day | ORAL | Status: DC
  Administered 2023-02-22 – 2023-03-10 (×16): 100 mg via ORAL
  Filled 2023-02-21: qty 4
  Filled 2023-02-21 (×16): qty 2

## 2023-02-21 MED ORDER — HEPARIN (PORCINE) 25000 UT/250ML-% IV SOLN
850.0000 [IU]/h | INTRAVENOUS | Status: DC
  Administered 2023-02-21: 1000 [IU]/h via INTRAVENOUS
  Administered 2023-02-23: 850 [IU]/h via INTRAVENOUS
  Filled 2023-02-21 (×2): qty 250

## 2023-02-21 MED ORDER — AMLODIPINE BESYLATE 5 MG PO TABS
5.0000 mg | ORAL_TABLET | Freq: Every day | ORAL | Status: DC
  Administered 2023-02-22 – 2023-03-10 (×16): 5 mg via ORAL
  Filled 2023-02-21 (×17): qty 1

## 2023-02-21 MED ORDER — ONDANSETRON HCL 4 MG PO TABS
4.0000 mg | ORAL_TABLET | Freq: Four times a day (QID) | ORAL | Status: DC | PRN
  Filled 2023-02-21: qty 1

## 2023-02-21 MED ORDER — GLUCERNA SHAKE PO LIQD
237.0000 mL | Freq: Three times a day (TID) | ORAL | Status: DC
  Administered 2023-02-21 – 2023-02-23 (×3): 237 mL via ORAL
  Filled 2023-02-21 (×9): qty 237

## 2023-02-21 NOTE — Telephone Encounter (Signed)
Requesting call to discuss imaging results

## 2023-02-21 NOTE — Addendum Note (Signed)
Addended by: Kathreen Devoid on: 02/21/2023 09:19 AM   Modules accepted: Orders

## 2023-02-21 NOTE — ED Provider Notes (Signed)
[place] EMERGENCY DEPARTMENT AT [facility] Provider Note   CSN: [id number] Arrival date & time: 02/21/23  1019     History  Chief Complaint  Patient presents with   Back Pain    Walter [name] is a 77 y.o. male past medical history of diabetes, hypertension recommended to go to the emergency department by his PCP for evaluation of abdominal aneurysm.  Patient had an incidental finding of abdominal aortic aneurysm on x-ray of his lumbar spine after having a fall and has pain in his tailbone.  Per family patient has had a couple falls in the last 2 weeks.  Denies any head injury or LOC.  Patient had an x-ray at the PCP office and told him to come to the ER for an ultrasound because x-rays show an, no aortic aneurysm.  Patient also reported decreased appetite and weakness over several months.  Denies headache, dizziness, lightheadedness, nausea, vomiting, chest pain, shortness of breath.   Back Pain     Past Medical History:  Diagnosis Date   Allergy    Cancer    Skin cancer   Cataract    Coronary artery disease    Diabetes mellitus    Diverticulosis    Esophageal stricture    GERD (gastroesophageal reflux disease)    Hyperlipidemia    Hypertension    Peripheral vascular disease    Past Surgical History:  Procedure Laterality Date   CORONARY ANGIOPLASTY WITH STENT PLACEMENT     ESOPHAGOGASTRODUODENOSCOPY  06/07/2012   Procedure: ESOPHAGOGASTRODUODENOSCOPY (EGD);  Surgeon: Hart Carwin, MD;  Location: [facility];  Service: Endoscopy;  Laterality: N/A;   SHOULDER SURGERY  2006     Home Medications Prior to Admission medications   Medication Sig Start Date End Date Taking? Authorizing Provider  amLODipine (NORVASC) 5 MG tablet Take 1 tablet by mouth once daily Patient taking differently: Take 5 mg by mouth daily. 01/24/23  Yes Burchette, Elberta Fortis, MD  atorvastatin (LIPITOR) 40 MG tablet Take 1 tablet by mouth once daily Patient taking differently: Take  40 mg by mouth daily. 09/20/22  Yes Burchette, Elberta Fortis, MD  cyanocobalamin 2000 MCG tablet Take 1 tablet (2,000 mcg total) by mouth daily. 04/24/19  Yes Waldon Merl, PA-C  Dihydroxyaluminum Sod Carb (ROLAIDS PO) Take 1 tablet by mouth as needed (heartburn).   Yes [provider]  donepezil (ARICEPT) 10 MG tablet Take 1 tablet daily Patient taking differently: Take 10 mg by mouth daily. 10/29/22  Yes Van Clines, MD  Esomeprazole Magnesium (NEXIUM PO) Take 1 tablet by mouth daily as needed (acid reflux).   Yes [provider]  Iron, Ferrous Sulfate, 325 (65 Fe) MG TABS Take 325 mg by mouth daily. 02/16/23  Yes [place], Betty G, MD  losartan (COZAAR) 100 MG tablet Take 1 tablet by mouth once daily 12/13/22  Yes Burchette, Elberta Fortis, MD  memantine (NAMENDA) 10 MG tablet Take 1 tablet twice a day Patient taking differently: Take 10 mg by mouth 2 (two) times daily. 10/29/22  Yes Van Clines, MD  metFORMIN (GLUCOPHAGE) 1000 MG tablet Take 1 tablet (1,000 mg total) by mouth 2 (two) times daily with a meal. 02/16/23  Yes [place], Betty G, MD  OVER THE COUNTER MEDICATION Take 1 tablet by mouth daily. Allergy relief   Yes [provider]  tiZANidine (ZANAFLEX) 4 MG tablet Take 0.5-1 tablets (2-4 mg total) by mouth at bedtime as needed for muscle spasms. 02/16/23 03/18/23 Yes [place],  Timoteo Expose, MD  Blood Glucose Monitoring Suppl (ONE TOUCH ULTRA 2) w/Device KIT 1 kit by Does not apply route daily. 07/21/20   Waldon Merl, PA-C  glucose blood (ONETOUCH ULTRA) test strip USE 1 STRIP TO CHECK GLUCOSE ONCE DAILY 12/20/22   Burchette, Elberta Fortis, MD  Lancets [facility] ULTRASOFT) lancets Check blood sugars once daily. Dx: E11.51 07/21/20   Waldon Merl, PA-C      Allergies    Patient has no known allergies.    Review of Systems   Review of Systems  Musculoskeletal:  Positive for back pain.    Physical Exam Updated Vital Signs BP (!) 160/76 (BP Location: Right Arm)   Pulse  70   Temp (!) 97.5 F (36.4 C) (Oral)   Resp 18   Ht 5\' 7"  (1.702 m)   Wt 64.9 kg   SpO2 99%   BMI 22.40 kg/m  Physical Exam Vitals and nursing note reviewed.  Constitutional:      Appearance: Normal appearance.  HENT:     Head: Normocephalic and atraumatic.     Mouth/Throat:     Mouth: Mucous membranes are moist.  Eyes:     General: No scleral icterus. Cardiovascular:     Rate and Rhythm: Normal rate and regular rhythm.     Pulses: Normal pulses.     Heart sounds: Normal heart sounds.  Pulmonary:     Effort: Pulmonary effort is normal.     Breath sounds: Normal breath sounds.  Abdominal:     General: Abdomen is flat.     Palpations: Abdomen is soft.     Tenderness: There is no abdominal tenderness.  Musculoskeletal:        General: No deformity.     Comments: Tenderness to palpation to paraspinal muscles of the lumbar spine.  Skin:    General: Skin is warm.     Findings: No rash.  Neurological:     General: No focal deficit present.     Mental Status: He is alert.  Psychiatric:        Mood and Affect: Mood normal.     ED Results / Procedures / Treatments   Labs (all labs ordered are listed, but only abnormal results are displayed) Labs Reviewed  CBC - Abnormal; Notable for the following components:      Result Value   RBC 3.34 (*)    Hemoglobin 7.6 (*)    HCT 27.2 (*)    MCH 22.8 (*)    MCHC 27.9 (*)    RDW 18.4 (*)    All other components within normal limits  COMPREHENSIVE METABOLIC PANEL - Abnormal; Notable for the following components:   Glucose, Bld 129 (*)    BUN 27 (*)    Creatinine, Ser 1.38 (*)    Calcium 8.7 (*)    Albumin 3.2 (*)    GFR, Estimated 53 (*)    All other components within normal limits  CBC - Abnormal; Notable for the following components:   RBC 3.03 (*)    Hemoglobin 6.9 (*)    HCT 24.3 (*)    MCH 22.8 (*)    MCHC 28.4 (*)    RDW 18.3 (*)    All other components within normal limits  COMPREHENSIVE METABOLIC PANEL -  Abnormal; Notable for the following components:   Glucose, Bld 121 (*)    BUN 24 (*)    Calcium 8.7 (*)    Albumin 3.0 (*)    Total Bilirubin <  0.1 (*)    All other components within normal limits  HEMOGLOBIN AND HEMATOCRIT, BLOOD - Abnormal; Notable for the following components:   Hemoglobin 6.7 (*)    HCT 24.0 (*)    All other components within normal limits  HEMOGLOBIN AND HEMATOCRIT, BLOOD - Abnormal; Notable for the following components:   Hemoglobin 8.6 (*)    HCT 29.2 (*)    All other components within normal limits  GLUCOSE, CAPILLARY - Abnormal; Notable for the following components:   Glucose-Capillary 184 (*)    All other components within normal limits  CBG MONITORING, ED - Abnormal; Notable for the following components:   Glucose-Capillary 100 (*)    All other components within normal limits  CBG MONITORING, ED - Abnormal; Notable for the following components:   Glucose-Capillary 112 (*)    All other components within normal limits  CBG MONITORING, ED - Abnormal; Notable for the following components:   Glucose-Capillary 285 (*)    All other components within normal limits  HEPARIN LEVEL (UNFRACTIONATED)  MAGNESIUM  PHOSPHORUS  HEPARIN LEVEL (UNFRACTIONATED)  BASIC METABOLIC PANEL  MAGNESIUM  HEPARIN LEVEL (UNFRACTIONATED)  CBC  TYPE AND SCREEN  PREPARE RBC (CROSSMATCH)  ABO/RH    EKG None  Radiology CT Angio Abd/Pel W and/or Wo Contrast  Addendum Date: 02/21/2023   ADDENDUM REPORT: 02/21/2023 17:45 ADDENDUM: Within the images through the lower chest, there is a small subsegmental incidental pulmonary embolus noted in a right lower lobe anterolateral pulmonary arterial branch. Clinical significance is questionable due to its small size. These results were called by telephone at the time of interpretation on 02/21/2023 at 5:42 pm to provider Dr. Estell Harpin, who verbally acknowledged these results. Electronically Signed   By: Charlett Nose M.D.   On: 02/21/2023 17:45    Result Date: 02/21/2023 CLINICAL DATA:  back pain, aortic aneurysm, lumbar spine compression fracture. Fall. AAA seen on ultrasound. EXAM: CTA ABDOMEN AND PELVIS WITHOUT AND WITH CONTRAST TECHNIQUE: Multidetector CT imaging of the abdomen and pelvis was performed using the standard protocol during bolus administration of intravenous contrast. Multiplanar reconstructed images and MIPs were obtained and reviewed to evaluate the vascular anatomy. RADIATION DOSE REDUCTION: This exam was performed according to the departmental dose-optimization program which includes automated exposure control, adjustment of the mA and/or kV according to patient size and/or use of iterative reconstruction technique. CONTRAST:  80mL OMNIPAQUE IOHEXOL 350 MG/ML SOLN COMPARISON:  Ultrasound performed today. FINDINGS: VASCULAR Aorta: Heavily calcified aorta. Large infrarenal abdominal aortic aneurysm measuring up to 5.9 cm in maximum diameter. Extensive mural plaque. Aneurysm terminates at the aortic bifurcation. No dissection. Celiac: Heavily calcified. No aneurysm or dissection. No significant stenosis. SMA: Moderate calcifications. No aneurysm or dissection or stenosis. Renals: Moderate calcifications. No aneurysm, dissection or stenosis. IMA: Patent.  No aneurysm, dissection or stenosis. Inflow: Heavily calcified. No aneurysm or dissection. 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 Proximal Outflow: Right common femoral artery occluded. There may be some reconstitution of the proximal superficial femoral and deep femoral artery. Heavily calcified common femoral arteries bilaterally. Left common femoral, proximal superficial femoral and deep femoral arteries are patent. Veins: No obvious venous abnormality within the limitations of this arterial phase study. Review of the MIP images confirms the above findings. NON-VASCULAR Lower chest: Linear areas of scarring.  No acute findings. Hepatobiliary: No focal hepatic  abnormality. Gallbladder unremarkable. Pancreas: No focal abnormality or ductal dilatation. Spleen: No focal abnormality.  Normal size. Adrenals/Urinary Tract: 1.9 cm cyst in the  midpole of the right kidney appears benign. No follow-up imaging recommended. No hydronephrosis. Adrenal glands and urinary bladder unremarkable. Stomach/Bowel: Diffuse colonic diverticulosis. No active diverticulitis. Stomach and small bowel decompressed, unremarkable. Lymphatic: No adenopathy Reproductive: No visible focal abnormality. Other: No free fluid or free air. Small bilateral inguinal hernias containing fat. Musculoskeletal: Moderate L1 compression fracture as seen on plain films. IMPRESSION: VASCULAR 5.9 cm infrarenal abdominal aortic aneurysm with extensive mural plaque. Aneurysm terminates at the aortic bifurcation. 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 There appears to be reconstitution of the right superficial femoral and deep femoral arteries proximally. Diffuse aortoiliac and branch vessel atherosclerosis. NON-VASCULAR Colonic diverticulosis. No acute findings Age-indeterminate moderate L1 compression fracture Electronically Signed: By: Charlett Nose M.D. On: 02/21/2023 17:09   US Aorta  Result Date: 02/21/2023 CLINICAL DATA:  Abdominal aortic aneurysm noted on radiograph. EXAM: ULTRASOUND OF ABDOMINAL AORTA TECHNIQUE: Ultrasound examination of the abdominal aorta and proximal common iliac arteries was performed to evaluate for aneurysm. Additional color and Doppler images of the distal aorta were obtained to document patency. COMPARISON:  February 16, 2023. FINDINGS: Abdominal aortic measurements as follows: Proximal:  1.9 cm Mid:  1.8 cm Distal:  6.0 cm Patent: Yes, peak systolic velocity is 71 cm/s Right common iliac artery: 2.1 cm Left common iliac artery: 2.1 cm IMPRESSION: 6 cm infrarenal abdominal aortic aneurysm is noted with a large amount of mural thrombus. Dilatation of  common iliac arteries is noted bilaterally. Recommend referral to a vascular specialist. This recommendation follows ACR consensus guidelines: White Paper of the ACR Incidental Findings Committee II on Vascular Findings. J Am Coll Radiol 2013; 10:789-794. Electronically Signed   By: Lupita Raider M.D.   On: 02/21/2023 13:22    Procedures .Critical Care  Performed by: Jeanelle Malling, PA Authorized by: Jeanelle Malling, PA   Critical care provider statement:    Critical care time (minutes):  30   Critical care was necessary to treat or prevent imminent or life-threatening deterioration of the following conditions: PE, abdominal aortic aneyrysm, anemia.   Critical care was time spent personally by me on the following activities:  Development of treatment plan with patient or surrogate, discussions with consultants, evaluation of patient's response to treatment, examination of patient, ordering and review of laboratory studies, ordering and review of radiographic studies, ordering and performing treatments and interventions, pulse oximetry, re-evaluation of patient's condition and review of old charts   Care discussed with: admitting provider and accepting provider at another facility       Medications Ordered in ED Medications  heparin bolus via infusion 3,000 Units (3,000 Units Intravenous Bolus from Bag 02/21/23 1931)    Followed by  heparin ADULT infusion 100 units/mL (25000 units/264mL) (900 Units/hr Intravenous Rate/Dose Change 02/22/23 1602)  acetaminophen (TYLENOL) tablet 650 mg (650 mg Oral Given 02/22/23 1030)    Or  acetaminophen (TYLENOL) suppository 650 mg ( Rectal See Alternative 02/22/23 1030)  ondansetron (ZOFRAN) tablet 4 mg (has no administration in time range)    Or  ondansetron (ZOFRAN) injection 4 mg (has no administration in time range)  feeding supplement (GLUCERNA SHAKE) (GLUCERNA SHAKE) liquid 237 mL (237 mLs Oral Not Given 02/22/23 1417)  amLODipine (NORVASC) tablet 5 mg (5 mg Oral  Given 02/22/23 1032)  atorvastatin (LIPITOR) tablet 40 mg (40 mg Oral Given 02/22/23 1032)  losartan (COZAAR) tablet 100 mg (100 mg Oral Given 02/22/23 1031)  donepezil (ARICEPT) tablet 10 mg (10 mg Oral Given  02/21/23 2303)  memantine (NAMENDA) tablet 10 mg (10 mg Oral Given 02/22/23 1033)  pantoprazole (PROTONIX) EC tablet 40 mg (40 mg Oral Given 02/22/23 1032)  cyanocobalamin (VITAMIN B12) tablet 2,000 mcg (2,000 mcg Oral Given 02/22/23 1031)  ferrous sulfate tablet 325 mg (325 mg Oral Not Given 02/22/23 0839)  0.9 %  sodium chloride infusion (Manually program via Guardrails IV Fluids) ( Intravenous Not Given 02/22/23 0652)  iohexol (OMNIPAQUE) 350 MG/ML injection 80 mL (80 mLs Intravenous Contrast Given 02/21/23 1625)  0.9 %  sodium chloride infusion (Manually program via Guardrails IV Fluids) ( Intravenous New Bag/Given 02/22/23 1050)    ED Course/ Medical Decision Making/ A&P                             Medical Decision Making Amount and/or Complexity of Data Reviewed Labs: ordered. Radiology: ordered.  Risk Prescription drug management. Decision regarding hospitalization.   This patient presents to the ED for lower back pain, this involves an extensive number of treatment options, and is a complaint that carries with a high risk of complications and morbidity.  The differential diagnosis includes appendicitis, gastritis, diverticulitis, UTI, renal stone, obstructing stone, testicular torsion, aortic aneurysm.  This is not an exhaustive list.  Lab tests: I ordered and personally interpreted labs.  The pertinent results include: WBC unremarkable. Hbg 7.6. Platelets unremarkable. Electrolytes unremarkable. BUN 24, creatinine unremarkable.   Imaging studies: I ordered imaging studies. I personally reviewed, interpreted imaging and agree with the radiologist's interpretations. The results include: Ultrasound of aorta and CT angio abdomen pelvis showed 5.9 cm infrarenal abdominal aortic  aneurysm with extensive mural plaque. Aneurysm terminates at the aortic bifurcation. 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 There appears to be reconstitution of the right superficial femoral and deep femoral arteries proximally. Diffuse aortoiliac and branch vessel atherosclerosis.  Small subsegmental incidental pulmonary embolus in the right lower lobe  Problem list/ ED course/ Critical interventions/ Medical management: HPI: See above Vital signs within normal range and stable throughout visit. Laboratory/imaging studies significant for: See above. On physical examination, patient is afebrile and appears in no acute distress.  CT angio abdomen pelvis showed a 5.9 cm infrarenal abdominal aortic aneurysm with extensive mural plate.   I spoke to Dr. Randie Heinz from vascular surgery who thinks this is asymptomatic abdominal aneurysm, the back pain is more likely secondary to the compression fracture after a couple falls in the last 2 weeks though the abdominal aortic aneurysm can be contributory.  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  Patient has no contraindication for anticoagulation so heparin drip ordered.  Dr. Randie Heinz notified about the heparin drip through direct phone call and is okay with the plan.  Patient will require admission for further evaluation and management.  Vascular surgery will follow-up once patient is admitted to the hospital. I have reviewed the patient home medicines and have made adjustments as needed.  Cardiac monitoring/EKG: The patient was maintained on a cardiac monitor.  I personally reviewed and interpreted the cardiac monitor which showed an underlying rhythm of: sinus rhythm.  Additional history obtained: External records from outside source obtained and reviewed including: Chart review including previous notes, labs, imaging.  Consultations obtained: I requested consultation with Dr. Randie Heinz of  vascular surgery, and discussed lab and imaging findings as well as pertinent plan.  He recommended CT angio abdomen pelvis. I requested  consultation with Dr. Thomes Dinning [facility] hospitalist, and discussed lab and imaging findings as well as pertinent plan.  He recommended CT angio abdomen pelvis.  Disposition Admit. This chart was dictated using voice recognition software.  Despite best efforts to proofread,  errors can occur which can change the documentation meaning.          Final Clinical Impression(s) / ED Diagnoses Final diagnoses:  Infrarenal abdominal aortic aneurysm (AAA) without rupture  Single subsegmental pulmonary embolism without acute cor pulmonale    Rx / DC Orders ED Discharge Orders     None         Jeanelle Malling, [place] 02/22/23 1931    Gloris Manchester, MD 02/26/23 [phone]

## 2023-02-21 NOTE — Progress Notes (Signed)
ANTICOAGULATION CONSULT NOTE - Initial Consult  Pharmacy Consult for Heparin Indication:  mural thrombus  No Known Allergies  Patient Measurements: Height:  (170.2 cm) Weight: 64.9 kg (143 lb) IBW/kg (Calculated) : 66.1 Heparin Dosing Weight: HEPARIN DW (KG): 64.9   Vital Signs: Temp: 98.4 F (36.9 C) (04/22 1158) Temp Source: Temporal (04/22 1158) BP: 148/63 (04/22 1600) Pulse Rate: 81 (04/22 1600)  Labs: Recent Labs    02/21/23 1256  HGB 7.6*  HCT 27.2*  PLT 286  CREATININE 1.38*    Estimated Creatinine Clearance: 41.2 mL/min (A) (by C-G formula based on SCr of 1.38 mg/dL (H)).   Medical History: Past Medical History:  Diagnosis Date   Allergy    Cancer    Skin cancer   Cataract    Coronary artery disease    Diabetes mellitus    Diverticulosis    Esophageal stricture    GERD (gastroesophageal reflux disease)    Hyperlipidemia    Hypertension    Peripheral vascular disease     Medications:  (Not in a hospital admission)   Medication History pending.  No anticoagulation PTA.  Assessment: 77 yo M with ongoing pain s/p fall ~ 2 weeks.  Had additional imaging 4/22 which showed aneurysm for which pt was sent to the ED for further imaging.  [place] and CT showed 6 cm infrarenal abdominal aortic aneurysm is noted with a large amount of mural thrombus.  Pharmacy has been consulted to dose heparin.  Noted anemia with Hgb 7.6.  Unclear baseline as last Hgb on record from 2020 was 12.  Goal of Therapy:  Heparin level 0.3-0.7 units/ml Monitor platelets by anticoagulation protocol: Yes   Plan:  Heparin 3000 unit IV bolus x 1 Heparin infusion at 1000 units/hr Heparin level in 8 hours Heparin level and CBC daily while on heparin   [employer], Pharm.D., BCPS Clinical Pharmacist  **Pharmacist phone directory can be found on [email or web address] listed under [facility].  02/21/2023 7:03 PM

## 2023-02-21 NOTE — ED Triage Notes (Signed)
Pt complains of "tailbone" pain x 2 weeks after fall from standing while family was trying to help him pull up his pants.  Family reports he had an xray at the PCP office and they told him to come to the ER for an [place] because the xray showed an aneurysm.  Pt also reports he has not been able to eat anything other then potato chips without getting sick for several months.

## 2023-02-21 NOTE — H&P (Addendum)
History and Physical    Patient: Walter [name] ZOX:[id number] DOB: [birth year] DOA: 02/21/2023 DOS: the patient was seen and examined on 02/21/2023 PCP: Kristian Covey, MD  Patient coming from: Home  Chief Complaint:  Chief Complaint  Patient presents with   Back Pain   HPI: Walter [name] is a 77 y.o. male with medical history significant of hypertension, hyperlipidemia, T2DM, GERD, dementia who presents to the emergency department for evaluation of abnormal aortic aneurysm.  Patient has a 2-week history of pain in the tailbone which occurred after sustaining a fall from standing position while family was trying to help him pull up his pants.  He followed up with his PCP, x-ray ordered showed age-indeterminate L1 compression fracture, degenerative disc disease, osteopenia, distal abdominal aortic aneurysm.  Patient was asked to go to the ED for an ultrasound due to the aneurysm findings.  He also complained of not being able to tolerate most foods other than potato chips without getting sick for several months.  He denies fever, chills, chest pain, shortness of breath, nausea or vomiting.  ED Course:  In the emergency department, BP was 140/63, other vital signs were within normal range.  Workup in the ED showed WBC 10.5, hemoglobin 7.6, hematocrit 37.2, MCV 81.4, platelets 286.  BMP was normal except for blood glucose of 129, BUN 27, creatinine 1.38, albumin 3.2. CT abdomen and pelvis without and with contrast showed: 5.9 cm infrarenal abdominal aortic aneurysm with extensive mural plaque. Aneurysm terminates at the aortic bifurcation.  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 There appears to be reconstitution of the right superficial femoral and deep femoral arteries proximally.  Diffuse aortoiliac and branch vessel atherosclerosis. ADDENDUM REPORT  ADDENDUM: Within the images through the lower chest, there is a small subsegmental incidental  pulmonary embolus noted in a right lower lobe anterolateral pulmonary arterial branch.  Ultrasound of abdominal aorta showed 6 cm infrarenal abdominal aortic aneurysm is noted with a large amount of mural thrombus. Dilatation of common iliac arteries is noted bilaterally.  Vascular surgeon (Dr. Randie Heinz) was consulted and recommended admitting patient to [facility] with plan to consult on patient on arrival to [facility]. Patient was started on IV heparin drip due to subsegmental PE.  Hospitalist was asked to admit patient for further evaluation and management.    Review of Systems: Review of systems as noted in the HPI. All other systems reviewed and are negative.   Past Medical History:  Diagnosis Date   Allergy    Cancer    Skin cancer   Cataract    Coronary artery disease    Diabetes mellitus    Diverticulosis    Esophageal stricture    GERD (gastroesophageal reflux disease)    Hyperlipidemia    Hypertension    Peripheral vascular disease    Past Surgical History:  Procedure Laterality Date   CORONARY ANGIOPLASTY WITH STENT PLACEMENT     ESOPHAGOGASTRODUODENOSCOPY  06/07/2012   Procedure: ESOPHAGOGASTRODUODENOSCOPY (EGD);  Surgeon: Hart Carwin, MD;  Location: [facility];  Service: Endoscopy;  Laterality: N/A;   SHOULDER SURGERY  2006    Social History:  reports that he quit smoking about 23 years ago. His smoking use included cigarettes. He has never used smokeless tobacco. He reports that he does not drink alcohol and does not use drugs.   No Known Allergies  Family History  Problem Relation Age of Onset   Breast cancer Mother    Healthy  Son    Healthy Son    Parkinson's disease Sister    Colon cancer Neg Hx    Esophageal cancer Neg Hx    Rectal cancer Neg Hx    Stomach cancer Neg Hx      Prior to Admission medications   Medication Sig Start Date End Date Taking? Authorizing Provider  Iron, Ferrous Sulfate, 325 (65 Fe) MG TABS Take 325 mg by mouth daily. 02/16/23    [place], Betty G, MD  amLODipine (NORVASC) 5 MG tablet Take 1 tablet by mouth once daily 01/24/23   Burchette, Elberta Fortis, MD  atorvastatin (LIPITOR) 40 MG tablet Take 1 tablet by mouth once daily 09/20/22   Burchette, Elberta Fortis, MD  Blood Glucose Monitoring Suppl (ONE TOUCH ULTRA 2) w/Device KIT 1 kit by Does not apply route daily. 07/21/20   Waldon Merl, PA-C  cyanocobalamin 2000 MCG tablet Take 1 tablet (2,000 mcg total) by mouth daily. 04/24/19   Waldon Merl, PA-C  Dihydroxyaluminum Sod Carb (ROLAIDS PO) Take by mouth as needed.    [provider]  donepezil (ARICEPT) 10 MG tablet Take 1 tablet daily 10/29/22   Van Clines, MD  Esomeprazole Magnesium (NEXIUM PO) Take by mouth.    [provider]  glucose blood (ONETOUCH ULTRA) test strip USE 1 STRIP TO CHECK GLUCOSE ONCE DAILY 12/20/22   Burchette, Elberta Fortis, MD  Lancets [facility] ULTRASOFT) lancets Check blood sugars once daily. Dx: E11.51 07/21/20   Waldon Merl, PA-C  losartan (COZAAR) 100 MG tablet Take 1 tablet by mouth once daily 12/13/22   Kristian Covey, MD  memantine [facility]) 10 MG tablet Take 1 tablet twice a day 10/29/22   Van Clines, MD  metFORMIN (GLUCOPHAGE) 1000 MG tablet Take 1 tablet (1,000 mg total) by mouth 2 (two) times daily with a meal. 02/16/23   [place], Betty G, MD  OVER THE COUNTER MEDICATION Allergy relief, one tablet daily.    [provider]  tiZANidine (ZANAFLEX) 4 MG tablet Take 0.5-1 tablets (2-4 mg total) by mouth at bedtime as needed for muscle spasms. 02/16/23 03/18/23  [place], Betty G, MD    Physical Exam: BP (!) 169/69   Pulse 71   Temp 98.4 F (36.9 C) (Temporal)   Resp 16   Ht 5\' 7"  (1.702 m)   Wt 64.9 kg   SpO2 97%   BMI 22.40 kg/m   General: 77 y.o. year-old male well developed well nourished in no acute distress.  Alert and oriented x3. HEENT: NCAT, EOMI Neck: Supple, trachea medial Cardiovascular: Regular rate and rhythm with no rubs or gallops.  No  thyromegaly or JVD noted.  No lower extremity edema. 2/4 pulses in all 4 extremities. Respiratory: Clear to auscultation with no wheezes or rales. Good inspiratory effort. Abdomen: Soft, nontender nondistended with normal bowel sounds x4 quadrants. Muskuloskeletal: No cyanosis, clubbing or edema noted bilaterally Neuro: CN II-XII intact, strength 5/5 x 4, sensation, reflexes intact Skin: No ulcerative lesions noted or rashes Psychiatry: Judgement and insight appear normal. Mood is appropriate for condition and setting          Labs on Admission:  Basic Metabolic Panel: Recent Labs  Lab 02/16/23 1059 02/21/23 1256  NA 141 138  K 4.2 4.1  CL 103 103  CO2 24 23  GLUCOSE 113* 129*  BUN 31* 27*  CREATININE 1.53* 1.38*  CALCIUM 9.4 8.7*   Liver Function Tests: Recent Labs  Lab 02/21/23 1256  AST  22  ALT 18  ALKPHOS 108  BILITOT 0.5  PROT 7.4  ALBUMIN 3.2*   No results for input(s): "LIPASE", "AMYLASE" in the last 168 hours. No results for input(s): "AMMONIA" in the last 168 hours. CBC: Recent Labs  Lab 02/16/23 1059 02/21/23 1256  WBC 11.2* 10.5  HGB 7.4 Repeated and verified X2.* 7.6*  HCT 24.8 Repeated and verified X2.* 27.2*  MCV 75.5* 81.4  PLT 270.0 286   Cardiac Enzymes: No results for input(s): "CKTOTAL", "CKMB", "CKMBINDEX", "TROPONINI" in the last 168 hours.  BNP (last 3 results) No results for input(s): "BNP" in the last 8760 hours.  ProBNP (last 3 results) No results for input(s): "PROBNP" in the last 8760 hours.  CBG: No results for input(s): "GLUCAP" in the last 168 hours.  Radiological Exams on Admission: CT Angio Abd/Pel W and/or Wo Contrast  Addendum Date: 02/21/2023   ADDENDUM REPORT: 02/21/2023 17:45 ADDENDUM: Within the images through the lower chest, there is a small subsegmental incidental pulmonary embolus noted in a right lower lobe anterolateral pulmonary arterial branch. Clinical significance is questionable due to its small size.  These results were called by telephone at the time of interpretation on 02/21/2023 at 5:42 pm to provider Dr. Estell Harpin, who verbally acknowledged these results. Electronically Signed   By: Charlett Nose M.D.   On: 02/21/2023 17:45   Result Date: 02/21/2023 CLINICAL DATA:  back pain, aortic aneurysm, lumbar spine compression fracture. Fall. AAA seen on ultrasound. EXAM: CTA ABDOMEN AND PELVIS WITHOUT AND WITH CONTRAST TECHNIQUE: Multidetector CT imaging of the abdomen and pelvis was performed using the standard protocol during bolus administration of intravenous contrast. Multiplanar reconstructed images and MIPs were obtained and reviewed to evaluate the vascular anatomy. RADIATION DOSE REDUCTION: This exam was performed according to the departmental dose-optimization program which includes automated exposure control, adjustment of the mA and/or kV according to patient size and/or use of iterative reconstruction technique. CONTRAST:  80mL OMNIPAQUE IOHEXOL 350 MG/ML SOLN COMPARISON:  Ultrasound performed today. FINDINGS: VASCULAR Aorta: Heavily calcified aorta. Large infrarenal abdominal aortic aneurysm measuring up to 5.9 cm in maximum diameter. Extensive mural plaque. Aneurysm terminates at the aortic bifurcation. No dissection. Celiac: Heavily calcified. No aneurysm or dissection. No significant stenosis. SMA: Moderate calcifications. No aneurysm or dissection or stenosis. Renals: Moderate calcifications. No aneurysm, dissection or stenosis. IMA: Patent.  No aneurysm, dissection or stenosis. Inflow: Heavily calcified. No aneurysm or dissection. 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 Proximal Outflow: Right common femoral artery occluded. There may be some reconstitution of the proximal superficial femoral and deep femoral artery. Heavily calcified common femoral arteries bilaterally. Left common femoral, proximal superficial femoral and deep femoral arteries are patent. Veins: No obvious venous  abnormality within the limitations of this arterial phase study. Review of the MIP images confirms the above findings. NON-VASCULAR Lower chest: Linear areas of scarring.  No acute findings. Hepatobiliary: No focal hepatic abnormality. Gallbladder unremarkable. Pancreas: No focal abnormality or ductal dilatation. Spleen: No focal abnormality.  Normal size. Adrenals/Urinary Tract: 1.9 cm cyst in the midpole of the right kidney appears benign. No follow-up imaging recommended. No hydronephrosis. Adrenal glands and urinary bladder unremarkable. Stomach/Bowel: Diffuse colonic diverticulosis. No active diverticulitis. Stomach and small bowel decompressed, unremarkable. Lymphatic: No adenopathy Reproductive: No visible focal abnormality. Other: No free fluid or free air. Small bilateral inguinal hernias containing fat. Musculoskeletal: Moderate L1 compression fracture as seen on plain films. IMPRESSION: VASCULAR 5.9 cm infrarenal abdominal aortic aneurysm with extensive mural plaque.  Aneurysm terminates at the aortic bifurcation. 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 There appears to be reconstitution of the right superficial femoral and deep femoral arteries proximally. Diffuse aortoiliac and branch vessel atherosclerosis. NON-VASCULAR Colonic diverticulosis. No acute findings Age-indeterminate moderate L1 compression fracture Electronically Signed: By: Charlett Nose M.D. On: 02/21/2023 17:09   US Aorta  Result Date: 02/21/2023 CLINICAL DATA:  Abdominal aortic aneurysm noted on radiograph. EXAM: ULTRASOUND OF ABDOMINAL AORTA TECHNIQUE: Ultrasound examination of the abdominal aorta and proximal common iliac arteries was performed to evaluate for aneurysm. Additional color and Doppler images of the distal aorta were obtained to document patency. COMPARISON:  February 16, 2023. FINDINGS: Abdominal aortic measurements as follows: Proximal:  1.9 cm Mid:  1.8 cm Distal:  6.0 cm Patent:  Yes, peak systolic velocity is 71 cm/s Right common iliac artery: 2.1 cm Left common iliac artery: 2.1 cm IMPRESSION: 6 cm infrarenal abdominal aortic aneurysm is noted with a large amount of mural thrombus. Dilatation of common iliac arteries is noted bilaterally. Recommend referral to a vascular specialist. This recommendation follows ACR consensus guidelines: White Paper of the ACR Incidental Findings Committee II on Vascular Findings. J Am Coll Radiol 2013; 10:789-794. Electronically Signed   By: Lupita Raider M.D.   On: 02/21/2023 13:22    EKG: I independently viewed the EKG done and my findings are as followed: EKG was not done in the ED  Assessment/Plan Present on Admission:  Abdominal aortic aneurysm  Essential hypertension  GERD (gastroesophageal reflux disease)  Principal Problem:   Abdominal aortic aneurysm Active Problems:   Essential hypertension   GERD (gastroesophageal reflux disease)   Acute pulmonary embolism   Closed compression fracture of L1 lumbar vertebra, initial encounter   Recurrent falls   Hypoalbuminemia due to protein-calorie malnutrition   Mixed hyperlipidemia   Prediabetes   Dementia without behavioral disturbance   Normocytic anemia  Abdominal aortic aneurysm Ultrasound of abdominal aorta showed aneurysm of 6 cm Vascular surgeon (Dr. Randie Heinz) was consulted and recommended admitting patient to [facility] with plan to follow-up on patient on arrival to [facility]  Subsegmental pulmonary embolism Patient was started on IV heparin drip with plan to transition to DOAC in the morning  Age-indeterminate L1 compression fracture in the setting of recurrent falls Continue fall precaution Continue Tylenol as needed Continue PT/OT eval and treat Consider orthopedic/neurosurgery consult for possible TLSO if pain persists  Hypoalbuminemia possibly secondary to mild protein calorie malnutrition Albumin 3.2, protein supplement will be provided  Essential  hypertension Continue amlodipine, losartan  Prediabetes Hemoglobin A1c on 12/08/2022 was 5.8 Continue diet and lifestyle modification at this time  Mixed hyperlipidemia Continue Lipitor  GERD Continue Protonix  Dementia Continue Aricept, Namenda  Normocytic anemia Continue ferrous sulfate, vitamin B12   DVT prophylaxis: Heparin drip  Advance Care Planning: DNR (confirmed with patient and granddaughter at bedside)  Consults: Vascular surgeon (Dr. Randie Heinz) by [facility] ED PA  Family Communication: None at bedside  Severity of Illness: The appropriate patient status for this patient is INPATIENT. Inpatient status is judged to be reasonable and necessary in order to provide the required intensity of service to ensure the patient's safety. The patient's presenting symptoms, physical exam findings, and initial radiographic and laboratory data in the context of their chronic comorbidities is felt to place them at high risk for further clinical deterioration. Furthermore, it is not anticipated that the patient will be medically stable for discharge from the hospital within  2 midnights of admission.   * I certify that at the point of admission it is my clinical judgment that the patient will require inpatient hospital care spanning beyond 2 midnights from the point of admission due to high intensity of service, high risk for further deterioration and high frequency of surveillance required.*  Author: Frankey Shown, DO 02/21/2023 8:28 PM  For on call review [email or web address].

## 2023-02-21 NOTE — Telephone Encounter (Signed)
See result note.  

## 2023-02-22 ENCOUNTER — Other Ambulatory Visit (HOSPITAL_COMMUNITY): Payer: Self-pay

## 2023-02-22 DIAGNOSIS — I714 Abdominal aortic aneurysm, without rupture, unspecified: Secondary | ICD-10-CM

## 2023-02-22 LAB — TYPE AND SCREEN: Unit division: 0

## 2023-02-22 LAB — CBC
HCT: 24.3 % — ABNORMAL LOW (ref 39.0–52.0)
Hemoglobin: 6.9 g/dL — CL (ref 13.0–17.0)
MCH: 22.8 pg — ABNORMAL LOW (ref 26.0–34.0)
MCHC: 28.4 g/dL — ABNORMAL LOW (ref 30.0–36.0)
MCV: 80.2 fL (ref 80.0–100.0)
Platelets: 265 10*3/uL (ref 150–400)
RBC: 3.03 MIL/uL — ABNORMAL LOW (ref 4.22–5.81)
RDW: 18.3 % — ABNORMAL HIGH (ref 11.5–15.5)
WBC: 7.3 10*3/uL (ref 4.0–10.5)
nRBC: 0 % (ref 0.0–0.2)

## 2023-02-22 LAB — MAGNESIUM: Magnesium: 1.9 mg/dL (ref 1.7–2.4)

## 2023-02-22 LAB — COMPREHENSIVE METABOLIC PANEL
ALT: 18 U/L (ref 0–44)
AST: 19 U/L (ref 15–41)
Albumin: 3 g/dL — ABNORMAL LOW (ref 3.5–5.0)
Alkaline Phosphatase: 110 U/L (ref 38–126)
Anion gap: 8 (ref 5–15)
BUN: 24 mg/dL — ABNORMAL HIGH (ref 8–23)
CO2: 26 mmol/L (ref 22–32)
Calcium: 8.7 mg/dL — ABNORMAL LOW (ref 8.9–10.3)
Chloride: 103 mmol/L (ref 98–111)
Creatinine, Ser: 1.05 mg/dL (ref 0.61–1.24)
GFR, Estimated: >60 mL/min (ref >60)
Glucose, Bld: 121 mg/dL — ABNORMAL HIGH (ref 70–99)
Potassium: 3.9 mmol/L (ref 3.5–5.1)
Sodium: 137 mmol/L (ref 135–145)
Total Bilirubin: <0.1 mg/dL — ABNORMAL LOW (ref 0.3–1.2)
Total Protein: 6.7 g/dL (ref 6.5–8.1)

## 2023-02-22 LAB — PHOSPHORUS: Phosphorus: 3.6 mg/dL (ref 2.5–4.6)

## 2023-02-22 LAB — GLUCOSE, CAPILLARY
Glucose-Capillary: 102 mg/dL — ABNORMAL HIGH (ref 70–99)
Glucose-Capillary: 152 mg/dL — ABNORMAL HIGH (ref 70–99)
Glucose-Capillary: 184 mg/dL — ABNORMAL HIGH (ref 70–99)

## 2023-02-22 LAB — HEPARIN LEVEL (UNFRACTIONATED)
Heparin Unfractionated: 0.59 IU/mL (ref 0.30–0.70)
Heparin Unfractionated: 0.69 IU/mL (ref 0.30–0.70)

## 2023-02-22 LAB — PREPARE RBC (CROSSMATCH)

## 2023-02-22 LAB — CBG MONITORING, ED
Glucose-Capillary: 100 mg/dL — ABNORMAL HIGH (ref 70–99)
Glucose-Capillary: 112 mg/dL — ABNORMAL HIGH (ref 70–99)
Glucose-Capillary: 285 mg/dL — ABNORMAL HIGH (ref 70–99)

## 2023-02-22 LAB — HEMOGLOBIN AND HEMATOCRIT, BLOOD
HCT: 24 % — ABNORMAL LOW (ref 39.0–52.0)
HCT: 29.2 % — ABNORMAL LOW (ref 39.0–52.0)
Hemoglobin: 6.7 g/dL — CL (ref 13.0–17.0)
Hemoglobin: 8.6 g/dL — ABNORMAL LOW (ref 13.0–17.0)

## 2023-02-22 LAB — BPAM RBC: Blood Product Expiration Date: 202405262359

## 2023-02-22 LAB — ABO/RH: ABO/RH(D): O POS

## 2023-02-22 MED ORDER — SODIUM CHLORIDE 0.9% IV SOLUTION: Freq: Once | INTRAVENOUS | Status: DC

## 2023-02-22 MED ORDER — SODIUM CHLORIDE 0.9% IV SOLUTION: Freq: Once | INTRAVENOUS | Status: AC

## 2023-02-22 NOTE — Progress Notes (Signed)
Clear liquid tray ordered 

## 2023-02-22 NOTE — ED Notes (Signed)
Liquid diet meal tray given

## 2023-02-22 NOTE — Progress Notes (Signed)
ANTICOAGULATION CONSULT NOTE - Follow-up Consult  Pharmacy Consult for Heparin Indication:  mural thrombus  No Known Allergies  Patient Measurements: Height:  (170.2 cm) Weight: 64.9 kg (143 lb) IBW/kg (Calculated) : 66.1 Heparin Dosing Weight: HEPARIN DW (KG): 64.9   Vital Signs: Temp: 97.5 F (36.4 C) (04/23 1119) Temp Source: Oral (04/23 1119) BP: 131/74 (04/23 1400) Pulse Rate: 74 (04/23 1400)  Labs: Recent Labs    02/21/23 1256 02/22/23 0343 02/22/23 0502 02/22/23 1141 02/22/23 1339  HGB 7.6* 6.9* 6.7* 8.6*  --   HCT 27.2* 24.3* 24.0* 29.2*  --   PLT 286 265  --   --   --   HEPARINUNFRC  --  0.59  --   --  0.69  CREATININE 1.38* 1.05  --   --   --     Estimated Creatinine Clearance: 54.1 mL/min (by C-G formula based on SCr of 1.05 mg/dL).   Medical History: Past Medical History:  Diagnosis Date   Allergy    Cancer    Skin cancer   Cataract    Coronary artery disease    Diabetes mellitus    Diverticulosis    Esophageal stricture    GERD (gastroesophageal reflux disease)    Hyperlipidemia    Hypertension    Peripheral vascular disease     Medications:  (Not in a hospital admission)  Medication History pending.  No anticoagulation PTA.  Assessment: 77 yo M with ongoing pain s/p fall ~ 2 weeks.  Had additional imaging 4/22 which showed aneurysm for which pt was sent to the ED for further imaging.  [place] and CT showed 6 cm infrarenal abdominal aortic aneurysm is noted with a large amount of mural thrombus. Pharmacy has been consulted to dose heparin.  Noted anemia, improved today with hgb 8.6. Heparin level at upper end of goal at 0.69. No bleeding or IV issues noted.   Goal of Therapy:  Heparin level 0.3-0.7 units/ml Monitor platelets by anticoagulation protocol: Yes  Plan:  Decrease heparin to 900 units/hr to keep within goal Monitor CBC closely due to patients anemia Continue to monitor for signs/symptoms of bleeding  Sheppard Coil  PharmD., BCPS Clinical Pharmacist 02/22/2023 3:32 PM

## 2023-02-22 NOTE — Progress Notes (Signed)
ANTICOAGULATION CONSULT NOTE - Follow-up Consult  Pharmacy Consult for Heparin Indication:  mural thrombus  No Known Allergies  Patient Measurements: Height:  (170.2 cm) Weight: 64.9 kg (143 lb) IBW/kg (Calculated) : 66.1 Heparin Dosing Weight: HEPARIN DW (KG): 64.9   Vital Signs: BP: 141/64 (04/23 0430) Pulse Rate: 64 (04/23 0430)  Labs: Recent Labs    02/21/23 1256 02/22/23 0343  HGB 7.6* 6.9*  HCT 27.2* 24.3*  PLT 286 265  HEPARINUNFRC  --  0.59  CREATININE 1.38* 1.05     Estimated Creatinine Clearance: 54.1 mL/min (by C-G formula based on SCr of 1.05 mg/dL).   Medical History: Past Medical History:  Diagnosis Date   Allergy    Cancer    Skin cancer   Cataract    Coronary artery disease    Diabetes mellitus    Diverticulosis    Esophageal stricture    GERD (gastroesophageal reflux disease)    Hyperlipidemia    Hypertension    Peripheral vascular disease     Medications:  (Not in a hospital admission)  Medication History pending.  No anticoagulation PTA.  Assessment: 77 yo M with ongoing pain s/p fall ~ 2 weeks.  Had additional imaging 4/22 which showed aneurysm for which pt was sent to the ED for further imaging.  [place] and CT showed 6 cm infrarenal abdominal aortic aneurysm is noted with a large amount of mural thrombus.  Pharmacy has been consulted to dose heparin.  Noted anemia with Hgb 7.6.  Unclear baseline as last Hgb on record from 2020 was 12.  Goal of Therapy:  Heparin level 0.3-0.7 units/ml Monitor platelets by anticoagulation protocol: Yes   4/23 AM Update HL therapeutic at 0.59 after heparin bolus of 3000units x 1 dose and infusion rate of 1000units/hr. Hgb 7.6>>6.9, Plts 286>>265, No noted bleeding.  Plan:  Continue current heparin infusion rate of 1000units/hr Recheck HL in 8 hours Monitor CBC closely due to patients anemia Continue to monitor for signs/symptoms of bleeding  Fredirick Lathe, Student Pharmacist 02/22/2023  5:06 AM

## 2023-02-22 NOTE — TOC Benefit Eligibility Note (Signed)
Patient Advocate Encounter  Insurance verification completed.    The patient is currently admitted and upon discharge could be taking Eliquis 5 mg.  The current 30 day co-pay is $47.00.   The patient is insured through [employer] Medicare Part D   This test claim was processed through [facility]- copay amounts may vary at other pharmacies due to pharmacy/plan contracts, or as the patient moves through the different stages of their insurance plan.  Eden Toohey, CPHT Pharmacy Patient Advocate Specialist [facility] Patient Advocate Team Direct Number: [phone]  Fax: [phone]       

## 2023-02-22 NOTE — ED Notes (Signed)
Pt dc with [facility]. Nad.

## 2023-02-22 NOTE — Progress Notes (Signed)
PROGRESS NOTE    Walter [name]  UJW:[id number] DOB: Feb 07, 1946 DOA: 02/21/2023 PCP: Kristian Covey, MD   Brief Narrative:    Walter [name] is a 77 y.o. male with medical history significant of hypertension, hyperlipidemia, T2DM, GERD, dementia who presents to the emergency department for evaluation of abnormal aortic aneurysm.  He is noted to have PE and was started on IV heparin drip and is also having worsening anemia with no overt bleeding noted requiring 1 unit PRBC transfusion.  He is currently awaiting transfer to [facility] for evaluation of his abdominal aortic aneurysm per vascular surgery.  Assessment & Plan:   Principal Problem:   Abdominal aortic aneurysm Active Problems:   Essential hypertension   GERD (gastroesophageal reflux disease)   Acute pulmonary embolism   Closed compression fracture of L1 lumbar vertebra, initial encounter   Recurrent falls   Hypoalbuminemia due to protein-calorie malnutrition   Mixed hyperlipidemia   Prediabetes   Dementia without behavioral disturbance   Normocytic anemia  Assessment and Plan:   Abdominal aortic aneurysm Ultrasound of abdominal aorta showed aneurysm of 6 cm Vascular surgeon (Dr. Randie Heinz) was consulted and recommended admitting patient to [facility] with plan to follow-up on patient on arrival to [facility]   Subsegmental pulmonary embolism Patient was started on IV heparin drip, continue to monitor hemoglobin and hematocrit carefully  Acute anemia Patient noted to be on iron and B12 supplementation at home Continue to monitor H/H carefully Status post 1 unit PRBC transfusion with no overt bleeding noted   Age-indeterminate L1 compression fracture in the setting of recurrent falls Continue fall precaution Continue Tylenol as needed Continue PT/OT eval and treat Consider orthopedic/neurosurgery consult for possible TLSO if pain persists   Hypoalbuminemia possibly secondary to mild protein calorie  malnutrition Albumin 3.2, protein supplement will be provided   Essential hypertension Continue amlodipine, losartan   Prediabetes Hemoglobin A1c on 12/08/2022 was 5.8 Continue diet and lifestyle modification at this time   Mixed hyperlipidemia Continue Lipitor   GERD Continue Protonix   Dementia Continue Aricept, Namenda   DVT prophylaxis: Heparin drip Code Status: DNR Family Communication: Granddaughter at bedside Disposition Plan: Transfer to [facility] for vascular evaluation Status is: Inpatient Remains inpatient appropriate because: Need for IV medications.   Consultants:  Vascular surgery-Dr. Randie Heinz  Procedures:  None  Antimicrobials:  None   Subjective: Patient seen and evaluated today with no new acute complaints or concerns. No acute concerns or events noted overnight.  He states that he has a significant amount of pain if he moves to his low back, otherwise denies any concerns.  Objective: Vitals:   02/22/23 0723 02/22/23 0738 02/22/23 0818 02/22/23 0818  BP:  (!) 143/70 (!) 153/68 (!) 153/68  Pulse:  72 70 68  Resp:  Temp: 97.7 F (36.5 C) 98 F (36.7 C)  97.6 F (36.4 C)  TempSrc: Oral Oral  Oral  SpO2:  95% 97% 95%  Weight:      Height:       No intake or output data in the 24 hours ending 02/22/23 0838 Filed Weights   02/21/23 1159  Weight: 64.9 kg    Examination:  General exam: Appears calm and comfortable  Respiratory system: Clear to auscultation. Respiratory effort normal. Cardiovascular system: S1 & S2 heard, RRR.  Gastrointestinal system: Abdomen is soft Central nervous system: Alert and awake Extremities: No edema Skin: No significant lesions noted Psychiatry: Flat affect.    Data  Reviewed: I have personally reviewed following labs and imaging studies  CBC: Recent Labs  Lab 02/16/23 1059 02/21/23 1256 02/22/23 0343 02/22/23 0502  WBC 11.2* 10.5 7.3  --   HGB 7.4 Repeated and verified X2.* 7.6* 6.9* 6.7*   HCT 24.8 Repeated and verified X2.* 27.2* 24.3* 24.0*  MCV 75.5* 81.4 80.2  --   PLT 270.0 286 265  --    Basic Metabolic Panel: Recent Labs  Lab 02/16/23 1059 02/21/23 1256 02/22/23 0343  NA 141 138 137  K 4.2 4.1 3.9  CL 103 103 103  CO2 GLUCOSE 113* 129* 121*  BUN 31* 27* 24*  CREATININE 1.53* 1.38* 1.05  CALCIUM 9.4 8.7* 8.7*  MG  --   --  1.9  PHOS  --   --  3.6   GFR: Estimated Creatinine Clearance: 54.1 mL/min (by C-G formula based on SCr of 1.05 mg/dL). Liver Function Tests: Recent Labs  Lab 02/21/23 1256 02/22/23 0343  AST 22 19  ALT 18 18  ALKPHOS 108 110  BILITOT 0.5 <0.1*  PROT 7.4 6.7  ALBUMIN 3.2* 3.0*   No results for input(s): "LIPASE", "AMYLASE" in the last 168 hours. No results for input(s): "AMMONIA" in the last 168 hours. Coagulation Profile: No results for input(s): "INR", "PROTIME" in the last 168 hours. Cardiac Enzymes: No results for input(s): "CKTOTAL", "CKMB", "CKMBINDEX", "TROPONINI" in the last 168 hours. BNP (last 3 results) No results for input(s): "PROBNP" in the last 8760 hours. HbA1C: No results for input(s): "HGBA1C" in the last 72 hours. CBG: No results for input(s): "GLUCAP" in the last 168 hours. Lipid Profile: No results for input(s): "CHOL", "HDL", "LDLCALC", "TRIG", "CHOLHDL", "LDLDIRECT" in the last 72 hours. Thyroid Function Tests: No results for input(s): "TSH", "T4TOTAL", "FREET4", "T3FREE", "THYROIDAB" in the last 72 hours. Anemia Panel: No results for input(s): "VITAMINB12", "FOLATE", "FERRITIN", "TIBC", "IRON", "RETICCTPCT" in the last 72 hours. Sepsis Labs: No results for input(s): "PROCALCITON", "LATICACIDVEN" in the last 168 hours.  No results found for this or any previous visit (from the past 240 hour(s)).       Radiology Studies: CT Angio Abd/Pel W and/or Wo Contrast  Addendum Date: 02/21/2023   ADDENDUM REPORT: 02/21/2023 17:45 ADDENDUM: Within the images through the lower chest, there  is a small subsegmental incidental pulmonary embolus noted in a right lower lobe anterolateral pulmonary arterial branch. Clinical significance is questionable due to its small size. These results were called by telephone at the time of interpretation on 02/21/2023 at 5:42 pm to provider Dr. Estell Harpin, who verbally acknowledged these results. Electronically Signed   By: Charlett Nose M.D.   On: 02/21/2023 17:45   Result Date: 02/21/2023 CLINICAL DATA:  back pain, aortic aneurysm, lumbar spine compression fracture. Fall. AAA seen on ultrasound. EXAM: CTA ABDOMEN AND PELVIS WITHOUT AND WITH CONTRAST TECHNIQUE: Multidetector CT imaging of the abdomen and pelvis was performed using the standard protocol during bolus administration of intravenous contrast. Multiplanar reconstructed images and MIPs were obtained and reviewed to evaluate the vascular anatomy. RADIATION DOSE REDUCTION: This exam was performed according to the departmental dose-optimization program which includes automated exposure control, adjustment of the mA and/or kV according to patient size and/or use of iterative reconstruction technique. CONTRAST:  80mL OMNIPAQUE IOHEXOL 350 MG/ML SOLN COMPARISON:  Ultrasound performed today. FINDINGS: VASCULAR Aorta: Heavily calcified aorta. Large infrarenal abdominal aortic aneurysm measuring up to 5.9 cm in maximum diameter. Extensive mural plaque. Aneurysm terminates at the aortic bifurcation.  No dissection. Celiac: Heavily calcified. No aneurysm or dissection. No significant stenosis. SMA: Moderate calcifications. No aneurysm or dissection or stenosis. Renals: Moderate calcifications. No aneurysm, dissection or stenosis. IMA: Patent.  No aneurysm, dissection or stenosis. Inflow: Heavily calcified. No aneurysm or dissection. 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 Proximal Outflow: Right common femoral artery occluded. There may be some reconstitution of the proximal superficial femoral and deep  femoral artery. Heavily calcified common femoral arteries bilaterally. Left common femoral, proximal superficial femoral and deep femoral arteries are patent. Veins: No obvious venous abnormality within the limitations of this arterial phase study. Review of the MIP images confirms the above findings. NON-VASCULAR Lower chest: Linear areas of scarring.  No acute findings. Hepatobiliary: No focal hepatic abnormality. Gallbladder unremarkable. Pancreas: No focal abnormality or ductal dilatation. Spleen: No focal abnormality.  Normal size. Adrenals/Urinary Tract: 1.9 cm cyst in the midpole of the right kidney appears benign. No follow-up imaging recommended. No hydronephrosis. Adrenal glands and urinary bladder unremarkable. Stomach/Bowel: Diffuse colonic diverticulosis. No active diverticulitis. Stomach and small bowel decompressed, unremarkable. Lymphatic: No adenopathy Reproductive: No visible focal abnormality. Other: No free fluid or free air. Small bilateral inguinal hernias containing fat. Musculoskeletal: Moderate L1 compression fracture as seen on plain films. IMPRESSION: VASCULAR 5.9 cm infrarenal abdominal aortic aneurysm with extensive mural plaque. Aneurysm terminates at the aortic bifurcation. 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 There appears to be reconstitution of the right superficial femoral and deep femoral arteries proximally. Diffuse aortoiliac and branch vessel atherosclerosis. NON-VASCULAR Colonic diverticulosis. No acute findings Age-indeterminate moderate L1 compression fracture Electronically Signed: By: Charlett Nose M.D. On: 02/21/2023 17:09   US Aorta  Result Date: 02/21/2023 CLINICAL DATA:  Abdominal aortic aneurysm noted on radiograph. EXAM: ULTRASOUND OF ABDOMINAL AORTA TECHNIQUE: Ultrasound examination of the abdominal aorta and proximal common iliac arteries was performed to evaluate for aneurysm. Additional color and Doppler images of the  distal aorta were obtained to document patency. COMPARISON:  February 16, 2023. FINDINGS: Abdominal aortic measurements as follows: Proximal:  1.9 cm Mid:  1.8 cm Distal:  6.0 cm Patent: Yes, peak systolic velocity is 71 cm/s Right common iliac artery: 2.1 cm Left common iliac artery: 2.1 cm IMPRESSION: 6 cm infrarenal abdominal aortic aneurysm is noted with a large amount of mural thrombus. Dilatation of common iliac arteries is noted bilaterally. Recommend referral to a vascular specialist. This recommendation follows ACR consensus guidelines: White Paper of the ACR Incidental Findings Committee II on Vascular Findings. J Am Coll Radiol 2013; 10:789-794. Electronically Signed   By: Lupita Raider M.D.   On: 02/21/2023 13:22        Scheduled Meds:  sodium chloride   Intravenous Once   sodium chloride   Intravenous Once   amLODipine  5 mg Oral Daily   atorvastatin  40 mg Oral Daily   cyanocobalamin  2,000 mcg Oral Daily   donepezil  10 mg Oral QHS   feeding supplement (GLUCERNA SHAKE)  237 mL Oral TID BM   ferrous sulfate  325 mg Oral Q breakfast   losartan  100 mg Oral Daily   memantine  10 mg Oral BID   pantoprazole  40 mg Oral Daily   Continuous Infusions:  heparin 1,000 Units/hr (02/21/23 1931)     LOS: 1 day    Time spent: 35 minutes    Teresha Hanks Hoover Brunette, DO [facility]  If 7PM-7AM, please contact night-coverage [email or web address] 02/22/2023, 8:38 AM

## 2023-02-22 NOTE — TOC Progression Note (Signed)
  Transition of Care [facility]) Screening Note   Patient Details  Name: Walter [name] Date of Birth: [birth year]   Transition of Care [facility]) CM/SW Contact:    Leitha Bleak, RN Phone Number: 02/22/2023, 3:12 PM    Transition of Care Department [facility]) has reviewed patient and no TOC needs have been identified at this time. We will continue to monitor patient advancement through interdisciplinary progression rounds. If new patient transition needs arise, please place a TOC consult.     Barriers to Discharge: Continued Medical Work up  Expected Discharge Plan and Services       Social Determinants of Health (SDOH) Interventions SDOH Screenings   Food Insecurity: No Food Insecurity (04/23/2022)  Housing: Low Risk  (04/23/2022)  Transportation Needs: No Transportation Needs (04/23/2022)  Alcohol Screen: Low Risk  (04/23/2022)  Depression (PHQ2-9): High Risk (02/16/2023)  Financial Resource Strain: Low Risk  (04/23/2022)  Physical Activity: Inactive (04/23/2022)  Social Connections: Socially Integrated (04/23/2022)  Stress: No Stress Concern Present (04/23/2022)  Tobacco Use: Medium Risk (02/21/2023)    Readmission Risk Interventions     No data to display

## 2023-02-22 NOTE — Progress Notes (Signed)
Pt arrived to 6 north room 6 via [place]. Patient is alert and oriented x4, no skin issues. Tele attached. Pain level 0/10. Bed in lowest position. Call light in reach. Family at bedside. Will continue to monitor pt.

## 2023-02-22 NOTE — ED Notes (Signed)
[facility] at bedside. Pt in nad

## 2023-02-23 ENCOUNTER — Other Ambulatory Visit: Payer: Medicare HMO

## 2023-02-23 DIAGNOSIS — I7143 Infrarenal abdominal aortic aneurysm, without rupture: Secondary | ICD-10-CM

## 2023-02-23 DIAGNOSIS — E43 Unspecified severe protein-calorie malnutrition: Secondary | ICD-10-CM | POA: Insufficient documentation

## 2023-02-23 DIAGNOSIS — S32010A Wedge compression fracture of first lumbar vertebra, initial encounter for closed fracture: Secondary | ICD-10-CM | POA: Diagnosis not present

## 2023-02-23 DIAGNOSIS — I2699 Other pulmonary embolism without acute cor pulmonale: Secondary | ICD-10-CM | POA: Diagnosis not present

## 2023-02-23 DIAGNOSIS — F0390 Unspecified dementia without behavioral disturbance: Secondary | ICD-10-CM | POA: Diagnosis not present

## 2023-02-23 DIAGNOSIS — I714 Abdominal aortic aneurysm, without rupture, unspecified: Secondary | ICD-10-CM | POA: Diagnosis not present

## 2023-02-23 LAB — BASIC METABOLIC PANEL
Anion gap: 14 (ref 5–15)
BUN: 14 mg/dL (ref 8–23)
CO2: 23 mmol/L (ref 22–32)
Calcium: 8.8 mg/dL — ABNORMAL LOW (ref 8.9–10.3)
Chloride: 102 mmol/L (ref 98–111)
Creatinine, Ser: 1.07 mg/dL (ref 0.61–1.24)
GFR, Estimated: >60 mL/min (ref >60)
Glucose, Bld: 108 mg/dL — ABNORMAL HIGH (ref 70–99)
Potassium: 4 mmol/L (ref 3.5–5.1)
Sodium: 139 mmol/L (ref 135–145)

## 2023-02-23 LAB — TYPE AND SCREEN
ABO/RH(D): O POS
ABO/RH(D): O POS
Antibody Screen: NEGATIVE
Antibody Screen: NEGATIVE

## 2023-02-23 LAB — GLUCOSE, CAPILLARY
Glucose-Capillary: 116 mg/dL — ABNORMAL HIGH (ref 70–99)
Glucose-Capillary: 122 mg/dL — ABNORMAL HIGH (ref 70–99)
Glucose-Capillary: 163 mg/dL — ABNORMAL HIGH (ref 70–99)
Glucose-Capillary: 197 mg/dL — ABNORMAL HIGH (ref 70–99)
Glucose-Capillary: 216 mg/dL — ABNORMAL HIGH (ref 70–99)
Glucose-Capillary: 94 mg/dL (ref 70–99)
Glucose-Capillary: 97 mg/dL (ref 70–99)

## 2023-02-23 LAB — RETICULOCYTES
Immature Retic Fract: 20 % — ABNORMAL HIGH (ref 2.3–15.9)
RBC.: 3.51 MIL/uL — ABNORMAL LOW (ref 4.22–5.81)
Retic Count, Absolute: 69.1 10*3/uL (ref 19.0–186.0)
Retic Ct Pct: 2 % (ref 0.4–3.1)

## 2023-02-23 LAB — CBC
HCT: 27.8 % — ABNORMAL LOW (ref 39.0–52.0)
Hemoglobin: 8.4 g/dL — ABNORMAL LOW (ref 13.0–17.0)
MCH: 23.4 pg — ABNORMAL LOW (ref 26.0–34.0)
MCHC: 30.2 g/dL (ref 30.0–36.0)
MCV: 77.4 fL — ABNORMAL LOW (ref 80.0–100.0)
Platelets: 282 10*3/uL (ref 150–400)
RBC: 3.59 MIL/uL — ABNORMAL LOW (ref 4.22–5.81)
RDW: 17.9 % — ABNORMAL HIGH (ref 11.5–15.5)
WBC: 7 10*3/uL (ref 4.0–10.5)
nRBC: 0 % (ref 0.0–0.2)

## 2023-02-23 LAB — BPAM RBC
ISSUE DATE / TIME: 202404230747
Unit Type and Rh: 5100

## 2023-02-23 LAB — IRON AND TIBC
Iron: 16 ug/dL — ABNORMAL LOW (ref 45–182)
Saturation Ratios: 4 % — ABNORMAL LOW (ref 17.9–39.5)
TIBC: 377 ug/dL (ref 250–450)
UIBC: 361 ug/dL

## 2023-02-23 LAB — HEPARIN LEVEL (UNFRACTIONATED): Heparin Unfractionated: 0.7 IU/mL (ref 0.30–0.70)

## 2023-02-23 LAB — FERRITIN: Ferritin: 23 ng/mL — ABNORMAL LOW (ref 24–336)

## 2023-02-23 LAB — FOLATE: Folate: 13.2 ng/mL (ref >5.9)

## 2023-02-23 LAB — VITAMIN B12: Vitamin B-12: 678 pg/mL (ref 180–914)

## 2023-02-23 LAB — MAGNESIUM: Magnesium: 1.8 mg/dL (ref 1.7–2.4)

## 2023-02-23 MED ORDER — PROSOURCE PLUS PO LIQD
30.0000 mL | Freq: Every day | ORAL | Status: DC
  Administered 2023-02-23 – 2023-03-04 (×7): 30 mL via ORAL
  Filled 2023-02-23 (×10): qty 30

## 2023-02-23 MED ORDER — ADULT MULTIVITAMIN W/MINERALS CH
1.0000 | ORAL_TABLET | Freq: Every day | ORAL | Status: DC
  Administered 2023-02-23 – 2023-03-10 (×15): 1 via ORAL
  Filled 2023-02-23 (×16): qty 1

## 2023-02-23 MED ORDER — HYDRALAZINE HCL 10 MG PO TABS: 10.0000 mg | ORAL_TABLET | Freq: Four times a day (QID) | ORAL | Status: DC | PRN

## 2023-02-23 MED ORDER — BOOST / RESOURCE BREEZE PO LIQD CUSTOM
1.0000 | ORAL | Status: DC
  Administered 2023-02-23 – 2023-03-03 (×7): 1 via ORAL

## 2023-02-23 MED ORDER — LIDOCAINE 5 % EX PTCH
1.0000 | MEDICATED_PATCH | CUTANEOUS | Status: DC
  Administered 2023-02-23 – 2023-03-01 (×7): 1 via TRANSDERMAL
  Filled 2023-02-23 (×9): qty 1

## 2023-02-23 MED ORDER — INSULIN ASPART 100 UNIT/ML IJ SOLN
0.0000 [IU] | Freq: Three times a day (TID) | INTRAMUSCULAR | Status: DC
  Administered 2023-02-23: 2 [IU] via SUBCUTANEOUS
  Administered 2023-02-24: 3 [IU] via SUBCUTANEOUS
  Administered 2023-02-24: 2 [IU] via SUBCUTANEOUS
  Administered 2023-02-25: 1 [IU] via SUBCUTANEOUS
  Administered 2023-02-25: 5 [IU] via SUBCUTANEOUS
  Administered 2023-02-26: 3 [IU] via SUBCUTANEOUS
  Administered 2023-02-26: 2 [IU] via SUBCUTANEOUS
  Administered 2023-02-27: 1 [IU] via SUBCUTANEOUS
  Administered 2023-02-27: 3 [IU] via SUBCUTANEOUS
  Administered 2023-02-27: 2 [IU] via SUBCUTANEOUS
  Administered 2023-02-28: 1 [IU] via SUBCUTANEOUS
  Administered 2023-02-28 – 2023-03-01 (×2): 2 [IU] via SUBCUTANEOUS
  Administered 2023-03-01: 1 [IU] via SUBCUTANEOUS
  Administered 2023-03-02: 3 [IU] via SUBCUTANEOUS
  Administered 2023-03-03: 2 [IU] via SUBCUTANEOUS
  Administered 2023-03-03 – 2023-03-04 (×2): 1 [IU] via SUBCUTANEOUS
  Administered 2023-03-04: 3 [IU] via SUBCUTANEOUS
  Administered 2023-03-04: 2 [IU] via SUBCUTANEOUS
  Administered 2023-03-05 (×2): 3 [IU] via SUBCUTANEOUS
  Administered 2023-03-06: 2 [IU] via SUBCUTANEOUS
  Administered 2023-03-07 – 2023-03-08 (×2): 3 [IU] via SUBCUTANEOUS
  Administered 2023-03-09: 1 [IU] via SUBCUTANEOUS

## 2023-02-23 MED ORDER — MAGIC MOUTHWASH
5.0000 mL | Freq: Three times a day (TID) | ORAL | Status: DC
  Administered 2023-02-23 – 2023-03-10 (×38): 5 mL via ORAL
  Filled 2023-02-23 (×51): qty 5

## 2023-02-23 MED ORDER — ENSURE ENLIVE PO LIQD
237.0000 mL | Freq: Two times a day (BID) | ORAL | Status: DC
  Administered 2023-02-23 – 2023-03-08 (×18): 237 mL via ORAL

## 2023-02-23 NOTE — Social Work (Signed)
  Transition of Care [facility]) Screening Note   Patient Details  Name: Walter [name] Date of Birth: [birth year]   Transition of Care [facility]) CM/SW Contact:    Carley Hammed, LCSW Phone Number: 02/23/2023, 1:56 PM    Transition of Care Department [facility]) has reviewed patient and no TOC needs have been identified at this time. We will continue to monitor patient advancement through interdisciplinary progression rounds. If new patient transition needs arise, please place a TOC consult.

## 2023-02-23 NOTE — Consult Note (Addendum)
Hospital Consult    Reason for Consult:  infrarenal AAA  Requesting Physician:  Frankey Shown DO MRN #:  [id number]  History of Present Illness: Walter [name] is a 77 y.o. male with a past medical history of CAD, DMII, GERD, HTN, HLD, and dementia who presented to the hospital with back pain.  He has a 2-week history of lumbar/tailbone pain that started after sustaining a fall from a standing position.  He followed up with his PCP, and x-ray showed an age-indeterminate L1 compression fracture, degenerative disc disease, and nonruptured distal AAA measuring 6 cm. He was asked to go to the emergency room for further aneurysm workup.  At the ED he was found to have a PE and was started on heparin. Upon admission to the hospital, he explains he has also not been able to tolerate most foods for several months without having multiple frequent bowel movements/diarrhea.  He denies any fever, chills, chest pain, shortness of breath, nausea or vomiting.    Vascular was consulted for management of his AAA.  The patient denies knowing about this AAA.  He endorses a chronic history of back pain, with recent worsening of lower back pain after a fall.  Today his lower back feels "sore" but better than a few weeks ago.  His back pain is aggravated with movement.  He denies any abdominal pain.  He denies any pain in his legs or feet. He denies any shortness of breath or chest pain  The pt is on a statin for cholesterol management.  The pt is not on a daily aspirin.   Other AC:   The pt is on amlodipine, losartan for hypertension.   The pt is diabetic.   Tobacco hx:  former  Past Medical History:  Diagnosis Date   Allergy    Cancer    Skin cancer   Cataract    Coronary artery disease    Diabetes mellitus    Diverticulosis    Esophageal stricture    GERD (gastroesophageal reflux disease)    Hyperlipidemia    Hypertension    Peripheral vascular disease     Past Surgical History:  Procedure  Laterality Date   CORONARY ANGIOPLASTY WITH STENT PLACEMENT     ESOPHAGOGASTRODUODENOSCOPY  06/07/2012   Procedure: ESOPHAGOGASTRODUODENOSCOPY (EGD);  Surgeon: Hart Carwin, MD;  Location: [facility];  Service: Endoscopy;  Laterality: N/A;   SHOULDER SURGERY  2006    No Known Allergies  Prior to Admission medications   Medication Sig Start Date End Date Taking? Authorizing Provider  amLODipine (NORVASC) 5 MG tablet Take 1 tablet by mouth once daily Patient taking differently: Take 5 mg by mouth daily. 01/24/23  Yes Burchette, Elberta Fortis, MD  atorvastatin (LIPITOR) 40 MG tablet Take 1 tablet by mouth once daily Patient taking differently: Take 40 mg by mouth daily. 09/20/22  Yes Burchette, Elberta Fortis, MD  cyanocobalamin 2000 MCG tablet Take 1 tablet (2,000 mcg total) by mouth daily. 04/24/19  Yes Waldon Merl, PA-C  Dihydroxyaluminum Sod Carb (ROLAIDS PO) Take 1 tablet by mouth as needed (heartburn).   Yes [provider]  donepezil (ARICEPT) 10 MG tablet Take 1 tablet daily Patient taking differently: Take 10 mg by mouth daily. 10/29/22  Yes Van Clines, MD  Esomeprazole Magnesium (NEXIUM PO) Take 1 tablet by mouth daily as needed (acid reflux).   Yes [provider]  Iron, Ferrous Sulfate, 325 (65 Fe) MG TABS Take 325 mg by mouth daily. 02/16/23  Yes  [place], Betty G, MD  losartan (COZAAR) 100 MG tablet Take 1 tablet by mouth once daily 12/13/22  Yes Burchette, Elberta Fortis, MD  memantine (NAMENDA) 10 MG tablet Take 1 tablet twice a day Patient taking differently: Take 10 mg by mouth 2 (two) times daily. 10/29/22  Yes Van Clines, MD  metFORMIN (GLUCOPHAGE) 1000 MG tablet Take 1 tablet (1,000 mg total) by mouth 2 (two) times daily with a meal. 02/16/23  Yes [place], Betty G, MD  OVER THE COUNTER MEDICATION Take 1 tablet by mouth daily. Allergy relief   Yes [provider]  tiZANidine (ZANAFLEX) 4 MG tablet Take 0.5-1 tablets (2-4 mg total) by mouth at bedtime as  needed for muscle spasms. 02/16/23 03/18/23 Yes [place], Betty G, MD  Blood Glucose Monitoring Suppl (ONE TOUCH ULTRA 2) w/Device KIT 1 kit by Does not apply route daily. 07/21/20   Waldon Merl, PA-C  glucose blood (ONETOUCH ULTRA) test strip USE 1 STRIP TO CHECK GLUCOSE ONCE DAILY 12/20/22   Burchette, Elberta Fortis, MD  Lancets [facility] ULTRASOFT) lancets Check blood sugars once daily. Dx: E11.51 07/21/20   Waldon Merl, PA-C    Social History   Socioeconomic History   Marital status: Married    Spouse name: Not on file   Number of children: 3   Years of education: Not on file   Highest education level: Not on file  Occupational History   Occupation: Auditor  Tobacco Use   Smoking status: Former    Types: Cigarettes    Quit date: 11/02/1999    Years since quitting: 23.3   Smokeless tobacco: Never  Vaping Use   Vaping Use: Never used  Substance and Sexual Activity   Alcohol use: No   Drug use: No   Sexual activity: Not Currently    Partners: Female  Other Topics Concern   Not on file  Social History Narrative   Daily caffeine       Right handed      Lives with wife in a one story home      Social Determinants of Health   Financial Resource Strain: Low Risk  (04/23/2022)   Overall Financial Resource Strain (CARDIA)    Difficulty of Paying Living Expenses: Not hard at all  Food Insecurity: Patient Declined (02/22/2023)   Hunger Vital Sign    Worried About Running Out of Food in the Last Year: Patient declined    Ran Out of Food in the Last Year: Patient declined  Transportation Needs: Patient Declined (02/22/2023)   PRAPARE - Administrator, Civil Service (Medical): Patient declined    Lack of Transportation (Non-Medical): Patient declined  Physical Activity: Inactive (04/23/2022)   Exercise Vital Sign    Days of Exercise per Week: 0 days    Minutes of Exercise per Session: 0 min  Stress: No Stress Concern Present (04/23/2022)   [employer] of  Occupational Health - Occupational Stress Questionnaire    Feeling of Stress : Not at all  Social Connections: Socially Integrated (04/23/2022)   Social Connection and Isolation Panel [NHANES]    Frequency of Communication with Friends and Family: More than three times a week    Frequency of Social Gatherings with Friends and Family: More than three times a week    Attends Religious Services: More than 4 times per year    Active Member of [employer] or Organizations: Yes    Attends Banker Meetings: More than 4 times per year  Marital Status: Married  Catering manager Violence: Patient Declined (02/22/2023)   Humiliation, Afraid, Rape, and Kick questionnaire    Fear of Current or Ex-Partner: Patient declined    Emotionally Abused: Patient declined    Physically Abused: Patient declined    Sexually Abused: Patient declined     Family History  Problem Relation Age of Onset   Breast cancer Mother    Healthy Son    Healthy Son    Parkinson's disease Sister    Colon cancer Neg Hx    Esophageal cancer Neg Hx    Rectal cancer Neg Hx    Stomach cancer Neg Hx     ROS: [x]  Positive   [ ]  Negative   [ ]  All sytems reviewed and are negative  Cardiac: []  chest pain/pressure []  hx MI []  SOB   Vascular: []  pain in legs while walking []  pain in legs at rest []  pain in legs at night []  non-healing ulcers []  hx of DVT []  swelling in legs  Pulmonary: []  asthma/wheezing []  home O2  Neurologic: []  hx of CVA []  mini stroke   Hematologic: []  hx of cancer  Endocrine:   [x]  diabetes []  thyroid disease  GI [x]  GERD  GU: []  CKD/renal failure []  HD--[]  M/W/F or []  T/T/S  Psychiatric: []  anxiety []  depression  Musculoskeletal: []  arthritis []  joint pain  Integumentary: []  rashes []  ulcers  Constitutional: []  fever  []  chills  Physical Examination  Vitals:   02/23/23 0334 02/23/23 0822  BP: (!) 153/70 (!) 159/68  Pulse: 68 68  Resp:  18  Temp:  97.9  F (36.6 C)  SpO2: 96% 98%   Body mass index is 22.4 kg/m.  General: chronically ill appearing male, in NAD Gait: Not observed HENT: WNL, normocephalic Pulmonary: normal non-labored breathing Cardiac: regular, without carotid bruit Abdomen:  soft, NT  Skin: without rashes Vascular Exam/Pulses: Nonpalpable right femoral pulse. Palpable left femoral pulse. Palpable DP pulses 2+ Extremities: No gangrene, wounds, cellulitis Musculoskeletal: no muscle wasting or atrophy  Neurologic: A&O X 3 Psychiatric:  The pt has Normal affect.   CBC    Component Value Date/Time   WBC 7.0 02/23/2023 0627   RBC 3.59 (L) 02/23/2023 0627   HGB 8.4 (L) 02/23/2023 0627   HCT 27.8 (L) 02/23/2023 0627   PLT 282 02/23/2023 0627   MCV 77.4 (L) 02/23/2023 0627   MCH 23.4 (L) 02/23/2023 0627   MCHC 30.2 02/23/2023 0627   RDW 17.9 (H) 02/23/2023 0627   LYMPHSABS 1.0 04/19/2019 0956   MONOABS 0.4 04/19/2019 0956   EOSABS 0.2 04/19/2019 0956   BASOSABS 0.0 04/19/2019 0956    BMET    Component Value Date/Time   NA 139 02/23/2023 0627   K 4.0 02/23/2023 0627   CL 102 02/23/2023 0627   CO2 23 02/23/2023 0627   GLUCOSE 108 (H) 02/23/2023 0627   GLUCOSE 127 (H) 08/17/2006 1059   BUN 14 02/23/2023 0627   CREATININE 1.07 02/23/2023 0627   CALCIUM 8.8 (L) 02/23/2023 0627   GFRNONAA >60 02/23/2023 0627   GFRAA >60 01/07/2019 0751    COAGS: No results found for: "INR", "PROTIME"   Non-Invasive Vascular Imaging:   CTA Abd/Pelvis:  5.9 cm infrarenal abdominal aortic aneurysm with extensive mural plaque. Aneurysm terminates at the aortic bifurcation.   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 There appears to be reconstitution of the right superficial femoral and deep femoral arteries proximally.   Diffuse  aortoiliac and branch vessel atherosclerosis.    ASSESSMENT/PLAN: This is a 77 y.o. male with 5.9cm infrarenal AAA   -The patient was admitted to  the hospital with a 2 wk history of back pain and incidental finding of PE and infrarenal AAA. The patient has an age indeterminate compression fracture of L1, and his back pain is likely musculoskeletal in nature due to a fall/overexertion -He was found to have a PE on admission.  He has no shortness of breath.  He was started on heparin for his PE -Initial x-ray by his PCP had an incidental AAA finding.  CT angiogram abdomen/pelvis confirmed a 5.9 cm infrarenal AAA.  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  There is reconstitution of the right superficial femoral and deep femoral arteries proximally -On exam the patient does not have a palpable right femoral pulse.  He does have a palpable left femoral pulse.  He also has palpable DP pulses.  He denies any pain in his legs or his feet.  He has intact motor and sensation of his bilateral lower extremities -The patient is asymptomatic for his AAA.  At this time the best course of treatment is to focus on his PE and GI issues.  His AAA can be fixed outpatient. -Dr. Randie Heinz to evaluate pt and determine further plan   [name], PA-C [facility] [phone]  I have independently interviewed and examined patient and agree with PA assessment and plan above.  He has felt well going through the meeting criteria for repair currently asymptomatic.  According to patient and his family approximately 10% risk of rupture within the next year.  He does have what appears to be new PE although asymptomatic without shortness of breath or chest or upper back pain.  He is currently on anticoagulation.  I have ordered venous duplex studies to evaluate for any lower extremity clot without any previous history of DVT.  If these are negative I would not recommend any filter placement given unknown time course of PE.  I have also ordered ABIs to provide baseline given occluded external  iliac artery.    I discussed with the patient and his family his options for aneurysm repair which would include open although would be high risk versus endovascular including AUI with femoral to femoral bypass versus right external iliac artery revascularization in standard.  Studies above are ordered and we will plan EVAR which could be performed as an outpatient with holding anticoagulation.  All questions answered.  Keyonda Bickle C. Randie Heinz, MD [facility] [place] Office: [phone] Pager: [phone]

## 2023-02-23 NOTE — Progress Notes (Signed)
Initial Nutrition Assessment  DOCUMENTATION CODES:   Severe malnutrition in context of chronic illness  INTERVENTION:  Liberalize to regular diet Change Glucerna shakes to Ensure Enlive po BID, each supplement provides 350 kcal and 20 grams of protein. MVI with minerals daily Try Boost Breeze po once daily, each supplement provides 250 kcal and 9 grams of protein Try 30 ml ProSource Plus once daily, each supplement provides 100 kcals and 15 grams protein.  "High Calorie, High Protein Nutrition Therapy" handout added to AVS  NUTRITION DIAGNOSIS:   Severe Malnutrition related to chronic illness as evidenced by severe fat depletion, moderate fat depletion, severe muscle depletion, energy intake < or equal to 75% for > or equal to 1 month.  GOAL:   Patient will meet greater than or equal to 90% of their needs  MONITOR:   PO intake, Supplement acceptance, Labs, Weight trends  REASON FOR ASSESSMENT:   Malnutrition Screening Tool    ASSESSMENT:   Pt admitted from home for evaluation of abdominal aortic aneurysm. PMH significant for HTN, HLD, T2DM, GERD, dementia.  Pt was seen by Vascular Surgery. Recommend OP fixation of AAA. Continue treatment of PE and GI issues.   Spoke with pt, wife and other family members at bedside who assisted to provide nutrition related history. They report that since July of last year, he has had immediate BM's following PO intake. Initially he was eating well but has since lost his appetite over the last few months. He has only been able to tolerate "munchies" chips and water but was eating "like a bird" per his wife.   Pt denies difficulty chewing/swallowing. Wife reports that his diarrhea has improved but endorses constipation now. Per nurse, pt did have a BM today. Family inquiring about having a colonoscopy as he was due for one outpatient. Encouraged him to discuss with MD.   They report that he had received a Glucerna shake at [facility] and this seemed  to upset his stomach.  Pt was NPO at time of visit and provider entered at that time as well. Unable to discuss further nutrition interventions at that time. Will change order to Ensure as these are higher calorie, higher protein than Glucerna supplements and adjust as PO intake improves or based on tolerance.   Pt reports a usual weight around 180 lbs. Since last year, he reports a weight loss of about 50 lbs but that he was able to gain 20 lbs back.   Reviewed weight history. Pt's weight noted to have been 62.6 kg (04/27/22). Current weight appears to be ~64.9 kg. This was last documented on 04/17, suspect current weight to be stated versus actual. Will order updated measured weight.   Medications: Vitamin B12, ferrous sulfate, SSI 0-9 units TID, magic mouthwash TID, protonix, IV heparin  Labs: CBG's 94-184 x24 hours, HgbA1c 5.8% (02/07)  NUTRITION - FOCUSED PHYSICAL EXAM:  Flowsheet Row Most Recent Value  Orbital Region Severe depletion  Upper Arm Region Moderate depletion  Thoracic and Lumbar Region Severe depletion  Buccal Region Moderate depletion  Temple Region Mild depletion  Clavicle Bone Region Mild depletion  Clavicle and Acromion Bone Region Mild depletion  Scapular Bone Region Mild depletion  Dorsal Hand Moderate depletion  Patellar Region Severe depletion  Anterior Thigh Region Severe depletion  Posterior Calf Region Severe depletion  Edema (RD Assessment) None  Hair Reviewed  Eyes Reviewed  Mouth Other (Comment)  [small white patchy coating on tongue]  Skin Reviewed  Nails Reviewed  Diet Order:   Diet Order             Diet Heart Room service appropriate? Yes; Fluid consistency: Thin  Diet effective now                   EDUCATION NEEDS:   No education needs have been identified at this time  Skin:  Skin Assessment: Reviewed RN Assessment  Last BM:  4/24 (type 4 small )  Height:   Ht Readings from Last 1 Encounters:  02/21/23  (1.702  m)    Weight:   Wt Readings from Last 1 Encounters:  02/21/23 64.9 kg   BMI:  Body mass index is 22.4 kg/m.  Estimated Nutritional Needs:   Kcal:  1800-2000  Protein:  90-105g  Fluid:  >/=1.8L  Drusilla Kanner, RDN, LDN Clinical Nutrition

## 2023-02-23 NOTE — Evaluation (Addendum)
Occupational Therapy Evaluation Patient Details Name: Walter [name] MRN: [id number] DOB: 1946-02-15 Today's Date: 02/23/2023   History of Present Illness 77 y.o. 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  x-ray showed an age-indeterminate L1 compression fracture. He has also not been able to tolerate most foods for several months. PMH: CAD, DMII, esophageal stricture, GERD, HTN, DDD, HLD, and dementia.   Clinical Impression   PTA pt lives at home with his wife and was modified independent with mobility and ADL tasks without the use of an AD until a few weeks ago. Since that time, Walter [name]'s PO intake has reduced significantly and he has required assistance with ADL tasks and began using a rollator. Pt states he had a fall from the bed, however also injured his back "pulling up his father-in-law". . Walter [name] demonstrates a functional decline due to below listed deficits, requiring min A with ADL and mobility @ RW level and will benefit from continued inpatient follow up therapy, <3 hours/day. Acute OT to follow.        Recommendations for follow up therapy are one component of a multi-disciplinary discharge planning process, led by the attending physician.  Recommendations may be updated based on patient status, additional functional criteria and insurance authorization.   Assistance Recommended at Discharge Frequent or constant Supervision/Assistance  Patient can return home with the following A little help with walking and/or transfers;A little help with bathing/dressing/bathroom;Assistance with cooking/housework;Direct supervision/assist for medications management;Direct supervision/assist for financial management;Assist for transportation;Help with stairs or ramp for entrance    Functional Status Assessment  Patient has had a recent decline in their functional status and  demonstrates the ability to make significant improvements in function in a reasonable and predictable amount of time.  Equipment Recommendations  Other (comment) (RW)    Recommendations for Other Services       Precautions / Restrictions Precautions Precautions: Fall Precaution Comments: shuffling gait from Parkinson's      Mobility Bed Mobility Overal bed mobility: Needs Assistance Bed Mobility: Rolling, Sidelying to Sit Rolling: Min guard Sidelying to sit: Min assist       General bed mobility comments: HOB increased; heavy use of rail    Transfers Overall transfer level: Needs assistance Equipment used: Rolling walker (2 wheels) Transfers: Sit to/from Stand Sit to Stand: Min guard                  Balance Overall balance assessment: History of Falls, Needs assistance Sitting-balance support: Feet supported, No upper extremity supported Sitting balance-Leahy Scale: Fair       Standing balance-Leahy Scale: Poor                             ADL either performed or assessed with clinical judgement   ADL Overall ADL's : Needs assistance/impaired Eating/Feeding: Set up Eating/Feeding Details (indicate cue type and reason): per family decreased PO intake; difficulty swallowing however not "choking" Grooming: Set up;Sitting   Upper Body Bathing: Set up;Supervision/ safety;Sitting   Lower Body Bathing: Minimal assistance;Sit to/from stand   Upper Body Dressing : Set up;Supervision/safety;Sitting   Lower Body Dressing: Minimal assistance;Sit to/from stand   Toilet Transfer: Minimal assistance;Ambulation;Rolling walker (2 wheels)   Toileting- Clothing Manipulation and Hygiene: Minimal assistance;Sit to/from stand       Functional mobility during  ADLs: Minimal assistance;Rolling walker (2 wheels);Cueing for safety       Vision Baseline Vision/History: 0 No visual deficits;1 Wears glasses (reading) Patient Visual Report: No change from  baseline       Perception     Praxis      Pertinent Vitals/Pain Pain Assessment Pain Assessment: 0-10 Pain Score: 7  Pain Location: low back Pain Descriptors / Indicators: Aching, Discomfort, Grimacing Pain Intervention(s): Limited activity within patient's tolerance (pain patch has been ordered)     Hand Dominance Left   Extremity/Trunk Assessment Upper Extremity Assessment Upper Extremity Assessment: Generalized weakness   Lower Extremity Assessment Lower Extremity Assessment: Defer to PT evaluation   Cervical / Trunk Assessment Cervical / Trunk Assessment: Other exceptions (L1 compression fx; forward head' anterior bias)   Communication Communication Communication: No difficulties   Cognition Arousal/Alertness: Awake/alert Behavior During Therapy: WFL for tasks assessed/performed Overall Cognitive Status: History of cognitive impairments - at baseline                                 General Comments: perfamily he is at his baseline     General Comments   Wife cares for her father and states "don is alone after 4 pm until I get home late at night"; expresses concern for his ability to care for himself    Exercises     Shoulder Instructions      Home Living Family/patient expects to be discharged to:: Private residence Living Arrangements: Spouse/significant other Available Help at Discharge: Family;Available PRN/intermittently Type of Home: House Home Access: Level entry (2STE with rails up to kitchen)           Bathroom Shower/Tub: Tub/shower unit;Walk-in shower   Bathroom Toilet: Standard Bathroom Accessibility: Yes (tub shower, not walk in) How Accessible: Accessible via walker Home Equipment: Rollator (4 wheels);BSC/3in1;Toilet riser          Prior Functioning/Environment Prior Level of Function : Independent/Modified Independent             Mobility Comments: was walking without AD until @ 3 wks ago; now using a  rollator ADLs Comments: was independent with self care until @ 3 wks ago - wife has been assisting        OT Problem List: Decreased strength;Decreased activity tolerance;Impaired balance (sitting and/or standing);Decreased safety awareness;Decreased knowledge of use of DME or AE;Pain      OT Treatment/Interventions: Self-care/ADL training;Therapeutic exercise;DME and/or AE instruction;Therapeutic activities;Patient/family education;Balance training    OT Goals(Current goals can be found in the care plan section) Acute Rehab OT Goals Patient Stated Goal: to get stronger OT Goal Formulation: With patient/family Time For Goal Achievement: 03/09/23 Potential to Achieve Goals: Good  OT Frequency: Min 2X/week    Co-evaluation              AM-PAC OT "6 Clicks" Daily Activity     Outcome Measure Help from another person eating meals?: A Little Help from another person taking care of personal grooming?: A Little Help from another person toileting, which includes using toliet, bedpan, or urinal?: A Little Help from another person bathing (including washing, rinsing, drying)?: A Little Help from another person to put on and taking off regular upper body clothing?: A Little Help from another person to put on and taking off regular lower body clothing?: A Little 6 Click Score: 18   End of Session Equipment Utilized During Treatment: Gait belt;Rolling walker (2 wheels)  Nurse Communication: Mobility status  Activity Tolerance: Patient tolerated treatment well Patient left: Other (comment) (walking with PT)  OT Visit Diagnosis: Unsteadiness on feet (R26.81);Other abnormalities of gait and mobility (R26.89);Muscle weakness (generalized) (M62.81);History of falling (Z91.81);Pain Pain - part of body:  (back)                Time: 1610-9604 OT Time Calculation (min): 31 min Charges:  OT General Charges $OT Visit: 1 Visit OT Evaluation $OT Eval Moderate Complexity: 1 Mod OT  Treatments $Self Care/Home Management : 8-22 mins  Luisa Dago, OT/L   Acute OT Clinical Specialist Acute Rehabilitation Services Pager [phone] Office [phone]   [facility] 02/23/2023, 2:07 PM

## 2023-02-23 NOTE — Plan of Care (Signed)
  Problem: Clinical Measurements: Goal: Respiratory complications will improve Outcome: Not Progressing   Problem: Clinical Measurements: Goal: Cardiovascular complication will be avoided Outcome: Not Progressing   Problem: Clinical Measurements: Goal: Diagnostic test results will improve Outcome: Not Progressing   Problem: Pain Managment: Goal: General experience of comfort will improve Outcome: Not Progressing

## 2023-02-23 NOTE — Discharge Instructions (Addendum)
INR  1.  No stooping, or bending or lifting weights above 10 pounds for 2 weeks.  2.  No driving for 2 weeks.  3.  Use walker to ambulate for 2 weeks.  4 call referring MDs office for results of the biopsies.  ------------------------------------------------------------------------------------------------------------------  Information on my medicine - ELIQUIS (apixaban)  This medication education was reviewed with me or my healthcare representative as part of my discharge preparation.   Why was Eliquis prescribed for you? Eliquis was prescribed to treat blood clots that may have been found in the veins of your legs (deep vein thrombosis) or in your lungs (pulmonary embolism) and to reduce the risk of them occurring again.  What do You need to know about Eliquis ? Start taking 5 mg TWICE daily. Eliquis may be taken with or without food.   Try to take the dose about the same time in the morning and in the evening. If you have difficulty swallowing the tablet whole please discuss with your pharmacist how to take the medication safely.  Take Eliquis exactly as prescribed and DO NOT stop taking Eliquis without talking to the doctor who prescribed the medication.  Stopping may increase your risk of developing a new blood clot.  Refill your prescription before you run out.  After discharge, you should have regular check-up appointments with your healthcare provider that is prescribing your Eliquis.    What do you do if you miss a dose? If a dose of ELIQUIS is not taken at the scheduled time, take it as soon as possible on the same day and twice-daily administration should be resumed. The dose should not be doubled to make up for a missed dose.  Important Safety Information A possible side effect of Eliquis is bleeding. You should call your healthcare provider right away if you experience any of the following: Bleeding from an injury or your nose that does not stop. Unusual  colored urine (red or dark brown) or unusual colored stools (red or black). Unusual bruising for unknown reasons. A serious fall or if you hit your head (even if there is no bleeding).  Some medicines may interact with Eliquis and might increase your risk of bleeding or clotting while on Eliquis. To help avoid this, consult your healthcare provider or pharmacist prior to using any new prescription or non-prescription medications, including herbals, vitamins, non-steroidal anti-inflammatory drugs (NSAIDs) and supplements.  This website has more information on Eliquis (apixaban): http://[email or web address].com/eliquis/home

## 2023-02-23 NOTE — Progress Notes (Signed)
PROGRESS NOTE    Walter [name]  UJW:[id number] DOB: Jul 19, 1946 DOA: 02/21/2023 PCP: Kristian Covey, MD   Brief Narrative:    Walter [name] is a 77 y.o. male with medical history significant of hypertension, hyperlipidemia, T2DM, GERD, dementia who presented to the emergency department on 4/22 for evaluation of abnormal aortic aneurysm that was seen on outpatient x-ray that was done for 2 weeks of lower back pain after a fall.  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  Images that extended into the lower chest noted small subsegmental incidental pulmonary embolus in right lower lobe.  Patient had initially presented to [facility] emergency room in [place] and after discussion with vascular surgery, transferred to [facility] on night of 4/23.  Vascular surgery formally consulted.  Assessment & Plan:   Principal Problem:   Abdominal aortic aneurysm Active Problems:   Essential hypertension   GERD (gastroesophageal reflux disease)   Acute pulmonary embolism   Closed compression fracture of L1 lumbar vertebra, initial encounter   Recurrent falls   Hypoalbuminemia due to protein-calorie malnutrition   Mixed hyperlipidemia   Prediabetes   Dementia without behavioral disturbance   Normocytic anemia   Protein-calorie malnutrition, severe  Assessment and Plan:   Abdominal aortic aneurysm enlarged, close to 6 cm.  Patient seen by vascular surgery APP with physician consultation pending.  Suspect he will likely need surgery, although it is not urgent and likely has been there for some time.  His more immediate issues are his pulmonary embolus.  Will clarify if IVC filter would be contraindicated.   Subsegmental pulmonary embolism Patient was started on IV heparin drip.  Suspect etiology is after lower back pain issues, patient reportedly almost nonmobile  for the past 2 weeks.  However prior to that, quite ambulatory.  Once we get clarification for plan for vascular surgery, likely transition to oral anticoagulation.  Acute anemia Patient noted to be on iron and B12 supplementation at home.  Noted MCV of 77, will check anemia panel Continue to monitor H/H carefully Status post 1 unit PRBC transfusion with no overt bleeding noted   Age-indeterminate L1 compression fracture in the setting of recurrent falls Continue fall precautions.  Given his previous active lifestyle, feel like he is good a good candidate for anticoagulation.  Seen by PT and OT who are recommending continued therapy.  Will check MRI of the lumbar spine to see if we can clarify acuteness of compression fracture and if intervention can be done at some point.  Cannot check MRI until changed from IV anticoagulation to p.o.  Trial of lidocaine patch   Severe protein calorie malnutrition: According to family, patient's p.o. intake has been declining over the past year.  Nutrition Status: Nutrition Problem: Severe Malnutrition Etiology: chronic illness Signs/Symptoms: severe fat depletion, moderate fat depletion, severe muscle depletion, energy intake < or equal to 75% for > or equal to 1 month Interventions: Ensure Enlive (each supplement provides 350kcal and 20 grams of protein), MVI, Liberalize Diet Appreciate nutrition help  Oral thrush: Magic mouthwash   Essential hypertension Continue amlodipine, losartan   Prediabetes Hemoglobin A1c on 12/08/2022 was 5.8 Continue diet and lifestyle modification at this time   Mixed hyperlipidemia Continue Lipitor   GERD Continue Protonix   Dementia Continue Aricept, Namenda   DVT prophylaxis: Heparin drip Code Status: DNR Family Communication: Wife and other  family members at bedside Disposition Plan:  Status is: Inpatient -Transition to DOAC -Clarification of plan by vascular surgery -MRI for lower back -Clarification of  disposition, skilled nursing versus home health   Consultants:  Vascular surgery-Dr. Randie Heinz  Procedures:  None  Antimicrobials:  None   Subjective: Patient complains of some lower back pain.  Objective: Vitals:   02/22/23 2027 02/23/23 0006 02/23/23 0334 02/23/23 0822  BP: (!) 160/69 (!) 144/66 (!) 153/70 (!) 159/68  Pulse: 66 62 68 68  Resp:    18  Temp: 98.2 F (36.8 C) 97.6 F (36.4 C)  97.9 F (36.6 C)  TempSrc: Oral Oral  Oral  SpO2: 98% 97% 96% 98%  Weight:      Height:        Intake/Output Summary (Last 24 hours) at 02/23/2023 1513 Last data filed at 02/23/2023 1300 Gross per 24 hour  Intake 724 ml  Output 700 ml  Net 24 ml   Filed Weights   02/21/23 1159  Weight: 64.9 kg    Examination:  General exam: Appears calm and comfortable, no acute distress, oriented x 2 HEENT: Mucous membranes are dry, some mild thrush patch on tongue Respiratory system: Clear to auscultation. Respiratory effort normal. Cardiovascular system: S1 & S2 heard, RRR.  Gastrointestinal system: Abdomen is soft, nontender, nondistended, positive bowel sounds Central nervous system: Focal deficits Extremities: No edema Skin: No significant lesions noted Psychiatry: Flat affect.  Appropriate    Data Reviewed: I have personally reviewed following labs and imaging studies Creatinine of 1.07.  Hemoglobin stable at 8.4 with MCV of 77.    Radiology Studies: CT Angio Abd/Pel W and/or Wo Contrast  Addendum Date: 02/21/2023   ADDENDUM REPORT: 02/21/2023 17:45 ADDENDUM: Within the images through the lower chest, there is a small subsegmental incidental pulmonary embolus noted in a right lower lobe anterolateral pulmonary arterial branch. Clinical significance is questionable due to its small size. These results were called by telephone at the time of interpretation on 02/21/2023 at 5:42 pm to provider Dr. Estell Harpin, who verbally acknowledged these results. Electronically Signed   By: Charlett Nose M.D.   On: 02/21/2023 17:45   Result Date: 02/21/2023 CLINICAL DATA:  back pain, aortic aneurysm, lumbar spine compression fracture. Fall. AAA seen on ultrasound. EXAM: CTA ABDOMEN AND PELVIS WITHOUT AND WITH CONTRAST TECHNIQUE: Multidetector CT imaging of the abdomen and pelvis was performed using the standard protocol during bolus administration of intravenous contrast. Multiplanar reconstructed images and MIPs were obtained and reviewed to evaluate the vascular anatomy. RADIATION DOSE REDUCTION: This exam was performed according to the departmental dose-optimization program which includes automated exposure control, adjustment of the mA and/or kV according to patient size and/or use of iterative reconstruction technique. CONTRAST:  80mL OMNIPAQUE IOHEXOL 350 MG/ML SOLN COMPARISON:  Ultrasound performed today. FINDINGS: VASCULAR Aorta: Heavily calcified aorta. Large infrarenal abdominal aortic aneurysm measuring up to 5.9 cm in maximum diameter. Extensive mural plaque. Aneurysm terminates at the aortic bifurcation. No dissection. Celiac: Heavily calcified. No aneurysm or dissection. No significant stenosis. SMA: Moderate calcifications. No aneurysm or dissection or stenosis. Renals: Moderate calcifications. No aneurysm, dissection or stenosis. IMA: Patent.  No aneurysm, dissection or stenosis. Inflow: Heavily calcified. No aneurysm or dissection. 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 Proximal Outflow: Right common femoral artery occluded. There may be some reconstitution of the proximal superficial femoral and deep femoral artery. Heavily calcified common femoral arteries bilaterally. Left common femoral, proximal superficial femoral and deep femoral arteries  are patent. Veins: No obvious venous abnormality within the limitations of this arterial phase study. Review of the MIP images confirms the above findings. NON-VASCULAR Lower chest: Linear areas of scarring.  No acute findings.  Hepatobiliary: No focal hepatic abnormality. Gallbladder unremarkable. Pancreas: No focal abnormality or ductal dilatation. Spleen: No focal abnormality.  Normal size. Adrenals/Urinary Tract: 1.9 cm cyst in the midpole of the right kidney appears benign. No follow-up imaging recommended. No hydronephrosis. Adrenal glands and urinary bladder unremarkable. Stomach/Bowel: Diffuse colonic diverticulosis. No active diverticulitis. Stomach and small bowel decompressed, unremarkable. Lymphatic: No adenopathy Reproductive: No visible focal abnormality. Other: No free fluid or free air. Small bilateral inguinal hernias containing fat. Musculoskeletal: Moderate L1 compression fracture as seen on plain films. IMPRESSION: VASCULAR 5.9 cm infrarenal abdominal aortic aneurysm with extensive mural plaque. Aneurysm terminates at the aortic bifurcation. 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 There appears to be reconstitution of the right superficial femoral and deep femoral arteries proximally. Diffuse aortoiliac and branch vessel atherosclerosis. NON-VASCULAR Colonic diverticulosis. No acute findings Age-indeterminate moderate L1 compression fracture Electronically Signed: By: Charlett Nose M.D. On: 02/21/2023 17:09        Scheduled Meds:  (feeding supplement) PROSource Plus  30 mL Oral Daily   sodium chloride   Intravenous Once   amLODipine  5 mg Oral Daily   atorvastatin  40 mg Oral Daily   cyanocobalamin  2,000 mcg Oral Daily   donepezil  10 mg Oral QHS   feeding supplement  1 Container Oral Q24H   feeding supplement  237 mL Oral BID BM   ferrous sulfate  325 mg Oral Q breakfast   insulin aspart  0-9 Units Subcutaneous TID WC   lidocaine  1 patch Transdermal Q24H   losartan  100 mg Oral Daily   magic mouthwash  5 mL Oral TID   memantine  10 mg Oral BID   multivitamin with minerals  1 tablet Oral Daily   pantoprazole  40 mg Oral Daily   Continuous Infusions:  heparin  850 Units/hr (02/23/23 0849)     LOS: 2 days       Hollice Espy, DO [facility]  If 7PM-7AM, please contact night-coverage [email or web address] 02/23/2023, 3:13 PM

## 2023-02-23 NOTE — Evaluation (Signed)
Physical Therapy Evaluation Patient Details Name: Walter [name] MRN: [id number] DOB: 08/05/1946 Today's Date: 02/23/2023  History of Present Illness  77 y.o. 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  x-ray showed an age-indeterminate L1 compression fracture. He has also not been able to tolerate most foods for several months. PMH: CAD, DMII, esophageal stricture, GERD, HTN, DDD, HLD, and dementia.  Clinical Impression   Pt admitted with above diagnosis. Lives at home with his wife (who is a caregiver for her father and is at father's place at night), in a single-level home with a few steps to enter; Prior to admission, pt was able to manage mobility overall independently until a few weeks ago; has been using a rollator for past few weeks leading up to this admission; Presents to PT with pain limiting activity tolerance, incr fall risk, and decr functional mobility; Needed min assist to get up to EOB, Min assist to stand and walked hallways with min assist and RW;  Pt currently with functional limitations due to the deficits listed below (see PT Problem List). Pt will benefit from skilled PT to increase their independence and safety with mobility to allow discharge to the venue listed below;  will benefit from continued inpatient follow up therapy, <3 hours/day.      Recommendations for follow up therapy are one component of a multi-disciplinary discharge planning process, led by the attending physician.  Recommendations may be updated based on patient status, additional functional criteria and insurance authorization.  Follow Up Recommendations Can patient physically be transported by private vehicle: Yes     Assistance Recommended at Discharge Frequent or constant Supervision/Assistance  Patient can return home with the following  A little help with walking and/or  transfers;Assistance with cooking/housework;Help with stairs or ramp for entrance    Equipment Recommendations Rolling walker (2 wheels);Other (comment) (Do not recommend pt use his rollator)  Recommendations for Other Services       Functional Status Assessment Patient has had a recent decline in their functional status and demonstrates the ability to make significant improvements in function in a reasonable and predictable amount of time.     Precautions / Restrictions Precautions Precautions: Fall Precaution Comments: shuffling gait form parkinson's      Mobility  Bed Mobility Overal bed mobility: Needs Assistance Bed Mobility: Rolling, Sidelying to Sit Rolling: Min guard Sidelying to sit: Min assist       General bed mobility comments: HOB increased; heavy use of rail    Transfers Overall transfer level: Needs assistance Equipment used: Rolling walker (2 wheels) Transfers: Sit to/from Stand Sit to Stand: Min guard           General transfer comment: Cues for hand placement; tends to pull up on RW    Ambulation/Gait Ambulation/Gait assistance: Min guard (with and without physical contact) Gait Distance (Feet): 120 Feet (Hallway amb) Assistive device: Rolling walker (2 wheels), Hemi-walker Gait Pattern/deviations: Trunk flexed, Festinating       General Gait Details: Tending to have a festinating gait pattern, which he was able to correct with multimodal cues for slow, long steps  Stairs            Wheelchair Mobility    Modified Rankin (Stroke Patients Only)       Balance     Sitting balance-Leahy Scale: Fair  Standing balance-Leahy Scale: Poor                               Pertinent Vitals/Pain Pain Assessment Pain Assessment: 0-10 Pain Score: 7  Pain Location: low back Pain Descriptors / Indicators: Aching, Discomfort, Grimacing Pain Intervention(s): Monitored during session    Home Living Family/patient  expects to be discharged to:: Private residence Living Arrangements: Spouse/significant other Available Help at Discharge: Family;Available PRN/intermittently Type of Home: House Home Access: Level entry (2STE with rails up to kitchen)         Home Equipment: Rollator (4 wheels);BSC/3in1;Toilet riser      Prior Function Prior Level of Function : Independent/Modified Independent             Mobility Comments: was walking without AD until @ 3 wks ago; now using a rollator ADLs Comments: was independent with self care until @ 3 wks ago - wife has been assisting     Hand Dominance   Dominant Hand: Left    Extremity/Trunk Assessment   Upper Extremity Assessment Upper Extremity Assessment: Defer to OT evaluation    Lower Extremity Assessment Lower Extremity Assessment: Generalized weakness    Cervical / Trunk Assessment Cervical / Trunk Assessment: Other exceptions (L1 compression fx; forward head' anterior bias)  Communication   Communication: No difficulties  Cognition Arousal/Alertness: Awake/alert Behavior During Therapy: WFL for tasks assessed/performed Overall Cognitive Status: History of cognitive impairments - at baseline                                 General Comments: perfamily he is at his baseline        General Comments General comments (skin integrity, edema, etc.): Family present and helpful; Walked on room air adn O2 sats 99%, HR 106    Exercises     Assessment/Plan    PT Assessment Patient needs continued PT services  PT Problem List Decreased strength;Decreased range of motion;Decreased activity tolerance;Decreased balance;Decreased mobility;Decreased coordination;Decreased cognition;Decreased knowledge of use of DME;Decreased safety awareness;Decreased knowledge of precautions;Pain       PT Treatment Interventions DME instruction;Gait training;Stair training;Functional mobility training;Therapeutic activities;Therapeutic  exercise;Balance training;Neuromuscular re-education;Cognitive remediation;Patient/family education    PT Goals (Current goals can be found in the Care Plan section)  Acute Rehab PT Goals Patient Stated Goal: less back pain PT Goal Formulation: With patient Time For Goal Achievement: 03/09/23 Potential to Achieve Goals: Good    Frequency Min 4X/week     Co-evaluation               AM-PAC PT "6 Clicks" Mobility  Outcome Measure Help needed turning from your back to your side while in a flat bed without using bedrails?: A Little           6 Click Score: 3    End of Session Equipment Utilized During Treatment: Gait belt Activity Tolerance: Patient tolerated treatment well Patient left: in chair;with call bell/phone within reach;with family/visitor present (and requesting no chair alarm) Nurse Communication: Mobility status PT Visit Diagnosis: Unsteadiness on feet (R26.81);Other abnormalities of gait and mobility (R26.89);History of falling (Z91.81);Pain Pain - Right/Left:  (center) Pain - part of body:  (low back)    Time: 1610-9604 PT Time Calculation (min) (ACUTE ONLY): 33 min   Charges:   PT Evaluation $PT Eval Moderate Complexity: 1 Mod PT Treatments $Gait Training: 8-22 mins  Van Clines, PT  Acute Rehabilitation Services Office [phone]   Walter [name] 02/23/2023, 4:10 PM

## 2023-02-23 NOTE — Progress Notes (Signed)
ANTICOAGULATION CONSULT NOTE - Follow-up Consult  Pharmacy Consult for Heparin Indication:  mural thrombus  No Known Allergies  Patient Measurements: Height:  (170.2 cm) Weight: 64.9 kg (143 lb) IBW/kg (Calculated) : 66.1 Heparin Dosing Weight: HEPARIN DW (KG): 64.9   Vital Signs: Temp: 97.9 F (36.6 C) (04/24 0822) Temp Source: Oral (04/24 0822) BP: 159/68 (04/24 0822) Pulse Rate: 68 (04/24 0822)  Labs: Recent Labs    02/21/23 1256 02/22/23 0343 02/22/23 0502 02/22/23 1141 02/22/23 1339 02/23/23 0627  HGB 7.6* 6.9* 6.7* 8.6*  --  8.4*  HCT 27.2* 24.3* 24.0* 29.2*  --  27.8*  PLT 286 265  --   --   --  282  HEPARINUNFRC  --  0.59  --   --  0.69 0.70  CREATININE 1.38* 1.05  --   --   --  1.07     Estimated Creatinine Clearance: 53.1 mL/min (by C-G formula based on SCr of 1.07 mg/dL).   Medical History: Past Medical History:  Diagnosis Date   Allergy    Cancer    Skin cancer   Cataract    Coronary artery disease    Diabetes mellitus    Diverticulosis    Esophageal stricture    GERD (gastroesophageal reflux disease)    Hyperlipidemia    Hypertension    Peripheral vascular disease     Medications:  Medications Prior to Admission  Medication Sig Dispense Refill Last Dose   amLODipine (NORVASC) 5 MG tablet Take 1 tablet by mouth once daily (Patient taking differently: Take 5 mg by mouth daily.) 90 tablet 0 02/21/2023   atorvastatin (LIPITOR) 40 MG tablet Take 1 tablet by mouth once daily (Patient taking differently: Take 40 mg by mouth daily.) 90 tablet 1 02/21/2023   cyanocobalamin 2000 MCG tablet Take 1 tablet (2,000 mcg total) by mouth daily. 30 tablet 0 02/21/2023   Dihydroxyaluminum Sod Carb (ROLAIDS PO) Take 1 tablet by mouth as needed (heartburn).   UNK   donepezil (ARICEPT) 10 MG tablet Take 1 tablet daily (Patient taking differently: Take 10 mg by mouth daily.) 90 tablet 3 02/21/2023   Esomeprazole Magnesium (NEXIUM PO) Take 1 tablet by mouth  daily as needed (acid reflux).   UNK   Iron, Ferrous Sulfate, 325 (65 Fe) MG TABS Take 325 mg by mouth daily. 30 tablet 3 02/21/2023   losartan (COZAAR) 100 MG tablet Take 1 tablet by mouth once daily 90 tablet 0 02/21/2023   memantine (NAMENDA) 10 MG tablet Take 1 tablet twice a day (Patient taking differently: Take 10 mg by mouth 2 (two) times daily.) 180 tablet 3 02/21/2023   metFORMIN (GLUCOPHAGE) 1000 MG tablet Take 1 tablet (1,000 mg total) by mouth 2 (two) times daily with a meal. 180 tablet 0 02/21/2023   OVER THE COUNTER MEDICATION Take 1 tablet by mouth daily. Allergy relief   02/21/2023   tiZANidine (ZANAFLEX) 4 MG tablet Take 0.5-1 tablets (2-4 mg total) by mouth at bedtime as needed for muscle spasms. 30 tablet 0 Past Week   Blood Glucose Monitoring Suppl (ONE TOUCH ULTRA 2) w/Device KIT 1 kit by Does not apply route daily. 1 kit 0    glucose blood (ONETOUCH ULTRA) test strip USE 1 STRIP TO CHECK GLUCOSE ONCE DAILY 100 each 0    Lancets (ONETOUCH ULTRASOFT) lancets Check blood sugars once daily. Dx: E11.51 100 each 12     Medication History pending.  No anticoagulation PTA.  Assessment: 77 yo M with ongoing  pain s/p fall ~ 2 weeks.  Had additional imaging 4/22 which showed aneurysm for which pt was sent to the ED for further imaging.  [place] and CT showed 6 cm infrarenal abdominal aortic aneurysm is noted with a large amount of mural thrombus. Pharmacy has been consulted to dose heparin.  Noted anemia, today with hgb 8.4. Heparin level at upper end of goal at 0.7. No bleeding or IV issues noted.   Goal of Therapy:  Heparin level 0.3-0.7 units/ml Monitor platelets by anticoagulation protocol: Yes  Plan:  Decrease heparin to 850 units/hr to keep within goal Monitor CBC closely due to patients anemia Daily heparin level while on heparin drip Continue to monitor for signs/symptoms of bleeding  Jeanella Cara, PharmD, [facility] Clinical Pharmacist Please see AMION for all Pharmacists' Contact  Phone Numbers 02/23/2023, 8:39 AM

## 2023-02-24 ENCOUNTER — Inpatient Hospital Stay (HOSPITAL_COMMUNITY): Payer: Medicare HMO

## 2023-02-24 DIAGNOSIS — I7090 Unspecified atherosclerosis: Secondary | ICD-10-CM

## 2023-02-24 DIAGNOSIS — S32010A Wedge compression fracture of first lumbar vertebra, initial encounter for closed fracture: Secondary | ICD-10-CM | POA: Diagnosis not present

## 2023-02-24 DIAGNOSIS — I2699 Other pulmonary embolism without acute cor pulmonale: Secondary | ICD-10-CM | POA: Diagnosis not present

## 2023-02-24 DIAGNOSIS — I714 Abdominal aortic aneurysm, without rupture, unspecified: Secondary | ICD-10-CM | POA: Diagnosis not present

## 2023-02-24 DIAGNOSIS — F0390 Unspecified dementia without behavioral disturbance: Secondary | ICD-10-CM | POA: Diagnosis not present

## 2023-02-24 DIAGNOSIS — Z86711 Personal history of pulmonary embolism: Secondary | ICD-10-CM

## 2023-02-24 LAB — CBC
HCT: 28.3 % — ABNORMAL LOW (ref 39.0–52.0)
Hemoglobin: 8.4 g/dL — ABNORMAL LOW (ref 13.0–17.0)
MCH: 23.7 pg — ABNORMAL LOW (ref 26.0–34.0)
MCHC: 29.7 g/dL — ABNORMAL LOW (ref 30.0–36.0)
MCV: 79.9 fL — ABNORMAL LOW (ref 80.0–100.0)
Platelets: 276 10*3/uL (ref 150–400)
RBC: 3.54 MIL/uL — ABNORMAL LOW (ref 4.22–5.81)
RDW: 18.1 % — ABNORMAL HIGH (ref 11.5–15.5)
WBC: 8.5 10*3/uL (ref 4.0–10.5)
nRBC: 0 % (ref 0.0–0.2)

## 2023-02-24 LAB — BASIC METABOLIC PANEL
Anion gap: 10 (ref 5–15)
BUN: 20 mg/dL (ref 8–23)
CO2: 23 mmol/L (ref 22–32)
Calcium: 8.5 mg/dL — ABNORMAL LOW (ref 8.9–10.3)
Chloride: 102 mmol/L (ref 98–111)
Creatinine, Ser: 1.29 mg/dL — ABNORMAL HIGH (ref 0.61–1.24)
GFR, Estimated: 57 mL/min — ABNORMAL LOW (ref >60)
Glucose, Bld: 109 mg/dL — ABNORMAL HIGH (ref 70–99)
Potassium: 3.9 mmol/L (ref 3.5–5.1)
Sodium: 135 mmol/L (ref 135–145)

## 2023-02-24 LAB — GLUCOSE, CAPILLARY
Glucose-Capillary: 117 mg/dL — ABNORMAL HIGH (ref 70–99)
Glucose-Capillary: 226 mg/dL — ABNORMAL HIGH (ref 70–99)
Glucose-Capillary: 209 mg/dL — ABNORMAL HIGH (ref 70–99)
Glucose-Capillary: 188 mg/dL — ABNORMAL HIGH (ref 70–99)
Glucose-Capillary: 124 mg/dL — ABNORMAL HIGH (ref 70–99)

## 2023-02-24 LAB — HEPARIN LEVEL (UNFRACTIONATED): Heparin Unfractionated: 0.5 IU/mL (ref 0.30–0.70)

## 2023-02-24 MED ORDER — RIVAROXABAN 20 MG PO TABS: 20.0000 mg | ORAL_TABLET | Freq: Every day | ORAL | Status: DC

## 2023-02-24 MED ORDER — RIVAROXABAN 15 MG PO TABS
15.0000 mg | ORAL_TABLET | Freq: Two times a day (BID) | ORAL | Status: DC
  Administered 2023-02-24 – 2023-02-28 (×8): 15 mg via ORAL
  Filled 2023-02-24 (×8): qty 1

## 2023-02-24 MED ORDER — KETOROLAC TROMETHAMINE 15 MG/ML IJ SOLN
15.0000 mg | Freq: Once | INTRAMUSCULAR | Status: AC
  Administered 2023-02-24: 15 mg via INTRAVENOUS
  Filled 2023-02-24: qty 1

## 2023-02-24 NOTE — Progress Notes (Signed)
PROGRESS NOTE    Walter [name]  ZOX:[id number] DOB: April 11, 1946 DOA: 02/21/2023 PCP: Kristian Covey, MD   Brief Narrative:    Walter [name] is a 77 y.o. male with medical history significant of hypertension, hyperlipidemia, T2DM, GERD, dementia who presented to the emergency department on 4/22 for evaluation of abnormal aortic aneurysm that was seen on outpatient x-ray that was done for 2 weeks of lower back pain after a fall.  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  Images that extended into the lower chest noted small subsegmental incidental pulmonary embolus in right lower lobe.  Patient had initially presented to [facility] emergency room in [place] and after discussion with vascular surgery, transferred to [facility] on night of 4/23.  Vascular surgery formally consulted.  Assessment & Plan:   Principal Problem:   Abdominal aortic aneurysm Active Problems:   Essential hypertension   GERD (gastroesophageal reflux disease)   Acute pulmonary embolism   Closed compression fracture of L1 lumbar vertebra, initial encounter   Recurrent falls   Hypoalbuminemia due to protein-calorie malnutrition   Mixed hyperlipidemia   Prediabetes   Dementia without behavioral disturbance   Normocytic anemia   Protein-calorie malnutrition, severe  Assessment and Plan:   Abdominal aortic aneurysm enlarged, close to 6 cm.  Patient seen by vascular surgery who recommended no immediate intervention.  Suspect he will likely need surgery, although it is not urgent and likely has been there for some time.  His more immediate issues are his pulmonary embolus.  If the lower extremity Dopplers, no need for IVC filter.  Can change over to oral anticoagulation only   Subsegmental pulmonary embolism Patient was started on IV heparin drip.  Suspect etiology is after lower back  pain issues, patient reportedly almost nonmobile for the past 2 weeks.  However prior to that, quite ambulatory.  Awaiting lower extremity Dopplers and likely will change over to oral anticoagulation later today  Acute anemia Patient noted to be on iron and B12 supplementation at home.  Noted MCV of 77, and iron panel with normal TIBC, but low iron and ferritin, from likely some component of chronic disease.  Will add iron supplement.  Hemoglobin has remained stable Status post 1 unit PRBC transfusion with no overt bleeding noted   Age-indeterminate L1 compression fracture in the setting of recurrent falls Continue fall precautions.  Given his previous active lifestyle, feel like he is good a good candidate for anticoagulation.  Seen by PT and OT who are skilled nursing will check MRI of the lumbar spine tomorrow.  Lidocaine patch has improved patient's back pain   Severe protein calorie malnutrition: According to family, patient's p.o. intake has been declining over the past year.  Nutrition Status: Nutrition Problem: Severe Malnutrition Etiology: chronic illness Signs/Symptoms: severe fat depletion, moderate fat depletion, severe muscle depletion, energy intake < or equal to 75% for > or equal to 1 month Interventions: Ensure Enlive (each supplement provides 350kcal and 20 grams of protein), MVI, Liberalize Diet Appreciate nutrition help  Oral thrush: Magic mouthwash   Essential hypertension Continue amlodipine, losartan   Prediabetes Hemoglobin A1c on 12/08/2022 was 5.8 Continue diet and lifestyle modification at this time   Mixed hyperlipidemia Continue Lipitor   GERD Continue Protonix   Dementia Continue Aricept, Namenda   DVT prophylaxis: Heparin drip Code Status: DNR Family Communication: Wife at  bedside Disposition Plan:  Status is: Inpatient -Transition to DOAC -Review of lower extremity Dopplers -MRI for lower back -Needs skilled nursing   Consultants:   Vascular surgery-Dr. Randie Heinz  Procedures:  1 unit packed red blood cell transfusion Lower extremity Dopplers with results pending  Antimicrobials:  None   Subjective: Patient states back pain improved  Objective: Vitals:   02/23/23 1701 02/23/23 2016 02/24/23 0330 02/24/23 0801  BP: 134/66 (!) 158/77 (!) 147/69 (!) 145/68  Pulse: 71 84 69 69  Resp: Temp: 98.2 F (36.8 C) 98 F (36.7 C) 97.7 F (36.5 C) 97.8 F (36.6 C)  TempSrc: Oral Oral Oral Oral  SpO2: 100% 99% 98% 96%  Weight:      Height:        Intake/Output Summary (Last 24 hours) at 02/24/2023 1347 Last data filed at 02/24/2023 0801 Gross per 24 hour  Intake 475 ml  Output 1100 ml  Net -625 ml    Filed Weights   02/21/23 1159  Weight: 64.9 kg    Examination:  General exam: Appears calm and comfortable, no acute distress, oriented x 2 HEENT: Mucous membranes are dry, some mild thrush patch on tongue Respiratory system: Clear to auscultation. Respiratory effort normal. Cardiovascular system: S1 & S2 heard, RRR.  Gastrointestinal system: Abdomen is soft, nontender, nondistended, positive bowel sounds Central nervous system: Focal deficits Extremities: No edema Skin: No significant lesions noted Psychiatry: Flat affect.  Appropriate    Data Reviewed: I have personally reviewed following labs and imaging studies Creatinine slightly increased to 1.29.  Anemia panel notes low iron and ferritin.    Radiology Studies: No results found.      Scheduled Meds:  (feeding supplement) PROSource Plus  30 mL Oral Daily   sodium chloride   Intravenous Once   amLODipine  5 mg Oral Daily   atorvastatin  40 mg Oral Daily   cyanocobalamin  2,000 mcg Oral Daily   donepezil  10 mg Oral QHS   feeding supplement  1 Container Oral Q24H   feeding supplement  237 mL Oral BID BM   ferrous sulfate  325 mg Oral Q breakfast   insulin aspart  0-9 Units Subcutaneous TID WC   lidocaine  1 patch Transdermal  Q24H   losartan  100 mg Oral Daily   magic mouthwash  5 mL Oral TID   memantine  10 mg Oral BID   multivitamin with minerals  1 tablet Oral Daily   pantoprazole  40 mg Oral Daily   Continuous Infusions:  heparin 850 Units/hr (02/23/23 2225)     LOS: 3 days       Hollice Espy, DO [facility]  If 7PM-7AM, please contact night-coverage [email or web address] 02/24/2023, 1:47 PM

## 2023-02-24 NOTE — Progress Notes (Signed)
   02/24/23 1100  Mobility  Activity Dangled on edge of bed;Ambulated with assistance in hallway  Level of Assistance Contact guard assist, steadying assist  Assistive Device Front wheel walker  Distance Ambulated (ft) 70 ft  Activity Response Tolerated well  Mobility Referral Yes  $Mobility charge 1 Mobility   Mobility Specialist Progress Note  Pt was in chair and agreeable. Had trouble w/ keeping posture straight. Returned to chair w/ all needs met and call bell in reach.   Anastasia Pall Mobility Specialist  Please contact via SecureChat or Rehab office at [phone]

## 2023-02-24 NOTE — Progress Notes (Signed)
VASCULAR LAB    Bilateral lower extremity venous duplex has been performed.  See CV proc for preliminary results.   Mardelle Pandolfi, RVT 02/24/2023, 2:01 PM

## 2023-02-24 NOTE — Progress Notes (Addendum)
  Progress Note    02/24/2023 8:44 AM * No surgery found *  Subjective:  having minimal back pain this morning, Tylenol helps  Vitals:   02/24/23 0330 02/24/23 0801  BP: (!) 147/69 (!) 145/68  Pulse: 69 69  Resp: 17 15  Temp: 97.7 F (36.5 C) 97.8 F (36.6 C)  SpO2: 98% 96%    Physical Exam: General: No acute distress Cardiac: Regular Lungs: Nonlabored Extremities: Palpable DP pulses Abdomen: Soft, nontender, nondistended  CBC    Component Value Date/Time   WBC 8.5 02/24/2023 0709   RBC 3.54 (L) 02/24/2023 0709   HGB 8.4 (L) 02/24/2023 0709   HCT 28.3 (L) 02/24/2023 0709   PLT 276 02/24/2023 0709   MCV 79.9 (L) 02/24/2023 0709   MCH 23.7 (L) 02/24/2023 0709   MCHC 29.7 (L) 02/24/2023 0709   RDW 18.1 (H) 02/24/2023 0709   LYMPHSABS 1.0 04/19/2019 0956   MONOABS 0.4 04/19/2019 0956   EOSABS 0.2 04/19/2019 0956   BASOSABS 0.0 04/19/2019 0956    BMET    Component Value Date/Time   NA 135 02/24/2023 0709   K 3.9 02/24/2023 0709   CL 102 02/24/2023 0709   CO2 23 02/24/2023 0709   GLUCOSE 109 (H) 02/24/2023 0709   GLUCOSE 127 (H) 08/17/2006 1059   BUN 20 02/24/2023 0709   CREATININE 1.29 (H) 02/24/2023 0709   CALCIUM 8.5 (L) 02/24/2023 0709   GFRNONAA 57 (L) 02/24/2023 0709   GFRAA >60 01/07/2019 0751    INR No results found for: "INR"   Intake/Output Summary (Last 24 hours) at 02/24/2023 0844 Last data filed at 02/24/2023 0801 Gross per 24 hour  Intake 475 ml  Output 1300 ml  Net -825 ml      Assessment/Plan:  77 y.o. male with 6 cm AAA and PE   -No changes overnight.  Having minimal back pain this morning, mostly controlled with Tylenol -No abdominal pain.  Says he is tolerating his food the past 2 days -Still on heparin for PE.  Pending ABIs and lower extremity DVT study.  If lower extremity DVT study is negative, would not recommend IVC filter placement -At this time the patient does not require immediate surgical repair of AAA.  Hopeful to  plan EVAR in the outpatient setting   Bobbie Stack [facility] [phone] 02/24/2023 8:44 AM   I have independently interviewed and examined patient and agree with PA assessment and plan above.  Plan is for possible rehab and I will have him follow-up in 4 weeks to discuss proceeding with endovascular aneurysm repair with right external iliac artery revascularization versus AUI.  Plan discussed with patient's wife at the bedside as well as primary team.  Apolinar Junes C. Randie Heinz, MD [facility] [place] Office: [phone] Pager: [phone]

## 2023-02-24 NOTE — Care Management Important Message (Signed)
Important Message  Patient Details  Name: Olumide Dolinger MRN: [id number] Date of Birth: 09/25/1946   Medicare Important Message Given:  Yes     Sherilyn Banker 02/24/2023, 12:05 PM

## 2023-02-24 NOTE — Progress Notes (Signed)
VASCULAR LAB    ABI has been performed.  See CV proc for preliminary results.   Edilberto Roosevelt, RVT 02/24/2023, 1:57 PM

## 2023-02-24 NOTE — Progress Notes (Signed)
Physical Therapy Treatment Patient Details Name: Walter [name] MRN: [id number] DOB: September 27, 1946 Today's Date: 02/24/2023   History of Present Illness 77 y.o. 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  x-ray showed an age-indeterminate L1 compression fracture. He has also not been able to tolerate most foods for several months. PMH: CAD, DMII, esophageal stricture, GERD, HTN, DDD, HLD, and dementia.    PT Comments    Pt was received in supine and agreeable to session. Pt able to perform bed mobility with min guard with pt asking for HHA, but was able to use bedrail instead with cues. Focus on large steps during gait trial with pt able to briefly correct. Once returned to supine pt with episode of emesis, RN notified and present at end of session. Pt reporting nausea was sudden once he returned to bed. Pt continues to benefit from PT services to progress toward functional mobility goals.    Recommendations for follow up therapy are one component of a multi-disciplinary discharge planning process, led by the attending physician.  Recommendations may be updated based on patient status, additional functional criteria and insurance authorization.     Assistance Recommended at Discharge Frequent or constant Supervision/Assistance  Patient can return home with the following A little help with walking and/or transfers;Assistance with cooking/housework;Help with stairs or ramp for entrance   Equipment Recommendations  Rolling walker (2 wheels);Other (comment)    Recommendations for Other Services       Precautions / Restrictions Precautions Precautions: Fall Precaution Comments: shuffling gait form parkinson's Restrictions Weight Bearing Restrictions: No     Mobility  Bed Mobility Overal bed mobility: Needs Assistance Bed Mobility: Supine to Sit, Sit to Supine     Supine to  sit: Min guard, HOB elevated Sit to supine: Min guard, HOB elevated   General bed mobility comments: increased time and use of bedrail. Cues for technique    Transfers Overall transfer level: Needs assistance Equipment used: Rolling walker (2 wheels) Transfers: Sit to/from Stand Sit to Stand: Min guard           General transfer comment: Cues for hand placement    Ambulation/Gait Ambulation/Gait assistance: Min guard Gait Distance (Feet): 100 Feet Assistive device: Rolling walker (2 wheels) Gait Pattern/deviations: Trunk flexed, Festinating       General Gait Details: Cues for large steps and pt able to briefly correct.       Balance Overall balance assessment: History of Falls, Needs assistance Sitting-balance support: Feet supported, No upper extremity supported Sitting balance-Leahy Scale: Fair Sitting balance - Comments: sitting EOB. Pt with posterior lean, but able to correct with cues   Standing balance support: Bilateral upper extremity supported, During functional activity Standing balance-Leahy Scale: Poor Standing balance comment: with RW support                            Cognition Arousal/Alertness: Awake/alert Behavior During Therapy: WFL for tasks assessed/performed Overall Cognitive Status: History of cognitive impairments - at baseline                                 General Comments: perfamily he is at his baseline        Exercises      General Comments  Pertinent Vitals/Pain Pain Assessment Pain Assessment: 0-10 Pain Score: 5  Pain Location: low back Pain Descriptors / Indicators: Aching, Discomfort, Grimacing Pain Intervention(s): Monitored during session, Repositioned     PT Goals (current goals can now be found in the care plan section) Acute Rehab PT Goals Patient Stated Goal: less back pain PT Goal Formulation: With patient Time For Goal Achievement: 03/09/23 Potential to Achieve Goals:  Good Progress towards PT goals: Progressing toward goals    Frequency    Min 4X/week      PT Plan Current plan remains appropriate       AM-PAC PT "6 Clicks" Mobility   Outcome Measure  Help needed turning from your back to your side while in a flat bed without using bedrails?: A Little Help needed moving from lying on your back to sitting on the side of a flat bed without using bedrails?: A Little Help needed moving to and from a bed to a chair (including a wheelchair)?: A Little Help needed standing up from a chair using your arms (e.g., wheelchair or bedside chair)?: A Little Help needed to walk in hospital room?: A Little Help needed climbing 3-5 steps with a railing? : A Lot 6 Click Score: 17    End of Session Equipment Utilized During Treatment: Gait belt Activity Tolerance: Patient tolerated treatment well Patient left: in bed;with bed alarm set;with nursing/sitter in room;with call bell/phone within reach Nurse Communication: Mobility status PT Visit Diagnosis: Unsteadiness on feet (R26.81);Other abnormalities of gait and mobility (R26.89);History of falling (Z91.81);Pain     Time: 4782-9562 PT Time Calculation (min) (ACUTE ONLY): 23 min  Charges:  $Gait Training: 23-37 mins                     Johny Shock, PTA Acute Rehabilitation Services Secure Chat Preferred  [phone]    Johny Shock 02/24/2023, 4:50 PM

## 2023-02-24 NOTE — TOC Initial Note (Signed)
Transition of Care [facility]) - Initial/Assessment Note    Patient Details  Name: Walter [name] MRN: [id number] Date of Birth: [birth year]  [facility] CM/SW Contact:    Carley Hammed, LCSW Phone Number: 02/24/2023, 12:37 PM  Clinical Narrative:                 CSW met with pt and spouse at bedside to discuss SNF recommendation. Pt's spouse states that she currently cares for her elderly father as well, and cannot manage both of them requiring help. Pt notes he is agreeable to SNF as well. Process discussed and spouse states she prefers a facility in [place]. Pt and spouse notified of limited in network options with [employer]. Spouse agreeable to [facility]AR transport at DC. CSW to complete workup and faxout. TOC will continue to follow for DC needs.   Expected Discharge Plan: Skilled Nursing Facility Barriers to Discharge: Continued Medical Work up, SNF Pending bed offer, Insurance Authorization   Patient Goals and CMS Choice Patient states their goals for this hospitalization and ongoing recovery are:: Pt and spouse want him to return home more independently. CMS Medicare.gov Compare Post Acute Care list provided to:: Patient Choice offered to / list presented to : Patient, Spouse [place] ownership interest in [facility].provided to:: Patient    Expected Discharge Plan and Services     Post Acute Care Choice: Skilled Nursing Facility Living arrangements for the past 2 months: Single Family Home                                      Prior Living Arrangements/Services Living arrangements for the past 2 months: Single Family Home Lives with:: Spouse Patient language and need for interpreter reviewed:: Yes Do you feel safe going back to the place where you live?: Yes      Need for Family Participation in Patient Care: Yes (Comment) Care giver support system in place?: Yes (comment)   Criminal Activity/Legal Involvement Pertinent to Current  Situation/Hospitalization: No - Comment as needed  Activities of Daily Living Home Assistive Devices/Equipment: None ADL Screening (condition at time of admission) Patient's cognitive ability adequate to safely complete daily activities?: Yes Is the patient deaf or have difficulty hearing?: No Does the patient have difficulty seeing, even when wearing glasses/contacts?: No Does the patient have difficulty concentrating, remembering, or making decisions?: No Patient able to express need for assistance with ADLs?: Yes Does the patient have difficulty dressing or bathing?: No Independently performs ADLs?: Yes (appropriate for developmental age) Does the patient have difficulty walking or climbing stairs?: No Weakness of Legs: Right Weakness of Arms/Hands: Left  Permission Sought/Granted Permission sought to share information with : Family Supports Permission granted to share information with : Yes, Verbal Permission Granted  Share Information with NAME: Fausto Sampedro     Permission granted to share info w Relationship: Spouse     Emotional Assessment Appearance:: Appears stated age Attitude/Demeanor/Rapport: Engaged Affect (typically observed): Appropriate Orientation: : Oriented to Self, Oriented to Place, Oriented to  Time, Oriented to Situation Alcohol / Substance Use: Not Applicable Psych Involvement: No (comment)  Admission diagnosis:  Abdominal aortic aneurysm [I71.40] Patient Active Problem List   Diagnosis Date Noted   Protein-calorie malnutrition, severe 02/23/2023   Abdominal aortic aneurysm 02/21/2023   Acute pulmonary embolism 02/21/2023   Closed compression fracture of L1 lumbar vertebra, initial encounter 02/21/2023   Recurrent  falls 02/21/2023   Hypoalbuminemia due to protein-calorie malnutrition 02/21/2023   Mixed hyperlipidemia 02/21/2023   Prediabetes 02/21/2023   Dementia without behavioral disturbance 02/21/2023   Normocytic anemia 02/21/2023    Parkinson's disease 03/18/2020   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   Generalized weakness    Hyperglycemia    Stricture and stenosis of esophagus 06/07/2012   B12 deficiency 06/07/2012   GERD (gastroesophageal reflux disease) 05/07/2009   Peripheral vascular disease 03/13/2008   Diabetes mellitus with peripheral circulatory disorder 05/24/2007   Essential hypertension 05/24/2007   Coronary atherosclerosis 05/24/2007   PCP:  Kristian Covey, MD Pharmacy:   [facility] [address] [place] - [address] [place] [address] Phone: [phone] Fax: [phone]     Social Determinants of Health (SDOH) Social History: SDOH Screenings   Food Insecurity: Patient Declined (02/22/2023)  Housing: Low Risk  (02/22/2023)  Transportation Needs: Patient Declined (02/22/2023)  Utilities: Patient Declined (02/22/2023)  Alcohol Screen: Low Risk  (04/23/2022)  Depression (PHQ2-9): High Risk (02/16/2023)  Financial Resource Strain: Low Risk  (04/23/2022)  Physical Activity: Inactive (04/23/2022)  Social Connections: Socially Integrated (04/23/2022)  Stress: No Stress Concern Present (04/23/2022)  Tobacco Use: Medium Risk (02/21/2023)   SDOH Interventions: Food Insecurity Interventions: Patient Refused Utilities Interventions: Patient Refused   Readmission Risk Interventions     No data to display

## 2023-02-24 NOTE — [place] FL2 (Signed)
[place] MEDICAID FL2 LEVEL OF CARE FORM     IDENTIFICATION  Patient Name: Walter [name] Birthdate: 29-Aug-1946 Sex: male Admission Date (Current Location): 02/21/2023  [facility] and [place] Number:  Producer, television/film/video and Address:  [facility], [address], [place], [place] [address]      Provider Number: [id number]  Attending Physician Name and Address:  Hollice Espy, MD  Relative Name and Phone Number:  Halo Laski  [phone]    Current Level of Care: Hospital Recommended Level of Care: Skilled Nursing Facility Prior Approval Number:    Date Approved/Denied:   PASRR Number: [id number]  Discharge Plan: SNF    Current Diagnoses: Patient Active Problem List   Diagnosis Date Noted   Protein-calorie malnutrition, severe 02/23/2023   Abdominal aortic aneurysm 02/21/2023   Acute pulmonary embolism 02/21/2023   Closed compression fracture of L1 lumbar vertebra, initial encounter 02/21/2023   Recurrent falls 02/21/2023   Hypoalbuminemia due to protein-calorie malnutrition 02/21/2023   Mixed hyperlipidemia 02/21/2023   Prediabetes 02/21/2023   Dementia without behavioral disturbance 02/21/2023   Normocytic anemia 02/21/2023   Parkinson's disease 03/18/2020   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   Generalized weakness    Hyperglycemia    Stricture and stenosis of esophagus 06/07/2012   B12 deficiency 06/07/2012   GERD (gastroesophageal reflux disease) 05/07/2009   Peripheral vascular disease 03/13/2008   Diabetes mellitus with peripheral circulatory disorder 05/24/2007   Essential hypertension 05/24/2007   Coronary atherosclerosis 05/24/2007    Orientation RESPIRATION BLADDER Height & Weight     Self, Time, Situation, Place  Normal Continent Weight: 143 lb (64.9 kg) Height:   (170.2 cm)  BEHAVIORAL SYMPTOMS/MOOD NEUROLOGICAL BOWEL NUTRITION STATUS      Continent Diet (See DC summary)  AMBULATORY STATUS  COMMUNICATION OF NEEDS Skin   Limited Assist Verbally Normal                       Personal Care Assistance Level of Assistance  Bathing, Feeding, Dressing Bathing Assistance: Limited assistance Feeding assistance: Independent Dressing Assistance: Limited assistance     Functional Limitations Info  Sight, Hearing, Speech Sight Info: Adequate Hearing Info: Adequate Speech Info: Adequate    SPECIAL CARE FACTORS FREQUENCY  PT (By licensed PT), OT (By licensed OT)     PT Frequency: 5x week OT Frequency: 5x week            Contractures Contractures Info: Not present    Additional Factors Info  Code Status, Allergies, Psychotropic, Insulin Sliding Scale Code Status Info: DNR Allergies Info: NKA Psychotropic Info: Donepezil Insulin Sliding Scale Info: See DC summary       Current Medications (02/24/2023):  This is the current hospital active medication list Current Facility-Administered Medications  Medication Dose Route Frequency Provider Last Rate Last Admin   (feeding supplement) PROSource Plus liquid 30 mL  30 mL Oral Daily Hollice Espy, MD   30 mL at 02/24/23 0930   0.9 %  sodium chloride infusion (Manually program via Guardrails IV Fluids)   Intravenous Once Adefeso, Oladapo, DO       acetaminophen (TYLENOL) tablet 650 mg  650 mg Oral Q6H PRN Adefeso, Oladapo, DO   650 mg at 02/24/23 5956   Or   acetaminophen (TYLENOL) suppository 650 mg  650 mg Rectal Q6H PRN Adefeso, Oladapo, DO       amLODipine (NORVASC) tablet 5 mg  5 mg Oral Daily Adefeso, Oladapo,  DO   5 mg at 02/24/23 0931   atorvastatin (LIPITOR) tablet 40 mg  40 mg Oral Daily Adefeso, Oladapo, DO   40 mg at 02/24/23 0932   cyanocobalamin (VITAMIN B12) tablet 2,000 mcg  2,000 mcg Oral Daily Adefeso, Oladapo, DO   2,000 mcg at 02/24/23 0932   donepezil (ARICEPT) tablet 10 mg  10 mg Oral QHS Adefeso, Oladapo, DO   10 mg at 02/23/23 2226   feeding supplement (BOOST / RESOURCE BREEZE) liquid 1 Container   1 Container Oral Q24H Hollice Espy, MD   1 Container at 02/23/23 1730   feeding supplement (ENSURE ENLIVE / ENSURE PLUS) liquid 237 mL  237 mL Oral BID BM Hollice Espy, MD   237 mL at 02/24/23 0932   ferrous sulfate tablet 325 mg  325 mg Oral Q breakfast Adefeso, Oladapo, DO   325 mg at 02/24/23 0824   heparin ADULT infusion 100 units/mL (25000 units/257mL)  850 Units/hr Intravenous Continuous Hollice Espy, MD 8.5 mL/hr at 02/23/23 2225 850 Units/hr at 02/23/23 2225   hydrALAZINE (APRESOLINE) tablet 10 mg  10 mg Oral Q6H PRN Hollice Espy, MD       insulin aspart (novoLOG) injection 0-9 Units  0-9 Units Subcutaneous TID WC Hollice Espy, MD   2 Units at 02/23/23 1820   lidocaine (LIDODERM) 5 % 1 patch  1 patch Transdermal Q24H Hollice Espy, MD   1 patch at 02/23/23 1239   losartan (COZAAR) tablet 100 mg  100 mg Oral Daily Adefeso, Oladapo, DO   100 mg at 02/24/23 9604   magic mouthwash  5 mL Oral TID Hollice Espy, MD   5 mL at 02/24/23 0932   memantine (NAMENDA) tablet 10 mg  10 mg Oral BID Adefeso, Oladapo, DO   10 mg at 02/24/23 5409   multivitamin with minerals tablet 1 tablet  1 tablet Oral Daily Hollice Espy, MD   1 tablet at 02/24/23 0932   ondansetron (ZOFRAN) tablet 4 mg  4 mg Oral Q6H PRN Adefeso, Oladapo, DO       Or   ondansetron (ZOFRAN) injection 4 mg  4 mg Intravenous Q6H PRN Adefeso, Oladapo, DO       pantoprazole (PROTONIX) EC tablet 40 mg  40 mg Oral Daily Adefeso, Oladapo, DO   40 mg at 02/24/23 0932     Discharge Medications: Please see discharge summary for a list of discharge medications.  Relevant Imaging Results:  Relevant Lab Results:   Additional Information SS# [id number] [address] [place]

## 2023-02-24 NOTE — Progress Notes (Signed)
ANTICOAGULATION CONSULT NOTE - Follow-up Consult  Pharmacy Consult for Heparin Indication:  mural thrombus  and pulmonary embolus  No Known Allergies  Patient Measurements: Height:  (170.2 cm) Weight: 64.9 kg (143 lb) IBW/kg (Calculated) : 66.1 Heparin Dosing Weight: HEPARIN DW (KG): 64.9   Vital Signs: Temp: 97.8 F (36.6 C) (04/25 0801) Temp Source: Oral (04/25 0801) BP: 145/68 (04/25 0801) Pulse Rate: 69 (04/25 0801)  Labs: Recent Labs    02/22/23 0343 02/22/23 0502 02/22/23 1141 02/22/23 1339 02/23/23 0627 02/24/23 0709  HGB 6.9*   < > 8.6*  --  8.4* 8.4*  HCT 24.3*   < > 29.2*  --  27.8* 28.3*  PLT 265  --   --   --  282 276  HEPARINUNFRC 0.59  --   --  0.69 0.70 0.50  CREATININE 1.05  --   --   --  1.07 1.29*   < > = values in this interval not displayed.     Estimated Creatinine Clearance: 44 mL/min (A) (by C-G formula based on SCr of 1.29 mg/dL (H)).   Medical History: Past Medical History:  Diagnosis Date   Allergy    Cancer    Skin cancer   Cataract    Coronary artery disease    Diabetes mellitus    Diverticulosis    Esophageal stricture    GERD (gastroesophageal reflux disease)    Hyperlipidemia    Hypertension    Peripheral vascular disease     Medications:  Medications Prior to Admission  Medication Sig Dispense Refill Last Dose   amLODipine (NORVASC) 5 MG tablet Take 1 tablet by mouth once daily (Patient taking differently: Take 5 mg by mouth daily.) 90 tablet 0 02/21/2023   atorvastatin (LIPITOR) 40 MG tablet Take 1 tablet by mouth once daily (Patient taking differently: Take 40 mg by mouth daily.) 90 tablet 1 02/21/2023   cyanocobalamin 2000 MCG tablet Take 1 tablet (2,000 mcg total) by mouth daily. 30 tablet 0 02/21/2023   Dihydroxyaluminum Sod Carb (ROLAIDS PO) Take 1 tablet by mouth as needed (heartburn).   UNK   donepezil (ARICEPT) 10 MG tablet Take 1 tablet daily (Patient taking differently: Take 10 mg by mouth daily.) 90 tablet  3 02/21/2023   Esomeprazole Magnesium (NEXIUM PO) Take 1 tablet by mouth daily as needed (acid reflux).   UNK   Iron, Ferrous Sulfate, 325 (65 Fe) MG TABS Take 325 mg by mouth daily. 30 tablet 3 02/21/2023   losartan (COZAAR) 100 MG tablet Take 1 tablet by mouth once daily 90 tablet 0 02/21/2023   memantine (NAMENDA) 10 MG tablet Take 1 tablet twice a day (Patient taking differently: Take 10 mg by mouth 2 (two) times daily.) 180 tablet 3 02/21/2023   metFORMIN (GLUCOPHAGE) 1000 MG tablet Take 1 tablet (1,000 mg total) by mouth 2 (two) times daily with a meal. 180 tablet 0 02/21/2023   OVER THE COUNTER MEDICATION Take 1 tablet by mouth daily. Allergy relief   02/21/2023   tiZANidine (ZANAFLEX) 4 MG tablet Take 0.5-1 tablets (2-4 mg total) by mouth at bedtime as needed for muscle spasms. 30 tablet 0 Past Week   Blood Glucose Monitoring Suppl (ONE TOUCH ULTRA 2) w/Device KIT 1 kit by Does not apply route daily. 1 kit 0    glucose blood (ONETOUCH ULTRA) test strip USE 1 STRIP TO CHECK GLUCOSE ONCE DAILY 100 each 0    Lancets (ONETOUCH ULTRASOFT) lancets Check blood sugars once daily. Dx: E11.51  100 each 12     Medication History pending.  No anticoagulation PTA.  Assessment: 77 yo M with ongoing pain s/p fall ~ 2 weeks.  Had additional imaging 4/22 which showed aneurysm for which pt was sent to the ED for further imaging.  [place] and CT showed 6 cm infrarenal abdominal aortic aneurysm is noted with a large amount of mural thrombus. Also has acute PE. Pharmacy has been consulted to dose heparin.  Noted anemia, today with hgb stable at 8.4. Heparin level at goal at 0.5. No bleeding or IV issues noted.   Goal of Therapy:  Heparin level 0.3-0.7 units/ml Monitor platelets by anticoagulation protocol: Yes  Plan:  Continue heparin at 850 units/hr  Monitor CBC closely due to patients anemia Daily heparin level while on heparin drip Continue to monitor for signs/symptoms of bleeding F/u plan for long-term  [facility]  Jeanella Cara, PharmD, [facility] Clinical Pharmacist Please see AMION for all Pharmacists' Contact Phone Numbers 02/24/2023, 8:29 AM

## 2023-02-24 NOTE — Progress Notes (Signed)
ANTICOAGULATION CONSULT NOTE - Follow-up Consult  Pharmacy Consult for Heparin transition to Xarelto Indication:  mural thrombus  and pulmonary embolus  No Known Allergies  Patient Measurements: Height:  (170.2 cm) Weight: 64.9 kg (143 lb) IBW/kg (Calculated) : 66.1    Vital Signs: Temp: 97.6 F (36.4 C) (04/25 1622) Temp Source: Oral (04/25 1622) BP: 165/73 (04/25 1622) Pulse Rate: 77 (04/25 1622)  Labs: Recent Labs    02/22/23 0343 02/22/23 0502 02/22/23 1141 02/22/23 1339 02/23/23 0627 02/24/23 0709  HGB 6.9*   < > 8.6*  --  8.4* 8.4*  HCT 24.3*   < > 29.2*  --  27.8* 28.3*  PLT 265  --   --   --  282 276  HEPARINUNFRC 0.59  --   --  0.69 0.70 0.50  CREATININE 1.05  --   --   --  1.07 1.29*   < > = values in this interval not displayed.     Estimated Creatinine Clearance: 44 mL/min (A) (by C-G formula based on SCr of 1.29 mg/dL (H)).   Medical History: Past Medical History:  Diagnosis Date   Allergy    Cancer    Skin cancer   Cataract    Coronary artery disease    Diabetes mellitus    Diverticulosis    Esophageal stricture    GERD (gastroesophageal reflux disease)    Hyperlipidemia    Hypertension    Peripheral vascular disease     Medications:  Medications Prior to Admission  Medication Sig Dispense Refill Last Dose   amLODipine (NORVASC) 5 MG tablet Take 1 tablet by mouth once daily (Patient taking differently: Take 5 mg by mouth daily.) 90 tablet 0 02/21/2023   atorvastatin (LIPITOR) 40 MG tablet Take 1 tablet by mouth once daily (Patient taking differently: Take 40 mg by mouth daily.) 90 tablet 1 02/21/2023   cyanocobalamin 2000 MCG tablet Take 1 tablet (2,000 mcg total) by mouth daily. 30 tablet 0 02/21/2023   Dihydroxyaluminum Sod Carb (ROLAIDS PO) Take 1 tablet by mouth as needed (heartburn).   UNK   donepezil (ARICEPT) 10 MG tablet Take 1 tablet daily (Patient taking differently: Take 10 mg by mouth daily.) 90 tablet 3 02/21/2023    Esomeprazole Magnesium (NEXIUM PO) Take 1 tablet by mouth daily as needed (acid reflux).   UNK   Iron, Ferrous Sulfate, 325 (65 Fe) MG TABS Take 325 mg by mouth daily. 30 tablet 3 02/21/2023   losartan (COZAAR) 100 MG tablet Take 1 tablet by mouth once daily 90 tablet 0 02/21/2023   memantine (NAMENDA) 10 MG tablet Take 1 tablet twice a day (Patient taking differently: Take 10 mg by mouth 2 (two) times daily.) 180 tablet 3 02/21/2023   metFORMIN (GLUCOPHAGE) 1000 MG tablet Take 1 tablet (1,000 mg total) by mouth 2 (two) times daily with a meal. 180 tablet 0 02/21/2023   OVER THE COUNTER MEDICATION Take 1 tablet by mouth daily. Allergy relief   02/21/2023   tiZANidine (ZANAFLEX) 4 MG tablet Take 0.5-1 tablets (2-4 mg total) by mouth at bedtime as needed for muscle spasms. 30 tablet 0 Past Week   Blood Glucose Monitoring Suppl (ONE TOUCH ULTRA 2) w/Device KIT 1 kit by Does not apply route daily. 1 kit 0    glucose blood (ONETOUCH ULTRA) test strip USE 1 STRIP TO CHECK GLUCOSE ONCE DAILY 100 each 0    Lancets (ONETOUCH ULTRASOFT) lancets Check blood sugars once daily. 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  No anticoagulation PTA.  Assessment: 77 yo M with ongoing pain s/p fall ~ 2 weeks.  Had additional imaging 4/22 which showed aneurysm for which pt was sent to the ED for further imaging.  [place] and CT showed 6 cm infrarenal abdominal aortic aneurysm is noted with a large amount of mural thrombus. Also has acute PE.  Noted anemia, today with hgb stable at 8.4. Heparin level at goal at 0.5. No bleeding or IV issues noted.   Goal of Therapy:  Monitor platelets by anticoagulation protocol: Yes  Plan:  DC heparin infusion Start xarelto 15 mg po bid x 21 days f/b 20 mg po qday after supper Pharmacy will sign off of consult but continue to follow making recommendations prn Thank you for consulting Pharmacy  Odetta Pink, PharmD, BCPS Clinical Pharmacist 02/24/2023 5:13 PM  Contact:  [phone] after 3 PM  "Be curious, not judgmental..." -Debbora Dus

## 2023-02-25 ENCOUNTER — Inpatient Hospital Stay (HOSPITAL_COMMUNITY): Payer: Medicare HMO

## 2023-02-25 ENCOUNTER — Other Ambulatory Visit (HOSPITAL_COMMUNITY): Payer: Self-pay

## 2023-02-25 DIAGNOSIS — I2699 Other pulmonary embolism without acute cor pulmonale: Secondary | ICD-10-CM | POA: Diagnosis not present

## 2023-02-25 DIAGNOSIS — S32010A Wedge compression fracture of first lumbar vertebra, initial encounter for closed fracture: Secondary | ICD-10-CM | POA: Diagnosis not present

## 2023-02-25 DIAGNOSIS — I714 Abdominal aortic aneurysm, without rupture, unspecified: Secondary | ICD-10-CM | POA: Diagnosis not present

## 2023-02-25 DIAGNOSIS — F0390 Unspecified dementia without behavioral disturbance: Secondary | ICD-10-CM | POA: Diagnosis not present

## 2023-02-25 LAB — BASIC METABOLIC PANEL
Anion gap: 10 (ref 5–15)
BUN: 19 mg/dL (ref 8–23)
CO2: 24 mmol/L (ref 22–32)
Calcium: 8.6 mg/dL — ABNORMAL LOW (ref 8.9–10.3)
Chloride: 101 mmol/L (ref 98–111)
Creatinine, Ser: 1.22 mg/dL (ref 0.61–1.24)
GFR, Estimated: >60 mL/min (ref >60)
Glucose, Bld: 99 mg/dL (ref 70–99)
Potassium: 3.7 mmol/L (ref 3.5–5.1)
Sodium: 135 mmol/L (ref 135–145)

## 2023-02-25 LAB — VAS US ABI WITH/WO TBI
Left ABI: 1.1
Right ABI: 0.82

## 2023-02-25 LAB — CBC
HCT: 28.4 % — ABNORMAL LOW (ref 39.0–52.0)
Hemoglobin: 8.4 g/dL — ABNORMAL LOW (ref 13.0–17.0)
MCH: 23.7 pg — ABNORMAL LOW (ref 26.0–34.0)
MCHC: 29.6 g/dL — ABNORMAL LOW (ref 30.0–36.0)
MCV: 80.2 fL (ref 80.0–100.0)
Platelets: 323 10*3/uL (ref 150–400)
RBC: 3.54 MIL/uL — ABNORMAL LOW (ref 4.22–5.81)
RDW: 18.2 % — ABNORMAL HIGH (ref 11.5–15.5)
WBC: 13.7 10*3/uL — ABNORMAL HIGH (ref 4.0–10.5)
nRBC: 0 % (ref 0.0–0.2)

## 2023-02-25 LAB — GLUCOSE, CAPILLARY
Glucose-Capillary: 104 mg/dL — ABNORMAL HIGH (ref 70–99)
Glucose-Capillary: 142 mg/dL — ABNORMAL HIGH (ref 70–99)
Glucose-Capillary: 146 mg/dL — ABNORMAL HIGH (ref 70–99)
Glucose-Capillary: 148 mg/dL — ABNORMAL HIGH (ref 70–99)
Glucose-Capillary: 203 mg/dL — ABNORMAL HIGH (ref 70–99)
Glucose-Capillary: 255 mg/dL — ABNORMAL HIGH (ref 70–99)

## 2023-02-25 MED ORDER — LACTULOSE 10 GM/15ML PO SOLN
10.0000 g | Freq: Once | ORAL | Status: AC
  Administered 2023-02-25: 10 g via ORAL
  Filled 2023-02-25: qty 15

## 2023-02-25 MED ORDER — OXYCODONE HCL 5 MG PO TABS
2.5000 mg | ORAL_TABLET | ORAL | Status: DC | PRN
  Administered 2023-02-25 – 2023-03-02 (×11): 2.5 mg via ORAL
  Filled 2023-02-25 (×11): qty 1

## 2023-02-25 MED ORDER — MORPHINE SULFATE (PF) 2 MG/ML IV SOLN: 2.0000 mg | INTRAVENOUS | Status: DC | PRN

## 2023-02-25 NOTE — Plan of Care (Signed)
  Problem: Health Behavior/Discharge Planning: Goal: Ability to manage health-related needs will improve Outcome: Not Progressing   Problem: Clinical Measurements: Goal: Cardiovascular complication will be avoided Outcome: Progressing   Problem: Nutrition: Goal: Adequate nutrition will be maintained Outcome: Not Progressing   Problem: Coping: Goal: Level of anxiety will decrease Outcome: Progressing   Problem: Elimination: Goal: Will not experience complications related to urinary retention Outcome: Progressing

## 2023-02-25 NOTE — Progress Notes (Signed)
Granddaughter at bedside. She was concerned about his oxygen levels when he got back to the room after he took a walk with PT. Rechecked pulse & O2 as requested.

## 2023-02-25 NOTE — Progress Notes (Signed)
PROGRESS NOTE    Walter [name]  ZOX:[id number] DOB: [birth year] DOA: 02/21/2023 PCP: Kristian Covey, MD   Brief Narrative:    Walter [name] is a 77 y.o. male with medical history significant of hypertension, hyperlipidemia, T2DM, GERD, dementia who presented to the emergency department on 4/22 for evaluation of abnormal aortic aneurysm that was seen on outpatient x-ray that was done for 2 weeks of lower back pain after a fall.  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  Images that extended into the lower chest noted small subsegmental incidental pulmonary embolus in right lower lobe.  Patient had initially presented to [facility] emergency room in [place] and transferred to [facility] on night of 4/23.  Vascular surgery formally consulted.  Assessment & Plan:   Principal Problem:   Abdominal aortic aneurysm (HCC) Active Problems:   Essential hypertension   GERD (gastroesophageal reflux disease)   Acute pulmonary embolism (HCC)   Closed compression fracture of L1 lumbar vertebra, initial encounter (HCC)   Recurrent falls   Hypoalbuminemia due to protein-calorie malnutrition (HCC)   Mixed hyperlipidemia   Prediabetes   Dementia without behavioral disturbance (HCC)   Normocytic anemia   Protein-calorie malnutrition, severe (HCC)  Assessment and Plan:   Abdominal aortic aneurysm enlarged, close to 6 cm.  Patient seen by vascular surgery who recommended no immediate intervention.  Suspect he will likely need surgery, although it is not urgent and likely has been there for some time.  His more immediate issues are his pulmonary embolus.    Subsegmental pulmonary embolism Patient was started on IV heparin drip.  Suspect etiology is after lower back pain issues, patient reportedly almost nonmobile for the past 2 weeks.  However prior to that, quite  ambulatory.  Lower extremity Dopplers confirm clot in right lower extremity.  Patient transition from heparin to Xarelto.  Acute anemia Patient noted to be on iron and B12 supplementation at home.  Noted MCV of 77, and iron panel with normal TIBC, but low iron and ferritin, from likely some component of chronic disease.  Will add iron supplement.  Hemoglobin has remained stable Status post 1 unit PRBC transfusion with no overt bleeding noted   Age-indeterminate L1 compression fracture in the setting of recurrent falls Continue fall precautions.  Given his previous active lifestyle, feel like he is good a good candidate for anticoagulation.  Seen by PT and OT who are recommending skilled nursing, will plan to transfer on Monday.  Checking MRI of the lumbar spine tomorrow.  Lidocaine patch initial improved patient's back pain, but now more so.  Have added as needed Oxy IR   Severe protein calorie malnutrition: According to family, patient's p.o. intake has been declining over the past year.  Nutrition Status: Nutrition Problem: Severe Malnutrition Etiology: chronic illness Signs/Symptoms: severe fat depletion, moderate fat depletion, severe muscle depletion, energy intake < or equal to 75% for > or equal to 1 month Interventions: Ensure Enlive (each supplement provides 350kcal and 20 grams of protein), MVI, Liberalize Diet Appreciate nutrition help  Oral thrush: Magic mouthwash   Essential hypertension Continue amlodipine, losartan   Prediabetes Hemoglobin A1c on 12/08/2022 was 5.8 Continue diet and lifestyle modification at this time   Mixed hyperlipidemia Continue Lipitor   GERD Continue Protonix   Dementia Continue Aricept, Namenda   DVT prophylaxis: Heparin drip Code Status: DNR Family  Communication: Wife at bedside Disposition Plan:  Status is: Inpatient -MRI for lower back -Needs skilled nursing   Consultants:  Vascular surgery-Dr. Randie Heinz  Procedures:  1 unit packed  red blood cell transfusion Lower extremity Dopplers with results pending  Antimicrobials:  None   Subjective: Complains of more back pain today.  Objective: Vitals:   02/24/23 1622 02/24/23 2133 02/25/23 0536 02/25/23 0755  BP: (!) 165/73 (!) 159/76 (!) 153/75 131/65  Pulse: 77 82 88 (!) 48  Resp: 18 18 18 17   Temp: 97.6 F (36.4 C) 97.7 F (36.5 C) 97.7 F (36.5 C) 97.8 F (36.6 C)  TempSrc: Oral Oral Oral Oral  SpO2: 99% 96% 96% 97%  Weight:      Height:        Intake/Output Summary (Last 24 hours) at 02/25/2023 1338 Last data filed at 02/25/2023 0543 Gross per 24 hour  Intake 240 ml  Output 450 ml  Net -210 ml    Filed Weights   02/21/23 1159  Weight: 64.9 kg    Examination:  General exam: Appears calm and comfortable, no acute distress, oriented x 2 HEENT: Mucous membranes are dry, some mild thrush patch on tongue Respiratory system: Clear to auscultation. Respiratory effort normal. Cardiovascular system: S1 & S2 heard, RRR.  Gastrointestinal system: Abdomen is soft, nontender, nondistended, positive bowel sounds Central nervous system: No focal deficits Extremities: No edema Skin: No significant lesions noted Psychiatry: Flat affect.  Appropriate    Data Reviewed: I have personally reviewed following labs and imaging studies White blood cell count increased to 13.7.  Hemoglobin stable at 8.4.  Creatinine with slight improvement at 1.22    Radiology Studies: MR LUMBAR SPINE WO CONTRAST  Result Date: 02/25/2023 CLINICAL DATA:  Compression fracture EXAM: MRI LUMBAR SPINE WITHOUT CONTRAST TECHNIQUE: Multiplanar, multisequence MR imaging of the lumbar spine was performed. No intravenous contrast was administered. COMPARISON:  No prior MRI of the lumbar spine available, correlation is made with CT abdomen pelvis 02/21/2023 and 02/16/2023 lumbar spine radiograph FINDINGS: Segmentation:  5 lumbar type vertebral bodies. Alignment:  Levocurvature of the lumbar  spine.  No listhesis. Vertebrae: As seen on the prior CT and radiograph, there is a T1 compression fracture, with up to 70% vertebral body height loss and 3 mm retropulsion of posterior cortex centrally. Within the vertebral body there is a linear focus of marked T2 hyperintense signal. No other acute fracture, suspicious osseous lesion, or evidence of discitis. Conus medullaris and cauda equina: Conus extends to the L1 level. Conus and cauda equina appear normal. Paraspinal and other soft tissues: Fluid collection in the left psoas muscle, which measures up to 4.0 x 3.6 cm in the axial plane (series 5, image 18) and extends from approximately the level of L1 to L4; some of the fluid is T1 hypointense and T2 heterogeneous. This was not evident on the prior CT. No other acute process. Disc levels: T12-L1: No significant disc bulge. Mild retropulsion of the posterosuperior cortex of L1. No spinal canal stenosis or neural foraminal narrowing. L1-L2: Minimal disc bulge with left foraminal and extreme lateral disc protrusion. Mild facet arthropathy. No spinal canal stenosis or neural foraminal narrowing. L2-L3: No significant disc bulge. Mild facet arthropathy. No spinal canal stenosis or neural foraminal narrowing. L3-L4: Minimal disc bulge. Mild facet arthropathy. Narrowing of the lateral recesses. No spinal canal stenosis or neural foraminal narrowing. L4-L5: Minimal disc bulge. Mild facet arthropathy. No spinal canal stenosis. No neural foraminal narrowing. L5-S1: No significant disc  bulge. No spinal canal stenosis or neural foraminal narrowing. IMPRESSION: 1. Acute to subacute L1 compression fracture, with up to 70% vertebral body height loss and 3 mm retropulsion of the posterior cortex. No spinal canal stenosis or neural foraminal narrowing at this level. 2. Fluid collection in the left psoas muscle, which was not evident on the prior CT and likely represents a hematoma. 3. No spinal canal stenosis or neural  foraminal narrowing. Narrowing of the lateral recesses at L3-L4 could affect the descending L4 nerve roots. Electronically Signed   By: Wiliam Ke M.D.   On: 02/25/2023 13:01   VAS [place] ABI WITH/WO TBI  Result Date: 02/25/2023  LOWER EXTREMITY DOPPLER STUDY Patient Name:  Tamarion Haymond  Date of Exam:   02/24/2023 Medical Rec #: [id number]           Accession #:    [id number] Date of Birth: 1946-04-03            Patient Gender: M Patient Age:   89 years Exam Location:  [facility] Procedure:      VAS [place] ABI WITH/WO TBI Referring Phys: [name] --------------------------------------------------------------------------------  Indications: Right EIA and CFA occlusion by CTA of abdomen High Risk         Hypertension, hyperlipidemia, Diabetes, coronary artery Factors:          disease. Other Factors: AAA.  Comparison Study: No prior study on file Performing Technologist: Sherren Kerns RVS  Examination Guidelines: A complete evaluation includes at minimum, Doppler waveform signals and systolic blood pressure reading at the level of bilateral brachial, anterior tibial, and posterior tibial arteries, when vessel segments are accessible. Bilateral testing is considered an integral part of a complete examination. Photoelectric Plethysmograph (PPG) waveforms and toe systolic pressure readings are included as required and additional duplex testing as needed. Limited examinations for reoccurring indications may be performed as noted.  ABI Findings: +---------+------------------+-----+-----------+--------+ Right    Rt Pressure (mmHg)IndexWaveform   Comment  +---------+------------------+-----+-----------+--------+ Brachial 157                    triphasic           +---------+------------------+-----+-----------+--------+ PTA      129               0.82 multiphasic         +---------+------------------+-----+-----------+--------+ DP       111               0.71 multiphasic          +---------+------------------+-----+-----------+--------+ Great Toe93                0.59 Abnormal            +---------+------------------+-----+-----------+--------+ +---------+------------------+-----+-----------+-------+ Left     Lt Pressure (mmHg)IndexWaveform   Comment +---------+------------------+-----+-----------+-------+ Brachial 150                    triphasic          +---------+------------------+-----+-----------+-------+ PTA      172               1.10 multiphasic        +---------+------------------+-----+-----------+-------+ DP       168               1.07 multiphasic        +---------+------------------+-----+-----------+-------+ Great Toe102               0.65 Abnormal           +---------+------------------+-----+-----------+-------+ +-------+-----------+-----------+------------+------------+  ABI/TBIToday's ABIToday's TBIPrevious ABIPrevious TBI +-------+-----------+-----------+------------+------------+ Right  0.82       0.59                                +-------+-----------+-----------+------------+------------+ Left   1.10       0.65                                +-------+-----------+-----------+------------+------------+   Summary: Right: Resting right ankle-brachial index indicates mild right lower extremity arterial disease. The right toe-brachial index is abnormal. Left: Resting left ankle-brachial index is within normal range. The left toe-brachial index is abnormal. *See table(s) above for measurements and observations.  Electronically signed by Gerarda Fraction on 02/25/2023 at 9:03:52 AM.    Final    DG Abd Portable 1V  Result Date: 02/24/2023 CLINICAL DATA:  Ileus.  Nausea and vomiting. EXAM: PORTABLE ABDOMEN - 1 VIEW COMPARISON:  CT 02/21/2023 FINDINGS: Gas and stool throughout the colon. No small or large bowel distention. No radiopaque stones. Vascular calcifications with curvilinear calcification suggesting aortic aneurysm.  Aneurysm was confirmed at prior CT. See additional report. Degenerative changes in the spine and hips. Calcified phleboliths in the pelvis. IMPRESSION: Nonobstructive bowel gas pattern. Vascular calcification with abdominal aortic aneurysm. See additional report of prior CT. Electronically Signed   By: Burman Nieves M.D.   On: 02/24/2023 21:48   VAS [place] LOWER EXTREMITY VENOUS (DVT)  Result Date: 02/24/2023  Lower Venous DVT Study Patient Name:  ORVIN NETTER  Date of Exam:   02/24/2023 Medical Rec #: [id number]           Accession #:    [id number] Date of Birth: 1946-06-29            Patient Gender: M Patient Age:   37 years Exam Location:  [facility] Procedure:      VAS [place] LOWER EXTREMITY VENOUS (DVT) Referring Phys: [name] --------------------------------------------------------------------------------  Indications: Pulmonary embolism.  Limitations: Acoustic shadowing from heavily calcified arteries. Comparison Study: No prior study Performing Technologist: Sherren Kerns RVS  Examination Guidelines: A complete evaluation includes B-mode imaging, spectral Doppler, color Doppler, and power Doppler as needed of all accessible portions of each vessel. Bilateral testing is considered an integral part of a complete examination. Limited examinations for reoccurring indications may be performed as noted. The reflux portion of the exam is performed with the patient in reverse Trendelenburg.  +---------+---------------+---------+-----------+----------+--------------+ RIGHT    CompressibilityPhasicitySpontaneityPropertiesThrombus Aging +---------+---------------+---------+-----------+----------+--------------+ CFV      Full           Yes      Yes                                 +---------+---------------+---------+-----------+----------+--------------+ SFJ      Full                                                         +---------+---------------+---------+-----------+----------+--------------+ FV Prox  Full                                                        +---------+---------------+---------+-----------+----------+--------------+  FV Mid   Full           Yes      Yes                                 +---------+---------------+---------+-----------+----------+--------------+ FV DistalFull                                                        +---------+---------------+---------+-----------+----------+--------------+ PFV      Full                                                        +---------+---------------+---------+-----------+----------+--------------+ POP      Full           Yes      Yes                                 +---------+---------------+---------+-----------+----------+--------------+ PTV      None                                         Acute          +---------+---------------+---------+-----------+----------+--------------+ PERO     Full                                                        +---------+---------------+---------+-----------+----------+--------------+   +---------+---------------+---------+-----------+----------+--------------+ LEFT     CompressibilityPhasicitySpontaneityPropertiesThrombus Aging +---------+---------------+---------+-----------+----------+--------------+ CFV      Full           Yes      Yes                                 +---------+---------------+---------+-----------+----------+--------------+ SFJ      Full                                                        +---------+---------------+---------+-----------+----------+--------------+ FV Prox  Full                                                        +---------+---------------+---------+-----------+----------+--------------+ FV Mid   Full           Yes      Yes                                  +---------+---------------+---------+-----------+----------+--------------+  FV DistalFull                                                        +---------+---------------+---------+-----------+----------+--------------+ PFV      Full                                                        +---------+---------------+---------+-----------+----------+--------------+ POP      Full           Yes      Yes                                 +---------+---------------+---------+-----------+----------+--------------+ PTV      Full                                                        +---------+---------------+---------+-----------+----------+--------------+ PERO     Full                                                        +---------+---------------+---------+-----------+----------+--------------+    Summary: BILATERAL: -No evidence of popliteal cyst, bilaterally. RIGHT: - Findings consistent with acute deep vein thrombosis involving the right posterior tibial veins.  LEFT: - There is no evidence of deep vein thrombosis in the lower extremity.  *See table(s) above for measurements and observations.    Preliminary         Scheduled Meds:  (feeding supplement) PROSource Plus  30 mL Oral Daily   sodium chloride   Intravenous Once   amLODipine  5 mg Oral Daily   atorvastatin  40 mg Oral Daily   cyanocobalamin  2,000 mcg Oral Daily   donepezil  10 mg Oral QHS   feeding supplement  1 Container Oral Q24H   feeding supplement  237 mL Oral BID BM   ferrous sulfate  325 mg Oral Q breakfast   insulin aspart  0-9 Units Subcutaneous TID WC   lidocaine  1 patch Transdermal Q24H   losartan  100 mg Oral Daily   magic mouthwash  5 mL Oral TID   memantine  10 mg Oral BID   multivitamin with minerals  1 tablet Oral Daily   pantoprazole  40 mg Oral Daily   Rivaroxaban  15 mg Oral BID WC   Followed by   Melene Muller ON 03/17/2023] rivaroxaban  20 mg Oral Q supper   Continuous  Infusions:     LOS: 4 days       Kitai Purdom Mordecai Rasmussen, DO [facility]  If 7PM-7AM, please contact night-coverage [email or web address] 02/25/2023, 1:38 PM

## 2023-02-25 NOTE — TOC Progression Note (Signed)
Transition of Care [facility]) - Progression Note    Patient Details  Name: Walter [name] MRN: [id number] Date of Birth: 05-29-1946  [facility] CM/SW Contact  Carley Hammed, LCSW Phone Number: 02/25/2023, 12:23 PM  Clinical Narrative:    CSW provided bed offers to pt and spouse at bedside. Pt and spouse agreeable to [facility] rehab. Noted pt is not medically ready at this time, maybe early next week. CSW explained insurance and discharge process. CSW notified facility and they will have a bed for pt at discharge. Will have auth started with new PT note. TOC will continue to follow for DC needs.    Expected Discharge Plan: Skilled Nursing Facility Barriers to Discharge: Continued Medical Work up, SNF Pending bed offer, English as a second language teacher  Expected Discharge Plan and Services     Post Acute Care Choice: Skilled Nursing Facility Living arrangements for the past 2 months: Single Family Home                                       Social Determinants of Health (SDOH) Interventions SDOH Screenings   Food Insecurity: Patient Declined (02/22/2023)  Housing: Low Risk  (02/22/2023)  Transportation Needs: Patient Declined (02/22/2023)  Utilities: Patient Declined (02/22/2023)  Alcohol Screen: Low Risk  (04/23/2022)  Depression (PHQ2-9): High Risk (02/16/2023)  Financial Resource Strain: Low Risk  (04/23/2022)  Physical Activity: Inactive (04/23/2022)  Social Connections: Socially Integrated (04/23/2022)  Stress: No Stress Concern Present (04/23/2022)  Tobacco Use: Medium Risk (02/21/2023)    Readmission Risk Interventions     No data to display

## 2023-02-25 NOTE — Progress Notes (Signed)
   02/25/23 0810  Hygiene  Hygiene Patient refused;Other (Comment) (Patient stated that he doesn't think he needs one because he hasn't been outside playing in the dirt.)

## 2023-02-25 NOTE — Progress Notes (Signed)
Patient only ate a few bites of one chicken strip from his lunch tray.

## 2023-02-25 NOTE — Progress Notes (Signed)
Physical Therapy Treatment Patient Details Name: Walter [name] MRN: [id number] DOB: Jun 11, 1946 Today's Date: 02/25/2023   History of Present Illness 77 y.o. 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  x-ray showed an age-indeterminate L1 compression fracture. He has also not been able to tolerate most foods for several months. PMH: CAD, DMII, esophageal stricture, GERD, HTN, DDD, HLD, and dementia.    PT Comments    Pt received in supine and agreeable to session. Pt demonstrating increased fatigue and festination during gait trial this session. Pt able to briefly increase step length, however was unable to maintain. Pt required cues for standing rest break to stop festination and prevent LOB. Pt continues to be limited by decreased activity tolerance. Pt continues to benefit from PT services to progress toward functional mobility goals.     Recommendations for follow up therapy are one component of a multi-disciplinary discharge planning process, led by the attending physician.  Recommendations may be updated based on patient status, additional functional criteria and insurance authorization.     Assistance Recommended at Discharge Frequent or constant Supervision/Assistance  Patient can return home with the following A little help with walking and/or transfers;Assistance with cooking/housework;Help with stairs or ramp for entrance   Equipment Recommendations  Rolling walker (2 wheels);Other (comment)    Recommendations for Other Services       Precautions / Restrictions Precautions Precautions: Fall Precaution Comments: shuffling gait form parkinson's Restrictions Weight Bearing Restrictions: No     Mobility  Bed Mobility Overal bed mobility: Needs Assistance Bed Mobility: Supine to Sit, Sit to Supine     Supine to sit: Min guard, HOB elevated Sit to supine: Min  guard   General bed mobility comments: increased time and pt asked for assist, however was able to complete without assist.    Transfers Overall transfer level: Needs assistance Equipment used: Rolling walker (2 wheels) Transfers: Sit to/from Stand Sit to Stand: Min guard           General transfer comment: Cues for hand placement    Ambulation/Gait Ambulation/Gait assistance: Min guard Gait Distance (Feet): 70 Feet Assistive device: Rolling walker (2 wheels) Gait Pattern/deviations: Trunk flexed, Festinating       General Gait Details: Cues for large steps and pt able to briefly correct.      Balance Overall balance assessment: History of Falls, Needs assistance Sitting-balance support: Feet supported, No upper extremity supported Sitting balance-Leahy Scale: Fair Sitting balance - Comments: sitting EOB. Pt with posterior lean, but able to correct with cues   Standing balance support: Bilateral upper extremity supported, During functional activity Standing balance-Leahy Scale: Poor Standing balance comment: with RW support                            Cognition Arousal/Alertness: Awake/alert Behavior During Therapy: WFL for tasks assessed/performed Overall Cognitive Status: History of cognitive impairments - at baseline                                 General Comments: perfamily he is at his baseline        Exercises      General Comments General comments (skin integrity, edema, etc.): Pt's granddaughter present and supportive throughout session  Pertinent Vitals/Pain Pain Assessment Pain Assessment: 0-10 Pain Score: 4  Pain Location: low back Pain Descriptors / Indicators: Aching, Discomfort, Grimacing Pain Intervention(s): Monitored during session, Repositioned     PT Goals (current goals can now be found in the care plan section) Acute Rehab PT Goals Patient Stated Goal: less back pain PT Goal Formulation: With  patient Time For Goal Achievement: 03/09/23 Potential to Achieve Goals: Good Progress towards PT goals: Progressing toward goals    Frequency    Min 4X/week      PT Plan Current plan remains appropriate       AM-PAC PT "6 Clicks" Mobility   Outcome Measure  Help needed turning from your back to your side while in a flat bed without using bedrails?: None Help needed moving from lying on your back to sitting on the side of a flat bed without using bedrails?: A Little Help needed moving to and from a bed to a chair (including a wheelchair)?: A Little Help needed standing up from a chair using your arms (e.g., wheelchair or bedside chair)?: A Little Help needed to walk in hospital room?: A Little Help needed climbing 3-5 steps with a railing? : A Little 6 Click Score: 19    End of Session Equipment Utilized During Treatment: Gait belt Activity Tolerance: Patient tolerated treatment well Patient left: in bed;with call bell/phone within reach;with family/visitor present Nurse Communication: Mobility status PT Visit Diagnosis: Unsteadiness on feet (R26.81);Other abnormalities of gait and mobility (R26.89);History of falling (Z91.81);Pain     Time: 1610-9604 PT Time Calculation (min) (ACUTE ONLY): 16 min  Charges:  $Gait Training: 8-22 mins                     Johny Shock, PTA Acute Rehabilitation Services Secure Chat Preferred  [phone]    Johny Shock 02/25/2023, 4:22 PM

## 2023-02-25 NOTE — TOC Benefit Eligibility Note (Signed)
Patient Product/process development scientist completed.    The patient is currently admitted and upon discharge could be taking Xarelto Starter Pack.  The current 30 day co-pay is $47.00.     Patient Product/process development scientist completed.    The patient is currently admitted and upon discharge could be taking Xarelto.  The current 30 day co-pay is $47.00.   The patient is insured through [employer] Part D   This test claim was processed through [facility]- copay amounts may vary at other pharmacies due to pharmacy/plan contracts, or as the patient moves through the different stages of their insurance plan.

## 2023-02-25 NOTE — Progress Notes (Signed)
Occupational Therapy Treatment Patient Details Name: Walter [name] MRN: [id number] DOB: 1945/12/15 Today's Date: 02/25/2023   History of present illness 77 y.o. 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  x-ray showed an age-indeterminate L1 compression fracture. He has also not been able to tolerate most foods for several months. PMH: CAD, DMII, esophageal stricture, GERD, HTN, DDD, HLD, and dementia.   OT comments  Pt able to participate with mobility @ RW level however more assistance required for bed mobility due to generalized weakness and "not feeling well". Session limited due to pt being taken by transport.  Patient will benefit from continued inpatient follow up therapy, <3 hours/day   .   Recommendations for follow up therapy are one component of a multi-disciplinary discharge planning process, led by the attending physician.  Recommendations may be updated based on patient status, additional functional criteria and insurance authorization.    Assistance Recommended at Discharge Frequent or constant Supervision/Assistance  Patient can return home with the following  A little help with walking and/or transfers;A little help with bathing/dressing/bathroom;Assistance with cooking/housework;Direct supervision/assist for medications management;Direct supervision/assist for financial management;Assist for transportation;Help with stairs or ramp for entrance   Equipment Recommendations  Other (comment) (RW)    Recommendations for Other Services      Precautions / Restrictions Precautions Precautions: Fall Precaution Comments: shuffling gait form parkinson's Restrictions Weight Bearing Restrictions: No       Mobility Bed Mobility Overal bed mobility: Needs Assistance Bed Mobility: Supine to Sit, Sit to Supine Rolling: Supervision Sidelying to sit: Mod assist   Sit  to supine: Mod assist   General bed mobility comments: increased assistance required for mobility due to weakness    Transfers Overall transfer level: Needs assistance Equipment used: Rolling walker (2 wheels) Transfers: Sit to/from Stand Sit to Stand: Min guard           General transfer comment: Cues for hand placement     Balance Overall balance assessment: History of Falls, Needs assistance Sitting-balance support: Feet supported, No upper extremity supported Sitting balance-Leahy Scale: Fair Sitting balance - Comments: sitting EOB. Pt with posterior lean, but able to correct with cues   Standing balance support: Bilateral upper extremity supported, During functional activity Standing balance-Leahy Scale: Poor Standing balance comment: with RW support                           ADL either performed or assessed with clinical judgement   ADL Overall ADL's : Needs assistance/impaired Eating/Feeding: Set up   Grooming: Set up;Sitting               Lower Body Dressing: Minimal assistance;Sit to/from stand   Toilet Transfer: Minimal assistance;Ambulation;Rolling walker (2 wheels)   Toileting- Clothing Manipulation and Hygiene: Minimal assistance;Sit to/from stand       Functional mobility during ADLs: Minimal assistance;Rolling walker (2 wheels);Cueing for safety General ADL Comments: decreaased tolerance for activity    Extremity/Trunk Assessment Upper Extremity Assessment Upper Extremity Assessment: Generalized weakness   Lower Extremity Assessment Lower Extremity Assessment: Defer to PT evaluation        Vision       Perception     Praxis      Cognition Arousal/Alertness: Awake/alert Behavior During Therapy: WFL for tasks assessed/performed Overall Cognitive Status: History of cognitive impairments -  at baseline                                 General Comments: perfamily he is at his baseline        Exercises       Shoulder Instructions       General Comments  Ambulated @ 50 ft with min A @ RW lvel; nauseated    Pertinent Vitals/ Pain       Pain Assessment Pain Location: low back Pain Descriptors / Indicators: Aching, Discomfort, Grimacing  Home Living                                          Prior Functioning/Environment              Frequency  Min 2X/week        Progress Toward Goals  OT Goals(current goals can now be found in the care plan section)  Progress towards OT goals: Progressing toward goals  Acute Rehab OT Goals Patient Stated Goal: to feel better OT Goal Formulation: With patient/family Time For Goal Achievement: 03/09/23 Potential to Achieve Goals: Good ADL Goals Pt Will Perform Lower Body Bathing: sit to/from stand;with supervision Pt Will Transfer to Toilet: with supervision Pt Will Perform Toileting - Clothing Manipulation and hygiene: with modified independence Additional ADL Goal #1: Pt will independently identify 3 strategies to reduce risk of falls  Plan Discharge plan remains appropriate    Co-evaluation                 AM-PAC OT "6 Clicks" Daily Activity     Outcome Measure   Help from another person eating meals?: A Little Help from another person taking care of personal grooming?: A Little Help from another person toileting, which includes using toliet, bedpan, or urinal?: A Little Help from another person bathing (including washing, rinsing, drying)?: A Little Help from another person to put on and taking off regular upper body clothing?: A Little Help from another person to put on and taking off regular lower body clothing?: A Little 6 Click Score: 18    End of Session Equipment Utilized During Treatment: Gait belt;Rolling walker (2 wheels)  OT Visit Diagnosis: Unsteadiness on feet (R26.81);Other abnormalities of gait and mobility (R26.89);Muscle weakness (generalized) (M62.81);History of falling  (Z91.81);Pain Pain - part of body:  (back)   Activity Tolerance Patient tolerated treatment well   Patient Left in bed (being taken by transport for procedure)   Nurse Communication Mobility status        Time: 5621-3086 OT Time Calculation (min): 15 min  Charges: OT General Charges $OT Visit: 1 Visit OT Treatments $Self Care/Home Management : 8-22 mins  Luisa Dago, OT/L   Acute OT Clinical Specialist Acute Rehabilitation Services Pager [phone] Office [phone]   [facility] 02/25/2023, 12:17 PM

## 2023-02-26 DIAGNOSIS — I714 Abdominal aortic aneurysm, without rupture, unspecified: Secondary | ICD-10-CM | POA: Diagnosis not present

## 2023-02-26 DIAGNOSIS — F0390 Unspecified dementia without behavioral disturbance: Secondary | ICD-10-CM | POA: Diagnosis not present

## 2023-02-26 DIAGNOSIS — I2699 Other pulmonary embolism without acute cor pulmonale: Secondary | ICD-10-CM | POA: Diagnosis not present

## 2023-02-26 DIAGNOSIS — S32010A Wedge compression fracture of first lumbar vertebra, initial encounter for closed fracture: Secondary | ICD-10-CM | POA: Diagnosis not present

## 2023-02-26 LAB — CBC
HCT: 27.5 % — ABNORMAL LOW (ref 39.0–52.0)
Hemoglobin: 8 g/dL — ABNORMAL LOW (ref 13.0–17.0)
MCH: 23.4 pg — ABNORMAL LOW (ref 26.0–34.0)
MCHC: 29.1 g/dL — ABNORMAL LOW (ref 30.0–36.0)
MCV: 80.4 fL (ref 80.0–100.0)
Platelets: 315 10*3/uL (ref 150–400)
RBC: 3.42 MIL/uL — ABNORMAL LOW (ref 4.22–5.81)
RDW: 18.6 % — ABNORMAL HIGH (ref 11.5–15.5)
WBC: 10.8 10*3/uL — ABNORMAL HIGH (ref 4.0–10.5)
nRBC: 0 % (ref 0.0–0.2)

## 2023-02-26 LAB — GLUCOSE, CAPILLARY
Glucose-Capillary: 113 mg/dL — ABNORMAL HIGH (ref 70–99)
Glucose-Capillary: 156 mg/dL — ABNORMAL HIGH (ref 70–99)
Glucose-Capillary: 164 mg/dL — ABNORMAL HIGH (ref 70–99)
Glucose-Capillary: 216 mg/dL — ABNORMAL HIGH (ref 70–99)
Glucose-Capillary: 262 mg/dL — ABNORMAL HIGH (ref 70–99)
Glucose-Capillary: 83 mg/dL (ref 70–99)

## 2023-02-26 MED ORDER — DOCUSATE SODIUM 100 MG PO CAPS
100.0000 mg | ORAL_CAPSULE | Freq: Two times a day (BID) | ORAL | Status: DC
  Administered 2023-02-26 – 2023-03-10 (×23): 100 mg via ORAL
  Filled 2023-02-26 (×26): qty 1

## 2023-02-26 MED ORDER — POLYETHYLENE GLYCOL 3350 17 G PO PACK
17.0000 g | PACK | Freq: Every day | ORAL | Status: DC
  Administered 2023-02-26 – 2023-03-06 (×6): 17 g via ORAL
  Filled 2023-02-26 (×9): qty 1

## 2023-02-26 MED ORDER — LACTULOSE 10 GM/15ML PO SOLN
20.0000 g | ORAL | Status: AC
  Administered 2023-02-26: 20 g via ORAL
  Filled 2023-02-26: qty 30

## 2023-02-26 NOTE — Progress Notes (Signed)
Physical Therapy Treatment Patient Details Name: Walter [name] MRN: [id number] DOB: [birth year] Today's Date: 02/26/2023   History of Present Illness 77 y.o. 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  x-ray showed an age-indeterminate L1 compression fracture. He has also not been able to tolerate most foods for several months. PMH: CAD, DMII, esophageal stricture, GERD, HTN, DDD, HLD, and dementia.    PT Comments    Son present for treatment. Remains high fall risk with festinating gait.  PT will continue to follow while he is in the hospital.    Recommendations for follow up therapy are one component of a multi-disciplinary discharge planning process, led by the attending physician.  Recommendations may be updated based on patient status, additional functional criteria and insurance authorization.  Follow Up Recommendations  Can patient physically be transported by private vehicle: Yes    Assistance Recommended at Discharge Frequent or constant Supervision/Assistance  Patient can return home with the following A little help with walking and/or transfers;Assistance with cooking/housework;Help with stairs or ramp for entrance;A little help with bathing/dressing/bathroom;Direct supervision/assist for medications management;Assist for transportation   Equipment Recommendations  Rolling walker (2 wheels)    Recommendations for Other Services       Precautions / Restrictions Precautions Precautions: Fall Precaution Comments: shuffling gait form parkinson's Restrictions Weight Bearing Restrictions: No     Mobility  Bed Mobility Overal bed mobility: Needs Assistance Bed Mobility: Supine to Sit Rolling: Min assist         General bed mobility comments: required upper body assist    Transfers Overall transfer level: Needs assistance Equipment used: Rolling walker (2  wheels) Transfers: Sit to/from Stand Sit to Stand: Min assist           General transfer comment: Cues for hand placement    Ambulation/Gait Ambulation/Gait assistance: Min guard Gait Distance (Feet): 30 Feet (10 feet twice more walking to BR) Assistive device: Rolling walker (2 wheels) Gait Pattern/deviations: Trunk flexed, Festinating Gait velocity: decreased, not formally measured     General Gait Details: Pts knees tend to flex more and more as he walks, occassionally self corrects and at times needs VC to stop and stand tall   Stairs             Wheelchair Mobility    Modified Rankin (Stroke Patients Only)       Balance Overall balance assessment: History of Falls, Needs assistance         Standing balance support: Bilateral upper extremity supported, During functional activity Standing balance-Leahy Scale: Poor Standing balance comment: with RW support                            Cognition Arousal/Alertness: Awake/alert Behavior During Therapy: WFL for tasks assessed/performed Overall Cognitive Status: History of cognitive impairments - at baseline                                          Exercises General Exercises - Lower Extremity Long Arc Quad: AROM, Both, 10 reps, Seated Hip Flexion/Marching: AROM, 10 reps, Seated, Both (mor difficulty raising LLE)    General Comments General comments (skin integrity, edema, etc.): Pt's son present for some of treatment. He reports  pt recently had a laxative, therefore we only ambulated in the room so as to be near a bathroom if needed      Pertinent Vitals/Pain Pain Assessment Pain Assessment: 0-10 Pain Score: 0-No pain Pain Descriptors / Indicators:  (Pt reports no pain at present due to getting pain meds recently)    Home Living                          Prior Function            PT Goals (current goals can now be found in the care plan section) Progress  towards PT goals: Progressing toward goals    Frequency    Min 3X/week      PT Plan Current plan remains appropriate    Co-evaluation              AM-PAC PT "6 Clicks" Mobility   Outcome Measure  Help needed turning from your back to your side while in a flat bed without using bedrails?: None Help needed moving from lying on your back to sitting on the side of a flat bed without using bedrails?: A Little Help needed moving to and from a bed to a chair (including a wheelchair)?: A Little Help needed standing up from a chair using your arms (e.g., wheelchair or bedside chair)?: A Little Help needed to walk in hospital room?: A Little Help needed climbing 3-5 steps with a railing? : A Lot 6 Click Score: 18    End of Session Equipment Utilized During Treatment: Gait belt Activity Tolerance: Patient tolerated treatment well Patient left: in chair;with chair alarm set;with family/visitor present Nurse Communication: Mobility status PT Visit Diagnosis: Unsteadiness on feet (R26.81);Other abnormalities of gait and mobility (R26.89);History of falling (Z91.81)     Time: 1422 -1446    Charges:                        Lavona Mound, PT   Acute Rehabilitation Services  Office [phone] 02/26/2023    Donnella Sham 02/26/2023, 3:05 PM

## 2023-02-26 NOTE — Progress Notes (Signed)
PROGRESS NOTE    Walter [name]  ZOX:[id number] DOB: [birth year] DOA: 02/21/2023 PCP: Kristian Covey, MD   Brief Narrative:    Walter [name] is a 77 y.o. male with medical history significant of hypertension, hyperlipidemia, T2DM, GERD, dementia who presented to the emergency department on 4/22 for evaluation of abnormal aortic aneurysm that was seen on outpatient x-ray that was done for 2 weeks of lower back pain after a fall.  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  Images that extended into the lower chest noted small subsegmental incidental pulmonary embolus in right lower lobe.  Patient had initially presented to [facility] emergency room in [place] and transferred to [facility] on night of 4/23.  Vascular surgery formally consulted.  Assessment & Plan:   Principal Problem:   Abdominal aortic aneurysm (HCC) Active Problems:   Essential hypertension   GERD (gastroesophageal reflux disease)   Acute pulmonary embolism (HCC)   Closed compression fracture of L1 lumbar vertebra, initial encounter (HCC)   Recurrent falls   Hypoalbuminemia due to protein-calorie malnutrition (HCC)   Mixed hyperlipidemia   Prediabetes   Dementia without behavioral disturbance (HCC)   Normocytic anemia   Protein-calorie malnutrition, severe (HCC)  Assessment and Plan:   Abdominal aortic aneurysm enlarged, close to 6 cm.  Patient seen by vascular surgery who recommended no immediate intervention.  Suspect he will likely need surgery, although it is not urgent and likely has been there for some time.  His more immediate issues are his pulmonary embolus.    Subsegmental pulmonary embolism Patient was started on IV heparin drip.  Suspect etiology is after lower back pain issues, patient reportedly almost nonmobile for the past 2 weeks.  However prior to that, quite  ambulatory.  Lower extremity Dopplers confirm clot in right lower extremity.  Patient transition from heparin to Xarelto.  Left thigh hematoma: Patient started having some severe left thigh pain on evening of 4/25.  Patient underwent MRI of lumbar spine which also noted left thigh hematoma.  Looks to be self-contained is, as patient is no longer having any thigh discomfort and hemoglobin has remained stable.  Acute anemia Patient noted to be on iron and B12 supplementation at home.  Noted MCV of 77, and iron panel with normal TIBC, but low iron and ferritin, from likely some component of chronic disease.  Will add iron supplement.  Hemoglobin has remained stable Status post 1 unit PRBC transfusion with no overt bleeding noted   Age-indeterminate L1 compression fracture in the setting of recurrent falls Continue fall precautions.  Given his previous active lifestyle, feel like he is good a good candidate for anticoagulation.  Seen by PT and OT who are recommending skilled nursing, will plan to transfer on Monday.  MRI notes acute/subacute L1 compression fracture.  Interventional radiology consulted for evaluation for possible kyphoplasty.  Have added as needed Oxy IR   Severe protein calorie malnutrition: According to family, patient's p.o. intake has been declining over the past year.  Nutrition Status: Nutrition Problem: Severe Malnutrition Etiology: chronic illness Signs/Symptoms: severe fat depletion, moderate fat depletion, severe muscle depletion, energy intake < or equal to 75% for > or equal to 1 month Interventions: Ensure Enlive (each supplement provides 350kcal and 20 grams of protein), MVI, Liberalize Diet Appreciate nutrition help  Constipation: Trying some lactulose.  Oral thrush: Magic mouthwash  Essential hypertension Continue amlodipine, losartan   Prediabetes Hemoglobin A1c on 12/08/2022 was 5.8 Continue diet and lifestyle modification at this time   Mixed  hyperlipidemia Continue Lipitor   GERD Continue Protonix   Dementia Continue Aricept, Namenda   DVT prophylaxis: Heparin drip Code Status: DNR Family Communication: Wife at bedside Disposition Plan:  Status is: Inpatient -MRI for lower back -Needs skilled nursing   Consultants:  Vascular surgery-Dr. Randie Heinz  Procedures:  1 unit packed red blood cell transfusion Lower extremity Dopplers noting right lower extremity DVT  Antimicrobials:  None   Subjective: Complains of more back pain today.  Objective: Vitals:   02/25/23 1613 02/25/23 1954 02/26/23 0413 02/26/23 0746  BP:  (!) 156/61 (!) 155/68 (!) 158/68  Pulse: 86 (!) 52 91 99  Resp:  14 14 17   Temp:  98.1 F (36.7 C) 98 F (36.7 C) 97.9 F (36.6 C)  TempSrc:  Oral Oral Oral  SpO2: 96% 96% 97% 100%  Weight:      Height:        Intake/Output Summary (Last 24 hours) at 02/26/2023 1252 Last data filed at 02/26/2023 0413 Gross per 24 hour  Intake 125 ml  Output 650 ml  Net -525 ml    Filed Weights   02/21/23 1159  Weight: 64.9 kg    Examination:  General exam: Appears calm and comfortable, no acute distress, oriented x 2 HEENT: Mucous membranes are dry, some mild thrush patch on tongue-which has improved Respiratory system: Clear to auscultation. Respiratory effort normal. Cardiovascular system: S1 & S2 heard, RRR.  Gastrointestinal system: Abdomen is soft, nontender, nondistended, positive bowel sounds Central nervous system: No focal deficits Extremities: No edema Skin: No significant lesions noted Psychiatry: Flat affect.  Appropriate    Data Reviewed: I have personally reviewed following labs and imaging studies White blood cell count at 10.8.  Hemoglobin at 8.0.    Radiology Studies: VAS [place] LOWER EXTREMITY VENOUS (DVT)  Result Date: 02/25/2023  Lower Venous DVT Study Patient Name:  Walter [name]  Date of Exam:   02/24/2023 Medical Rec #: [id number]           Accession #:    [id number]  Date of Birth: 11-13-1945            Patient Gender: M Patient Age:   59 years Exam Location:  [facility] Procedure:      VAS [place] LOWER EXTREMITY VENOUS (DVT) Referring Phys: [name] --------------------------------------------------------------------------------  Indications: Pulmonary embolism.  Limitations: Acoustic shadowing from heavily calcified arteries. Comparison Study: No prior study Performing Technologist: Sherren Kerns RVS  Examination Guidelines: A complete evaluation includes B-mode imaging, spectral Doppler, color Doppler, and power Doppler as needed of all accessible portions of each vessel. Bilateral testing is considered an integral part of a complete examination. Limited examinations for reoccurring indications may be performed as noted. The reflux portion of the exam is performed with the patient in reverse Trendelenburg.  +---------+---------------+---------+-----------+----------+--------------+ RIGHT    CompressibilityPhasicitySpontaneityPropertiesThrombus Aging +---------+---------------+---------+-----------+----------+--------------+ CFV      Full           Yes      Yes                                 +---------+---------------+---------+-----------+----------+--------------+ SFJ      Full                                                        +---------+---------------+---------+-----------+----------+--------------+  FV Prox  Full                                                        +---------+---------------+---------+-----------+----------+--------------+ FV Mid   Full           Yes      Yes                                 +---------+---------------+---------+-----------+----------+--------------+ FV DistalFull                                                        +---------+---------------+---------+-----------+----------+--------------+ PFV      Full                                                         +---------+---------------+---------+-----------+----------+--------------+ POP      Full           Yes      Yes                                 +---------+---------------+---------+-----------+----------+--------------+ PTV      None                                         Acute          +---------+---------------+---------+-----------+----------+--------------+ PERO     Full                                                        +---------+---------------+---------+-----------+----------+--------------+   +---------+---------------+---------+-----------+----------+--------------+ LEFT     CompressibilityPhasicitySpontaneityPropertiesThrombus Aging +---------+---------------+---------+-----------+----------+--------------+ CFV      Full           Yes      Yes                                 +---------+---------------+---------+-----------+----------+--------------+ SFJ      Full                                                        +---------+---------------+---------+-----------+----------+--------------+ FV Prox  Full                                                        +---------+---------------+---------+-----------+----------+--------------+  FV Mid   Full           Yes      Yes                                 +---------+---------------+---------+-----------+----------+--------------+ FV DistalFull                                                        +---------+---------------+---------+-----------+----------+--------------+ PFV      Full                                                        +---------+---------------+---------+-----------+----------+--------------+ POP      Full           Yes      Yes                                 +---------+---------------+---------+-----------+----------+--------------+ PTV      Full                                                         +---------+---------------+---------+-----------+----------+--------------+ PERO     Full                                                        +---------+---------------+---------+-----------+----------+--------------+     Summary: BILATERAL: -No evidence of popliteal cyst, bilaterally. RIGHT: - Findings consistent with acute deep vein thrombosis involving the right posterior tibial veins.  LEFT: - There is no evidence of deep vein thrombosis in the lower extremity.  *See table(s) above for measurements and observations. Electronically signed by Gerarda Fraction on 02/25/2023 at 1:42:14 PM.    Final    MR LUMBAR SPINE WO CONTRAST  Result Date: 02/25/2023 CLINICAL DATA:  Compression fracture EXAM: MRI LUMBAR SPINE WITHOUT CONTRAST TECHNIQUE: Multiplanar, multisequence MR imaging of the lumbar spine was performed. No intravenous contrast was administered. COMPARISON:  No prior MRI of the lumbar spine available, correlation is made with CT abdomen pelvis 02/21/2023 and 02/16/2023 lumbar spine radiograph FINDINGS: Segmentation:  5 lumbar type vertebral bodies. Alignment:  Levocurvature of the lumbar spine.  No listhesis. Vertebrae: As seen on the prior CT and radiograph, there is a T1 compression fracture, with up to 70% vertebral body height loss and 3 mm retropulsion of posterior cortex centrally. Within the vertebral body there is a linear focus of marked T2 hyperintense signal. No other acute fracture, suspicious osseous lesion, or evidence of discitis. Conus medullaris and cauda equina: Conus extends to the L1 level. Conus and cauda equina appear normal. Paraspinal and other soft tissues: Fluid collection in the left psoas muscle, which measures up to 4.0 x 3.6 cm in the axial plane (  series 5, image 18) and extends from approximately the level of L1 to L4; some of the fluid is T1 hypointense and T2 heterogeneous. This was not evident on the prior CT. No other acute process. Disc levels: T12-L1: No  significant disc bulge. Mild retropulsion of the posterosuperior cortex of L1. No spinal canal stenosis or neural foraminal narrowing. L1-L2: Minimal disc bulge with left foraminal and extreme lateral disc protrusion. Mild facet arthropathy. No spinal canal stenosis or neural foraminal narrowing. L2-L3: No significant disc bulge. Mild facet arthropathy. No spinal canal stenosis or neural foraminal narrowing. L3-L4: Minimal disc bulge. Mild facet arthropathy. Narrowing of the lateral recesses. No spinal canal stenosis or neural foraminal narrowing. L4-L5: Minimal disc bulge. Mild facet arthropathy. No spinal canal stenosis. No neural foraminal narrowing. L5-S1: No significant disc bulge. No spinal canal stenosis or neural foraminal narrowing. IMPRESSION: 1. Acute to subacute L1 compression fracture, with up to 70% vertebral body height loss and 3 mm retropulsion of the posterior cortex. No spinal canal stenosis or neural foraminal narrowing at this level. 2. Fluid collection in the left psoas muscle, which was not evident on the prior CT and likely represents a hematoma. 3. No spinal canal stenosis or neural foraminal narrowing. Narrowing of the lateral recesses at L3-L4 could affect the descending L4 nerve roots. Electronically Signed   By: Wiliam Ke M.D.   On: 02/25/2023 13:01   VAS [place] ABI WITH/WO TBI  Result Date: 02/25/2023  LOWER EXTREMITY DOPPLER STUDY Patient Name:  Walter [name]  Date of Exam:   02/24/2023 Medical Rec #: [id number]           Accession #:    [id number] Date of Birth: [birth year]            Patient Gender: M Patient Age:   55 years Exam Location:  [facility] Procedure:      VAS [place] ABI WITH/WO TBI Referring Phys: [name] --------------------------------------------------------------------------------  Indications: Right EIA and CFA occlusion by CTA of abdomen High Risk         Hypertension, hyperlipidemia, Diabetes, coronary artery Factors:          disease. Other Factors:  AAA.  Comparison Study: No prior study on file Performing Technologist: Sherren Kerns RVS  Examination Guidelines: A complete evaluation includes at minimum, Doppler waveform signals and systolic blood pressure reading at the level of bilateral brachial, anterior tibial, and posterior tibial arteries, when vessel segments are accessible. Bilateral testing is considered an integral part of a complete examination. Photoelectric Plethysmograph (PPG) waveforms and toe systolic pressure readings are included as required and additional duplex testing as needed. Limited examinations for reoccurring indications may be performed as noted.  ABI Findings: +---------+------------------+-----+-----------+--------+ Right    Rt Pressure (mmHg)IndexWaveform   Comment  +---------+------------------+-----+-----------+--------+ Brachial 157                    triphasic           +---------+------------------+-----+-----------+--------+ PTA      129               0.82 multiphasic         +---------+------------------+-----+-----------+--------+ DP       111               0.71 multiphasic         +---------+------------------+-----+-----------+--------+ Great Toe93                0.59 Abnormal            +---------+------------------+-----+-----------+--------+ +---------+------------------+-----+-----------+-------+  Left     Lt Pressure (mmHg)IndexWaveform   Comment +---------+------------------+-----+-----------+-------+ Brachial 150                    triphasic          +---------+------------------+-----+-----------+-------+ PTA      172               1.10 multiphasic        +---------+------------------+-----+-----------+-------+ DP       168               1.07 multiphasic        +---------+------------------+-----+-----------+-------+ Great Toe102               0.65 Abnormal           +---------+------------------+-----+-----------+-------+  +-------+-----------+-----------+------------+------------+ ABI/TBIToday's ABIToday's TBIPrevious ABIPrevious TBI +-------+-----------+-----------+------------+------------+ Right  0.82       0.59                                +-------+-----------+-----------+------------+------------+ Left   1.10       0.65                                +-------+-----------+-----------+------------+------------+   Summary: Right: Resting right ankle-brachial index indicates mild right lower extremity arterial disease. The right toe-brachial index is abnormal. Left: Resting left ankle-brachial index is within normal range. The left toe-brachial index is abnormal. *See table(s) above for measurements and observations.  Electronically signed by Gerarda Fraction on 02/25/2023 at 9:03:52 AM.    Final    DG Abd Portable 1V  Result Date: 02/24/2023 CLINICAL DATA:  Ileus.  Nausea and vomiting. EXAM: PORTABLE ABDOMEN - 1 VIEW COMPARISON:  CT 02/21/2023 FINDINGS: Gas and stool throughout the colon. No small or large bowel distention. No radiopaque stones. Vascular calcifications with curvilinear calcification suggesting aortic aneurysm. Aneurysm was confirmed at prior CT. See additional report. Degenerative changes in the spine and hips. Calcified phleboliths in the pelvis. IMPRESSION: Nonobstructive bowel gas pattern. Vascular calcification with abdominal aortic aneurysm. See additional report of prior CT. Electronically Signed   By: Burman Nieves M.D.   On: 02/24/2023 21:48        Scheduled Meds:  (feeding supplement) PROSource Plus  30 mL Oral Daily   sodium chloride   Intravenous Once   amLODipine  5 mg Oral Daily   atorvastatin  40 mg Oral Daily   cyanocobalamin  2,000 mcg Oral Daily   docusate sodium  100 mg Oral BID   donepezil  10 mg Oral QHS   feeding supplement  1 Container Oral Q24H   feeding supplement  237 mL Oral BID BM   ferrous sulfate  325 mg Oral Q breakfast   insulin aspart  0-9  Units Subcutaneous TID WC   lidocaine  1 patch Transdermal Q24H   losartan  100 mg Oral Daily   magic mouthwash  5 mL Oral TID   memantine  10 mg Oral BID   multivitamin with minerals  1 tablet Oral Daily   pantoprazole  40 mg Oral Daily   polyethylene glycol  17 g Oral Daily   Rivaroxaban  15 mg Oral BID WC   Followed by   Melene Muller ON 03/17/2023] rivaroxaban  20 mg Oral Q supper   Continuous Infusions:     LOS: 5 days  Hollice Espy, MD [facility]  If 7PM-7AM, please contact night-coverage [email or web address] 02/26/2023, 12:52 PM

## 2023-02-27 DIAGNOSIS — F0390 Unspecified dementia without behavioral disturbance: Secondary | ICD-10-CM | POA: Diagnosis not present

## 2023-02-27 DIAGNOSIS — I714 Abdominal aortic aneurysm, without rupture, unspecified: Secondary | ICD-10-CM | POA: Diagnosis not present

## 2023-02-27 DIAGNOSIS — I2699 Other pulmonary embolism without acute cor pulmonale: Secondary | ICD-10-CM | POA: Diagnosis not present

## 2023-02-27 DIAGNOSIS — S32010A Wedge compression fracture of first lumbar vertebra, initial encounter for closed fracture: Secondary | ICD-10-CM | POA: Diagnosis not present

## 2023-02-27 LAB — CBC
HCT: 25.9 % — ABNORMAL LOW (ref 39.0–52.0)
Hemoglobin: 7.5 g/dL — ABNORMAL LOW (ref 13.0–17.0)
MCH: 23.4 pg — ABNORMAL LOW (ref 26.0–34.0)
MCHC: 29 g/dL — ABNORMAL LOW (ref 30.0–36.0)
MCV: 80.7 fL (ref 80.0–100.0)
Platelets: 298 10*3/uL (ref 150–400)
RBC: 3.21 MIL/uL — ABNORMAL LOW (ref 4.22–5.81)
RDW: 18.6 % — ABNORMAL HIGH (ref 11.5–15.5)
WBC: 10.9 10*3/uL — ABNORMAL HIGH (ref 4.0–10.5)
nRBC: 0 % (ref 0.0–0.2)

## 2023-02-27 LAB — BASIC METABOLIC PANEL
Anion gap: 8 (ref 5–15)
BUN: 22 mg/dL (ref 8–23)
CO2: 26 mmol/L (ref 22–32)
Calcium: 8.3 mg/dL — ABNORMAL LOW (ref 8.9–10.3)
Chloride: 101 mmol/L (ref 98–111)
Creatinine, Ser: 1.12 mg/dL (ref 0.61–1.24)
GFR, Estimated: >60 mL/min (ref >60)
Glucose, Bld: 108 mg/dL — ABNORMAL HIGH (ref 70–99)
Potassium: 3.8 mmol/L (ref 3.5–5.1)
Sodium: 135 mmol/L (ref 135–145)

## 2023-02-27 LAB — GLUCOSE, CAPILLARY
Glucose-Capillary: 118 mg/dL — ABNORMAL HIGH (ref 70–99)
Glucose-Capillary: 128 mg/dL — ABNORMAL HIGH (ref 70–99)
Glucose-Capillary: 250 mg/dL — ABNORMAL HIGH (ref 70–99)
Glucose-Capillary: 188 mg/dL — ABNORMAL HIGH (ref 70–99)
Glucose-Capillary: 205 mg/dL — ABNORMAL HIGH (ref 70–99)

## 2023-02-27 MED ORDER — MEGESTROL ACETATE 20 MG PO TABS
20.0000 mg | ORAL_TABLET | Freq: Two times a day (BID) | ORAL | Status: DC
  Administered 2023-02-27 – 2023-03-10 (×22): 20 mg via ORAL
  Filled 2023-02-27 (×26): qty 1

## 2023-02-27 NOTE — Progress Notes (Signed)
PROGRESS NOTE    Walter [name]  ZOX:[id number] DOB: May 29, 1946 DOA: 02/21/2023 PCP: Kristian Covey, MD   Brief Narrative:    Walter [name] is a 77 y.o. male with medical history significant of hypertension, hyperlipidemia, T2DM, GERD, dementia who presented to the emergency department on 4/22 for evaluation of abnormal aortic aneurysm that was seen on outpatient x-ray that was done for 2 weeks of lower back pain after a fall.  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  Images that extended into the lower chest noted small subsegmental incidental pulmonary embolus in right lower lobe.  Patient had initially presented to [facility] emergency room in [place] and transferred to [facility] on night of 4/23.  Vascular surgery formally consulted.  Assessment & Plan:   Principal Problem:   Abdominal aortic aneurysm (HCC) Active Problems:   Essential hypertension   GERD (gastroesophageal reflux disease)   Acute pulmonary embolism (HCC)   Closed compression fracture of L1 lumbar vertebra, initial encounter (HCC)   Recurrent falls   Hypoalbuminemia due to protein-calorie malnutrition (HCC)   Mixed hyperlipidemia   Prediabetes   Dementia without behavioral disturbance (HCC)   Normocytic anemia   Protein-calorie malnutrition, severe (HCC)  Assessment and Plan:   Abdominal aortic aneurysm enlarged, close to 6 cm.  Patient seen by vascular surgery who recommended no immediate intervention.  Suspect he will likely need surgery, although it is not urgent and likely has been there for some time.  His more immediate issues are his pulmonary embolus.    Subsegmental pulmonary embolism Patient was started on IV heparin drip.  Suspect etiology is after lower back pain issues, patient reportedly almost nonmobile for the past 2 weeks.  However prior to that, quite  ambulatory.  Lower extremity Dopplers confirm clot in right lower extremity.  Patient transition from heparin to Xarelto.  Left thigh hematoma: Patient started having some severe left thigh pain on evening of 4/25.  Patient underwent MRI of lumbar spine which also noted left thigh hematoma.  Looks to be self-contained is, as patient is no longer having any thigh discomfort and hemoglobin has remained stable.  Patient's hemoglobin slightly lower from previous day, currently at 7.5.  Patient not hypotensive, but will continue to monitor.  In the end, if we do feel that he is having some continued bleeding, will have to discontinue Xarelto and place IVC filter  Acute anemia Patient noted to be on iron and B12 supplementation at home.  Noted MCV of 77, and iron panel with normal TIBC, but low iron and ferritin, from likely some component of chronic disease.  Will add iron supplement.  Hemoglobin has remained stable Status post 1 unit PRBC transfusion with no overt bleeding noted.  Slight drop today at 7.5.  Transfuse for hemoglobin below 7.   Age-indeterminate L1 compression fracture in the setting of recurrent falls Continue fall precautions.  Given his previous active lifestyle, feel like he is good a good candidate for anticoagulation.  Seen by PT and OT who are recommending skilled nursing, will plan to transfer on Monday.  MRI notes acute/subacute L1 compression fracture.  Interventional radiology consulted for evaluation for possible kyphoplasty.  Have added as needed Oxy IR   Severe protein calorie malnutrition: According to family, patient's p.o. intake has been declining over the past year.  Nutrition Status: Nutrition Problem: Severe Malnutrition  Etiology: chronic illness Signs/Symptoms: severe fat depletion, moderate fat depletion, severe muscle depletion, energy intake < or equal to 75% for > or equal to 1 month Interventions: Ensure Enlive (each supplement provides 350kcal and 20 grams of  protein), MVI, Liberalize Diet Appreciate nutrition help, adding Megace  Constipation: Responded well to lactulose  Oral thrush: Magic mouthwash   Essential hypertension Continue amlodipine, losartan   Prediabetes Hemoglobin A1c on 12/08/2022 was 5.8 Continue diet and lifestyle modification at this time   Mixed hyperlipidemia Continue Lipitor   GERD Continue Protonix   Dementia Continue Aricept, Namenda   DVT prophylaxis: Heparin drip Code Status: DNR Family Communication: Wife at bedside Disposition Plan:  Status is: Inpatient -MRI for lower back -Needs skilled nursing   Consultants:  Vascular surgery-Dr. Randie Heinz Interventional radiology  Procedures:  1 unit packed red blood cell transfusion Lower extremity Dopplers noting right lower extremity DVT Possible kyphoplasty  Antimicrobials:  None   Subjective: Was complaining of back pain earlier.  Objective: Vitals:   02/26/23 1612 02/26/23 2030 02/27/23 0407 02/27/23 0755  BP: 115/67 124/60 (!) 144/70 134/66  Pulse: 90 87 88 92  Resp: 17 16 15 17   Temp: 97.7 F (36.5 C) 97.9 F (36.6 C) 97.8 F (36.6 C) 98.2 F (36.8 C)  TempSrc: Oral Oral  Oral  SpO2: 98% 99% 97% 97%  Weight:      Height:        Intake/Output Summary (Last 24 hours) at 02/27/2023 1320 Last data filed at 02/27/2023 1018 Gross per 24 hour  Intake 220 ml  Output 700 ml  Net -480 ml    Filed Weights   02/21/23 1159  Weight: 64.9 kg    Examination:  General exam: Appears calm and comfortable, no acute distress, oriented x 2 HEENT: Mucous membranes are dry, some mild thrush patch on tongue-which has improved Respiratory system: Clear to auscultation. Respiratory effort normal. Cardiovascular system: S1 & S2 heard, RRR.  Gastrointestinal system: Abdomen is soft, nontender, nondistended, positive bowel sounds Central nervous system: No focal deficits Extremities: No edema Skin: No significant lesions noted Psychiatry: Flat  affect.  Appropriate    Data Reviewed: I have personally reviewed following labs and imaging studies Hemoglobin at 7.5.    Radiology Studies: No results found.      Scheduled Meds:  (feeding supplement) PROSource Plus  30 mL Oral Daily   sodium chloride   Intravenous Once   amLODipine  5 mg Oral Daily   atorvastatin  40 mg Oral Daily   cyanocobalamin  2,000 mcg Oral Daily   docusate sodium  100 mg Oral BID   donepezil  10 mg Oral QHS   feeding supplement  1 Container Oral Q24H   feeding supplement  237 mL Oral BID BM   ferrous sulfate  325 mg Oral Q breakfast   insulin aspart  0-9 Units Subcutaneous TID WC   lidocaine  1 patch Transdermal Q24H   losartan  100 mg Oral Daily   magic mouthwash  5 mL Oral TID   megestrol  20 mg Oral BID   memantine  10 mg Oral BID   multivitamin with minerals  1 tablet Oral Daily   pantoprazole  40 mg Oral Daily   polyethylene glycol  17 g Oral Daily   Rivaroxaban  15 mg Oral BID WC   Followed by   Melene Muller ON 03/17/2023] rivaroxaban  20 mg Oral Q supper   Continuous Infusions:     LOS: 6 days  Hollice Espy, MD [facility]  If 7PM-7AM, please contact night-coverage [email or web address] 02/27/2023, 1:20 PM

## 2023-02-27 NOTE — Progress Notes (Signed)
Mobility Specialist: Progress Note   02/27/23 1719  Mobility  Activity Ambulated with assistance in hallway  Level of Assistance Contact guard assist, steadying assist  Assistive Device Front wheel walker  Distance Ambulated (ft) 140 ft  Activity Response Tolerated well  Mobility Referral Yes  $Mobility charge 1 Mobility   Pt received in the bed and agreeable to mobility. MinA with bed mobility and contact guard during ambulation. Bilateral knee flexion during ambulation requiring verbal cues to correct throughout. No c/o during session. Pt back to bed after session with call bell and phone at his side. Bed alarm is on.   Batool Majid Mobility Specialist Please contact via SecureChat or Rehab office at [phone]

## 2023-02-28 DIAGNOSIS — I2699 Other pulmonary embolism without acute cor pulmonale: Secondary | ICD-10-CM | POA: Diagnosis not present

## 2023-02-28 DIAGNOSIS — I714 Abdominal aortic aneurysm, without rupture, unspecified: Secondary | ICD-10-CM | POA: Diagnosis not present

## 2023-02-28 DIAGNOSIS — S32010A Wedge compression fracture of first lumbar vertebra, initial encounter for closed fracture: Secondary | ICD-10-CM | POA: Diagnosis not present

## 2023-02-28 DIAGNOSIS — F0390 Unspecified dementia without behavioral disturbance: Secondary | ICD-10-CM | POA: Diagnosis not present

## 2023-02-28 LAB — GLUCOSE, CAPILLARY
Glucose-Capillary: 98 mg/dL (ref 70–99)
Glucose-Capillary: 117 mg/dL — ABNORMAL HIGH (ref 70–99)
Glucose-Capillary: 126 mg/dL — ABNORMAL HIGH (ref 70–99)
Glucose-Capillary: 155 mg/dL — ABNORMAL HIGH (ref 70–99)
Glucose-Capillary: 153 mg/dL — ABNORMAL HIGH (ref 70–99)
Glucose-Capillary: 128 mg/dL — ABNORMAL HIGH (ref 70–99)

## 2023-02-28 LAB — CBC
HCT: 26.2 % — ABNORMAL LOW (ref 39.0–52.0)
Hemoglobin: 7.9 g/dL — ABNORMAL LOW (ref 13.0–17.0)
MCH: 24.2 pg — ABNORMAL LOW (ref 26.0–34.0)
MCHC: 30.2 g/dL (ref 30.0–36.0)
MCV: 80.4 fL (ref 80.0–100.0)
Platelets: 298 10*3/uL (ref 150–400)
RBC: 3.26 MIL/uL — ABNORMAL LOW (ref 4.22–5.81)
RDW: 19.2 % — ABNORMAL HIGH (ref 11.5–15.5)
WBC: 9.6 10*3/uL (ref 4.0–10.5)
nRBC: 0 % (ref 0.0–0.2)

## 2023-02-28 LAB — BASIC METABOLIC PANEL
Anion gap: 10 (ref 5–15)
BUN: 22 mg/dL (ref 8–23)
CO2: 26 mmol/L (ref 22–32)
Calcium: 8.8 mg/dL — ABNORMAL LOW (ref 8.9–10.3)
Chloride: 101 mmol/L (ref 98–111)
Creatinine, Ser: 1.18 mg/dL (ref 0.61–1.24)
GFR, Estimated: >60 mL/min (ref >60)
Glucose, Bld: 129 mg/dL — ABNORMAL HIGH (ref 70–99)
Potassium: 4.1 mmol/L (ref 3.5–5.1)
Sodium: 137 mmol/L (ref 135–145)

## 2023-02-28 MED ORDER — HEPARIN (PORCINE) 25000 UT/250ML-% IV SOLN
1200.0000 [IU]/h | INTRAVENOUS | Status: DC
  Administered 2023-02-28: 850 [IU]/h via INTRAVENOUS
  Administered 2023-03-01: 1150 [IU]/h via INTRAVENOUS
  Filled 2023-02-28 (×2): qty 250

## 2023-02-28 NOTE — Progress Notes (Signed)
PROGRESS NOTE    Walter [name]  ZOX:[id number] DOB: 1946-10-08 DOA: 02/21/2023 PCP: Kristian Covey, MD   Brief Narrative:    Walter [name] is a 77 y.o. male with medical history significant of hypertension, hyperlipidemia, T2DM, GERD, dementia who presented to the emergency department on 4/22 for evaluation of abnormal aortic aneurysm that was seen on outpatient x-ray that was done for 2 weeks of lower back pain after a fall.  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  Images that extended into the lower chest noted small subsegmental incidental pulmonary embolus in right lower lobe.  Patient had initially presented to [facility] emergency room in [place] and transferred to [facility] on night of 4/23.  Vascular surgery formally consulted.  Assessment & Plan:   Principal Problem:   Abdominal aortic aneurysm (HCC) Active Problems:   Essential hypertension   GERD (gastroesophageal reflux disease)   Acute pulmonary embolism (HCC)   Closed compression fracture of L1 lumbar vertebra, initial encounter (HCC)   Recurrent falls   Hypoalbuminemia due to protein-calorie malnutrition (HCC)   Mixed hyperlipidemia   Prediabetes   Dementia without behavioral disturbance (HCC)   Normocytic anemia   Protein-calorie malnutrition, severe (HCC)  Assessment and Plan:   Abdominal aortic aneurysm enlarged, close to 6 cm.  Patient seen by vascular surgery who recommended no immediate intervention.  Suspect he will likely need surgery, although it is not urgent and likely has been there for some time.  His more immediate issues are his pulmonary embolus.    Subsegmental pulmonary embolism Patient was started on IV heparin drip.  Suspect etiology is after lower back pain issues, patient reportedly almost nonmobile for the past 2 weeks.  However prior to that, quite  ambulatory.  Lower extremity Dopplers confirm clot in right lower extremity.  Patient transition from heparin to Xarelto.  In discussion with interventional radiology, have switched back to heparin for now  Left thigh hematoma: Patient started having some severe left thigh pain on evening of 4/25.  Patient underwent MRI of lumbar spine which also noted left thigh hematoma.  Looks to be self-contained is, as patient is no longer having any thigh discomfort and hemoglobin has remained stable.  Patient's hemoglobin slightly up from previous day, currently at 7.9.  Patient not hypotensive, but will continue to monitor.  In the end, if we do feel that he is having some continued bleeding, will have to discontinue Xarelto and place IVC filter.    Acute anemia Patient noted to be on iron and B12 supplementation at home.  Noted MCV of 77, and iron panel with normal TIBC, but low iron and ferritin, from likely some component of chronic disease.  Will add iron supplement.  Hemoglobin has remained stable Status post 1 unit PRBC transfusion with no overt bleeding noted.  Slight drop today at 7.5.  Transfuse for hemoglobin below 7.   Age-indeterminate L1 compression fracture in the setting of recurrent falls Continue fall precautions.  Given his previous active lifestyle, feel like he is good a good candidate for anticoagulation.  Seen by PT and OT who are recommending skilled nursing, will plan to transfer on Monday.  MRI notes acute/subacute L1 compression fracture.  Interventional radiology feel patient would be a good candidate for kyphoplasty.  Currently waiting for insurance approval for procedure.  Have added as needed Oxy IR.  I in anticipation of procedure, I have changed Xarelto back to heparin infusion   Severe protein calorie malnutrition: According to family, patient's p.o. intake has been declining over the past year.  Nutrition Status: Nutrition Problem: Severe Malnutrition Etiology: chronic  illness Signs/Symptoms: severe fat depletion, moderate fat depletion, severe muscle depletion, energy intake < or equal to 75% for > or equal to 1 month Interventions: Ensure Enlive (each supplement provides 350kcal and 20 grams of protein), MVI, Liberalize Diet Appreciate nutrition help, adding Megace  Constipation: Responded well to lactulose  Oral thrush: Magic mouthwash   Essential hypertension Continue amlodipine, losartan   Prediabetes Hemoglobin A1c on 12/08/2022 was 5.8 Continue diet and lifestyle modification at this time   Mixed hyperlipidemia Continue Lipitor   GERD Continue Protonix   Dementia Continue Aricept, Namenda   DVT prophylaxis: Heparin drip Code Status: DNR Family Communication: Wife at bedside Disposition Plan:  Status is: Inpatient -Kyphoplasty -Completed peer to peer and patient approved for skilled nursing   Consultants:  Vascular surgery-Dr. Randie Heinz Interventional radiology  Procedures:  1 unit packed red blood cell transfusion Lower extremity Dopplers noting right lower extremity DVT Possible kyphoplasty  Antimicrobials:  None   Subjective: Some mild back pain, bowels not moved x 1 day  Objective: Vitals:   02/27/23 1640 02/27/23 2030 02/27/23 2032 02/28/23 0438  BP: (!) 106/59  (!) 126/58 (!) 141/74  Pulse: 81 78 79 79  Resp: 16  16 16   Temp: 97.9 F (36.6 C)  98.2 F (36.8 C) 97.6 F (36.4 C)  TempSrc: Oral  Oral Oral  SpO2: 97% 98% 99% 99%  Weight:    61.9 kg  Height:        Intake/Output Summary (Last 24 hours) at 02/28/2023 1422 Last data filed at 02/28/2023 0440 Gross per 24 hour  Intake --  Output 800 ml  Net -800 ml    Filed Weights   02/21/23 1159 02/28/23 0438  Weight: 64.9 kg 61.9 kg    Examination:  General exam: Appears calm and comfortable, no acute distress, oriented x 2 HEENT: Mucous membranes are dry, some mild thrush patch on tongue-which has improved Respiratory system: Clear to auscultation.  Respiratory effort normal. Cardiovascular system: S1 & S2 heard, RRR.  Gastrointestinal system: Abdomen is soft, nontender, nondistended, positive bowel sounds Central nervous system: No focal deficits Extremities: No edema Skin: No significant lesions noted Psychiatry: Flat affect.  Appropriate    Data Reviewed: I have personally reviewed following labs and imaging studies Hemoglobin at 7.9    Radiology Studies: No results found.      Scheduled Meds:  (feeding supplement) PROSource Plus  30 mL Oral Daily   sodium chloride   Intravenous Once   amLODipine  5 mg Oral Daily   atorvastatin  40 mg Oral Daily   cyanocobalamin  2,000 mcg Oral Daily   docusate sodium  100 mg Oral BID   donepezil  10 mg Oral QHS   feeding supplement  1 Container Oral Q24H   feeding supplement  237 mL Oral BID BM   ferrous sulfate  325 mg Oral Q breakfast   insulin aspart  0-9 Units Subcutaneous TID WC   lidocaine  1 patch Transdermal Q24H   losartan  100 mg Oral Daily   magic mouthwash  5 mL Oral TID   megestrol  20 mg Oral BID   memantine  10 mg Oral BID   multivitamin with minerals  1 tablet Oral Daily   pantoprazole  40 mg Oral Daily   polyethylene glycol  17 g Oral Daily   Continuous Infusions:  heparin        LOS: 7 days       Hollice Espy, MD [facility]  If 7PM-7AM, please contact night-coverage [email or web address] 02/28/2023, 2:22 PM

## 2023-02-28 NOTE — TOC Progression Note (Addendum)
Transition of Care [facility]) - Progression Note    Patient Details  Name: Walter [name] MRN: [id number] Date of Birth: 1946-03-12  Transition of Care [facility]) CM/SW Contact  Carley Hammed, LCSW Phone Number: 02/28/2023, 10:05 AM  Clinical Narrative:     CSW notified that [facility] is offering a P2P for Mr Garry due @ 12 noon [phone] opt#3. MD advised and scheduled a time to complete. TOC will continue to follow for DC needs.   CSW met with pt and spouse at bedside and advised of next steps in insurance auth process. Family asking about an authorization for his back surgery, CSW unaware of this. Pt and spouse requesting for MD to come see them. CSW let MD know of this request. TOC will continue to follow for DC needs.   2:00 MD notified CSW that pt's Berkley Harvey was approved. Per MD, they are waiting for insurance to approve Kyphoplasty, CSW does not initiate pre authorizations for procedures. CSW unaware of procedure when auth for SNF was started, may expire prior to procedure. TOC will continue to follow for DC needs.  Expected Discharge Plan: Skilled Nursing Facility Barriers to Discharge: Continued Medical Work up, SNF Pending bed offer, English as a second language teacher  Expected Discharge Plan and Services     Post Acute Care Choice: Skilled Nursing Facility Living arrangements for the past 2 months: Single Family Home                                       Social Determinants of Health (SDOH) Interventions SDOH Screenings   Food Insecurity: Patient Declined (02/22/2023)  Housing: Low Risk  (02/22/2023)  Transportation Needs: Patient Declined (02/22/2023)  Utilities: Patient Declined (02/22/2023)  Alcohol Screen: Low Risk  (04/23/2022)  Depression (PHQ2-9): High Risk (02/16/2023)  Financial Resource Strain: Low Risk  (04/23/2022)  Physical Activity: Inactive (04/23/2022)  Social Connections: Socially Integrated (04/23/2022)  Stress: No Stress Concern Present (04/23/2022)  Tobacco Use:  Medium Risk (02/21/2023)    Readmission Risk Interventions     No data to display

## 2023-02-28 NOTE — Care Management Important Message (Signed)
Important Message  Patient Details  Name: Trindon Dorton MRN: [id number] Date of Birth: 10/02/1946   Medicare Important Message Given:  Yes     Sherilyn Banker 02/28/2023, 4:29 PM

## 2023-02-28 NOTE — Progress Notes (Signed)
Physical Therapy Treatment Patient Details Name: Walter [name] MRN: [id number] DOB: Jan 01, 1946 Today's Date: 02/28/2023   History of Present Illness 77 y.o. male presenting 4/22 with back pain, work up revealed AAA aneurysm. Hospital course complicated by finding of acute PE and was started on heparin. X-ray showed an age-indeterminate L1 compression fracture. He has also not been able to tolerate most foods for several months. PMH: CAD, DMII, esophageal stricture, GERD, HTN, DDD, HLD, and dementia.    PT Comments    The pt was agreeable to session with focus on progressing ambulation distance and independence. Continues to demo festinating gait with progressive decline in step clearance and knee flexion. Responds well to cues, but is unable to maintain corrections. Remains at high risk for falls, recommendations remain appropriate.     Recommendations for follow up therapy are one component of a multi-disciplinary discharge planning process, led by the attending physician.  Recommendations may be updated based on patient status, additional functional criteria and insurance authorization.  Follow Up Recommendations  Can patient physically be transported by private vehicle: Yes    Assistance Recommended at Discharge Frequent or constant Supervision/Assistance  Patient can return home with the following A little help with walking and/or transfers;Assistance with cooking/housework;Help with stairs or ramp for entrance;A little help with bathing/dressing/bathroom;Direct supervision/assist for medications management;Assist for transportation   Equipment Recommendations  Rolling walker (2 wheels)    Recommendations for Other Services       Precautions / Restrictions Precautions Precautions: Fall Precaution Comments: shuffling gait form parkinson's Restrictions Weight Bearing Restrictions: No     Mobility  Bed Mobility Overal bed mobility: Needs Assistance Bed Mobility: Supine to  Sit Rolling: Min assist         General bed mobility comments: assist to manage LE to EOB and pt pulling on therapist to reach sitting. minA to steady initially    Transfers Overall transfer level: Needs assistance Equipment used: Rolling walker (2 wheels) Transfers: Sit to/from Stand, Bed to chair/wheelchair/BSC Sit to Stand: Min assist   Step pivot transfers: Min guard       General transfer comment: Cues for hand placement    Ambulation/Gait Ambulation/Gait assistance: Min guard Gait Distance (Feet): 120 Feet Assistive device: Rolling walker (2 wheels) Gait Pattern/deviations: Trunk flexed, Festinating, Decreased stride length, Decreased step length - left, Decreased dorsiflexion - left, Knee flexed in stance - right, Knee flexed in stance - left Gait velocity: decreased, not formally measured     General Gait Details: Pts knees tend to flex more and more as he walks, occassionally self corrects and at times needs VC to stop and stand tall, worse on L than R     Balance Overall balance assessment: History of Falls, Needs assistance Sitting-balance support: No upper extremity supported, Feet supported Sitting balance-Leahy Scale: Fair Sitting balance - Comments: initially needed minA but progressed to minG with time, multiple LOB posteriorly.   Standing balance support: Bilateral upper extremity supported, During functional activity Standing balance-Leahy Scale: Poor Standing balance comment: with RW support                            Cognition Arousal/Alertness: Awake/alert Behavior During Therapy: WFL for tasks assessed/performed Overall Cognitive Status: History of cognitive impairments - at baseline  General Comments: pt pleasant and able to follow all commands, needs repeated cues for gait mechanics and was found soiled in urine and had not alerted staff        Exercises      General Comments  General comments (skin integrity, edema, etc.): pt soilked in urine upon arrival with little awareness, VSS      Pertinent Vitals/Pain Pain Assessment Pain Assessment: Faces Faces Pain Scale: No hurt Pain Intervention(s): Monitored during session     PT Goals (current goals can now be found in the care plan section) Acute Rehab PT Goals Patient Stated Goal: less back pain PT Goal Formulation: With patient Time For Goal Achievement: 03/09/23 Potential to Achieve Goals: Good Progress towards PT goals: Progressing toward goals    Frequency    Min 3X/week      PT Plan Current plan remains appropriate       AM-PAC PT "6 Clicks" Mobility   Outcome Measure  Help needed turning from your back to your side while in a flat bed without using bedrails?: None Help needed moving from lying on your back to sitting on the side of a flat bed without using bedrails?: A Little Help needed moving to and from a bed to a chair (including a wheelchair)?: A Little Help needed standing up from a chair using your arms (e.g., wheelchair or bedside chair)?: A Little Help needed to walk in hospital room?: A Little Help needed climbing 3-5 steps with a railing? : A Lot 6 Click Score: 18    End of Session Equipment Utilized During Treatment: Gait belt Activity Tolerance: Patient tolerated treatment well Patient left: in chair;with chair alarm set;with family/visitor present Nurse Communication: Mobility status PT Visit Diagnosis: Unsteadiness on feet (R26.81);Other abnormalities of gait and mobility (R26.89);History of falling (Z91.81)     Time: 1610-9604 PT Time Calculation (min) (ACUTE ONLY): 25 min  Charges:  $Gait Training: 8-22 mins $Therapeutic Activity: 8-22 mins                     Vickki Muff, PT, DPT   Acute Rehabilitation Department Office [phone] Secure Chat Communication Preferred   Ronnie Derby 02/28/2023, 10:48 AM

## 2023-02-28 NOTE — Progress Notes (Signed)
  Progress Note    02/28/2023 8:49 AM * No surgery found *  Subjective: Back pain his only complaint today  Vitals:   02/27/23 2032 02/28/23 0438  BP: (!) 126/58 (!) 141/74  Pulse: 79 79  Resp: 16 16  Temp: 98.2 F (36.8 C) 97.6 F (36.4 C)  SpO2: 99% 99%    Physical Exam: Awake and alert and oriented. Palpable left common femoral pulse no palpable right common femoral pulse Palpable abdominal aortic aneurysm without any abdominal pain  CBC    Component Value Date/Time   WBC 9.6 02/28/2023 0632   RBC 3.26 (L) 02/28/2023 0632   HGB 7.9 (L) 02/28/2023 0632   HCT 26.2 (L) 02/28/2023 0632   PLT 298 02/28/2023 0632   MCV 80.4 02/28/2023 0632   MCH 24.2 (L) 02/28/2023 0632   MCHC 30.2 02/28/2023 0632   RDW 19.2 (H) 02/28/2023 0632   LYMPHSABS 1.0 04/19/2019 0956   MONOABS 0.4 04/19/2019 0956   EOSABS 0.2 04/19/2019 0956   BASOSABS 0.0 04/19/2019 0956    BMET    Component Value Date/Time   NA 137 02/28/2023 0632   K 4.1 02/28/2023 0632   CL 101 02/28/2023 0632   CO2 26 02/28/2023 0632   GLUCOSE 129 (H) 02/28/2023 0632   GLUCOSE 127 (H) 08/17/2006 1059   BUN 22 02/28/2023 0632   CREATININE 1.18 02/28/2023 0632   CALCIUM 8.8 (L) 02/28/2023 0632   GFRNONAA >60 02/28/2023 0632   GFRAA >60 01/07/2019 0751    INR No results found for: "INR"   Intake/Output Summary (Last 24 hours) at 02/28/2023 0849 Last data filed at 02/28/2023 0440 Gross per 24 hour  Intake 400 ml  Output 950 ml  Net -550 ml     Assessment/plan:  77 y.o. male is here with abdominal aortic aneurysm that measures 6 cm with occluded right external iliac artery.  Patient currently deconditioned with back pain secondary to compression fracture and with new PE and lower extremity DVT now anticoagulated.  Plan is for rehab on discharge and I will see the patient back in approximately 4 weeks for further evaluation and discussion of what would need to be endovascular aneurysm repair with either right  extra iliac artery revascularization or AUI with femorofemoral bypass.    Gaylen Pereira C. Randie Heinz, MD [facility] Office: [phone] Pager: [phone]  02/28/2023 8:49 AM

## 2023-02-28 NOTE — Plan of Care (Signed)

## 2023-02-28 NOTE — Progress Notes (Signed)
ANTICOAGULATION CONSULT NOTE  Pharmacy Consult for Heparin transition to Xarelto Indication:  mural thrombus  and pulmonary embolus  No Known Allergies  Patient Measurements: Height: 5\' 7"  (170.2 cm) Weight: 61.9 kg (136 lb 7.4 oz) IBW/kg (Calculated) : 66.1 kg   Vital Signs: Temp: 97.6 F (36.4 C) (04/29 0438) Temp Source: Oral (04/29 0438) BP: 141/74 (04/29 0438) Pulse Rate: 79 (04/29 0438)  Labs: Recent Labs    02/26/23 0108 02/27/23 0222 02/28/23 0632  HGB 8.0* 7.5* 7.9*  HCT 27.5* 25.9* 26.2*  PLT 315 298 298  CREATININE  --  1.12 1.18    Estimated Creatinine Clearance: 45.9 mL/min (by C-G formula based on SCr of 1.18 mg/dL).   Medical History: Past Medical History:  Diagnosis Date   Allergy    Cancer [facility])    Skin cancer   Cataract    Coronary artery disease    Diabetes mellitus    Diverticulosis    Esophageal stricture    GERD (gastroesophageal reflux disease)    Hyperlipidemia    Hypertension    Peripheral vascular disease (HCC)    Medication History pending.  No anticoagulation PTA.  Assessment: 77 YO M with ongoing pain s/p fall ~ 2 weeks.  Had additional imaging 4/22 which showed aneurysm for which pt was sent to the ED for further imaging.  [place] and CT showed 6 cm infrarenal abdominal aortic aneurysm is noted with a large amount of mural thrombus. Also has acute PE. Pharmacy has been consulted to dose heparin.   Noted anemia, today with hgb stable at 7.9. Heparin level previously at goal on 850 units/hr. Will resume at previous rate. Last dose Xarelto 4/29 @ 1030.   Goal of Therapy:  Monitor platelets by anticoagulation protocol: Yes  Plan:  Hold rivaroxaban for procedure Start heparin infusion at 850 units/hr at 20:00 Check heparin level in 8 hours and daily while on heparin Continue to monitor H&H and platelets  Thank you for allowing pharmacy to be a part of this patient's care.  Thelma Barge, PharmD Clinical Pharmacist

## 2023-02-28 NOTE — Progress Notes (Signed)
IR was requested for L1 KP.   MR reviewed with Dr. Corliss Skains, approved for L1 KP/VP.  Insurance approval sent.   Patient was on Xarelto, last given on 4/28 1734 hrs.  Pt with small PE incidentally found on CTA A/P on 4/22, R LE DVT in posterior tibial veins.  If anticoagulation is needed, patient can be on full dose Lovenox or sq heparin until the date of the procedure is determined.  Full dose Lovenox needs 24 hr hold and sq heparin needs 6 hr hold.   Formal consult to follow once insurance approval received.  Please call IR for questions and concerns.    Lynann Bologna Dudley Mages PA-C 02/28/2023 9:20 AM

## 2023-03-01 DIAGNOSIS — I2699 Other pulmonary embolism without acute cor pulmonale: Secondary | ICD-10-CM | POA: Diagnosis not present

## 2023-03-01 DIAGNOSIS — I714 Abdominal aortic aneurysm, without rupture, unspecified: Secondary | ICD-10-CM | POA: Diagnosis not present

## 2023-03-01 DIAGNOSIS — F0390 Unspecified dementia without behavioral disturbance: Secondary | ICD-10-CM | POA: Diagnosis not present

## 2023-03-01 DIAGNOSIS — S32010A Wedge compression fracture of first lumbar vertebra, initial encounter for closed fracture: Secondary | ICD-10-CM | POA: Diagnosis not present

## 2023-03-01 LAB — GLUCOSE, CAPILLARY
Glucose-Capillary: 105 mg/dL — ABNORMAL HIGH (ref 70–99)
Glucose-Capillary: 117 mg/dL — ABNORMAL HIGH (ref 70–99)
Glucose-Capillary: 121 mg/dL — ABNORMAL HIGH (ref 70–99)
Glucose-Capillary: 131 mg/dL — ABNORMAL HIGH (ref 70–99)
Glucose-Capillary: 131 mg/dL — ABNORMAL HIGH (ref 70–99)
Glucose-Capillary: 147 mg/dL — ABNORMAL HIGH (ref 70–99)
Glucose-Capillary: 161 mg/dL — ABNORMAL HIGH (ref 70–99)
Glucose-Capillary: 272 mg/dL — ABNORMAL HIGH (ref 70–99)
Glucose-Capillary: 299 mg/dL — ABNORMAL HIGH (ref 70–99)

## 2023-03-01 LAB — BASIC METABOLIC PANEL
Anion gap: 9 (ref 5–15)
BUN: 26 mg/dL — ABNORMAL HIGH (ref 8–23)
CO2: 24 mmol/L (ref 22–32)
Calcium: 8.7 mg/dL — ABNORMAL LOW (ref 8.9–10.3)
Chloride: 101 mmol/L (ref 98–111)
Creatinine, Ser: 1.15 mg/dL (ref 0.61–1.24)
GFR, Estimated: >60 mL/min (ref >60)
Glucose, Bld: 108 mg/dL — ABNORMAL HIGH (ref 70–99)
Potassium: 4.1 mmol/L (ref 3.5–5.1)
Sodium: 134 mmol/L — ABNORMAL LOW (ref 135–145)

## 2023-03-01 LAB — CBC
HCT: 25 % — ABNORMAL LOW (ref 39.0–52.0)
Hemoglobin: 7.5 g/dL — ABNORMAL LOW (ref 13.0–17.0)
MCH: 24.3 pg — ABNORMAL LOW (ref 26.0–34.0)
MCHC: 30 g/dL (ref 30.0–36.0)
MCV: 80.9 fL (ref 80.0–100.0)
Platelets: 304 10*3/uL (ref 150–400)
RBC: 3.09 MIL/uL — ABNORMAL LOW (ref 4.22–5.81)
RDW: 19.9 % — ABNORMAL HIGH (ref 11.5–15.5)
WBC: 9.2 10*3/uL (ref 4.0–10.5)
nRBC: 0 % (ref 0.0–0.2)

## 2023-03-01 LAB — APTT
aPTT: 52 seconds — ABNORMAL HIGH (ref 24–36)
aPTT: 63 seconds — ABNORMAL HIGH (ref 24–36)

## 2023-03-01 MED ORDER — CEFAZOLIN SODIUM-DEXTROSE 2-4 GM/100ML-% IV SOLN
2.0000 g | INTRAVENOUS | Status: AC
  Filled 2023-03-01 (×2): qty 100

## 2023-03-01 NOTE — Progress Notes (Signed)
ANTICOAGULATION CONSULT NOTE  Pharmacy Consult for Heparin transition to Xarelto Indication:  mural thrombus  and pulmonary embolus  No Known Allergies  Patient Measurements: Height: 5\' 7"  (170.2 cm) Weight: 61.9 kg (136 lb 7.4 oz) IBW/kg (Calculated) : 66.1 kg   Vital Signs: Temp: 97.9 F (36.6 C) (04/30 0753) Temp Source: Oral (04/30 0753) BP: 144/67 (04/30 0753) Pulse Rate: 88 (04/30 0753)  Labs: Recent Labs    02/27/23 0222 02/28/23 1027 03/01/23 0343 03/01/23 1443  HGB 7.5* 7.9* 7.5*  --   HCT 25.9* 26.2* 25.0*  --   PLT 298 298 304  --   APTT  --   --  52* 63*  CREATININE 1.12 1.18 1.15  --      Estimated Creatinine Clearance: 47.1 mL/min (by C-G formula based on SCr of 1.15 mg/dL).   Medical History: Past Medical History:  Diagnosis Date   Allergy    Cancer [facility])    Skin cancer   Cataract    Coronary artery disease    Diabetes mellitus    Diverticulosis    Esophageal stricture    GERD (gastroesophageal reflux disease)    Hyperlipidemia    Hypertension    Peripheral vascular disease (HCC)    Medication History pending.  No anticoagulation PTA.  Assessment: 77 YO M with ongoing pain s/p fall ~ 2 weeks.  Had additional imaging 4/22 which showed aneurysm for which pt was sent to the ED for further imaging.  [place] and CT showed 6 cm infrarenal abdominal aortic aneurysm is noted with a large amount of mural thrombus. Also has acute PE. Pharmacy has been consulted to dose heparin.   Last dose Xarelto 4/29 @ 1030.   Noted anemia, today with hgb stable at 7.5. Plt 304, are stable. No s/sx of bleeding reported by RN.  aPTT 63, subtherapeutic  Current heparin infusion rate: 1050 units/hr  Goal of Therapy:  aPTT 66-102 seconds  Monitor platelets by anticoagulation protocol: Yes  Plan:  Increase heparin infusion rate to 1150 units/hr Check aPTT in 8 hours and daily while on heparin Continue to monitor H&H and platelets IR to hold heparin infusion  prior to procedure   Thank you for allowing pharmacy to be a part of this patient's care.  Wilburn Cornelia, PharmD, BCPS Clinical Pharmacist 03/01/2023 4:14 PM   Please refer to AMION for pharmacy phone number;a

## 2023-03-01 NOTE — Progress Notes (Signed)
Chief Complaint: Patient was seen in consultation today for L1 vertebral fracture   Referring Physician(s): Dr. Virginia Rochester  Supervising Physician: Julieanne Cotton  Patient Status: [facility] - In-pt  History of Present Illness: Walter [name] is a 77 y.o. male who developed acute low back after trying to lift his father in law. He reports pain in the mid low back, requiring admission. Has improved some with rest and medications but still exacerbated by movements such as sitting up and rotating. Imaging workup finds acute L1 vertebral compression fracture. The pt is also found to have subsegmental PE and has been started on anticoagulation, currently heparin gtt.  IR is asked to eval for consideration of kyphoplasty procedure.  PMHx, meds, labs, imaging, allergies reviewed. Feels okay today, no recent fevers, chills, illness.    Past Medical History:  Diagnosis Date   Allergy    Cancer [facility])    Skin cancer   Cataract    Coronary artery disease    Diabetes mellitus    Diverticulosis    Esophageal stricture    GERD (gastroesophageal reflux disease)    Hyperlipidemia    Hypertension    Peripheral vascular disease (HCC)     Past Surgical History:  Procedure Laterality Date   CORONARY ANGIOPLASTY WITH STENT PLACEMENT     ESOPHAGOGASTRODUODENOSCOPY  06/07/2012   Procedure: ESOPHAGOGASTRODUODENOSCOPY (EGD);  Surgeon: Hart Carwin, MD;  Location: [facility];  Service: Endoscopy;  Laterality: N/A;   SHOULDER SURGERY  2006    Allergies: Patient has no known allergies.  Medications:  Current Facility-Administered Medications:    (feeding supplement) PROSource Plus liquid 30 mL, 30 mL, Oral, Daily, Hollice Espy, MD, 30 mL at 02/28/23 1034   0.9 %  sodium chloride infusion (Manually program via Guardrails IV Fluids), , Intravenous, Once, Adefeso, Oladapo, DO   acetaminophen (TYLENOL) tablet 650 mg, 650 mg, Oral, Q6H PRN, 650 mg at 02/26/23 0817 **OR**  acetaminophen (TYLENOL) suppository 650 mg, 650 mg, Rectal, Q6H PRN, Adefeso, Oladapo, DO   amLODipine (NORVASC) tablet 5 mg, 5 mg, Oral, Daily, Adefeso, Oladapo, DO, 5 mg at 03/01/23 0859   atorvastatin (LIPITOR) tablet 40 mg, 40 mg, Oral, Daily, Adefeso, Oladapo, DO, 40 mg at 03/01/23 0859   cyanocobalamin (VITAMIN B12) tablet 2,000 mcg, 2,000 mcg, Oral, Daily, Adefeso, Oladapo, DO, 2,000 mcg at 03/01/23 0859   docusate sodium (COLACE) capsule 100 mg, 100 mg, Oral, BID, Virginia Rochester K, MD, 100 mg at 03/01/23 0859   donepezil (ARICEPT) tablet 10 mg, 10 mg, Oral, QHS, Adefeso, Oladapo, DO, 10 mg at 02/28/23 2159   feeding supplement (BOOST / RESOURCE BREEZE) liquid 1 Container, 1 Container, Oral, Q24H, Hollice Espy, MD, 1 Container at 02/26/23 2001   feeding supplement (ENSURE ENLIVE / ENSURE PLUS) liquid 237 mL, 237 mL, Oral, BID BM, Hollice Espy, MD, 237 mL at 02/28/23 1035   ferrous sulfate tablet 325 mg, 325 mg, Oral, Q breakfast, Adefeso, Oladapo, DO, 325 mg at 03/01/23 0859   heparin ADULT infusion 100 units/mL (25000 units/268mL), 1,050 Units/hr, Intravenous, Continuous, Bryk, Veronda P, RPH, Last Rate: 10.5 mL/hr at 03/01/23 0900, 1,050 Units/hr at 03/01/23 0900   hydrALAZINE (APRESOLINE) tablet 10 mg, 10 mg, Oral, Q6H PRN, Hollice Espy, MD   insulin aspart (novoLOG) injection 0-9 Units, 0-9 Units, Subcutaneous, TID WC, Hollice Espy, MD, 2 Units at 02/28/23 1713   lidocaine (LIDODERM) 5 % 1 patch, 1 patch, Transdermal, Q24H, Hollice Espy, MD, 1 patch at  02/28/23 1507   losartan (COZAAR) tablet 100 mg, 100 mg, Oral, Daily, Adefeso, Oladapo, DO, 100 mg at 03/01/23 1610   magic mouthwash, 5 mL, Oral, TID, Hollice Espy, MD, 5 mL at 03/01/23 9604   megestrol (MEGACE) tablet 20 mg, 20 mg, Oral, BID, Hollice Espy, MD, 20 mg at 03/01/23 0906   memantine (NAMENDA) tablet 10 mg, 10 mg, Oral, BID, Adefeso, Oladapo, DO, 10 mg at 03/01/23 0906   multivitamin  with minerals tablet 1 tablet, 1 tablet, Oral, Daily, Hollice Espy, MD, 1 tablet at 03/01/23 0907   ondansetron (ZOFRAN) tablet 4 mg, 4 mg, Oral, Q6H PRN **OR** ondansetron (ZOFRAN) injection 4 mg, 4 mg, Intravenous, Q6H PRN, Adefeso, Oladapo, DO, 4 mg at 02/27/23 0950   oxyCODONE (Oxy IR/ROXICODONE) immediate release tablet 2.5 mg, 2.5 mg, Oral, Q4H PRN, Hollice Espy, MD, 2.5 mg at 02/28/23 2159   pantoprazole (PROTONIX) EC tablet 40 mg, 40 mg, Oral, Daily, Adefeso, Oladapo, DO, 40 mg at 03/01/23 0907   polyethylene glycol (MIRALAX / GLYCOLAX) packet 17 g, 17 g, Oral, Daily, Hollice Espy, MD, 17 g at 03/01/23 0907    Family History  Problem Relation Age of Onset   Breast cancer Mother    Healthy Son    Healthy Son    Parkinson's disease Sister    Colon cancer Neg Hx    Esophageal cancer Neg Hx    Rectal cancer Neg Hx    Stomach cancer Neg Hx     Social History   Socioeconomic History   Marital status: Married    Spouse name: Not on file   Number of children: 3   Years of education: Not on file   Highest education level: Not on file  Occupational History   Occupation: Auditor  Tobacco Use   Smoking status: Former    Types: Cigarettes    Quit date: 11/02/1999    Years since quitting: 23.3   Smokeless tobacco: Never  Vaping Use   Vaping Use: Never used  Substance and Sexual Activity   Alcohol use: No   Drug use: No   Sexual activity: Not Currently    Partners: Female  Other Topics Concern   Not on file  Social History Narrative   Daily caffeine       Right handed      Lives with wife in a one story home      Social Determinants of Health   Financial Resource Strain: Low Risk  (04/23/2022)   Overall Financial Resource Strain (CARDIA)    Difficulty of Paying Living Expenses: Not hard at all  Food Insecurity: Patient Declined (02/22/2023)   Hunger Vital Sign    Worried About Running Out of Food in the Last Year: Patient declined    Ran Out of Food  in the Last Year: Patient declined  Transportation Needs: Patient Declined (02/22/2023)   PRAPARE - Administrator, Civil Service (Medical): Patient declined    Lack of Transportation (Non-Medical): Patient declined  Physical Activity: Inactive (04/23/2022)   Exercise Vital Sign    Days of Exercise per Week: 0 days    Minutes of Exercise per Session: 0 min  Stress: No Stress Concern Present (04/23/2022)   [employer] of Occupational Health - Occupational Stress Questionnaire    Feeling of Stress : Not at all  Social Connections: Socially Integrated (04/23/2022)   Social Connection and Isolation Panel [NHANES]    Frequency of Communication with Friends and  Family: More than three times a week    Frequency of Social Gatherings with Friends and Family: More than three times a week    Attends Religious Services: More than 4 times per year    Active Member of Clubs or Organizations: Yes    Attends Engineer, structural: More than 4 times per year    Marital Status: Married    Review of Systems: A 12 point ROS discussed and pertinent positives are indicated in the HPI above.  All other systems are negative.  Review of Systems  Vital Signs: BP (!) 144/67 (BP Location: Right Arm)   Pulse 88   Temp 97.9 F (36.6 C) (Oral)   Resp 16   Ht 5\' 7"  (1.702 m)   Wt 136 lb 7.4 oz (61.9 kg)   SpO2 97%   BMI 21.37 kg/m   Physical Exam Constitutional:      Appearance: He is not ill-appearing.  HENT:     Mouth/Throat:     Mouth: Mucous membranes are moist.     Pharynx: Oropharynx is clear.  Cardiovascular:     Rate and Rhythm: Normal rate and regular rhythm.     Heart sounds: Normal heart sounds.  Pulmonary:     Effort: Pulmonary effort is normal. No respiratory distress.     Breath sounds: Normal breath sounds.  Musculoskeletal:     Comments: Mild tenderness superior lumbar region. No palpable defects.  Skin:    General: Skin is warm and dry.  Neurological:      General: No focal deficit present.     Mental Status: He is alert and oriented to person, place, and time.  Psychiatric:        Mood and Affect: Mood normal.        Thought Content: Thought content normal.        Judgment: Judgment normal.     Imaging:   MR LUMBAR SPINE WO CONTRAST  Result Date: 02/25/2023 CLINICAL DATA:  Compression fracture EXAM: MRI LUMBAR SPINE WITHOUT CONTRAST TECHNIQUE: Multiplanar, multisequence MR imaging of the lumbar spine was performed. No intravenous contrast was administered. COMPARISON:  No prior MRI of the lumbar spine available, correlation is made with CT abdomen pelvis 02/21/2023 and 02/16/2023 lumbar spine radiograph FINDINGS: Segmentation:  5 lumbar type vertebral bodies. Alignment:  Levocurvature of the lumbar spine.  No listhesis. Vertebrae: As seen on the prior CT and radiograph, there is a T1 compression fracture, with up to 70% vertebral body height loss and 3 mm retropulsion of posterior cortex centrally. Within the vertebral body there is a linear focus of marked T2 hyperintense signal. No other acute fracture, suspicious osseous lesion, or evidence of discitis. Conus medullaris and cauda equina: Conus extends to the L1 level. Conus and cauda equina appear normal. Paraspinal and other soft tissues: Fluid collection in the left psoas muscle, which measures up to 4.0 x 3.6 cm in the axial plane (series 5, image 18) and extends from approximately the level of L1 to L4; some of the fluid is T1 hypointense and T2 heterogeneous. This was not evident on the prior CT. No other acute process. Disc levels: T12-L1: No significant disc bulge. Mild retropulsion of the posterosuperior cortex of L1. No spinal canal stenosis or neural foraminal narrowing. L1-L2: Minimal disc bulge with left foraminal and extreme lateral disc protrusion. Mild facet arthropathy. No spinal canal stenosis or neural foraminal narrowing. L2-L3: No significant disc bulge. Mild facet arthropathy. No  spinal canal stenosis or neural  foraminal narrowing. L3-L4: Minimal disc bulge. Mild facet arthropathy. Narrowing of the lateral recesses. No spinal canal stenosis or neural foraminal narrowing. L4-L5: Minimal disc bulge. Mild facet arthropathy. No spinal canal stenosis. No neural foraminal narrowing. L5-S1: No significant disc bulge. No spinal canal stenosis or neural foraminal narrowing. IMPRESSION: 1. Acute to subacute L1 compression fracture, with up to 70% vertebral body height loss and 3 mm retropulsion of the posterior cortex. No spinal canal stenosis or neural foraminal narrowing at this level. 2. Fluid collection in the left psoas muscle, which was not evident on the prior CT and likely represents a hematoma. 3. No spinal canal stenosis or neural foraminal narrowing. Narrowing of the lateral recesses at L3-L4 could affect the descending L4 nerve roots. Electronically Signed   By: Wiliam Ke M.D.   On: 02/25/2023 13:01   Labs:  CBC: Recent Labs    02/26/23 0108 02/27/23 0222 02/28/23 0632 03/01/23 0343  WBC 10.8* 10.9* 9.6 9.2  HGB 8.0* 7.5* 7.9* 7.5*  HCT 27.5* 25.9* 26.2* 25.0*  PLT 315 298 298 304    COAGS: Recent Labs    03/01/23 0343  APTT 52*    BMP: Recent Labs    02/25/23 0806 02/27/23 0222 02/28/23 0632 03/01/23 0343  NA 135 135 137 134*  K 3.7 3.8 4.1 4.1  CL 101 101 101 101  CO2 24 26 26 24   GLUCOSE 99 108* 129* 108*  BUN 19 22 22  26*  CALCIUM 8.6* 8.3* 8.8* 8.7*  CREATININE 1.22 1.12 1.18 1.15  GFRNONAA >60 >60 >60 >60    LIVER FUNCTION TESTS: Recent Labs    12/08/22 1118 02/21/23 1256 02/22/23 0343  BILITOT 0.3 0.5 <0.1*  AST 22 22 19   ALT 16 18 18   ALKPHOS 81 108 110  PROT 7.1 7.4 6.7  ALBUMIN 4.3 3.2* 3.0*     Assessment and Plan: Acute L1 compression fracture. Imaging and case reviewed by Dr. Corliss Skains. Feel pt is good candidate for kyphoplasty. Have received insurance approval. Plan for procedure tomorrow. Risks and benefits of  kyphoplasty were discussed with the patient including, but not limited to education regarding the natural healing process of compression fractures without intervention, bleeding, infection, cement migration which may cause spinal cord damage, paralysis, pulmonary embolism or even death.  This interventional procedure involves the use of X-rays and because of the nature of the planned procedure, it is possible that we will have prolonged use of X-ray fluoroscopy.  Potential radiation risks to you include (but are not limited to) the following: - A slightly elevated risk for cancer  several years later in life. This risk is typically less than 0.5% percent. This risk is low in comparison to the normal incidence of human cancer, which is 33% for women and 50% for men according to the American Cancer Society. - Radiation induced injury can include skin redness, resembling a rash, tissue breakdown / ulcers and hair loss (which can be temporary or permanent).   The likelihood of either of these occurring depends on the difficulty of the procedure and whether you are sensitive to radiation due to previous procedures, disease, or genetic conditions.   IF your procedure requires a prolonged use of radiation, you will be notified and given written instructions for further action.  It is your responsibility to monitor the irradiated area for the 2 weeks following the procedure and to notify your physician if you are concerned that you have suffered a radiation induced injury.  All of the patient's questions were answered, patient is agreeable to proceed.  Consent signed and in chart.   Heparin gtt will need to be stopped, IR will coordinate with RN/Pharmacy to hold once expected procedure time known.  Electronically Signed: Brayton El, PA-C 03/01/2023, 9:43 AM   I spent a total of 30 minutes in face to face in clinical consultation, greater than 50% of which was counseling/coordinating care for  kyphoplasty

## 2023-03-01 NOTE — Progress Notes (Signed)
PT Cancellation Note  Patient Details Name: Walter [name] MRN: [id number] DOB: August 31, 1946   Cancelled Treatment:    Reason Eval/Treat Not Completed: Fatigue/lethargy limiting ability to participate. Pt declines secondary to just getting a wash up and too tired to get out of bed. Will follow up for PT treatment as schedule permits.  Ina Homes, PT, DPT Acute Rehabilitation Services  Personal: [facility] Rehab Office: [phone]  Malachy Chamber 03/01/2023, 11:08 AM

## 2023-03-01 NOTE — Progress Notes (Signed)
Occupational Therapy Treatment Patient Details Name: Walter [name] MRN: [id number] DOB: Mar 12, 1946 Today's Date: 03/01/2023   History of present illness 77 y.o. male presenting 4/22 with back pain, work up revealed AAA aneurysm. Hospital course complicated by finding of acute PE and was started on heparin. X-ray showed an age-indeterminate L1 compression fracture. He has also not been able to tolerate most foods for several months. PMH: CAD, DMII, esophageal stricture, GERD, HTN, DDD, HLD, and dementia.   OT comments  Pt in good spirits today, motivated to participate and improve strength/endurance. Pt able to perform transfers with decreased need for assistance, able to perform toileting using urinal in bed. Pt able to complete toilet transfer min A for sit to stand, min guard for ambulation, improving well. Pt sat at EOB performing LB dressing, instructed on use of sock aide/reacher to assist, able to perform with set up/supervision for instructions.   Pt IV had small pool of blood noticed prior to session, nursing informed, did not change or worsen to end of session. Pt would benefit from continued skilled therapy to improve functional independence and activity tolerance, and to assess if change in functional status following tomorrows back procedure.   Recommendations for follow up therapy are one component of a multi-disciplinary discharge planning process, led by the attending physician.  Recommendations may be updated based on patient status, additional functional criteria and insurance authorization.    Assistance Recommended at Discharge Frequent or constant Supervision/Assistance  Patient can return home with the following  A little help with walking and/or transfers;A little help with bathing/dressing/bathroom;Assistance with cooking/housework;Direct supervision/assist for medications management;Direct supervision/assist for financial management;Assist for transportation;Help with stairs  or ramp for entrance   Equipment Recommendations  Other (comment) (RW)    Recommendations for Other Services      Precautions / Restrictions Precautions Precautions: Fall Restrictions Weight Bearing Restrictions: No       Mobility Bed Mobility Overal bed mobility: Needs Assistance Bed Mobility: Supine to Sit, Sit to Supine     Supine to sit: Min assist Sit to supine: Min assist   General bed mobility comments: min A for in/out of bed for BLEs    Transfers Overall transfer level: Needs assistance Equipment used: Rolling walker (2 wheels) Transfers: Sit to/from Stand, Bed to chair/wheelchair/BSC Sit to Stand: Min assist     Step pivot transfers: Min guard     General transfer comment: no LOB, decreased activity tolerance     Balance Overall balance assessment: History of Falls, Needs assistance Sitting-balance support: No upper extremity supported, Feet supported Sitting balance-Leahy Scale: Good Sitting balance - Comments: able to sit at EOB performing ADLs with sock aide   Standing balance support: Bilateral upper extremity supported, During functional activity Standing balance-Leahy Scale: Poor Standing balance comment: with RW support                           ADL either performed or assessed with clinical judgement   ADL Overall ADL's : Needs assistance/impaired                 Upper Body Dressing : Set up   Lower Body Dressing: Minimal assistance;Sit to/from stand   Toilet Transfer: Minimal assistance;Ambulation;Rolling walker (2 wheels)   Toileting- Clothing Manipulation and Hygiene: Set up;Bed level (urinal)       Functional mobility during ADLs: Min guard General ADL Comments: Pt able to ambulate ~40 feet, got very fatigued and had to  sit down, festering gait. Pt able to perform ADLs using sock aide/reacher at EOB, toelrated well    Extremity/Trunk Assessment Upper Extremity Assessment Upper Extremity Assessment:  Generalized weakness            Vision       Perception     Praxis      Cognition Arousal/Alertness: Awake/alert Behavior During Therapy: WFL for tasks assessed/performed Overall Cognitive Status: History of cognitive impairments - at baseline                                 General Comments: Pt in good spirits today, minimal pain, able to recall history, time frame for injury, plan of care WFLs.        Exercises      Shoulder Instructions       General Comments      Pertinent Vitals/ Pain       Pain Assessment Pain Assessment: 0-10 Pain Score: 4  Faces Pain Scale: Hurts a little bit Pain Intervention(s): Monitored during session  Home Living                                          Prior Functioning/Environment              Frequency  Min 2X/week        Progress Toward Goals  OT Goals(current goals can now be found in the care plan section)  Progress towards OT goals: Progressing toward goals  Acute Rehab OT Goals Patient Stated Goal: to improve strength and prepare for back surgery OT Goal Formulation: With patient/family Time For Goal Achievement: 03/09/23 Potential to Achieve Goals: Good ADL Goals Pt Will Perform Lower Body Bathing: sit to/from stand;with supervision Pt Will Transfer to Toilet: with supervision Pt Will Perform Toileting - Clothing Manipulation and hygiene: with modified independence Additional ADL Goal #1: Pt will independently identify 3 strategies to reduce risk of falls  Plan Discharge plan remains appropriate    Co-evaluation                 AM-PAC OT "6 Clicks" Daily Activity     Outcome Measure   Help from another person eating meals?: A Little Help from another person taking care of personal grooming?: A Little Help from another person toileting, which includes using toliet, bedpan, or urinal?: A Little Help from another person bathing (including washing, rinsing,  drying)?: A Little Help from another person to put on and taking off regular upper body clothing?: A Little Help from another person to put on and taking off regular lower body clothing?: A Little 6 Click Score: 18    End of Session Equipment Utilized During Treatment: Gait belt;Rolling walker (2 wheels)  OT Visit Diagnosis: Unsteadiness on feet (R26.81);Other abnormalities of gait and mobility (R26.89);Muscle weakness (generalized) (M62.81);History of falling (Z91.81);Pain   Activity Tolerance Patient tolerated treatment well   Patient Left in bed;with call bell/phone within reach;with family/visitor present   Nurse Communication Mobility status        Time: 6295-2841 OT Time Calculation (min): 25 min  Charges: OT General Charges $OT Visit: 1 Visit OT Treatments $Self Care/Home Management : 23-37 mins  [address] OTR/L   Alexis Goodell 03/01/2023, 5:23 PM

## 2023-03-01 NOTE — TOC Progression Note (Signed)
Transition of Care [facility]) - Progression Note    Patient Details  Name: Walter [name] MRN: [id number] Date of Birth: 1946/01/30  [facility] CM/SW Contact  Carley Hammed, LCSW Phone Number: 03/01/2023, 12:41 PM  Clinical Narrative:    CSW notified pt's procedure is scheduled. Insurance approved for SNF and facility continues to follow for admission when medically ready. TOC will continue to follow.    Expected Discharge Plan: Skilled Nursing Facility Barriers to Discharge: Continued Medical Work up, SNF Pending bed offer, English as a second language teacher  Expected Discharge Plan and Services     Post Acute Care Choice: Skilled Nursing Facility Living arrangements for the past 2 months: Single Family Home                                       Social Determinants of Health (SDOH) Interventions SDOH Screenings   Food Insecurity: Patient Declined (02/22/2023)  Housing: Low Risk  (02/22/2023)  Transportation Needs: Patient Declined (02/22/2023)  Utilities: Patient Declined (02/22/2023)  Alcohol Screen: Low Risk  (04/23/2022)  Depression (PHQ2-9): High Risk (02/16/2023)  Financial Resource Strain: Low Risk  (04/23/2022)  Physical Activity: Inactive (04/23/2022)  Social Connections: Socially Integrated (04/23/2022)  Stress: No Stress Concern Present (04/23/2022)  Tobacco Use: Medium Risk (02/21/2023)    Readmission Risk Interventions     No data to display

## 2023-03-01 NOTE — Progress Notes (Signed)
   03/01/23 1400  Spiritual Encounters  Type of Visit Initial  Care provided to: Patient  Referral source Patient request  Reason for visit Routine spiritual support  OnCall Visit No  Spiritual Framework  Presenting Themes Meaning/purpose/sources of inspiration  Community/Connection Family  Interventions  Spiritual Care Interventions Made Reflective listening;Compassionate presence   Ch responded to request for emotional and spiritual support. Pt's daughter was at bedside. Pt's faith is very important to him. Ch provided hospitality and asked guided questions about faith. No follow-up needed at this time.

## 2023-03-01 NOTE — Progress Notes (Signed)
ANTICOAGULATION CONSULT NOTE - Follow Up Consult  Pharmacy Consult for heparin Indication:  mural thrombus  and pulmonary embolus  Labs: Recent Labs    02/27/23 0222 02/28/23 0632 03/01/23 0343  HGB 7.5* 7.9* 7.5*  HCT 25.9* 26.2* 25.0*  PLT 298 298 304  APTT  --   --  52*  CREATININE 1.12 1.18 1.15   Assessment: 76yo male subtherapeutic on heparin with initial dosing while rivaroxaban on hold.  Goal of Therapy:  aPTT 66-102 seconds   Plan:  Increase heparin infusion by 3 units/kg/hr to 1050 units/hr. Check PTT in 8 hours.   Walter [name], PharmD, BCPS 03/01/2023 6:10 AM

## 2023-03-01 NOTE — Progress Notes (Signed)
PROGRESS NOTE    Walter [name]  ZOX:[id number] DOB: 01-16-1946 DOA: 02/21/2023 PCP: Kristian Covey, MD   Brief Narrative:    Walter [name] is a 77 y.o. male with medical history significant of hypertension, hyperlipidemia, T2DM, GERD, dementia who presented to the emergency department on 4/22 for evaluation of abnormal aortic aneurysm that was seen on outpatient x-ray that was done for 2 weeks of lower back pain after a fall.  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  Images that extended into the lower chest noted small subsegmental incidental pulmonary embolus in right lower lobe.  Patient had initially presented to [facility] emergency room in [place] and transferred to [facility] on night of 4/23.  Vascular surgery evaluated patient and advised no need for immediate intervention until PE treated.  Can follow-up in office in 6 months if surgery still desired.  Patient transition from heparin to Eliquis.  Noted to have left thigh hematoma that looks to be self-contained.  MRI confirms acuity of L1 fracture and interventional radiology taking patient for kyphoplasty on 5/1.  (transitioned back to heparin for kyphoplasty) seen by PT and OT and patient for skilled nursing, which insurance has approved.  Assessment & Plan:   Principal Problem:   Abdominal aortic aneurysm (HCC) Active Problems:   Essential hypertension   GERD (gastroesophageal reflux disease)   Acute pulmonary embolism (HCC)   Closed compression fracture of L1 lumbar vertebra, initial encounter (HCC)   Recurrent falls   Hypoalbuminemia due to protein-calorie malnutrition (HCC)   Mixed hyperlipidemia   Prediabetes   Dementia without behavioral disturbance (HCC)   Normocytic anemia   Protein-calorie malnutrition, severe (HCC)  Assessment and Plan:   Abdominal aortic aneurysm enlarged,  close to 6 cm.  Patient seen by vascular surgery who recommended no immediate intervention.  Suspect he will likely need surgery, although it is not urgent and likely has been there for some time.  His more immediate issues are his pulmonary embolus.    Subsegmental pulmonary embolism Patient was started on IV heparin drip.  Suspect etiology is after lower back pain issues, patient reportedly almost nonmobile for the past 2 weeks.  However prior to that, quite ambulatory.  Lower extremity Dopplers confirm clot in right lower extremity.  Patient transitioned from heparin to Xarelto.  In discussion with interventional radiology, have switched back to heparin for now  Left thigh hematoma: Patient started having some severe left thigh pain on evening of 4/25.  Patient underwent MRI of lumbar spine which also noted left thigh hematoma.  Looks to be self-contained is, as patient is no longer having any thigh discomfort and hemoglobin has remained stable.  Patient's hemoglobin slightly up from previous day, currently at 7.9.  Patient not hypotensive, but will continue to monitor.  In the end, if we do feel that he is having some continued bleeding, will have to discontinue Xarelto and place IVC filter.    Acute anemia Patient noted to be on iron and B12 supplementation at home.  Noted MCV of 77, and iron panel with normal TIBC, but low iron and ferritin, from likely some component of chronic disease.  Will add iron supplement.  Hemoglobin has remained stable Status post 1 unit PRBC transfusion with no overt bleeding noted.  Slight drop today at 7.5.  Transfuse for hemoglobin below 7.   Age-indeterminate L1  compression fracture in the setting of recurrent falls Continue fall precautions.  Given his previous active lifestyle, feel like he is good a good candidate for anticoagulation.  Seen by PT and OT who are recommending skilled nursing, will plan to transfer on Monday.  MRI notes acute/subacute L1 compression  fracture.  Interventional radiology feel patient would be a good candidate for kyphoplasty and with insurance approval confirmed, patient going for procedure 5/1.  Continue as needed Oxy IR.  Changed Xarelto back to heparin infusion.   Severe protein calorie malnutrition: According to family, patient's p.o. intake has been declining over the past year.  Nutrition Status: Nutrition Problem: Severe Malnutrition Etiology: chronic illness Signs/Symptoms: severe fat depletion, moderate fat depletion, severe muscle depletion, energy intake < or equal to 75% for > or equal to 1 month Interventions: Ensure Enlive (each supplement provides 350kcal and 20 grams of protein), MVI, Liberalize Diet Appreciate nutrition help, adding Megace  Constipation: Responded well to occasional lactulose  Oral thrush: Magic mouthwash   Essential hypertension Continue amlodipine, losartan   Prediabetes Hemoglobin A1c on 12/08/2022 was 5.8 Continue diet and lifestyle modification at this time   Mixed hyperlipidemia Continue Lipitor   GERD Continue Protonix   Dementia Continue Aricept, Namenda   DVT prophylaxis: Heparin drip Code Status: DNR Family Communication: Wife at bedside Disposition Plan:  Status is: Inpatient -Kyphoplasty -Transition back to Xarelto post-procedure -Eventual skilled nursing   Consultants:  Vascular surgery-Dr. Randie Heinz Interventional radiology  Procedures:  1 unit packed red blood cell transfusion Lower extremity Dopplers noting right lower extremity DVT Planned kyphoplasty 5/1  Antimicrobials:  None   Subjective: Patient doing okay, had good bowel movement, less pain today  Objective: Vitals:   02/28/23 1709 02/28/23 2142 03/01/23 0415 03/01/23 0753  BP: 125/62 138/62 139/62 (!) 144/67  Pulse: (!) 105 75 83 88  Resp: 18 18 18 16   Temp: 98 F (36.7 C) 98.2 F (36.8 C) 97.8 F (36.6 C) 97.9 F (36.6 C)  TempSrc: Oral Oral Oral Oral  SpO2: 98% 100% 97% 97%   Weight:      Height:        Intake/Output Summary (Last 24 hours) at 03/01/2023 1152 Last data filed at 03/01/2023 0753 Gross per 24 hour  Intake 64.98 ml  Output 995 ml  Net -930.02 ml    Filed Weights   02/21/23 1159 02/28/23 0438  Weight: 64.9 kg 61.9 kg    Examination:  General exam: Appears calm and comfortable, no acute distress, oriented x 2 HEENT: Mucous membranes are dry, some mild thrush patch on tongue-which has improved Respiratory system: Clear to auscultation. Respiratory effort normal. Cardiovascular system: S1 & S2 heard, RRR.  Gastrointestinal system: Abdomen is soft, nontender, nondistended, positive bowel sounds Central nervous system: No focal deficits Extremities: No edema Skin: No significant lesions noted Psychiatry: Flat affect.  Appropriate    Data Reviewed: I have personally reviewed following labs and imaging studies Hemoglobin at 7.5.  Creatinine 1.15.    Radiology Studies: No results found.      Scheduled Meds:  (feeding supplement) PROSource Plus  30 mL Oral Daily   sodium chloride   Intravenous Once   amLODipine  5 mg Oral Daily   atorvastatin  40 mg Oral Daily   cyanocobalamin  2,000 mcg Oral Daily   docusate sodium  100 mg Oral BID   donepezil  10 mg Oral QHS   feeding supplement  1 Container Oral Q24H   feeding supplement  237 mL  Oral BID BM   ferrous sulfate  325 mg Oral Q breakfast   insulin aspart  0-9 Units Subcutaneous TID WC   lidocaine  1 patch Transdermal Q24H   losartan  100 mg Oral Daily   magic mouthwash  5 mL Oral TID   megestrol  20 mg Oral BID   memantine  10 mg Oral BID   multivitamin with minerals  1 tablet Oral Daily   pantoprazole  40 mg Oral Daily   polyethylene glycol  17 g Oral Daily   Continuous Infusions:  [START ON 03/02/2023]  ceFAZolin (ANCEF) IV     heparin 1,050 Units/hr (03/01/23 0900)      LOS: 8 days       Hollice Espy, MD [facility]  If 7PM-7AM, please contact  night-coverage [email or web address] 03/01/2023, 11:52 AM

## 2023-03-02 ENCOUNTER — Inpatient Hospital Stay (HOSPITAL_COMMUNITY): Payer: Medicare HMO

## 2023-03-02 DIAGNOSIS — I714 Abdominal aortic aneurysm, without rupture, unspecified: Secondary | ICD-10-CM | POA: Diagnosis not present

## 2023-03-02 HISTORY — PX: IR KYPHO LUMBAR INC FX REDUCE BONE BX UNI/BIL CANNULATION INC/IMAGING: IMG5519

## 2023-03-02 LAB — GLUCOSE, CAPILLARY
Glucose-Capillary: 111 mg/dL — ABNORMAL HIGH (ref 70–99)
Glucose-Capillary: 110 mg/dL — ABNORMAL HIGH (ref 70–99)
Glucose-Capillary: 217 mg/dL — ABNORMAL HIGH (ref 70–99)
Glucose-Capillary: 135 mg/dL — ABNORMAL HIGH (ref 70–99)

## 2023-03-02 LAB — CBC
HCT: 23.4 % — ABNORMAL LOW (ref 39.0–52.0)
Hemoglobin: 7.1 g/dL — ABNORMAL LOW (ref 13.0–17.0)
MCH: 24.3 pg — ABNORMAL LOW (ref 26.0–34.0)
MCHC: 30.3 g/dL (ref 30.0–36.0)
MCV: 80.1 fL (ref 80.0–100.0)
Platelets: 286 10*3/uL (ref 150–400)
RBC: 2.92 MIL/uL — ABNORMAL LOW (ref 4.22–5.81)
RDW: 19.9 % — ABNORMAL HIGH (ref 11.5–15.5)
WBC: 10.4 10*3/uL (ref 4.0–10.5)
nRBC: 0 % (ref 0.0–0.2)

## 2023-03-02 LAB — PROTIME-INR
INR: 1.2 (ref 0.8–1.2)
Prothrombin Time: 14.8 seconds (ref 11.4–15.2)

## 2023-03-02 LAB — APTT: aPTT: 68 seconds — ABNORMAL HIGH (ref 24–36)

## 2023-03-02 MED ORDER — FENTANYL CITRATE (PF) 100 MCG/2ML IJ SOLN
INTRAMUSCULAR | Status: AC
  Filled 2023-03-02: qty 2

## 2023-03-02 MED ORDER — BUPIVACAINE HCL (PF) 0.25 % IJ SOLN
30.0000 mL | Freq: Once | INTRAMUSCULAR | Status: AC
  Administered 2023-03-02: 30 mL
  Filled 2023-03-02: qty 30

## 2023-03-02 MED ORDER — MIDAZOLAM HCL 2 MG/2ML IJ SOLN
INTRAMUSCULAR | Status: AC | PRN
  Administered 2023-03-02: 1 mg via INTRAVENOUS

## 2023-03-02 MED ORDER — TOBRAMYCIN SULFATE 1.2 G IJ SOLR
INTRAMUSCULAR | Status: AC
  Filled 2023-03-02: qty 1.2

## 2023-03-02 MED ORDER — MIDAZOLAM HCL 2 MG/2ML IJ SOLN
INTRAMUSCULAR | Status: AC
  Filled 2023-03-02: qty 2

## 2023-03-02 MED ORDER — DIPHENHYDRAMINE HCL 25 MG PO CAPS
25.0000 mg | ORAL_CAPSULE | Freq: Once | ORAL | Status: DC
  Filled 2023-03-02: qty 1

## 2023-03-02 MED ORDER — CEFAZOLIN SODIUM-DEXTROSE 2-4 GM/100ML-% IV SOLN
INTRAVENOUS | Status: AC | PRN
  Administered 2023-03-02: 2 g via INTRAVENOUS

## 2023-03-02 MED ORDER — BUPIVACAINE HCL (PF) 0.25 % IJ SOLN
INTRAMUSCULAR | Status: AC
  Filled 2023-03-02: qty 30

## 2023-03-02 MED ORDER — CEFAZOLIN SODIUM-DEXTROSE 2-4 GM/100ML-% IV SOLN
INTRAVENOUS | Status: AC
  Filled 2023-03-02: qty 100

## 2023-03-02 MED ORDER — IOHEXOL 300 MG/ML  SOLN: 50.0000 mL | Freq: Once | INTRAMUSCULAR | Status: DC | PRN

## 2023-03-02 MED ORDER — HEPARIN (PORCINE) 25000 UT/250ML-% IV SOLN
1200.0000 [IU]/h | INTRAVENOUS | Status: AC
  Administered 2023-03-02 – 2023-03-03 (×2): 1200 [IU]/h via INTRAVENOUS
  Filled 2023-03-02: qty 250

## 2023-03-02 MED ORDER — FENTANYL CITRATE (PF) 100 MCG/2ML IJ SOLN
INTRAMUSCULAR | Status: AC | PRN
  Administered 2023-03-02 (×2): 25 ug via INTRAVENOUS

## 2023-03-02 NOTE — Procedures (Signed)
INR.  Status post L1 balloon kyphoplasty via  right transpedicular approach.  Biopsy obtained and sent for analysis as per requesting MD.  No acute complications.  Patient tolerated the procedure well.  Fatima Sanger MD..

## 2023-03-02 NOTE — Progress Notes (Signed)
PT Cancellation Note  Patient Details Name: Tylek Boney MRN: [id number] DOB: [birth year]   Cancelled Treatment:    Reason Eval/Treat Not Completed: Patient at procedure or test/unavailable.  Gone for surgery, will retry at another time.   Ivar Drape 03/02/2023, 10:19 AM  Samul Dada, PT PhD Acute Rehab Dept. Number: [facility] [id number] and [facility] [phone]

## 2023-03-02 NOTE — Plan of Care (Signed)

## 2023-03-02 NOTE — Progress Notes (Signed)
ANTICOAGULATION CONSULT NOTE  Pharmacy Consult for Heparin transition to Xarelto Indication:  mural thrombus  and pulmonary embolus  No Known Allergies  Patient Measurements: Height: 5\' 7"  (170.2 cm) Weight: 61.9 kg (136 lb 7.4 oz) IBW/kg (Calculated) : 66.1 kg   Vital Signs: Temp: 97.5 F (36.4 C) (05/01 1245) Temp Source: Oral (05/01 1245) BP: 138/70 (05/01 1245) Pulse Rate: 78 (05/01 1245)  Labs: Recent Labs    02/28/23 0632 03/01/23 0343 03/01/23 1443 03/02/23 0415  HGB 7.9* 7.5*  --  7.1*  HCT 26.2* 25.0*  --  23.4*  PLT 298 304  --  286  APTT  --  52* 63* 68*  LABPROT  --   --   --  14.8  INR  --   --   --  1.2  CREATININE 1.18 1.15  --   --      Estimated Creatinine Clearance: 47.1 mL/min (by C-G formula based on SCr of 1.15 mg/dL).   Medical History: Past Medical History:  Diagnosis Date   Allergy    Cancer [facility])    Skin cancer   Cataract    Coronary artery disease    Diabetes mellitus    Diverticulosis    Esophageal stricture    GERD (gastroesophageal reflux disease)    Hyperlipidemia    Hypertension    Peripheral vascular disease (HCC)    Medication History pending.  No anticoagulation PTA.  Assessment: 77 YO M with ongoing pain s/p fall ~ 2 weeks.  Had additional imaging 4/22 which showed aneurysm for which pt was sent to the ED for further imaging.  [place] and CT showed 6 cm infrarenal abdominal aortic aneurysm is noted with a large amount of mural thrombus. Also has acute PE. Pharmacy has been consulted to dose heparin.   Last dose Xarelto 4/29 @ 1030. Heparin was held starting at 08:30 for kyphoplasty in IR on 03/02/23. Pharmacy asked to resume heparin infusion 6 hours post-procedure, completed ~11:30 on 5/1.  Noted anemia, today with hgb stable at 7.1. Plt 286, are stable. No s/sx of bleeding reported by Dr. Corliss Skains post-procedurally.   Goal of Therapy:  aPTT 66-102 seconds  Monitor platelets by anticoagulation protocol: Yes  Plan:   Resume heparin infusion at 1200 units/hr at 17:30 this evening Check aPTT in 8 hours and daily while on heparin Check heparin level with AM labs to assess whether it is correlating with aPTT Continue to monitor H&H and platelets   Thank you for allowing pharmacy to be a part of this patient's care.  Wilburn Cornelia, PharmD, BCPS Clinical Pharmacist 03/02/2023 1:35 PM   Please refer to AMION for pharmacy phone number;a

## 2023-03-02 NOTE — Progress Notes (Signed)
PROGRESS NOTE  Walter [name]  DOB: 22-Oct-1946  PCP: Kristian Covey, MD ZOX:[id number]  DOA: 02/21/2023  LOS: 9 days  Hospital Day: 10  Brief narrative: Walter [name] is a 77 y.o. male with PMH significant for DM2, HTN, HLD, CAD, PVD, dementia, GERD, diverticulosis. 4/17, patient had a lumbar spine x-ray done for 2 weeks of lower back pain after a fall.  X-ray showed L1 compression fracture but also showed 5 cm distal AAA. 4/22, patient was sent to the ED at [facility] for evaluation of AAA. Ultrasound aorta showed 6 cm infrarenal AAA with large amount of mural thrombus. 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 Images that extended into the lower chest noted small subsegmental incidental pulmonary embolus in right lower lobe.   Vascular surgery was consulted Patient was started on IV heparin Patient was admitted to [facility] at [facility] Ultrasound duplex of lower extremities showed acute DVT of right posterior tibial veins.   Subjective: Patient was seen and examined this morning.  Pleasant elderly Caucasian male.  Lying down in bed.  Underwent kyphoplasty this morning.  Assessment and plan: Large infrarenal AAA with mural thrombus Vascular surgery evaluated the patient and advised no need for immediate intervention for AAA until PE has been treated.   To follow-up in office in 6 months for repeat ultrasound.   Acute to subacute L1 compression fracture Likely secondary to fall 2 weeks prior to presentation MRI 4/26 showed acute to subacute L1 compression fracture with up to 70% vertebral body height loss and 3 mm retropulsion of the posterior cortex.  No spinal canal stenosis or neural foraminal narrowing. IR consulted. 5/1, underwent L1 balloon kyphoplasty.  Biopsy sent as well. Currently on pain management with Oxy IR Per IR, okay to resume IV heparin this afternoon.     Subsegmental pulmonary embolism Acute DVT of right posterior tibial veins PE seen in initial CT angio done for evaluation of AAA.   DVT of right posterior tibial veins are noted as well. DVT/PE suspected to be due to impaired mobility for 2 weeks after a fall.  Patient was ambulatory prior to that. Currently on IV heparin.  Plan to switch to oral Xarelto prior to discharge  Left psoas hematoma 4/25, patient complained of severe left thigh pain. MRI lumbar spine 4/26 also showed left psoas muscle hematoma. Looks to be self-contained.  Unclear if it was the primary etiology of pain. No longer having any pain.  Hemodynamically stable.  Continue to monitor.     Acute on chronic anemia Unclear baseline hemoglobin as the most recent blood work prior to this hospitalization was from 2020 with hemoglobin more than 12.  However ferritin level is low suggestive of chronic slow bleeding. This hospitalization, hemoglobin has been running low.  Slowly downtrending on heparin drip. 1 unit of PRBC transfusion was given.  Continue monitor hemoglobin. If hemoglobin continues to drop, may need to discontinue anticoagulation and place IVC filter.   Recent Labs    12/08/22 1118 02/16/23 1059 02/23/23 0625 02/23/23 0626 02/23/23 8119 02/23/23 1539 02/24/23 0709 02/26/23 0108 02/27/23 0222 02/28/23 1478 03/01/23 0343 03/02/23 0415  HGB  --    < >  --   --  8.4*  --    < > 8.0* 7.5* 7.9* 7.5* 7.1*  MCV  --    < >  --   --  77.4*  --    < >  80.4 80.7 80.4 80.9 80.1  VITAMINB12 320  --   --   --   --  678  --   --   --   --   --   --   FOLATE  --   --  13.2  --   --   --   --   --   --   --   --   --   FERRITIN  --   --   --   --  23*  --   --   --   --   --   --   --   TIBC  --   --   --   --  377  --   --   --   --   --   --   --   IRON  --   --   --   --  16*  --   --   --   --   --   --   --   RETICCTPCT  --   --   --  2.0  --   --   --   --   --   --   --   --    < > = values in this interval  not displayed.    Essential hypertension Blood pressure stable with amlodipine, losartan   Prediabetes Hemoglobin A1c on 12/08/2022 was 5.8 Continue diet and lifestyle modification at this time   Mixed hyperlipidemia Continue Lipitor   GERD Continue Protonix   Dementia Continue Aricept, Namenda  Severe protein calorie malnutrition Declining oral intake for last year. Nutrition consult obtained. Started on supplements  Constipation Continue scheduled and as needed bowel regimen   Oral thrush Magic mouthwash  Impaired mobility Previously ambulatory but since since fall 2 weeks prior, he has been less ambulatory.  Has also suffered physical deconditioning.  PT eval obtained.  SNF recommended.  Goals of care   Code Status: DNR     DVT prophylaxis:  SCDs Start: 02/21/23 1945   Antimicrobials: None Fluid: None currently Consultants: IR, vascular surgery Family Communication: Patient wife and other family members at bedside  Status: Inpatient Level of care:  Med-Surg   Patient from: Home Anticipated d/c to: SNF Needs to continue in-hospital care:  Monitor hemoglobin, if continues to drop, may need IVC filter placed   Diet:  Diet Order             Diet NPO time specified Except for: Sips with Meds  Diet effective midnight                   Scheduled Meds:  (feeding supplement) PROSource Plus  30 mL Oral Daily   sodium chloride   Intravenous Once   amLODipine  5 mg Oral Daily   atorvastatin  40 mg Oral Daily   cyanocobalamin  2,000 mcg Oral Daily   diphenhydrAMINE  25 mg Oral Once   docusate sodium  100 mg Oral BID   donepezil  10 mg Oral QHS   feeding supplement  1 Container Oral Q24H   feeding supplement  237 mL Oral BID BM   ferrous sulfate  325 mg Oral Q breakfast   insulin aspart  0-9 Units Subcutaneous TID WC   lidocaine  1 patch Transdermal Q24H   losartan  100 mg Oral Daily   magic mouthwash  5 mL Oral TID   megestrol  20  mg Oral BID    memantine  10 mg Oral BID   multivitamin with minerals  1 tablet Oral Daily   pantoprazole  40 mg Oral Daily   polyethylene glycol  17 g Oral Daily    PRN meds: acetaminophen **OR** acetaminophen, hydrALAZINE, ondansetron **OR** ondansetron (ZOFRAN) IV, oxyCODONE   Infusions:    ceFAZolin (ANCEF) IV     heparin Stopped (03/02/23 0830)    Antimicrobials: Anti-infectives (From admission, onward)    Start     Dose/Rate Route Frequency Ordered Stop   03/02/23 0600  ceFAZolin (ANCEF) IVPB 2g/100 mL premix       Note to Pharmacy: Do not send to floor, will be given in Radiology dept prior to procedure.   2 g 200 mL/hr over 30 Minutes Intravenous On call 03/01/23 1039 03/03/23 0600       Nutritional status:  Body mass index is 21.37 kg/m.  Nutrition Problem: Severe Malnutrition Etiology: chronic illness Signs/Symptoms: severe fat depletion, moderate fat depletion, severe muscle depletion, energy intake < or equal to 75% for > or equal to 1 month     Objective: Vitals:   03/02/23 0457 03/02/23 0749  BP: (!) 144/63 (!) 141/62  Pulse: 80 75  Resp:  16  Temp: 98 F (36.7 C) 98 F (36.7 C)  SpO2: 98% 98%    Intake/Output Summary (Last 24 hours) at 03/02/2023 1041 Last data filed at 03/02/2023 0624 Gross per 24 hour  Intake 370 ml  Output 550 ml  Net -180 ml   Filed Weights   02/21/23 1159 02/28/23 0438  Weight: 64.9 kg 61.9 kg   Weight change:  Body mass index is 21.37 kg/m.   Physical Exam: General exam: Pleasant, elderly Caucasian male.  Not in distress Skin: No rashes, lesions or ulcers. HEENT: Atraumatic, normocephalic, no obvious bleeding Lungs: Clear to auscultation bilaterally CVS: Regular rate and rhythm, no murmur GI/Abd soft, nontender, nondistended, bowel sound present CNS: Alert, awake, oriented x 3. Psychiatry: Mood appropriate Extremities: No pedal edema, no calf tenderness  Data Review: I have personally reviewed the laboratory data and studies  available.  F/u labs ordered Unresulted Labs (From admission, onward)     Start     Ordered   03/03/23 0500  APTT  Tomorrow morning,   R        03/02/23 0223   03/03/23 0500  Heparin level (unfractionated)  Daily,   R      03/02/23 0223   03/02/23 1400  APTT  Once-Timed,   TIMED        03/02/23 0601   03/02/23 0500  CBC  Daily,   R     See Hyperspace for full Linked Orders Report.   02/28/23 1349            Total time spent in review of labs and imaging, patient evaluation, formulation of plan, documentation and communication with family: 55 minutes  Signed, Lorin Glass, MD [facility] 03/02/2023

## 2023-03-02 NOTE — Progress Notes (Signed)
ANTICOAGULATION CONSULT NOTE - Follow Up Consult  Pharmacy Consult for heparin Indication:  mural thrombus  and pulmonary embolus  Labs: Recent Labs    02/28/23 0632 03/01/23 0343 03/01/23 1443 03/02/23 0415  HGB 7.9* 7.5*  --  7.1*  HCT 26.2* 25.0*  --  23.4*  PLT 298 304  --  286  APTT  --  52* 63* 68*  LABPROT  --   --   --  14.8  INR  --   --   --  1.2  CREATININE 1.18 1.15  --   --     Assessment: 77yo male therapeutic on heparin but at low end of goal.  Goal of Therapy:  aPTT 66-102 seconds   Plan:  Increase heparin infusion very slightly to 1200 units/hr. Check PTT in 8 hours.   Vernard Gambles, PharmD, BCPS 03/02/2023 6:00 AM

## 2023-03-03 DIAGNOSIS — I714 Abdominal aortic aneurysm, without rupture, unspecified: Secondary | ICD-10-CM | POA: Diagnosis not present

## 2023-03-03 LAB — CBC
HCT: 23.8 % — ABNORMAL LOW (ref 39.0–52.0)
Hemoglobin: 7.3 g/dL — ABNORMAL LOW (ref 13.0–17.0)
MCH: 24.6 pg — ABNORMAL LOW (ref 26.0–34.0)
MCHC: 30.7 g/dL (ref 30.0–36.0)
MCV: 80.1 fL (ref 80.0–100.0)
Platelets: 284 10*3/uL (ref 150–400)
RBC: 2.97 MIL/uL — ABNORMAL LOW (ref 4.22–5.81)
RDW: 20.4 % — ABNORMAL HIGH (ref 11.5–15.5)
WBC: 9.2 10*3/uL (ref 4.0–10.5)
nRBC: 0 % (ref 0.0–0.2)

## 2023-03-03 LAB — HEPARIN LEVEL (UNFRACTIONATED): Heparin Unfractionated: 0.59 IU/mL (ref 0.30–0.70)

## 2023-03-03 LAB — APTT: aPTT: 78 seconds — ABNORMAL HIGH (ref 24–36)

## 2023-03-03 LAB — GLUCOSE, CAPILLARY: Glucose-Capillary: 97 mg/dL (ref 70–99)

## 2023-03-03 MED ORDER — RIVAROXABAN 20 MG PO TABS: 20.0000 mg | ORAL_TABLET | Freq: Every day | ORAL | Status: DC

## 2023-03-03 MED ORDER — RIVAROXABAN 15 MG PO TABS
15.0000 mg | ORAL_TABLET | Freq: Two times a day (BID) | ORAL | Status: DC
  Administered 2023-03-03 – 2023-03-04 (×3): 15 mg via ORAL
  Filled 2023-03-03 (×3): qty 1

## 2023-03-03 MED ORDER — LIDOCAINE 5 % EX PTCH
1.0000 | MEDICATED_PATCH | CUTANEOUS | Status: DC
  Administered 2023-03-03 – 2023-03-09 (×7): 1 via TRANSDERMAL
  Filled 2023-03-03 (×7): qty 1

## 2023-03-03 NOTE — Progress Notes (Signed)
PT Cancellation Note  Patient Details Name: Walter [name] MRN: [id number] DOB: Mar 09, 1946   Cancelled Treatment:    Reason Eval/Treat Not Completed: Other (comment). Getting a bath, retry at a later time.   Ivar Drape 03/03/2023, 12:09 PM  Samul Dada, PT PhD Acute Rehab Dept. Number: [facility] [id number] and [facility] [phone]

## 2023-03-03 NOTE — Progress Notes (Signed)
PROGRESS NOTE  Sunday Corn  DOB: [birth year]  PCP: Kristian Covey, MD ZOX:[id number]  DOA: 02/21/2023  LOS: 10 days  Hospital Day: 11  Brief narrative: Walter [name] is a 77 y.o. male with PMH significant for DM2, HTN, HLD, CAD, PVD, dementia, GERD, diverticulosis. 4/17, patient had a lumbar spine x-ray done for 2 weeks of lower back pain after a fall.  X-ray showed L1 compression fracture but also showed 5 cm distal AAA. 4/22, patient was sent to the ED at [facility] for evaluation of AAA. Ultrasound aorta showed 6 cm infrarenal AAA with large amount of mural thrombus. 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 Images that extended into the lower chest noted small subsegmental incidental pulmonary embolus in right lower lobe.   Vascular surgery was consulted Patient was started on IV heparin Patient was admitted to [facility] at [facility] Ultrasound duplex of lower extremities showed acute DVT of right posterior tibial veins.   Subjective: Patient was seen and examined this morning.   Lying on bed.  Not in distress.  Wife at bedside.   Hemoglobin slightly better at 7.3 this morning.    Assessment and plan: Large infrarenal AAA with mural thrombus Vascular surgery evaluated the patient and advised no need for immediate intervention for AAA until PE has been treated.   To follow-up in office in 6 months for repeat ultrasound.   Acute to subacute L1 compression fracture Likely secondary to fall 2 weeks prior to presentation MRI 4/26 showed acute to subacute L1 compression fracture with up to 70% vertebral body height loss and 3 mm retropulsion of the posterior cortex.  No spinal canal stenosis or neural foraminal narrowing. IR consulted. 5/1, underwent L1 balloon kyphoplasty.  Biopsy sent as well. Currently on pain management with Oxy IR    Subsegmental pulmonary  embolism Acute DVT of right posterior tibial veins PE seen in initial CT angio done for evaluation of AAA.   DVT of right posterior tibial veins are noted as well. DVT/PE suspected to be due to impaired mobility for 2 weeks after a fall.  Patient was ambulatory prior to that. Initially started on IV heparin drip.  Switched to oral Xarelto this morning.  Left psoas hematoma 4/25, patient complained of severe left thigh pain. MRI lumbar spine 4/26 also showed left psoas muscle hematoma. Looks to be self-contained.  Unclear if it was the primary etiology of pain. No longer having any pain.  Hemodynamically stable.  Continue to monitor.     Acute on chronic anemia Unclear baseline hemoglobin as the most recent blood work prior to this hospitalization was from 2020 with hemoglobin more than 12.  However ferritin level is low suggestive of chronic slow bleeding. This hospitalization, hemoglobin has been running low.   1 unit of PRBC transfusion was given.   Currently on anticoagulation.  Hemoglobin stable between 7 and 8.  If continues to remain stable, will discharge on Xarelto tomorrow.  If not, may need to consider for IVC filter. Recent Labs    12/08/22 1118 02/16/23 1059 02/23/23 0625 02/23/23 8119 02/23/23 1478 02/23/23 1539 02/24/23 0709 02/27/23 0222 02/28/23 2956 03/01/23 0343 03/02/23 0415 03/03/23 0208  HGB  --    < >  --   --  8.4*  --    < > 7.5* 7.9* 7.5* 7.1* 7.3*  MCV  --    < >  --   --  77.4*  --    < > 80.7 80.4 80.9 80.1 80.1  VITAMINB12 320  --   --   --   --  678  --   --   --   --   --   --   FOLATE  --   --  13.2  --   --   --   --   --   --   --   --   --   FERRITIN  --   --   --   --  23*  --   --   --   --   --   --   --   TIBC  --   --   --   --  377  --   --   --   --   --   --   --   IRON  --   --   --   --  16*  --   --   --   --   --   --   --   RETICCTPCT  --   --   --  2.0  --   --   --   --   --   --   --   --    < > = values in this interval not  displayed.     Essential hypertension Blood pressure stable with amlodipine, losartan   Prediabetes Hemoglobin A1c on 12/08/2022 was 5.8 Continue diet and lifestyle modification at this time   Mixed hyperlipidemia Continue Lipitor   GERD Continue Protonix   Dementia Continue Aricept, Namenda  Severe protein calorie malnutrition Declining oral intake for last year. Nutrition consult obtained. Started on supplements  Constipation Continue scheduled and as needed bowel regimen   Oral thrush Magic mouthwash  Impaired mobility Previously ambulatory but since since fall 2 weeks prior, he has been less ambulatory.  Has also suffered physical deconditioning.  PT eval obtained.  SNF recommended.  Goals of care   Code Status: DNR     DVT prophylaxis:  SCDs Start: 02/21/23 1945 Rivaroxaban (XARELTO) tablet 15 mg  rivaroxaban (XARELTO) tablet 20 mg   Antimicrobials: None Fluid: None currently Consultants: IR, vascular surgery Family Communication: Patient's wife at bedside. Status: Inpatient Level of care:  Med-Surg   Patient from: Home Anticipated d/c to: SNF Needs to continue in-hospital care:  Monitor hemoglobin, if continues to drop, may need IVC filter placed   Diet:  Diet Order             Diet regular Room service appropriate? Yes; Fluid consistency: Thin  Diet effective now                   Scheduled Meds:  (feeding supplement) PROSource Plus  30 mL Oral Daily   sodium chloride   Intravenous Once   amLODipine  5 mg Oral Daily   atorvastatin  40 mg Oral Daily   cyanocobalamin  2,000 mcg Oral Daily   diphenhydrAMINE  25 mg Oral Once   docusate sodium  100 mg Oral BID   donepezil  10 mg Oral QHS   feeding supplement  1 Container Oral Q24H   feeding supplement  237 mL Oral BID BM   ferrous sulfate  325 mg Oral Q breakfast   insulin aspart  0-9 Units Subcutaneous TID WC   lidocaine  1 patch Transdermal Q24H   losartan  100 mg Oral  Daily   magic  mouthwash  5 mL Oral TID   megestrol  20 mg Oral BID   memantine  10 mg Oral BID   multivitamin with minerals  1 tablet Oral Daily   pantoprazole  40 mg Oral Daily   polyethylene glycol  17 g Oral Daily   Rivaroxaban  15 mg Oral BID WC   Followed by   Melene Muller ON 03/16/2023] rivaroxaban  20 mg Oral Q supper    PRN meds: acetaminophen **OR** acetaminophen, hydrALAZINE, iohexol, ondansetron **OR** ondansetron (ZOFRAN) IV, oxyCODONE   Infusions:     Antimicrobials: Anti-infectives (From admission, onward)    Start     Dose/Rate Route Frequency Ordered Stop   03/02/23 1050  ceFAZolin (ANCEF) IVPB 2g/100 mL premix        over 30 Minutes Intravenous Continuous PRN 03/02/23 1058 03/02/23 1050   03/02/23 0600  ceFAZolin (ANCEF) IVPB 2g/100 mL premix       Note to Pharmacy: Do not send to floor, will be given in Radiology dept prior to procedure.   2 g 200 mL/hr over 30 Minutes Intravenous On call 03/01/23 1039 03/03/23 0600       Nutritional status:  Body mass index is 21.37 kg/m.  Nutrition Problem: Severe Malnutrition Etiology: chronic illness Signs/Symptoms: severe fat depletion, moderate fat depletion, severe muscle depletion, energy intake < or equal to 75% for > or equal to 1 month     Objective: Vitals:   03/03/23 0329 03/03/23 0801  BP: (!) 126/59 131/61  Pulse: 76 87  Resp: 16 17  Temp: 98 F (36.7 C) 98.4 F (36.9 C)  SpO2: 97% 98%    Intake/Output Summary (Last 24 hours) at 03/03/2023 1408 Last data filed at 03/03/2023 1056 Gross per 24 hour  Intake 240 ml  Output 300 ml  Net -60 ml    Filed Weights   02/21/23 1159 02/28/23 0438  Weight: 64.9 kg 61.9 kg   Weight change:  Body mass index is 21.37 kg/m.   Physical Exam: General exam: Pleasant, elderly Caucasian male.  Not in distress Skin: No rashes, lesions or ulcers. HEENT: Atraumatic, normocephalic, no obvious bleeding Lungs: Clear to auscultation bilaterally CVS: Regular rate and rhythm, no  murmur GI/Abd soft, nontender, nondistended, bowel sound present CNS: Alert, awake, oriented x 3. Psychiatry: Mood appropriate Extremities: No pedal edema, no calf tenderness  Data Review: I have personally reviewed the laboratory data and studies available.  F/u labs ordered Unresulted Labs (From admission, onward)     Start     Ordered   03/02/23 0500  CBC  Daily,   R     See Hyperspace for full Linked Orders Report.   02/28/23 1349            Total time spent in review of labs and imaging, patient evaluation, formulation of plan, documentation and communication with family: 45 minutes  Signed, Lorin Glass, MD [facility] 03/03/2023

## 2023-03-03 NOTE — Progress Notes (Signed)
Nutrition Follow-up  DOCUMENTATION CODES:   Severe malnutrition in context of chronic illness  INTERVENTION:  Continue to encourage adequate PO intake Continue Ensure Enlive po BID, each supplement provides 350 kcal and 20 grams of protein. Continue to encourage adequate PO intake Discontinue ProSource Plus and Boost Breeze MVI with minerals daily  NUTRITION DIAGNOSIS:   Severe Malnutrition related to chronic illness as evidenced by severe fat depletion, moderate fat depletion, severe muscle depletion, energy intake < or equal to 75% for > or equal to 1 month. - remains applicable  GOAL:   Patient will meet greater than or equal to 90% of their needs - progressing  MONITOR:   PO intake, Supplement acceptance, Labs, Weight trends  REASON FOR ASSESSMENT:   Malnutrition Screening Tool    ASSESSMENT:   Pt admitted from home for evaluation of abdominal aortic aneurysm. PMH significant for HTN, HLD, T2DM, GERD, dementia.  5/1 s/p L1 balloon kyphoplasty ; biopsy  Pt was planned for possible d/c however hemoglobin has been trending down throughout admission and wife reports black tarry stool. GI consulted. Plans for EGD/colonoscopy on 5/6. Pt with h/o iron deficiency anemia.   Pt in good spirits with wife present at bedside. Since kyphoplasty, pt endorses improvement in pain which in turn has helped his appetite. He endorses eating much better compared to PTA.  He enjoys Ensure over [employer]. Observed 1 ensure consumed so far today.   Meal completions:  4/25: 75% lunch 4/26: 5% lunch, 20% dinner 4/28: 100% breakfast and lunch 5/1: 100% lunch 5/2: 100% lunch  Medications: Vitamin B12, colace, ferrous sulfate, SSI 0-9 units TID, magic mouthwash, megace, MVI, protonix, miralax  Labs: CBG's 135, 217, 110, 111 x24 hours, Hgb 7.1   Diet Order:   Diet Order             Diet regular Room service appropriate? Yes; Fluid consistency: Thin  Diet effective now                    EDUCATION NEEDS:   No education needs have been identified at this time  Skin:  Skin Assessment: Reviewed RN Assessment  Last BM:  5/1 (large-type 6)  Height:   Ht Readings from Last 1 Encounters:  02/21/23 5\' 7"  (1.702 m)    Weight:   Wt Readings from Last 1 Encounters:  02/28/23 61.9 kg   BMI:  Body mass index is 21.37 kg/m.  Estimated Nutritional Needs:   Kcal:  1800-2000  Protein:  90-105g  Fluid:  >/=1.8L  Drusilla Kanner, RDN, LDN Clinical Nutrition

## 2023-03-03 NOTE — Care Management Important Message (Signed)
Important Message  Patient Details  Name: Bassem Bernasconi MRN: [id number] Date of Birth: February 27, 1946   Medicare Important Message Given:  Yes     Sherilyn Banker 03/03/2023, 12:33 PM

## 2023-03-03 NOTE — Progress Notes (Signed)
Occupational Therapy Treatment Patient Details Name: Walter [name] MRN: [id number] DOB: 15-Jun-1946 Today's Date: 03/03/2023   History of present illness 77 y.o. male presenting 4/22 with back pain, work up revealed AAA aneurysm. Hospital course complicated by finding of acute PE and was started on heparin. X-ray showed an age-indeterminate L1 compression fracture. He has also not been able to tolerate most foods for several months. PMH: CAD, DMII, esophageal stricture, GERD, HTN, DDD, HLD, and dementia.   OT comments  Pt tired today but actively participated in skilled therapy. Pt able to perform bed mobility faster today than prior session, still needs min A for sitting up/laying down. Pt able to transfer to toilet min guard, unable to perform wiping backside, min A for wiping. Pt able to perform toileting with urinal in bed. Pt displays good overall safety awareness, takes his time with tasks. Pt would benefit greatly from skilled therapy to improve activity tolerance as Pt was too tired to continue at end of session after sitting on toilet for several minutes, will be seen acutely.   Recommendations for follow up therapy are one component of a multi-disciplinary discharge planning process, led by the attending physician.  Recommendations may be updated based on patient status, additional functional criteria and insurance authorization.    Assistance Recommended at Discharge Frequent or constant Supervision/Assistance  Patient can return home with the following  A little help with walking and/or transfers;A little help with bathing/dressing/bathroom;Assistance with cooking/housework;Direct supervision/assist for medications management;Direct supervision/assist for financial management;Assist for transportation;Help with stairs or ramp for entrance   Equipment Recommendations  Other (comment) (RW)    Recommendations for Other Services      Precautions / Restrictions  Precautions Precautions: Fall Precaution Comments: shuffling gait form parkinson's Restrictions Weight Bearing Restrictions: No       Mobility Bed Mobility Overal bed mobility: Needs Assistance Bed Mobility: Supine to Sit, Sit to Supine     Supine to sit: Min assist Sit to supine: Min assist   General bed mobility comments: HHA sitting up on EOB, assist for BLEs sit to supine.    Transfers Overall transfer level: Needs assistance Equipment used: Rolling walker (2 wheels) Transfers: Sit to/from Stand, Bed to chair/wheelchair/BSC Sit to Stand: Min assist     Step pivot transfers: Min guard     General transfer comment: no LOB, decreased activity tolerance     Balance Overall balance assessment: History of Falls, Needs assistance Sitting-balance support: No upper extremity supported, Feet supported Sitting balance-Leahy Scale: Good Sitting balance - Comments: able to sit at EOB performing ADLs   Standing balance support: Bilateral upper extremity supported, During functional activity Standing balance-Leahy Scale: Poor Standing balance comment: with RW support                           ADL either performed or assessed with clinical judgement   ADL   Eating/Feeding: Set up   Grooming: Set up;Sitting                   Toilet Transfer: Minimal assistance;Ambulation;Rolling walker (2 wheels)   Toileting- Clothing Manipulation and Hygiene: Moderate assistance (requires assistance wiping)       Functional mobility during ADLs: Min guard General ADL Comments: Pt requires assistance with wiping at toilet, not able to reach back safely and perform independently.    Extremity/Trunk Assessment Upper Extremity Assessment Upper Extremity Assessment: Overall WFL for tasks assessed  Vision       Perception     Praxis      Cognition Arousal/Alertness: Awake/alert Behavior During Therapy: WFL for tasks assessed/performed Overall  Cognitive Status: History of cognitive impairments - at baseline                                 General Comments: Pt tired, but agreeable to therapy.        Exercises      Shoulder Instructions       General Comments      Pertinent Vitals/ Pain       Pain Assessment Pain Assessment: No/denies pain  Home Living                                          Prior Functioning/Environment              Frequency  Min 2X/week        Progress Toward Goals  OT Goals(current goals can now be found in the care plan section)  Progress towards OT goals: Progressing toward goals  Acute Rehab OT Goals Patient Stated Goal: to improve endurance OT Goal Formulation: With patient Time For Goal Achievement: 03/09/23 Potential to Achieve Goals: Good ADL Goals Pt Will Perform Lower Body Bathing: sit to/from stand;with supervision Pt Will Transfer to Toilet: with supervision Pt Will Perform Toileting - Clothing Manipulation and hygiene: with modified independence Additional ADL Goal #1: Pt will independently identify 3 strategies to reduce risk of falls  Plan Discharge plan remains appropriate    Co-evaluation                 AM-PAC OT "6 Clicks" Daily Activity     Outcome Measure   Help from another person eating meals?: A Little Help from another person taking care of personal grooming?: A Little Help from another person toileting, which includes using toliet, bedpan, or urinal?: A Little Help from another person bathing (including washing, rinsing, drying)?: A Little Help from another person to put on and taking off regular upper body clothing?: A Little Help from another person to put on and taking off regular lower body clothing?: A Little 6 Click Score: 18    End of Session Equipment Utilized During Treatment: Gait belt;Rolling walker (2 wheels)  OT Visit Diagnosis: Unsteadiness on feet (R26.81);Other abnormalities of gait and  mobility (R26.89);Muscle weakness (generalized) (M62.81);History of falling (Z91.81);Pain   Activity Tolerance Patient tolerated treatment well   Patient Left in bed;with call bell/phone within reach;with bed alarm set   Nurse Communication Mobility status        Time: 0981-1914 OT Time Calculation (min): 16 min  Charges: OT General Charges $OT Visit: 1 Visit OT Treatments $Self Care/Home Management : 8-22 mins  [address], OTR/L   Alexis Goodell 03/03/2023, 6:09 PM

## 2023-03-03 NOTE — Progress Notes (Signed)
ANTICOAGULATION CONSULT NOTE  Pharmacy Consult for Heparin transition to Xarelto Indication:  mural thrombus  and pulmonary embolus  No Known Allergies  Patient Measurements: Height: 5\' 7"  (170.2 cm) Weight: 61.9 kg (136 lb 7.4 oz) IBW/kg (Calculated) : 66.1 kg   Vital Signs: Temp: 98.4 F (36.9 C) (05/02 0801) Temp Source: Oral (05/02 0801) BP: 131/61 (05/02 0801) Pulse Rate: 87 (05/02 0801)  Labs: Recent Labs    03/01/23 0343 03/01/23 1443 03/02/23 0415 03/03/23 0208  HGB 7.5*  --  7.1* 7.3*  HCT 25.0*  --  23.4* 23.8*  PLT 304  --  286 284  APTT 52* 63* 68* 78*  LABPROT  --   --  14.8  --   INR  --   --  1.2  --   HEPARINUNFRC  --   --   --  0.59  CREATININE 1.15  --   --   --      Estimated Creatinine Clearance: 47.1 mL/min (by C-G formula based on SCr of 1.15 mg/dL).   Medical History: Past Medical History:  Diagnosis Date   Allergy    Cancer [facility])    Skin cancer   Cataract    Coronary artery disease    Diabetes mellitus    Diverticulosis    Esophageal stricture    GERD (gastroesophageal reflux disease)    Hyperlipidemia    Hypertension    Peripheral vascular disease (HCC)    Medication History pending.  No anticoagulation PTA.  Assessment: 77 YO M with ongoing pain s/p fall ~ 2 weeks.  Had additional imaging 4/22 which showed aneurysm for which pt was sent to the ED for further imaging.  [place] and CT showed 6 cm infrarenal abdominal aortic aneurysm is noted with a large amount of mural thrombus. Also has acute PE. Pharmacy has been consulted to dose heparin.   Last dose Xarelto 4/29 @ 1030. Heparin was held starting at 08:30 for kyphoplasty in IR on 03/02/23 and resumed at 17:30 post-procedure. Pharmacy consulted to transition patient from heparin infusion to rivaroxaban on 5/2.   aPTT 78, therapeutic Heparin level 0.59, therapeutic  Current heparin infusion rate: 1200 units/hr  Hgb 7.3, low, but stable Plt 284, remain stable No s/sx of bleeding  per RN  Goal of Therapy:  aPTT 66-102 seconds  Monitor platelets by anticoagulation protocol: Yes  Plan:  Discontinue heparin infusion at 10:00  Resume rivaroxaban load of 15mg  PO BID through 5/14, then change rivaroxaban to 20mg  PO daily with supper from 5/15 and thereafter Continue to monitor H&H and platelets Rivaroxaban copay $47 per month Pharmacy to provide patient education on rivaroxaban prior to discharge  Thank you for allowing pharmacy to be a part of this patient's care.  Wilburn Cornelia, PharmD, BCPS Clinical Pharmacist 03/03/2023 9:07 AM   Please refer to AMION for pharmacy phone number;a

## 2023-03-03 NOTE — Progress Notes (Signed)
Physical Therapy Treatment Patient Details Name: Walter [name] MRN: [id number] DOB: 1946/01/25 Today's Date: 03/03/2023   History of Present Illness 77 y.o. male presenting 4/22 with back pain, work up revealed AAA aneurysm. Hospital course complicated by finding of acute PE and was started on heparin. X-ray showed an age-indeterminate L1 compression fracture. He has also not been able to tolerate most foods for several months. PMH: CAD, DMII, esophageal stricture, GERD, HTN, DDD, HLD, and dementia.    PT Comments    Pt was seen for mobility on RW with help to maneuver and to correct posture as pt has habitually been in a kyphotic posture.  He is motivated and attentive to instructions, very appropriate for rehab setting to make good progress on balance and to work toward more functional gait.  Shuffling was most evident with turning and obstacle clearance as expected and more reciprocal with straight path walking.  Will recommend him to <3 hours a day rehab, very much a plan and will likely be going tomorrow, MD permitting.  Follow for acute PT goals as are outlined in POC.   Recommendations for follow up therapy are one component of a multi-disciplinary discharge planning process, led by the attending physician.  Recommendations may be updated based on patient status, additional functional criteria and insurance authorization.  Follow Up Recommendations  Can patient physically be transported by private vehicle: Yes    Assistance Recommended at Discharge Frequent or constant Supervision/Assistance  Patient can return home with the following A little help with walking and/or transfers;Assistance with cooking/housework;Help with stairs or ramp for entrance;A little help with bathing/dressing/bathroom;Direct supervision/assist for medications management;Assist for transportation   Equipment Recommendations  Rolling walker (2 wheels)    Recommendations for Other Services       Precautions  / Restrictions Precautions Precautions: Fall Precaution Comments: shuffled parkinson's, smoothes after turns Restrictions Weight Bearing Restrictions: No     Mobility  Bed Mobility               General bed mobility comments: in chair when PT arrives    Transfers Overall transfer level: Needs assistance Equipment used: Rolling walker (2 wheels) Transfers: Sit to/from Stand Sit to Stand: Min assist   Step pivot transfers: Min assist       General transfer comment: using good hand placement on chair    Ambulation/Gait Ambulation/Gait assistance: Min guard Gait Distance (Feet): 60 Feet Assistive device: Rolling walker (2 wheels) Gait Pattern/deviations: Trunk flexed, Decreased stride length, Wide base of support, Shuffle Gait velocity: reduced Gait velocity interpretation: <1.31 ft/sec, indicative of household ambulator Pre-gait activities: standing posture correction General Gait Details: pt is most likely to shuffle with turns and takes more appropriate reciprocal steps after turn   Stairs             Wheelchair Mobility    Modified Rankin (Stroke Patients Only)       Balance Overall balance assessment: History of Falls, Needs assistance Sitting-balance support: Feet supported Sitting balance-Leahy Scale: Good Sitting balance - Comments: able to sit at EOB performing ADLs   Standing balance support: Bilateral upper extremity supported, During functional activity Standing balance-Leahy Scale: Poor Standing balance comment: with RW support                            Cognition Arousal/Alertness: Awake/alert Behavior During Therapy: WFL for tasks assessed/performed Overall Cognitive Status: History of cognitive impairments - at baseline  General Comments: agreed readily to walk        Exercises General Exercises - Lower Extremity Ankle Circles/Pumps: AAROM, 5 reps Quad Sets: AROM, 10  reps Gluteal Sets: AROM, 10 reps Long Arc Quad: Strengthening, 10 reps Heel Slides: Strengthening, 10 reps    General Comments General comments (skin integrity, edema, etc.): Pt was able to demonstrate a good amount of correction of posture post kyphoplasty today, much more ready to progress with his plan for rehab      Pertinent Vitals/Pain Pain Assessment Pain Assessment: No/denies pain    Home Living                          Prior Function            PT Goals (current goals can now be found in the care plan section) Progress towards PT goals: Progressing toward goals    Frequency    Min 3X/week      PT Plan Current plan remains appropriate    Co-evaluation              AM-PAC PT "6 Clicks" Mobility   Outcome Measure  Help needed turning from your back to your side while in a flat bed without using bedrails?: None Help needed moving from lying on your back to sitting on the side of a flat bed without using bedrails?: A Little Help needed moving to and from a bed to a chair (including a wheelchair)?: A Little Help needed standing up from a chair using your arms (e.g., wheelchair or bedside chair)?: A Little Help needed to walk in hospital room?: A Little Help needed climbing 3-5 steps with a railing? : A Lot 6 Click Score: 18    End of Session Equipment Utilized During Treatment: Gait belt Activity Tolerance: Patient tolerated treatment well Patient left: in chair;with chair alarm set;with family/visitor present Nurse Communication: Mobility status PT Visit Diagnosis: Unsteadiness on feet (R26.81);Other abnormalities of gait and mobility (R26.89);History of falling (Z91.81)     Time: 0981-1914 PT Time Calculation (min) (ACUTE ONLY): 28 min  Charges:  $Gait Training: 8-22 mins $Therapeutic Exercise: 8-22 mins        Ivar Drape 03/03/2023, 6:41 PM  Samul Dada, PT PhD Acute Rehab Dept. Number: [facility] [id number] and [facility] [phone]

## 2023-03-03 NOTE — TOC Progression Note (Signed)
Transition of Care [facility]) - Progression Note    Patient Details  Name: Walter [name] MRN: [id number] Date of Birth: 15-Mar-1946  Transition of [facility]) CM/SW Contact  Carley Hammed, LCSW Phone Number: 03/03/2023, 2:31 PM  Clinical Narrative:     CSW was notified by MD that pt should DC tomorrow. CSW updated facility. CSW met with pt and spouse at bedside and discussed next steps in SNF process. TOC will continue to follow for DC needs.   Expected Discharge Plan: Skilled Nursing Facility Barriers to Discharge: Continued Medical Work up, SNF Pending bed offer, English as a second language teacher  Expected Discharge Plan and Services     Post Acute Care Choice: Skilled Nursing Facility Living arrangements for the past 2 months: Single Family Home                                       Social Determinants of Health (SDOH) Interventions SDOH Screenings   Food Insecurity: Patient Declined (02/22/2023)  Housing: Low Risk  (02/22/2023)  Transportation Needs: Patient Declined (02/22/2023)  Utilities: Patient Declined (02/22/2023)  Alcohol Screen: Low Risk  (04/23/2022)  Depression (PHQ2-9): High Risk (02/16/2023)  Financial Resource Strain: Low Risk  (04/23/2022)  Physical Activity: Inactive (04/23/2022)  Social Connections: Socially Integrated (04/23/2022)  Stress: No Stress Concern Present (04/23/2022)  Tobacco Use: Medium Risk (02/21/2023)    Readmission Risk Interventions     No data to display

## 2023-03-04 DIAGNOSIS — R6881 Early satiety: Secondary | ICD-10-CM | POA: Diagnosis not present

## 2023-03-04 DIAGNOSIS — K921 Melena: Secondary | ICD-10-CM | POA: Diagnosis not present

## 2023-03-04 DIAGNOSIS — R634 Abnormal weight loss: Secondary | ICD-10-CM | POA: Diagnosis not present

## 2023-03-04 DIAGNOSIS — D509 Iron deficiency anemia, unspecified: Secondary | ICD-10-CM | POA: Diagnosis not present

## 2023-03-04 DIAGNOSIS — I714 Abdominal aortic aneurysm, without rupture, unspecified: Secondary | ICD-10-CM | POA: Diagnosis not present

## 2023-03-04 DIAGNOSIS — R630 Anorexia: Secondary | ICD-10-CM

## 2023-03-04 LAB — GLUCOSE, CAPILLARY
Glucose-Capillary: 115 mg/dL — ABNORMAL HIGH (ref 70–99)
Glucose-Capillary: 125 mg/dL — ABNORMAL HIGH (ref 70–99)
Glucose-Capillary: 195 mg/dL — ABNORMAL HIGH (ref 70–99)
Glucose-Capillary: 244 mg/dL — ABNORMAL HIGH (ref 70–99)
Glucose-Capillary: 140 mg/dL — ABNORMAL HIGH (ref 70–99)
Glucose-Capillary: 184 mg/dL — ABNORMAL HIGH (ref 70–99)
Glucose-Capillary: 121 mg/dL — ABNORMAL HIGH (ref 70–99)

## 2023-03-04 LAB — HEPARIN LEVEL (UNFRACTIONATED): Heparin Unfractionated: >1.1 IU/mL — ABNORMAL HIGH (ref 0.30–0.70)

## 2023-03-04 LAB — CBC
HCT: 24.5 % — ABNORMAL LOW (ref 39.0–52.0)
Hemoglobin: 7.1 g/dL — ABNORMAL LOW (ref 13.0–17.0)
MCH: 24.1 pg — ABNORMAL LOW (ref 26.0–34.0)
MCHC: 29 g/dL — ABNORMAL LOW (ref 30.0–36.0)
MCV: 83.3 fL (ref 80.0–100.0)
Platelets: 282 10*3/uL (ref 150–400)
RBC: 2.94 MIL/uL — ABNORMAL LOW (ref 4.22–5.81)
RDW: 20.7 % — ABNORMAL HIGH (ref 11.5–15.5)
WBC: 8.1 10*3/uL (ref 4.0–10.5)
nRBC: 0 % (ref 0.0–0.2)

## 2023-03-04 LAB — OCCULT BLOOD X 1 CARD TO LAB, STOOL: Fecal Occult Bld: NEGATIVE

## 2023-03-04 LAB — SURGICAL PATHOLOGY

## 2023-03-04 LAB — APTT: aPTT: 43 seconds — ABNORMAL HIGH (ref 24–36)

## 2023-03-04 MED ORDER — HEPARIN (PORCINE) 25000 UT/250ML-% IV SOLN
1250.0000 [IU]/h | INTRAVENOUS | Status: DC
  Administered 2023-03-04: 1000 [IU]/h via INTRAVENOUS
  Administered 2023-03-05: 1200 [IU]/h via INTRAVENOUS
  Filled 2023-03-04 (×2): qty 250

## 2023-03-04 NOTE — Consult Note (Addendum)
Attending physician's note   I have taken a history, reviewed the chart, and examined the patient. I performed a substantive portion of this encounter, including complete performance of at least one of the key components, in conjunction with the APP. I agree with the APP's note, impression, and recommendations with my edits.   77 year old male with history of diabetes, HTN, HLD, CAD, PVD, GERD, AAA, diverticulosis, admitted 02/21/2023 with L1 vertebral fracture after a fall, and also noted to have DVT and PE on admission workup.  Was started on IV heparin, then converted to Xarelto on 03/03/2023.  He was noted to have melenic stool and slight decrement in H/H.  His anemia actually extends to prior to admission, and was being actively evaluated by his PCM 1 week prior to admission. - 4/17: H/H 7.4/24.8, MCV/RDW 75/18.7 - Comparison lab from 04/2019: H/H 12/37, MCV/RDW 87/14  Admission on 4/22: - H/H 7.6/27.2, MCV/RDW 81/18 - Ferritin 23, iron 16, TIBC 377, sat 4% - Normal B12/folate - Started heparin GTT.   Hgb decreased to 6.7 and was transfused 1 unit PRBCs on 4/23  H/H slowly down trended during this admission, and currently 7.1/24 today.  Does endorse having melena during this admission, but unclear if this was happening prior to the admission as well.  Does endorse early satiety, decreased appetite, belching.  1) Iron deficiency anemia 2) Melena Clearly has iron deficiency anemia that was present prior to hospital admission, possibly with component of exacerbation since admission and being started on systemic anticoagulation for DVT/PE.  Discussed DDx with patient and his spouse at bedside with plan for EGD/colonoscopy on this admission.  Unfortunately, no anesthesia support this weekend outside of emergent cases.  He is otherwise HD stable. - Plan for EGD/colonoscopy on 03/07/2023 with Dr. Rhea Belton - Clears on Sunday 03/06/2023 with bowel prep that afternoon/evening and n.p.o. at midnight  for procedures on Monday - Continue CBC checks with blood products as needed per protocol - Given new DVT/PE and mural thrombus in his infrarenal aortic aneurysm, will transition back to heparin gtt. - Hold heparin 4 hours prior to procedures on Monday - IV iron - Reasonable to treat with high-dose PPI for the time being  3) RLE DVT 4) PE 5) Mural thrombus in 6 cm infrarenal abdominal aortic aneurysm -Transitioning to heparin gtt. with close monitoring of H/H - Was evaluated by Vascular Surgery and advised no need for immediate intervention for AAA until PE has been treated with repeat ultrasound in 6 months  6) L1 compression fracture - Underwent L1 balloon kyphoplasty and biopsy on 5/1 - Management per primary Hospital service and IR  7) Weight loss 8) Decreased appetite 9) Early satiety - Plan for endoscopic evaluation as above - No obvious GI pathology on CT angio on admission   The indications, risks, and benefits of EGD and colonoscopy were explained to the patient in detail. Risks include but are not limited to bleeding, perforation, adverse reaction to medications, and cardiopulmonary compromise. Sequelae include but are not limited to the possibility of surgery, hospitalization, and mortality. The patient verbalized understanding and wished to proceed.    [address] DO, [place] [phone] office         Consultation  Referring Provider: TRH/ Dahal MD Primary Care Physician:  Kristian Covey, MD Primary Gastroenterologist:  Dr.Danis  Reason for Consultation: Anemia, iron deficiency  HPI: Walter [name] is a 77 y.o. male who we are asked to see for iron deficiency  anemia, and persistently drifting hemoglobin requiring periodic transfusions. Patient was initially admitted on 02/21/2023 after a fall at home which caused back pain.  He was diagnosed with a T1 compression fracture with retropulsion.  He did undergo kyphoplasty on 03/02/2023 and has had good  relief of his back pain and is now able to get up and ambulate with help. Also at the time of admission workup revealed a small PE and subsequent Dopplers pertinent for TEE in the right posterior tibial.  He had been on IV heparin since hospitalization and was just converted to Xarelto yesterday.  He had just been found to be anemic as an outpatient and PCP was in the process of working this up.  He had had labs on 02/16/2023 showing WBC of 11.2/hemoglobin 7.4/hematocrit 24.8 and MCV of 75. On the day of admission hemoglobin 7.6/BUN 27/creatinine 1.38 Hemoglobin drifted to 6.7 and he received 1 unit of packed RBCs with bump up to 8.4. On 03/02/2023 hemoglobin 7.1/yesterday hemoglobin 7.3, and today hemoglobin 7.1. On admit ferritin 23/serum iron 16/TIBC 377/iron sat 4  Pt's  wife believes that he did get IV iron while he was at [facility] awaiting transfer here. Patient has other comorbidities including significant peripheral vascular disease, coronary artery disease status post remote stent in 2003, adult onset diabetes mellitus, chronic GERD, and Parkinson's.  Last underwent colonoscopy in December 2020 per Dr. Myrtie Neither with finding of multiple diverticuli throughout the colon no polyps and at that time was not felt to be indicated for follow-up screening. Last EGD remote 2013 Dr. Juanda Chance with a 3 cm hiatal hernia and distal stricture which was dilated.  Abdominal imaging with CT abdomen pelvis on admission showing a large infrarenal abdominal aortic aneurysm at 5.9 cm, diffuse diverticulosis, occlusion of the right external iliac and right common femoral  MRI of the back on 02/25/2023 had shown a T1 compression fracture with retropulsion and a fluid collection in the left psoas measuring 4 x 2.6 cm consistent with hematoma  Patient's wife says his appetite has not been good recently and that he seems to fill up quickly, also has a lot of belching and burping at home.  They both think he has lost about  30 pounds in the past year.  Stools have been very dark to black over the past couple of weeks, patient not sure whether he was having dark stools at home prior to admission   Past Medical History:  Diagnosis Date   Allergy    Cancer [facility])    Skin cancer   Cataract    Coronary artery disease    Diabetes mellitus    Diverticulosis    Esophageal stricture    GERD (gastroesophageal reflux disease)    Hyperlipidemia    Hypertension    Peripheral vascular disease (HCC)     Past Surgical History:  Procedure Laterality Date   CORONARY ANGIOPLASTY WITH STENT PLACEMENT     ESOPHAGOGASTRODUODENOSCOPY  06/07/2012   Procedure: ESOPHAGOGASTRODUODENOSCOPY (EGD);  Surgeon: Hart Carwin, MD;  Location: [facility];  Service: Endoscopy;  Laterality: N/A;   IR KYPHO LUMBAR INC FX REDUCE BONE BX UNI/BIL CANNULATION INC/IMAGING  03/02/2023   SHOULDER SURGERY  2006    Prior to Admission medications   Medication Sig Start Date End Date Taking? Authorizing Provider  amLODipine (NORVASC) 5 MG tablet Take 1 tablet by mouth once daily Patient taking differently: Take 5 mg by mouth daily. 01/24/23  Yes Burchette, Elberta Fortis, MD  atorvastatin (LIPITOR) 40 MG  tablet Take 1 tablet by mouth once daily Patient taking differently: Take 40 mg by mouth daily. 09/20/22  Yes Burchette, Elberta Fortis, MD  cyanocobalamin 2000 MCG tablet Take 1 tablet (2,000 mcg total) by mouth daily. 04/24/19  Yes Waldon Merl, PA-C  Dihydroxyaluminum Sod Carb (ROLAIDS PO) Take 1 tablet by mouth as needed (heartburn).   Yes [provider]  donepezil (ARICEPT) 10 MG tablet Take 1 tablet daily Patient taking differently: Take 10 mg by mouth daily. 10/29/22  Yes Van Clines, MD  Esomeprazole Magnesium (NEXIUM PO) Take 1 tablet by mouth daily as needed (acid reflux).   Yes [provider]  Iron, Ferrous Sulfate, 325 (65 Fe) MG TABS Take 325 mg by mouth daily. 02/16/23  Yes [place], Betty G, MD  losartan (COZAAR) 100 MG  tablet Take 1 tablet by mouth once daily 12/13/22  Yes Burchette, Elberta Fortis, MD  memantine (NAMENDA) 10 MG tablet Take 1 tablet twice a day Patient taking differently: Take 10 mg by mouth 2 (two) times daily. 10/29/22  Yes Van Clines, MD  metFORMIN (GLUCOPHAGE) 1000 MG tablet Take 1 tablet (1,000 mg total) by mouth 2 (two) times daily with a meal. 02/16/23  Yes [place], Betty G, MD  OVER THE COUNTER MEDICATION Take 1 tablet by mouth daily. Allergy relief   Yes [provider]  tiZANidine (ZANAFLEX) 4 MG tablet Take 0.5-1 tablets (2-4 mg total) by mouth at bedtime as needed for muscle spasms. 02/16/23 03/18/23 Yes [place], Betty G, MD  Blood Glucose Monitoring Suppl (ONE TOUCH ULTRA 2) w/Device KIT 1 kit by Does not apply route daily. 07/21/20   Waldon Merl, PA-C  glucose blood (ONETOUCH ULTRA) test strip USE 1 STRIP TO CHECK GLUCOSE ONCE DAILY 12/20/22   Burchette, Elberta Fortis, MD  Lancets [facility] ULTRASOFT) lancets Check blood sugars once daily. Dx: E11.51 07/21/20   Waldon Merl, PA-C    Current Facility-Administered Medications  Medication Dose Route Frequency Provider Last Rate Last Admin   (feeding supplement) PROSource Plus liquid 30 mL  30 mL Oral Daily Hollice Espy, MD   30 mL at 03/04/23 1017   0.9 %  sodium chloride infusion (Manually program via Guardrails IV Fluids)   Intravenous Once Adefeso, Oladapo, DO       acetaminophen (TYLENOL) tablet 650 mg  650 mg Oral Q6H PRN Adefeso, Oladapo, DO   650 mg at 03/02/23 2053   Or   acetaminophen (TYLENOL) suppository 650 mg  650 mg Rectal Q6H PRN Adefeso, Oladapo, DO       amLODipine (NORVASC) tablet 5 mg  5 mg Oral Daily Adefeso, Oladapo, DO   5 mg at 03/04/23 1019   atorvastatin (LIPITOR) tablet 40 mg  40 mg Oral Daily Adefeso, Oladapo, DO   40 mg at 03/04/23 1019   cyanocobalamin (VITAMIN B12) tablet 2,000 mcg  2,000 mcg Oral Daily Adefeso, Oladapo, DO   2,000 mcg at 03/04/23 1018   diphenhydrAMINE (BENADRYL) capsule 25 mg   25 mg Oral Once Luiz Iron, NP       docusate sodium (COLACE) capsule 100 mg  100 mg Oral BID Virginia Rochester K, MD   100 mg at 03/04/23 1018   donepezil (ARICEPT) tablet 10 mg  10 mg Oral QHS Adefeso, Oladapo, DO   10 mg at 03/03/23 2150   feeding supplement (BOOST / RESOURCE BREEZE) liquid 1 Container  1 Container Oral Q24H Hollice Espy, MD   1 Container at 03/03/23  2152   feeding supplement (ENSURE ENLIVE / ENSURE PLUS) liquid 237 mL  237 mL Oral BID BM Hollice Espy, MD   237 mL at 03/04/23 1019   ferrous sulfate tablet 325 mg  325 mg Oral Q breakfast Adefeso, Oladapo, DO   325 mg at 03/04/23 1478   hydrALAZINE (APRESOLINE) tablet 10 mg  10 mg Oral Q6H PRN Hollice Espy, MD       insulin aspart (novoLOG) injection 0-9 Units  0-9 Units Subcutaneous TID WC Hollice Espy, MD   3 Units at 03/04/23 0836   iohexol (OMNIPAQUE) 300 MG/ML solution 50 mL  50 mL Intra-arterial Once PRN Julieanne Cotton, MD       lidocaine (LIDODERM) 5 % 1 patch  1 patch Transdermal Q24H Doristine Counter, RPH   1 patch at 03/03/23 2150   losartan (COZAAR) tablet 100 mg  100 mg Oral Daily Adefeso, Oladapo, DO   100 mg at 03/04/23 1018   magic mouthwash  5 mL Oral TID Hollice Espy, MD   5 mL at 03/04/23 1019   megestrol (MEGACE) tablet 20 mg  20 mg Oral BID Hollice Espy, MD   20 mg at 03/04/23 1017   memantine (NAMENDA) tablet 10 mg  10 mg Oral BID Adefeso, Oladapo, DO   10 mg at 03/04/23 1018   multivitamin with minerals tablet 1 tablet  1 tablet Oral Daily Hollice Espy, MD   1 tablet at 03/04/23 1018   ondansetron (ZOFRAN) tablet 4 mg  4 mg Oral Q6H PRN Frankey Shown, DO       Or   ondansetron (ZOFRAN) injection 4 mg  4 mg Intravenous Q6H PRN Adefeso, Oladapo, DO   4 mg at 02/27/23 0950   oxyCODONE (Oxy IR/ROXICODONE) immediate release tablet 2.5 mg  2.5 mg Oral Q4H PRN Hollice Espy, MD   2.5 mg at 03/02/23 2956   pantoprazole (PROTONIX) EC tablet 40 mg  40 mg Oral  Daily Adefeso, Oladapo, DO   40 mg at 03/04/23 1018   polyethylene glycol (MIRALAX / GLYCOLAX) packet 17 g  17 g Oral Daily Hollice Espy, MD   17 g at 03/03/23 1058    Allergies as of 02/21/2023   (No Known Allergies)    Family History  Problem Relation Age of Onset   Breast cancer Mother    Healthy Son    Healthy Son    Parkinson's disease Sister    Colon cancer Neg Hx    Esophageal cancer Neg Hx    Rectal cancer Neg Hx    Stomach cancer Neg Hx     Social History   Socioeconomic History   Marital status: Married    Spouse name: Not on file   Number of children: 3   Years of education: Not on file   Highest education level: Not on file  Occupational History   Occupation: Auditor  Tobacco Use   Smoking status: Former    Types: Cigarettes    Quit date: 11/02/1999    Years since quitting: 23.3   Smokeless tobacco: Never  Vaping Use   Vaping Use: Never used  Substance and Sexual Activity   Alcohol use: No   Drug use: No   Sexual activity: Not Currently    Partners: Female  Other Topics Concern   Not on file  Social History Narrative   Daily caffeine       Right handed      Lives  with wife in a one story home      Social Determinants of Health   Financial Resource Strain: Low Risk  (04/23/2022)   Overall Financial Resource Strain (CARDIA)    Difficulty of Paying Living Expenses: Not hard at all  Food Insecurity: Patient Declined (02/22/2023)   Hunger Vital Sign    Worried About Running Out of Food in the Last Year: Patient declined    Ran Out of Food in the Last Year: Patient declined  Transportation Needs: Patient Declined (02/22/2023)   PRAPARE - Administrator, Civil Service (Medical): Patient declined    Lack of Transportation (Non-Medical): Patient declined  Physical Activity: Inactive (04/23/2022)   Exercise Vital Sign    Days of Exercise per Week: 0 days    Minutes of Exercise per Session: 0 min  Stress: No Stress Concern Present  (04/23/2022)   [employer] of Occupational Health - Occupational Stress Questionnaire    Feeling of Stress : Not at all  Social Connections: Socially Integrated (04/23/2022)   Social Connection and Isolation Panel [NHANES]    Frequency of Communication with Friends and Family: More than three times a week    Frequency of Social Gatherings with Friends and Family: More than three times a week    Attends Religious Services: More than 4 times per year    Active Member of [employer] or Organizations: Yes    Attends Banker Meetings: More than 4 times per year    Marital Status: Married  Catering manager Violence: Patient Declined (02/22/2023)   Humiliation, Afraid, Rape, and Kick questionnaire    Fear of Current or Ex-Partner: Patient declined    Emotionally Abused: Patient declined    Physically Abused: Patient declined    Sexually Abused: Patient declined    Review of Systems: Pertinent positive and negative review of systems were noted in the above HPI section.  All other review of systems was otherwise negative.   Physical Exam: Vital signs in last 24 hours: Temp:  [98 F (36.7 C)-98.7 F (37.1 C)] 98.7 F (37.1 C) (05/03 0800) Pulse Rate:  [79-107] 107 (05/03 0800) Resp:  [16-17] 16 (05/03 0800) BP: (125-133)/(53-68) 133/68 (05/03 0800) SpO2:  [96 %-100 %] 100 % (05/03 0800) Last BM Date : 03/02/23 General:   Alert,  Well-developed, ill-appearing elderly white male ,pleasant and cooperative in NAD, wife at bedside Head:  Normocephalic and atraumatic.  Parkinson's facies Eyes:  Sclera clear, no icterus.   Conjunctiva pale Ears:  Normal auditory acuity. Nose:  No deformity, discharge,  or lesions. Mouth:  No deformity or lesions.   Neck:  Supple; no masses or thyromegaly. Lungs:  Clear throughout to auscultation.   No wheezes, crackles, or rhonchi.  Heart:  Regular rate and rhythm; no murmurs, clicks, rubs,  or gallops. Abdomen:  Soft,nontender, BS active,nonpalp  mass or hsm.  No prominent aortic pulsation Rectal: Stool for occult blood sent and pending Msk:  Symmetrical without gross deformities. . Pulses:  Normal pulses noted. Extremities:  Without clubbing or edema. Neurologic:  Alert and  oriented x4;  grossly normal neurologically. Skin:  Intact without significant lesions or rashes.. Psych:  Alert and cooperative. Normal mood and affect.  Intake/Output from previous day: 05/02 0701 - 05/03 0700 In: 360 [P.O.:360] Out: 750 [Urine:750] Intake/Output this shift: No intake/output data recorded.  Lab Results: Recent Labs    03/02/23 0415 03/03/23 0208 03/04/23 0432  WBC 10.4 9.2 8.1  HGB 7.1* 7.3* 7.1*  HCT  23.4* 23.8* 24.5*  PLT 286 284 282   BMET No results for input(s): "NA", "K", "CL", "CO2", "GLUCOSE", "BUN", "CREATININE", "CALCIUM" in the last 72 hours. LFT No results for input(s): "PROT", "ALBUMIN", "AST", "ALT", "ALKPHOS", "BILITOT", "BILIDIR", "IBILI" in the last 72 hours. PT/INR Recent Labs    03/02/23 0415  LABPROT 14.8  INR 1.2   Hepatitis Panel No results for input(s): "HEPBSAG", "HCVAB", "HEPAIGM", "HEPBIGM" in the last 72 hours.  IMPRESSION:  #67 77 year old white male with new iron deficiency anemia, anemia documented just prior to hospitalization. Patient had not been anticoagulated prior to admission, appetite recently poor, and weight loss perhaps 30 pounds over the past year or so On chronic PPI at home  Etiology of iron deficiency anemia is not clear, last colonoscopy 2020 with pandiverticulosis, no polyps.  Remote EGD for esophageal stricture and dilation 2013 CT imaging of the abdomen this admission-no evidence of malignancy, he does have a 5.9 cm infrarenal aortic aneurysm.  Rule out occult malignancy, rule out AVM's, chronic ulcer disease  #2 fall at home with acute L1 compression fracture-that is post kyphoplasty 03/02/2023 with good relief of pain #3 new right lower extremity DVT and PE-had been on  IV heparin, now on Xarelto initiated yesterday  #4 coronary artery disease status post remote stents #5.  Peripheral vascular disease #6.  Adult onset diabetes mellitus #7.  Parkinson's disease   PLAN: Patient will need EGD and colonoscopy this admission.  May need to bridge with IV heparin if holding Xarelto, will discuss with medicine team. There may be limited anesthesia availability this weekend in which case procedure could be done early next week.  Would give IV iron if that had not been done earlier in this admission at [facility] Transfuse to keep hemoglobin closer to 8 given advanced age, and need for anticoagulation  GI will follow with you.   Amy Esterwood PA-C 03/04/2023, 11:22 AM

## 2023-03-04 NOTE — Progress Notes (Signed)
Physical Therapy Treatment Patient Details Name: Walter [name] MRN: [id number] DOB: Jul 03, 1946 Today's Date: 03/04/2023   History of Present Illness 77 y.o. male presenting 4/22 with back pain, work up revealed AAA aneurysm. Hospital course complicated by finding of acute PE and was started on heparin. X-ray showed an age-indeterminate L1 compression fracture. He has also not been able to tolerate most foods for several months. PMH: CAD, DMII, esophageal stricture, GERD, HTN, DDD, HLD, and dementia.    PT Comments    Pt was seen for mobility on RW and instruction for ROM to benefit initiation of gait after sitting awhile.  He is in attendance of wife who is aware of his current functional status, and is able to make notes on his care.  Will continue to see him for progressing gait, for strengthening legs and making safety a priority with transfers and turning in hallway.  Follow acutely and expect him to transfer to SNF soon.  Follow along with him to work on acute PT goals on POC.   Recommendations for follow up therapy are one component of a multi-disciplinary discharge planning process, led by the attending physician.  Recommendations may be updated based on patient status, additional functional criteria and insurance authorization.  Follow Up Recommendations  Can patient physically be transported by private vehicle: Yes    Assistance Recommended at Discharge Frequent or constant Supervision/Assistance  Patient can return home with the following A little help with walking and/or transfers;Assistance with cooking/housework;Help with stairs or ramp for entrance;A little help with bathing/dressing/bathroom;Direct supervision/assist for medications management;Assist for transportation   Equipment Recommendations  Rolling walker (2 wheels)    Recommendations for Other Services       Precautions / Restrictions Precautions Precautions: Fall Precaution Comments: Parkinson's  gait Restrictions Weight Bearing Restrictions: No     Mobility  Bed Mobility               General bed mobility comments: in chair when PT arrives    Transfers Overall transfer level: Needs assistance Equipment used: Rolling walker (2 wheels) Transfers: Sit to/from Stand Sit to Stand: Min guard           General transfer comment: using good hand placement on chair    Ambulation/Gait Ambulation/Gait assistance: Min guard Gait Distance (Feet): 130 Feet Assistive device: Rolling walker (2 wheels) Gait Pattern/deviations: Trunk flexed, Decreased stride length, Wide base of support, Shuffle Gait velocity: reduced Gait velocity interpretation: <1.31 ft/sec, indicative of household ambulator Pre-gait activities: standing posture ck General Gait Details: requires about three seconds to smooth off gait   Stairs             Wheelchair Mobility    Modified Rankin (Stroke Patients Only)       Balance Overall balance assessment: History of Falls, Needs assistance Sitting-balance support: Feet supported Sitting balance-Leahy Scale: Good       Standing balance-Leahy Scale: Poor Standing balance comment: with RW support                            Cognition Arousal/Alertness: Awake/alert Behavior During Therapy: WFL for tasks assessed/performed Overall Cognitive Status: History of cognitive impairments - at baseline                                          Exercises General Exercises - Lower Extremity  Ankle Circles/Pumps: AROM, 5 reps Quad Sets: AROM, 10 reps Gluteal Sets: AROM, 10 reps    General Comments General comments (skin integrity, edema, etc.): Pt was seen for mobility on RW with instructions for safety and cued reciprocal pattern      Pertinent Vitals/Pain Pain Assessment Pain Assessment: No/denies pain    Home Living                          Prior Function            PT Goals (current goals  can now be found in the care plan section) Acute Rehab PT Goals Patient Stated Goal: get home eventually Progress towards PT goals: Progressing toward goals    Frequency    Min 3X/week      PT Plan Current plan remains appropriate    Co-evaluation              AM-PAC PT "6 Clicks" Mobility   Outcome Measure  Help needed turning from your back to your side while in a flat bed without using bedrails?: None Help needed moving from lying on your back to sitting on the side of a flat bed without using bedrails?: A Little Help needed moving to and from a bed to a chair (including a wheelchair)?: A Little Help needed standing up from a chair using your arms (e.g., wheelchair or bedside chair)?: A Little Help needed to walk in hospital room?: A Little Help needed climbing 3-5 steps with a railing? : A Lot 6 Click Score: 18    End of Session Equipment Utilized During Treatment: Gait belt Activity Tolerance: Patient tolerated treatment well;Patient limited by fatigue Patient left: in chair;with call bell/phone within reach;with chair alarm set;with family/visitor present Nurse Communication: Mobility status PT Visit Diagnosis: Unsteadiness on feet (R26.81);Other abnormalities of gait and mobility (R26.89);History of falling (Z91.81)     Time: 1610-9604 PT Time Calculation (min) (ACUTE ONLY): 23 min  Charges:  $Gait Training: 8-22 mins $Therapeutic Exercise: 8-22 mins          Ivar Drape 03/04/2023, 5:31 PM  Samul Dada, PT PhD Acute Rehab Dept. Number: [facility] [id number] and [facility] [phone]

## 2023-03-04 NOTE — Progress Notes (Signed)
ANTICOAGULATION CONSULT NOTE  Pharmacy Xarelto transition to heparin gtt Indication:  mural thrombus  and pulmonary embolus  No Known Allergies  Patient Measurements: Height: 5\' 7"  (170.2 cm) Weight: 61.9 kg (136 lb 7.4 oz) IBW/kg (Calculated) : 66.1 kg   Vital Signs: Temp: 98.7 F (37.1 C) (05/03 0800) Temp Source: Oral (05/03 0800) BP: 133/68 (05/03 0800) Pulse Rate: 107 (05/03 0800)  Labs: Recent Labs    03/01/23 1443 03/02/23 0415 03/02/23 0415 03/03/23 0208 03/04/23 0432  HGB  --  7.1*   < > 7.3* 7.1*  HCT  --  23.4*  --  23.8* 24.5*  PLT  --  286  --  284 282  APTT 63* 68*  --  78*  --   LABPROT  --  14.8  --   --   --   INR  --  1.2  --   --   --   HEPARINUNFRC  --   --   --  0.59  --    < > = values in this interval not displayed.     Estimated Creatinine Clearance: 47.1 mL/min (by C-G formula based on SCr of 1.15 mg/dL).   Medical History: Past Medical History:  Diagnosis Date   Allergy    Cancer [facility])    Skin cancer   Cataract    Coronary artery disease    Diabetes mellitus    Diverticulosis    Esophageal stricture    GERD (gastroesophageal reflux disease)    Hyperlipidemia    Hypertension    Peripheral vascular disease (HCC)    Medication History pending.  No anticoagulation PTA.  Assessment: 77 YO M with ongoing pain s/p fall ~ 2 weeks.  Had additional imaging 4/22 which showed aneurysm for which pt was sent to the ED for further imaging.  [place] and CT showed 6 cm infrarenal abdominal aortic aneurysm is noted with a large amount of mural thrombus. Also has acute PE. Pharmacy has been consulted to dose heparin.   Last dose Xarelto 5/3 @ 0834. Pharmacy consulted to transition patient from Xarelto to heparin gtt on 5/3  03/04/2023 PM update: Hgb 7.1, low, but stable Plt 282, remain stable No s/sx of bleeding per RN but there is a concern of a GI bleed from provider.   Goal of Therapy:  HL 0.3-0.7 aPTT 66-102 seconds  Monitor platelets by  anticoagulation protocol: Yes  Plan:  DC Xarelto- done aPTT and HL now Restart heparin 1000 units/hr at 1700 on 03/04/2023 Continue to monitor H&H and platelets   Thank you for allowing pharmacy to be a part of this patient's care.  Greta Doom BS, PharmD, BCPS Clinical Pharmacist 03/04/2023 2:02 PM  Contact: [phone] after 3 PM  "Be curious, not judgmental..." -Debbora Dus

## 2023-03-04 NOTE — Progress Notes (Signed)
PROGRESS NOTE  Sunday Corn  DOB: 02/04/1946  PCP: Kristian Covey, MD OZH:[id number]  DOA: 02/21/2023  LOS: 11 days  Hospital Day: 12  Brief narrative: Walter [name] is a 77 y.o. male with PMH significant for DM2, HTN, HLD, CAD, PVD, dementia, GERD, diverticulosis. 4/17, patient had a lumbar spine x-ray done for 2 weeks of lower back pain after a fall.  X-ray showed L1 compression fracture but also showed 5 cm distal AAA. 4/22, patient was sent to the ED at [facility] for evaluation of AAA. Ultrasound aorta showed 6 cm infrarenal AAA with large amount of mural thrombus. 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 Images that extended into the lower chest noted small subsegmental incidental pulmonary embolus in right lower lobe.   Vascular surgery was consulted Patient was started on IV heparin Patient was admitted to [facility] at [facility] Ultrasound duplex of lower extremities showed acute DVT of right posterior tibial veins.   Subjective: Patient was seen and examined this morning.   Lying on bed.  Not in distress.  Wife at bedside.   Hemoglobin down to 7.1 today.  Wife also mentions black tarry stool.  Assessment and plan: Large infrarenal AAA with mural thrombus Vascular surgery evaluated the patient and advised no need for immediate intervention for AAA until PE has been treated.   To follow-up in office in 6 months for repeat ultrasound.   Acute to subacute L1 compression fracture Likely secondary to fall 2 weeks prior to presentation MRI 4/26 showed acute to subacute L1 compression fracture with up to 70% vertebral body height loss and 3 mm retropulsion of the posterior cortex.  No spinal canal stenosis or neural foraminal narrowing. IR consulted. 5/1, underwent L1 balloon kyphoplasty.  Biopsy sent as well. Currently on pain management with Oxy IR    Subsegmental  pulmonary embolism Acute DVT of right posterior tibial veins PE seen in initial CT angio done for evaluation of AAA.   DVT of right posterior tibial veins are noted as well. DVT/PE suspected to be due to impaired mobility for 2 weeks after a fall.  Patient was ambulatory prior to that. Initially started on IV heparin drip.  Switched to oral Xarelto on 5/3.  Left psoas hematoma 4/25, patient complained of severe left thigh pain. MRI lumbar spine 4/26 also showed left psoas muscle hematoma. Looks to be self-contained.  Unclear if it was the primary etiology of pain. No longer having any pain.  Hemodynamically stable.  Continue to monitor.     Acute on chronic anemia Unclear baseline hemoglobin as the most recent blood work prior to this hospitalization was from 2020 with hemoglobin more than 12.  However ferritin level is low suggestive of chronic slow bleeding. This hospitalization, hemoglobin has been running low. 1 unit of PRBC transfusion was given on 4/23.  Hemoglobin continues to remain low and continues to downtrend on Xarelto.  Wife mentions black tarry stool. I will hold Xarelto at this time. Will discuss with GI.  If on endoscopic evaluation, he is not found to have any source, he may need IVC filter. While awaiting for endoscopic evaluation, we may need to cover him again with heparin drip. Recent Labs    12/08/22 1118 02/16/23 1059 02/23/23 6295 02/23/23 2841 02/23/23 3244 02/23/23 1539 02/24/23 0102 02/28/23 7253 03/01/23 0343 03/02/23 0415 03/03/23 0208 03/04/23 0432  HGB  --    < >  --   --  8.4*  --    < > 7.9* 7.5* 7.1* 7.3* 7.1*  MCV  --    < >  --   --  77.4*  --    < > 80.4 80.9 80.1 80.1 83.3  VITAMINB12 320  --   --   --   --  678  --   --   --   --   --   --   FOLATE  --   --  13.2  --   --   --   --   --   --   --   --   --   FERRITIN  --   --   --   --  23*  --   --   --   --   --   --   --   TIBC  --   --   --   --  377  --   --   --   --   --   --   --    IRON  --   --   --   --  16*  --   --   --   --   --   --   --   RETICCTPCT  --   --   --  2.0  --   --   --   --   --   --   --   --    < > = values in this interval not displayed.     Essential hypertension Blood pressure stable with amlodipine, losartan   Prediabetes Hemoglobin A1c on 12/08/2022 was 5.8 Continue diet and lifestyle modification at this time   Mixed hyperlipidemia Continue Lipitor   GERD Continue Protonix   Dementia Continue Aricept, Namenda  Severe protein calorie malnutrition Declining oral intake for last year. Nutrition consult obtained. Started on supplements  Constipation Continue scheduled and as needed bowel regimen   Oral thrush Magic mouthwash  Impaired mobility Previously ambulatory but since since fall 2 weeks prior, he has been less ambulatory.  Has also suffered physical deconditioning.  PT eval obtained.  SNF recommended.  Goals of care   Code Status: DNR     DVT prophylaxis:  SCDs Start: 02/21/23 1945 Rivaroxaban (XARELTO) tablet 15 mg  rivaroxaban (XARELTO) tablet 20 mg   Antimicrobials: None Fluid: None currently Consultants: IR, vascular surgery Family Communication: Patient's wife at bedside. Status: Inpatient Level of care:  Med-Surg   Patient from: Home Anticipated d/c to: SNF Needs to continue in-hospital care:  Hemoglobin dropping, may need endoscopic evaluation.   Diet:  Diet Order             Diet regular Room service appropriate? Yes; Fluid consistency: Thin  Diet effective now                   Scheduled Meds:  (feeding supplement) PROSource Plus  30 mL Oral Daily   sodium chloride   Intravenous Once   amLODipine  5 mg Oral Daily   atorvastatin  40 mg Oral Daily   cyanocobalamin  2,000 mcg Oral Daily   diphenhydrAMINE  25 mg Oral Once   docusate sodium  100 mg Oral BID   donepezil  10 mg Oral QHS   feeding supplement  1 Container Oral Q24H   feeding supplement  237 mL Oral BID BM   ferrous  sulfate  325 mg Oral Q breakfast  insulin aspart  0-9 Units Subcutaneous TID WC   lidocaine  1 patch Transdermal Q24H   losartan  100 mg Oral Daily   magic mouthwash  5 mL Oral TID   megestrol  20 mg Oral BID   memantine  10 mg Oral BID   multivitamin with minerals  1 tablet Oral Daily   pantoprazole  40 mg Oral Daily   polyethylene glycol  17 g Oral Daily   Rivaroxaban  15 mg Oral BID WC   Followed by   Melene Muller ON 03/16/2023] rivaroxaban  20 mg Oral Q supper    PRN meds: acetaminophen **OR** acetaminophen, hydrALAZINE, iohexol, ondansetron **OR** ondansetron (ZOFRAN) IV, oxyCODONE   Infusions:     Antimicrobials: Anti-infectives (From admission, onward)    Start     Dose/Rate Route Frequency Ordered Stop   03/02/23 1050  ceFAZolin (ANCEF) IVPB 2g/100 mL premix        over 30 Minutes Intravenous Continuous PRN 03/02/23 1058 03/02/23 1050   03/02/23 0600  ceFAZolin (ANCEF) IVPB 2g/100 mL premix       Note to Pharmacy: Do not send to floor, will be given in Radiology dept prior to procedure.   2 g 200 mL/hr over 30 Minutes Intravenous On call 03/01/23 1039 03/03/23 0600       Nutritional status:  Body mass index is 21.37 kg/m.  Nutrition Problem: Severe Malnutrition Etiology: chronic illness Signs/Symptoms: severe fat depletion, moderate fat depletion, severe muscle depletion, energy intake < or equal to 75% for > or equal to 1 month     Objective: Vitals:   03/04/23 0433 03/04/23 0800  BP: (!) 127/57 133/68  Pulse: 80 (!) 107  Resp:  16  Temp: 98.3 F (36.8 C) 98.7 F (37.1 C)  SpO2: 96% 100%    Intake/Output Summary (Last 24 hours) at 03/04/2023 1004 Last data filed at 03/03/2023 2154 Gross per 24 hour  Intake 240 ml  Output 650 ml  Net -410 ml    Filed Weights   02/21/23 1159 02/28/23 0438  Weight: 64.9 kg 61.9 kg   Weight change:  Body mass index is 21.37 kg/m.   Physical Exam: General exam: Pleasant, elderly Caucasian male. Not in  distress Skin: No rashes, lesions or ulcers. HEENT: Atraumatic, normocephalic, no obvious bleeding Lungs: Clear to auscultation bilaterally CVS: Regular rate and rhythm, no murmur GI/Abd soft, nontender, nondistended, bowel sound present CNS: Alert, awake, oriented x 3. Psychiatry: Mood appropriate Extremities: No pedal edema, no calf tenderness  Data Review: I have personally reviewed the laboratory data and studies available.  F/u labs ordered Unresulted Labs (From admission, onward)     Start     Ordered   03/02/23 0500  CBC  Daily,   R     See Hyperspace for full Linked Orders Report.   02/28/23 1349   Unscheduled  Occult blood card to lab, stool  As needed,   R        03/04/23 0949            Total time spent in review of labs and imaging, patient evaluation, formulation of plan, documentation and communication with family: 45 minutes  Signed, Lorin Glass, MD [facility] 03/04/2023

## 2023-03-04 NOTE — TOC Progression Note (Signed)
Transition of Care [facility]) - Progression Note    Patient Details  Name: Daden Monjaraz MRN: [id number] Date of Birth: 02-13-1946  Transition of Care [facility]) CM/SW Contact  Carley Hammed, LCSW Phone Number: 03/04/2023, 11:37 AM  Clinical Narrative:     CSW was notified by MD that pt is currently not medically ready to DC. CSW updated facility to delay in DC. CSW spoke with wife to update, wife noted understanding. TOC will continue to follow for DC needs.   Expected Discharge Plan: Skilled Nursing Facility Barriers to Discharge: Continued Medical Work up, SNF Pending bed offer, English as a second language teacher  Expected Discharge Plan and Services     Post Acute Care Choice: Skilled Nursing Facility Living arrangements for the past 2 months: Single Family Home                                       Social Determinants of Health (SDOH) Interventions SDOH Screenings   Food Insecurity: Patient Declined (02/22/2023)  Housing: Low Risk  (02/22/2023)  Transportation Needs: Patient Declined (02/22/2023)  Utilities: Patient Declined (02/22/2023)  Alcohol Screen: Low Risk  (04/23/2022)  Depression (PHQ2-9): High Risk (02/16/2023)  Financial Resource Strain: Low Risk  (04/23/2022)  Physical Activity: Inactive (04/23/2022)  Social Connections: Socially Integrated (04/23/2022)  Stress: No Stress Concern Present (04/23/2022)  Tobacco Use: Medium Risk (03/04/2023)    Readmission Risk Interventions     No data to display

## 2023-03-05 DIAGNOSIS — I714 Abdominal aortic aneurysm, without rupture, unspecified: Secondary | ICD-10-CM | POA: Diagnosis not present

## 2023-03-05 LAB — GLUCOSE, CAPILLARY
Glucose-Capillary: 204 mg/dL — ABNORMAL HIGH (ref 70–99)
Glucose-Capillary: 100 mg/dL — ABNORMAL HIGH (ref 70–99)
Glucose-Capillary: 201 mg/dL — ABNORMAL HIGH (ref 70–99)
Glucose-Capillary: 185 mg/dL — ABNORMAL HIGH (ref 70–99)

## 2023-03-05 LAB — CBC
HCT: 22.7 % — ABNORMAL LOW (ref 39.0–52.0)
Hemoglobin: 6.9 g/dL — CL (ref 13.0–17.0)
MCH: 25 pg — ABNORMAL LOW (ref 26.0–34.0)
MCHC: 30.4 g/dL (ref 30.0–36.0)
MCV: 82.2 fL (ref 80.0–100.0)
Platelets: 277 10*3/uL (ref 150–400)
RBC: 2.76 MIL/uL — ABNORMAL LOW (ref 4.22–5.81)
RDW: 21.1 % — ABNORMAL HIGH (ref 11.5–15.5)
WBC: 7.1 10*3/uL (ref 4.0–10.5)
nRBC: 0 % (ref 0.0–0.2)

## 2023-03-05 LAB — BASIC METABOLIC PANEL
Anion gap: 7 (ref 5–15)
BUN: 30 mg/dL — ABNORMAL HIGH (ref 8–23)
CO2: 23 mmol/L (ref 22–32)
Calcium: 8.5 mg/dL — ABNORMAL LOW (ref 8.9–10.3)
Chloride: 106 mmol/L (ref 98–111)
Creatinine, Ser: 1.19 mg/dL (ref 0.61–1.24)
GFR, Estimated: >60 mL/min (ref >60)
Glucose, Bld: 120 mg/dL — ABNORMAL HIGH (ref 70–99)
Potassium: 4.4 mmol/L (ref 3.5–5.1)
Sodium: 136 mmol/L (ref 135–145)

## 2023-03-05 LAB — TYPE AND SCREEN

## 2023-03-05 LAB — APTT
aPTT: 53 seconds — ABNORMAL HIGH (ref 24–36)
aPTT: 60 seconds — ABNORMAL HIGH (ref 24–36)
aPTT: 56 seconds — ABNORMAL HIGH (ref 24–36)

## 2023-03-05 LAB — PREPARE RBC (CROSSMATCH)

## 2023-03-05 LAB — HEPARIN LEVEL (UNFRACTIONATED)
Heparin Unfractionated: >1.1 IU/mL — ABNORMAL HIGH (ref 0.30–0.70)
Heparin Unfractionated: 0.78 IU/mL — ABNORMAL HIGH (ref 0.30–0.70)

## 2023-03-05 LAB — BPAM RBC: ISSUE DATE / TIME: 202405040359

## 2023-03-05 MED ORDER — SODIUM CHLORIDE 0.9% IV SOLUTION: Freq: Once | INTRAVENOUS | Status: AC

## 2023-03-05 NOTE — Progress Notes (Addendum)
    Patient Name: Walter [name]           DOB: Oct 19, 1946  MRN: [id number]      Admission Date: 02/21/2023  Attending Provider: Lorin Glass, MD  Primary Diagnosis: Abdominal aortic aneurysm [facility])   Level of care: Med-Surg    CROSS COVER NOTE   Date of Service   03/05/2023   Sunday Corn, 77 y.o. male, was admitted on 02/21/2023 for Abdominal aortic aneurysm [facility]).    HPI/Events of Note   Hemoglobin 7.1 --> 6.9.  Acute on chronic anemia. No acute changes reported.  Hemodynamically stable.  No melena, hematochezia, or other bleeding reported tonight. Hemoglobin has been trending low this past few days.  Per previous documentation, wife endorsed black tarry stools and Xarelto was held.   Interventions/ Plan   Blood transfusion, 1 unit PRBC        [name], DNP, [employer] [facility] [place]

## 2023-03-05 NOTE — Progress Notes (Signed)
ANTICOAGULATION CONSULT NOTE  Pharmacy Xarelto transition to heparin gtt Indication:  mural thrombus  and pulmonary embolus  No Known Allergies  Patient Measurements: Height: 5\' 7"  (170.2 cm) Weight: 61.9 kg (136 lb 7.4 oz) IBW/kg (Calculated) : 66.1 kg   Vital Signs: Temp: 98.7 F (37.1 C) (05/04 0932) Temp Source: Oral (05/04 0932) BP: 141/68 (05/04 0932) Pulse Rate: 81 (05/04 0932)  Labs: Recent Labs    03/03/23 0208 03/04/23 0432 03/04/23 1417 03/05/23 0041 03/05/23 1121  HGB 7.3* 7.1*  --  6.9*  --   HCT 23.8* 24.5*  --  22.7*  --   PLT 284 282  --  277  --   APTT 78*  --  43* 53* 60*  HEPARINUNFRC 0.59  --  >1.10* >1.10* 0.78*  CREATININE  --   --   --  1.19  --      Estimated Creatinine Clearance: 45.5 mL/min (by C-G formula based on SCr of 1.19 mg/dL).   Medical History: Past Medical History:  Diagnosis Date   Allergy    Cancer [facility])    Skin cancer   Cataract    Coronary artery disease    Diabetes mellitus    Diverticulosis    Esophageal stricture    GERD (gastroesophageal reflux disease)    Hyperlipidemia    Hypertension    Peripheral vascular disease (HCC)    Medication History pending.  No anticoagulation PTA.  Assessment: 77 YO M with ongoing pain s/p fall ~ 2 weeks.  Had additional imaging 4/22 which showed aneurysm for which pt was sent to the ED for further imaging.  [place] and CT showed 6 cm infrarenal abdominal aortic aneurysm is noted with a large amount of mural thrombus. Also has acute PE. Pharmacy has been consulted to dose heparin.   Last dose Xarelto 5/3 @ 0834. Pharmacy consulted to transition patient from Xarelto to heparin gtt on 5/3  5/4 AM update:  aPTT low Hgb low but stable, NP is giving one unit of blood Hgb 6.9 and platelets WNL in AM, received 1 PRBC this morning.  5/4 PM Update: aPTT subtherapeutic at 60 and HL 0.78. Dosing off of aPTT and HL is starting to trend down and will likely correlate with aPTT soon. No  reported s/sx of bleeding.    Goal of Therapy:  Heparin level 0.3-0.5 units/mL aPTT 66-84 seconds  Monitor platelets by anticoagulation protocol: Yes  Plan:  Inc heparin to 1200 units/hr 2000 aPTT/heparin level Watch Hgb closely GI following  March Rummage, PharmD PGY2 Pharmacy Resident  Please check AMION for all [facility] pharmacy phone numbers After 10:00 PM call main pharmacy [phone]

## 2023-03-05 NOTE — Plan of Care (Signed)

## 2023-03-05 NOTE — Progress Notes (Signed)
PROGRESS NOTE  Sunday Corn  DOB: Jun 29, 1946  PCP: Kristian Covey, MD OZH:[id number]  DOA: 02/21/2023  LOS: 12 days  Hospital Day: 13  Brief narrative: Walter [name] is a 77 y.o. male with PMH significant for DM2, HTN, HLD, CAD, PVD, dementia, GERD, diverticulosis. 4/17, patient had a lumbar spine x-ray done for 2 weeks of lower back pain after a fall.  X-ray showed L1 compression fracture but also showed 5 cm distal AAA. 4/22, patient was sent to the ED at [facility] for evaluation of AAA. Ultrasound aorta showed 6 cm infrarenal AAA with large amount of mural thrombus. 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 Images that extended into the lower chest noted small subsegmental incidental pulmonary embolus in right lower lobe.   Vascular surgery was consulted Patient was started on IV heparin Patient was admitted to [facility] at [facility] Ultrasound duplex of lower extremities showed acute DVT of right posterior tibial veins.   Subjective: Patient was seen and examined this morning.   Pleasant elderly Caucasian male.  Lying on bed.  Not in distress.  No new symptoms.  Had a bowel movement this morning but was not sure if it is dark color or black color.  Wife at bedside. Hemoglobin was low at 6.9 this morning and received 1 unit of PRBC transfusion. Tentatively plan for EGD/coloscopy on Monday 5/6.  Assessment and plan: Acute on chronic anemia Unclear baseline hemoglobin as the most recent blood work prior to this hospitalization was from 2020 with hemoglobin more than 12.  However ferritin level is low suggestive of chronic slow bleeding. This hospitalization, hemoglobin has been running low. 1 unit of PRBC transfusion was given on 4/23.  Hemoglobin dropped down to 6.9 this morning.  1 more unit of PRBC transfusion given. Currently remains on heparin drip for coexisting DVT/PE. GI  plans to do EGD/colonoscopy on Monday 5/6. If on endoscopic evaluation, he is not found to have any source, he may need IVC filter. Recent Labs    12/08/22 1118 02/16/23 1059 02/23/23 0625 02/23/23 0626 02/23/23 6295 02/23/23 1539 02/24/23 0709 03/01/23 0343 03/02/23 0415 03/03/23 0208 03/04/23 0432 03/05/23 0041  HGB  --    < >  --   --  8.4*  --    < > 7.5* 7.1* 7.3* 7.1* 6.9*  MCV  --    < >  --   --  77.4*  --    < > 80.9 80.1 80.1 83.3 82.2  VITAMINB12 320  --   --   --   --  678  --   --   --   --   --   --   FOLATE  --   --  13.2  --   --   --   --   --   --   --   --   --   FERRITIN  --   --   --   --  23*  --   --   --   --   --   --   --   TIBC  --   --   --   --  377  --   --   --   --   --   --   --   IRON  --   --   --   --  16*  --   --   --   --   --   --   --  RETICCTPCT  --   --   --  2.0  --   --   --   --   --   --   --   --    < > = values in this interval not displayed.     Large infrarenal AAA with mural thrombus Vascular surgery evaluated the patient and advised no need for immediate intervention for AAA until PE has been treated.   To follow-up in office in 6 months for repeat ultrasound.   Acute to subacute L1 compression fracture Likely secondary to fall 2 weeks prior to presentation MRI 4/26 showed acute to subacute L1 compression fracture with up to 70% vertebral body height loss and 3 mm retropulsion of the posterior cortex.  No spinal canal stenosis or neural foraminal narrowing. 5/1, underwent L1 balloon kyphoplasty by IR.  Biopsy sent as well.  Pending report. Currently on pain management with Oxy IR    Subsegmental pulmonary embolism Acute DVT of right posterior tibial veins PE seen in initial CT angio done for evaluation of AAA.   DVT of right posterior tibial veins are noted as well. DVT/PE suspected to be due to impaired mobility for 2 weeks after a fall.  Patient was ambulatory prior to that. On heparin drip.  Left psoas  hematoma 4/25, patient complained of severe left thigh pain. MRI lumbar spine 4/26 also showed left psoas muscle hematoma. Looks to be self-contained.  Unclear if it was the primary etiology of pain. No longer having any pain.  Hemodynamically stable.  Continue to monitor.     Essential hypertension Blood pressure stable with amlodipine, losartan   Prediabetes Hemoglobin A1c on 12/08/2022 was 5.8 Continue diet and lifestyle modification at this time   Mixed hyperlipidemia Continue Lipitor   GERD Continue Protonix   Dementia Continue Aricept, Namenda  Severe protein calorie malnutrition Declining oral intake for last year. Nutrition consult obtained. Started on supplements  Constipation Continue scheduled and as needed bowel regimen   Oral thrush Magic mouthwash  Impaired mobility Previously ambulatory but since since fall 2 weeks prior, he has been less ambulatory.  Has also suffered physical deconditioning.  PT eval obtained.  SNF recommended.  Goals of care   Code Status: DNR     DVT prophylaxis:  SCDs Start: 02/21/23 1945   Antimicrobials: None Fluid: None currently Consultants: IR, vascular surgery Family Communication: Patient's wife at bedside. Status: Inpatient Level of care:  Med-Surg   Patient from: Home Anticipated d/c to: SNF Needs to continue in-hospital care:  Pending EGD/colonoscopy Monday 5/6.   Diet:  Diet Order             Diet regular Room service appropriate? Yes; Fluid consistency: Thin  Diet effective now                   Scheduled Meds:  sodium chloride   Intravenous Once   amLODipine  5 mg Oral Daily   atorvastatin  40 mg Oral Daily   cyanocobalamin  2,000 mcg Oral Daily   diphenhydrAMINE  25 mg Oral Once   docusate sodium  100 mg Oral BID   donepezil  10 mg Oral QHS   feeding supplement  237 mL Oral BID BM   ferrous sulfate  325 mg Oral Q breakfast   insulin aspart  0-9 Units Subcutaneous TID WC   lidocaine  1  patch Transdermal Q24H   losartan  100 mg Oral Daily   magic mouthwash  5  mL Oral TID   megestrol  20 mg Oral BID   memantine  10 mg Oral BID   multivitamin with minerals  1 tablet Oral Daily   pantoprazole  40 mg Oral Daily   polyethylene glycol  17 g Oral Daily    PRN meds: acetaminophen **OR** acetaminophen, hydrALAZINE, iohexol, ondansetron **OR** ondansetron (ZOFRAN) IV, oxyCODONE   Infusions:   heparin 1,100 Units/hr (03/05/23 0617)     Antimicrobials: Anti-infectives (From admission, onward)    Start     Dose/Rate Route Frequency Ordered Stop   03/02/23 1050  ceFAZolin (ANCEF) IVPB 2g/100 mL premix        over 30 Minutes Intravenous Continuous PRN 03/02/23 1058 03/02/23 1050   03/02/23 0600  ceFAZolin (ANCEF) IVPB 2g/100 mL premix       Note to Pharmacy: Do not send to floor, will be given in Radiology dept prior to procedure.   2 g 200 mL/hr over 30 Minutes Intravenous On call 03/01/23 1039 03/03/23 0600       Nutritional status:  Body mass index is 21.37 kg/m.  Nutrition Problem: Severe Malnutrition Etiology: chronic illness Signs/Symptoms: severe fat depletion, moderate fat depletion, severe muscle depletion, energy intake < or equal to 75% for > or equal to 1 month     Objective: Vitals:   03/05/23 0716 03/05/23 0932  BP: 137/69 (!) 141/68  Pulse: 75 81  Resp: 19 20  Temp: 98.9 F (37.2 C) 98.7 F (37.1 C)  SpO2: 100% 98%    Intake/Output Summary (Last 24 hours) at 03/05/2023 1018 Last data filed at 03/05/2023 0716 Gross per 24 hour  Intake 539.42 ml  Output 650 ml  Net -110.58 ml    Filed Weights   02/21/23 1159 02/28/23 0438  Weight: 64.9 kg 61.9 kg   Weight change:  Body mass index is 21.37 kg/m.   Physical Exam: General exam: Pleasant, elderly Caucasian male. Not in distress Skin: No rashes, lesions or ulcers. HEENT: Atraumatic, normocephalic, no obvious bleeding Lungs: Clear to auscultation bilaterally CVS: Regular rate and  rhythm, no murmur GI/Abd soft, nontender, nondistended, bowel sound present CNS: Alert, awake, oriented x 3. Psychiatry: Mood appropriate. Extremities: No pedal edema, no calf tenderness  Data Review: I have personally reviewed the laboratory data and studies available.  F/u labs ordered Unresulted Labs (From admission, onward)     Start     Ordered   03/06/23 0500  APTT  Daily,   R      03/04/23 1405   03/06/23 0500  Heparin level (unfractionated)  Daily,   R      03/04/23 1405   03/05/23 1100  Heparin level (unfractionated)  Once-Timed,   TIMED        03/05/23 0313   03/05/23 1100  APTT  Once-Timed,   TIMED        03/05/23 0313   03/05/23 0500  CBC  Daily,   R      03/04/23 1355   03/02/23 0500  CBC  Daily,   R     See Hyperspace for full Linked Orders Report.   02/28/23 1349   Pending  CBC  Daily,   R     Comments: Call MD for hemoglobin less than 7   See Hyperspace for full Linked Orders Report.   Pending            Total time spent in review of labs and imaging, patient evaluation, formulation of plan, documentation and communication with family:  45 minutes  Signed, Lorin Glass, MD [facility] 03/05/2023

## 2023-03-05 NOTE — Progress Notes (Signed)
ANTICOAGULATION CONSULT NOTE  Pharmacy Xarelto transition to heparin gtt Indication:  mural thrombus  and pulmonary embolus  No Known Allergies  Patient Measurements: Height: 5\' 7"  (170.2 cm) Weight: 61.9 kg (136 lb 7.4 oz) IBW/kg (Calculated) : 66.1 kg   Vital Signs: Temp: 97.5 F (36.4 C) (05/03 2016) Temp Source: Oral (05/03 2016) BP: 113/62 (05/03 2016) Pulse Rate: 80 (05/03 2016)  Labs: Recent Labs    03/02/23 0415 03/03/23 0208 03/04/23 0432 03/04/23 1417 03/05/23 0041  HGB 7.1* 7.3* 7.1*  --  6.9*  HCT 23.4* 23.8* 24.5*  --  22.7*  PLT 286 284 282  --  277  APTT 68* 78*  --  43* 53*  LABPROT 14.8  --   --   --   --   INR 1.2  --   --   --   --   HEPARINUNFRC  --  0.59  --  >1.10* >1.10*  CREATININE  --   --   --   --  1.19     Estimated Creatinine Clearance: 45.5 mL/min (by C-G formula based on SCr of 1.19 mg/dL).   Medical History: Past Medical History:  Diagnosis Date   Allergy    Cancer [facility])    Skin cancer   Cataract    Coronary artery disease    Diabetes mellitus    Diverticulosis    Esophageal stricture    GERD (gastroesophageal reflux disease)    Hyperlipidemia    Hypertension    Peripheral vascular disease (HCC)    Medication History pending.  No anticoagulation PTA.  Assessment: 77 YO M with ongoing pain s/p fall ~ 2 weeks.  Had additional imaging 4/22 which showed aneurysm for which pt was sent to the ED for further imaging.  [place] and CT showed 6 cm infrarenal abdominal aortic aneurysm is noted with a large amount of mural thrombus. Also has acute PE. Pharmacy has been consulted to dose heparin.   Last dose Xarelto 5/3 @ 0834. Pharmacy consulted to transition patient from Xarelto to heparin gtt on 5/3  5/4 AM update:  aPTT low Hgb low but stable, NP is giving one unit of blood  Goal of Therapy:  Heparin level 0.3-0.5 units/mL aPTT 66-84 seconds  Monitor platelets by anticoagulation protocol: Yes  Plan:  Inc heparin to 1100  units/hr 1100 aPTT/heparin level Watch Hgb closely GI following  Abran Duke, PharmD, BCPS Clinical Pharmacist Phone: [phone]

## 2023-03-05 NOTE — Progress Notes (Signed)
ANTICOAGULATION CONSULT NOTE  Pharmacy Xarelto transition to heparin infusion Indication:  mural thrombus and acute PE/DVT  No Known Allergies  Patient Measurements: Height: 5\' 7"  (170.2 cm) Weight: 61.9 kg (136 lb 7.4 oz) IBW/kg (Calculated) : 66.1 kg   Vital Signs: Temp: 97.8 F (36.6 C) (05/04 2023) Temp Source: Oral (05/04 2023) BP: 139/70 (05/04 2023) Pulse Rate: 98 (05/04 2023)  Labs: Recent Labs    03/03/23 0208 03/04/23 0432 03/04/23 1417 03/05/23 0041 03/05/23 1121 03/05/23 2041  HGB 7.3* 7.1*  --  6.9*  --   --   HCT 23.8* 24.5*  --  22.7*  --   --   PLT 284 282  --  277  --   --   APTT 78*  --  43* 53* 60* 56*  HEPARINUNFRC 0.59  --  >1.10* >1.10* 0.78*  --   CREATININE  --   --   --  1.19  --   --      Estimated Creatinine Clearance: 45.5 mL/min (by C-G formula based on SCr of 1.19 mg/dL).   Medical History: Past Medical History:  Diagnosis Date   Allergy    Cancer (HCC)    Skin cancer   Cataract    Coronary artery disease    Diabetes mellitus    Diverticulosis    Esophageal stricture    GERD (gastroesophageal reflux disease)    Hyperlipidemia    Hypertension    Peripheral vascular disease (HCC)     Assessment: 77 YO M with ongoing pain s/p fall ~ 2 weeks. Had additional imaging 4/22, which showed aneurysm for which pt was sent to the ED for further imaging. [place] and CT showed 6cm infrarenal abdominal aortic aneurysm noted with large amount of mural thrombus. Also has acute PE. Pharmacy consulted to dose heparin. Transitioned to Xarelto inpatient 5/2 - Last dose 5/3 @ 0834. Noted to have melenic stool and slight decrease in Hg. Pharmacy consulted to transition patient from Xarelto back to heparin infusion on 5/3.  PM update - aPTT subtherapeutic, decreased to 56 this evening. Hg was down to 6.9 this AM - received 1u PRBC this morning and awaiting repeat labs. Plt WNL. No bleeding or issues with infusion per discussion with RN.   Goal of  Therapy:  Heparin level 0.3-0.5 units/mL aPTT 66-85 seconds  Monitor platelets by anticoagulation protocol: Yes  Plan:  Increase heparin infusion to 1300 units/hr - conservative increase with possible bleed Check 6hr heparin level Monitor daily CBC, s/sx bleeding - watch Hg closely GI following - plan EGD/colonoscopy on 5/6 (hold heparin 4 hrs prior to procedure per GI note)   Leia Alf, PharmD, BCPS Please check AMION for all [facility] contact numbers Clinical Pharmacist 03/05/2023 9:30 PM

## 2023-03-06 DIAGNOSIS — I714 Abdominal aortic aneurysm, without rupture, unspecified: Secondary | ICD-10-CM | POA: Diagnosis not present

## 2023-03-06 DIAGNOSIS — I739 Peripheral vascular disease, unspecified: Secondary | ICD-10-CM

## 2023-03-06 DIAGNOSIS — K921 Melena: Secondary | ICD-10-CM | POA: Diagnosis not present

## 2023-03-06 DIAGNOSIS — D509 Iron deficiency anemia, unspecified: Secondary | ICD-10-CM | POA: Diagnosis not present

## 2023-03-06 DIAGNOSIS — Z794 Long term (current) use of insulin: Secondary | ICD-10-CM

## 2023-03-06 DIAGNOSIS — E119 Type 2 diabetes mellitus without complications: Secondary | ICD-10-CM

## 2023-03-06 DIAGNOSIS — R634 Abnormal weight loss: Secondary | ICD-10-CM | POA: Diagnosis not present

## 2023-03-06 DIAGNOSIS — R6881 Early satiety: Secondary | ICD-10-CM | POA: Diagnosis not present

## 2023-03-06 LAB — BPAM RBC
Blood Product Expiration Date: 202406052359
Unit Type and Rh: 5100

## 2023-03-06 LAB — GLUCOSE, CAPILLARY
Glucose-Capillary: 118 mg/dL — ABNORMAL HIGH (ref 70–99)
Glucose-Capillary: 182 mg/dL — ABNORMAL HIGH (ref 70–99)
Glucose-Capillary: 107 mg/dL — ABNORMAL HIGH (ref 70–99)

## 2023-03-06 LAB — CBC
HCT: 27.5 % — ABNORMAL LOW (ref 39.0–52.0)
Hemoglobin: 8.6 g/dL — ABNORMAL LOW (ref 13.0–17.0)
MCH: 26.1 pg (ref 26.0–34.0)
MCHC: 31.3 g/dL (ref 30.0–36.0)
MCV: 83.6 fL (ref 80.0–100.0)
Platelets: 269 10*3/uL (ref 150–400)
RBC: 3.29 MIL/uL — ABNORMAL LOW (ref 4.22–5.81)
RDW: 19.9 % — ABNORMAL HIGH (ref 11.5–15.5)
WBC: 6.5 10*3/uL (ref 4.0–10.5)
nRBC: 0 % (ref 0.0–0.2)

## 2023-03-06 LAB — TYPE AND SCREEN
ABO/RH(D): O POS
Antibody Screen: NEGATIVE
Unit division: 0

## 2023-03-06 LAB — APTT
aPTT: 86 seconds — ABNORMAL HIGH (ref 24–36)
aPTT: 84 seconds — ABNORMAL HIGH (ref 24–36)

## 2023-03-06 LAB — HEPARIN LEVEL (UNFRACTIONATED): Heparin Unfractionated: 0.99 IU/mL — ABNORMAL HIGH (ref 0.30–0.70)

## 2023-03-06 MED ORDER — HEPARIN (PORCINE) 25000 UT/250ML-% IV SOLN: 1250.0000 [IU]/h | INTRAVENOUS | Status: DC

## 2023-03-06 MED ORDER — HEPARIN (PORCINE) 25000 UT/250ML-% IV SOLN
1200.0000 [IU]/h | INTRAVENOUS | Status: AC
  Administered 2023-03-06: 1250 [IU]/h via INTRAVENOUS
  Filled 2023-03-06: qty 250

## 2023-03-06 MED ORDER — PEG-KCL-NACL-NASULF-NA ASC-C 100 G PO SOLR
0.5000 | Freq: Once | ORAL | Status: AC
  Administered 2023-03-06: 100 g via ORAL
  Filled 2023-03-06: qty 1

## 2023-03-06 MED ORDER — PEG-KCL-NACL-NASULF-NA ASC-C 100 G PO SOLR: 1.0000 | Freq: Once | ORAL | Status: DC

## 2023-03-06 NOTE — Progress Notes (Signed)
ANTICOAGULATION CONSULT NOTE  Pharmacy Xarelto transition to heparin infusion Indication:  mural thrombus and acute PE/DVT  No Known Allergies  Patient Measurements: Height: 5\' 7"  (170.2 cm) Weight: 61.9 kg (136 lb 7.4 oz) IBW/kg (Calculated) : 66.1 kg   Vital Signs: Temp: 98.6 F (37 C) (05/05 0427) Temp Source: Oral (05/04 2023) BP: 151/60 (05/05 0427) Pulse Rate: 66 (05/05 0427)  Labs: Recent Labs    03/04/23 0432 03/04/23 1417 03/05/23 0041 03/05/23 1121 03/05/23 2041 03/06/23 0703  HGB 7.1*  --  6.9*  --   --  8.6*  HCT 24.5*  --  22.7*  --   --  27.5*  PLT 282  --  277  --   --  269  APTT  --    < > 53* 60* 56*  --   HEPARINUNFRC  --    < > >1.10* 0.78*  --  0.99*  CREATININE  --   --  1.19  --   --   --    < > = values in this interval not displayed.     Estimated Creatinine Clearance: 45.5 mL/min (by C-G formula based on SCr of 1.19 mg/dL).   Medical History: Past Medical History:  Diagnosis Date   Allergy    Cancer (HCC)    Skin cancer   Cataract    Coronary artery disease    Diabetes mellitus    Diverticulosis    Esophageal stricture    GERD (gastroesophageal reflux disease)    Hyperlipidemia    Hypertension    Peripheral vascular disease (HCC)     Assessment: 77 YO M with ongoing pain s/p fall ~ 2 weeks. Had additional imaging 4/22, which showed aneurysm for which pt was sent to the ED for further imaging. [place] and CT showed 6cm infrarenal abdominal aortic aneurysm noted with large amount of mural thrombus. Also has acute PE. Pharmacy consulted to dose heparin. Transitioned to Xarelto inpatient 5/2 - Last dose 5/3 @ 0834. Noted to have melenic stool and slight decrease in Hg. Pharmacy consulted to transition patient from Xarelto back to heparin infusion on 5/3.  aPTT is 86 today and barely supratherapeutic, heparin level is not yet correlating at 0.99. Will dose based of aPTT and follow up heparin lab tomorrow. Platelets are WNL and Hgb has  improved to 8.6 following 1 PRBC given 5/4 AM for Hgb of 6.9. No s/sx of bleeding or issues with the infusion noted.   Goal of Therapy:  Heparin level 0.3-0.5 units/mL aPTT 66-85 seconds  Monitor platelets by anticoagulation protocol: Yes  Plan:  Decrease heparin infusion to 1250 units/hr Check 6hr aPTT Monitor daily CBC, HL, aPTT, and s/sx bleeding - watch Hg closely GI following - plan EGD/colonoscopy on 5/6 (hold heparin 4 hrs prior to procedure per GI note)   March Rummage, PharmD PGY2 Pharmacy Resident 03/06/2023 8:03 AM  Please check AMION for all [facility] pharmacy phone numbers After 10:00 PM call main pharmacy [phone]

## 2023-03-06 NOTE — Progress Notes (Signed)
ANTICOAGULATION CONSULT NOTE  Pharmacy Xarelto transition to heparin infusion Indication:  mural thrombus and acute PE/DVT  No Known Allergies  Patient Measurements: Height: 5\' 7"  (170.2 cm) Weight: 61.9 kg (136 lb 7.4 oz) IBW/kg (Calculated) : 66.1 kg   Vital Signs:    Labs: Recent Labs    03/04/23 0432 03/04/23 1417 03/05/23 0041 03/05/23 1121 03/05/23 2041 03/06/23 0703 03/06/23 1556  HGB 7.1*  --  6.9*  --   --  8.6*  --   HCT 24.5*  --  22.7*  --   --  27.5*  --   PLT 282  --  277  --   --  269  --   APTT  --    < > 53* 60* 56* 86* 84*  HEPARINUNFRC  --    < > >1.10* 0.78*  --  0.99*  --   CREATININE  --   --  1.19  --   --   --   --    < > = values in this interval not displayed.     Estimated Creatinine Clearance: 45.5 mL/min (by C-G formula based on SCr of 1.19 mg/dL).   Medical History: Past Medical History:  Diagnosis Date   Allergy    Cancer (HCC)    Skin cancer   Cataract    Coronary artery disease    Diabetes mellitus    Diverticulosis    Esophageal stricture    GERD (gastroesophageal reflux disease)    Hyperlipidemia    Hypertension    Peripheral vascular disease (HCC)     Assessment: 77 YO M with ongoing pain s/p fall ~ 2 weeks. Had additional imaging 4/22, which showed aneurysm for which pt was sent to the ED for further imaging. [place] and CT showed 6cm infrarenal abdominal aortic aneurysm noted with large amount of mural thrombus. Also has acute PE. Pharmacy consulted to dose heparin. Transitioned to Xarelto inpatient 5/2 - Last dose 5/3 @ 0834. Noted to have melenic stool and slight decrease in Hg. Pharmacy consulted to transition patient from Xarelto back to heparin infusion on 5/3.  S/p dose reduction to 1250 units/hr aPTT is 84 seconds, upper end therapeutic with low end goals as below, gtt to stop at 0200 prior to procedure  Goal of Therapy:  Heparin level 0.3-0.5 units/mL aPTT 66-85 seconds  Monitor platelets by anticoagulation  protocol: Yes  Plan:  Decrease heparin gtt to 1200 units/hr F/u restart post-op in AM  Daylene Posey, PharmD, [facility] Clinical Pharmacist ED Pharmacist Phone # [phone] 03/06/2023 6:16 PM

## 2023-03-06 NOTE — Progress Notes (Signed)
PROGRESS NOTE  Sunday Corn  DOB: [birth year]  PCP: Kristian Covey, MD NFA:[id number]  DOA: 02/21/2023  LOS: 13 days  Hospital Day: 14  Brief narrative: Walter [name] is a 77 y.o. male with PMH significant for DM2, HTN, HLD, CAD, PVD, dementia, GERD, diverticulosis. 4/17, patient had a lumbar spine x-ray done for 2 weeks of lower back pain after a fall.  X-ray showed L1 compression fracture but also showed 5 cm distal AAA. 4/22, patient was sent to the ED at [facility] for evaluation of AAA. Ultrasound aorta showed 6 cm infrarenal AAA with large amount of mural thrombus. 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 Images that extended into the lower chest noted small subsegmental incidental pulmonary embolus in right lower lobe.   Vascular surgery was consulted Patient was started on IV heparin Patient was admitted to [facility] at [facility] Ultrasound duplex of lower extremities showed acute DVT of right posterior tibial veins.   Subjective: Patient was seen and examined this morning.   Lying on bed.  Not in distress no new symptoms.  Wife at bedside.  Hemoglobin improved with 1 unit transfusion to 8.6 today. Pending EGD/colonoscopy tomorrow.  Assessment and plan: Acute on chronic anemia Unclear baseline hemoglobin as the most recent blood work prior to this hospitalization was from 2020 with hemoglobin more than 12.  However ferritin level is low suggestive of chronic slow bleeding. This hospitalization, hemoglobin has been running low. 1 unit of PRBC transfusion was given on 4/23.   Hemoglobin dropped down to 6.9 on 5/4.  Monitor PRBC transfusion was given with improvement in hemoglobin to 8.6.   Currently remains on heparin drip for coexisting DVT/PE. GI plans to do EGD/colonoscopy on Monday 5/6. If on endoscopic evaluation, he is not found to have any source, he may need IVC  filter. Recent Labs    12/08/22 1118 02/16/23 1059 02/23/23 0625 02/23/23 0626 02/23/23 4696 02/23/23 1539 02/24/23 0709 03/02/23 0415 03/03/23 0208 03/04/23 0432 03/05/23 0041 03/06/23 0703  HGB  --    < >  --   --  8.4*  --    < > 7.1* 7.3* 7.1* 6.9* 8.6*  MCV  --    < >  --   --  77.4*  --    < > 80.1 80.1 83.3 82.2 83.6  VITAMINB12 320  --   --   --   --  678  --   --   --   --   --   --   FOLATE  --   --  13.2  --   --   --   --   --   --   --   --   --   FERRITIN  --   --   --   --  23*  --   --   --   --   --   --   --   TIBC  --   --   --   --  377  --   --   --   --   --   --   --   IRON  --   --   --   --  16*  --   --   --   --   --   --   --   RETICCTPCT  --   --   --  2.0  --   --   --   --   --   --   --   --    < > = values in this interval not displayed.     Large infrarenal AAA with mural thrombus Vascular surgery evaluated the patient and advised no need for immediate intervention for AAA until PE has been treated.   To follow-up in office in 6 months for repeat ultrasound.   Acute to subacute L1 compression fracture Likely secondary to fall 2 weeks prior to presentation MRI 4/26 showed acute to subacute L1 compression fracture with up to 70% vertebral body height loss and 3 mm retropulsion of the posterior cortex.  No spinal canal stenosis or neural foraminal narrowing. 5/1, underwent L1 balloon kyphoplasty by IR.  Biopsy sent as well.  Pending report. Currently on pain management with Oxy IR    Subsegmental pulmonary embolism Acute DVT of right posterior tibial veins PE seen in initial CT angio done for evaluation of AAA.   DVT of right posterior tibial veins are noted as well. DVT/PE suspected to be due to impaired mobility for 2 weeks after a fall.  Patient was ambulatory prior to that. On heparin drip.  Left psoas hematoma 4/25, patient complained of severe left thigh pain. MRI lumbar spine 4/26 also showed left psoas muscle hematoma. Looks to be  self-contained.  Unclear if it was the primary etiology of pain. No longer having any pain. Hemodynamically stable.  Continue to monitor.     Essential hypertension Blood pressure stable with amlodipine, losartan   Prediabetes Hemoglobin A1c on 12/08/2022 was 5.8 Continue diet and lifestyle modification at this time   Mixed hyperlipidemia Continue Lipitor   GERD Continue Protonix   Dementia Continue Aricept, Namenda  Severe protein calorie malnutrition Declining oral intake for last year. Nutrition consult obtained. Started on supplements  Constipation Continue scheduled and as needed bowel regimen   Oral thrush Magic mouthwash  Impaired mobility Previously ambulatory but since since fall 2 weeks prior, he has been less ambulatory.  Has also suffered physical deconditioning.  PT eval obtained.  SNF recommended.  Goals of care   Code Status: DNR     DVT prophylaxis:  SCDs Start: 02/21/23 1945   Antimicrobials: None Fluid: None currently Consultants: IR, vascular surgery Family Communication: Patient's wife at bedside. Status: Inpatient Level of care:  Med-Surg   Patient from: Home Anticipated d/c to: SNF Needs to continue in-hospital care:  Pending EGD/colonoscopy Monday 5/6.   Diet:  Diet Order             Diet clear liquid Room service appropriate? Yes; Fluid consistency: Thin  Diet effective 0500 tomorrow                   Scheduled Meds:  sodium chloride   Intravenous Once   amLODipine  5 mg Oral Daily   atorvastatin  40 mg Oral Daily   cyanocobalamin  2,000 mcg Oral Daily   diphenhydrAMINE  25 mg Oral Once   docusate sodium  100 mg Oral BID   donepezil  10 mg Oral QHS   feeding supplement  237 mL Oral BID BM   ferrous sulfate  325 mg Oral Q breakfast   insulin aspart  0-9 Units Subcutaneous TID WC   lidocaine  1 patch Transdermal Q24H   losartan  100 mg Oral Daily   magic mouthwash  5 mL Oral TID   megestrol  20 mg Oral  BID    memantine  10 mg Oral BID   multivitamin with minerals  1 tablet Oral Daily   pantoprazole  40 mg Oral Daily   polyethylene glycol  17 g Oral Daily    PRN meds: acetaminophen **OR** acetaminophen, hydrALAZINE, iohexol, ondansetron **OR** ondansetron (ZOFRAN) IV, oxyCODONE   Infusions:   heparin 1,250 Units/hr (03/06/23 1016)     Antimicrobials: Anti-infectives (From admission, onward)    Start     Dose/Rate Route Frequency Ordered Stop   03/02/23 1050  ceFAZolin (ANCEF) IVPB 2g/100 mL premix        over 30 Minutes Intravenous Continuous PRN 03/02/23 1058 03/02/23 1050   03/02/23 0600  ceFAZolin (ANCEF) IVPB 2g/100 mL premix       Note to Pharmacy: Do not send to floor, will be given in Radiology dept prior to procedure.   2 g 200 mL/hr over 30 Minutes Intravenous On call 03/01/23 1039 03/03/23 0600       Nutritional status:  Body mass index is 21.37 kg/m.  Nutrition Problem: Severe Malnutrition Etiology: chronic illness Signs/Symptoms: severe fat depletion, moderate fat depletion, severe muscle depletion, energy intake < or equal to 75% for > or equal to 1 month     Objective: Vitals:   03/05/23 2023 03/06/23 0427  BP: 139/70 (!) 151/60  Pulse: 98 66  Resp:  17  Temp: 97.8 F (36.6 C) 98.6 F (37 C)  SpO2: 100% 100%    Intake/Output Summary (Last 24 hours) at 03/06/2023 1023 Last data filed at 03/06/2023 1019 Gross per 24 hour  Intake --  Output 150 ml  Net -150 ml    Filed Weights   02/21/23 1159 02/28/23 0438  Weight: 64.9 kg 61.9 kg   Weight change:  Body mass index is 21.37 kg/m.   Physical Exam: General exam: Pleasant, elderly Caucasian male. Not in distress Skin: No rashes, lesions or ulcers. HEENT: Atraumatic, normocephalic, no obvious bleeding Lungs: Clear to auscultation bilaterally CVS: Regular rate and rhythm, no murmur GI/Abd soft, nontender, nondistended, bowel sound present CNS: Alert, awake, oriented x 3 Psychiatry: Mood  appropriate Extremities: No pedal edema, no calf tenderness  Data Review: I have personally reviewed the laboratory data and studies available.  F/u labs ordered Unresulted Labs (From admission, onward)     Start     Ordered   03/07/23 0500  Heparin level (unfractionated)  Daily,   R      03/05/23 2143   03/06/23 1600  APTT  Once-Timed,   TIMED        03/06/23 1010   03/06/23 0500  CBC  Daily,   R     Comments: Call MD for hemoglobin less than 7   See Hyperspace for full Linked Orders Report.   03/05/23 1400   03/06/23 0500  APTT  Daily,   R      03/04/23 1405   03/05/23 0500  CBC  Daily,   R      03/04/23 1355            Total time spent in review of labs and imaging, patient evaluation, formulation of plan, documentation and communication with family: 45 minutes  Signed, Lorin Glass, MD [facility] 03/06/2023

## 2023-03-06 NOTE — H&P (View-Only) (Signed)
Patient ID: Walter [name], male   DOB: December 21, 1945, 77 y.o.   MRN: [id number]     Attending physician's note   I have taken a history, reviewed the chart, and examined the patient. I performed a substantive portion of this encounter, including complete performance of at least one of the key components, in conjunction with the APP. I agree with the APP's note, impression, and recommendations with my edits.   Transfused 1 unit PRBCs yesterday with good serologic response.  H/H 8.6/27.5 today.  - Plan for EGD/colonoscopy tomorrow with Dr. Rhea Belton - Clears today, n.p.o. at midnight - Bowel prep this evening - Please give dose of IV iron if not already done - Hold heparin 6 hours prior to procedures (pharmacy orders placed)  Doristine Locks, DO, FACG [phone] office          Progress Note   Subjective   Day# 13 CC; acute/subacute L1 compression fracture, new diagnosis of abdominal aortic aneurysm, new PE, acute on chronic anemia iron deficient and reports of melena at home  IV heparin  Patient says he feels okay today, back pain is much improved, no complaints of abdominal discomfort, no nausea or vomiting.  Denies melena  Labs-hemoglobin down to 6.9 yesterday, transfused x 1 up to 8.6 today    Objective   Vital signs in last 24 hours: Temp:  [97.8 F (36.6 C)-98.6 F (37 C)] 98.6 F (37 C) (05/05 0427) Pulse Rate:  [66-98] 66 (05/05 0427) Resp:  [17] 17 (05/05 0427) BP: (129-151)/(60-70) 151/60 (05/05 0427) SpO2:  [100 %] 100 % (05/05 0427) Last BM Date : 03/05/23 General: Elderly white male in NAD Heart:  Regular rate and rhythm; no murmurs Lungs: Respirations even and unlabored, lungs CTA bilaterally Abdomen:  Soft, nontender and nondistended.,  Cannot palpate  aneurysm ,normal bowel sounds. Extremities:  Without edema. Neurologic:  Alert and oriented,  grossly normal neurologically. Psych:  Cooperative. Normal mood and affect.  Intake/Output from previous  day: 05/04 0701 - 05/05 0700 In: 122.9 [Blood:122.9] Out: -  Intake/Output this shift: Total I/O In: -  Out: 150 [Urine:150]  Lab Results: Recent Labs    03/04/23 0432 03/05/23 0041 03/06/23 0703  WBC 8.1 7.1 6.5  HGB 7.1* 6.9* 8.6*  HCT 24.5* 22.7* 27.5*  PLT 282 277 269   BMET Recent Labs    03/05/23 0041  NA 136  K 4.4  CL 106  CO2 23  GLUCOSE 120*  BUN 30*  CREATININE 1.19  CALCIUM 8.5*   LFT No results for input(s): "PROT", "ALBUMIN", "AST", "ALT", "ALKPHOS", "BILITOT", "BILIDIR", "IBILI" in the last 72 hours. PT/INR No results for input(s): "LABPROT", "INR" in the last 72 hours.      Assessment / Plan:    #34 77 year old white male with multiple comorbidities admitted on 02/21/2023 with a L1 vertebral fracture after a fall at home, found also to have a new DVT and PE he was started on IV heparin, converted to Xarelto on 03/03/2023-currently back on heparin in anticipation of procedures.  #2 anemia iron deficient, positive stool and reports of melena prior to admission Has also had early satiety and some decrease in appetite, gradual weight loss CT of the abdomen pelvis no evidence of malignancy, did show a 5.9 cm infrarenal aortic aneurysm with mural thrombus  Remote EGD for esophageal dilation 2013-last colonoscopy 2020 with pan diverticulitis, no polyps  #3 coronary artery disease status post remote stents #4.  Peripheral vascular disease #5.  Adult onset  diabetes mellitus #6.  Mild dementia/Parkinson's  Plan;Clear liquids today, n.p.o. after midnight Bowel prep this evening, patient says his wife plans to be here this evening to help him with the prep Patient is scheduled for colonoscopy and EGD with Dr. Rhea Belton tomorrow 03/07/2023.  Procedures were discussed in detail with the patient including indications risk and benefits and he is agreeable to proceed. Continue to trend hemoglobin and transfuse to keep hemoglobin close to 8  Will hold heparin for 6  hours prior to procedures Would give IV iron if not given earlier in admission  Further recommendations pending findings at endoscopic evaluation.   Principal Problem:   Abdominal aortic aneurysm (HCC) Active Problems:   Essential hypertension   GERD (gastroesophageal reflux disease)   Acute pulmonary embolism (HCC)   Closed compression fracture of L1 lumbar vertebra, initial encounter (HCC)   Recurrent falls   Hypoalbuminemia due to protein-calorie malnutrition (HCC)   Mixed hyperlipidemia   Prediabetes   Dementia without behavioral disturbance (HCC)   Normocytic anemia   Protein-calorie malnutrition, severe (HCC)   Iron deficiency anemia   Melena   Loss of weight     LOS: 13 days   Amy Esterwood PA-C 03/06/2023, 10:30 AM

## 2023-03-06 NOTE — Progress Notes (Addendum)
Patient ID: Walter [name], male   DOB: 02/06/1946, 76 y.o.   MRN: [id number]     Attending physician's note   I have taken a history, reviewed the chart, and examined the patient. I performed a substantive portion of this encounter, including complete performance of at least one of the key components, in conjunction with the APP. I agree with the APP's note, impression, and recommendations with my edits.   Transfused 1 unit PRBCs yesterday with good serologic response.  H/H 8.6/27.5 today.  - Plan for EGD/colonoscopy tomorrow with Dr. Pyrtle - Clears today, n.p.o. at midnight - Bowel prep this evening - Please give dose of IV iron if not already done - Hold heparin 6 hours prior to procedures (pharmacy orders placed)  Alexee Delsanto, DO, FACG [phone] office          Progress Note   Subjective   Day# 13 CC; acute/subacute L1 compression fracture, new diagnosis of abdominal aortic aneurysm, new PE, acute on chronic anemia iron deficient and reports of melena at home  IV heparin  Patient says he feels okay today, back pain is much improved, no complaints of abdominal discomfort, no nausea or vomiting.  Denies melena  Labs-hemoglobin down to 6.9 yesterday, transfused x 1 up to 8.6 today    Objective   Vital signs in last 24 hours: Temp:  [97.8 F (36.6 C)-98.6 F (37 C)] 98.6 F (37 C) (05/05 0427) Pulse Rate:  [66-98] 66 (05/05 0427) Resp:  [17] 17 (05/05 0427) BP: (129-151)/(60-70) 151/60 (05/05 0427) SpO2:  [100 %] 100 % (05/05 0427) Last BM Date : 03/05/23 General: Elderly white male in NAD Heart:  Regular rate and rhythm; no murmurs Lungs: Respirations even and unlabored, lungs CTA bilaterally Abdomen:  Soft, nontender and nondistended.,  Cannot palpate  aneurysm ,normal bowel sounds. Extremities:  Without edema. Neurologic:  Alert and oriented,  grossly normal neurologically. Psych:  Cooperative. Normal mood and affect.  Intake/Output from previous  day: 05/04 0701 - 05/05 0700 In: 122.9 [Blood:122.9] Out: -  Intake/Output this shift: Total I/O In: -  Out: 150 [Urine:150]  Lab Results: Recent Labs    03/04/23 0432 03/05/23 0041 03/06/23 0703  WBC 8.1 7.1 6.5  HGB 7.1* 6.9* 8.6*  HCT 24.5* 22.7* 27.5*  PLT 282 277 269   BMET Recent Labs    03/05/23 0041  NA 136  K 4.4  CL 106  CO2 23  GLUCOSE 120*  BUN 30*  CREATININE 1.19  CALCIUM 8.5*   LFT No results for input(s): "PROT", "ALBUMIN", "AST", "ALT", "ALKPHOS", "BILITOT", "BILIDIR", "IBILI" in the last 72 hours. PT/INR No results for input(s): "LABPROT", "INR" in the last 72 hours.      Assessment / Plan:    #1 69-year-old white male with multiple comorbidities admitted on 02/21/2023 with a L1 vertebral fracture after a fall at home, found also to have a new DVT and PE he was started on IV heparin, converted to Xarelto on 03/03/2023-currently back on heparin in anticipation of procedures.  #2 anemia iron deficient, positive stool and reports of melena prior to admission Has also had early satiety and some decrease in appetite, gradual weight loss CT of the abdomen pelvis no evidence of malignancy, did show a 5.9 cm infrarenal aortic aneurysm with mural thrombus  Remote EGD for esophageal dilation 2013-last colonoscopy 2020 with pan diverticulitis, no polyps  #3 coronary artery disease status post remote stents #4.  Peripheral vascular disease #5.  Adult onset   diabetes mellitus #6.  Mild dementia/Parkinson's  Plan;Clear liquids today, n.p.o. after midnight Bowel prep this evening, patient says his wife plans to be here this evening to help him with the prep Patient is scheduled for colonoscopy and EGD with Dr. Pyrtle tomorrow 03/07/2023.  Procedures were discussed in detail with the patient including indications risk and benefits and he is agreeable to proceed. Continue to trend hemoglobin and transfuse to keep hemoglobin close to 8  Will hold heparin for 6  hours prior to procedures Would give IV iron if not given earlier in admission  Further recommendations pending findings at endoscopic evaluation.   Principal Problem:   Abdominal aortic aneurysm (HCC) Active Problems:   Essential hypertension   GERD (gastroesophageal reflux disease)   Acute pulmonary embolism (HCC)   Closed compression fracture of L1 lumbar vertebra, initial encounter (HCC)   Recurrent falls   Hypoalbuminemia due to protein-calorie malnutrition (HCC)   Mixed hyperlipidemia   Prediabetes   Dementia without behavioral disturbance (HCC)   Normocytic anemia   Protein-calorie malnutrition, severe (HCC)   Iron deficiency anemia   Melena   Loss of weight     LOS: 13 days   Amy Esterwood PA-C 03/06/2023, 10:30 AM   

## 2023-03-07 ENCOUNTER — Encounter (HOSPITAL_COMMUNITY): Payer: Self-pay | Admitting: Internal Medicine

## 2023-03-07 ENCOUNTER — Encounter (HOSPITAL_COMMUNITY)
Admission: EM | Disposition: A | Discharge status: Skilled Nursing Facility | Payer: Self-pay | Source: Home / Self Care | Attending: Internal Medicine

## 2023-03-07 ENCOUNTER — Inpatient Hospital Stay (HOSPITAL_COMMUNITY): Payer: Medicare HMO | Admitting: Certified Registered"

## 2023-03-07 DIAGNOSIS — D122 Benign neoplasm of ascending colon: Secondary | ICD-10-CM | POA: Diagnosis not present

## 2023-03-07 DIAGNOSIS — I2699 Other pulmonary embolism without acute cor pulmonale: Secondary | ICD-10-CM | POA: Diagnosis not present

## 2023-03-07 DIAGNOSIS — K5520 Angiodysplasia of colon without hemorrhage: Secondary | ICD-10-CM

## 2023-03-07 DIAGNOSIS — K449 Diaphragmatic hernia without obstruction or gangrene: Secondary | ICD-10-CM

## 2023-03-07 DIAGNOSIS — K649 Unspecified hemorrhoids: Secondary | ICD-10-CM

## 2023-03-07 DIAGNOSIS — D120 Benign neoplasm of cecum: Secondary | ICD-10-CM | POA: Diagnosis not present

## 2023-03-07 DIAGNOSIS — K921 Melena: Secondary | ICD-10-CM

## 2023-03-07 DIAGNOSIS — K5730 Diverticulosis of large intestine without perforation or abscess without bleeding: Secondary | ICD-10-CM

## 2023-03-07 DIAGNOSIS — E119 Type 2 diabetes mellitus without complications: Secondary | ICD-10-CM

## 2023-03-07 DIAGNOSIS — K648 Other hemorrhoids: Secondary | ICD-10-CM

## 2023-03-07 DIAGNOSIS — K31819 Angiodysplasia of stomach and duodenum without bleeding: Secondary | ICD-10-CM

## 2023-03-07 DIAGNOSIS — K2970 Gastritis, unspecified, without bleeding: Secondary | ICD-10-CM

## 2023-03-07 DIAGNOSIS — K5750 Diverticulosis of both small and large intestine without perforation or abscess without bleeding: Secondary | ICD-10-CM

## 2023-03-07 DIAGNOSIS — D509 Iron deficiency anemia, unspecified: Secondary | ICD-10-CM | POA: Diagnosis not present

## 2023-03-07 DIAGNOSIS — K635 Polyp of colon: Secondary | ICD-10-CM

## 2023-03-07 HISTORY — PX: HOT HEMOSTASIS: SHX5433

## 2023-03-07 HISTORY — PX: ESOPHAGOGASTRODUODENOSCOPY (EGD) WITH PROPOFOL: SHX5813

## 2023-03-07 HISTORY — PX: BIOPSY: SHX5522

## 2023-03-07 HISTORY — PX: POLYPECTOMY: SHX5525

## 2023-03-07 HISTORY — PX: COLONOSCOPY WITH PROPOFOL: SHX5780

## 2023-03-07 LAB — CBC
HCT: 27.2 % — ABNORMAL LOW (ref 39.0–52.0)
Hemoglobin: 8.4 g/dL — ABNORMAL LOW (ref 13.0–17.0)
MCH: 25.8 pg — ABNORMAL LOW (ref 26.0–34.0)
MCHC: 30.9 g/dL (ref 30.0–36.0)
MCV: 83.4 fL (ref 80.0–100.0)
Platelets: 263 10*3/uL (ref 150–400)
RBC: 3.26 MIL/uL — ABNORMAL LOW (ref 4.22–5.81)
RDW: 20.2 % — ABNORMAL HIGH (ref 11.5–15.5)
WBC: 6.8 10*3/uL (ref 4.0–10.5)
nRBC: 0 % (ref 0.0–0.2)

## 2023-03-07 LAB — GLUCOSE, CAPILLARY
Glucose-Capillary: 94 mg/dL (ref 70–99)
Glucose-Capillary: 88 mg/dL (ref 70–99)
Glucose-Capillary: 98 mg/dL (ref 70–99)
Glucose-Capillary: 227 mg/dL — ABNORMAL HIGH (ref 70–99)
Glucose-Capillary: 91 mg/dL (ref 70–99)

## 2023-03-07 LAB — HEPARIN LEVEL (UNFRACTIONATED): Heparin Unfractionated: 0.5 IU/mL (ref 0.30–0.70)

## 2023-03-07 LAB — APTT: aPTT: 50 seconds — ABNORMAL HIGH (ref 24–36)

## 2023-03-07 SURGERY — COLONOSCOPY WITH PROPOFOL
Anesthesia: Monitor Anesthesia Care

## 2023-03-07 MED ORDER — ADULT MULTIVITAMIN W/MINERALS CH: 1.0000 | ORAL_TABLET | Freq: Every day | ORAL | Status: AC

## 2023-03-07 MED ORDER — SODIUM CHLORIDE 0.9 % IV SOLN
250.0000 mg | Freq: Once | INTRAVENOUS | Status: AC
  Administered 2023-03-07: 250 mg via INTRAVENOUS
  Filled 2023-03-07: qty 20

## 2023-03-07 MED ORDER — LIDOCAINE 4 % EX PTCH: 1.0000 | MEDICATED_PATCH | Freq: Every day | CUTANEOUS | Status: DC | PRN

## 2023-03-07 MED ORDER — ENSURE ENLIVE PO LIQD: 237.0000 mL | Freq: Two times a day (BID) | ORAL | 12 refills | Status: DC

## 2023-03-07 MED ORDER — DOCUSATE SODIUM 100 MG PO CAPS: 100.0000 mg | ORAL_CAPSULE | Freq: Two times a day (BID) | ORAL | 0 refills | Status: DC

## 2023-03-07 MED ORDER — HEPARIN (PORCINE) 25000 UT/250ML-% IV SOLN
1200.0000 [IU]/h | INTRAVENOUS | Status: DC
  Administered 2023-03-07: 1200 [IU]/h via INTRAVENOUS

## 2023-03-07 MED ORDER — APIXABAN 5 MG PO TABS
5.0000 mg | ORAL_TABLET | Freq: Two times a day (BID) | ORAL | Status: DC
  Administered 2023-03-07 – 2023-03-08 (×3): 5 mg via ORAL
  Filled 2023-03-07 (×3): qty 1

## 2023-03-07 MED ORDER — PANTOPRAZOLE SODIUM 40 MG PO TBEC: 40.0000 mg | DELAYED_RELEASE_TABLET | Freq: Every day | ORAL | Status: DC

## 2023-03-07 MED ORDER — LACTATED RINGERS IV SOLN: INTRAVENOUS | Status: DC

## 2023-03-07 MED ORDER — OXYCODONE HCL 5 MG PO TABS: 5.0000 mg | ORAL_TABLET | Freq: Four times a day (QID) | ORAL | 0 refills | Status: AC | PRN

## 2023-03-07 MED ORDER — PROPOFOL 500 MG/50ML IV EMUL
INTRAVENOUS | Status: DC | PRN
  Administered 2023-03-07: 100 ug/kg/min via INTRAVENOUS

## 2023-03-07 SURGICAL SUPPLY — 25 items

## 2023-03-07 NOTE — Transfer of Care (Signed)
Immediate Anesthesia Transfer of Care Note  Patient: Walter [name]  Procedure(s) Performed: COLONOSCOPY WITH PROPOFOL ESOPHAGOGASTRODUODENOSCOPY (EGD) WITH PROPOFOL HOT HEMOSTASIS (ARGON PLASMA COAGULATION/BICAP) BIOPSY POLYPECTOMY  Patient Location: Endoscopy Unit  Anesthesia Type:MAC  Level of Consciousness: drowsy  Airway & Oxygen Therapy: Patient Spontanous Breathing  Post-op Assessment: Report given to RN and Post -op Vital signs reviewed and stable  Post vital signs: Reviewed and stable  Last Vitals:  Vitals Value Taken Time  BP 143/65 03/07/23 0948  Temp    Pulse 66 03/07/23 0949  Resp 16 03/07/23 0949  SpO2 100 % 03/07/23 0949  Vitals shown include unvalidated device data.  Last Pain:  Vitals:   03/07/23 0813  TempSrc: Oral  PainSc: 0-No pain      Patients Stated Pain Goal: 0 (02/26/23 0818)  Complications: No notable events documented.

## 2023-03-07 NOTE — Progress Notes (Signed)
Patient Heparin stop and given apixaban per pharmacy.

## 2023-03-07 NOTE — Plan of Care (Signed)

## 2023-03-07 NOTE — Interval H&P Note (Signed)
History and Physical Interval Note: For EGD and colonoscopy to evaluate acute on chronic iron deficiency anemia and reported melena.  History of PE with recent Xarelto subsequently on hold. The nature of the procedure, as well as the risks, benefits, and alternatives were carefully and thoroughly reviewed with the patient. Ample time for discussion and questions allowed. The patient understood, was satisfied, and agreed to proceed.   03/07/2023 8:39 AM  Walter [name]  has presented today for surgery, with the diagnosis of Iron deficiency anemia.  The various methods of treatment have been discussed with the patient and family. After consideration of risks, benefits and other options for treatment, the patient has consented to  Procedure(s): COLONOSCOPY WITH PROPOFOL (N/A) ESOPHAGOGASTRODUODENOSCOPY (EGD) WITH PROPOFOL (N/A) as a surgical intervention.  The patient's history has been reviewed, patient examined, no change in status, stable for surgery.  I have reviewed the patient's chart and labs.  Questions were answered to the patient's satisfaction.     Carie Caddy Katharine Rochefort

## 2023-03-07 NOTE — Progress Notes (Signed)
ANTICOAGULATION CONSULT NOTE  Pharmacy Heparin infusion transition to Xarelto  Indication:  mural thrombus and acute PE/DVT  No Known Allergies  Patient Measurements: Height: 5\' 7"  (170.2 cm) Weight: 61.9 kg (136 lb 7.4 oz) IBW/kg (Calculated) : 66.1 kg   Vital Signs: Temp: 98 F (36.7 C) (05/06 1048) Temp Source: Oral (05/06 1048) BP: 142/55 (05/06 1048) Pulse Rate: 67 (05/06 1048)  Labs: Recent Labs    03/05/23 0041 03/05/23 1121 03/05/23 2041 03/06/23 0703 03/06/23 1556 03/07/23 0346  HGB 6.9*  --   --  8.6*  --  8.4*  HCT 22.7*  --   --  27.5*  --  27.2*  PLT 277  --   --  269  --  263  APTT 53* 60*   < > 86* 84* 50*  HEPARINUNFRC >1.10* 0.78*  --  0.99*  --  0.50  CREATININE 1.19  --   --   --   --   --    < > = values in this interval not displayed.     Estimated Creatinine Clearance: 45.5 mL/min (by C-G formula based on SCr of 1.19 mg/dL).   Medical History: Past Medical History:  Diagnosis Date   Allergy    Cancer (HCC)    Skin cancer   Cataract    Coronary artery disease    Diabetes mellitus    Diverticulosis    Esophageal stricture    GERD (gastroesophageal reflux disease)    Hyperlipidemia    Hypertension    Peripheral vascular disease (HCC)     Assessment: 77 YO M with ongoing pain s/p fall ~ 2 weeks. Had additional imaging 4/22, which showed aneurysm for which pt was sent to the ED for further imaging. [place] and CT showed 6cm infrarenal abdominal aortic aneurysm noted with large amount of mural thrombus. Also has acute PE. Pharmacy consulted to dose heparin. Transitioned to Xarelto inpatient 5/2 - Last dose 5/3 @ 0834. Noted to have melenic stool and slight decrease in Hg. Pharmacy consulted to transition patient from Xarelto back to heparin infusion on 5/3.  5/6 AM update: Infusion was held at 0200 in prep for a colonoscopy this morning. Patient is s/p procedure. Heparin infusion can be restarted immediately  5/6 PM update: Consult received  to transition to Eliquis  Goal of Therapy:  Monitor platelets by anticoagulation protocol: Yes  Plan:  Discontinue heparin gtt and start apixaban 5 mg po bid. No need for 7 day load phase as patient has been on [facility] for the past 11 days Monitor for signs of bleeding Pharmacy will sign off consult but continue to monitor making recommendations prn  Sonu Kruckenberg BS, PharmD, BCPS Clinical Pharmacist 03/07/2023 1:15 PM  Contact: [phone] after 3 PM  "Be curious, not judgmental..." -Debbora Dus

## 2023-03-07 NOTE — Anesthesia Preprocedure Evaluation (Signed)
Anesthesia Evaluation  Patient identified by MRN, date of birth, ID band Patient awake    Reviewed: Allergy & Precautions, H&P , NPO status , Patient's Chart, lab work & pertinent test results  Airway Mallampati: II   Neck ROM: full    Dental   Pulmonary former smoker   breath sounds clear to auscultation       Cardiovascular hypertension, + CAD, + Cardiac Stents and + Peripheral Vascular Disease   Rhythm:regular Rate:Normal     Neuro/Psych  PSYCHIATRIC DISORDERS     Dementia    GI/Hepatic ,GERD  ,,  Endo/Other  diabetes, Type 2    Renal/GU      Musculoskeletal   Abdominal   Peds  Hematology   Anesthesia Other Findings   Reproductive/Obstetrics                             Anesthesia Physical Anesthesia Plan  ASA: 3  Anesthesia Plan: MAC   Post-op Pain Management:    Induction: Intravenous  PONV Risk Score and Plan: 1 and Propofol infusion and Treatment may vary due to age or medical condition  Airway Management Planned: Nasal Cannula  Additional Equipment:   Intra-op Plan:   Post-operative Plan:   Informed Consent: I have reviewed the patients History and Physical, chart, labs and discussed the procedure including the risks, benefits and alternatives for the proposed anesthesia with the patient or authorized representative who has indicated his/her understanding and acceptance.     Dental advisory given  Plan Discussed with: CRNA, Anesthesiologist and Surgeon  Anesthesia Plan Comments:        Anesthesia Quick Evaluation

## 2023-03-07 NOTE — Op Note (Signed)
[facility] Patient Name: Walter [name] Procedure Date : 03/07/2023 MRN: [id number] Attending MD: Beverley Fiedler , MD, [id number] Date of Birth: [birth year] CSN: [id number] Age: 77 Admit Type: Inpatient Procedure:                Colonoscopy Indications:              Melena, Iron deficiency anemia Providers:                Carie Caddy. Rhea Belton, MD, Fransisca Connors, Beryle Beams,                            Technician Referring MD:             [facility] Medicines:                Monitored Anesthesia Care Complications:            No immediate complications. Estimated Blood Loss:     Estimated blood loss: none. Procedure:                Pre-Anesthesia Assessment:                           - Prior to the procedure, a History and Physical                            was performed, and patient medications and                            allergies were reviewed. The patient's tolerance of                            previous anesthesia was also reviewed. The risks                            and benefits of the procedure and the sedation                            options and risks were discussed with the patient.                            All questions were answered, and informed consent                            was obtained. Prior Anticoagulants: The patient has                            taken heparin, last dose was 1 day prior to                            procedure. ASA Grade Assessment: III - A patient                            with severe systemic disease. After reviewing the  risks and benefits, the patient was deemed in                            satisfactory condition to undergo the procedure.                           After obtaining informed consent, the colonoscope                            was passed under direct vision. Throughout the                            procedure, the patient's blood pressure, pulse, and                             oxygen saturations were monitored continuously. The                            [id number]-HQ190L ([id number]) Olympus colonoscope was                            introduced through the anus and advanced to the                            cecum, identified by appendiceal orifice and                            ileocecal valve. The colonoscopy was performed                            without difficulty. The patient tolerated the                            procedure well. The quality of the bowel                            preparation was good. The ileocecal valve,                            appendiceal orifice, and rectum were photographed. Scope In: 9:24:05 AM Scope Out: 9:43:13 AM Scope Withdrawal Time: 0 hours 14 minutes 23 seconds  Total Procedure Duration: 0 hours 19 minutes 8 seconds  Findings:      The digital rectal exam was normal.      A 4 mm polyp was found in the cecum. The polyp was sessile. The polyp       was removed with a cold snare. Resection and retrieval were complete.      A 5 mm polyp was found in the ascending colon. The polyp was sessile.       The polyp was removed with a cold snare. Resection and retrieval were       complete.      Many medium-mouthed and small-mouthed diverticula were found from       ascending colon to sigmoid colon.      Internal hemorrhoids were found during retroflexion. The hemorrhoids  were small.      The exam was otherwise without abnormality. Impression:               - One 4 mm polyp in the cecum, removed with a cold                            snare. Resected and retrieved.                           - One 5 mm polyp in the ascending colon, removed                            with a cold snare. Resected and retrieved.                           - Severe diverticulosis from ascending colon to                            sigmoid colon.                           - Small internal hemorrhoids.                           - The examination  was otherwise normal. Moderate Sedation:      N/A Recommendation:           - Patient has a contact number available for                            emergencies. The signs and symptoms of potential                            delayed complications were discussed with the                            patient. Return to normal activities tomorrow.                            Written discharge instructions were provided to the                            patient.                           - Resume previous diet.                           - Continue present medications.                           - Okay to resume anticoagulation in setting of                            recent PE. 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 Replace iron                            IV and monitor Hgb and response to IV iron over                            time particularly in setting of DOAC.                           - Await pathology results.                           - No recommendation at this time regarding repeat                            colonoscopy due to age.                           - GI will sign off, but call if questions. Procedure Code(s):        --- Professional ---                           [phone], Colonoscopy, flexible; with removal of                            tumor(s), polyp(s), or other lesion(s) by snare                            technique Diagnosis Code(s):        --- Professional ---                           D12.0, Benign neoplasm of cecum                           D12.2, Benign neoplasm of ascending colon                           K64.8, Other hemorrhoids                           K92.1, Melena (includes Hematochezia)                           D50.9, Iron deficiency anemia, unspecified                           K57.30, Diverticulosis of large intestine without                            perforation or abscess without  bleeding CPT copyright 2022 American Medical Association. All rights reserved. The codes documented in this report are preliminary and upon coder review may  be revised to meet current compliance requirements. Beverley Fiedler, MD 03/07/2023 9:56:34 AM This report has been signed electronically. Number of Addenda: 0

## 2023-03-07 NOTE — Procedures (Addendum)
I attempted to reach the patient's wife by phone after procedures I did not reach her but left a voicemail regarding findings and my recommendations

## 2023-03-07 NOTE — Progress Notes (Signed)
PROGRESS NOTE  Sunday Corn  DOB: [birth year]  PCP: Kristian Covey, MD ZOX:[id number]  DOA: 02/21/2023  LOS: 14 days  Hospital Day: 15  Brief narrative: Walter [name] is a 77 y.o. male with PMH significant for DM2, HTN, HLD, CAD, PVD, dementia, GERD, diverticulosis. 4/17, patient had a lumbar spine x-ray done for 2 weeks of lower back pain after a fall.  X-ray showed L1 compression fracture but also showed 5 cm distal AAA. 4/22, patient was sent to the ED at [facility] for evaluation of AAA. Ultrasound aorta showed 6 cm infrarenal AAA with large amount of mural thrombus. 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 Images that extended into the lower chest noted small subsegmental incidental pulmonary embolus in right lower lobe.   Vascular surgery was consulted Patient was started on IV heparin Patient was admitted to [facility] Ultrasound duplex of lower extremities showed acute DVT of right posterior tibial veins.   Subjective: Patient was seen and examined this morning.   Lying on bed.  Not in distress no new symptoms.  Wife at bedside.   Underwent EGD and colonoscopy earlier this morning.  Assessment and plan: Acute on chronic anemia Unclear baseline hemoglobin as the most recent blood work prior to this hospitalization was from 2020 with hemoglobin more than 12.  However ferritin level is low suggestive of chronic slow bleeding. This hospitalization, hemoglobin has been running low. 1 unit of PRBC transfusion was given on 4/23.   Hemoglobin dropped down to 6.9 on 5/4.  Monitor PRBC transfusion was given with improvement in hemoglobin to 8.6.   GI was consulted. 5/6, underwent EGD and colonoscopy.  EGD showed a single angiectasia in the duodenum which was treated with APC.  Colonoscopy showed a cecal polyp, and ascending colon polyp and severe diverticulosis from  ascending colon to sigmoid colon.  Okay to resume anticoagulation per GI.  Will switch to Xarelto. Given low ferritin level, suggestive of chronic bleeding and iron loss, I ordered 1 dose of IV iron today.  Continue oral iron supplement. Recent Labs    12/08/22 1118 02/16/23 1059 02/23/23 0625 02/23/23 0626 02/23/23 8119 02/23/23 1539 02/24/23 0709 03/03/23 0208 03/04/23 0432 03/05/23 0041 03/06/23 0703 03/07/23 0346  HGB  --    < >  --   --  8.4*  --    < > 7.3* 7.1* 6.9* 8.6* 8.4*  MCV  --    < >  --   --  77.4*  --    < > 80.1 83.3 82.2 83.6 83.4  VITAMINB12 320  --   --   --   --  678  --   --   --   --   --   --   FOLATE  --   --  13.2  --   --   --   --   --   --   --   --   --   FERRITIN  --   --   --   --  23*  --   --   --   --   --   --   --   TIBC  --   --   --   --  377  --   --   --   --   --   --   --  IRON  --   --   --   --  16*  --   --   --   --   --   --   --   RETICCTPCT  --   --   --  2.0  --   --   --   --   --   --   --   --    < > = values in this interval not displayed.     Large infrarenal AAA with mural thrombus Vascular surgery evaluated the patient and advised no need for immediate intervention for AAA until PE has been treated.   To follow-up in office in 6 months for repeat ultrasound.   Acute to subacute L1 compression fracture Likely secondary to fall 2 weeks prior to presentation MRI 4/26 showed acute to subacute L1 compression fracture with up to 70% vertebral body height loss and 3 mm retropulsion of the posterior cortex.  No spinal canal stenosis or neural foraminal narrowing. 5/1, underwent L1 balloon kyphoplasty by IR.  Biopsy negative for malignancy.. Currently on pain management with Oxy IR    Subsegmental pulmonary embolism Acute DVT of right posterior tibial veins PE seen in initial CT angio done for evaluation of AAA.   DVT of right posterior tibial veins are noted as well. DVT/PE suspected to be due to impaired mobility for 2  weeks after a fall.  Patient was ambulatory prior to that. On anticoagulation.  Left psoas hematoma 4/25, patient complained of severe left thigh pain. MRI lumbar spine 4/26 also showed left psoas muscle hematoma. Looks to be self-contained.  Unclear if it was the primary etiology of pain. No longer having any pain. Hemodynamically stable.  Continue to monitor.     Essential hypertension Blood pressure stable with amlodipine, losartan   Prediabetes Hemoglobin A1c on 12/08/2022 was 5.8 Continue diet and lifestyle modification at this time   Mixed hyperlipidemia Continue Lipitor   GERD Continue Protonix   Dementia Continue Aricept, Namenda  Severe protein calorie malnutrition Declining oral intake for last year. Nutrition consult obtained. Started on supplements  Constipation Continue scheduled and as needed bowel regimen   Oral thrush Magic mouthwash  Impaired mobility Previously ambulatory but since since fall 2 weeks prior, he has been less ambulatory.  Has also suffered physical deconditioning.  PT eval obtained.  SNF recommended.  Goals of care   Code Status: DNR     DVT prophylaxis:  SCDs Start: 02/21/23 1945   Antimicrobials: None Fluid: None currently Consultants: IR, vascular surgery Family Communication: Patient's wife at bedside. Status: Inpatient Level of care:  Med-Surg   Patient from: Home Anticipated d/c to: SNF Needs to continue in-hospital care:  Expect stability for discharge tomorrow 5/7 to SNF   Diet:  Diet Order             Diet heart healthy/carb modified Room service appropriate? Yes; Fluid consistency: Thin  Diet effective now                   Scheduled Meds:  sodium chloride   Intravenous Once   amLODipine  5 mg Oral Daily   atorvastatin  40 mg Oral Daily   cyanocobalamin  2,000 mcg Oral Daily   diphenhydrAMINE  25 mg Oral Once   docusate sodium  100 mg Oral BID   donepezil  10 mg Oral QHS   feeding supplement  237  mL Oral BID BM   ferrous sulfate  325 mg Oral Q breakfast   insulin aspart  0-9 Units Subcutaneous TID WC   lidocaine  1 patch Transdermal Q24H   losartan  100 mg Oral Daily   magic mouthwash  5 mL Oral TID   megestrol  20 mg Oral BID   memantine  10 mg Oral BID   multivitamin with minerals  1 tablet Oral Daily   pantoprazole  40 mg Oral Daily    PRN meds: acetaminophen **OR** acetaminophen, hydrALAZINE, iohexol, ondansetron **OR** ondansetron (ZOFRAN) IV, oxyCODONE   Infusions:   ferric gluconate (FERRLECIT) IVPB     heparin 1,200 Units/hr (03/07/23 1059)     Antimicrobials: Anti-infectives (From admission, onward)    Start     Dose/Rate Route Frequency Ordered Stop   03/02/23 1050  ceFAZolin (ANCEF) IVPB 2g/100 mL premix        over 30 Minutes Intravenous Continuous PRN 03/02/23 1058 03/02/23 1050   03/02/23 0600  ceFAZolin (ANCEF) IVPB 2g/100 mL premix       Note to Pharmacy: Do not send to floor, will be given in Radiology dept prior to procedure.   2 g 200 mL/hr over 30 Minutes Intravenous On call 03/01/23 1039 03/03/23 0600       Nutritional status:  Body mass index is 21.37 kg/m.  Nutrition Problem: Severe Malnutrition Etiology: chronic illness Signs/Symptoms: severe fat depletion, moderate fat depletion, severe muscle depletion, energy intake < or equal to 75% for > or equal to 1 month     Objective: Vitals:   03/07/23 1019 03/07/23 1048  BP: (!) 156/67 (!) 142/55  Pulse: 67 67  Resp: 16 18  Temp:  98 F (36.7 C)  SpO2: 100% 100%   No intake or output data in the 24 hours ending 03/07/23 1312  Filed Weights   02/21/23 1159 02/28/23 0438 03/07/23 0813  Weight: 64.9 kg 61.9 kg 61.9 kg   Weight change:  Body mass index is 21.37 kg/m.   Physical Exam: General exam: Pleasant, elderly Caucasian male. Not in distress Skin: No rashes, lesions or ulcers. HEENT: Atraumatic, normocephalic, no obvious bleeding Lungs: Clear to auscultation  bilaterally CVS: Regular rate and rhythm, no murmur GI/Abd soft, nontender, nondistended, bowel sound present CNS: Alert, awake, oriented x 3 Psychiatry: Mood appropriate Extremities: No pedal edema, no calf tenderness  Data Review: I have personally reviewed the laboratory data and studies available.  F/u labs ordered Unresulted Labs (From admission, onward)     Start     Ordered   03/07/23 1800  Heparin level (unfractionated)  Once,   R        03/07/23 1045   03/07/23 1800  APTT  Once,   R        03/07/23 1045   03/07/23 0500  Heparin level (unfractionated)  Daily,   R      03/05/23 2143   03/06/23 0500  CBC  Daily,   R     Comments: Call MD for hemoglobin less than 7   See Hyperspace for full Linked Orders Report.   03/05/23 1400   03/06/23 0500  APTT  Daily,   R      03/04/23 1405   03/05/23 0500  CBC  Daily,   R      03/04/23 1355            Total time spent in review of labs and imaging, patient evaluation, formulation of plan, documentation and communication with family: 45 minutes  Signed, Lorin Glass, MD  [facility] 03/07/2023

## 2023-03-07 NOTE — Op Note (Signed)
[facility] Patient Name: Walter [name] Procedure Date : 03/07/2023 MRN: [id number] Attending MD: Beverley Fiedler , MD, [id number] Date of Birth: Mar 21, 1946 CSN: [id number] Age: 77 Admit Type: Inpatient Procedure:                Upper GI endoscopy Indications:              Iron deficiency anemia, Melena Providers:                Carie Caddy. Rhea Belton, MD, Fransisca Connors, Beryle Beams,                            Technician Referring MD:             [facility] Medicines:                Monitored Anesthesia Care Complications:            No immediate complications. Estimated Blood Loss:     Estimated blood loss was minimal. Procedure:                Pre-Anesthesia Assessment:                           - Prior to the procedure, a History and Physical                            was performed, and patient medications and                            allergies were reviewed. The patient's tolerance of                            previous anesthesia was also reviewed. The risks                            and benefits of the procedure and the sedation                            options and risks were discussed with the patient.                            All questions were answered, and informed consent                            was obtained. Prior Anticoagulants: The patient has                            taken heparin gtt, on hold x 6 hours. ASA Grade                            Assessment: III - A patient with severe systemic                            disease. After reviewing the risks and benefits,  the patient was deemed in satisfactory condition to                            undergo the procedure.                           After obtaining informed consent, the endoscope was                            passed under direct vision. Throughout the                            procedure, the patient's blood pressure, pulse, and                             oxygen saturations were monitored continuously. The                            [id number]-[id number] ([id number]) Olympus endoscope was introduced                            through the mouth, and advanced to the second part                            of duodenum. The upper GI endoscopy was                            accomplished without difficulty. The patient                            tolerated the procedure well. Scope In: Scope Out: Findings:      The examined esophagus was normal. Z-line slightly irregular but not       meeting criteria for Barrett's.      A 3 cm hiatal hernia was present.      The entire examined stomach was normal. Biopsies were taken with a cold       forceps for histology and Helicobacter pylori testing.      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 Fulguration to ablate the lesion to       prevent bleeding by argon plasma at 0.5 liters/minute and 20 watts was       successful.      Two large non-bleeding diverticula were found in the second portion of       the duodenum. Impression:               - Normal esophagus.                           - 3 cm hiatal hernia.                           - Normal stomach. Biopsied to exclude H. Pylori.                           -  A single angioectasia in the duodenum. Treated                            with argon plasma coagulation (APC).                           - 2 non-bleeding duodenal diverticula. Moderate Sedation:      N/A Recommendation:           - Return patient to hospital ward for ongoing care.                           - Advance diet as tolerated.                           - Continue present medications. Daily PPI                            recommended.                           - Await pathology results.                           - See the other procedure note for documentation of                            additional recommendations. Procedure  Code(s):        --- Professional ---                           [phone], 59, Esophagogastroduodenoscopy, flexible,                            transoral; with control of bleeding, any method                           [id number], Esophagogastroduodenoscopy, flexible,                            transoral; with biopsy, single or multiple Diagnosis Code(s):        --- Professional ---                           K44.9, Diaphragmatic hernia without obstruction or                            gangrene                           K31.819, Angiodysplasia of stomach and duodenum                            without bleeding                           D50.9, Iron deficiency anemia, unspecified  K92.1, Melena (includes Hematochezia)                           K57.10, Diverticulosis of small intestine without                            perforation or abscess without bleeding CPT copyright 2022 American Medical Association. All rights reserved. The codes documented in this report are preliminary and upon coder review may  be revised to meet current compliance requirements. Beverley Fiedler, MD 03/07/2023 9:21:50 AM This report has been signed electronically. Number of Addenda: 0

## 2023-03-07 NOTE — Anesthesia Procedure Notes (Signed)
Procedure Name: MAC Date/Time: 03/07/2023 9:00 AM  Performed by: Macie Burows, CRNAPre-anesthesia Checklist: Patient identified, Emergency Drugs available, Suction available, Patient being monitored and Timeout performed Patient Re-evaluated:Patient Re-evaluated prior to induction Oxygen Delivery Method: Nasal cannula Induction Type: IV induction Airway Equipment and Method: Bite block Placement Confirmation: positive ETCO2 and breath sounds checked- equal and bilateral Dental Injury: Teeth and Oropharynx as per pre-operative assessment

## 2023-03-07 NOTE — Progress Notes (Signed)
ANTICOAGULATION CONSULT NOTE  Pharmacy Xarelto transition to heparin infusion Indication:  mural thrombus and acute PE/DVT  No Known Allergies  Patient Measurements: Height: 5\' 7"  (170.2 cm) Weight: 61.9 kg (136 lb 7.4 oz) IBW/kg (Calculated) : 66.1 kg   Vital Signs: Temp: 97.7 F (36.5 C) (05/06 0949) Temp Source: Temporal (05/06 0949) BP: 156/67 (05/06 1019) Pulse Rate: 67 (05/06 1019)  Labs: Recent Labs    03/05/23 0041 03/05/23 1121 03/05/23 2041 03/06/23 0703 03/06/23 1556 03/07/23 0346  HGB 6.9*  --   --  8.6*  --  8.4*  HCT 22.7*  --   --  27.5*  --  27.2*  PLT 277  --   --  269  --  263  APTT 53* 60*   < > 86* 84* 50*  HEPARINUNFRC >1.10* 0.78*  --  0.99*  --  0.50  CREATININE 1.19  --   --   --   --   --    < > = values in this interval not displayed.     Estimated Creatinine Clearance: 45.5 mL/min (by C-G formula based on SCr of 1.19 mg/dL).   Medical History: Past Medical History:  Diagnosis Date   Allergy    Cancer (HCC)    Skin cancer   Cataract    Coronary artery disease    Diabetes mellitus    Diverticulosis    Esophageal stricture    GERD (gastroesophageal reflux disease)    Hyperlipidemia    Hypertension    Peripheral vascular disease (HCC)     Assessment: 77 YO M with ongoing pain s/p fall ~ 2 weeks. Had additional imaging 4/22, which showed aneurysm for which pt was sent to the ED for further imaging. [place] and CT showed 6cm infrarenal abdominal aortic aneurysm noted with large amount of mural thrombus. Also has acute PE. Pharmacy consulted to dose heparin. Transitioned to Xarelto inpatient 5/2 - Last dose 5/3 @ 0834. Noted to have melenic stool and slight decrease in Hg. Pharmacy consulted to transition patient from Xarelto back to heparin infusion on 5/3.  5/6 AM update: Infusion was held at 0200 in prep for a colonoscopy this morning. Patient is s/p procedure. Heparin infusion can be restarted immediately  Goal of Therapy:  Heparin  level 0.3-0.5 units/mL aPTT 66-85 seconds  Monitor platelets by anticoagulation protocol: Yes  Plan:  Restart heparin gtt at 1200 units/hr HL/aPTT 1800 HL/aPTT/CBC daily. May DC aPTT once correlates with HL  Greta Doom BS, PharmD, BCPS Clinical Pharmacist 03/07/2023 10:48 AM  Contact: [phone] after 3 PM  "Be curious, not judgmental..." -Debbora Dus

## 2023-03-08 ENCOUNTER — Ambulatory Visit: Payer: Medicare HMO | Admitting: Gastroenterology

## 2023-03-08 DIAGNOSIS — I2699 Other pulmonary embolism without acute cor pulmonale: Secondary | ICD-10-CM | POA: Diagnosis not present

## 2023-03-08 LAB — CBC
HCT: 25.1 % — ABNORMAL LOW (ref 39.0–52.0)
Hemoglobin: 7.7 g/dL — ABNORMAL LOW (ref 13.0–17.0)
MCH: 25.8 pg — ABNORMAL LOW (ref 26.0–34.0)
MCHC: 30.7 g/dL (ref 30.0–36.0)
MCV: 83.9 fL (ref 80.0–100.0)
Platelets: 237 10*3/uL (ref 150–400)
RBC: 2.99 MIL/uL — ABNORMAL LOW (ref 4.22–5.81)
RDW: 20.5 % — ABNORMAL HIGH (ref 11.5–15.5)
WBC: 7.2 10*3/uL (ref 4.0–10.5)
nRBC: 0 % (ref 0.0–0.2)

## 2023-03-08 LAB — GLUCOSE, CAPILLARY
Glucose-Capillary: 89 mg/dL (ref 70–99)
Glucose-Capillary: 206 mg/dL — ABNORMAL HIGH (ref 70–99)
Glucose-Capillary: 102 mg/dL — ABNORMAL HIGH (ref 70–99)
Glucose-Capillary: 137 mg/dL — ABNORMAL HIGH (ref 70–99)

## 2023-03-08 NOTE — TOC Progression Note (Signed)
Transition of Care [facility]) - Progression Note    Patient Details  Name: Walter [name] MRN: [id number] Date of Birth: Jan 14, 1946  Transition of Care [facility]) CM/SW Contact  Carley Hammed, LCSW Phone Number: 03/08/2023, 11:10 AM  Clinical Narrative:    CSW was advised that DC will be held due to ongoning medical instability. CSW notified family and facility. Authorization is approved through 5/11. TOC will continue to follow for DC needs.    Expected Discharge Plan: Skilled Nursing Facility Barriers to Discharge: Continued Medical Work up, SNF Pending bed offer, English as a second language teacher  Expected Discharge Plan and Services     Post Acute Care Choice: Skilled Nursing Facility Living arrangements for the past 2 months: Single Family Home                                       Social Determinants of Health (SDOH) Interventions SDOH Screenings   Food Insecurity: Patient Declined (02/22/2023)  Housing: Low Risk  (02/22/2023)  Transportation Needs: Patient Declined (02/22/2023)  Utilities: Patient Declined (02/22/2023)  Alcohol Screen: Low Risk  (04/23/2022)  Depression (PHQ2-9): High Risk (02/16/2023)  Financial Resource Strain: Low Risk  (04/23/2022)  Physical Activity: Inactive (04/23/2022)  Social Connections: Socially Integrated (04/23/2022)  Stress: No Stress Concern Present (04/23/2022)  Tobacco Use: Medium Risk (03/07/2023)    Readmission Risk Interventions     No data to display

## 2023-03-08 NOTE — Progress Notes (Signed)
Physical Therapy Treatment Patient Details Name: Walter [name] MRN: [id number] DOB: 10-30-1946 Today's Date: 03/08/2023   History of Present Illness 77 y.o. male presenting 4/22 with back pain, work up revealed AAA aneurysm. Hospital course complicated by finding of acute PE and was started on heparin. X-ray showed an age-indeterminate L1 compression fracture. S/P kyphoplasty 03/02/23. He has also not been able to tolerate most foods for several months. PMH: CAD, DMII, esophageal stricture, GERD, HTN, DDD, HLD, and dementia.    PT Comments    Patient received in bed, states he is "stuck here again". Patient is agreeable to PT session. He required min A for supine to sit to raise trunk. Min guard for sit to stand and min guard for ambulation of 150 feet with RW. Patient will continue to benefit from skilled PT to improve strength and safety with mobility for reduced fall risk.      Recommendations for follow up therapy are one component of a multi-disciplinary discharge planning process, led by the attending physician.  Recommendations may be updated based on patient status, additional functional criteria and insurance authorization.  Follow Up Recommendations  Can patient physically be transported by private vehicle: Yes    Assistance Recommended at Discharge Intermittent Supervision/Assistance  Patient can return home with the following A little help with walking and/or transfers;A little help with bathing/dressing/bathroom;Help with stairs or ramp for entrance;Assist for transportation   Equipment Recommendations  Rolling walker (2 wheels)    Recommendations for Other Services       Precautions / Restrictions Precautions Precautions: Fall Restrictions Weight Bearing Restrictions: No     Mobility  Bed Mobility Overal bed mobility: Needs Assistance Bed Mobility: Supine to Sit   Sidelying to sit: Min assist   Sit to supine: Modified independent (Device/Increase time)         Transfers Overall transfer level: Needs assistance Equipment used: Rolling walker (2 wheels) Transfers: Sit to/from Stand Sit to Stand: Min guard                Ambulation/Gait Ambulation/Gait assistance: Min guard Gait Distance (Feet): 150 Feet Assistive device: Rolling walker (2 wheels) Gait Pattern/deviations: Step-through pattern, Decreased step length - right, Decreased step length - left, Shuffle Gait velocity: reduced     General Gait Details: shuffle, short steps with initial ambulation, improves step length and foot clearance with increased distance.   Stairs             Wheelchair Mobility    Modified Rankin (Stroke Patients Only)       Balance Overall balance assessment: History of Falls, Needs assistance Sitting-balance support: Feet supported Sitting balance-Leahy Scale: Good     Standing balance support: Bilateral upper extremity supported, During functional activity, Reliant on assistive device for balance Standing balance-Leahy Scale: Fair                              Cognition Arousal/Alertness: Awake/alert Behavior During Therapy: WFL for tasks assessed/performed Overall Cognitive Status: History of cognitive impairments - at baseline                                          Exercises      General Comments        Pertinent Vitals/Pain Pain Assessment Pain Assessment: No/denies pain Pain Intervention(s): Monitored during session  Home Living                          Prior Function            PT Goals (current goals can now be found in the care plan section) Acute Rehab PT Goals Patient Stated Goal: get home eventually PT Goal Formulation: With patient Time For Goal Achievement: 03/09/23 Potential to Achieve Goals: Good Progress towards PT goals: Progressing toward goals    Frequency    Min 3X/week      PT Plan Current plan remains appropriate    Co-evaluation               AM-PAC PT "6 Clicks" Mobility   Outcome Measure  Help needed turning from your back to your side while in a flat bed without using bedrails?: None Help needed moving from lying on your back to sitting on the side of a flat bed without using bedrails?: A Little Help needed moving to and from a bed to a chair (including a wheelchair)?: A Little Help needed standing up from a chair using your arms (e.g., wheelchair or bedside chair)?: A Little Help needed to walk in hospital room?: A Little Help needed climbing 3-5 steps with a railing? : A Little 6 Click Score: 19    End of Session Equipment Utilized During Treatment: Gait belt Activity Tolerance: Patient tolerated treatment well Patient left: in bed;with call bell/phone within reach Nurse Communication: Mobility status PT Visit Diagnosis: Unsteadiness on feet (R26.81);History of falling (Z91.81);Difficulty in walking, not elsewhere classified (R26.2)     Time: 1610-9604 PT Time Calculation (min) (ACUTE ONLY): 12 min  Charges:  $Gait Training: 8-22 mins                     Khalon Cansler, PT, GCS 03/08/23,11:56 AM

## 2023-03-08 NOTE — Care Management Important Message (Signed)
Important Message  Patient Details  Name: Walter [name] MRN: [id number] Date of Birth: 07/15/1946   Medicare Important Message Given:  Yes     Sherilyn Banker 03/08/2023, 3:28 PM

## 2023-03-08 NOTE — Progress Notes (Signed)
PROGRESS NOTE  Walter [name]  DOB: 09/23/1946  PCP: Kristian Covey, MD ZOX:[id number]  DOA: 02/21/2023  LOS: 15 days  Hospital Day: 16  Brief narrative: Walter [name] is a 77 y.o. male with PMH significant for DM2, HTN, HLD, CAD, PVD, dementia, GERD, diverticulosis. 4/17, patient had a lumbar spine x-ray done for 2 weeks of lower back pain after a fall.  X-ray showed L1 compression fracture but also showed 5 cm distal AAA. 4/22, patient was sent to the ED at [facility] for evaluation of AAA. Ultrasound aorta showed 6 cm infrarenal AAA with large amount of mural thrombus. 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 Images that extended into the lower chest noted small subsegmental incidental pulmonary embolus in right lower lobe.   Vascular surgery was consulted Patient was started on IV heparin Patient was admitted to [facility] at [facility] Ultrasound duplex of lower extremities showed acute DVT of right posterior tibial veins.   Subjective: Patient was seen and examined this morning.   Lying on bed.  Not in distress.  No active obvious bleeding but hemoglobin dropping again after Eliquis was started yesterday.  Wife not at bedside.  Updated on the phone.  Assessment and plan: Acute on chronic anemia Unclear baseline hemoglobin as the most recent blood work prior to this hospitalization was from 2020 with hemoglobin more than 12.  However ferritin level is low suggestive of chronic slow bleeding. Because of the new diagnosis of DVT/PE, he had to be started on anticoagulation.  He was first tried on Xarelto after which hemoglobin dropped.  EGD/colonoscopy done on 5/6. EGD showed a single angiectasia in the duodenum which was treated with APC.  Colonoscopy showed a cecal polyp, and ascending colon polyp and severe diverticulosis from ascending colon to sigmoid colon.  Anticoagulation  with Eliquis was resumed after GI clearance. Hemoglobin dropping again. Vascular surgery consulted for IVC filter placement after which anticoagulation can be stopped.  Continue to monitor hemoglobin. Recent Labs    12/08/22 1118 02/16/23 1059 02/23/23 0625 02/23/23 0626 02/23/23 8119 02/23/23 1539 02/24/23 0709 03/04/23 0432 03/05/23 0041 03/06/23 0703 03/07/23 0346 03/08/23 0021  HGB  --    < >  --   --  8.4*  --    < > 7.1* 6.9* 8.6* 8.4* 7.7*  MCV  --    < >  --   --  77.4*  --    < > 83.3 82.2 83.6 83.4 83.9  VITAMINB12 320  --   --   --   --  678  --   --   --   --   --   --   FOLATE  --   --  13.2  --   --   --   --   --   --   --   --   --   FERRITIN  --   --   --   --  23*  --   --   --   --   --   --   --   TIBC  --   --   --   --  377  --   --   --   --   --   --   --   IRON  --   --   --   --  16*  --   --   --   --   --   --   --  RETICCTPCT  --   --   --  2.0  --   --   --   --   --   --   --   --    < > = values in this interval not displayed.     Subsegmental pulmonary embolism Acute DVT of right posterior tibial veins PE seen in initial CT angio done for evaluation of AAA.   DVT of right posterior tibial veins are noted as well. DVT/PE suspected to be due to impaired mobility for 2 weeks after a fall.  Patient was ambulatory prior to that. As mentioned above, anticoagulation attempt failed because of drop in hemoglobin.  Vascular surgery consulted for IVC with replacement.   Acute to subacute L1 compression fracture Likely secondary to fall 2 weeks prior to presentation MRI 4/26 showed acute to subacute L1 compression fracture with up to 70% vertebral body height loss and 3 mm retropulsion of the posterior cortex.  No spinal canal stenosis or neural foraminal narrowing. 5/1, underwent L1 balloon kyphoplasty by IR.  Biopsy negative for malignancy.. Currently on pain management with Oxy IR  Large infrarenal AAA with mural thrombus Vascular surgery evaluated  the patient and advised no need for immediate intervention for AAA until PE has been treated.   To follow-up in office in 6 months for repeat ultrasound.    Left psoas hematoma 4/25, patient complained of severe left thigh pain. MRI lumbar spine 4/26 also showed left psoas muscle hematoma. Looks to be self-contained. Unclear if it was the primary etiology of pain. No longer having any pain. Hemodynamically stable.  Continue to monitor.     Essential hypertension Blood pressure stable with amlodipine, losartan   Prediabetes Hemoglobin A1c on 12/08/2022 was 5.8 Continue diet and lifestyle modification at this time   Mixed hyperlipidemia Continue Lipitor   GERD Continue Protonix   Dementia Continue Aricept, Namenda  Severe protein calorie malnutrition Declining oral intake for last year. Nutrition consult obtained. Started on supplements  Constipation Continue scheduled and as needed bowel regimen   Oral thrush Magic mouthwash  Impaired mobility Previously ambulatory but since since fall 2 weeks prior, he has been less ambulatory.  Has also suffered physical deconditioning.  PT eval obtained.  SNF recommended.  Goals of care   Code Status: DNR     DVT prophylaxis:  SCDs Start: 02/21/23 1945 apixaban (ELIQUIS) tablet 5 mg   Antimicrobials: None Fluid: None currently Consultants: IR, GI, vascular surgery Family Communication: Patient's wife at bedside. Status: Inpatient Level of care:  Med-Surg   Patient from: Home Anticipated d/c to: SNF Needs to continue in-hospital care:  Unable to discharge today.  Needs IVC filter   Diet:  Diet Order             Diet heart healthy/carb modified Room service appropriate? Yes; Fluid consistency: Thin  Diet effective now                   Scheduled Meds:  sodium chloride   Intravenous Once   amLODipine  5 mg Oral Daily   apixaban  5 mg Oral BID   atorvastatin  40 mg Oral Daily   cyanocobalamin  2,000 mcg Oral  Daily   diphenhydrAMINE  25 mg Oral Once   docusate sodium  100 mg Oral BID   donepezil  10 mg Oral QHS   feeding supplement  237 mL Oral BID BM   ferrous sulfate  325 mg Oral Q breakfast  insulin aspart  0-9 Units Subcutaneous TID WC   lidocaine  1 patch Transdermal Q24H   losartan  100 mg Oral Daily   magic mouthwash  5 mL Oral TID   megestrol  20 mg Oral BID   memantine  10 mg Oral BID   multivitamin with minerals  1 tablet Oral Daily   pantoprazole  40 mg Oral Daily    PRN meds: acetaminophen **OR** acetaminophen, hydrALAZINE, iohexol, ondansetron **OR** ondansetron (ZOFRAN) IV, oxyCODONE   Infusions:      Antimicrobials: Anti-infectives (From admission, onward)    Start     Dose/Rate Route Frequency Ordered Stop   03/02/23 1050  ceFAZolin (ANCEF) IVPB 2g/100 mL premix        over 30 Minutes Intravenous Continuous PRN 03/02/23 1058 03/02/23 1050   03/02/23 0600  ceFAZolin (ANCEF) IVPB 2g/100 mL premix       Note to Pharmacy: Do not send to floor, will be given in Radiology dept prior to procedure.   2 g 200 mL/hr over 30 Minutes Intravenous On call 03/01/23 1039 03/03/23 0600       Nutritional status:  Body mass index is 21.37 kg/m.  Nutrition Problem: Severe Malnutrition Etiology: chronic illness Signs/Symptoms: severe fat depletion, moderate fat depletion, severe muscle depletion, energy intake < or equal to 75% for > or equal to 1 month     Objective: Vitals:   03/08/23 0620 03/08/23 0826  BP: (!) 141/61 134/64  Pulse: 71 79  Resp:  17  Temp: 98 F (36.7 C) 98.5 F (36.9 C)  SpO2: 97% 96%    Intake/Output Summary (Last 24 hours) at 03/08/2023 1404 Last data filed at 03/08/2023 0920 Gross per 24 hour  Intake 303.48 ml  Output 275 ml  Net 28.48 ml    Filed Weights   02/21/23 1159 02/28/23 0438 03/07/23 0813  Weight: 64.9 kg 61.9 kg 61.9 kg   Weight change:  Body mass index is 21.37 kg/m.   Physical Exam: General exam: Pleasant, elderly  Caucasian male. Not in distress Skin: No rashes, lesions or ulcers. HEENT: Atraumatic, normocephalic, no obvious bleeding Lungs: Clear to auscultation bilaterally CVS: Regular rate and rhythm, no murmur GI/Abd soft, nontender, nondistended, bowel sound present CNS: Alert, awake, oriented x 3 Psychiatry: Mood appropriate Extremities: No pedal edema, no calf tenderness  Data Review: I have personally reviewed the laboratory data and studies available.  F/u labs ordered Unresulted Labs (From admission, onward)     Start     Ordered   03/06/23 0500  CBC  Daily,   R     Comments: Call MD for hemoglobin less than 7   See Hyperspace for full Linked Orders Report.   03/05/23 1400   03/05/23 0500  CBC  Daily,   R      03/04/23 1355            Total time spent in review of labs and imaging, patient evaluation, formulation of plan, documentation and communication with family: 45 minutes  Signed, Lorin Glass, MD [facility] 03/08/2023

## 2023-03-08 NOTE — Plan of Care (Signed)

## 2023-03-08 NOTE — Progress Notes (Signed)
Occupational Therapy Treatment Patient Details Name: Walter [name] MRN: [id number] DOB: 02-16-1946 Today's Date: 03/08/2023   History of present illness 77 y.o. male presenting 4/22 with back pain, work up revealed AAA aneurysm. Hospital course complicated by finding of acute PE and was started on heparin. X-ray showed an age-indeterminate L1 compression fracture. S/P kyphoplasty 03/02/23. He has also not been able to tolerate most foods for several months. PMH: CAD, DMII, esophageal stricture, GERD, HTN, DDD, HLD, and dementia.   OT comments  Pt feeling good today, motivated to participate. Assessed Pt goals today, displays improved balance and independence with transfers/ambulation, able to stand at sink supervision to perform ADLs, toileting/hygiene ADLs supervision while standing unsupported as needed, mod I for bed mobility.  Pt goals updated, expected to continue progressing through skilled OT.    Recommendations for follow up therapy are one component of a multi-disciplinary discharge planning process, led by the attending physician.  Recommendations may be updated based on patient status, additional functional criteria and insurance authorization.    Assistance Recommended at Discharge Frequent or constant Supervision/Assistance  Patient can return home with the following  A little help with walking and/or transfers;A little help with bathing/dressing/bathroom;Assistance with cooking/housework;Direct supervision/assist for medications management;Direct supervision/assist for financial management;Assist for transportation;Help with stairs or ramp for entrance   Equipment Recommendations  Other (comment) (RW)    Recommendations for Other Services      Precautions / Restrictions Precautions Precautions: Fall Precaution Comments: Parkinson's gait Restrictions Weight Bearing Restrictions: No       Mobility Bed Mobility Overal bed mobility: Modified Independent              General bed mobility comments: able to get in/out of bed mod I, no assistance needed.    Transfers Overall transfer level: Needs assistance Equipment used: Rolling walker (2 wheels) Transfers: Sit to/from Stand Sit to Stand: Supervision                 Balance Overall balance assessment: History of Falls, Needs assistance Sitting-balance support: Feet supported Sitting balance-Leahy Scale: Good     Standing balance support: Bilateral upper extremity supported, During functional activity, Reliant on assistive device for balance Standing balance-Leahy Scale: Fair Standing balance comment: RW for support, ableto stand and perofrm dressing/wiping/sink level ADLs unsupported as needed.                           ADL either performed or assessed with clinical judgement   ADL Overall ADL's : Needs assistance/impaired     Grooming: Supervision/safety;Standing           Upper Body Dressing : Set up   Lower Body Dressing: Set up   Toilet Transfer: Supervision/safety   Toileting- Clothing Manipulation and Hygiene: Supervision/safety;Sit to/from stand       Functional mobility during ADLs: Supervision/safety General ADL Comments: Pt improving with balance and gait, able to stand at sink and perform ADLs. Pt able tos tand at toilet to perform perineal hygiene supervision, no LOB.    Extremity/Trunk Assessment              Vision       Perception     Praxis      Cognition Arousal/Alertness: Awake/alert Behavior During Therapy: WFL for tasks assessed/performed Overall Cognitive Status: History of cognitive impairments - at baseline  General Comments: no deficits observed, had a pleasant convo above growing up in the same hometown, good memory/recall of current medical needs and plans.        Exercises      Shoulder Instructions       General Comments      Pertinent Vitals/ Pain       Pain  Assessment Pain Assessment: No/denies pain  Home Living                                          Prior Functioning/Environment              Frequency  Min 2X/week        Progress Toward Goals  OT Goals(current goals can now be found in the care plan section)  Progress towards OT goals: Progressing toward goals  Acute Rehab OT Goals Patient Stated Goal: to return home OT Goal Formulation: With patient Time For Goal Achievement: 03/09/23 Potential to Achieve Goals: Good ADL Goals Pt Will Perform Lower Body Bathing: sit to/from stand;with supervision Pt Will Transfer to Toilet: with supervision Pt Will Perform Toileting - Clothing Manipulation and hygiene: with modified independence Additional ADL Goal #1: Pt will independently identify 3 strategies to reduce risk of falls  Plan Discharge plan remains appropriate    Co-evaluation                 AM-PAC OT "6 Clicks" Daily Activity     Outcome Measure   Help from another person eating meals?: None Help from another person taking care of personal grooming?: A Little Help from another person toileting, which includes using toliet, bedpan, or urinal?: A Little Help from another person bathing (including washing, rinsing, drying)?: A Little Help from another person to put on and taking off regular upper body clothing?: A Little Help from another person to put on and taking off regular lower body clothing?: A Little 6 Click Score: 19    End of Session Equipment Utilized During Treatment: Gait belt;Rolling walker (2 wheels)  OT Visit Diagnosis: Unsteadiness on feet (R26.81);Other abnormalities of gait and mobility (R26.89);Muscle weakness (generalized) (M62.81);History of falling (Z91.81);Pain   Activity Tolerance Patient tolerated treatment well   Patient Left in bed;with call bell/phone within reach;with bed alarm set   Nurse Communication Mobility status        Time: 1610-9604 OT Time  Calculation (min): 29 min  Charges: OT General Charges $OT Visit: 1 Visit OT Treatments $Self Care/Home Management : 23-37 mins  [place], OTR/L   Alexis Goodell 03/08/2023, 4:50 PM

## 2023-03-08 NOTE — Consult Note (Addendum)
CONSULT NOTE   MRN : [id number]  Reason for Consult: DVT Referring Physician: IM  History of Present Illness: 77 y/o male who was recently seen by DR. 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  There is reconstitution of the right superficial femoral and deep femoral arteries proximally.during lumbar spine work up for compression fracture, as well as acute PE.   Status post vertebral body augmentation using balloon kyphoplasty at L1 as described without event. The PE was managed with anticoagulation with Xarelto, now on Eliquis with HGB drops post transfusion 2 units total PRBC's.    Based on Dr. Darcella Cheshire plan the patient has PE and B LE DVT  in posterior tibial veins. .  Pending discharged to rehab with plans to re evaluate EVAR or  AUI with femorofemoral bypass, and IVC filter placement.        Current Facility-Administered Medications  Medication Dose Route Frequency Provider Last Rate Last Admin   0.9 %  sodium chloride infusion (Manually program via Guardrails IV Fluids)   Intravenous Once Adefeso, Oladapo, DO       acetaminophen (TYLENOL) tablet 650 mg  650 mg Oral Q6H PRN Adefeso, Oladapo, DO   650 mg at 03/05/23 2341   Or   acetaminophen (TYLENOL) suppository 650 mg  650 mg Rectal Q6H PRN Adefeso, Oladapo, DO       amLODipine (NORVASC) tablet 5 mg  5 mg Oral Daily Adefeso, Oladapo, DO   5 mg at 03/08/23 0928   apixaban (ELIQUIS) tablet 5 mg  5 mg Oral BID Reome, Earle J, RPH   5 mg at 03/08/23 0928   atorvastatin (LIPITOR) tablet 40 mg  40 mg Oral Daily Adefeso, Oladapo, DO   40 mg at 03/08/23 4098   cyanocobalamin (VITAMIN B12) tablet 2,000 mcg  2,000 mcg Oral Daily Adefeso, Oladapo, DO   2,000 mcg at 03/08/23 1191   diphenhydrAMINE (BENADRYL) capsule 25 mg  25 mg Oral Once Luiz Iron, NP       docusate sodium (COLACE) capsule 100 mg  100 mg  Oral BID Hollice Espy, MD   100 mg at 03/08/23 0929   donepezil (ARICEPT) tablet 10 mg  10 mg Oral QHS Adefeso, Oladapo, DO   10 mg at 03/07/23 2140   feeding supplement (ENSURE ENLIVE / ENSURE PLUS) liquid 237 mL  237 mL Oral BID BM Hollice Espy, MD   237 mL at 03/08/23 0929   ferrous sulfate tablet 325 mg  325 mg Oral Q breakfast Adefeso, Oladapo, DO   325 mg at 03/08/23 4782   hydrALAZINE (APRESOLINE) tablet 10 mg  10 mg Oral Q6H PRN Hollice Espy, MD       insulin aspart (novoLOG) injection 0-9 Units  0-9 Units Subcutaneous TID WC Hollice Espy, MD   3 Units at 03/07/23 1844   iohexol (OMNIPAQUE) 300 MG/ML solution 50 mL  50 mL Intra-arterial Once PRN Deveshwar, Simonne Maffucci, MD       lidocaine (LIDODERM) 5 % 1 patch  1 patch Transdermal Q24H Doristine Counter, RPH   1 patch at 03/07/23 1959   losartan (COZAAR) tablet 100 mg  100 mg Oral Daily Adefeso, Oladapo, DO   100 mg at 03/08/23 9562   magic mouthwash  5 mL Oral TID Hollice Espy, MD   5 mL at  03/08/23 4098   megestrol (MEGACE) tablet 20 mg  20 mg Oral BID Hollice Espy, MD   20 mg at 03/08/23 1191   memantine (NAMENDA) tablet 10 mg  10 mg Oral BID Adefeso, Oladapo, DO   10 mg at 03/08/23 4782   multivitamin with minerals tablet 1 tablet  1 tablet Oral Daily Hollice Espy, MD   1 tablet at 03/08/23 0928   ondansetron (ZOFRAN) tablet 4 mg  4 mg Oral Q6H PRN Frankey Shown, DO       Or   ondansetron (ZOFRAN) injection 4 mg  4 mg Intravenous Q6H PRN Adefeso, Oladapo, DO   4 mg at 02/27/23 9562   oxyCODONE (Oxy IR/ROXICODONE) immediate release tablet 2.5 mg  2.5 mg Oral Q4H PRN Hollice Espy, MD   2.5 mg at 03/02/23 1308   pantoprazole (PROTONIX) EC tablet 40 mg  40 mg Oral Daily Adefeso, Oladapo, DO   40 mg at 03/08/23 0928    Pt meds include: Statin :Yes Betablocker: No ASA: No Other anticoagulants/antiplatelets: Eliquis  Past Medical History:  Diagnosis Date   Allergy    Cancer (HCC)    Skin  cancer   Cataract    Coronary artery disease    Diabetes mellitus    Diverticulosis    Esophageal stricture    GERD (gastroesophageal reflux disease)    Hyperlipidemia    Hypertension    Peripheral vascular disease (HCC)     Past Surgical History:  Procedure Laterality Date   CORONARY ANGIOPLASTY WITH STENT PLACEMENT     ESOPHAGOGASTRODUODENOSCOPY  06/07/2012   Procedure: ESOPHAGOGASTRODUODENOSCOPY (EGD);  Surgeon: Hart Carwin, MD;  Location: [facility];  Service: Endoscopy;  Laterality: N/A;   IR KYPHO LUMBAR INC FX REDUCE BONE BX UNI/BIL CANNULATION INC/IMAGING  03/02/2023   SHOULDER SURGERY  2006    Social History Social History   Tobacco Use   Smoking status: Former    Types: Cigarettes    Quit date: 11/02/1999    Years since quitting: 23.3   Smokeless tobacco: Never  Vaping Use   Vaping Use: Never used  Substance Use Topics   Alcohol use: No   Drug use: No    Family History Family History  Problem Relation Age of Onset   Breast cancer Mother    Healthy Son    Healthy Son    Parkinson's disease Sister    Colon cancer Neg Hx    Esophageal cancer Neg Hx    Rectal cancer Neg Hx    Stomach cancer Neg Hx     No Known Allergies   REVIEW OF SYSTEMS  General: [ ]  Weight loss, [ ]  Fever, [ ]  chills Neurologic: [ ]  Dizziness, [ ]  Blackouts, [ ]  Seizure [ ]  Stroke, [ ]  "Mini stroke", [ ]  Slurred speech, [ ]  Temporary blindness; [ ]  weakness in arms or legs, [ ]  Hoarseness [ ]  Dysphagia Cardiac: [ ]  Chest pain/pressure, [ ]  Shortness of breath at rest [ ]  Shortness of breath with exertion, [ ]  Atrial fibrillation or irregular heartbeat  Vascular: [ ]  Pain in legs with walking, [ ]  Pain in legs at rest, [ ]  Pain in legs at night,  [ ]  Non-healing ulcer, [ x] Blood clot in vein/DVT,   Pulmonary: [ ]  Home oxygen, [ ]  Productive cough, [ ]  Coughing up blood, [ ]  Asthma,  [ ]  Wheezing [ ]  COPD Musculoskeletal:  [ ]  Arthritis, [x ] Low back pain, [ ]  Joint  pain Hematologic: [ ]  Easy Bruising, [ ]  Anemia; [ ]  Hepatitis Gastrointestinal: [ ]  Blood in stool, [ ]  Gastroesophageal Reflux/heartburn, Urinary: [ ]  chronic Kidney disease, [ ]  on HD - [ ]  MWF or [ ]  TTHS, [ ]  Burning with urination, [ ]  Difficulty urinating Skin: [ ]  Rashes, [ ]  Wounds Psychological: [ ]  Anxiety, [ ]  Depression  Physical Examination Vitals:   03/07/23 1554 03/07/23 2232 03/08/23 0620 03/08/23 0826  BP: 130/61 (!) 157/62 (!) 141/61 134/64  Pulse: 78 74 71 79  Resp: 16   17  Temp: 97.8 F (36.6 C) 98.3 F (36.8 C) 98 F (36.7 C) 98.5 F (36.9 C)  TempSrc: Oral Oral  Oral  SpO2: 99% 96% 97% 96%  Weight:      Height:       Body mass index is 21.37 kg/m.  General:  WDWN in NAD HENT: WNL Eyes: Pupils equal Pulmonary: normal non-labored breathing , without Rales, rhonchi,  wheezing Cardiac: RRR, without  Murmurs, rubs or gallops; No carotid bruits Abdomen: soft, NT, no masses, palpable aortic pulse Skin: no rashes, ulcers noted;  no Gangrene , no cellulitis; no open wounds;   Vascular Exam/Pulses:radial, femoral left, non palpable right femoral, feet warm without ischemic changes.    Musculoskeletal: no muscle wasting or atrophy; no edema  Neurologic: A&O X 3; Appropriate Affect ;  SENSATION: normal; MOTOR FUNCTION: 5/5 Symmetric Speech is fluent/normal   Significant Diagnostic Studies: CBC Lab Results  Component Value Date   WBC 7.2 03/08/2023   HGB 7.7 (L) 03/08/2023   HCT 25.1 (L) 03/08/2023   MCV 83.9 03/08/2023   PLT 237 03/08/2023    BMET    Component Value Date/Time   NA 136 03/05/2023 0041   K 4.4 03/05/2023 0041   CL 106 03/05/2023 0041   CO2 23 03/05/2023 0041   GLUCOSE 120 (H) 03/05/2023 0041   GLUCOSE 127 (H) 08/17/2006 1059   BUN 30 (H) 03/05/2023 0041   CREATININE 1.19 03/05/2023 0041   CALCIUM 8.5 (L) 03/05/2023 0041   GFRNONAA >60 03/05/2023 0041   GFRAA >60 01/07/2019 0751   Estimated Creatinine Clearance: 45.5  mL/min (by C-G formula based on SCr of 1.19 mg/dL).  COAG Lab Results  Component Value Date   INR 1.2 03/02/2023     Non-Invasive Vascular Imaging:     RIGHT: DVT study 02/24/23  - Findings consistent with acute deep vein thrombosis involving the right  posterior tibial veins.   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 Acute PE and right LE DVT PT vein He was started on Xarelto for anticoagulation.  Anemia with HGB drop 6.0. 5/4.  Transfused 1 unit 4/23.  1 unit 03/05/23.  HGB 7.1.  GI was consulted. 5/6, underwent EGD and colonoscopy.  EGD showed a single angiectasia in the duodenum which was treated with APC.  Colonoscopy showed a cecal polyp, and ascending colon polyp and severe diverticulosis from ascending colon to sigmoid colon.  Okay to resume anticoagulation per GI.   Reported intolerance to anticoagulation now indicated for IVC filter placement.    Mosetta Pigeon 03/08/2023 11:55 AM   I have independently interviewed and examined patient and agree with PA assessment and plan above. Patient well know to me with AAA and found to have incidental pe with  dvt now not tolerating anticoagulation.  Plan will be for IVC filter placement tomorrow.  I have held Eliquis and made him n.p.o. past midnight and discussed this with the patient but was unable to reach the wife today.  Zay Yeargan C. Randie Heinz, MD [facility] Office: [phone] Pager: [phone]

## 2023-03-08 NOTE — Anesthesia Postprocedure Evaluation (Signed)
Anesthesia Post Note  Patient: Walter [name]  Procedure(s) Performed: COLONOSCOPY WITH PROPOFOL ESOPHAGOGASTRODUODENOSCOPY (EGD) WITH PROPOFOL HOT HEMOSTASIS (ARGON PLASMA COAGULATION/BICAP) BIOPSY POLYPECTOMY     Patient location during evaluation: Endoscopy Anesthesia Type: MAC Level of consciousness: awake and alert Pain management: pain level controlled Vital Signs Assessment: post-procedure vital signs reviewed and stable Respiratory status: spontaneous breathing, nonlabored ventilation, respiratory function stable and patient connected to nasal cannula oxygen Cardiovascular status: stable and blood pressure returned to baseline Postop Assessment: no apparent nausea or vomiting Anesthetic complications: no   No notable events documented.  Last Vitals:  Vitals:   03/08/23 0620 03/08/23 0826  BP: (!) 141/61 134/64  Pulse: 71 79  Resp:  17  Temp: 36.7 C 36.9 C  SpO2: 97% 96%    Last Pain:  Vitals:   03/08/23 0928  TempSrc:   PainSc: 0-No pain                 [name] S

## 2023-03-09 ENCOUNTER — Encounter (HOSPITAL_COMMUNITY): Payer: Self-pay | Admitting: Vascular Surgery

## 2023-03-09 ENCOUNTER — Encounter: Payer: Self-pay | Admitting: Internal Medicine

## 2023-03-09 ENCOUNTER — Encounter (HOSPITAL_COMMUNITY)
Admission: EM | Disposition: A | Discharge status: Skilled Nursing Facility | Payer: Self-pay | Source: Home / Self Care | Attending: Internal Medicine

## 2023-03-09 DIAGNOSIS — I2699 Other pulmonary embolism without acute cor pulmonale: Secondary | ICD-10-CM | POA: Diagnosis not present

## 2023-03-09 DIAGNOSIS — I82499 Acute embolism and thrombosis of other specified deep vein of unspecified lower extremity: Secondary | ICD-10-CM

## 2023-03-09 HISTORY — PX: IVC FILTER INSERTION: CATH118245

## 2023-03-09 LAB — CBC
HCT: 23.9 % — ABNORMAL LOW (ref 39.0–52.0)
Hemoglobin: 7.5 g/dL — ABNORMAL LOW (ref 13.0–17.0)
MCH: 26.5 pg (ref 26.0–34.0)
MCHC: 31.4 g/dL (ref 30.0–36.0)
MCV: 84.5 fL (ref 80.0–100.0)
Platelets: 213 10*3/uL (ref 150–400)
RBC: 2.83 MIL/uL — ABNORMAL LOW (ref 4.22–5.81)
RDW: 20.6 % — ABNORMAL HIGH (ref 11.5–15.5)
WBC: 6.4 10*3/uL (ref 4.0–10.5)
nRBC: 0 % (ref 0.0–0.2)

## 2023-03-09 LAB — GLUCOSE, CAPILLARY
Glucose-Capillary: 95 mg/dL (ref 70–99)
Glucose-Capillary: 87 mg/dL (ref 70–99)
Glucose-Capillary: 234 mg/dL — ABNORMAL HIGH (ref 70–99)

## 2023-03-09 LAB — SURGICAL PATHOLOGY

## 2023-03-09 SURGERY — IVC FILTER INSERTION
Anesthesia: LOCAL

## 2023-03-09 MED ORDER — LIDOCAINE HCL (PF) 1 % IJ SOLN
INTRAMUSCULAR | Status: AC
  Filled 2023-03-09: qty 30

## 2023-03-09 MED ORDER — LIDOCAINE HCL (PF) 1 % IJ SOLN
INTRAMUSCULAR | Status: DC | PRN
  Administered 2023-03-09: 10 mL

## 2023-03-09 MED ORDER — IODIXANOL 320 MG/ML IV SOLN
INTRAVENOUS | Status: DC | PRN
  Administered 2023-03-09: 45 mL

## 2023-03-09 MED ORDER — HEPARIN (PORCINE) IN NACL 1000-0.9 UT/500ML-% IV SOLN
INTRAVENOUS | Status: DC | PRN
  Administered 2023-03-09: 500 mL

## 2023-03-09 MED ORDER — SODIUM CHLORIDE 0.9 % IV SOLN: INTRAVENOUS | Status: DC

## 2023-03-09 SURGICAL SUPPLY — 14 items
CATH ANGIO 5F BER2 65CM (CATHETERS) IMPLANT
COVER DOME SNAP 22 D (MISCELLANEOUS) IMPLANT
FILTER VC CELECT-FEMORAL (Filter) IMPLANT
KIT MICROPUNCTURE NIT STIFF (SHEATH) IMPLANT
KIT PV (KITS) ×1 IMPLANT
PROTECTION STATION PRESSURIZED (MISCELLANEOUS) ×1
SHEATH PINNACLE 5F 10CM (SHEATH) IMPLANT
STATION PROTECTION PRESSURIZED (MISCELLANEOUS) IMPLANT
STOPCOCK MORSE 400PSI 3WAY (MISCELLANEOUS) IMPLANT
SYR MEDRAD MARK 7 150ML (SYRINGE) ×1 IMPLANT
TRANSDUCER W/STOPCOCK (MISCELLANEOUS) ×1 IMPLANT
TRAY PV CATH (CUSTOM PROCEDURE TRAY) ×1 IMPLANT
TUBING CONTRAST HIGH PRESS 20 (MISCELLANEOUS) IMPLANT
WIRE ROSEN-J .035X180CM (WIRE) IMPLANT

## 2023-03-09 NOTE — Progress Notes (Signed)
Received patient NPO for IVC filter placement procedure. Orders reviewed, consent verified in chart. Per Dr. Pola Corn, am meds ok to be held. Lidocaine patch removed and replaced on lower back. Started continuous NS at 148mL/hr to left FA PIV. Oral care, CHG, gown change provided prior to pt leaving the unit. Patient left the unit in NAD. Spouse left the unit with the patients belongings to wait in the general waiting area d/t pt being transferred to a new unit post procedure.

## 2023-03-09 NOTE — Op Note (Addendum)
    Patient name: Walter [name] MRN: [id number] DOB: December 04, 1945 Sex: male  03/09/2023 Pre-operative Diagnosis: Deep venous thrombosis, pulmonary embolus Post-operative diagnosis:  Same Surgeon:  Victorino Sparrow, MD Procedure Performed: 1.  Ultrasound-guided micropuncture access of the right common femoral vein 2.  Cavogram 3.  Inferior vena cava filter-Cook select placement 4.  Manual pressure for venotomy    Indications: Patient is a 77 year old male with DVT, pulmonary embolus, with recent bleed, unable to tolerate anticoagulation.  After discussing risk and benefits of IVC filter placement, Walter [name] elected to proceed.  With his comorbidities, and bleeding, he and his wife are aware this filter, while able to be removed, may be permanent.  Findings:  Widely patent inferior vena cava without thrombus.  Widely patent bilateral renal veins   Procedure:  The patient was identified in the holding area and taken to room 8.  The patient was then placed supine on the table and prepped and draped in the usual sterile fashion.  A time out was called.  Ultrasound was used to evaluate the right common femoral vein.  An ultrasound-guided micropuncture needle was used to access the common femoral vein.  A wire was run into the superior vena cava.  To the [facility] select inferior vena cava filter kit was opened, and the sheath was placed at the level of the renal veins.  Cavogram followed with opacification of bilateral renal veins.  This level was marked.  The sheath was pulled down below the level of the renal veins and the inferior vena cava filter was delivered.  After ensuring we were infrarenal, the filter was deployed.  There was a 20 degree tilt.  The filter hook was not against the wall.     Impression: Successful inferior vena cava filter placement. Patient will need anticoagulation when able, and will need anticoagulation until the filter is removed. He and his wife are aware the filter may be  permanent due to his comorbidities.   Fara Olden, MD [facility] [place] Office: [phone]

## 2023-03-09 NOTE — Progress Notes (Signed)
PROGRESS NOTE  Walter [name]  DOB: 26-Sep-1946  PCP: Kristian Covey, MD VHQ:[id number]  DOA: 02/21/2023  LOS: 16 days  Hospital Day: 17  Brief narrative: Walter [name] is a 77 y.o. male with PMH significant for DM2, HTN, HLD, CAD, PVD, dementia, GERD, diverticulosis. 4/17, patient had a lumbar spine x-ray done for 2 weeks of lower back pain after a fall.  X-ray showed L1 compression fracture but also showed 5 cm distal AAA. 4/22, patient was sent to the ED at [facility] for evaluation of AAA. Ultrasound aorta showed 6 cm infrarenal AAA with large amount of mural thrombus. 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 Images that extended into the lower chest noted small subsegmental incidental pulmonary embolus in right lower lobe.   Vascular surgery was consulted Patient was started on IV heparin Patient was admitted to [facility] at [facility] Ultrasound duplex of lower extremities showed acute DVT of right posterior tibial veins.   Subjective: Patient was seen and examined this morning.   Sitting up in recliner.  Not in distress.  Waiting for IVC filter placement today.  Wife at bedside.  Assessment and plan: Acute on chronic anemia Unclear baseline hemoglobin as the most recent blood work prior to this hospitalization was from 2020 with hemoglobin more than 12.  However ferritin level is low suggestive of chronic slow bleeding. Because of the new diagnosis of DVT/PE, he had to be started on anticoagulation.  He was first tried on Xarelto after which hemoglobin dropped.  EGD/colonoscopy done on 5/6. EGD showed a single angiectasia in the duodenum which was treated with APC.  Colonoscopy showed a cecal polyp, and ascending colon polyp and severe diverticulosis from ascending colon to sigmoid colon.  Anticoagulation with Eliquis was resumed after GI clearance.  However hemoglobin dropped  again. Vascular surgery consulted for IVC filter placement after which anticoagulation can be stopped.   Vascular surgery plans for IVC filter placement today.  Eliquis held.  Hemoglobin trend as below.  Repeat tomorrow. Recent Labs    12/08/22 1118 02/16/23 1059 02/23/23 0625 02/23/23 0626 02/23/23 4132 02/23/23 1539 02/24/23 0709 03/05/23 0041 03/06/23 0703 03/07/23 0346 03/08/23 0021 03/09/23 0051  HGB  --    < >  --   --  8.4*  --    < > 6.9* 8.6* 8.4* 7.7* 7.5*  MCV  --    < >  --   --  77.4*  --    < > 82.2 83.6 83.4 83.9 84.5  VITAMINB12 320  --   --   --   --  678  --   --   --   --   --   --   FOLATE  --   --  13.2  --   --   --   --   --   --   --   --   --   FERRITIN  --   --   --   --  23*  --   --   --   --   --   --   --   TIBC  --   --   --   --  377  --   --   --   --   --   --   --   IRON  --   --   --   --  16*  --   --   --   --   --   --   --   RETICCTPCT  --   --   --  2.0  --   --   --   --   --   --   --   --    < > = values in this interval not displayed.     Subsegmental pulmonary embolism Acute DVT of right posterior tibial veins PE seen in initial CT angio done for evaluation of AAA.   DVT of right posterior tibial veins are noted as well. DVT/PE suspected to be due to impaired mobility for 2 weeks after a fall.  Patient was ambulatory prior to that. As mentioned above, anticoagulation attempt failed because of drop in hemoglobin.  Vascular surgery consulted for IVC with replacement.   Acute to subacute L1 compression fracture Likely secondary to fall 2 weeks prior to presentation MRI 4/26 showed acute to subacute L1 compression fracture with up to 70% vertebral body height loss and 3 mm retropulsion of the posterior cortex.  No spinal canal stenosis or neural foraminal narrowing. 5/1, underwent L1 balloon kyphoplasty by IR.  Biopsy negative for malignancy.. Currently on pain management with PRN Tylenol and oxycodone.  Large infrarenal AAA with  mural thrombus Vascular surgery evaluated the patient To follow-up in office in 6 months for repeat ultrasound.    Left psoas hematoma 4/25, patient complained of severe left thigh pain. MRI lumbar spine 4/26 also showed left psoas muscle hematoma. Looks to be self-contained. Unclear if it was the primary etiology of pain. No longer having any pain. Hemodynamically stable.  Continue to monitor.     Essential hypertension Blood pressure stable with amlodipine, losartan   Prediabetes Hemoglobin A1c on 12/08/2022 was 5.8 Continue diet and lifestyle modification at this time   Mixed hyperlipidemia Continue Lipitor   GERD Continue Protonix   Dementia Continue Aricept, Namenda  Severe protein calorie malnutrition Declining oral intake for last year. Nutrition consult obtained. Started on supplements  Constipation Continue scheduled and as needed bowel regimen   Impaired mobility Previously ambulatory but since since fall 2 weeks prior, he has been less ambulatory.  Has also suffered physical deconditioning.  PT eval obtained.  SNF recommended.  Goals of care   Code Status: DNR     DVT prophylaxis:  SCDs Start: 02/21/23 1945   Antimicrobials: None Fluid: None currently Consultants: IR, GI, vascular surgery Family Communication: Patient's wife at bedside. Status: Inpatient Level of care:  Med-Surg   Patient from: Home Anticipated d/c to: SNF Needs to continue in-hospital care:  IVC filter today.  Hopefully discharge tomorrow  Diet:  Diet Order             Diet NPO time specified  Diet effective midnight                   Scheduled Meds:  sodium chloride   Intravenous Once   amLODipine  5 mg Oral Daily   atorvastatin  40 mg Oral Daily   cyanocobalamin  2,000 mcg Oral Daily   diphenhydrAMINE  25 mg Oral Once   docusate sodium  100 mg Oral BID   donepezil  10 mg Oral QHS   feeding supplement  237 mL Oral BID BM   ferrous sulfate  325 mg Oral Q  breakfast   insulin aspart  0-9 Units Subcutaneous TID WC   lidocaine  1 patch Transdermal Q24H  losartan  100 mg Oral Daily   magic mouthwash  5 mL Oral TID   megestrol  20 mg Oral BID   memantine  10 mg Oral BID   multivitamin with minerals  1 tablet Oral Daily   pantoprazole  40 mg Oral Daily    PRN meds: acetaminophen **OR** acetaminophen, hydrALAZINE, iohexol, ondansetron **OR** ondansetron (ZOFRAN) IV, oxyCODONE   Infusions:      Antimicrobials: Anti-infectives (From admission, onward)    Start     Dose/Rate Route Frequency Ordered Stop   03/02/23 1050  ceFAZolin (ANCEF) IVPB 2g/100 mL premix        over 30 Minutes Intravenous Continuous PRN 03/02/23 1058 03/02/23 1050   03/02/23 0600  ceFAZolin (ANCEF) IVPB 2g/100 mL premix       Note to Pharmacy: Do not send to floor, will be given in Radiology dept prior to procedure.   2 g 200 mL/hr over 30 Minutes Intravenous On call 03/01/23 1039 03/03/23 0600       Nutritional status:  Body mass index is 21.37 kg/m.  Nutrition Problem: Severe Malnutrition Etiology: chronic illness Signs/Symptoms: severe fat depletion, moderate fat depletion, severe muscle depletion, energy intake < or equal to 75% for > or equal to 1 month     Objective: Vitals:   03/09/23 0530 03/09/23 0823  BP: 128/76 133/67  Pulse: 94 74  Resp: 16 17  Temp: 98.1 F (36.7 C) 98 F (36.7 C)  SpO2: 98% 97%    Intake/Output Summary (Last 24 hours) at 03/09/2023 1109 Last data filed at 03/09/2023 0930 Gross per 24 hour  Intake 440 ml  Output 1450 ml  Net -1010 ml    Filed Weights   02/21/23 1159 02/28/23 0438 03/07/23 0813  Weight: 64.9 kg 61.9 kg 61.9 kg   Weight change:  Body mass index is 21.37 kg/m.   Physical Exam: General exam: Pleasant, elderly Caucasian male. Not in distress Skin: No rashes, lesions or ulcers. HEENT: Atraumatic, normocephalic, no obvious bleeding Lungs: Clear to auscultation bilaterally CVS: Regular rate and  rhythm, no murmur GI/Abd soft, nontender, nondistended, bowel sound present CNS: Alert, awake, oriented x 3 Psychiatry: Mood appropriate Extremities: No pedal edema, no calf tenderness  Data Review: I have personally reviewed the laboratory data and studies available.  F/u labs ordered Unresulted Labs (From admission, onward)     Start     Ordered   03/10/23 0500  CBC with Differential/Platelet  Tomorrow morning,   R        03/09/23 0840   03/10/23 0500  Basic metabolic panel  Tomorrow morning,   R        03/09/23 0840   03/10/23 0500  Type and screen  Once,   R        03/09/23 0840   03/06/23 0500  CBC  Daily,   R     Comments: Call MD for hemoglobin less than 7   See Hyperspace for full Linked Orders Report.   03/05/23 1400   03/05/23 0500  CBC  Daily,   R      03/04/23 1355            Total time spent in review of labs and imaging, patient evaluation, formulation of plan, documentation and communication with family: 45 minutes  Signed, Lorin Glass, MD [facility] 03/09/2023

## 2023-03-09 NOTE — Plan of Care (Signed)

## 2023-03-09 NOTE — Progress Notes (Signed)
  Daily Progress Note   Subjective: No complaints  Objective: Vitals:   03/09/23 0823 03/09/23 1325  BP: 133/67   Pulse: 74   Resp: 17   Temp: 98 F (36.7 C)   SpO2: 97% 100%    Physical Examination Frail, cachectic Normal rate Nonlabored breathing Moving all 4 extremities Pleasant, alert, oriented  ASSESSMENT/PLAN:  Patient is a 77 year old male unable to tolerate anticoagulation.  He is a candidate for IVC filter placement.  After discussing risk and benefits with both he and his wife, Walter [name] elected to proceed.   Fara Olden MD MS [facility] [phone] 03/09/2023  2:01 PM

## 2023-03-10 ENCOUNTER — Encounter (HOSPITAL_COMMUNITY): Payer: Self-pay | Admitting: Internal Medicine

## 2023-03-10 DIAGNOSIS — Z743 Need for continuous supervision: Secondary | ICD-10-CM | POA: Diagnosis not present

## 2023-03-10 DIAGNOSIS — Z7401 Bed confinement status: Secondary | ICD-10-CM | POA: Diagnosis not present

## 2023-03-10 DIAGNOSIS — D649 Anemia, unspecified: Secondary | ICD-10-CM | POA: Diagnosis not present

## 2023-03-10 DIAGNOSIS — R5381 Other malaise: Secondary | ICD-10-CM | POA: Diagnosis not present

## 2023-03-10 DIAGNOSIS — K222 Esophageal obstruction: Secondary | ICD-10-CM | POA: Diagnosis not present

## 2023-03-10 DIAGNOSIS — R279 Unspecified lack of coordination: Secondary | ICD-10-CM | POA: Diagnosis not present

## 2023-03-10 DIAGNOSIS — I739 Peripheral vascular disease, unspecified: Secondary | ICD-10-CM | POA: Diagnosis not present

## 2023-03-10 DIAGNOSIS — R1084 Generalized abdominal pain: Secondary | ICD-10-CM | POA: Diagnosis not present

## 2023-03-10 DIAGNOSIS — I2510 Atherosclerotic heart disease of native coronary artery without angina pectoris: Secondary | ICD-10-CM | POA: Diagnosis not present

## 2023-03-10 DIAGNOSIS — G20A1 Parkinson's disease without dyskinesia, without mention of fluctuations: Secondary | ICD-10-CM | POA: Diagnosis not present

## 2023-03-10 DIAGNOSIS — S32010D Wedge compression fracture of first lumbar vertebra, subsequent encounter for fracture with routine healing: Secondary | ICD-10-CM | POA: Diagnosis not present

## 2023-03-10 DIAGNOSIS — G3184 Mild cognitive impairment, so stated: Secondary | ICD-10-CM | POA: Diagnosis not present

## 2023-03-10 DIAGNOSIS — E8809 Other disorders of plasma-protein metabolism, not elsewhere classified: Secondary | ICD-10-CM | POA: Diagnosis not present

## 2023-03-10 DIAGNOSIS — R4181 Age-related cognitive decline: Secondary | ICD-10-CM | POA: Diagnosis not present

## 2023-03-10 DIAGNOSIS — F0390 Unspecified dementia without behavioral disturbance: Secondary | ICD-10-CM | POA: Diagnosis not present

## 2023-03-10 DIAGNOSIS — E782 Mixed hyperlipidemia: Secondary | ICD-10-CM | POA: Diagnosis not present

## 2023-03-10 DIAGNOSIS — R531 Weakness: Secondary | ICD-10-CM | POA: Diagnosis not present

## 2023-03-10 DIAGNOSIS — E1151 Type 2 diabetes mellitus with diabetic peripheral angiopathy without gangrene: Secondary | ICD-10-CM | POA: Diagnosis not present

## 2023-03-10 DIAGNOSIS — K219 Gastro-esophageal reflux disease without esophagitis: Secondary | ICD-10-CM | POA: Diagnosis not present

## 2023-03-10 DIAGNOSIS — E119 Type 2 diabetes mellitus without complications: Secondary | ICD-10-CM | POA: Diagnosis not present

## 2023-03-10 DIAGNOSIS — D500 Iron deficiency anemia secondary to blood loss (chronic): Secondary | ICD-10-CM | POA: Diagnosis not present

## 2023-03-10 DIAGNOSIS — I714 Abdominal aortic aneurysm, without rupture, unspecified: Secondary | ICD-10-CM | POA: Diagnosis not present

## 2023-03-10 DIAGNOSIS — I8290 Acute embolism and thrombosis of unspecified vein: Secondary | ICD-10-CM | POA: Diagnosis not present

## 2023-03-10 DIAGNOSIS — I10 Essential (primary) hypertension: Secondary | ICD-10-CM | POA: Diagnosis not present

## 2023-03-10 DIAGNOSIS — R296 Repeated falls: Secondary | ICD-10-CM | POA: Diagnosis not present

## 2023-03-10 DIAGNOSIS — E559 Vitamin D deficiency, unspecified: Secondary | ICD-10-CM | POA: Diagnosis not present

## 2023-03-10 DIAGNOSIS — M6281 Muscle weakness (generalized): Secondary | ICD-10-CM | POA: Diagnosis not present

## 2023-03-10 DIAGNOSIS — I2699 Other pulmonary embolism without acute cor pulmonale: Secondary | ICD-10-CM | POA: Diagnosis not present

## 2023-03-10 DIAGNOSIS — E43 Unspecified severe protein-calorie malnutrition: Secondary | ICD-10-CM | POA: Diagnosis not present

## 2023-03-10 LAB — CBC WITH DIFFERENTIAL/PLATELET
Abs Immature Granulocytes: 0.05 10*3/uL (ref 0.00–0.07)
Basophils Absolute: 0 10*3/uL (ref 0.0–0.1)
Basophils Relative: 0 %
Eosinophils Absolute: 0.1 10*3/uL (ref 0.0–0.5)
Eosinophils Relative: 2 %
HCT: 26.6 % — ABNORMAL LOW (ref 39.0–52.0)
Hemoglobin: 7.9 g/dL — ABNORMAL LOW (ref 13.0–17.0)
Immature Granulocytes: 1 %
Lymphocytes Relative: 13 %
Lymphs Abs: 0.8 10*3/uL (ref 0.7–4.0)
MCH: 25.5 pg — ABNORMAL LOW (ref 26.0–34.0)
MCHC: 29.7 g/dL — ABNORMAL LOW (ref 30.0–36.0)
MCV: 85.8 fL (ref 80.0–100.0)
Monocytes Absolute: 0.4 10*3/uL (ref 0.1–1.0)
Monocytes Relative: 7 %
Neutro Abs: 4.7 10*3/uL (ref 1.7–7.7)
Neutrophils Relative %: 77 %
Platelets: 206 10*3/uL (ref 150–400)
RBC: 3.1 MIL/uL — ABNORMAL LOW (ref 4.22–5.81)
RDW: 20.5 % — ABNORMAL HIGH (ref 11.5–15.5)
WBC: 6.1 10*3/uL (ref 4.0–10.5)
nRBC: 0 % (ref 0.0–0.2)

## 2023-03-10 LAB — GLUCOSE, CAPILLARY
Glucose-Capillary: 133 mg/dL — ABNORMAL HIGH (ref 70–99)
Glucose-Capillary: 98 mg/dL (ref 70–99)
Glucose-Capillary: 96 mg/dL (ref 70–99)

## 2023-03-10 LAB — BASIC METABOLIC PANEL
Anion gap: 7 (ref 5–15)
BUN: 23 mg/dL (ref 8–23)
CO2: 19 mmol/L — ABNORMAL LOW (ref 22–32)
Calcium: 7.9 mg/dL — ABNORMAL LOW (ref 8.9–10.3)
Chloride: 110 mmol/L (ref 98–111)
Creatinine, Ser: 1.05 mg/dL (ref 0.61–1.24)
GFR, Estimated: >60 mL/min (ref >60)
Glucose, Bld: 71 mg/dL (ref 70–99)
Potassium: 3.5 mmol/L (ref 3.5–5.1)
Sodium: 136 mmol/L (ref 135–145)

## 2023-03-10 LAB — TYPE AND SCREEN
ABO/RH(D): O POS
Antibody Screen: NEGATIVE

## 2023-03-10 MED ORDER — DIPHENHYDRAMINE HCL 50 MG PO TABS: 25.0000 mg | ORAL_TABLET | Freq: Three times a day (TID) | ORAL | Status: DC | PRN

## 2023-03-10 MED ORDER — HYDROCERIN EX CREA
TOPICAL_CREAM | Freq: Two times a day (BID) | CUTANEOUS | Status: DC
  Filled 2023-03-10: qty 113

## 2023-03-10 MED ORDER — HYDROCERIN EX CREA: 1.0000 | TOPICAL_CREAM | Freq: Two times a day (BID) | CUTANEOUS | 0 refills | Status: AC

## 2023-03-10 NOTE — Discharge Summary (Signed)
Physician Discharge Summary  Walter [name] ZOX:[id number] DOB: [birth year] DOA: 02/21/2023  PCP: Kristian Covey, MD  Admit date: 02/21/2023 Discharge date: 03/10/2023  Admitted From: Home Discharge disposition: SNF  Recommendations at discharge:  Currently on pain management with PRN Tylenol and oxycodone. You seem to have Sweat rash on your back.  Eucerin cream started.  Okay to take low-dose Benadryl as needed   Brief narrative: Walter [name] is a 77 y.o. male with PMH significant for DM2, HTN, HLD, CAD, PVD, dementia, GERD, diverticulosis. 4/17, patient had a lumbar spine x-ray done for 2 weeks of lower back pain after a fall.  X-ray showed L1 compression fracture but also showed 5 cm distal AAA. 4/22, patient was sent to the ED at [facility] for evaluation of AAA. Ultrasound aorta showed 6 cm infrarenal AAA with large amount of mural thrombus. 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 Images that extended into the lower chest noted small subsegmental incidental pulmonary embolus in right lower lobe.   Vascular surgery was consulted Patient was started on IV heparin Patient was admitted to [facility] at [facility] Ultrasound duplex of lower extremities showed acute DVT of right posterior tibial veins.   Subjective: Patient was seen and examined this morning.   Underwent IVC filter placement yesterday.  Labs this morning show stable hemoglobin and creatinine. Lying on bed.  Not in distress.  Complains of itchy rash at the back.  Hospital course: Subsegmental pulmonary embolism Acute DVT of right posterior tibial veins PE seen in initial CT angio done for evaluation of AAA.   DVT of right posterior tibial veins are noted as well. DVT/PE suspected to be due to impaired mobility for 2 weeks after a fall.  Patient was ambulatory prior to that. Patient was initially started on IV  heparin drip and subsequently switched to Xarelto after which hemoglobin dropped.  EGD/colonoscopy done on 5/6. EGD showed a single angiectasia in the duodenum which was treated with APC.  Colonoscopy showed a cecal polyp, and ascending colon polyp and severe diverticulosis from ascending colon to sigmoid colon.  Anticoagulation with Eliquis was resumed after GI clearance.  However hemoglobin dropped again. Vascular surgery was consulted.  Given intolerance to anticoagulation, patient underwent IVC filter placement on 5/8.  Unable to continue anticoagulation because of recurrent GI bleeding.  Acute on chronic anemia Unclear baseline hemoglobin as the most recent blood work prior to this hospitalization was from 2020 with hemoglobin more than 12.  However ferritin level is low suggestive of chronic slow bleeding. EGD/colonoscopy as above. Off anticoagulation.  Hemoglobin stabilized as below Recent Labs    12/08/22 1118 02/16/23 1059 02/23/23 0625 02/23/23 0626 02/23/23 0627 02/23/23 1539 02/24/23 0709 03/06/23 0703 03/07/23 0346 03/08/23 0021 03/09/23 0051 03/10/23 0135  HGB  --    < >  --   --  8.4*  --    < > 8.6* 8.4* 7.7* 7.5* 7.9*  MCV  --    < >  --   --  77.4*  --    < > 83.6 83.4 83.9 84.5 85.8  VITAMINB12 320  --   --   --   --  678  --   --   --   --   --   --   FOLATE  --   --  13.2  --   --   --   --   --   --   --   --   --  FERRITIN  --   --   --   --  23*  --   --   --   --   --   --   --   TIBC  --   --   --   --  377  --   --   --   --   --   --   --   IRON  --   --   --   --  16*  --   --   --   --   --   --   --   RETICCTPCT  --   --   --  2.0  --   --   --   --   --   --   --   --    < > = values in this interval not displayed.    Acute to subacute L1 compression fracture Likely secondary to fall 2 weeks prior to presentation MRI 4/26 showed acute to subacute L1 compression fracture with up to 70% vertebral body height loss and 3 mm retropulsion of the posterior  cortex.  No spinal canal stenosis or neural foraminal narrowing. 5/1, underwent L1 balloon kyphoplasty by IR.  Biopsy negative for malignancy.. Currently on pain management with PRN Tylenol and oxycodone.  Large infrarenal AAA with mural thrombus Vascular surgery evaluated the patient To follow-up in office in 6 months for repeat ultrasound.    Left psoas hematoma 4/25, patient complained of severe left thigh pain. MRI lumbar spine 4/26 also showed left psoas muscle hematoma. Looks to be self-contained. Unclear if it was the primary etiology of pain. No longer having any pain. Hemodynamically stable.  Continue to monitor.    Pruritic rashes Complains of itchy back.  On exam, he has areas of red pruritic papules which look like sweat rash Start Eucerin cream.  Okay to use Benadryl as needed.   Essential hypertension Blood pressure stable with amlodipine, losartan   Prediabetes Hemoglobin A1c on 12/08/2022 was 5.8 Continue diet and lifestyle modification at this time   Mixed hyperlipidemia Continue Lipitor   GERD Continue Protonix   Dementia Continue Aricept, Namenda  Severe protein calorie malnutrition Declining oral intake for last year. Nutrition consult obtained. Started on supplements  Constipation Continue scheduled and as needed bowel regimen   Impaired mobility Previously ambulatory but since since fall 2 weeks prior, he has been less ambulatory.  Has also suffered physical deconditioning.  PT eval obtained.  SNF recommended.  Goals of care   Code Status: DNR   Wounds:  -    Discharge Exam:   Vitals:   03/09/23 2348 03/10/23 0327 03/10/23 0735 03/10/23 1156  BP: 138/74 (!) 149/70 (!) 148/69 (!) 150/68  Pulse: 73 69 83 75  Resp: 12 10 16 17   Temp: 98.3 F (36.8 C)  98.6 F (37 C) 98.8 F (37.1 C)  TempSrc: Oral  Oral Oral  SpO2: 99% 96% 98% 99%  Weight:      Height:        Body mass index is 21.37 kg/m.  General exam: Pleasant, elderly  Caucasian male. Not in distress Skin: No rashes, lesions or ulcers. HEENT: Atraumatic, normocephalic, no obvious bleeding Lungs: Clear to auscultation bilaterally CVS: Regular rate and rhythm, no murmur GI/Abd soft, nontender, nondistended, bowel sound present Back: Multiple red papular lesions, pruritic CNS: Alert, awake, oriented x 3 Psychiatry: Mood appropriate Extremities: No pedal edema, no calf tenderness  Follow  ups:    Contact information for follow-up providers     Kristian Covey, MD Follow up.   Specialty: Family Medicine Contact information: [address] [place] [address] [phone]              Contact information for after-discharge care     Destination     [facility] .   Service: Skilled Nursing Contact information: [address] [place] [address] [phone]                     Discharge Instructions:   Discharge Instructions     Call MD for:  difficulty breathing, headache or visual disturbances   Complete by: As directed    Call MD for:  extreme fatigue   Complete by: As directed    Call MD for:  hives   Complete by: As directed    Call MD for:  persistant dizziness or light-headedness   Complete by: As directed    Call MD for:  persistant nausea and vomiting   Complete by: As directed    Call MD for:  severe uncontrolled pain   Complete by: As directed    Call MD for:  temperature >100.4   Complete by: As directed    Diet general   Complete by: As directed    Discharge instructions   Complete by: As directed    Recommendations at discharge:   Currently on pain management with PRN Tylenol and oxycodone.  You seem to have Sweat rash on your back.  Eucerin cream started.  Okay to take low-dose Benadryl as needed  General discharge instructions: Follow with Primary MD Kristian Covey, MD in 7 days  Please request your PCP  to go over your hospital tests,  procedures, radiology results at the follow up. Please get your medicines reviewed and adjusted.  Your PCP may decide to repeat certain labs or tests as needed. Do not drive, operate heavy machinery, perform activities at heights, swimming or participation in water activities or provide baby sitting services if your were admitted for syncope or siezures until you have seen by Primary MD or a Neurologist and advised to do so again. [place] Controlled Substance Reporting System database was reviewed. Do not drive, operate heavy machinery, perform activities at heights, swim, participate in water activities or provide baby-sitting services while on medications for pain, sleep and mood until your outpatient physician has reevaluated you and advised to do so again.  You are strongly recommended to comply with the dose, frequency and duration of prescribed medications. Activity: As tolerated with Full fall precautions use walker/cane & assistance as needed Avoid using any recreational substances like cigarette, tobacco, alcohol, or non-prescribed drug. If you experience worsening of your admission symptoms, develop shortness of breath, life threatening emergency, suicidal or homicidal thoughts you must seek medical attention immediately by calling 911 or calling your MD immediately  if symptoms less severe. You must read complete instructions/literature along with all the possible adverse reactions/side effects for all the medicines you take and that have been prescribed to you. Take any new medicine only after you have completely understood and accepted all the possible adverse reactions/side effects.  Wear Seat belts while driving. You were cared for by a hospitalist during your hospital stay. If you have any questions about your discharge medications or the care you received while you were in the hospital after you are discharged, you can call the  unit and ask to speak with the hospitalist or the  covering physician. Once you are discharged, your primary care physician will handle any further medical issues. Please note that NO REFILLS for any discharge medications will be authorized once you are discharged, as it is imperative that you return to your primary care physician (or establish a relationship with a primary care physician if you do not have one).   Increase activity slowly   Complete by: As directed        Discharge Medications:   Allergies as of 03/10/2023   No Known Allergies      Medication List     STOP taking these medications    metFORMIN 1000 MG tablet Commonly known as: GLUCOPHAGE   NEXIUM PO Replaced by: pantoprazole 40 MG tablet   tiZANidine 4 MG tablet Commonly known as: Zanaflex       TAKE these medications    amLODipine 5 MG tablet Commonly known as: NORVASC Take 1 tablet by mouth once daily   atorvastatin 40 MG tablet Commonly known as: LIPITOR Take 1 tablet by mouth once daily   cyanocobalamin 2000 MCG tablet Take 1 tablet (2,000 mcg total) by mouth daily.   diphenhydrAMINE 50 MG tablet Commonly known as: BENADRYL Take 0.5 tablets (25 mg total) by mouth every 8 (eight) hours as needed for itching.   docusate sodium 100 MG capsule Commonly known as: COLACE Take 1 capsule (100 mg total) by mouth 2 (two) times daily.   donepezil 10 MG tablet Commonly known as: ARICEPT Take 1 tablet daily What changed:  how much to take how to take this when to take this additional instructions   feeding supplement Liqd Take 237 mLs by mouth 2 (two) times daily between meals.   hydrocerin Crea Apply 1 Application topically 2 (two) times daily for 14 days.   Iron (Ferrous Sulfate) 325 (65 Fe) MG Tabs Take 325 mg by mouth daily.   lidocaine 4 % Place 1 patch onto the skin daily as needed (pain).   losartan 100 MG tablet Commonly known as: COZAAR Take 1 tablet by mouth once daily   memantine 10 MG tablet Commonly known as:  NAMENDA Take 1 tablet twice a day What changed:  how much to take how to take this when to take this additional instructions   multivitamin with minerals Tabs tablet Take 1 tablet by mouth daily.   ONE TOUCH ULTRA 2 w/Device Kit 1 kit by Does not apply route daily.   OneTouch Ultra test strip Generic drug: glucose blood USE 1 STRIP TO CHECK GLUCOSE ONCE DAILY   onetouch ultrasoft lancets Check blood sugars once daily. Dx: E11.51   OVER THE COUNTER MEDICATION Take 1 tablet by mouth daily. Allergy relief   oxyCODONE 5 MG immediate release tablet Commonly known as: Oxy IR/ROXICODONE Take 1 tablet (5 mg total) by mouth every 6 (six) hours as needed for up to 5 days for severe pain.   pantoprazole 40 MG tablet Commonly known as: PROTONIX Take 1 tablet (40 mg total) by mouth daily. Replaces: NEXIUM PO   ROLAIDS PO Take 1 tablet by mouth as needed (heartburn).         The results of significant diagnostics from this hospitalization (including imaging, microbiology, ancillary and laboratory) are listed below for reference.    Procedures and Diagnostic Studies:   CT Angio Abd/Pel W and/or Wo Contrast  Addendum Date: 02/21/2023   ADDENDUM REPORT: 02/21/2023 17:45 ADDENDUM: Within the images through the  lower chest, there is a small subsegmental incidental pulmonary embolus noted in a right lower lobe anterolateral pulmonary arterial branch. Clinical significance is questionable due to its small size. These results were called by telephone at the time of interpretation on 02/21/2023 at 5:42 pm to provider Dr. Estell Harpin, who verbally acknowledged these results. Electronically Signed   By: Charlett Nose M.D.   On: 02/21/2023 17:45   Result Date: 02/21/2023 CLINICAL DATA:  back pain, aortic aneurysm, lumbar spine compression fracture. Fall. AAA seen on ultrasound. EXAM: CTA ABDOMEN AND PELVIS WITHOUT AND WITH CONTRAST TECHNIQUE: Multidetector CT imaging of the abdomen and pelvis was  performed using the standard protocol during bolus administration of intravenous contrast. Multiplanar reconstructed images and MIPs were obtained and reviewed to evaluate the vascular anatomy. RADIATION DOSE REDUCTION: This exam was performed according to the departmental dose-optimization program which includes automated exposure control, adjustment of the mA and/or kV according to patient size and/or use of iterative reconstruction technique. CONTRAST:  80mL OMNIPAQUE IOHEXOL 350 MG/ML SOLN COMPARISON:  Ultrasound performed today. FINDINGS: VASCULAR Aorta: Heavily calcified aorta. Large infrarenal abdominal aortic aneurysm measuring up to 5.9 cm in maximum diameter. Extensive mural plaque. Aneurysm terminates at the aortic bifurcation. No dissection. Celiac: Heavily calcified. No aneurysm or dissection. No significant stenosis. SMA: Moderate calcifications. No aneurysm or dissection or stenosis. Renals: Moderate calcifications. No aneurysm, dissection or stenosis. IMA: Patent.  No aneurysm, dissection or stenosis. Inflow: Heavily calcified. No aneurysm or dissection. 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 Proximal Outflow: Right common femoral artery occluded. There may be some reconstitution of the proximal superficial femoral and deep femoral artery. Heavily calcified common femoral arteries bilaterally. Left common femoral, proximal superficial femoral and deep femoral arteries are patent. Veins: No obvious venous abnormality within the limitations of this arterial phase study. Review of the MIP images confirms the above findings. NON-VASCULAR Lower chest: Linear areas of scarring.  No acute findings. Hepatobiliary: No focal hepatic abnormality. Gallbladder unremarkable. Pancreas: No focal abnormality or ductal dilatation. Spleen: No focal abnormality.  Normal size. Adrenals/Urinary Tract: 1.9 cm cyst in the midpole of the right kidney appears benign. No follow-up imaging recommended. No  hydronephrosis. Adrenal glands and urinary bladder unremarkable. Stomach/Bowel: Diffuse colonic diverticulosis. No active diverticulitis. Stomach and small bowel decompressed, unremarkable. Lymphatic: No adenopathy Reproductive: No visible focal abnormality. Other: No free fluid or free air. Small bilateral inguinal hernias containing fat. Musculoskeletal: Moderate L1 compression fracture as seen on plain films. IMPRESSION: VASCULAR 5.9 cm infrarenal abdominal aortic aneurysm with extensive mural plaque. Aneurysm terminates at the aortic bifurcation. 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 There appears to be reconstitution of the right superficial femoral and deep femoral arteries proximally. Diffuse aortoiliac and branch vessel atherosclerosis. NON-VASCULAR Colonic diverticulosis. No acute findings Age-indeterminate moderate L1 compression fracture Electronically Signed: By: Charlett Nose M.D. On: 02/21/2023 17:09   US Aorta  Result Date: 02/21/2023 CLINICAL DATA:  Abdominal aortic aneurysm noted on radiograph. EXAM: ULTRASOUND OF ABDOMINAL AORTA TECHNIQUE: Ultrasound examination of the abdominal aorta and proximal common iliac arteries was performed to evaluate for aneurysm. Additional color and Doppler images of the distal aorta were obtained to document patency. COMPARISON:  February 16, 2023. FINDINGS: Abdominal aortic measurements as follows: Proximal:  1.9 cm Mid:  1.8 cm Distal:  6.0 cm Patent: Yes, peak systolic velocity is 71 cm/s Right common iliac artery: 2.1 cm Left common iliac artery: 2.1 cm IMPRESSION: 6 cm infrarenal abdominal aortic  aneurysm is noted with a large amount of mural thrombus. Dilatation of common iliac arteries is noted bilaterally. Recommend referral to a vascular specialist. This recommendation follows ACR consensus guidelines: White Paper of the ACR Incidental Findings Committee II on Vascular Findings. J Am Coll Radiol 2013; 10:789-794.  Electronically Signed   By: Lupita Raider M.D.   On: 02/21/2023 13:22     Labs:   Basic Metabolic Panel: Recent Labs  Lab 03/05/23 0041 03/10/23 0135  NA 136 136  K 4.4 3.5  CL 106 110  CO2 23 19*  GLUCOSE 120* 71  BUN 30* 23  CREATININE 1.19 1.05  CALCIUM 8.5* 7.9*   GFR Estimated Creatinine Clearance: 51.6 mL/min (by C-G formula based on SCr of 1.05 mg/dL). Liver Function Tests: No results for input(s): "AST", "ALT", "ALKPHOS", "BILITOT", "PROT", "ALBUMIN" in the last 168 hours. No results for input(s): "LIPASE", "AMYLASE" in the last 168 hours. No results for input(s): "AMMONIA" in the last 168 hours. Coagulation profile No results for input(s): "INR", "PROTIME" in the last 168 hours.  CBC: Recent Labs  Lab 03/06/23 0703 03/07/23 0346 03/08/23 0021 03/09/23 0051 03/10/23 0135  WBC 6.5 6.8 7.2 6.4 6.1  NEUTROABS  --   --   --   --  4.7  HGB 8.6* 8.4* 7.7* 7.5* 7.9*  HCT 27.5* 27.2* 25.1* 23.9* 26.6*  MCV 83.6 83.4 83.9 84.5 85.8  PLT 269 263 237 213 206   Cardiac Enzymes: No results for input(s): "CKTOTAL", "CKMB", "CKMBINDEX", "TROPONINI" in the last 168 hours. BNP: Invalid input(s): "POCBNP" CBG: Recent Labs  Lab 03/09/23 1204 03/09/23 1624 03/09/23 2106 03/10/23 0612 03/10/23 1152  GLUCAP 87 133* 234* 98 96   D-Dimer No results for input(s): "DDIMER" in the last 72 hours. Hgb A1c No results for input(s): "HGBA1C" in the last 72 hours. Lipid Profile No results for input(s): "CHOL", "HDL", "LDLCALC", "TRIG", "CHOLHDL", "LDLDIRECT" in the last 72 hours. Thyroid function studies No results for input(s): "TSH", "T4TOTAL", "T3FREE", "THYROIDAB" in the last 72 hours.  Invalid input(s): "FREET3" Anemia work up No results for input(s): "VITAMINB12", "FOLATE", "FERRITIN", "TIBC", "IRON", "RETICCTPCT" in the last 72 hours. Microbiology No results found for this or any previous visit (from the past 240 hour(s)).  Time coordinating discharge: 45  minutes  Signed: Romain Erion  [facility] 03/10/2023, 12:49 PM

## 2023-03-10 NOTE — Progress Notes (Signed)
Occupational Therapy Treatment Patient Details Name: Walter [name] MRN: [id number] DOB: 06/27/1946 Today's Date: 03/10/2023   History of present illness 77 y.o. male presenting 4/22 with back pain, work up revealed AAA aneurysm. Hospital course complicated by finding of acute PE and was started on heparin. X-ray showed an age-indeterminate L1 compression fracture. S/P kyphoplasty 03/02/23. He has also not been able to tolerate most foods for several months. PMH: CAD, DMII, esophageal stricture, GERD, HTN, DDD, HLD, and dementia.   OT comments  Mr. Tise was seen today to work on some IADLs. He is moving well and remains at a S level from his RW due to potential for falls due to his shuffling gait and at times putting his RW off to the side instead of keeping it in front of him for such as standing at the sink. He will continue to benefit from acute OT with follow up continued inpatient follow up therapy, <3 hours/day.    Recommendations for follow up therapy are one component of a multi-disciplinary discharge planning process, led by the attending physician.  Recommendations may be updated based on patient status, additional functional criteria and insurance authorization.    Assistance Recommended at Discharge Frequent or constant Supervision/Assistance  Patient can return home with the following  A little help with walking and/or transfers;A little help with bathing/dressing/bathroom;Assistance with cooking/housework;Help with stairs or ramp for entrance;Assist for transportation;Direct supervision/assist for financial management;Direct supervision/assist for medications management   Equipment Recommendations  Other (comment) (TBD next venue)       Precautions / Restrictions Precautions Precautions: Fall Precaution Comments: Parkinson's gait Restrictions Weight Bearing Restrictions: No       Mobility Bed Mobility               General bed mobility comments: pt up in  bathroom on toilet when I arrived    Transfers Overall transfer level: Needs assistance Equipment used: Rolling walker (2 wheels) Transfers: Sit to/from Stand Sit to Stand: Supervision                 Balance Overall balance assessment: Mild deficits observed, not formally tested                                         ADL either performed or assessed with clinical judgement   ADL Overall ADL's : Needs assistance/impaired     Grooming: Supervision/safety;Standing;Wash/dry hands Grooming Details (indicate cue type and reason): at sink                 Toilet Transfer: Supervision/safety;Ambulation;Rolling walker (2 wheels);Comfort height toilet;Grab bars   Toileting- Clothing Manipulation and Hygiene: Supervision/safety;Sit to/from stand              Extremity/Trunk Assessment Upper Extremity Assessment Upper Extremity Assessment: Overall WFL for tasks assessed            Vision Patient Visual Report: No change from baseline            Cognition Arousal/Alertness: Awake/alert Behavior During Therapy: WFL for tasks assessed/performed Overall Cognitive Status: History of cognitive impairments - at baseline                                 General Comments: When I arrived he was in the bathroom. I asked him to stay seated until I went  to get linens for recliner and he said okay. I came back and he was standing up in bathroom with RW doing peri hygiene.                   Pertinent Vitals/ Pain       Pain Assessment Pain Assessment: No/denies pain         Frequency  Min 1X/week        Progress Toward Goals  OT Goals(current goals can now be found in the care plan section)  Progress towards OT goals: Not progressing toward goals - comment (remains at a S level at RW level due to shuffling gait and at times puts the walker off to the side)  Acute Rehab OT Goals Patient Stated Goal: to go to rehab then  home OT Goal Formulation: With patient Time For Goal Achievement: 03/10/23 Potential to Achieve Goals: Good  Plan Discharge plan remains appropriate       AM-PAC OT "6 Clicks" Daily Activity     Outcome Measure   Help from another person eating meals?: None Help from another person taking care of personal grooming?: A Little Help from another person toileting, which includes using toliet, bedpan, or urinal?: A Little Help from another person bathing (including washing, rinsing, drying)?: A Little Help from another person to put on and taking off regular upper body clothing?: A Little Help from another person to put on and taking off regular lower body clothing?: A Little 6 Click Score: 19    End of Session Equipment Utilized During Treatment: Rolling walker (2 wheels)  OT Visit Diagnosis: Unsteadiness on feet (R26.81);Other abnormalities of gait and mobility (R26.89);Muscle weakness (generalized) (M62.81);History of falling (Z91.81)   Activity Tolerance Patient tolerated treatment well   Patient Left in chair;with call bell/phone within reach;with chair alarm set           Time: 1610-9604 OT Time Calculation (min): 13 min  Charges: OT General Charges $OT Visit: 1 Visit OT Treatments $Self Care/Home Management : 8-22 mins  Lindon Romp OT Acute Rehabilitation Services Office [phone]    Evette Georges 03/10/2023, 2:21 PM

## 2023-03-10 NOTE — Progress Notes (Addendum)
  Progress Note    03/10/2023 8:20 AM 1 Day Post-Op  Subjective:  feels good this morning, no complaints    Vitals:   03/10/23 0327 03/10/23 0735  BP: (!) 149/70 (!) 148/69  Pulse: 69 83  Resp: 10 16  Temp:  98.6 F (37 C)  SpO2: 96% 98%    Physical Exam: General:  resting comfortably Cardiac:  regular Lungs:  nonlabored Incisions:  right groin cath site soft without hematoma Extremities:  BLE warm and well perfused Abdomen:  soft, NT, ND  CBC    Component Value Date/Time   WBC 6.1 03/10/2023 0135   RBC 3.10 (L) 03/10/2023 0135   HGB 7.9 (L) 03/10/2023 0135   HCT 26.6 (L) 03/10/2023 0135   PLT 206 03/10/2023 0135   MCV 85.8 03/10/2023 0135   MCH 25.5 (L) 03/10/2023 0135   MCHC 29.7 (L) 03/10/2023 0135   RDW 20.5 (H) 03/10/2023 0135   LYMPHSABS 0.8 03/10/2023 0135   MONOABS 0.4 03/10/2023 0135   EOSABS 0.1 03/10/2023 0135   BASOSABS 0.0 03/10/2023 0135    BMET    Component Value Date/Time   NA 136 03/10/2023 0135   K 3.5 03/10/2023 0135   CL 110 03/10/2023 0135   CO2 19 (L) 03/10/2023 0135   GLUCOSE 71 03/10/2023 0135   GLUCOSE 127 (H) 08/17/2006 1059   BUN 23 03/10/2023 0135   CREATININE 1.05 03/10/2023 0135   CALCIUM 7.9 (L) 03/10/2023 0135   GFRNONAA >60 03/10/2023 0135   GFRAA >60 01/07/2019 0751    INR    Component Value Date/Time   INR 1.2 03/02/2023 0415     Intake/Output Summary (Last 24 hours) at 03/10/2023 0820 Last data filed at 03/10/2023 0100 Gross per 24 hour  Intake 900 ml  Output 850 ml  Net 50 ml      Assessment/Plan:  77 y.o. male is 1 day post op, s/p: IVC filter placement   -Doing well this morning. Denies any pain. Right groin access site soft without hematoma -Bilateral lower extremities warm and well perfused. Intact motor and sensation -Continue mobilization as tolerated -Hx of AAA, will have outpatient follow up to discuss repair   [name], PA-C [facility] [phone] 03/10/2023 8:20 AM  VASCULAR STAFF ADDENDUM: I have independently interviewed and examined the patient. I agree with the above.  No hematoma. No plan for removal at this time.  Will follow up with Dr. Randie Heinz with known AAA. Should filter need to be removed, can discuss at that time.   Fara Olden, MD [facility] Phone Number: [phone] 03/10/2023 8:25 AM

## 2023-03-10 NOTE — TOC Transition Note (Signed)
Transition of Care [facility]) - CM/SW Discharge Note   Patient Details  Name: Walter [name] MRN: [id number] Date of Birth: April 15, 1946  Transition of Care [facility]) CM/SW Contact:  Erin Sons, LCSW Phone Number: 03/10/2023, 1:37 PM   Clinical Narrative:     Patient will DC to: [facility] Anticipated DC date: 03/10/23 Family notified: Spouse, Rolla Flatten Transport by: Sharin Mons   Per MD patient ready for DC to [facility]. RN, patient, patient's family, and facility notified of DC. Discharge Summary and FL2 sent to facility. RN to call report prior to discharge [phone] Room 504-1). DC packet on chart. Ambulance transport requested for patient.   CSW will sign off for now as social work intervention is no longer needed. Please consult [place] again if new needs arise.   Final next level of care: Skilled Nursing Facility Barriers to Discharge: No Barriers Identified   Patient Goals and CMS Choice CMS Medicare.gov Compare Post Acute Care list provided to:: Patient Choice offered to / list presented to : Patient, Spouse  Discharge Placement                Patient chooses bed at:  [facility]) Patient to be transferred to facility by: [facility] Name of family member notified: Chayim Vanwyhe; spouse Patient and family notified of of transfer: 03/10/23  Discharge Plan and Services Additional resources added to the After Visit Summary for       Post Acute Care Choice: Skilled Nursing Facility                               Social Determinants of Health (SDOH) Interventions SDOH Screenings   Food Insecurity: Patient Declined (02/22/2023)  Housing: Low Risk  (02/22/2023)  Transportation Needs: Patient Declined (02/22/2023)  Utilities: Patient Declined (02/22/2023)  Alcohol Screen: Low Risk  (04/23/2022)  Depression (PHQ2-9): High Risk (02/16/2023)  Financial Resource Strain: Low Risk  (04/23/2022)  Physical Activity: Inactive (04/23/2022)  Social Connections: Socially Integrated  (04/23/2022)  Stress: No Stress Concern Present (04/23/2022)  Tobacco Use: Medium Risk (03/10/2023)     Readmission Risk Interventions     No data to display

## 2023-03-12 ENCOUNTER — Other Ambulatory Visit: Payer: Self-pay | Admitting: Family Medicine

## 2023-03-12 DIAGNOSIS — E785 Hyperlipidemia, unspecified: Secondary | ICD-10-CM

## 2023-03-12 DIAGNOSIS — I10 Essential (primary) hypertension: Secondary | ICD-10-CM

## 2023-03-14 DIAGNOSIS — E119 Type 2 diabetes mellitus without complications: Secondary | ICD-10-CM | POA: Diagnosis not present

## 2023-03-14 DIAGNOSIS — E559 Vitamin D deficiency, unspecified: Secondary | ICD-10-CM | POA: Diagnosis not present

## 2023-03-14 DIAGNOSIS — I10 Essential (primary) hypertension: Secondary | ICD-10-CM | POA: Diagnosis not present

## 2023-03-15 DIAGNOSIS — I8290 Acute embolism and thrombosis of unspecified vein: Secondary | ICD-10-CM | POA: Diagnosis not present

## 2023-03-15 DIAGNOSIS — R5381 Other malaise: Secondary | ICD-10-CM | POA: Diagnosis not present

## 2023-03-15 DIAGNOSIS — I2699 Other pulmonary embolism without acute cor pulmonale: Secondary | ICD-10-CM | POA: Diagnosis not present

## 2023-03-21 DIAGNOSIS — F0280 Dementia in other diseases classified elsewhere without behavioral disturbance: Secondary | ICD-10-CM | POA: Diagnosis not present

## 2023-03-21 DIAGNOSIS — S32010D Wedge compression fracture of first lumbar vertebra, subsequent encounter for fracture with routine healing: Secondary | ICD-10-CM | POA: Diagnosis not present

## 2023-03-21 DIAGNOSIS — I2693 Single subsegmental pulmonary embolism without acute cor pulmonale: Secondary | ICD-10-CM | POA: Diagnosis not present

## 2023-03-21 DIAGNOSIS — I2510 Atherosclerotic heart disease of native coronary artery without angina pectoris: Secondary | ICD-10-CM | POA: Diagnosis not present

## 2023-03-21 DIAGNOSIS — D649 Anemia, unspecified: Secondary | ICD-10-CM | POA: Diagnosis not present

## 2023-03-21 DIAGNOSIS — G20A1 Parkinson's disease without dyskinesia, without mention of fluctuations: Secondary | ICD-10-CM | POA: Diagnosis not present

## 2023-03-21 DIAGNOSIS — Z5982 Transportation insecurity: Secondary | ICD-10-CM | POA: Diagnosis not present

## 2023-03-21 DIAGNOSIS — I7143 Infrarenal abdominal aortic aneurysm, without rupture: Secondary | ICD-10-CM | POA: Diagnosis not present

## 2023-03-21 DIAGNOSIS — I82441 Acute embolism and thrombosis of right tibial vein: Secondary | ICD-10-CM | POA: Diagnosis not present

## 2023-03-21 DIAGNOSIS — I10 Essential (primary) hypertension: Secondary | ICD-10-CM | POA: Diagnosis not present

## 2023-03-21 DIAGNOSIS — Z7984 Long term (current) use of oral hypoglycemic drugs: Secondary | ICD-10-CM | POA: Diagnosis not present

## 2023-03-21 DIAGNOSIS — Z9181 History of falling: Secondary | ICD-10-CM | POA: Diagnosis not present

## 2023-03-21 DIAGNOSIS — E1151 Type 2 diabetes mellitus with diabetic peripheral angiopathy without gangrene: Secondary | ICD-10-CM | POA: Diagnosis not present

## 2023-03-21 DIAGNOSIS — E46 Unspecified protein-calorie malnutrition: Secondary | ICD-10-CM | POA: Diagnosis not present

## 2023-03-21 DIAGNOSIS — I70201 Unspecified atherosclerosis of native arteries of extremities, right leg: Secondary | ICD-10-CM | POA: Diagnosis not present

## 2023-03-22 ENCOUNTER — Ambulatory Visit (INDEPENDENT_AMBULATORY_CARE_PROVIDER_SITE_OTHER): Payer: Medicare HMO | Admitting: Family Medicine

## 2023-03-22 ENCOUNTER — Encounter: Payer: Self-pay | Admitting: Family Medicine

## 2023-03-22 ENCOUNTER — Ambulatory Visit: Payer: Medicare HMO | Admitting: Family Medicine

## 2023-03-22 VITALS — BP 110/68 | HR 94 | Temp 97.6°F | Wt 134.4 lb

## 2023-03-22 DIAGNOSIS — G20A1 Parkinson's disease without dyskinesia, without mention of fluctuations: Secondary | ICD-10-CM

## 2023-03-22 DIAGNOSIS — R630 Anorexia: Secondary | ICD-10-CM

## 2023-03-22 DIAGNOSIS — D509 Iron deficiency anemia, unspecified: Secondary | ICD-10-CM | POA: Diagnosis not present

## 2023-03-22 DIAGNOSIS — S32010D Wedge compression fracture of first lumbar vertebra, subsequent encounter for fracture with routine healing: Secondary | ICD-10-CM | POA: Diagnosis not present

## 2023-03-22 DIAGNOSIS — I10 Essential (primary) hypertension: Secondary | ICD-10-CM

## 2023-03-22 DIAGNOSIS — S32010A Wedge compression fracture of first lumbar vertebra, initial encounter for closed fracture: Secondary | ICD-10-CM

## 2023-03-22 DIAGNOSIS — K5520 Angiodysplasia of colon without hemorrhage: Secondary | ICD-10-CM

## 2023-03-22 DIAGNOSIS — K219 Gastro-esophageal reflux disease without esophagitis: Secondary | ICD-10-CM | POA: Diagnosis not present

## 2023-03-22 DIAGNOSIS — D649 Anemia, unspecified: Secondary | ICD-10-CM | POA: Diagnosis not present

## 2023-03-22 DIAGNOSIS — I714 Abdominal aortic aneurysm, without rupture, unspecified: Secondary | ICD-10-CM

## 2023-03-22 DIAGNOSIS — F0390 Unspecified dementia without behavioral disturbance: Secondary | ICD-10-CM | POA: Diagnosis not present

## 2023-03-22 LAB — CBC WITH DIFFERENTIAL/PLATELET
Basophils Absolute: 0 10*3/uL (ref 0.0–0.1)
Basophils Relative: 0.5 % (ref 0.0–3.0)
Eosinophils Absolute: 0.1 10*3/uL (ref 0.0–0.7)
Eosinophils Relative: 1.7 % (ref 0.0–5.0)
HCT: 34.9 % — ABNORMAL LOW (ref 39.0–52.0)
Hemoglobin: 10.8 g/dL — ABNORMAL LOW (ref 13.0–17.0)
Lymphocytes Relative: 13.6 % (ref 12.0–46.0)
Lymphs Abs: 1 10*3/uL (ref 0.7–4.0)
MCHC: 31.1 g/dL (ref 30.0–36.0)
MCV: 85.9 fl (ref 78.0–100.0)
Monocytes Absolute: 0.4 10*3/uL (ref 0.1–1.0)
Monocytes Relative: 5.6 % (ref 3.0–12.0)
Neutro Abs: 5.9 10*3/uL (ref 1.4–7.7)
Neutrophils Relative %: 78.6 % — ABNORMAL HIGH (ref 43.0–77.0)
Platelets: 236 10*3/uL (ref 150.0–400.0)
RBC: 4.06 Mil/uL — ABNORMAL LOW (ref 4.22–5.81)
RDW: 23 % — ABNORMAL HIGH (ref 11.5–15.5)
WBC: 7.5 10*3/uL (ref 4.0–10.5)

## 2023-03-22 MED ORDER — MEGESTROL ACETATE 40 MG PO TABS
40.0000 mg | ORAL_TABLET | Freq: Every day | ORAL | 0 refills | Status: DC
Start: 2023-03-22 — End: 2023-06-14

## 2023-03-22 NOTE — Progress Notes (Signed)
   Subjective:    Patient ID: Walter [name], male    DOB: 1946-03-30, 77 y.o.   MRN: [id number]  HPI Here with his wife to follow up a hospital stay from 02-21-23 to 03-10-23 and then a rehab stay at [facility] center until 03-19-23. Before the admission he had fallen and sustained a compression fracture to L1. As part of that workup, an US revealed a 6 cm infrarenal AAA, so he was sent to the ED. 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 A small PE was also incidentally found. He was treated with IV heparin at first and then the plan was for him to go on long term anticoagulation. Since he was anemic with a Hgb of 8.4, he had upper and lower endoscopies performed. These revealed a small angiectasia in the diodenum which was treated with APC and diffuse diverticulosis. His Hgb dropped as low as 6.9, and this was presumed to be the result of GI bleeding. The decision was made to avoid anticoagulation, and an IVC filter was placed instead. His Hgb had come back up to 7.9 at DC. Otherwise his heart function and renal functions were stable. At the rehab facility he had extensive PT on the lower spine, and now he is almost pain free. He is off narcotic medications and he takes only Tylenol as needed. His bowels  have been moving normally but his appetite is quite poor. He had been given Megace in the hospital, so his wife asks if he can take this at home.    Review of Systems  Constitutional: Negative.   Respiratory: Negative.    Cardiovascular: Negative.   Gastrointestinal: Negative.   Genitourinary: Negative.   Musculoskeletal:  Positive for back pain.  Neurological: Negative.        Objective:   Physical Exam Constitutional:      Appearance: He is not ill-appearing.     Comments: Walks with a walker   Cardiovascular:     Rate and Rhythm: Normal rate and regular rhythm.     Pulses: Normal pulses.     Heart  sounds: Normal heart sounds.  Pulmonary:     Effort: Pulmonary effort is normal.     Breath sounds: Normal breath sounds.  Abdominal:     General: Abdomen is flat. Bowel sounds are normal. There is no distension.     Palpations: Abdomen is soft. There is no mass.     Tenderness: There is no abdominal tenderness. There is no guarding or rebound.     Hernia: No hernia is present.  Musculoskeletal:     Right lower leg: No edema.     Left lower leg: No edema.  Neurological:     Mental Status: He is alert and oriented to person, place, and time.           Assessment & Plan:  He is recovering well from a lumbar compression fracture, and he is S/P placement of an IVC filter. We will follow up on the anemia by checking a CBC today. To boost the appetite we will start him on Megace 40 mg daily. He will see his PCP, Dr. Caryl Never, on 06-08-23. We spent a total of ( 35  ) minutes reviewing records and discussing these issues.  Gershon Crane, MD

## 2023-03-24 DIAGNOSIS — G20A1 Parkinson's disease without dyskinesia, without mention of fluctuations: Secondary | ICD-10-CM | POA: Diagnosis not present

## 2023-03-24 DIAGNOSIS — I7143 Infrarenal abdominal aortic aneurysm, without rupture: Secondary | ICD-10-CM | POA: Diagnosis not present

## 2023-03-24 DIAGNOSIS — F0280 Dementia in other diseases classified elsewhere without behavioral disturbance: Secondary | ICD-10-CM | POA: Diagnosis not present

## 2023-03-24 DIAGNOSIS — Z5982 Transportation insecurity: Secondary | ICD-10-CM | POA: Diagnosis not present

## 2023-03-24 DIAGNOSIS — Z9181 History of falling: Secondary | ICD-10-CM | POA: Diagnosis not present

## 2023-03-24 DIAGNOSIS — I2693 Single subsegmental pulmonary embolism without acute cor pulmonale: Secondary | ICD-10-CM | POA: Diagnosis not present

## 2023-03-24 DIAGNOSIS — Z7984 Long term (current) use of oral hypoglycemic drugs: Secondary | ICD-10-CM | POA: Diagnosis not present

## 2023-03-24 DIAGNOSIS — I10 Essential (primary) hypertension: Secondary | ICD-10-CM | POA: Diagnosis not present

## 2023-03-24 DIAGNOSIS — E1151 Type 2 diabetes mellitus with diabetic peripheral angiopathy without gangrene: Secondary | ICD-10-CM | POA: Diagnosis not present

## 2023-03-24 DIAGNOSIS — I2510 Atherosclerotic heart disease of native coronary artery without angina pectoris: Secondary | ICD-10-CM | POA: Diagnosis not present

## 2023-03-24 DIAGNOSIS — I82441 Acute embolism and thrombosis of right tibial vein: Secondary | ICD-10-CM | POA: Diagnosis not present

## 2023-03-24 DIAGNOSIS — S32010D Wedge compression fracture of first lumbar vertebra, subsequent encounter for fracture with routine healing: Secondary | ICD-10-CM | POA: Diagnosis not present

## 2023-03-24 DIAGNOSIS — I70201 Unspecified atherosclerosis of native arteries of extremities, right leg: Secondary | ICD-10-CM | POA: Diagnosis not present

## 2023-03-24 DIAGNOSIS — D649 Anemia, unspecified: Secondary | ICD-10-CM | POA: Diagnosis not present

## 2023-03-24 DIAGNOSIS — E46 Unspecified protein-calorie malnutrition: Secondary | ICD-10-CM | POA: Diagnosis not present

## 2023-03-30 DIAGNOSIS — S32010D Wedge compression fracture of first lumbar vertebra, subsequent encounter for fracture with routine healing: Secondary | ICD-10-CM | POA: Diagnosis not present

## 2023-03-30 DIAGNOSIS — Z5982 Transportation insecurity: Secondary | ICD-10-CM | POA: Diagnosis not present

## 2023-03-30 DIAGNOSIS — E1151 Type 2 diabetes mellitus with diabetic peripheral angiopathy without gangrene: Secondary | ICD-10-CM | POA: Diagnosis not present

## 2023-03-30 DIAGNOSIS — D649 Anemia, unspecified: Secondary | ICD-10-CM | POA: Diagnosis not present

## 2023-03-30 DIAGNOSIS — I10 Essential (primary) hypertension: Secondary | ICD-10-CM | POA: Diagnosis not present

## 2023-03-30 DIAGNOSIS — I2510 Atherosclerotic heart disease of native coronary artery without angina pectoris: Secondary | ICD-10-CM | POA: Diagnosis not present

## 2023-03-30 DIAGNOSIS — I2693 Single subsegmental pulmonary embolism without acute cor pulmonale: Secondary | ICD-10-CM | POA: Diagnosis not present

## 2023-03-30 DIAGNOSIS — Z9181 History of falling: Secondary | ICD-10-CM | POA: Diagnosis not present

## 2023-03-30 DIAGNOSIS — G20A1 Parkinson's disease without dyskinesia, without mention of fluctuations: Secondary | ICD-10-CM | POA: Diagnosis not present

## 2023-03-30 DIAGNOSIS — I7143 Infrarenal abdominal aortic aneurysm, without rupture: Secondary | ICD-10-CM | POA: Diagnosis not present

## 2023-03-30 DIAGNOSIS — E46 Unspecified protein-calorie malnutrition: Secondary | ICD-10-CM | POA: Diagnosis not present

## 2023-03-30 DIAGNOSIS — I82441 Acute embolism and thrombosis of right tibial vein: Secondary | ICD-10-CM | POA: Diagnosis not present

## 2023-03-30 DIAGNOSIS — Z7984 Long term (current) use of oral hypoglycemic drugs: Secondary | ICD-10-CM | POA: Diagnosis not present

## 2023-03-30 DIAGNOSIS — F0280 Dementia in other diseases classified elsewhere without behavioral disturbance: Secondary | ICD-10-CM | POA: Diagnosis not present

## 2023-03-30 DIAGNOSIS — I70201 Unspecified atherosclerosis of native arteries of extremities, right leg: Secondary | ICD-10-CM | POA: Diagnosis not present

## 2023-03-31 ENCOUNTER — Telehealth: Payer: Self-pay | Admitting: Family Medicine

## 2023-03-31 NOTE — Telephone Encounter (Signed)
Walter [name] OT with [facility] is calling she did OT eval and OT is not needed at this time

## 2023-04-01 DIAGNOSIS — I70201 Unspecified atherosclerosis of native arteries of extremities, right leg: Secondary | ICD-10-CM | POA: Diagnosis not present

## 2023-04-01 DIAGNOSIS — G20A1 Parkinson's disease without dyskinesia, without mention of fluctuations: Secondary | ICD-10-CM | POA: Diagnosis not present

## 2023-04-01 DIAGNOSIS — F0280 Dementia in other diseases classified elsewhere without behavioral disturbance: Secondary | ICD-10-CM | POA: Diagnosis not present

## 2023-04-01 DIAGNOSIS — S32010D Wedge compression fracture of first lumbar vertebra, subsequent encounter for fracture with routine healing: Secondary | ICD-10-CM | POA: Diagnosis not present

## 2023-04-01 DIAGNOSIS — E46 Unspecified protein-calorie malnutrition: Secondary | ICD-10-CM | POA: Diagnosis not present

## 2023-04-01 DIAGNOSIS — I2693 Single subsegmental pulmonary embolism without acute cor pulmonale: Secondary | ICD-10-CM | POA: Diagnosis not present

## 2023-04-01 DIAGNOSIS — Z9181 History of falling: Secondary | ICD-10-CM | POA: Diagnosis not present

## 2023-04-01 DIAGNOSIS — Z5982 Transportation insecurity: Secondary | ICD-10-CM | POA: Diagnosis not present

## 2023-04-01 DIAGNOSIS — I10 Essential (primary) hypertension: Secondary | ICD-10-CM | POA: Diagnosis not present

## 2023-04-01 DIAGNOSIS — D649 Anemia, unspecified: Secondary | ICD-10-CM | POA: Diagnosis not present

## 2023-04-01 DIAGNOSIS — I2510 Atherosclerotic heart disease of native coronary artery without angina pectoris: Secondary | ICD-10-CM | POA: Diagnosis not present

## 2023-04-01 DIAGNOSIS — I7143 Infrarenal abdominal aortic aneurysm, without rupture: Secondary | ICD-10-CM | POA: Diagnosis not present

## 2023-04-01 DIAGNOSIS — E1151 Type 2 diabetes mellitus with diabetic peripheral angiopathy without gangrene: Secondary | ICD-10-CM | POA: Diagnosis not present

## 2023-04-01 DIAGNOSIS — Z7984 Long term (current) use of oral hypoglycemic drugs: Secondary | ICD-10-CM | POA: Diagnosis not present

## 2023-04-01 DIAGNOSIS — I82441 Acute embolism and thrombosis of right tibial vein: Secondary | ICD-10-CM | POA: Diagnosis not present

## 2023-04-05 ENCOUNTER — Other Ambulatory Visit: Payer: Self-pay | Admitting: Family Medicine

## 2023-04-05 DIAGNOSIS — E1151 Type 2 diabetes mellitus with diabetic peripheral angiopathy without gangrene: Secondary | ICD-10-CM

## 2023-04-06 DIAGNOSIS — I2510 Atherosclerotic heart disease of native coronary artery without angina pectoris: Secondary | ICD-10-CM | POA: Diagnosis not present

## 2023-04-06 DIAGNOSIS — G20A1 Parkinson's disease without dyskinesia, without mention of fluctuations: Secondary | ICD-10-CM | POA: Diagnosis not present

## 2023-04-06 DIAGNOSIS — E1151 Type 2 diabetes mellitus with diabetic peripheral angiopathy without gangrene: Secondary | ICD-10-CM | POA: Diagnosis not present

## 2023-04-06 DIAGNOSIS — S32010D Wedge compression fracture of first lumbar vertebra, subsequent encounter for fracture with routine healing: Secondary | ICD-10-CM | POA: Diagnosis not present

## 2023-04-06 DIAGNOSIS — Z9181 History of falling: Secondary | ICD-10-CM | POA: Diagnosis not present

## 2023-04-06 DIAGNOSIS — I82441 Acute embolism and thrombosis of right tibial vein: Secondary | ICD-10-CM | POA: Diagnosis not present

## 2023-04-06 DIAGNOSIS — I70201 Unspecified atherosclerosis of native arteries of extremities, right leg: Secondary | ICD-10-CM | POA: Diagnosis not present

## 2023-04-06 DIAGNOSIS — Z5982 Transportation insecurity: Secondary | ICD-10-CM | POA: Diagnosis not present

## 2023-04-06 DIAGNOSIS — D649 Anemia, unspecified: Secondary | ICD-10-CM | POA: Diagnosis not present

## 2023-04-06 DIAGNOSIS — I2693 Single subsegmental pulmonary embolism without acute cor pulmonale: Secondary | ICD-10-CM | POA: Diagnosis not present

## 2023-04-06 DIAGNOSIS — I7143 Infrarenal abdominal aortic aneurysm, without rupture: Secondary | ICD-10-CM | POA: Diagnosis not present

## 2023-04-06 DIAGNOSIS — E46 Unspecified protein-calorie malnutrition: Secondary | ICD-10-CM | POA: Diagnosis not present

## 2023-04-06 DIAGNOSIS — I10 Essential (primary) hypertension: Secondary | ICD-10-CM | POA: Diagnosis not present

## 2023-04-06 DIAGNOSIS — F0280 Dementia in other diseases classified elsewhere without behavioral disturbance: Secondary | ICD-10-CM | POA: Diagnosis not present

## 2023-04-06 DIAGNOSIS — Z7984 Long term (current) use of oral hypoglycemic drugs: Secondary | ICD-10-CM | POA: Diagnosis not present

## 2023-04-12 ENCOUNTER — Telehealth: Payer: Self-pay | Admitting: Family Medicine

## 2023-04-12 DIAGNOSIS — D649 Anemia, unspecified: Secondary | ICD-10-CM

## 2023-04-12 NOTE — Telephone Encounter (Addendum)
Pt wife mildred is calling and would like lab order to be fax to [facility] in [place] phone number [phone] and fax number is [phone]. Their address is [address] [name]

## 2023-04-12 NOTE — Telephone Encounter (Signed)
I spoke with the patient's wife and she reported she would like the patient to have CBC to check his Hbg and blood count as she states this can decrease fairly quickly.

## 2023-04-13 DIAGNOSIS — E46 Unspecified protein-calorie malnutrition: Secondary | ICD-10-CM | POA: Diagnosis not present

## 2023-04-13 DIAGNOSIS — D649 Anemia, unspecified: Secondary | ICD-10-CM | POA: Diagnosis not present

## 2023-04-13 DIAGNOSIS — I2693 Single subsegmental pulmonary embolism without acute cor pulmonale: Secondary | ICD-10-CM | POA: Diagnosis not present

## 2023-04-13 DIAGNOSIS — G20A1 Parkinson's disease without dyskinesia, without mention of fluctuations: Secondary | ICD-10-CM | POA: Diagnosis not present

## 2023-04-13 DIAGNOSIS — I2510 Atherosclerotic heart disease of native coronary artery without angina pectoris: Secondary | ICD-10-CM | POA: Diagnosis not present

## 2023-04-13 DIAGNOSIS — I70201 Unspecified atherosclerosis of native arteries of extremities, right leg: Secondary | ICD-10-CM | POA: Diagnosis not present

## 2023-04-13 DIAGNOSIS — Z7984 Long term (current) use of oral hypoglycemic drugs: Secondary | ICD-10-CM | POA: Diagnosis not present

## 2023-04-13 DIAGNOSIS — I82441 Acute embolism and thrombosis of right tibial vein: Secondary | ICD-10-CM | POA: Diagnosis not present

## 2023-04-13 DIAGNOSIS — I7143 Infrarenal abdominal aortic aneurysm, without rupture: Secondary | ICD-10-CM | POA: Diagnosis not present

## 2023-04-13 DIAGNOSIS — F0280 Dementia in other diseases classified elsewhere without behavioral disturbance: Secondary | ICD-10-CM | POA: Diagnosis not present

## 2023-04-13 DIAGNOSIS — Z5982 Transportation insecurity: Secondary | ICD-10-CM | POA: Diagnosis not present

## 2023-04-13 DIAGNOSIS — S32010D Wedge compression fracture of first lumbar vertebra, subsequent encounter for fracture with routine healing: Secondary | ICD-10-CM | POA: Diagnosis not present

## 2023-04-13 DIAGNOSIS — E1151 Type 2 diabetes mellitus with diabetic peripheral angiopathy without gangrene: Secondary | ICD-10-CM | POA: Diagnosis not present

## 2023-04-13 DIAGNOSIS — I10 Essential (primary) hypertension: Secondary | ICD-10-CM | POA: Diagnosis not present

## 2023-04-13 DIAGNOSIS — Z9181 History of falling: Secondary | ICD-10-CM | POA: Diagnosis not present

## 2023-04-13 NOTE — Telephone Encounter (Signed)
Pt requesting a call to let her know when this has been placed.

## 2023-04-13 NOTE — Telephone Encounter (Signed)
Order is placed.

## 2023-04-14 NOTE — Telephone Encounter (Signed)
The order needs to be fax

## 2023-04-14 NOTE — Telephone Encounter (Signed)
Lab order faxed to location below.   Attempted to reach to direct location to make sure they receive it. Left a voicemail to call us back.

## 2023-04-14 NOTE — Telephone Encounter (Signed)
Attempted to reach Mrs. Walter [name]. Left a detail message. Contact us back if any have questions.

## 2023-04-18 ENCOUNTER — Other Ambulatory Visit: Payer: Self-pay | Admitting: Family Medicine

## 2023-04-18 DIAGNOSIS — D649 Anemia, unspecified: Secondary | ICD-10-CM | POA: Diagnosis not present

## 2023-04-19 DIAGNOSIS — I2693 Single subsegmental pulmonary embolism without acute cor pulmonale: Secondary | ICD-10-CM | POA: Diagnosis not present

## 2023-04-19 DIAGNOSIS — I2510 Atherosclerotic heart disease of native coronary artery without angina pectoris: Secondary | ICD-10-CM | POA: Diagnosis not present

## 2023-04-19 DIAGNOSIS — I82441 Acute embolism and thrombosis of right tibial vein: Secondary | ICD-10-CM | POA: Diagnosis not present

## 2023-04-19 DIAGNOSIS — D649 Anemia, unspecified: Secondary | ICD-10-CM | POA: Diagnosis not present

## 2023-04-19 DIAGNOSIS — E46 Unspecified protein-calorie malnutrition: Secondary | ICD-10-CM | POA: Diagnosis not present

## 2023-04-19 DIAGNOSIS — E1151 Type 2 diabetes mellitus with diabetic peripheral angiopathy without gangrene: Secondary | ICD-10-CM | POA: Diagnosis not present

## 2023-04-19 DIAGNOSIS — G20A1 Parkinson's disease without dyskinesia, without mention of fluctuations: Secondary | ICD-10-CM | POA: Diagnosis not present

## 2023-04-19 DIAGNOSIS — S32010D Wedge compression fracture of first lumbar vertebra, subsequent encounter for fracture with routine healing: Secondary | ICD-10-CM | POA: Diagnosis not present

## 2023-04-19 DIAGNOSIS — Z7984 Long term (current) use of oral hypoglycemic drugs: Secondary | ICD-10-CM | POA: Diagnosis not present

## 2023-04-19 DIAGNOSIS — Z9181 History of falling: Secondary | ICD-10-CM | POA: Diagnosis not present

## 2023-04-19 DIAGNOSIS — Z5982 Transportation insecurity: Secondary | ICD-10-CM | POA: Diagnosis not present

## 2023-04-19 DIAGNOSIS — F0280 Dementia in other diseases classified elsewhere without behavioral disturbance: Secondary | ICD-10-CM | POA: Diagnosis not present

## 2023-04-19 DIAGNOSIS — I7143 Infrarenal abdominal aortic aneurysm, without rupture: Secondary | ICD-10-CM | POA: Diagnosis not present

## 2023-04-19 DIAGNOSIS — I10 Essential (primary) hypertension: Secondary | ICD-10-CM | POA: Diagnosis not present

## 2023-04-19 DIAGNOSIS — I70201 Unspecified atherosclerosis of native arteries of extremities, right leg: Secondary | ICD-10-CM | POA: Diagnosis not present

## 2023-04-19 LAB — CBC WITH DIFFERENTIAL/PLATELET
Immature Grans (Abs): 0 10*3/uL (ref 0.0–0.1)
Lymphocytes Absolute: 1.2 10*3/uL (ref 0.7–3.1)
Lymphs: 17 %
Platelets: 227 10*3/uL (ref 150–450)
RBC: 4.2 x10E6/uL (ref 4.14–5.80)
RDW: 18.1 % — ABNORMAL HIGH (ref 11.6–15.4)

## 2023-04-19 LAB — SPECIMEN STATUS REPORT

## 2023-04-20 LAB — CBC WITH DIFFERENTIAL/PLATELET
Basophils Absolute: 0 10*3/uL (ref 0.0–0.2)
Basos: 0 %
EOS (ABSOLUTE): 0.2 10*3/uL (ref 0.0–0.4)
Eos: 2 %
Hematocrit: 36.1 % — ABNORMAL LOW (ref 37.5–51.0)
Hemoglobin: 11.3 g/dL — ABNORMAL LOW (ref 13.0–17.7)
Immature Granulocytes: 0 %
MCH: 26.9 pg (ref 26.6–33.0)
MCHC: 31.3 g/dL — ABNORMAL LOW (ref 31.5–35.7)
MCV: 86 fL (ref 79–97)
Monocytes Absolute: 0.4 10*3/uL (ref 0.1–0.9)
Monocytes: 6 %
Neutrophils Absolute: 5.2 10*3/uL (ref 1.4–7.0)
Neutrophils: 75 %
WBC: 7.1 10*3/uL (ref 3.4–10.8)

## 2023-04-27 ENCOUNTER — Ambulatory Visit (INDEPENDENT_AMBULATORY_CARE_PROVIDER_SITE_OTHER): Payer: Medicare HMO

## 2023-04-27 VITALS — Ht 67.0 in | Wt 134.0 lb

## 2023-04-27 DIAGNOSIS — S32010D Wedge compression fracture of first lumbar vertebra, subsequent encounter for fracture with routine healing: Secondary | ICD-10-CM | POA: Diagnosis not present

## 2023-04-27 DIAGNOSIS — I2510 Atherosclerotic heart disease of native coronary artery without angina pectoris: Secondary | ICD-10-CM | POA: Diagnosis not present

## 2023-04-27 DIAGNOSIS — I10 Essential (primary) hypertension: Secondary | ICD-10-CM | POA: Diagnosis not present

## 2023-04-27 DIAGNOSIS — I2693 Single subsegmental pulmonary embolism without acute cor pulmonale: Secondary | ICD-10-CM | POA: Diagnosis not present

## 2023-04-27 DIAGNOSIS — I82441 Acute embolism and thrombosis of right tibial vein: Secondary | ICD-10-CM | POA: Diagnosis not present

## 2023-04-27 DIAGNOSIS — I70201 Unspecified atherosclerosis of native arteries of extremities, right leg: Secondary | ICD-10-CM | POA: Diagnosis not present

## 2023-04-27 DIAGNOSIS — Z0000 Encounter for general adult medical examination without abnormal findings: Secondary | ICD-10-CM | POA: Diagnosis not present

## 2023-04-27 DIAGNOSIS — F0280 Dementia in other diseases classified elsewhere without behavioral disturbance: Secondary | ICD-10-CM | POA: Diagnosis not present

## 2023-04-27 DIAGNOSIS — G20A1 Parkinson's disease without dyskinesia, without mention of fluctuations: Secondary | ICD-10-CM | POA: Diagnosis not present

## 2023-04-27 DIAGNOSIS — I7143 Infrarenal abdominal aortic aneurysm, without rupture: Secondary | ICD-10-CM | POA: Diagnosis not present

## 2023-04-27 DIAGNOSIS — E1151 Type 2 diabetes mellitus with diabetic peripheral angiopathy without gangrene: Secondary | ICD-10-CM | POA: Diagnosis not present

## 2023-04-27 NOTE — Patient Instructions (Addendum)
Walter [name] , Thank you for taking time to come for your Medicare Wellness Visit. I appreciate your ongoing commitment to your health goals. Please review the following plan we discussed and let me know if I can assist you in the future.   These are the goals we discussed:  Goals       Blood Pressure < 140/90      Patient Stated (pt-stated)      CARE PLAN ENTRY  Current Barriers:  Chronic Disease Management support, education, and care coordination needs related to diabetes, hypertension  Pharmacist Clinical Goal(s):  Over the next 14 days, patient will demonstrate Improved medication adherence as evidenced by adherence packaging Blood pressure <140/90  Interventions: Comprehensive medication review performed. Collaboration with provider re: medication management  Patient Self Care Activities:  Patient verbalizes understanding of plan to continue to check blood pressure at home let us know if readings start trending above 140/90  Initial goal documentation       Patient Stated (pt-stated)      Maintain current health.        This is a list of the screening recommended for you and due dates:  Health Maintenance  Topic Date Due   Yearly kidney health urinalysis for diabetes  02/03/2019   COVID-19 Vaccine (3 - 2023-24 season) 05/13/2023*   Zoster (Shingles) Vaccine (1 of 2) 11/04/2023*   Flu Shot  06/02/2023   Hemoglobin A1C  06/08/2023   Eye exam for diabetics  10/05/2023   Complete foot exam   12/09/2023   Yearly kidney function blood test for diabetes  03/09/2024   Medicare Annual Wellness Visit  04/26/2024   Pneumonia Vaccine  Completed   Hepatitis C Screening  Completed   HPV Vaccine  Aged Out   DTaP/Tdap/Td vaccine  Discontinued   Colon Cancer Screening  Discontinued  *Topic was postponed. The date shown is not the original due date.    Advanced directives: Please bring a copy of your health care power of attorney and living will to the office to be added to  your chart at your convenience.   Conditions/risks identified: None  Next appointment: Follow up in one year for your annual wellness visit.   Preventive Care 78 Years and Older, Male  Preventive care refers to lifestyle choices and visits with your health care provider that can promote health and wellness. What does preventive care include? A yearly physical exam. This is also called an annual well check. Dental exams once or twice a year. Routine eye exams. Ask your health care provider how often you should have your eyes checked. Personal lifestyle choices, including: Daily care of your teeth and gums. Regular physical activity. Eating a healthy diet. Avoiding tobacco and drug use. Limiting alcohol use. Practicing safe sex. Taking low doses of aspirin every day. Taking vitamin and mineral supplements as recommended by your health care provider. What happens during an annual well check? The services and screenings done by your health care provider during your annual well check will depend on your age, overall health, lifestyle risk factors, and family history of disease. Counseling  Your health care provider may ask you questions about your: Alcohol use. Tobacco use. Drug use. Emotional well-being. Home and relationship well-being. Sexual activity. Eating habits. History of falls. Memory and ability to understand (cognition). Work and work Astronomer. Screening  You may have the following tests or measurements: Height, weight, and BMI. Blood pressure. Lipid and cholesterol levels. These may be checked every 5  years, or more frequently if you are over 87 years old. Skin check. Lung cancer screening. You may have this screening every year starting at age 89 if you have a 30-pack-year history of smoking and currently smoke or have quit within the past 15 years. Fecal occult blood test (FOBT) of the stool. You may have this test every year starting at age 42. Flexible  sigmoidoscopy or colonoscopy. You may have a sigmoidoscopy every 5 years or a colonoscopy every 10 years starting at age 6. Prostate cancer screening. Recommendations will vary depending on your family history and other risks. Hepatitis C blood test. Hepatitis B blood test. Sexually transmitted disease (STD) testing. Diabetes screening. This is done by checking your blood sugar (glucose) after you have not eaten for a while (fasting). You may have this done every 1-3 years. Abdominal aortic aneurysm (AAA) screening. You may need this if you are a current or former smoker. Osteoporosis. You may be screened starting at age 35 if you are at high risk. Talk with your health care provider about your test results, treatment options, and if necessary, the need for more tests. Vaccines  Your health care provider may recommend certain vaccines, such as: Influenza vaccine. This is recommended every year. Tetanus, diphtheria, and acellular pertussis (Tdap, Td) vaccine. You may need a Td booster every 10 years. Zoster vaccine. You may need this after age 76. Pneumococcal 13-valent conjugate (PCV13) vaccine. One dose is recommended after age 28. Pneumococcal polysaccharide (PPSV23) vaccine. One dose is recommended after age 64. Talk to your health care provider about which screenings and vaccines you need and how often you need them. This information is not intended to replace advice given to you by your health care provider. Make sure you discuss any questions you have with your health care provider. Document Released: 11/14/2015 Document Revised: 07/07/2016 Document Reviewed: 08/19/2015 Elsevier Interactive Patient Education  2017 [employer].  Fall Prevention in the Home Falls can cause injuries. They can happen to people of all ages. There are many things you can do to make your home safe and to help prevent falls. What can I do on the outside of my home? Regularly fix the edges of walkways and  driveways and fix any cracks. Remove anything that might make you trip as you walk through a door, such as a raised step or threshold. Trim any bushes or trees on the path to your home. Use bright outdoor lighting. Clear any walking paths of anything that might make someone trip, such as rocks or tools. Regularly check to see if handrails are loose or broken. Make sure that both sides of any steps have handrails. Any raised decks and porches should have guardrails on the edges. Have any leaves, snow, or ice cleared regularly. Use sand or salt on walking paths during winter. Clean up any spills in your garage right away. This includes oil or grease spills. What can I do in the bathroom? Use night lights. Install grab bars by the toilet and in the tub and shower. Do not use towel bars as grab bars. Use non-skid mats or decals in the tub or shower. If you need to sit down in the shower, use a plastic, non-slip stool. Keep the floor dry. Clean up any water that spills on the floor as soon as it happens. Remove soap buildup in the tub or shower regularly. Attach bath mats securely with double-sided non-slip rug tape. Do not have throw rugs and other things on the  floor that can make you trip. What can I do in the bedroom? Use night lights. Make sure that you have a light by your bed that is easy to reach. Do not use any sheets or blankets that are too big for your bed. They should not hang down onto the floor. Have a firm chair that has side arms. You can use this for support while you get dressed. Do not have throw rugs and other things on the floor that can make you trip. What can I do in the kitchen? Clean up any spills right away. Avoid walking on wet floors. Keep items that you use a lot in easy-to-reach places. If you need to reach something above you, use a strong step stool that has a grab bar. Keep electrical cords out of the way. Do not use floor polish or wax that makes floors  slippery. If you must use wax, use non-skid floor wax. Do not have throw rugs and other things on the floor that can make you trip. What can I do with my stairs? Do not leave any items on the stairs. Make sure that there are handrails on both sides of the stairs and use them. Fix handrails that are broken or loose. Make sure that handrails are as long as the stairways. Check any carpeting to make sure that it is firmly attached to the stairs. Fix any carpet that is loose or worn. Avoid having throw rugs at the top or bottom of the stairs. If you do have throw rugs, attach them to the floor with carpet tape. Make sure that you have a light switch at the top of the stairs and the bottom of the stairs. If you do not have them, ask someone to add them for you. What else can I do to help prevent falls? Wear shoes that: Do not have high heels. Have rubber bottoms. Are comfortable and fit you well. Are closed at the toe. Do not wear sandals. If you use a stepladder: Make sure that it is fully opened. Do not climb a closed stepladder. Make sure that both sides of the stepladder are locked into place. Ask someone to hold it for you, if possible. Clearly mark and make sure that you can see: Any grab bars or handrails. First and last steps. Where the edge of each step is. Use tools that help you move around (mobility aids) if they are needed. These include: Canes. Walkers. Scooters. Crutches. Turn on the lights when you go into a dark area. Replace any light bulbs as soon as they burn out. Set up your furniture so you have a clear path. Avoid moving your furniture around. If any of your floors are uneven, fix them. If there are any pets around you, be aware of where they are. Review your medicines with your doctor. Some medicines can make you feel dizzy. This can increase your chance of falling. Ask your doctor what other things that you can do to help prevent falls. This information is not  intended to replace advice given to you by your health care provider. Make sure you discuss any questions you have with your health care provider. Document Released: 08/14/2009 Document Revised: 03/25/2016 Document Reviewed: 11/22/2014 Elsevier Interactive Patient Education  2017 [employer].

## 2023-04-27 NOTE — Progress Notes (Deleted)
Subjective:   Walter [name] is a 77 y.o. male who presents for Medicare Annual/Subsequent preventive examination.  Visit Complete: Virtual  I connected with  Walter [name] on 04/27/23 by a audio enabled telemedicine application and verified that I am speaking with the correct person using two identifiers.  Patient Location: Home  Provider Location: Home Office  I discussed the limitations of evaluation and management by telemedicine. The patient expressed understanding and agreed to proceed.  Patient Medicare AWV questionnaire was completed by the patient on ; I have confirmed that all information answered by patient is correct and no changes since this date.  Review of Systems           Objective:    There were no vitals filed for this visit. There is no height or weight on file to calculate BMI.     03/07/2023   10:00 PM 03/07/2023    8:10 AM 02/22/2023    5:06 PM 02/21/2023   12:00 PM 10/29/2022   11:20 AM 04/27/2022   11:23 AM 04/23/2022   10:39 AM  Advanced Directives  Does Patient Have a Medical Advance Directive?  Yes No No Yes Yes Yes  Type of Advance Directive  Out of facility DNR (pink MOST or yellow form)   Healthcare Power of [place];Living will;Out of facility DNR (pink MOST or yellow form) Healthcare Power of [place];Living will;Out of facility DNR (pink MOST or yellow form) Healthcare Power of [place];Living will  Does patient want to make changes to medical advance directive? No - Patient declined      No - Patient declined  Copy of Healthcare Power of Attorney in Chart?       No - copy requested  Would patient like information on creating a medical advance directive?   Yes (Inpatient - patient requests chaplain consult to create a medical advance directive)      Pre-existing out of facility DNR order (yellow form or pink MOST form)  Yellow form placed in chart (order not valid for inpatient use)         Current Medications (verified) Outpatient  Encounter Medications as of 04/27/2023  Medication Sig   amLODipine (NORVASC) 5 MG tablet Take 1 tablet by mouth once daily (Patient taking differently: Take 5 mg by mouth daily.)   atorvastatin (LIPITOR) 40 MG tablet Take 1 tablet by mouth once daily   Blood Glucose Monitoring Suppl (ONE TOUCH ULTRA 2) w/Device KIT 1 kit by Does not apply route daily.   cyanocobalamin 2000 MCG tablet Take 1 tablet (2,000 mcg total) by mouth daily.   Dihydroxyaluminum Sod Carb (ROLAIDS PO) Take 1 tablet by mouth as needed (heartburn).   diphenhydrAMINE (BENADRYL) 50 MG tablet Take 0.5 tablets (25 mg total) by mouth every 8 (eight) hours as needed for itching.   docusate sodium (COLACE) 100 MG capsule Take 1 capsule (100 mg total) by mouth 2 (two) times daily.   donepezil (ARICEPT) 10 MG tablet Take 1 tablet daily (Patient taking differently: Take 10 mg by mouth daily.)   feeding supplement (ENSURE ENLIVE / ENSURE PLUS) LIQD Take 237 mLs by mouth 2 (two) times daily between meals.   Iron, Ferrous Sulfate, 325 (65 Fe) MG TABS Take 325 mg by mouth daily.   Lancets (ONETOUCH ULTRASOFT) lancets Check blood sugars once daily. Dx: E11.51   lidocaine 4 % Place 1 patch onto the skin daily as needed (pain).   losartan (COZAAR) 100 MG tablet Take 1 tablet by mouth once  daily   megestrol (MEGACE) 40 MG tablet Take 1 tablet (40 mg total) by mouth daily.   memantine (NAMENDA) 10 MG tablet Take 1 tablet twice a day (Patient taking differently: Take 10 mg by mouth 2 (two) times daily.)   Multiple Vitamin (MULTIVITAMIN WITH MINERALS) TABS tablet Take 1 tablet by mouth daily.   ONETOUCH ULTRA test strip USE 1 STRIP TO CHECK GLUCOSE ONCE DAILY   OVER THE COUNTER MEDICATION Take 1 tablet by mouth daily. Allergy relief   pantoprazole (PROTONIX) 40 MG tablet Take 1 tablet (40 mg total) by mouth daily. (Patient not taking: Reported on 03/22/2023)   No facility-administered encounter medications on file as of 04/27/2023.    Allergies  (verified) Patient has no known allergies.   History: Past Medical History:  Diagnosis Date   Allergy    Cancer (HCC)    Skin cancer   Cataract    Coronary artery disease    Diabetes mellitus    Diverticulosis    Esophageal stricture    GERD (gastroesophageal reflux disease)    Hyperlipidemia    Hypertension    Peripheral vascular disease (HCC)    Past Surgical History:  Procedure Laterality Date   BIOPSY  03/07/2023   Procedure: BIOPSY;  Surgeon: Beverley Fiedler, MD;  Location: [facility] ENDOSCOPY;  Service: Gastroenterology;;   COLONOSCOPY WITH PROPOFOL N/A 03/07/2023   Procedure: COLONOSCOPY WITH PROPOFOL;  Surgeon: Beverley Fiedler, MD;  Location: [facility] ENDOSCOPY;  Service: Gastroenterology;  Laterality: N/A;   CORONARY ANGIOPLASTY WITH STENT PLACEMENT     ESOPHAGOGASTRODUODENOSCOPY  06/07/2012   Procedure: ESOPHAGOGASTRODUODENOSCOPY (EGD);  Surgeon: Hart Carwin, MD;  Location: [facility] ENDOSCOPY;  Service: Endoscopy;  Laterality: N/A;   ESOPHAGOGASTRODUODENOSCOPY (EGD) WITH PROPOFOL N/A 03/07/2023   Procedure: ESOPHAGOGASTRODUODENOSCOPY (EGD) WITH PROPOFOL;  Surgeon: Beverley Fiedler, MD;  Location: [facility] ENDOSCOPY;  Service: Gastroenterology;  Laterality: N/A;   HOT HEMOSTASIS N/A 03/07/2023   Procedure: HOT HEMOSTASIS (ARGON PLASMA COAGULATION/BICAP);  Surgeon: Beverley Fiedler, MD;  Location: [facility] ENDOSCOPY;  Service: Gastroenterology;  Laterality: N/A;   IR KYPHO LUMBAR INC FX REDUCE BONE BX UNI/BIL CANNULATION INC/IMAGING  03/02/2023   IVC FILTER INSERTION N/A 03/09/2023   Procedure: IVC FILTER INSERTION;  Surgeon: Victorino Sparrow, MD;  Location: [facility] INVASIVE CV LAB;  Service: Cardiovascular;  Laterality: N/A;   POLYPECTOMY  03/07/2023   Procedure: POLYPECTOMY;  Surgeon: Beverley Fiedler, MD;  Location: [facility] ENDOSCOPY;  Service: Gastroenterology;;   SHOULDER SURGERY  2006   Family History  Problem Relation Age of Onset   Breast cancer Mother    Healthy Son    Healthy Son    Parkinson's disease Sister    Colon  cancer Neg Hx    Esophageal cancer Neg Hx    Rectal cancer Neg Hx    Stomach cancer Neg Hx    Social History   Socioeconomic History   Marital status: Married    Spouse name: Not on file   Number of children: 3   Years of education: Not on file   Highest education level: Not on file  Occupational History   Occupation: Auditor  Tobacco Use   Smoking status: Former    Types: Cigarettes    Quit date: 11/02/1999    Years since quitting: 23.4   Smokeless tobacco: Never  Vaping Use   Vaping Use: Never used  Substance and Sexual Activity   Alcohol use: No   Drug use: No   Sexual activity: Not Currently  Partners: Female  Other Topics Concern   Not on file  Social History Narrative   Daily caffeine       Right handed      Lives with wife in a one story home      Social Determinants of Health   Financial Resource Strain: Low Risk  (04/23/2022)   Overall Financial Resource Strain (CARDIA)    Difficulty of Paying Living Expenses: Not hard at all  Food Insecurity: Patient Declined (02/22/2023)   Hunger Vital Sign    Worried About Running Out of Food in the Last Year: Patient declined    Ran Out of Food in the Last Year: Patient declined  Transportation Needs: Patient Declined (02/22/2023)   PRAPARE - Administrator, Civil Service (Medical): Patient declined    Lack of Transportation (Non-Medical): Patient declined  Physical Activity: Inactive (04/23/2022)   Exercise Vital Sign    Days of Exercise per Week: 0 days    Minutes of Exercise per Session: 0 min  Stress: No Stress Concern Present (04/23/2022)   [employer] of Occupational Health - Occupational Stress Questionnaire    Feeling of Stress : Not at all  Social Connections: Socially Integrated (04/23/2022)   Social Connection and Isolation Panel [NHANES]    Frequency of Communication with Friends and Family: More than three times a week    Frequency of Social Gatherings with Friends and Family: More than  three times a week    Attends Religious Services: More than 4 times per year    Active Member of [employer] or Organizations: Yes    Attends Engineer, structural: More than 4 times per year    Marital Status: Married    Tobacco Counseling Counseling given: Not Answered   Clinical Intake:                        Activities of Daily Living    02/22/2023    5:06 PM  In your present state of health, do you have any difficulty performing the following activities:  Hearing? 0  Vision? 0  Difficulty concentrating or making decisions? 0  Walking or climbing stairs? 0  Dressing or bathing? 0  Doing errands, shopping? 0    Patient Care Team: Kristian Covey, MD as PCP - General (Family Medicine) Van Clines, MD as Consulting Physician (Neurology) Dr [facility], OD (Optometry) Dahlia Byes, [facility] (Inactive) as Pharmacist (Pharmacist)  Indicate any recent Medical Services you may have received from other than [facility] providers in the past year (date may be approximate).     Assessment:   This is a routine wellness examination for [place].  Hearing/Vision screen No results found.  Dietary issues and exercise activities discussed:     Goals Addressed   None    Depression Screen    02/16/2023   10:15 AM 12/08/2022   11:14 AM 04/23/2022   10:32 AM 12/07/2021   11:01 AM 04/22/2021    1:19 PM 04/22/2021    1:13 PM 04/14/2020    3:05 PM  PHQ 2/9 Scores  PHQ - 2 Score 4 2 0 0 0 0 0  PHQ- 9 Score 18 5         Fall Risk    02/16/2023   10:15 AM 12/08/2022   11:13 AM 10/29/2022   11:20 AM 04/27/2022   11:23 AM 04/23/2022   10:37 AM  Fall Risk   Falls in the past  year? 1 0 0 1 1  Number falls in past yr: 1 0 0 1 0  Injury with Fall? 0 0 0 0 0  Risk for fall due to : History of fall(s);Impaired balance/gait No Fall Risks   No Fall Risks  Follow up Falls evaluation completed Falls evaluation completed Falls evaluation completed  Falls  prevention discussed    MEDICARE RISK AT HOME:   TIMED UP AND GO:  Was the test performed?  No    Cognitive Function:    10/13/2021    3:00 PM 01/14/2021   10:00 AM 04/19/2019    7:05 PM  MMSE - Mini Mental State Exam  Orientation to time 4 5 5   Orientation to Place 5 5 5   Registration 3 3 3   Attention/ Calculation 4 4 3   Recall 2 2 3   Language- name 2 objects 2 2 2   Language- repeat 1 1 1   Language- follow 3 step command 3 3 3   Language- read & follow direction 1 1 1   Write a sentence 1 1 1   Copy design 0 1 1  Total score 26 28 28       10/08/2019   11:00 AM 06/08/2019   10:00 AM  Montreal Cognitive Assessment   Visuospatial/ Executive (0/5)  2  Naming (0/3)  2  Attention: Read list of digits (0/2) 1 2  Attention: Read list of letters (0/1) 1 1  Attention: Serial 7 subtraction starting at 100 (0/3) 3 3  Language: Repeat phrase (0/2) 1 1  Language : Fluency (0/1) 0 0  Abstraction (0/2) 1 0  Delayed Recall (0/5) 0 3  Orientation (0/6) 6 6  Total  20  Adjusted Score (based on education)  21      04/23/2022   10:39 AM 04/14/2020    3:08 PM  6CIT Screen  What Year? 0 points 4 points  What month? 0 points 0 points  What time? 0 points 0 points  Count back from 20 0 points 0 points  Months in reverse 0 points 2 points  Repeat phrase 0 points 0 points  Total Score 0 points 6 points    Immunizations Immunization History  Administered Date(s) Administered   Fluad Quad(high Dose 65+) 07/18/2020, 10/07/2022   Influenza Split 08/16/2011   Influenza Whole 08/01/2006, 07/31/2008, 09/17/2009, 07/15/2010   Influenza, High Dose Seasonal PF 10/02/2014, 12/11/2015, 10/06/2016, 08/04/2017, 07/20/2018, 07/28/2019   Influenza,inj,Quad PF,6+ Mos 10/02/2013   Moderna Sars-Covid-2 Vaccination 12/13/2019, 01/10/2020   Pneumococcal Conjugate-13 04/02/2014   Pneumococcal Polysaccharide-23 08/01/2006, 03/12/2011   Td 03/16/2012   Tdap 03/16/2012   Zoster, Live 10/11/2011     {TDAP status:2101805}  {Flu Vaccine status:2101806}  {Pneumococcal vaccine status:2101807}  Covid-19 vaccine status: Completed vaccines  Qualifies for Shingles Vaccine? {YES/NO:21197}  Zostavax completed {YES/NO:21197}  {Shingrix Completed?:2101804}  Screening Tests Health Maintenance  Topic Date Due   Diabetic kidney evaluation - Urine ACR  02/03/2019   COVID-19 Vaccine (3 - 2023-24 season) 07/02/2022   Medicare Annual Wellness (AWV)  04/24/2023   Zoster Vaccines- Shingrix (1 of 2) 11/04/2023 (Originally [birth year]1995)   INFLUENZA VACCINE  06/02/2023   HEMOGLOBIN A1C  06/08/2023   OPHTHALMOLOGY EXAM  10/05/2023   FOOT EXAM  12/09/2023   Diabetic kidney evaluation - eGFR measurement  03/09/2024   Pneumonia Vaccine 30+ Years old  Completed   Hepatitis C Screening  Completed   HPV VACCINES  Aged Out   DTaP/Tdap/Td  Discontinued   Colonoscopy  Discontinued    Health  Maintenance  Health Maintenance Due  Topic Date Due   Diabetic kidney evaluation - Urine ACR  02/03/2019   COVID-19 Vaccine (3 - 2023-24 season) 07/02/2022   Medicare Annual Wellness (AWV)  04/24/2023    {Colorectal cancer screening:2101809}  Lung Cancer Screening: (Low Dose CT Chest recommended if Age 3-80 years, 20 pack-year currently smoking OR have quit w/in 15years.) {DOES NOT does:27190::"does not"} qualify.   Lung Cancer Screening Referral: ***  Additional Screening:  Hepatitis C Screening: {DOES NOT does:27190::"does not"} qualify; Completed ***  Vision Screening: Recommended annual ophthalmology exams for early detection of glaucoma and other disorders of the eye. Is the patient up to date with their annual eye exam?  Yes  Who is the provider or what is the name of the office in which the patient attends annual eye exams? Dr Earlene Plater If pt is not established with a provider, would they like to be referred to a provider to establish care? No .   Dental Screening: Recommended annual dental exams  for proper oral hygiene  Diabetic Foot Exam: Diabetic Foot Exam: Overdue, Pt has been advised about the importance in completing this exam. Pt is scheduled for diabetic foot exam on Followed by PCP.  Community Resource Referral / Chronic Care Management:  CRR required this visit?  No   CCM required this visit?  No     Plan:     I have personally reviewed and noted the following in the patient's chart:   Medical and social history Use of alcohol, tobacco or illicit drugs  Current medications and supplements including opioid prescriptions. Patient is not currently taking opioid prescriptions. Functional ability and status Nutritional status Physical activity Advanced directives List of other physicians Hospitalizations, surgeries, and ER visits in previous 12 months Vitals Screenings to include cognitive, depression, and falls Referrals and appointments  In addition, I have reviewed and discussed with patient certain preventive protocols, quality metrics, and best practice recommendations. A written personalized care plan for preventive services as well as general preventive health recommendations were provided to patient.     Tillie Rung, LPN   [id number]/61/[id number]   After Visit Summary: (MyChart) Due to this being a telephonic visit, the after visit summary with patients personalized plan was offered to patient via MyChart   Nurse Notes: None

## 2023-04-27 NOTE — Progress Notes (Signed)
Subjective:   Walter [name] is a 77 y.o. male who presents for Medicare Annual/Subsequent preventive examination.  Visit Complete: Virtual  I connected with  Walter [name] on 04/27/23 by a audio enabled telemedicine application and verified that I am speaking with the correct person using two identifiers.  Patient Location: Home  Provider Location: Home Office  I discussed the limitations of evaluation and management by telemedicine. The patient expressed understanding and agreed to proceed.  Patient Medicare AWV questionnaire was completed by the patient on ; I have confirmed that all information answered by patient is correct and no changes since this date.  Review of Systems     Cardiac Risk Factors include: advanced age (>69men, >34 women);male gender;diabetes mellitus;hypertension     Objective:    Today's Vitals   04/27/23 1101  Weight: 134 lb (60.8 kg)  Height: 5\' 7"  (1.702 m)   Body mass index is 20.99 kg/m.     04/27/2023   11:09 AM 03/07/2023   10:00 PM 03/07/2023    8:10 AM 02/22/2023    5:06 PM 02/21/2023   12:00 PM 10/29/2022   11:20 AM 04/27/2022   11:23 AM  Advanced Directives  Does Patient Have a Medical Advance Directive? Yes  Yes No No Yes Yes  Type of Estate agent of [place];Living will  Out of facility DNR (pink MOST or yellow form)   Healthcare Power of Walter [name];Living will;Out of facility DNR (pink MOST or yellow form) Healthcare Power of Walter [name];Living will;Out of facility DNR (pink MOST or yellow form)  Does patient want to make changes to medical advance directive?  No - Patient declined       Copy of Healthcare Power of Attorney in Chart? No - copy requested        Would patient like information on creating a medical advance directive?    Yes (Inpatient - patient requests chaplain consult to create a medical advance directive)     Pre-existing out of facility DNR order (yellow form or pink MOST form)   Yellow form  placed in chart (order not valid for inpatient use)        Current Medications (verified) Outpatient Encounter Medications as of 04/27/2023  Medication Sig   amLODipine (NORVASC) 5 MG tablet Take 1 tablet by mouth once daily (Patient taking differently: Take 5 mg by mouth daily.)   atorvastatin (LIPITOR) 40 MG tablet Take 1 tablet by mouth once daily   Blood Glucose Monitoring Suppl (ONE TOUCH ULTRA 2) w/Device KIT 1 kit by Does not apply route daily.   cyanocobalamin 2000 MCG tablet Take 1 tablet (2,000 mcg total) by mouth daily.   Dihydroxyaluminum Sod Carb (ROLAIDS PO) Take 1 tablet by mouth as needed (heartburn).   diphenhydrAMINE (BENADRYL) 50 MG tablet Take 0.5 tablets (25 mg total) by mouth every 8 (eight) hours as needed for itching.   docusate sodium (COLACE) 100 MG capsule Take 1 capsule (100 mg total) by mouth 2 (two) times daily.   donepezil (ARICEPT) 10 MG tablet Take 1 tablet daily (Patient taking differently: Take 10 mg by mouth daily.)   feeding supplement (ENSURE ENLIVE / ENSURE PLUS) LIQD Take 237 mLs by mouth 2 (two) times daily between meals.   Iron, Ferrous Sulfate, 325 (65 Fe) MG TABS Take 325 mg by mouth daily.   Lancets (ONETOUCH ULTRASOFT) lancets Check blood sugars once daily. Dx: E11.51   lidocaine 4 % Place 1 patch onto the skin daily as needed (pain).  losartan (COZAAR) 100 MG tablet Take 1 tablet by mouth once daily   megestrol (MEGACE) 40 MG tablet Take 1 tablet (40 mg total) by mouth daily.   memantine (NAMENDA) 10 MG tablet Take 1 tablet twice a day (Patient taking differently: Take 10 mg by mouth 2 (two) times daily.)   Multiple Vitamin (MULTIVITAMIN WITH MINERALS) TABS tablet Take 1 tablet by mouth daily.   ONETOUCH ULTRA test strip USE 1 STRIP TO CHECK GLUCOSE ONCE DAILY   OVER THE COUNTER MEDICATION Take 1 tablet by mouth daily. Allergy relief   pantoprazole (PROTONIX) 40 MG tablet Take 1 tablet (40 mg total) by mouth daily. (Patient not taking: Reported  on 03/22/2023)   No facility-administered encounter medications on file as of 04/27/2023.    Allergies (verified) Patient has no known allergies.   History: Past Medical History:  Diagnosis Date   Allergy    Cancer (HCC)    Skin cancer   Cataract    Coronary artery disease    Diabetes mellitus    Diverticulosis    Esophageal stricture    GERD (gastroesophageal reflux disease)    Hyperlipidemia    Hypertension    Peripheral vascular disease (HCC)    Past Surgical History:  Procedure Laterality Date   BIOPSY  03/07/2023   Procedure: BIOPSY;  Surgeon: Beverley Fiedler, MD;  Location: [facility] ENDOSCOPY;  Service: Gastroenterology;;   COLONOSCOPY WITH PROPOFOL N/A 03/07/2023   Procedure: COLONOSCOPY WITH PROPOFOL;  Surgeon: Beverley Fiedler, MD;  Location: [facility] ENDOSCOPY;  Service: Gastroenterology;  Laterality: N/A;   CORONARY ANGIOPLASTY WITH STENT PLACEMENT     ESOPHAGOGASTRODUODENOSCOPY  06/07/2012   Procedure: ESOPHAGOGASTRODUODENOSCOPY (EGD);  Surgeon: Hart Carwin, MD;  Location: [facility] ENDOSCOPY;  Service: Endoscopy;  Laterality: N/A;   ESOPHAGOGASTRODUODENOSCOPY (EGD) WITH PROPOFOL N/A 03/07/2023   Procedure: ESOPHAGOGASTRODUODENOSCOPY (EGD) WITH PROPOFOL;  Surgeon: Beverley Fiedler, MD;  Location: [facility] ENDOSCOPY;  Service: Gastroenterology;  Laterality: N/A;   HOT HEMOSTASIS N/A 03/07/2023   Procedure: HOT HEMOSTASIS (ARGON PLASMA COAGULATION/BICAP);  Surgeon: Beverley Fiedler, MD;  Location: [facility] ENDOSCOPY;  Service: Gastroenterology;  Laterality: N/A;   IR KYPHO LUMBAR INC FX REDUCE BONE BX UNI/BIL CANNULATION INC/IMAGING  03/02/2023   IVC FILTER INSERTION N/A 03/09/2023   Procedure: IVC FILTER INSERTION;  Surgeon: Victorino Sparrow, MD;  Location: [facility] INVASIVE CV LAB;  Service: Cardiovascular;  Laterality: N/A;   POLYPECTOMY  03/07/2023   Procedure: POLYPECTOMY;  Surgeon: Beverley Fiedler, MD;  Location: [facility] ENDOSCOPY;  Service: Gastroenterology;;   SHOULDER SURGERY  2006   Family History  Problem Relation Age of  Onset   Breast cancer Mother    Healthy Son    Healthy Son    Parkinson's disease Sister    Colon cancer Neg Hx    Esophageal cancer Neg Hx    Rectal cancer Neg Hx    Stomach cancer Neg Hx    Social History   Socioeconomic History   Marital status: Married    Spouse name: Not on file   Number of children: 3   Years of education: Not on file   Highest education level: Not on file  Occupational History   Occupation: Auditor  Tobacco Use   Smoking status: Former    Types: Cigarettes    Quit date: 11/02/1999    Years since quitting: 23.4   Smokeless tobacco: Never  Vaping Use   Vaping Use: Never used  Substance and Sexual Activity   Alcohol use: No  Drug use: No   Sexual activity: Not Currently    Partners: Female  Other Topics Concern   Not on file  Social History Narrative   Daily caffeine       Right handed      Lives with wife in a one story home      Social Determinants of Health   Financial Resource Strain: Low Risk  (04/27/2023)   Overall Financial Resource Strain (CARDIA)    Difficulty of Paying Living Expenses: Not hard at all  Food Insecurity: No Food Insecurity (04/27/2023)   Hunger Vital Sign    Worried About Running Out of Food in the Last Year: Never true    Ran Out of Food in the Last Year: Never true  Transportation Needs: No Transportation Needs (04/27/2023)   PRAPARE - Administrator, Civil Service (Medical): No    Lack of Transportation (Non-Medical): No  Physical Activity: Insufficiently Active (04/27/2023)   Exercise Vital Sign    Days of Exercise per Week: 3 days    Minutes of Exercise per Session: 30 min  Stress: No Stress Concern Present (04/27/2023)   [employer] of Occupational Health - Occupational Stress Questionnaire    Feeling of Stress : Not at all  Social Connections: Socially Integrated (04/27/2023)   Social Connection and Isolation Panel [NHANES]    Frequency of Communication with Friends and Family: More than  three times a week    Frequency of Social Gatherings with Friends and Family: More than three times a week    Attends Religious Services: More than 4 times per year    Active Member of [employer] or Organizations: Yes    Attends Engineer, structural: More than 4 times per year    Marital Status: Married    Tobacco Counseling Counseling given: Not Answered   Clinical Intake:  Pre-visit preparation completed: No  Pain : No/denies pain     BMI - recorded: 20.99 Nutritional Status: BMI of 19-24  Normal Nutritional Risks: None Diabetes: Yes CBG done?: Yes (CBG 108 Taken by patient) CBG resulted in Enter/ Edit results?: Yes Did pt. bring in CBG monitor from home?: No  How often do you need to have someone help you when you read instructions, pamphlets, or other written materials from your doctor or pharmacy?: 1 - Never  Interpreter Needed?: No  Information entered by :: Theresa Mulligan LPN   Activities of Daily Living    04/27/2023   11:08 AM 02/22/2023    5:06 PM  In your present state of health, do you have any difficulty performing the following activities:  Hearing? 0 0  Vision? 0 0  Difficulty concentrating or making decisions? 0 0  Walking or climbing stairs? 0 0  Dressing or bathing? 0 0  Doing errands, shopping? 0 0  Preparing Food and eating ? N   Using the Toilet? N   In the past six months, have you accidently leaked urine? N   Do you have problems with loss of bowel control? N   Managing your Medications? N   Managing your Finances? N   Housekeeping or managing your Housekeeping? N     Patient Care Team: Kristian Covey, MD as PCP - General (Family Medicine) Van Clines, MD as Consulting Physician (Neurology) Dr [facility], OD (Optometry) Dahlia Byes, [facility] (Inactive) as Pharmacist (Pharmacist)  Indicate any recent Medical Services you may have received from other than [facility] providers in the past year (  date may be  approximate).     Assessment:   This is a routine wellness examination for [place].  Hearing/Vision screen Hearing Screening - Comments:: Denies hearing difficulties   Vision Screening - Comments:: Wears rx glasses - up to date with routine eye exams with  Dr Earlene Plater  Dietary issues and exercise activities discussed:     Goals Addressed               This Visit's Progress     Patient Stated (pt-stated)        Maintain current health.       Depression Screen    04/27/2023   11:07 AM 02/16/2023   10:15 AM 12/08/2022   11:14 AM 04/23/2022   10:32 AM 12/07/2021   11:01 AM 04/22/2021    1:19 PM 04/22/2021    1:13 PM  PHQ 2/9 Scores  PHQ - 2 Score 0 4 2 0 0 0 0  PHQ- 9 Score  18 5        Fall Risk    04/27/2023   11:08 AM 02/16/2023   10:15 AM 12/08/2022   11:13 AM 10/29/2022   11:20 AM 04/27/2022   11:23 AM  Fall Risk   Falls in the past year? 0 1 0 0 1  Number falls in past yr: 0 1 0 0 1  Injury with Fall? 0 0 0 0 0  Risk for fall due to : No Fall Risks History of fall(s);Impaired balance/gait No Fall Risks    Follow up Falls prevention discussed Falls evaluation completed Falls evaluation completed Falls evaluation completed     MEDICARE RISK AT HOME:  Medicare Risk at Home - 04/27/23 1117     Any stairs in or around the home? No    If so, are there any without handrails? No    Home free of loose throw rugs in walkways, pet beds, electrical cords, etc? Yes    Adequate lighting in your home to reduce risk of falls? Yes    Life alert? No    Use of a cane, walker or w/c? Yes    Grab bars in the bathroom? Yes    Shower chair or bench in shower? Yes    Elevated toilet seat or a handicapped toilet? No             TIMED UP AND GO:  Was the test performed?  No    Cognitive Function:    10/13/2021    3:00 PM 01/14/2021   10:00 AM 04/19/2019    7:05 PM  MMSE - Mini Mental State Exam  Orientation to time 4 5 5   Orientation to Place 5 5 5   Registration 3 3 3    Attention/ Calculation 4 4 3   Recall 2 2 3   Language- name 2 objects 2 2 2   Language- repeat 1 1 1   Language- follow 3 step command 3 3 3   Language- read & follow direction 1 1 1   Write a sentence 1 1 1   Copy design 0 1 1  Total score 26 28 28       10/08/2019   11:00 AM 06/08/2019   10:00 AM  Montreal Cognitive Assessment   Visuospatial/ Executive (0/5)  2  Naming (0/3)  2  Attention: Read list of digits (0/2) 1 2  Attention: Read list of letters (0/1) 1 1  Attention: Serial 7 subtraction starting at 100 (0/3) 3 3  Language: Repeat phrase (0/2) 1 1  Language : Fluency (  0/1) 0 0  Abstraction (0/2) 1 0  Delayed Recall (0/5) 0 3  Orientation (0/6) 6 6  Total  20  Adjusted Score (based on education)  21      04/27/2023   11:09 AM 04/23/2022   10:39 AM 04/14/2020    3:08 PM  6CIT Screen  What Year? 0 points 0 points 4 points  What month? 0 points 0 points 0 points  What time? 0 points 0 points 0 points  Count back from 20 0 points 0 points 0 points  Months in reverse 0 points 0 points 2 points  Repeat phrase 0 points 0 points 0 points  Total Score 0 points 0 points 6 points    Immunizations Immunization History  Administered Date(s) Administered   Fluad Quad(high Dose 65+) 07/18/2020, 10/07/2022   Influenza Split 08/16/2011   Influenza Whole 08/01/2006, 07/31/2008, 09/17/2009, 07/15/2010   Influenza, High Dose Seasonal PF 10/02/2014, 12/11/2015, 10/06/2016, 08/04/2017, 07/20/2018, 07/28/2019   Influenza,inj,Quad PF,6+ Mos 10/02/2013   Moderna Sars-Covid-2 Vaccination 12/13/2019, 01/10/2020   Pneumococcal Conjugate-13 04/02/2014   Pneumococcal Polysaccharide-23 08/01/2006, 03/12/2011   Td 03/16/2012   Tdap 03/16/2012   Zoster, Live 10/11/2011      Flu Vaccine status: Up to date  Pneumococcal vaccine status: Up to date  Covid-19 vaccine status: Completed vaccines  Qualifies for Shingles Vaccine? Yes   Zostavax completed No   Shingrix Completed?: No.     Education has been provided regarding the importance of this vaccine. Patient has been advised to call insurance company to determine out of pocket expense if they have not yet received this vaccine. Advised may also receive vaccine at local pharmacy or Health Dept. Verbalized acceptance and understanding.  Screening Tests Health Maintenance  Topic Date Due   Diabetic kidney evaluation - Urine ACR  02/03/2019   COVID-19 Vaccine (3 - 2023-24 season) 05/13/2023 (Originally 07/02/2022)   Zoster Vaccines- Shingrix (1 of 2) 11/04/2023 (Originally 05/02/1995)   INFLUENZA VACCINE  06/02/2023   HEMOGLOBIN A1C  06/08/2023   OPHTHALMOLOGY EXAM  10/05/2023   FOOT EXAM  12/09/2023   Diabetic kidney evaluation - eGFR measurement  03/09/2024   Medicare Annual Wellness (AWV)  04/26/2024   Pneumonia Vaccine 14+ Years old  Completed   Hepatitis C Screening  Completed   HPV VACCINES  Aged Out   DTaP/Tdap/Td  Discontinued   Colonoscopy  Discontinued    Health Maintenance  Health Maintenance Due  Topic Date Due   Diabetic kidney evaluation - Urine ACR  02/03/2019    Colorectal cancer screening: No longer required.   Lung Cancer Screening: (Low Dose CT Chest recommended if Age 9-80 years, 20 pack-year currently smoking OR have quit w/in 15years.) does not qualify.     Additional Screening:  Hepatitis C Screening: does qualify; Completed 02/02/18  Vision Screening: Recommended annual ophthalmology exams for early detection of glaucoma and other disorders of the eye. Is the patient up to date with their annual eye exam?  Yes  Who is the provider or what is the name of the office in which the patient attends annual eye exams? Dr Earlene Plater If pt is not established with a provider, would they like to be referred to a provider to establish care? No .   Dental Screening: Recommended annual dental exams for proper oral hygiene  Diabetic Foot Exam: Diabetic Foot Exam: Overdue, Pt has been advised about the  importance in completing this exam. Pt is scheduled for diabetic foot exam on Followed by PCP.  Community Resource Referral / Chronic Care Management:  CRR required this visit?  No   CCM required this visit?  No     Plan:     I have personally reviewed and noted the following in the patient's chart:   Medical and social history Use of alcohol, tobacco or illicit drugs  Current medications and supplements including opioid prescriptions. Patient is not currently taking opioid prescriptions. Functional ability and status Nutritional status Physical activity Advanced directives List of other physicians Hospitalizations, surgeries, and ER visits in previous 12 months Vitals Screenings to include cognitive, depression, and falls Referrals and appointments  In addition, I have reviewed and discussed with patient certain preventive protocols, quality metrics, and best practice recommendations. A written personalized care plan for preventive services as well as general preventive health recommendations were provided to patient.     Tillie Rung, LPN   5/85/[id number]   After Visit Summary: (MyChart) Due to this being a telephonic visit, the after visit summary with patients personalized plan was offered to patient via MyChart   Nurse Notes: Patient due Diabetic kidney evaluation-Urine ACR

## 2023-05-11 ENCOUNTER — Telehealth: Payer: Self-pay | Admitting: Neurology

## 2023-05-11 NOTE — Telephone Encounter (Signed)
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 His condition you see him for is still about the same.

## 2023-05-11 NOTE — Telephone Encounter (Signed)
Patients wife said she needs to cancel his appt for 05/12/23 but needs to talk about his condition. She needs a call back

## 2023-05-11 NOTE — Telephone Encounter (Signed)
Did they just want to reschedule when he is discharged from the hospital? Anything else we can help them with? Thanks

## 2023-05-12 ENCOUNTER — Ambulatory Visit: Payer: Medicare HMO | Admitting: Neurology

## 2023-05-18 ENCOUNTER — Ambulatory Visit: Payer: Medicare HMO | Admitting: Vascular Surgery

## 2023-05-19 DIAGNOSIS — L821 Other seborrheic keratosis: Secondary | ICD-10-CM | POA: Diagnosis not present

## 2023-05-19 DIAGNOSIS — D692 Other nonthrombocytopenic purpura: Secondary | ICD-10-CM | POA: Diagnosis not present

## 2023-05-19 DIAGNOSIS — L738 Other specified follicular disorders: Secondary | ICD-10-CM | POA: Diagnosis not present

## 2023-05-19 DIAGNOSIS — Z85828 Personal history of other malignant neoplasm of skin: Secondary | ICD-10-CM | POA: Diagnosis not present

## 2023-05-19 DIAGNOSIS — L718 Other rosacea: Secondary | ICD-10-CM | POA: Diagnosis not present

## 2023-05-25 ENCOUNTER — Ambulatory Visit: Payer: Medicare HMO | Admitting: Vascular Surgery

## 2023-05-25 ENCOUNTER — Encounter: Payer: Self-pay | Admitting: Vascular Surgery

## 2023-05-25 VITALS — BP 137/81 | HR 108 | Temp 98.2°F | Resp 20 | Ht 67.0 in | Wt 127.0 lb

## 2023-05-25 DIAGNOSIS — I714 Abdominal aortic aneurysm, without rupture, unspecified: Secondary | ICD-10-CM | POA: Diagnosis not present

## 2023-05-25 NOTE — Progress Notes (Signed)
Patient ID: Walter [name], male   DOB: Nov 24, 1945, 77 y.o.   MRN: [id number]  Reason for Consult: New Patient (Initial Visit)   Referred by Kristian Covey, MD  Subjective:     HPI:  Walter [name] is a 77 y.o. male here for follow-up aortic aneurysm originally evaluated in the hospital at the time of a fall with compression fracture.  Ultimately he had IVC filter placed for small DVT with PE with inability to tolerate anticoagulation.  He remains without anticoagulation.  According to his wife he has continued to go downhill with weakness and weight loss now weighing 112 lbs.  He does continue to walk but needs assistance with most all of his activities of daily living at this time due to his Parkinson's disease.  Past Medical History:  Diagnosis Date   Allergy    Cancer (HCC)    Skin cancer   Cataract    Coronary artery disease    Diabetes mellitus    Diverticulosis    Esophageal stricture    GERD (gastroesophageal reflux disease)    Hyperlipidemia    Hypertension    Peripheral vascular disease (HCC)    Family History  Problem Relation Age of Onset   Breast cancer Mother    Healthy Son    Healthy Son    Parkinson's disease Sister    Colon cancer Neg Hx    Esophageal cancer Neg Hx    Rectal cancer Neg Hx    Stomach cancer Neg Hx    Past Surgical History:  Procedure Laterality Date   BIOPSY  03/07/2023   Procedure: BIOPSY;  Surgeon: Beverley Fiedler, MD;  Location: [facility] ENDOSCOPY;  Service: Gastroenterology;;   COLONOSCOPY WITH PROPOFOL N/A 03/07/2023   Procedure: COLONOSCOPY WITH PROPOFOL;  Surgeon: Beverley Fiedler, MD;  Location: [facility] ENDOSCOPY;  Service: Gastroenterology;  Laterality: N/A;   CORONARY ANGIOPLASTY WITH STENT PLACEMENT     ESOPHAGOGASTRODUODENOSCOPY  06/07/2012   Procedure: ESOPHAGOGASTRODUODENOSCOPY (EGD);  Surgeon: Hart Carwin, MD;  Location: [facility] ENDOSCOPY;  Service: Endoscopy;  Laterality: N/A;   ESOPHAGOGASTRODUODENOSCOPY (EGD) WITH PROPOFOL N/A  03/07/2023   Procedure: ESOPHAGOGASTRODUODENOSCOPY (EGD) WITH PROPOFOL;  Surgeon: Beverley Fiedler, MD;  Location: [facility] ENDOSCOPY;  Service: Gastroenterology;  Laterality: N/A;   HOT HEMOSTASIS N/A 03/07/2023   Procedure: HOT HEMOSTASIS (ARGON PLASMA COAGULATION/BICAP);  Surgeon: Beverley Fiedler, MD;  Location: [facility] ENDOSCOPY;  Service: Gastroenterology;  Laterality: N/A;   IR KYPHO LUMBAR INC FX REDUCE BONE BX UNI/BIL CANNULATION INC/IMAGING  03/02/2023   IVC FILTER INSERTION N/A 03/09/2023   Procedure: IVC FILTER INSERTION;  Surgeon: Victorino Sparrow, MD;  Location: [facility] INVASIVE CV LAB;  Service: Cardiovascular;  Laterality: N/A;   POLYPECTOMY  03/07/2023   Procedure: POLYPECTOMY;  Surgeon: Beverley Fiedler, MD;  Location: [facility] ENDOSCOPY;  Service: Gastroenterology;;   SHOULDER SURGERY  2006    Short Social History:  Social History   Tobacco Use   Smoking status: Former    Current packs/day: 0.00    Types: Cigarettes    Quit date: 11/02/1999    Years since quitting: 23.5   Smokeless tobacco: Never  Substance Use Topics   Alcohol use: No    No Known Allergies  Current Outpatient Medications  Medication Sig Dispense Refill   amLODipine (NORVASC) 5 MG tablet Take 1 tablet by mouth once daily (Patient taking differently: Take 5 mg by mouth daily.) 90 tablet 0   atorvastatin (LIPITOR) 40 MG tablet Take 1 tablet  by mouth once daily 90 tablet 1   Blood Glucose Monitoring Suppl (ONE TOUCH ULTRA 2) w/Device KIT 1 kit by Does not apply route daily. 1 kit 0   cyanocobalamin 2000 MCG tablet Take 1 tablet (2,000 mcg total) by mouth daily. 30 tablet 0   Dihydroxyaluminum Sod Carb (ROLAIDS PO) Take 1 tablet by mouth as needed (heartburn).     diphenhydrAMINE (BENADRYL) 50 MG tablet Take 0.5 tablets (25 mg total) by mouth every 8 (eight) hours as needed for itching.     docusate sodium (COLACE) 100 MG capsule Take 1 capsule (100 mg total) by mouth 2 (two) times daily. 10 capsule 0   donepezil (ARICEPT) 10 MG tablet Take  1 tablet daily (Patient taking differently: Take 10 mg by mouth daily.) 90 tablet 3   feeding supplement (ENSURE ENLIVE / ENSURE PLUS) LIQD Take 237 mLs by mouth 2 (two) times daily between meals. 237 mL 12   Iron, Ferrous Sulfate, 325 (65 Fe) MG TABS Take 325 mg by mouth daily. 30 tablet 3   Lancets (ONETOUCH ULTRASOFT) lancets Check blood sugars once daily. Dx: E11.51 100 each 12   lidocaine 4 % Place 1 patch onto the skin daily as needed (pain).     losartan (COZAAR) 100 MG tablet Take 1 tablet by mouth once daily 90 tablet 1   megestrol (MEGACE) 40 MG tablet Take 1 tablet (40 mg total) by mouth daily. 90 tablet 0   memantine (NAMENDA) 10 MG tablet Take 1 tablet twice a day (Patient taking differently: Take 10 mg by mouth 2 (two) times daily.) 180 tablet 3   Multiple Vitamin (MULTIVITAMIN WITH MINERALS) TABS tablet Take 1 tablet by mouth daily.     ONETOUCH ULTRA test strip USE 1 STRIP TO CHECK GLUCOSE ONCE DAILY 100 each 2   OVER THE COUNTER MEDICATION Take 1 tablet by mouth daily. Allergy relief     pantoprazole (PROTONIX) 40 MG tablet Take 1 tablet (40 mg total) by mouth daily.     No current facility-administered medications for this visit.    Review of Systems  Constitutional: Positive for unexpected weight change.  HENT: HENT negative.  Eyes: Eyes negative.  Respiratory: Respiratory negative.  Cardiovascular: Cardiovascular negative.  GI: Gastrointestinal negative.  Musculoskeletal: Musculoskeletal negative.  Skin: Skin negative.  Neurological: Positive for focal weakness.  Hematologic: Hematologic/lymphatic negative.  Psychiatric: Psychiatric negative.        Objective:  Objective  Vitals:   05/25/23 1558  BP: 137/81  Pulse: (!) 108  Resp: 20  Temp: 98.2 F (36.8 C)  SpO2: 94%    Physical Exam HENT:     Head: Normocephalic.     Mouth/Throat:     Mouth: Mucous membranes are moist.  Eyes:     Pupils: Pupils are equal, round, and reactive to light.   Cardiovascular:     Rate and Rhythm: Normal rate.     Pulses:          Femoral pulses are 0 on the right side and 2+ on the left side. Pulmonary:     Effort: Pulmonary effort is normal.  Abdominal:     General: Abdomen is flat.     Palpations: Abdomen is soft.  Musculoskeletal:        General: Normal range of motion.     Right lower leg: No edema.     Left lower leg: No edema.  Skin:    General: Skin is warm.     Capillary Refill:  Capillary refill takes less than 2 seconds.  Neurological:     General: No focal deficit present.     Mental Status: He is alert.  Psychiatric:        Mood and Affect: Mood normal.        Thought Content: Thought content normal.        Judgment: Judgment normal.     Data: CTA IMPRESSION: VASCULAR   5.9 cm infrarenal abdominal aortic aneurysm with extensive mural plaque. Aneurysm terminates at the aortic bifurcation.   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 There appears to be reconstitution of the right superficial femoral and deep femoral arteries proximally.   Diffuse aortoiliac and branch vessel atherosclerosis.   NON-VASCULAR   Colonic diverticulosis.   No acute findings   Age-indeterminate moderate L1 compression fracture     Assessment/Plan:    77 year old male with a 6 cm aneurysm by recent CT scan when he was in the hospital.  Unfortunately he continues to decline requiring help with all of his activities of daily living and he is losing weight despite healthy appetite.  I discussed with the patient and his wife that I do not think at this time we should pursue repair of aneurysm in an effort to prevent rupture as I do not think he would tolerate surgery well and would at the very best require postoperative SNF placement and possibly may never return to normal.  They demonstrate good understanding.  We also discussed the signs and symptoms of aneurysm rupture for which to seek emergent medical  attention and they demonstrate understanding.  Short of that I will see him back in 3 months with repeat CT scan to evaluate the aneurysm and the patient's progress.  More than likely he will not require repair.    Maeola Harman MD [facility]

## 2023-05-26 ENCOUNTER — Other Ambulatory Visit: Payer: Self-pay | Admitting: Family Medicine

## 2023-05-26 DIAGNOSIS — I10 Essential (primary) hypertension: Secondary | ICD-10-CM

## 2023-05-30 ENCOUNTER — Encounter: Payer: Self-pay | Admitting: Family Medicine

## 2023-05-30 ENCOUNTER — Ambulatory Visit (INDEPENDENT_AMBULATORY_CARE_PROVIDER_SITE_OTHER): Payer: Medicare HMO | Admitting: Family Medicine

## 2023-05-30 VITALS — BP 126/70 | HR 105 | Temp 97.5°F | Ht 67.0 in | Wt 125.1 lb

## 2023-05-30 DIAGNOSIS — R634 Abnormal weight loss: Secondary | ICD-10-CM

## 2023-05-30 DIAGNOSIS — E43 Unspecified severe protein-calorie malnutrition: Secondary | ICD-10-CM | POA: Diagnosis not present

## 2023-05-30 DIAGNOSIS — D649 Anemia, unspecified: Secondary | ICD-10-CM

## 2023-05-30 DIAGNOSIS — R197 Diarrhea, unspecified: Secondary | ICD-10-CM

## 2023-05-30 DIAGNOSIS — D509 Iron deficiency anemia, unspecified: Secondary | ICD-10-CM

## 2023-05-30 DIAGNOSIS — E611 Iron deficiency: Secondary | ICD-10-CM

## 2023-05-30 LAB — IBC PANEL
Iron: 54 ug/dL (ref 42–165)
Saturation Ratios: 19.2 % — ABNORMAL LOW (ref 20.0–50.0)
TIBC: 281.4 ug/dL (ref 250.0–450.0)
Transferrin: 201 mg/dL — ABNORMAL LOW (ref 212.0–360.0)

## 2023-05-30 LAB — CBC WITH DIFFERENTIAL/PLATELET
Basophils Absolute: 0 10*3/uL (ref 0.0–0.1)
Basophils Relative: 0.6 % (ref 0.0–3.0)
Eosinophils Absolute: 0.2 10*3/uL (ref 0.0–0.7)
Eosinophils Relative: 2.5 % (ref 0.0–5.0)
HCT: 36.8 % — ABNORMAL LOW (ref 39.0–52.0)
Hemoglobin: 11.8 g/dL — ABNORMAL LOW (ref 13.0–17.0)
Lymphocytes Relative: 20.9 % (ref 12.0–46.0)
Lymphs Abs: 1.5 10*3/uL (ref 0.7–4.0)
MCHC: 32 g/dL (ref 30.0–36.0)
MCV: 88.8 fl (ref 78.0–100.0)
Monocytes Absolute: 0.4 10*3/uL (ref 0.1–1.0)
Monocytes Relative: 5.6 % (ref 3.0–12.0)
Neutro Abs: 5.2 10*3/uL (ref 1.4–7.7)
Neutrophils Relative %: 70.4 % (ref 43.0–77.0)
Platelets: 220 10*3/uL (ref 150.0–400.0)
RBC: 4.14 Mil/uL — ABNORMAL LOW (ref 4.22–5.81)
RDW: 15.7 % — ABNORMAL HIGH (ref 11.5–15.5)
WBC: 7.3 10*3/uL (ref 4.0–10.5)

## 2023-05-30 LAB — COMPREHENSIVE METABOLIC PANEL
ALT: 14 U/L (ref 0–53)
AST: 15 U/L (ref 0–37)
Albumin: 4.2 g/dL (ref 3.5–5.2)
Alkaline Phosphatase: 86 U/L (ref 39–117)
BUN: 19 mg/dL (ref 6–23)
CO2: 21 mEq/L (ref 19–32)
Calcium: 9.2 mg/dL (ref 8.4–10.5)
Chloride: 105 mEq/L (ref 96–112)
Creatinine, Ser: 1.02 mg/dL (ref 0.40–1.50)
GFR: 70.61 mL/min (ref >60.00)
Glucose, Bld: 107 mg/dL — ABNORMAL HIGH (ref 70–99)
Potassium: 4.2 mEq/L (ref 3.5–5.1)
Sodium: 140 mEq/L (ref 135–145)
Total Bilirubin: 0.3 mg/dL (ref 0.2–1.2)
Total Protein: 7.3 g/dL (ref 6.0–8.3)

## 2023-05-30 NOTE — Progress Notes (Signed)
Established Patient Office Visit  Subjective   Patient ID: Walter [name], male    DOB: 06/24/1946  Age: 77 y.o. MRN: [id number]  Chief Complaint  Patient presents with   Fatigue   Back Pain   Stool Color Change    HPI   Walter [name] is seen today accompanied by his wife Rhunette Croft.  He has complicated past medical history with multiple chronic problems including abdominal aortic aneurysm, history of acute pulmonary embolism, angiodysplasia of small intestine, type 2 diabetes, hypertension, peripheral vascular disease, GERD, esophageal stricture, Parkinson's disease with cognitive changes, history of L1 compression fracture status post kyphoplasty, progressive weight loss with protein calorie malnutrition, iron deficiency anemia.  He had lengthy admission April 22 through May 9 after L1 compression fracture.  Further imaging revealed abdominal aortic aneurysm measuring 6 cm and infrarenal.  Patient has been seen by vascular surgery and felt to be a very poor surgical candidate.  Further workup also revealed DVT right posterior tibial vein and small pulmonary embolus right lower lobe.  In view of GI bleed was felt to be high risk for anticoagulation and IVC filter was placed.  Acute on likely chronic anemia.  EGD revealed angiodysplasia of the duodenum which was cauterized.  Colonoscopy revealed benign polyps and some diverticulosis without active bleeding.  He did receive 2 blood transfusions.  Recent repeat hgb in June 11.3.     He has continued to decline in terms of generalized weakness and still eating but not eating great quantities.  Denies any swallowing difficulties.  Have been tried on Megace 40 mg without much improvement.  Recent TSH and B12 normal.  Does relate about 3-4 loose stools a day and especially after eating.  No history of known lactose intolerance or gluten sensitivity.  He has been taking Ensure but they feel like that worsens his diarrhea.  Past Medical History:  Diagnosis  Date   Allergy    Cancer [facility])    Skin cancer   Cataract    Coronary artery disease    Diabetes mellitus    Diverticulosis    Esophageal stricture    GERD (gastroesophageal reflux disease)    Hyperlipidemia    Hypertension    Peripheral vascular disease (HCC)    Past Surgical History:  Procedure Laterality Date   BIOPSY  03/07/2023   Procedure: BIOPSY;  Surgeon: Beverley Fiedler, MD;  Location: [facility] ENDOSCOPY;  Service: Gastroenterology;;   COLONOSCOPY WITH PROPOFOL N/A 03/07/2023   Procedure: COLONOSCOPY WITH PROPOFOL;  Surgeon: Beverley Fiedler, MD;  Location: [facility] ENDOSCOPY;  Service: Gastroenterology;  Laterality: N/A;   CORONARY ANGIOPLASTY WITH STENT PLACEMENT     ESOPHAGOGASTRODUODENOSCOPY  06/07/2012   Procedure: ESOPHAGOGASTRODUODENOSCOPY (EGD);  Surgeon: Hart Carwin, MD;  Location: [facility] ENDOSCOPY;  Service: Endoscopy;  Laterality: N/A;   ESOPHAGOGASTRODUODENOSCOPY (EGD) WITH PROPOFOL N/A 03/07/2023   Procedure: ESOPHAGOGASTRODUODENOSCOPY (EGD) WITH PROPOFOL;  Surgeon: Beverley Fiedler, MD;  Location: [facility] ENDOSCOPY;  Service: Gastroenterology;  Laterality: N/A;   HOT HEMOSTASIS N/A 03/07/2023   Procedure: HOT HEMOSTASIS (ARGON PLASMA COAGULATION/BICAP);  Surgeon: Beverley Fiedler, MD;  Location: [facility] ENDOSCOPY;  Service: Gastroenterology;  Laterality: N/A;   IR KYPHO LUMBAR INC FX REDUCE BONE BX UNI/BIL CANNULATION INC/IMAGING  03/02/2023   IVC FILTER INSERTION N/A 03/09/2023   Procedure: IVC FILTER INSERTION;  Surgeon: Victorino Sparrow, MD;  Location: [facility] INVASIVE CV LAB;  Service: Cardiovascular;  Laterality: N/A;   POLYPECTOMY  03/07/2023   Procedure: POLYPECTOMY;  Surgeon: Erick Blinks  M, MD;  Location: [facility] ENDOSCOPY;  Service: Gastroenterology;;   SHOULDER SURGERY  2006    reports that he quit smoking about 23 years ago. His smoking use included cigarettes. He has never used smokeless tobacco. He reports that he does not drink alcohol and does not use drugs. family history includes Breast cancer in his  mother; Healthy in his son and son; Parkinson's disease in his sister. No Known Allergies  Review of Systems  Constitutional:  Positive for malaise/fatigue and weight loss. Negative for fever.  Respiratory:  Negative for cough and shortness of breath.   Cardiovascular:  Negative for chest pain.  Gastrointestinal:  Positive for diarrhea. Negative for abdominal pain and constipation.  Genitourinary:  Negative for dysuria.  Neurological:  Negative for loss of consciousness.      Objective:     BP 126/70 (BP Location: Left Arm, Patient Position: Sitting, Cuff Size: Normal)   Pulse (!) 105   Temp (!) 97.5 F (36.4 C) (Oral)   Ht 5\' 7"  (1.702 m)   Wt 125 lb 1.6 oz (56.7 kg)   SpO2 98%   BMI 19.59 kg/m  BP Readings from Last 3 Encounters:  05/30/23 126/70  05/25/23 137/81  03/22/23 110/68   Wt Readings from Last 3 Encounters:  05/30/23 125 lb 1.6 oz (56.7 kg)  05/25/23 127 lb (57.6 kg)  04/27/23 134 lb (60.8 kg)      Physical Exam Vitals reviewed.  Constitutional:      Comments: Thin somewhat cachectic appearing 77 year old male  Cardiovascular:     Rate and Rhythm: Normal rate and regular rhythm.  Pulmonary:     Effort: Pulmonary effort is normal.     Breath sounds: Normal breath sounds.  Abdominal:     General: There is no distension.     Palpations: Abdomen is soft.     Tenderness: There is no abdominal tenderness.  Musculoskeletal:     Right lower leg: No edema.     Left lower leg: No edema.  Neurological:     Mental Status: He is alert.  Psychiatric:        Mood and Affect: Mood normal.      Results for orders placed or performed in visit on 05/30/23  CMP  Result Value Ref Range   Sodium 140 135 - 145 mEq/L   Potassium 4.2 3.5 - 5.1 mEq/L   Chloride 105 96 - 112 mEq/L   CO2 21 19 - 32 mEq/L   Glucose, Bld 107 (H) 70 - 99 mg/dL   BUN 19 6 - 23 mg/dL   Creatinine, Ser 1.61 0.40 - 1.50 mg/dL   Total Bilirubin 0.3 0.2 - 1.2 mg/dL   Alkaline  Phosphatase 86 39 - 117 U/L   AST 15 0 - 37 U/L   ALT 14 0 - 53 U/L   Total Protein 7.3 6.0 - 8.3 g/dL   Albumin 4.2 3.5 - 5.2 g/dL   GFR 09.60 >45.40 mL/min   Calcium 9.2 8.4 - 10.5 mg/dL  CBC with Differential/Platelet  Result Value Ref Range   WBC 7.3 4.0 - 10.5 K/uL   RBC 4.14 (L) 4.22 - 5.81 Mil/uL   Hemoglobin 11.8 (L) 13.0 - 17.0 g/dL   HCT 98.1 (L) 19.1 - 47.8 %   MCV 88.8 78.0 - 100.0 fl   MCHC 32.0 30.0 - 36.0 g/dL   RDW 29.5 (H) 62.1 - 30.8 %   Platelets 220.0 150.0 - 400.0 K/uL   Neutrophils Relative % 70.4  43.0 - 77.0 %   Lymphocytes Relative 20.9 12.0 - 46.0 %   Monocytes Relative 5.6 3.0 - 12.0 %   Eosinophils Relative 2.5 0.0 - 5.0 %   Basophils Relative 0.6 0.0 - 3.0 %   Neutro Abs 5.2 1.4 - 7.7 K/uL   Lymphs Abs 1.5 0.7 - 4.0 K/uL   Monocytes Absolute 0.4 0.1 - 1.0 K/uL   Eosinophils Absolute 0.2 0.0 - 0.7 K/uL   Basophils Absolute 0.0 0.0 - 0.1 K/uL  IBC Panel([facility])  Result Value Ref Range   Iron 54 42 - 165 ug/dL   Transferrin 161.0 (L) 212.0 - 360.0 mg/dL   Saturation Ratios 96.0 (L) 20.0 - 50.0 %   TIBC 281.4 250.0 - 450.0 mcg/dL    Last CBC Lab Results  Component Value Date   WBC 7.3 05/30/2023   HGB 11.8 (L) 05/30/2023   HCT 36.8 (L) 05/30/2023   MCV 88.8 05/30/2023   MCH 26.9 04/18/2023   RDW 15.7 (H) 05/30/2023   PLT 220.0 05/30/2023   Last metabolic panel Lab Results  Component Value Date   GLUCOSE 107 (H) 05/30/2023   NA 140 05/30/2023   K 4.2 05/30/2023   CL 105 05/30/2023   CO2 21 05/30/2023   BUN 19 05/30/2023   CREATININE 1.02 05/30/2023   GFR 70.61 05/30/2023   CALCIUM 9.2 05/30/2023   PHOS 3.6 02/22/2023   PROT 7.3 05/30/2023   ALBUMIN 4.2 05/30/2023   BILITOT 0.3 05/30/2023   ALKPHOS 86 05/30/2023   AST 15 05/30/2023   ALT 14 05/30/2023   ANIONGAP 7 03/10/2023   Last hemoglobin A1c Lab Results  Component Value Date   HGBA1C 5.8 (A) 12/08/2022   Last thyroid functions Lab Results  Component Value Date    TSH 0.65 02/16/2023   Last vitamin B12 and Folate Lab Results  Component Value Date   VITAMINB12 678 02/23/2023   FOLATE 13.2 02/23/2023      The 10-year ASCVD risk score (Arnett DK, et al., 2019) is: 49.3%    Assessment & Plan:   #1 progressive weight loss and protein calorie malnutrition.  They do relate that he is having frequent loose stools especially after meals.  No known history of pancreatitis.  Check fecal pancreatic elastase.  Also recheck CBC and CMP. -We did discuss possible other protein supplements and ways to try to increase his overall calorie intake  #2 abdominal aortic aneurysm 6 cm infrarenal.  Patient is a very poor surgical candidate and family understands  #3 iron deficiency anemia.  History of angiodysplasia of duodenum.  Recheck CBC today.  Patient remains on iron replacement.  #4 history of recent DVT and pulmonary embolus.  IVC filter in place. -Recommend discontinue Megace which can have prothrombotic effect- and has apparently not been helping his appetite.    #5 history of type 2 diabetes which has been well-controlled.  #6 Parkinson's disease with cognitive impairment.  Followed by neurology.   No follow-ups on file.    Evelena Peat, MD

## 2023-05-30 NOTE — Patient Instructions (Signed)
Consider protein supplement such as Iso100 hydrolyzed protein powder.

## 2023-06-07 ENCOUNTER — Telehealth: Payer: Self-pay | Admitting: Family Medicine

## 2023-06-07 NOTE — Telephone Encounter (Signed)
Pt is scheduled to come in on 06/14/23. Family is having a difficult time collecting Pt's stool sample. They are asking for some advice. Please return call, at your earliest convenience.

## 2023-06-07 NOTE — Telephone Encounter (Signed)
I spoke with the patient's wife Rhunette Croft and she reported that they have been trying to collect stool sample but have been unable to due to patient having loose stool or being unable to go. Rhunette Croft reported she rescheduled the patient's visit for 8/13 and hopefully can obtain sample by then.

## 2023-06-08 ENCOUNTER — Ambulatory Visit: Payer: Medicare HMO | Admitting: Family Medicine

## 2023-06-08 NOTE — Telephone Encounter (Signed)
Wife is aware

## 2023-06-09 ENCOUNTER — Other Ambulatory Visit: Payer: Self-pay | Admitting: Family Medicine

## 2023-06-09 DIAGNOSIS — E1151 Type 2 diabetes mellitus with diabetic peripheral angiopathy without gangrene: Secondary | ICD-10-CM

## 2023-06-14 ENCOUNTER — Ambulatory Visit (INDEPENDENT_AMBULATORY_CARE_PROVIDER_SITE_OTHER): Payer: Medicare HMO | Admitting: Family Medicine

## 2023-06-14 ENCOUNTER — Other Ambulatory Visit: Payer: Self-pay

## 2023-06-14 VITALS — BP 118/60 | HR 103 | Temp 97.5°F | Ht 67.0 in | Wt 126.4 lb

## 2023-06-14 DIAGNOSIS — G20A1 Parkinson's disease without dyskinesia, without mention of fluctuations: Secondary | ICD-10-CM | POA: Diagnosis not present

## 2023-06-14 DIAGNOSIS — R197 Diarrhea, unspecified: Secondary | ICD-10-CM

## 2023-06-14 DIAGNOSIS — R634 Abnormal weight loss: Secondary | ICD-10-CM | POA: Diagnosis not present

## 2023-06-14 DIAGNOSIS — E1151 Type 2 diabetes mellitus with diabetic peripheral angiopathy without gangrene: Secondary | ICD-10-CM

## 2023-06-14 DIAGNOSIS — E43 Unspecified severe protein-calorie malnutrition: Secondary | ICD-10-CM | POA: Diagnosis not present

## 2023-06-14 DIAGNOSIS — D509 Iron deficiency anemia, unspecified: Secondary | ICD-10-CM | POA: Diagnosis not present

## 2023-06-14 DIAGNOSIS — R531 Weakness: Secondary | ICD-10-CM

## 2023-06-14 LAB — POCT GLYCOSYLATED HEMOGLOBIN (HGB A1C): Hemoglobin A1C: 5.6 % (ref 4.0–5.6)

## 2023-06-14 NOTE — Progress Notes (Signed)
Established Patient Office Visit  Subjective   Patient ID: Walter [name], male    DOB: [birth year]  Age: 77 y.o. MRN: [id number]  Chief Complaint  Patient presents with   Medical Management of Chronic Issues    HPI   Walter [name] is seen accompanied by wife Walter [name] for routine medical follow-up.  Refer to last note from couple weeks ago for details.  Recent hospitalizations with issues including L1 compression fracture, diagnosis of abdominal aortic aneurysm, DVT right posterior tibial vein and small pulmonary embolus right lower lobe, placement of IVC filter, acute on chronic anemia with EGD revealing angiodysplasia of the duodenum which was cauterized.  Colonoscopy revealed benign polyps with diverticulosis but no active bleeding.  Hemoglobin slowly improving since then.  Biggest challenge has been generalized weakness and poor appetite.  No nausea or vomiting.  Does have frequent diarrhea symptoms especially after eating.  We had ordered stool pancreatic elastase and they just returned stool specimen today.  Usually has 3-4 loose stools per day.  He briefly took some Megace prescribed by another provider without much improvement.  They are trying some protein supplement.  Weight is actually up 1 pound today.  He has benefited previously from home health physical therapy for strengthening and they would like to set this up.  He ambulates with a walker.  History of type 2 diabetes.  Currently off diabetic medications.  Blood sugars been very stable.  Past Medical History:  Diagnosis Date   Allergy    Cancer [facility])    Skin cancer   Cataract    Coronary artery disease    Diabetes mellitus    Diverticulosis    Esophageal stricture    GERD (gastroesophageal reflux disease)    Hyperlipidemia    Hypertension    Peripheral vascular disease (HCC)    Past Surgical History:  Procedure Laterality Date   BIOPSY  03/07/2023   Procedure: BIOPSY;  Surgeon: Beverley Fiedler, MD;  Location: [facility]  ENDOSCOPY;  Service: Gastroenterology;;   COLONOSCOPY WITH PROPOFOL N/A 03/07/2023   Procedure: COLONOSCOPY WITH PROPOFOL;  Surgeon: Beverley Fiedler, MD;  Location: [facility] ENDOSCOPY;  Service: Gastroenterology;  Laterality: N/A;   CORONARY ANGIOPLASTY WITH STENT PLACEMENT     ESOPHAGOGASTRODUODENOSCOPY  06/07/2012   Procedure: ESOPHAGOGASTRODUODENOSCOPY (EGD);  Surgeon: Hart Carwin, MD;  Location: [facility] ENDOSCOPY;  Service: Endoscopy;  Laterality: N/A;   ESOPHAGOGASTRODUODENOSCOPY (EGD) WITH PROPOFOL N/A 03/07/2023   Procedure: ESOPHAGOGASTRODUODENOSCOPY (EGD) WITH PROPOFOL;  Surgeon: Beverley Fiedler, MD;  Location: [facility] ENDOSCOPY;  Service: Gastroenterology;  Laterality: N/A;   HOT HEMOSTASIS N/A 03/07/2023   Procedure: HOT HEMOSTASIS (ARGON PLASMA COAGULATION/BICAP);  Surgeon: Beverley Fiedler, MD;  Location: [facility] ENDOSCOPY;  Service: Gastroenterology;  Laterality: N/A;   IR KYPHO LUMBAR INC FX REDUCE BONE BX UNI/BIL CANNULATION INC/IMAGING  03/02/2023   IVC FILTER INSERTION N/A 03/09/2023   Procedure: IVC FILTER INSERTION;  Surgeon: Victorino Sparrow, MD;  Location: [facility] INVASIVE CV LAB;  Service: Cardiovascular;  Laterality: N/A;   POLYPECTOMY  03/07/2023   Procedure: POLYPECTOMY;  Surgeon: Beverley Fiedler, MD;  Location: [facility] ENDOSCOPY;  Service: Gastroenterology;;   SHOULDER SURGERY  2006    reports that he quit smoking about 23 years ago. His smoking use included cigarettes. He has never used smokeless tobacco. He reports that he does not drink alcohol and does not use drugs. family history includes Breast cancer in his mother; Healthy in his son and son; Parkinson's disease in his sister. No Known  Allergies  Review of Systems  Constitutional:  Negative for chills and fever.  Respiratory:  Negative for cough and shortness of breath.   Cardiovascular:  Negative for chest pain.  Genitourinary:  Negative for dysuria.  Neurological:  Positive for weakness. Negative for loss of consciousness.      Objective:     BP  118/60 (BP Location: Left Arm, Patient Position: Sitting, Cuff Size: Normal)   Pulse (!) 103   Temp (!) 97.5 F (36.4 C) (Oral)   Ht 5\' 7"  (1.702 m)   Wt 126 lb 6.4 oz (57.3 kg)   SpO2 98%   BMI 19.80 kg/m  BP Readings from Last 3 Encounters:  06/14/23 118/60  05/30/23 126/70  05/25/23 137/81   Wt Readings from Last 3 Encounters:  06/14/23 126 lb 6.4 oz (57.3 kg)  05/30/23 125 lb 1.6 oz (56.7 kg)  05/25/23 127 lb (57.6 kg)      Physical Exam Vitals reviewed.  Cardiovascular:     Rate and Rhythm: Normal rate and regular rhythm.  Pulmonary:     Effort: Pulmonary effort is normal.     Breath sounds: Normal breath sounds.  Musculoskeletal:     Right lower leg: No edema.     Left lower leg: No edema.  Neurological:     Mental Status: He is alert.      Results for orders placed or performed in visit on 06/14/23  POC HgB A1c  Result Value Ref Range   Hemoglobin A1C 5.6 4.0 - 5.6 %   HbA1c POC (<> result, manual entry)     HbA1c, POC (prediabetic range)     HbA1c, POC (controlled diabetic range)      Last CBC Lab Results  Component Value Date   WBC 7.3 05/30/2023   HGB 11.8 (L) 05/30/2023   HCT 36.8 (L) 05/30/2023   MCV 88.8 05/30/2023   MCH 26.9 04/18/2023   RDW 15.7 (H) 05/30/2023   PLT 220.0 05/30/2023   Last metabolic panel Lab Results  Component Value Date   GLUCOSE 107 (H) 05/30/2023   NA 140 05/30/2023   K 4.2 05/30/2023   CL 105 05/30/2023   CO2 21 05/30/2023   BUN 19 05/30/2023   CREATININE 1.02 05/30/2023   GFR 70.61 05/30/2023   CALCIUM 9.2 05/30/2023   PHOS 3.6 02/22/2023   PROT 7.3 05/30/2023   ALBUMIN 4.2 05/30/2023   BILITOT 0.3 05/30/2023   ALKPHOS 86 05/30/2023   AST 15 05/30/2023   ALT 14 05/30/2023   ANIONGAP 7 03/10/2023   Last lipids Lab Results  Component Value Date   CHOL 131 12/08/2022   HDL 52.90 12/08/2022   LDLCALC 65 12/08/2022   TRIG 69.0 12/08/2022   CHOLHDL 2 12/08/2022   Last hemoglobin A1c Lab Results   Component Value Date   HGBA1C 5.6 06/14/2023   Last thyroid functions Lab Results  Component Value Date   TSH 0.65 02/16/2023   Last vitamin B12 and Folate Lab Results  Component Value Date   VITAMINB12 678 02/23/2023   FOLATE 13.2 02/23/2023      The 10-year ASCVD risk score (Arnett DK, et al., 2019) is: 45.4%    Assessment & Plan:   #1 generalized weakness.  History of weight loss and protein calorie malnutrition.  Probably multifactorial.  He is very sedentary. -Set up home physical therapy for general strengthening and endurance -Continue protein and calorie supplementation   #2 type 2 diabetes well-controlled with A1c 5.6%.  We stressed  importance of not being overly restrictive with diet given his good blood sugar control and try to work with his preferences for foods at this point to try to get more calories and nutrition in  #3 frequent loose to watery stools especially postprandial with poor appetite.  Stool pancreatic elastase pending.  Consider trial off Aricept to see if this helps his appetite although he has been on this for several years apparently.  #4 history of iron deficiency anemia with angiodysplasia of the duodenum which was cauterized.  Recent hemoglobin improving.  Continue multivitamin with iron  #5 abdominal aortic aneurysm 6 mm infrarenal.  Patient poor surgical candidate  #6 history of DVT and pulmonary embolus with IVC filter in place.  #7 Parkinson's disease with cognitive impairment.  Continue follow-up with neurology.  Return in about 4 months (around 10/14/2023).    Evelena Peat, MD

## 2023-06-14 NOTE — Patient Instructions (Signed)
Would consider holding the Aricept to see if this helps the appetite and diarrhea  I will be setting up the home health referral.

## 2023-06-21 DIAGNOSIS — G20A1 Parkinson's disease without dyskinesia, without mention of fluctuations: Secondary | ICD-10-CM | POA: Diagnosis not present

## 2023-06-21 DIAGNOSIS — I714 Abdominal aortic aneurysm, without rupture, unspecified: Secondary | ICD-10-CM | POA: Diagnosis not present

## 2023-06-21 DIAGNOSIS — E43 Unspecified severe protein-calorie malnutrition: Secondary | ICD-10-CM | POA: Diagnosis not present

## 2023-06-21 DIAGNOSIS — I2510 Atherosclerotic heart disease of native coronary artery without angina pectoris: Secondary | ICD-10-CM | POA: Diagnosis not present

## 2023-06-21 DIAGNOSIS — I10 Essential (primary) hypertension: Secondary | ICD-10-CM | POA: Diagnosis not present

## 2023-06-21 DIAGNOSIS — I2699 Other pulmonary embolism without acute cor pulmonale: Secondary | ICD-10-CM | POA: Diagnosis not present

## 2023-06-21 DIAGNOSIS — D509 Iron deficiency anemia, unspecified: Secondary | ICD-10-CM | POA: Diagnosis not present

## 2023-06-21 DIAGNOSIS — E1151 Type 2 diabetes mellitus with diabetic peripheral angiopathy without gangrene: Secondary | ICD-10-CM | POA: Diagnosis not present

## 2023-06-21 DIAGNOSIS — Z9181 History of falling: Secondary | ICD-10-CM | POA: Diagnosis not present

## 2023-06-21 DIAGNOSIS — Z7984 Long term (current) use of oral hypoglycemic drugs: Secondary | ICD-10-CM | POA: Diagnosis not present

## 2023-06-21 DIAGNOSIS — F0280 Dementia in other diseases classified elsewhere without behavioral disturbance: Secondary | ICD-10-CM | POA: Diagnosis not present

## 2023-06-21 DIAGNOSIS — G3184 Mild cognitive impairment, so stated: Secondary | ICD-10-CM | POA: Diagnosis not present

## 2023-06-21 NOTE — Telephone Encounter (Signed)
Pt checking on refill, metFORMIN (GLUCOPHAGE) 1000 MG tablet says pharmacy said provider denied and they would like to know why

## 2023-06-22 NOTE — Addendum Note (Signed)
Addended by: Christy Sartorius on: 06/22/2023 07:02 AM   Modules accepted: Orders

## 2023-06-24 DIAGNOSIS — E1151 Type 2 diabetes mellitus with diabetic peripheral angiopathy without gangrene: Secondary | ICD-10-CM | POA: Diagnosis not present

## 2023-06-24 DIAGNOSIS — I2510 Atherosclerotic heart disease of native coronary artery without angina pectoris: Secondary | ICD-10-CM | POA: Diagnosis not present

## 2023-06-24 DIAGNOSIS — D509 Iron deficiency anemia, unspecified: Secondary | ICD-10-CM | POA: Diagnosis not present

## 2023-06-24 DIAGNOSIS — G3184 Mild cognitive impairment, so stated: Secondary | ICD-10-CM | POA: Diagnosis not present

## 2023-06-24 DIAGNOSIS — E43 Unspecified severe protein-calorie malnutrition: Secondary | ICD-10-CM | POA: Diagnosis not present

## 2023-06-24 DIAGNOSIS — Z7984 Long term (current) use of oral hypoglycemic drugs: Secondary | ICD-10-CM | POA: Diagnosis not present

## 2023-06-24 DIAGNOSIS — Z9181 History of falling: Secondary | ICD-10-CM | POA: Diagnosis not present

## 2023-06-24 DIAGNOSIS — G20A1 Parkinson's disease without dyskinesia, without mention of fluctuations: Secondary | ICD-10-CM | POA: Diagnosis not present

## 2023-06-24 DIAGNOSIS — I10 Essential (primary) hypertension: Secondary | ICD-10-CM | POA: Diagnosis not present

## 2023-06-24 DIAGNOSIS — F0280 Dementia in other diseases classified elsewhere without behavioral disturbance: Secondary | ICD-10-CM | POA: Diagnosis not present

## 2023-06-24 DIAGNOSIS — I2699 Other pulmonary embolism without acute cor pulmonale: Secondary | ICD-10-CM | POA: Diagnosis not present

## 2023-06-24 DIAGNOSIS — I714 Abdominal aortic aneurysm, without rupture, unspecified: Secondary | ICD-10-CM | POA: Diagnosis not present

## 2023-06-24 MED ORDER — METFORMIN HCL 1000 MG PO TABS
ORAL_TABLET | ORAL | 0 refills | Status: DC
Start: 2023-06-24 — End: 2023-08-26

## 2023-06-24 NOTE — Telephone Encounter (Signed)
I spoke with the patient's wife and she confirmed the patient is still taking and would like to have medication refilled. Rx sent per request

## 2023-06-24 NOTE — Addendum Note (Signed)
Addended by: Christy Sartorius on: 06/24/2023 07:04 AM   Modules accepted: Orders

## 2023-06-28 DIAGNOSIS — D509 Iron deficiency anemia, unspecified: Secondary | ICD-10-CM | POA: Diagnosis not present

## 2023-06-28 DIAGNOSIS — G3184 Mild cognitive impairment, so stated: Secondary | ICD-10-CM | POA: Diagnosis not present

## 2023-06-28 DIAGNOSIS — I2510 Atherosclerotic heart disease of native coronary artery without angina pectoris: Secondary | ICD-10-CM | POA: Diagnosis not present

## 2023-06-28 DIAGNOSIS — Z7984 Long term (current) use of oral hypoglycemic drugs: Secondary | ICD-10-CM | POA: Diagnosis not present

## 2023-06-28 DIAGNOSIS — I2699 Other pulmonary embolism without acute cor pulmonale: Secondary | ICD-10-CM | POA: Diagnosis not present

## 2023-06-28 DIAGNOSIS — E43 Unspecified severe protein-calorie malnutrition: Secondary | ICD-10-CM | POA: Diagnosis not present

## 2023-06-28 DIAGNOSIS — I10 Essential (primary) hypertension: Secondary | ICD-10-CM | POA: Diagnosis not present

## 2023-06-28 DIAGNOSIS — Z9181 History of falling: Secondary | ICD-10-CM | POA: Diagnosis not present

## 2023-06-28 DIAGNOSIS — F0280 Dementia in other diseases classified elsewhere without behavioral disturbance: Secondary | ICD-10-CM | POA: Diagnosis not present

## 2023-06-28 DIAGNOSIS — E1151 Type 2 diabetes mellitus with diabetic peripheral angiopathy without gangrene: Secondary | ICD-10-CM | POA: Diagnosis not present

## 2023-06-28 DIAGNOSIS — I714 Abdominal aortic aneurysm, without rupture, unspecified: Secondary | ICD-10-CM | POA: Diagnosis not present

## 2023-06-28 DIAGNOSIS — G20A1 Parkinson's disease without dyskinesia, without mention of fluctuations: Secondary | ICD-10-CM | POA: Diagnosis not present

## 2023-06-29 DIAGNOSIS — F0280 Dementia in other diseases classified elsewhere without behavioral disturbance: Secondary | ICD-10-CM | POA: Diagnosis not present

## 2023-06-29 DIAGNOSIS — I714 Abdominal aortic aneurysm, without rupture, unspecified: Secondary | ICD-10-CM | POA: Diagnosis not present

## 2023-06-29 DIAGNOSIS — G20A1 Parkinson's disease without dyskinesia, without mention of fluctuations: Secondary | ICD-10-CM | POA: Diagnosis not present

## 2023-06-29 DIAGNOSIS — I2699 Other pulmonary embolism without acute cor pulmonale: Secondary | ICD-10-CM | POA: Diagnosis not present

## 2023-06-29 DIAGNOSIS — I2510 Atherosclerotic heart disease of native coronary artery without angina pectoris: Secondary | ICD-10-CM | POA: Diagnosis not present

## 2023-06-29 DIAGNOSIS — G3184 Mild cognitive impairment, so stated: Secondary | ICD-10-CM | POA: Diagnosis not present

## 2023-06-29 DIAGNOSIS — Z7984 Long term (current) use of oral hypoglycemic drugs: Secondary | ICD-10-CM | POA: Diagnosis not present

## 2023-06-29 DIAGNOSIS — Z9181 History of falling: Secondary | ICD-10-CM | POA: Diagnosis not present

## 2023-06-29 DIAGNOSIS — E1151 Type 2 diabetes mellitus with diabetic peripheral angiopathy without gangrene: Secondary | ICD-10-CM | POA: Diagnosis not present

## 2023-06-29 DIAGNOSIS — D509 Iron deficiency anemia, unspecified: Secondary | ICD-10-CM | POA: Diagnosis not present

## 2023-06-29 DIAGNOSIS — E43 Unspecified severe protein-calorie malnutrition: Secondary | ICD-10-CM | POA: Diagnosis not present

## 2023-06-29 DIAGNOSIS — I10 Essential (primary) hypertension: Secondary | ICD-10-CM | POA: Diagnosis not present

## 2023-07-05 DIAGNOSIS — I714 Abdominal aortic aneurysm, without rupture, unspecified: Secondary | ICD-10-CM | POA: Diagnosis not present

## 2023-07-05 DIAGNOSIS — E1151 Type 2 diabetes mellitus with diabetic peripheral angiopathy without gangrene: Secondary | ICD-10-CM | POA: Diagnosis not present

## 2023-07-05 DIAGNOSIS — I2699 Other pulmonary embolism without acute cor pulmonale: Secondary | ICD-10-CM | POA: Diagnosis not present

## 2023-07-05 DIAGNOSIS — Z7984 Long term (current) use of oral hypoglycemic drugs: Secondary | ICD-10-CM | POA: Diagnosis not present

## 2023-07-05 DIAGNOSIS — Z9181 History of falling: Secondary | ICD-10-CM | POA: Diagnosis not present

## 2023-07-05 DIAGNOSIS — E43 Unspecified severe protein-calorie malnutrition: Secondary | ICD-10-CM | POA: Diagnosis not present

## 2023-07-05 DIAGNOSIS — F0280 Dementia in other diseases classified elsewhere without behavioral disturbance: Secondary | ICD-10-CM | POA: Diagnosis not present

## 2023-07-05 DIAGNOSIS — I10 Essential (primary) hypertension: Secondary | ICD-10-CM | POA: Diagnosis not present

## 2023-07-05 DIAGNOSIS — G3184 Mild cognitive impairment, so stated: Secondary | ICD-10-CM | POA: Diagnosis not present

## 2023-07-05 DIAGNOSIS — D509 Iron deficiency anemia, unspecified: Secondary | ICD-10-CM | POA: Diagnosis not present

## 2023-07-05 DIAGNOSIS — G20A1 Parkinson's disease without dyskinesia, without mention of fluctuations: Secondary | ICD-10-CM | POA: Diagnosis not present

## 2023-07-05 DIAGNOSIS — I2510 Atherosclerotic heart disease of native coronary artery without angina pectoris: Secondary | ICD-10-CM | POA: Diagnosis not present

## 2023-07-06 DIAGNOSIS — E1151 Type 2 diabetes mellitus with diabetic peripheral angiopathy without gangrene: Secondary | ICD-10-CM | POA: Diagnosis not present

## 2023-07-06 DIAGNOSIS — I10 Essential (primary) hypertension: Secondary | ICD-10-CM | POA: Diagnosis not present

## 2023-07-06 DIAGNOSIS — Z9181 History of falling: Secondary | ICD-10-CM | POA: Diagnosis not present

## 2023-07-06 DIAGNOSIS — G20A1 Parkinson's disease without dyskinesia, without mention of fluctuations: Secondary | ICD-10-CM | POA: Diagnosis not present

## 2023-07-06 DIAGNOSIS — Z7984 Long term (current) use of oral hypoglycemic drugs: Secondary | ICD-10-CM | POA: Diagnosis not present

## 2023-07-06 DIAGNOSIS — I714 Abdominal aortic aneurysm, without rupture, unspecified: Secondary | ICD-10-CM | POA: Diagnosis not present

## 2023-07-06 DIAGNOSIS — G3184 Mild cognitive impairment, so stated: Secondary | ICD-10-CM | POA: Diagnosis not present

## 2023-07-06 DIAGNOSIS — F0280 Dementia in other diseases classified elsewhere without behavioral disturbance: Secondary | ICD-10-CM | POA: Diagnosis not present

## 2023-07-06 DIAGNOSIS — I2510 Atherosclerotic heart disease of native coronary artery without angina pectoris: Secondary | ICD-10-CM | POA: Diagnosis not present

## 2023-07-06 DIAGNOSIS — I2699 Other pulmonary embolism without acute cor pulmonale: Secondary | ICD-10-CM | POA: Diagnosis not present

## 2023-07-06 DIAGNOSIS — E43 Unspecified severe protein-calorie malnutrition: Secondary | ICD-10-CM | POA: Diagnosis not present

## 2023-07-06 DIAGNOSIS — D509 Iron deficiency anemia, unspecified: Secondary | ICD-10-CM | POA: Diagnosis not present

## 2023-07-12 DIAGNOSIS — E43 Unspecified severe protein-calorie malnutrition: Secondary | ICD-10-CM | POA: Diagnosis not present

## 2023-07-12 DIAGNOSIS — Z9181 History of falling: Secondary | ICD-10-CM | POA: Diagnosis not present

## 2023-07-12 DIAGNOSIS — I10 Essential (primary) hypertension: Secondary | ICD-10-CM | POA: Diagnosis not present

## 2023-07-12 DIAGNOSIS — I2510 Atherosclerotic heart disease of native coronary artery without angina pectoris: Secondary | ICD-10-CM | POA: Diagnosis not present

## 2023-07-12 DIAGNOSIS — G20A1 Parkinson's disease without dyskinesia, without mention of fluctuations: Secondary | ICD-10-CM | POA: Diagnosis not present

## 2023-07-12 DIAGNOSIS — I714 Abdominal aortic aneurysm, without rupture, unspecified: Secondary | ICD-10-CM | POA: Diagnosis not present

## 2023-07-12 DIAGNOSIS — F0280 Dementia in other diseases classified elsewhere without behavioral disturbance: Secondary | ICD-10-CM | POA: Diagnosis not present

## 2023-07-12 DIAGNOSIS — Z7984 Long term (current) use of oral hypoglycemic drugs: Secondary | ICD-10-CM | POA: Diagnosis not present

## 2023-07-12 DIAGNOSIS — D509 Iron deficiency anemia, unspecified: Secondary | ICD-10-CM | POA: Diagnosis not present

## 2023-07-12 DIAGNOSIS — E1151 Type 2 diabetes mellitus with diabetic peripheral angiopathy without gangrene: Secondary | ICD-10-CM | POA: Diagnosis not present

## 2023-07-12 DIAGNOSIS — I2699 Other pulmonary embolism without acute cor pulmonale: Secondary | ICD-10-CM | POA: Diagnosis not present

## 2023-07-12 DIAGNOSIS — G3184 Mild cognitive impairment, so stated: Secondary | ICD-10-CM | POA: Diagnosis not present

## 2023-07-21 ENCOUNTER — Other Ambulatory Visit: Payer: Self-pay

## 2023-07-21 DIAGNOSIS — I714 Abdominal aortic aneurysm, without rupture, unspecified: Secondary | ICD-10-CM

## 2023-07-23 ENCOUNTER — Other Ambulatory Visit: Payer: Self-pay | Admitting: Family Medicine

## 2023-08-09 DIAGNOSIS — L218 Other seborrheic dermatitis: Secondary | ICD-10-CM | POA: Diagnosis not present

## 2023-08-09 DIAGNOSIS — D485 Neoplasm of uncertain behavior of skin: Secondary | ICD-10-CM | POA: Diagnosis not present

## 2023-08-09 DIAGNOSIS — C44329 Squamous cell carcinoma of skin of other parts of face: Secondary | ICD-10-CM | POA: Diagnosis not present

## 2023-08-09 DIAGNOSIS — Z85828 Personal history of other malignant neoplasm of skin: Secondary | ICD-10-CM | POA: Diagnosis not present

## 2023-08-18 ENCOUNTER — Ambulatory Visit (HOSPITAL_COMMUNITY)
Admission: RE | Admit: 2023-08-18 | Discharge: 2023-08-18 | Disposition: A | Discharge status: Home / Self Care | Payer: Medicare HMO | Source: Ambulatory Visit | Attending: Vascular Surgery | Admitting: Vascular Surgery

## 2023-08-18 DIAGNOSIS — I714 Abdominal aortic aneurysm, without rupture, unspecified: Secondary | ICD-10-CM | POA: Insufficient documentation

## 2023-08-18 DIAGNOSIS — K449 Diaphragmatic hernia without obstruction or gangrene: Secondary | ICD-10-CM | POA: Diagnosis not present

## 2023-08-18 DIAGNOSIS — I701 Atherosclerosis of renal artery: Secondary | ICD-10-CM | POA: Diagnosis not present

## 2023-08-18 DIAGNOSIS — I723 Aneurysm of iliac artery: Secondary | ICD-10-CM | POA: Diagnosis not present

## 2023-08-18 LAB — POCT I-STAT CREATININE: Creatinine, Ser: 1.3 mg/dL — ABNORMAL HIGH (ref 0.61–1.24)

## 2023-08-18 MED ORDER — IOHEXOL 350 MG/ML SOLN
100.0000 mL | Freq: Once | INTRAVENOUS | Status: AC | PRN
  Administered 2023-08-18: 100 mL via INTRAVENOUS

## 2023-08-24 ENCOUNTER — Encounter: Payer: Self-pay | Admitting: Vascular Surgery

## 2023-08-24 ENCOUNTER — Ambulatory Visit (INDEPENDENT_AMBULATORY_CARE_PROVIDER_SITE_OTHER): Payer: Medicare HMO | Admitting: Vascular Surgery

## 2023-08-24 VITALS — BP 155/71 | HR 82 | Temp 98.2°F | Ht 67.0 in | Wt 140.1 lb

## 2023-08-24 DIAGNOSIS — I714 Abdominal aortic aneurysm, without rupture, unspecified: Secondary | ICD-10-CM

## 2023-08-24 NOTE — Progress Notes (Signed)
Patient ID: Walter [name], male   DOB: 11/13/1945, 77 y.o.   MRN: [id number]  Reason for Consult: Follow-up   Referred by Kristian Covey, MD  Subjective:     HPI:  Walter [name] is a 77 y.o. male has history of abdominal aortic aneurysm and was recently hospitalized after a fall with compression fracture found to have pulmonary embolus could not tolerate anticoagulation and ultimately IVC filter was placed by Dr. Karin Lieu.  He has not been anticoagulated since discharge and in fact does not take any anticoagulants or antiplatelet medications at this time.  At last visit patient was down to 112 pounds and was living in a SNF.  At this time he has returned home to his usual level of activity and has been eating and has gained weight up to 140 pounds and really has no complaints related to today's visit.  He denies any previous abdominal surgeries.  Patient does have Parkinson's which limits his activities but he walks without any limitation or crutch.  Past Medical History:  Diagnosis Date   Allergy    Cancer (HCC)    Skin cancer   Cataract    Coronary artery disease    Diabetes mellitus    Diverticulosis    Esophageal stricture    GERD (gastroesophageal reflux disease)    Hyperlipidemia    Hypertension    Peripheral vascular disease (HCC)    Family History  Problem Relation Age of Onset   Breast cancer Mother    Healthy Son    Healthy Son    Parkinson's disease Sister    Colon cancer Neg Hx    Esophageal cancer Neg Hx    Rectal cancer Neg Hx    Stomach cancer Neg Hx    Past Surgical History:  Procedure Laterality Date   BIOPSY  03/07/2023   Procedure: BIOPSY;  Surgeon: Beverley Fiedler, MD;  Location: [facility] ENDOSCOPY;  Service: Gastroenterology;;   COLONOSCOPY WITH PROPOFOL N/A 03/07/2023   Procedure: COLONOSCOPY WITH PROPOFOL;  Surgeon: Beverley Fiedler, MD;  Location: [facility] ENDOSCOPY;  Service: Gastroenterology;  Laterality: N/A;   CORONARY ANGIOPLASTY WITH STENT PLACEMENT      ESOPHAGOGASTRODUODENOSCOPY  06/07/2012   Procedure: ESOPHAGOGASTRODUODENOSCOPY (EGD);  Surgeon: Hart Carwin, MD;  Location: [facility] ENDOSCOPY;  Service: Endoscopy;  Laterality: N/A;   ESOPHAGOGASTRODUODENOSCOPY (EGD) WITH PROPOFOL N/A 03/07/2023   Procedure: ESOPHAGOGASTRODUODENOSCOPY (EGD) WITH PROPOFOL;  Surgeon: Beverley Fiedler, MD;  Location: [facility] ENDOSCOPY;  Service: Gastroenterology;  Laterality: N/A;   HOT HEMOSTASIS N/A 03/07/2023   Procedure: HOT HEMOSTASIS (ARGON PLASMA COAGULATION/BICAP);  Surgeon: Beverley Fiedler, MD;  Location: [facility] ENDOSCOPY;  Service: Gastroenterology;  Laterality: N/A;   IR KYPHO LUMBAR INC FX REDUCE BONE BX UNI/BIL CANNULATION INC/IMAGING  03/02/2023   IVC FILTER INSERTION N/A 03/09/2023   Procedure: IVC FILTER INSERTION;  Surgeon: Victorino Sparrow, MD;  Location: [facility] INVASIVE CV LAB;  Service: Cardiovascular;  Laterality: N/A;   POLYPECTOMY  03/07/2023   Procedure: POLYPECTOMY;  Surgeon: Beverley Fiedler, MD;  Location: [facility] ENDOSCOPY;  Service: Gastroenterology;;   SHOULDER SURGERY  2006    Short Social History:  Social History   Tobacco Use   Smoking status: Former    Current packs/day: 0.00    Types: Cigarettes    Quit date: 11/02/1999    Years since quitting: 23.8   Smokeless tobacco: Never  Substance Use Topics   Alcohol use: No    No Known Allergies  Current Outpatient Medications  Medication Sig Dispense Refill   amLODipine (NORVASC) 5 MG tablet Take 1 tablet by mouth once daily 90 tablet 1   atorvastatin (LIPITOR) 40 MG tablet Take 1 tablet by mouth once daily 90 tablet 1   Blood Glucose Monitoring Suppl (ONE TOUCH ULTRA 2) w/Device KIT 1 kit by Does not apply route daily. 1 kit 0   cyanocobalamin 2000 MCG tablet Take 1 tablet (2,000 mcg total) by mouth daily. 30 tablet 0   Dihydroxyaluminum Sod Carb (ROLAIDS PO) Take 1 tablet by mouth as needed (heartburn).     docusate sodium (COLACE) 100 MG capsule Take 1 capsule (100 mg total) by mouth 2 (two) times daily. 10  capsule 0   feeding supplement (ENSURE ENLIVE / ENSURE PLUS) LIQD Take 237 mLs by mouth 2 (two) times daily between meals. 237 mL 12   Ferrous Sulfate (IRON) 325 (65 Fe) MG TABS Take 1 tablet by mouth once daily 30 tablet 0   Lancets (ONETOUCH ULTRASOFT) lancets Check blood sugars once daily. Dx: E11.51 100 each 12   losartan (COZAAR) 100 MG tablet Take 1 tablet by mouth once daily 90 tablet 1   memantine (NAMENDA) 10 MG tablet Take 1 tablet twice a day (Patient taking differently: Take 10 mg by mouth 2 (two) times daily.) 180 tablet 3   metFORMIN (GLUCOPHAGE) 1000 MG tablet TAKE 1 TABLET BY MOUTH TWICE DAILY WITH A MEAL 180 tablet 0   Multiple Vitamin (MULTIVITAMIN WITH MINERALS) TABS tablet Take 1 tablet by mouth daily.     ONETOUCH ULTRA test strip USE 1 STRIP TO CHECK GLUCOSE ONCE DAILY 100 each 2   OVER THE COUNTER MEDICATION Take 1 tablet by mouth daily. Allergy relief     pantoprazole (PROTONIX) 40 MG tablet Take 1 tablet (40 mg total) by mouth daily.     donepezil (ARICEPT) 10 MG tablet Take 1 tablet daily (Patient not taking: Reported on 08/24/2023) 90 tablet 3   lidocaine 4 % Place 1 patch onto the skin daily as needed (pain). (Patient not taking: Reported on 08/24/2023)     No current facility-administered medications for this visit.    Review of Systems  Constitutional:  Constitutional negative. HENT: HENT negative.  Eyes: Eyes negative.  Respiratory: Respiratory negative.  Cardiovascular: Cardiovascular negative.  GI: Gastrointestinal negative.  Musculoskeletal: Musculoskeletal negative.  Skin: Skin negative.  Neurological: Neurological negative. Hematologic: Hematologic/lymphatic negative.  Psychiatric: Psychiatric negative.        Objective:  Objective   Vitals:   08/24/23 1208  BP: (!) 155/71  Pulse: 82  Temp: 98.2 F (36.8 C)  TempSrc: Temporal  SpO2: 97%  Weight: 140 lb 1.6 oz (63.5 kg)  Height: 5\' 7"  (1.702 m)   Body mass index is 21.94  kg/m.  Physical Exam HENT:     Head: Normocephalic.     Nose: Nose normal.  Eyes:     Pupils: Pupils are equal, round, and reactive to light.  Cardiovascular:     Pulses:          Femoral pulses are 0 on the right side and 2+ on the left side.      Posterior tibial pulses are 2+ on the left side.  Pulmonary:     Effort: Pulmonary effort is normal.  Abdominal:     General: Abdomen is flat.  Musculoskeletal:     Cervical back: Normal range of motion.     Right lower leg: No edema.     Left lower leg: No edema.  Skin:    Capillary Refill: Capillary refill on the right is delayed in his right foot is cool relative to the left Neurological:     General: No focal deficit present.     Mental Status: He is alert.  Psychiatric:        Mood and Affect: Mood normal.        Thought Content: Thought content normal.        Judgment: Judgment normal.     Data: CTA reviewed with patient and wife today with slightly enlarged aneurysm measuring over 6 cm in the infrarenal position with an occluded common iliac artery     Assessment/Plan:    77 year old male initially found to have abdominal aortic aneurysm after hospitalization from a fall with compression fracture.  Patient has really had a tremendous recovery gained approximately 30 pounds since his last visit and is returned home from SNF.  Given the size of his aneurysm measured over 6 cm today I have quoted him approximately 10% risk of rupture in the next 12 months.  Patient currently at his baseline level of activity not requiring any assistance with walking and I think it is certainly reasonable to proceed with repair.  This will need to be aorto Uni iliac repair with left to right femorofemoral bypass.  I have discussed the risk benefits as well as need for preoperative cardiac clearance and postoperative stay in the hospital 2 to 3 days and patient and his wife demonstrate good understanding.  He will be scheduled pending cardiac  evaluation.     Maeola Harman MD [facility]

## 2023-08-24 NOTE — H&P (View-Only) (Signed)
 Patient ID: Walter [name], male   DOB: 11/13/1945, 77 y.o.   MRN: [id number]  Reason for Consult: Follow-up   Referred by Kristian Covey, MD  Subjective:     HPI:  Walter [name] is a 77 y.o. male has history of abdominal aortic aneurysm and was recently hospitalized after a fall with compression fracture found to have pulmonary embolus could not tolerate anticoagulation and ultimately IVC filter was placed by Dr. Karin Lieu.  He has not been anticoagulated since discharge and in fact does not take any anticoagulants or antiplatelet medications at this time.  At last visit patient was down to 112 pounds and was living in a SNF.  At this time he has returned home to his usual level of activity and has been eating and has gained weight up to 140 pounds and really has no complaints related to today's visit.  He denies any previous abdominal surgeries.  Patient does have Parkinson's which limits his activities but he walks without any limitation or crutch.  Past Medical History:  Diagnosis Date   Allergy    Cancer (HCC)    Skin cancer   Cataract    Coronary artery disease    Diabetes mellitus    Diverticulosis    Esophageal stricture    GERD (gastroesophageal reflux disease)    Hyperlipidemia    Hypertension    Peripheral vascular disease (HCC)    Family History  Problem Relation Age of Onset   Breast cancer Mother    Healthy Son    Healthy Son    Parkinson's disease Sister    Colon cancer Neg Hx    Esophageal cancer Neg Hx    Rectal cancer Neg Hx    Stomach cancer Neg Hx    Past Surgical History:  Procedure Laterality Date   BIOPSY  03/07/2023   Procedure: BIOPSY;  Surgeon: Beverley Fiedler, MD;  Location: [facility] ENDOSCOPY;  Service: Gastroenterology;;   COLONOSCOPY WITH PROPOFOL N/A 03/07/2023   Procedure: COLONOSCOPY WITH PROPOFOL;  Surgeon: Beverley Fiedler, MD;  Location: [facility] ENDOSCOPY;  Service: Gastroenterology;  Laterality: N/A;   CORONARY ANGIOPLASTY WITH STENT PLACEMENT      ESOPHAGOGASTRODUODENOSCOPY  06/07/2012   Procedure: ESOPHAGOGASTRODUODENOSCOPY (EGD);  Surgeon: Hart Carwin, MD;  Location: [facility] ENDOSCOPY;  Service: Endoscopy;  Laterality: N/A;   ESOPHAGOGASTRODUODENOSCOPY (EGD) WITH PROPOFOL N/A 03/07/2023   Procedure: ESOPHAGOGASTRODUODENOSCOPY (EGD) WITH PROPOFOL;  Surgeon: Beverley Fiedler, MD;  Location: [facility] ENDOSCOPY;  Service: Gastroenterology;  Laterality: N/A;   HOT HEMOSTASIS N/A 03/07/2023   Procedure: HOT HEMOSTASIS (ARGON PLASMA COAGULATION/BICAP);  Surgeon: Beverley Fiedler, MD;  Location: [facility] ENDOSCOPY;  Service: Gastroenterology;  Laterality: N/A;   IR KYPHO LUMBAR INC FX REDUCE BONE BX UNI/BIL CANNULATION INC/IMAGING  03/02/2023   IVC FILTER INSERTION N/A 03/09/2023   Procedure: IVC FILTER INSERTION;  Surgeon: Victorino Sparrow, MD;  Location: [facility] INVASIVE CV LAB;  Service: Cardiovascular;  Laterality: N/A;   POLYPECTOMY  03/07/2023   Procedure: POLYPECTOMY;  Surgeon: Beverley Fiedler, MD;  Location: [facility] ENDOSCOPY;  Service: Gastroenterology;;   SHOULDER SURGERY  2006    Short Social History:  Social History   Tobacco Use   Smoking status: Former    Current packs/day: 0.00    Types: Cigarettes    Quit date: 11/02/1999    Years since quitting: 23.8   Smokeless tobacco: Never  Substance Use Topics   Alcohol use: No    No Known Allergies  Current Outpatient Medications  Medication Sig Dispense Refill   amLODipine (NORVASC) 5 MG tablet Take 1 tablet by mouth once daily 90 tablet 1   atorvastatin (LIPITOR) 40 MG tablet Take 1 tablet by mouth once daily 90 tablet 1   Blood Glucose Monitoring Suppl (ONE TOUCH ULTRA 2) w/Device KIT 1 kit by Does not apply route daily. 1 kit 0   cyanocobalamin 2000 MCG tablet Take 1 tablet (2,000 mcg total) by mouth daily. 30 tablet 0   Dihydroxyaluminum Sod Carb (ROLAIDS PO) Take 1 tablet by mouth as needed (heartburn).     docusate sodium (COLACE) 100 MG capsule Take 1 capsule (100 mg total) by mouth 2 (two) times daily. 10  capsule 0   feeding supplement (ENSURE ENLIVE / ENSURE PLUS) LIQD Take 237 mLs by mouth 2 (two) times daily between meals. 237 mL 12   Ferrous Sulfate (IRON) 325 (65 Fe) MG TABS Take 1 tablet by mouth once daily 30 tablet 0   Lancets (ONETOUCH ULTRASOFT) lancets Check blood sugars once daily. Dx: E11.51 100 each 12   losartan (COZAAR) 100 MG tablet Take 1 tablet by mouth once daily 90 tablet 1   memantine (NAMENDA) 10 MG tablet Take 1 tablet twice a day (Patient taking differently: Take 10 mg by mouth 2 (two) times daily.) 180 tablet 3   metFORMIN (GLUCOPHAGE) 1000 MG tablet TAKE 1 TABLET BY MOUTH TWICE DAILY WITH A MEAL 180 tablet 0   Multiple Vitamin (MULTIVITAMIN WITH MINERALS) TABS tablet Take 1 tablet by mouth daily.     ONETOUCH ULTRA test strip USE 1 STRIP TO CHECK GLUCOSE ONCE DAILY 100 each 2   OVER THE COUNTER MEDICATION Take 1 tablet by mouth daily. Allergy relief     pantoprazole (PROTONIX) 40 MG tablet Take 1 tablet (40 mg total) by mouth daily.     donepezil (ARICEPT) 10 MG tablet Take 1 tablet daily (Patient not taking: Reported on 08/24/2023) 90 tablet 3   lidocaine 4 % Place 1 patch onto the skin daily as needed (pain). (Patient not taking: Reported on 08/24/2023)     No current facility-administered medications for this visit.    Review of Systems  Constitutional:  Constitutional negative. HENT: HENT negative.  Eyes: Eyes negative.  Respiratory: Respiratory negative.  Cardiovascular: Cardiovascular negative.  GI: Gastrointestinal negative.  Musculoskeletal: Musculoskeletal negative.  Skin: Skin negative.  Neurological: Neurological negative. Hematologic: Hematologic/lymphatic negative.  Psychiatric: Psychiatric negative.        Objective:  Objective   Vitals:   08/24/23 1208  BP: (!) 155/71  Pulse: 82  Temp: 98.2 F (36.8 C)  TempSrc: Temporal  SpO2: 97%  Weight: 140 lb 1.6 oz (63.5 kg)  Height: 5\' 7"  (1.702 m)   Body mass index is 21.94  kg/m.  Physical Exam HENT:     Head: Normocephalic.     Nose: Nose normal.  Eyes:     Pupils: Pupils are equal, round, and reactive to light.  Cardiovascular:     Pulses:          Femoral pulses are 0 on the right side and 2+ on the left side.      Posterior tibial pulses are 2+ on the left side.  Pulmonary:     Effort: Pulmonary effort is normal.  Abdominal:     General: Abdomen is flat.  Musculoskeletal:     Cervical back: Normal range of motion.     Right lower leg: No edema.     Left lower leg: No edema.  Skin:    Capillary Refill: Capillary refill on the right is delayed in his right foot is cool relative to the left Neurological:     General: No focal deficit present.     Mental Status: He is alert.  Psychiatric:        Mood and Affect: Mood normal.        Thought Content: Thought content normal.        Judgment: Judgment normal.     Data: CTA reviewed with patient and wife today with slightly enlarged aneurysm measuring over 6 cm in the infrarenal position with an occluded common iliac artery     Assessment/Plan:    77 year old male initially found to have abdominal aortic aneurysm after hospitalization from a fall with compression fracture.  Patient has really had a tremendous recovery gained approximately 30 pounds since his last visit and is returned home from SNF.  Given the size of his aneurysm measured over 6 cm today I have quoted him approximately 10% risk of rupture in the next 12 months.  Patient currently at his baseline level of activity not requiring any assistance with walking and I think it is certainly reasonable to proceed with repair.  This will need to be aorto Uni iliac repair with left to right femorofemoral bypass.  I have discussed the risk benefits as well as need for preoperative cardiac clearance and postoperative stay in the hospital 2 to 3 days and patient and his wife demonstrate good understanding.  He will be scheduled pending cardiac  evaluation.     Maeola Harman MD [facility]

## 2023-08-26 ENCOUNTER — Encounter: Payer: Self-pay | Admitting: Internal Medicine

## 2023-08-26 ENCOUNTER — Ambulatory Visit: Payer: Medicare HMO | Attending: Internal Medicine | Admitting: Internal Medicine

## 2023-08-26 VITALS — BP 128/62 | HR 85 | Ht 66.0 in | Wt 139.6 lb

## 2023-08-26 DIAGNOSIS — Z86718 Personal history of other venous thrombosis and embolism: Secondary | ICD-10-CM | POA: Diagnosis not present

## 2023-08-26 DIAGNOSIS — Z95828 Presence of other vascular implants and grafts: Secondary | ICD-10-CM | POA: Diagnosis not present

## 2023-08-26 DIAGNOSIS — I10 Essential (primary) hypertension: Secondary | ICD-10-CM

## 2023-08-26 DIAGNOSIS — Z01818 Encounter for other preprocedural examination: Secondary | ICD-10-CM | POA: Diagnosis not present

## 2023-08-26 DIAGNOSIS — Z01810 Encounter for preprocedural cardiovascular examination: Secondary | ICD-10-CM | POA: Insufficient documentation

## 2023-08-26 MED ORDER — ASPIRIN 81 MG PO TBEC: 81.0000 mg | DELAYED_RELEASE_TABLET | Freq: Every day | ORAL | Status: AC

## 2023-08-26 NOTE — Patient Instructions (Signed)
Medication Instructions:  Your physician recommends that you continue on your current medications as directed. Please refer to the Current Medication list given to you today.   Labwork: None  Testing/Procedures: Your physician has requested that you have a lexiscan myoview. For further information please visit [email or web address] Please follow instruction sheet, as given.   Follow-Up: Your physician recommends that you schedule a follow-up appointment in: 6 months  Any Other Special Instructions Will Be Listed Below (If Applicable).  Thank you for choosing [facility]!      If you need a refill on your cardiac medications before your next appointment, please call your pharmacy.

## 2023-08-26 NOTE — Progress Notes (Signed)
Cardiology Office Note  Date: 08/26/2023   ID: Sunday Corn, DOB 10/27/1946, MRN [id number]  PCP:  Kristian Covey, MD  Cardiologist:  Marjo Bicker, MD Electrophysiologist:  None   History of Present Illness: Walter [name] is a 77 y.o. 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  Not much active at home, METS less than 4.  Denies having any angina but does have intermittent chest pains mostly at rest.  No DOE, orthopnea, PND, dizziness, presyncope, syncope, palpitations or leg swelling.   Past Medical History:  Diagnosis Date   Allergy    Cancer [facility])    Skin cancer   Cataract    Coronary artery disease    Diabetes mellitus    Diverticulosis    Esophageal stricture    GERD (gastroesophageal reflux disease)    Hyperlipidemia    Hypertension    Peripheral vascular disease (HCC)     Past Surgical History:  Procedure Laterality Date   BIOPSY  03/07/2023   Procedure: BIOPSY;  Surgeon: Beverley Fiedler, MD;  Location: [facility] ENDOSCOPY;  Service: Gastroenterology;;   COLONOSCOPY WITH PROPOFOL N/A 03/07/2023   Procedure: COLONOSCOPY WITH PROPOFOL;  Surgeon: Beverley Fiedler, MD;  Location: [facility] ENDOSCOPY;  Service: Gastroenterology;  Laterality: N/A;   CORONARY ANGIOPLASTY WITH STENT PLACEMENT     ESOPHAGOGASTRODUODENOSCOPY  06/07/2012   Procedure: ESOPHAGOGASTRODUODENOSCOPY (EGD);  Surgeon: Hart Carwin, MD;  Location: [facility] ENDOSCOPY;  Service: Endoscopy;  Laterality: N/A;   ESOPHAGOGASTRODUODENOSCOPY (EGD) WITH PROPOFOL N/A 03/07/2023   Procedure: ESOPHAGOGASTRODUODENOSCOPY (EGD) WITH PROPOFOL;  Surgeon: Beverley Fiedler, MD;  Location: [facility] ENDOSCOPY;  Service: Gastroenterology;  Laterality: N/A;   HOT HEMOSTASIS N/A 03/07/2023   Procedure: HOT HEMOSTASIS (ARGON PLASMA  COAGULATION/BICAP);  Surgeon: Beverley Fiedler, MD;  Location: [facility] ENDOSCOPY;  Service: Gastroenterology;  Laterality: N/A;   IR KYPHO LUMBAR INC FX REDUCE BONE BX UNI/BIL CANNULATION INC/IMAGING  03/02/2023   IVC FILTER INSERTION N/A 03/09/2023   Procedure: IVC FILTER INSERTION;  Surgeon: Victorino Sparrow, MD;  Location: [facility] INVASIVE CV LAB;  Service: Cardiovascular;  Laterality: N/A;   POLYPECTOMY  03/07/2023   Procedure: POLYPECTOMY;  Surgeon: Beverley Fiedler, MD;  Location: [facility] ENDOSCOPY;  Service: Gastroenterology;;   SHOULDER SURGERY  2006    Current Outpatient Medications  Medication Sig Dispense Refill   amLODipine (NORVASC) 5 MG tablet Take 1 tablet by mouth once daily 90 tablet 1   aspirin EC 81 MG tablet Take 1 tablet (81 mg total) by mouth daily. Swallow whole.     atorvastatin (LIPITOR) 40 MG tablet Take 1 tablet by mouth once daily 90 tablet 1   Blood Glucose Monitoring Suppl (ONE TOUCH ULTRA 2) w/Device KIT 1 kit by Does not apply route daily. 1 kit 0   cyanocobalamin 2000 MCG tablet Take 1 tablet (2,000 mcg total) by mouth daily. 30 tablet 0   Dihydroxyaluminum Sod Carb (ROLAIDS PO) Take 1 tablet by mouth as needed (heartburn).     Ferrous Sulfate (IRON) 325 (65 Fe) MG TABS Take 1 tablet by mouth once daily 30 tablet 0   Lancets (ONETOUCH ULTRASOFT) lancets Check blood sugars once daily. Dx: E11.51 100 each 12   losartan (COZAAR) 100 MG tablet Take 1 tablet by mouth once daily 90 tablet 1   memantine (  NAMENDA) 10 MG tablet Take 1 tablet twice a day (Patient taking differently: Take 10 mg by mouth 2 (two) times daily.) 180 tablet 3   Multiple Vitamin (MULTIVITAMIN WITH MINERALS) TABS tablet Take 1 tablet by mouth daily.     ONETOUCH ULTRA test strip USE 1 STRIP TO CHECK GLUCOSE ONCE DAILY 100 each 2   OVER THE COUNTER MEDICATION Take 1 tablet by mouth daily. Allergy relief     pantoprazole (PROTONIX) 40 MG tablet Take 1 tablet (40 mg total) by mouth daily.     No current  facility-administered medications for this visit.   Allergies:  Patient has no known allergies.   Social History: The patient  reports that he quit smoking about 23 years ago. His smoking use included cigarettes. He has never used smokeless tobacco. He reports that he does not drink alcohol and does not use drugs.   Family History: The patient's family history includes Breast cancer in his mother; Healthy in his son and son; Parkinson's disease in his sister.   ROS:  Please see the history of present illness. Otherwise, complete review of systems is positive for none  All other systems are reviewed and negative.   Physical Exam: VS:  BP 128/62   Pulse 85   Ht 5\' 6"  (1.676 m)   Wt 139 lb 9.6 oz (63.3 kg)   SpO2 97%   BMI 22.53 kg/m , BMI Body mass index is 22.53 kg/m.  Wt Readings from Last 3 Encounters:  08/26/23 139 lb 9.6 oz (63.3 kg)  08/24/23 140 lb 1.6 oz (63.5 kg)  06/14/23 126 lb 6.4 oz (57.3 kg)    General: Patient appears comfortable at rest. HEENT: Conjunctiva and lids normal, oropharynx clear with moist mucosa. Neck: Supple, no elevated JVP or carotid bruits, no thyromegaly. Lungs: Clear to auscultation, nonlabored breathing at rest. Cardiac: Regular rate and rhythm, no S3 or significant systolic murmur, no pericardial rub. Abdomen: Soft, nontender, no hepatomegaly, bowel sounds present, no guarding or rebound. Extremities: No pitting edema, distal pulses 2+. Skin: Warm and dry. Musculoskeletal: No kyphosis. Neuropsychiatric: Alert and oriented x3, affect grossly appropriate.  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 No murmur on exam, no need of echo.  Taking baby aspirin 81 mg once daily, and continue atorvastatin 40 mg nightly.  Further preop cardiac recommendations pending stress test. -Continue current antihypertensive medications, losartan 100 mg once daily and amlodipine 5 mg once daily.  Addendum on 09/01/2023 Stress test is equivocal, overall low risk study and no large ischemic territories.  Patient is at a moderate risk for any perioperative cardiac complications.  Okay to continue aspirin throughout  the perioperative period.  However if bleeding risk is high, okay to hold aspirin.  No PCI in the last 1 year.   I spent a total duration of 45 minutes reviewing the prior notes, labs, EKG, face-to-face discussion/counseling of his medical condition, pathophysiology, evaluation, management altering test and documenting the findings in the note.   Disposition:  Follow up  6 months  Signed Serenity Batley Verne Spurr, MD, 08/26/2023 3:28 PM    [facility] [address] [place], [place], [place] [address]

## 2023-08-29 ENCOUNTER — Telehealth: Payer: Self-pay | Admitting: *Deleted

## 2023-08-29 ENCOUNTER — Other Ambulatory Visit: Payer: Self-pay | Admitting: Family Medicine

## 2023-08-29 NOTE — Telephone Encounter (Signed)
I will update all parties involved.  

## 2023-08-29 NOTE — Telephone Encounter (Signed)
Pre-operative Risk Assessment    Patient Name: Walter [name]  DOB: [birth year] MRN: [id number]   FYI: I DID S/W KEA, RN EARLIER THIS MORNING ABOUT CLEARANCE. I DID ASK TO PLEASE HAVE A CLEARANCE REQUEST FAXED TO OUR OFFICE AS TO BE SURE THAT WE HAVE COMPLETE NOTES IN THE CHART FOR THE PT. THOUGH THE PT WAS SEEN ALREADY, WE DID NOT HAVE THE OFFICIAL CLEARANCE REQUEST.   I WILL FORWARD TO DR. MALIPEDDI AND PRE OP IF ANY FURTHER NOTES ARE NEEDED.     DATE OF LAST VISIT: 08/26/23 DR. MALLIPEDDI DATE OF NEXT VISIT: NONE  Request for Surgical Clearance    Procedure:   AORTO UNI-ILIAC STENT GRAFT, FEMORAL-FEMORAL BYPASS GRAFT  Date of Surgery:  Clearance TBD                                 Surgeon:  DR. [name] Surgeon's Group or Practice Name:  [facility] Phone number:  [phone] Fax number:  [phone]   Type of Clearance Requested:   - Medical ; PER DR. CAIN PT WILL CONTINUE ON ASA   Type of Anesthesia:  General    Additional requests/questions:    Elpidio Anis   08/29/2023, 1:09 PM

## 2023-08-30 ENCOUNTER — Telehealth (HOSPITAL_COMMUNITY): Payer: Self-pay | Admitting: Surgery

## 2023-08-30 NOTE — Telephone Encounter (Signed)
I spoke to the pt's wife Rhunette Croft to remind the pt of his stress test tomorrow.  I told Rhunette Croft that the pt needs to arrive by 10 am, and then he needs to check in at the front desk and get registered.  I also told the pt's wife that the pt needs to not eat or drink 6 hours prior to the test (no caffeine or chocolate), to hold his Metformin until after the test, to wear comfortable clothes, to not wear any lotions or oils, and to not smoke 8 hours prior to the test.  She verbalized understanding of these instructions.

## 2023-08-31 ENCOUNTER — Encounter (HOSPITAL_BASED_OUTPATIENT_CLINIC_OR_DEPARTMENT_OTHER)
Admission: RE | Admit: 2023-08-31 | Discharge: 2023-08-31 | Disposition: A | Discharge status: Home / Self Care | Payer: Medicare HMO | Source: Ambulatory Visit | Attending: Internal Medicine | Admitting: Internal Medicine

## 2023-08-31 ENCOUNTER — Ambulatory Visit (HOSPITAL_COMMUNITY)
Admission: RE | Admit: 2023-08-31 | Discharge: 2023-08-31 | Disposition: A | Discharge status: Home / Self Care | Payer: Medicare HMO | Source: Ambulatory Visit | Attending: Internal Medicine | Admitting: Internal Medicine

## 2023-08-31 DIAGNOSIS — E119 Type 2 diabetes mellitus without complications: Secondary | ICD-10-CM | POA: Insufficient documentation

## 2023-08-31 DIAGNOSIS — Z01818 Encounter for other preprocedural examination: Secondary | ICD-10-CM | POA: Diagnosis not present

## 2023-08-31 DIAGNOSIS — I10 Essential (primary) hypertension: Secondary | ICD-10-CM | POA: Diagnosis not present

## 2023-08-31 DIAGNOSIS — Z01810 Encounter for preprocedural cardiovascular examination: Secondary | ICD-10-CM | POA: Diagnosis not present

## 2023-08-31 DIAGNOSIS — I713 Abdominal aortic aneurysm, ruptured, unspecified: Secondary | ICD-10-CM | POA: Diagnosis not present

## 2023-08-31 LAB — NM MYOCAR MULTI W/SPECT W/WALL MOTION / EF
LV dias vol: 89 mL (ref 62–150)
LV sys vol: 32 mL
Nuc Stress EF: 64 %
Peak HR: 90 {beats}/min
RATE: 0.5
Rest HR: 69 {beats}/min
Rest Nuclear Isotope Dose: 9.6 mCi
SDS: 3
SRS: 0
SSS: 3
ST Depression (mm): 0 mm
Stress Nuclear Isotope Dose: 28 mCi
TID: 0.94

## 2023-08-31 MED ORDER — SODIUM CHLORIDE FLUSH 0.9 % IV SOLN
INTRAVENOUS | Status: AC
  Administered 2023-08-31: 10 mL via INTRAVENOUS
  Filled 2023-08-31: qty 10

## 2023-08-31 MED ORDER — TECHNETIUM TC 99M TETROFOSMIN IV KIT
28.0000 | PACK | Freq: Once | INTRAVENOUS | Status: AC | PRN
  Administered 2023-08-31: 28 via INTRAVENOUS

## 2023-08-31 MED ORDER — REGADENOSON 0.4 MG/5ML IV SOLN
INTRAVENOUS | Status: AC
  Administered 2023-08-31: 0.4 mg via INTRAVENOUS
  Filled 2023-08-31: qty 5

## 2023-08-31 MED ORDER — TECHNETIUM TC 99M TETROFOSMIN IV KIT
9.6000 | PACK | Freq: Once | INTRAVENOUS | Status: AC | PRN
  Administered 2023-08-31: 9.6 via INTRAVENOUS

## 2023-09-02 ENCOUNTER — Other Ambulatory Visit: Payer: Self-pay

## 2023-09-02 ENCOUNTER — Telehealth: Payer: Self-pay

## 2023-09-02 DIAGNOSIS — I714 Abdominal aortic aneurysm, without rupture, unspecified: Secondary | ICD-10-CM

## 2023-09-02 NOTE — Telephone Encounter (Signed)
Patient(wife-per DPR) informed and verbalized understanding of plan.

## 2023-09-02 NOTE — Telephone Encounter (Signed)
Dr. Antoine Poche note was addended to update pre-op clearance. Routed note to requesting party. Will remove from pre-op pool.   Etta Grandchild. Trimaine Maser, DNP, NP-C  09/02/2023, 10:10 AM [facility] [address] Office [phone] Fax [phone]

## 2023-09-02 NOTE — Telephone Encounter (Signed)
-----   Message from Vishnu P Mallipeddi sent at 09/01/2023 11:11 AM EDT ----- Equivocal stress test but low risk study. Overall, patient is at a moderate risk for any peri-operative cardiac complications if he undergoes AAA repair. Continue aspirin 81 mg once daily due to CAD history. No need to hold for the surgery. If high risk for bleeding, okay to hold aspirin prior to the surgery.

## 2023-09-08 NOTE — Pre-Procedure Instructions (Addendum)
Surgical Instructions   Your procedure is scheduled on September 16, 2023. Report to [facility] Main Entrance "A" at 5:30 A.M., then check in with the Admitting office. Any questions or running late day of surgery: call [phone]  Questions prior to your surgery date: call [phone], Monday-Friday, 8am-4pm. If you experience any cold or flu symptoms such as cough, fever, chills, shortness of breath, etc. between now and your scheduled surgery, please notify us at the above number.     Remember:  Do not eat or drink after midnight the night before your surgery    Take these medicines the morning of surgery with A SIP OF WATER: amLODipine (NORVASC)  aspirin EC  atorvastatin (LIPITOR)  loratadine (ALLERGY RELIEF)  memantine (NAMENDA)  pantoprazole (PROTONIX)    One week prior to surgery, STOP taking any Aleve, Naproxen, Ibuprofen, Motrin, Advil, Goody's, BC's, all herbal medications, fish oil, and non-prescription vitamins.   DO NOT take your metformin morning of surgery.  HOW TO MANAGE YOUR DIABETES BEFORE AND AFTER SURGERY  Why is it important to control my blood sugar before and after surgery? Improving blood sugar levels before and after surgery helps healing and can limit problems. A way of improving blood sugar control is eating a healthy diet by:  Eating less sugar and carbohydrates  Increasing activity/exercise  Talking with your doctor about reaching your blood sugar goals High blood sugars (greater than 180 mg/dL) can raise your risk of infections and slow your recovery, so you will need to focus on controlling your diabetes during the weeks before surgery. Make sure that the doctor who takes care of your diabetes knows about your planned surgery including the date and location.  How do I manage my blood sugar before surgery? Check your blood sugar at least 4 times a day, starting 2 days before surgery, to make sure that the level is not too high or low.  Check  your blood sugar the morning of your surgery when you wake up and every 2 hours until you get to the Short Stay unit.  If your blood sugar is less than 70 mg/dL, you will need to treat for low blood sugar: Do not take insulin. Treat a low blood sugar (less than 70 mg/dL) with  cup of clear juice (cranberry or apple), 4 glucose tablets, OR glucose gel. Recheck blood sugar in 15 minutes after treatment (to make sure it is greater than 70 mg/dL). If your blood sugar is not greater than 70 mg/dL on recheck, call [phone] for further instructions. Report your blood sugar to the short stay nurse when you get to Short Stay.  If you are admitted to the hospital after surgery: Your blood sugar will be checked by the staff and you will probably be given insulin after surgery (instead of oral diabetes medicines) to make sure you have good blood sugar levels. The goal for blood sugar control after surgery is 80-180 mg/dL.                      Do NOT Smoke (Tobacco/Vaping) for 24 hours prior to your procedure.  If you use a CPAP at night, you may bring your mask/headgear for your overnight stay.   You will be asked to remove any contacts, glasses, piercing's, hearing aid's, dentures/partials prior to surgery. Please bring cases for these items if needed.    Patients discharged the day of surgery will not be allowed to drive home, and someone needs to stay  with them for 24 hours.  SURGICAL WAITING ROOM VISITATION Patients may have no more than 2 support people in the waiting area - these visitors may rotate.   Pre-op nurse will coordinate an appropriate time for 1 ADULT support person, who may not rotate, to accompany patient in pre-op.  Children under the age of 62 must have an adult with them who is not the patient and must remain in the main waiting area with an adult.  If the patient needs to stay at the hospital during part of their recovery, the visitor guidelines for inpatient rooms  apply.  Please refer to the [facility] website for the visitor guidelines for any additional information.   If you received a COVID test during your pre-op visit  it is requested that you wear a mask when out in public, stay away from anyone that may not be feeling well and notify your surgeon if you develop symptoms. If you have been in contact with anyone that has tested positive in the last 10 days please notify you surgeon.      Pre-operative CHG Bathing Instructions   You can play a key role in reducing the risk of infection after surgery. Your skin needs to be as free of germs as possible. You can reduce the number of germs on your skin by washing with CHG (chlorhexidine gluconate) soap before surgery. CHG is an antiseptic soap that kills germs and continues to kill germs even after washing.   DO NOT use if you have an allergy to chlorhexidine/CHG or antibacterial soaps. If your skin becomes reddened or irritated, stop using the CHG and notify one of our RNs at [phone].              TAKE A SHOWER THE NIGHT BEFORE SURGERY AND THE DAY OF SURGERY    Please keep in mind the following:  DO NOT shave, including legs and underarms, 48 hours prior to surgery.   You may shave your face before/day of surgery.  Place clean sheets on your bed the night before surgery Use a clean washcloth (not used since being washed) for each shower. DO NOT sleep with pet's night before surgery.  CHG Shower Instructions:  Wash your face and private area with normal soap. If you choose to wash your hair, wash first with your normal shampoo.  After you use shampoo/soap, rinse your hair and body thoroughly to remove shampoo/soap residue.  Turn the water OFF and apply half the bottle of CHG soap to a CLEAN washcloth.  Apply CHG soap ONLY FROM YOUR NECK DOWN TO YOUR TOES (washing for 3-5 minutes)  DO NOT use CHG soap on face, private areas, open wounds, or sores.  Pay special attention to the area where  your surgery is being performed.  If you are having back surgery, having someone wash your back for you may be helpful. Wait 2 minutes after CHG soap is applied, then you may rinse off the CHG soap.  Pat dry with a clean towel  Put on clean pajamas    Additional instructions for the day of surgery: DO NOT APPLY any lotions, deodorants, cologne, or perfumes.   Do not wear jewelry or makeup Do not wear nail polish, gel polish, artificial nails, or any other type of covering on natural nails (fingers and toes) Do not bring valuables to the hospital. [facility] is not responsible for valuables/personal belongings. Put on clean/comfortable clothes.  Please brush your teeth.  Ask your nurse  before applying any prescription medications to the skin.

## 2023-09-09 ENCOUNTER — Encounter (HOSPITAL_COMMUNITY): Payer: Self-pay

## 2023-09-09 ENCOUNTER — Encounter (HOSPITAL_COMMUNITY)
Admission: RE | Admit: 2023-09-09 | Discharge: 2023-09-09 | Disposition: A | Discharge status: Home / Self Care | Payer: Medicare HMO | Source: Ambulatory Visit | Attending: Vascular Surgery

## 2023-09-09 ENCOUNTER — Other Ambulatory Visit: Payer: Self-pay

## 2023-09-09 VITALS — BP 130/65 | HR 85 | Temp 98.4°F | Resp 17 | Ht 67.0 in | Wt 138.8 lb

## 2023-09-09 DIAGNOSIS — Z01812 Encounter for preprocedural laboratory examination: Secondary | ICD-10-CM | POA: Diagnosis present

## 2023-09-09 DIAGNOSIS — E119 Type 2 diabetes mellitus without complications: Secondary | ICD-10-CM | POA: Diagnosis not present

## 2023-09-09 DIAGNOSIS — I714 Abdominal aortic aneurysm, without rupture, unspecified: Secondary | ICD-10-CM | POA: Diagnosis not present

## 2023-09-09 DIAGNOSIS — Z01818 Encounter for other preprocedural examination: Secondary | ICD-10-CM

## 2023-09-09 HISTORY — DX: Personal history of other diseases of the digestive system: Z87.19

## 2023-09-09 LAB — COMPREHENSIVE METABOLIC PANEL
ALT: 25 U/L (ref 0–44)
AST: 29 U/L (ref 15–41)
Albumin: 3.8 g/dL (ref 3.5–5.0)
Alkaline Phosphatase: 82 U/L (ref 38–126)
Anion gap: 11 (ref 5–15)
BUN: 18 mg/dL (ref 8–23)
CO2: 24 mmol/L (ref 22–32)
Calcium: 9.6 mg/dL (ref 8.9–10.3)
Chloride: 106 mmol/L (ref 98–111)
Creatinine, Ser: 1.12 mg/dL (ref 0.61–1.24)
GFR, Estimated: >60 mL/min (ref >60)
Glucose, Bld: 119 mg/dL — ABNORMAL HIGH (ref 70–99)
Potassium: 4 mmol/L (ref 3.5–5.1)
Sodium: 141 mmol/L (ref 135–145)
Total Bilirubin: 0.6 mg/dL (ref <1.2)
Total Protein: 6.9 g/dL (ref 6.5–8.1)

## 2023-09-09 LAB — HEMOGLOBIN A1C
Hgb A1c MFr Bld: 5.7 % — ABNORMAL HIGH (ref 4.8–5.6)
Mean Plasma Glucose: 116.89 mg/dL

## 2023-09-09 LAB — CBC
HCT: 34.9 % — ABNORMAL LOW (ref 39.0–52.0)
Hemoglobin: 10.7 g/dL — ABNORMAL LOW (ref 13.0–17.0)
MCH: 29.4 pg (ref 26.0–34.0)
MCHC: 30.7 g/dL (ref 30.0–36.0)
MCV: 95.9 fL (ref 80.0–100.0)
Platelets: 235 10*3/uL (ref 150–400)
RBC: 3.64 MIL/uL — ABNORMAL LOW (ref 4.22–5.81)
RDW: 12.7 % (ref 11.5–15.5)
WBC: 7.8 10*3/uL (ref 4.0–10.5)
nRBC: 0 % (ref 0.0–0.2)

## 2023-09-09 LAB — URINALYSIS, ROUTINE W REFLEX MICROSCOPIC
Bilirubin Urine: NEGATIVE
Glucose, UA: NEGATIVE mg/dL
Hgb urine dipstick: NEGATIVE
Ketones, ur: NEGATIVE mg/dL
Leukocytes,Ua: NEGATIVE
Nitrite: NEGATIVE
Protein, ur: NEGATIVE mg/dL
Specific Gravity, Urine: 1.018 (ref 1.005–1.030)
pH: 5 (ref 5.0–8.0)

## 2023-09-09 LAB — APTT: aPTT: 28 s (ref 24–36)

## 2023-09-09 LAB — SURGICAL PCR SCREEN
MRSA, PCR: NEGATIVE
Staphylococcus aureus: NEGATIVE

## 2023-09-09 LAB — PROTIME-INR
INR: 1 (ref 0.8–1.2)
Prothrombin Time: 13.4 s (ref 11.4–15.2)

## 2023-09-09 LAB — GLUCOSE, CAPILLARY: Glucose-Capillary: 168 mg/dL — ABNORMAL HIGH (ref 70–99)

## 2023-09-09 NOTE — Progress Notes (Signed)
PCP - Dr. Evelena Peat Cardiologist - None - Pt saw Dr. Luane School 08/26/2023 for cardiac clearance  PPM/ICD - Denies Device Orders - n/a Rep Notified - n/a  Chest x-ray - n/a EKG - 08/26/2023 Stress Test - 08/31/2023 ECHO - Per pt, last echo was around time of heart cath Cardiac Cath - 2004 per pt - EKG abnormal per PCP and sent for cath. One stent placed.  Sleep Study - Denies CPAP - n/a  Pt is DM2. He checks his blood sugar every morning. Normal fasting levels 100-110s. CBG at pre-op 168. A1c result pending.  Last dose of GLP1 agonist-  n/a GLP1 instructions: n/a  Blood Thinner Instructions: n/a Aspirin Instructions: Pt will continue ASA through DOS  NPO after midnight  COVID TEST- n/a   Anesthesia review: Yes. Cardiac Clearance.   Patient denies shortness of breath, fever, cough and chest pain at PAT appointment. Pt denies any respiratory illness/infection in the last two months.   All instructions explained to the patient, with a verbal understanding of the material. Patient agrees to go over the instructions while at home for a better understanding. Patient also instructed to self quarantine after being tested for COVID-19. The opportunity to ask questions was provided.

## 2023-09-10 ENCOUNTER — Other Ambulatory Visit: Payer: Self-pay | Admitting: Family Medicine

## 2023-09-10 DIAGNOSIS — E785 Hyperlipidemia, unspecified: Secondary | ICD-10-CM

## 2023-09-12 NOTE — Progress Notes (Signed)
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   He was referred to cardiology for preop risk eval. Seen by Dr. Jenene Slicker 08/26/23. Lexiscan was ordered. Stress test 08/31/23 was equivocal but overall low risk. Dr. Keane Scrape discussed findings in addendum 09/01/23, "Stress test is equivocal, overall low risk study and no large ischemic territories.  Patient is at a moderate risk for any perioperative cardiac complications.  Okay to continue aspirin throughout the perioperative period.  However if bleeding risk is high, okay to hold aspirin.  No PCI in the last 1 year."  NIDDM2, A1c on preop labs 5.7.  Preop labs reviewed, mild anemia Hgb 10.7, otherwise urnemarkable.   EKG 08/26/23: Normal sinus rhythm. Rate 81. Rightward axis. Nonspecific ST abnormality  Nuclear stress 08/31/23:   Findings are equivocal. The study is low risk.   No ST deviation was noted. The ECG was negative for ischemia.   LV perfusion is equivocal.  Moderate sized, moderate intensity, mid to apical inferoseptal/inferior perfusion defect that is fixed and most prominent on rest imaging consistent with diaphragmatic attenuation.  There is a trivial reversible inferoapical defect that is most likely related to attenuation artifact as well.  No large ischemic territories.   Left ventricular function is normal. Nuclear stress EF: 64%.   Equivocal but low risk study with suspected diaphragmatic attenuation and variable inferoapical attenuation artifact.  No large ischemic territories.  LVEF 64%.    Zannie Cove [facility] Short Stay Center/Anesthesiology Phone [phone] 09/12/2023 4:02 PM

## 2023-09-12 NOTE — Anesthesia Preprocedure Evaluation (Addendum)
Anesthesia Evaluation  Patient identified by MRN, date of birth, ID band Patient awake    Reviewed: Allergy & Precautions, NPO status , Patient's Chart, lab work & pertinent test results  Airway Mallampati: II  TM Distance: >3 FB Neck ROM: Full    Dental no notable dental hx.    Pulmonary former smoker   Pulmonary exam normal        Cardiovascular hypertension, Pt. on medications + CAD, + Cardiac Stents and + Peripheral Vascular Disease   Rhythm:Regular Rate:Normal    Findings are equivocal. The study is low risk.   No ST deviation was noted. The ECG was negative for ischemia.   LV perfusion is equivocal.  Moderate sized, moderate intensity, mid to apical inferoseptal/inferior perfusion defect that is fixed and most prominent on rest imaging consistent with diaphragmatic attenuation.  There is a trivial reversible inferoapical defect that is most likely related to attenuation artifact as well.  No large ischemic territories.   Left ventricular function is normal. Nuclear stress EF: 64%.   Equivocal but low risk study with suspected diaphragmatic attenuation and variable inferoapical attenuation artifact.  No large ischemic territories.  LVEF 64%.    Neuro/Psych       Dementia negative neurological ROS     GI/Hepatic Neg liver ROS, hiatal hernia,GERD  Medicated,,  Endo/Other  diabetes, Type 2, Oral Hypoglycemic Agents    Renal/GU negative Renal ROS  negative genitourinary   Musculoskeletal negative musculoskeletal ROS (+)    Abdominal Normal abdominal exam  (+)   Peds  Hematology  (+) Blood dyscrasia, anemia Lab Results      Component                Value               Date                      WBC                      7.8                 09/09/2023                HGB                      10.7 (L)            09/09/2023                HCT                      34.9 (L)            09/09/2023                MCV                       95.9                09/09/2023                PLT                      235                 09/09/2023              Anesthesia Other Findings  Reproductive/Obstetrics                             Anesthesia Physical Anesthesia Plan  ASA: 3  Anesthesia Plan: General   Post-op Pain Management:    Induction: Intravenous  PONV Risk Score and Plan: 2 and Ondansetron, Dexamethasone and Treatment may vary due to age or medical condition  Airway Management Planned: Mask and Oral ETT  Additional Equipment: Arterial line  Intra-op Plan:   Post-operative Plan: Extubation in OR  Informed Consent: I have reviewed the patients History and Physical, chart, labs and discussed the procedure including the risks, benefits and alternatives for the proposed anesthesia with the patient or authorized representative who has indicated his/her understanding and acceptance.     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   He was referred to cardiology for preop risk eval. Seen by Dr. Jenene Slicker 08/26/23. Lexiscan was ordered. Stress test 08/31/23 was equivocal but overall low risk. Dr. Keane Scrape discussed findings in addendum 09/01/23, "Stress test is equivocal, overall low risk study and no large ischemic territories.  Patient is at a moderate risk for any perioperative cardiac complications.  Okay to continue aspirin throughout the perioperative period.  However if bleeding risk is high, okay to hold aspirin.  No PCI in the last 1 year."  NIDDM2, A1c on preop labs 5.7.  Preop labs reviewed, mild anemia Hgb 10.7, otherwise urnemarkable.   EKG 08/26/23: Normal  sinus rhythm. Rate 81. Rightward axis. Nonspecific ST abnormality  Nuclear stress 08/31/23:   Findings are equivocal. The study is low risk.   No ST deviation was noted. The ECG was negative for ischemia.   LV perfusion is equivocal.  Moderate sized, moderate intensity, mid to apical inferoseptal/inferior perfusion defect that is fixed and most prominent on rest imaging consistent with diaphragmatic attenuation.  There is a trivial reversible inferoapical defect that is most likely related to attenuation artifact as well.  No large ischemic territories.   Left ventricular function is normal. Nuclear stress EF: 64%.  Equivocal but low risk study with suspected diaphragmatic attenuation and variable inferoapical attenuation artifact.  No large ischemic territories.  LVEF 64%.  )        Anesthesia Quick Evaluation

## 2023-09-15 ENCOUNTER — Other Ambulatory Visit: Payer: Self-pay | Admitting: Family Medicine

## 2023-09-15 DIAGNOSIS — E1151 Type 2 diabetes mellitus with diabetic peripheral angiopathy without gangrene: Secondary | ICD-10-CM

## 2023-09-15 DIAGNOSIS — I10 Essential (primary) hypertension: Secondary | ICD-10-CM

## 2023-09-16 ENCOUNTER — Inpatient Hospital Stay (HOSPITAL_COMMUNITY): Payer: Medicare HMO

## 2023-09-16 ENCOUNTER — Encounter (HOSPITAL_COMMUNITY)
Admission: RE | Disposition: A | Discharge status: Rehabilitation Facility | Payer: Self-pay | Source: Home / Self Care | Attending: Vascular Surgery

## 2023-09-16 ENCOUNTER — Other Ambulatory Visit: Payer: Self-pay

## 2023-09-16 ENCOUNTER — Inpatient Hospital Stay (HOSPITAL_COMMUNITY): Payer: Medicare HMO | Admitting: Physician Assistant

## 2023-09-16 ENCOUNTER — Inpatient Hospital Stay (HOSPITAL_COMMUNITY): Payer: Medicare HMO | Admitting: Anesthesiology

## 2023-09-16 ENCOUNTER — Encounter (HOSPITAL_COMMUNITY): Payer: Self-pay | Admitting: Vascular Surgery

## 2023-09-16 ENCOUNTER — Inpatient Hospital Stay (HOSPITAL_COMMUNITY)
Admission: RE | Admit: 2023-09-16 | Discharge: 2023-09-21 | DRG: 270 | Disposition: A | Discharge status: Rehabilitation Facility | Payer: Medicare HMO | Attending: Vascular Surgery | Admitting: Vascular Surgery

## 2023-09-16 DIAGNOSIS — E875 Hyperkalemia: Secondary | ICD-10-CM | POA: Diagnosis present

## 2023-09-16 DIAGNOSIS — Z48812 Encounter for surgical aftercare following surgery on the circulatory system: Secondary | ICD-10-CM | POA: Diagnosis not present

## 2023-09-16 DIAGNOSIS — Z87891 Personal history of nicotine dependence: Secondary | ICD-10-CM | POA: Diagnosis not present

## 2023-09-16 DIAGNOSIS — E872 Acidosis, unspecified: Secondary | ICD-10-CM | POA: Diagnosis present

## 2023-09-16 DIAGNOSIS — I714 Abdominal aortic aneurysm, without rupture, unspecified: Secondary | ICD-10-CM

## 2023-09-16 DIAGNOSIS — Z8679 Personal history of other diseases of the circulatory system: Principal | ICD-10-CM

## 2023-09-16 DIAGNOSIS — D509 Iron deficiency anemia, unspecified: Secondary | ICD-10-CM | POA: Diagnosis not present

## 2023-09-16 DIAGNOSIS — G20B2 Parkinson's disease with dyskinesia, with fluctuations: Secondary | ICD-10-CM | POA: Diagnosis not present

## 2023-09-16 DIAGNOSIS — Z803 Family history of malignant neoplasm of breast: Secondary | ICD-10-CM

## 2023-09-16 DIAGNOSIS — F0390 Unspecified dementia without behavioral disturbance: Secondary | ICD-10-CM | POA: Diagnosis not present

## 2023-09-16 DIAGNOSIS — Z86718 Personal history of other venous thrombosis and embolism: Secondary | ICD-10-CM

## 2023-09-16 DIAGNOSIS — Z85828 Personal history of other malignant neoplasm of skin: Secondary | ICD-10-CM | POA: Diagnosis not present

## 2023-09-16 DIAGNOSIS — L03314 Cellulitis of groin: Secondary | ICD-10-CM | POA: Diagnosis not present

## 2023-09-16 DIAGNOSIS — Z95828 Presence of other vascular implants and grafts: Secondary | ICD-10-CM

## 2023-09-16 DIAGNOSIS — I10 Essential (primary) hypertension: Secondary | ICD-10-CM | POA: Diagnosis not present

## 2023-09-16 DIAGNOSIS — K449 Diaphragmatic hernia without obstruction or gangrene: Secondary | ICD-10-CM | POA: Diagnosis not present

## 2023-09-16 DIAGNOSIS — D62 Acute posthemorrhagic anemia: Secondary | ICD-10-CM | POA: Diagnosis not present

## 2023-09-16 DIAGNOSIS — E43 Unspecified severe protein-calorie malnutrition: Secondary | ICD-10-CM | POA: Diagnosis present

## 2023-09-16 DIAGNOSIS — D649 Anemia, unspecified: Secondary | ICD-10-CM | POA: Diagnosis not present

## 2023-09-16 DIAGNOSIS — D696 Thrombocytopenia, unspecified: Secondary | ICD-10-CM | POA: Diagnosis not present

## 2023-09-16 DIAGNOSIS — I9789 Other postprocedural complications and disorders of the circulatory system, not elsewhere classified: Secondary | ICD-10-CM | POA: Diagnosis not present

## 2023-09-16 DIAGNOSIS — Z820 Family history of epilepsy and other diseases of the nervous system: Secondary | ICD-10-CM

## 2023-09-16 DIAGNOSIS — T8110XA Postprocedural shock unspecified, initial encounter: Secondary | ICD-10-CM | POA: Diagnosis not present

## 2023-09-16 DIAGNOSIS — Z86711 Personal history of pulmonary embolism: Secondary | ICD-10-CM

## 2023-09-16 DIAGNOSIS — K264 Chronic or unspecified duodenal ulcer with hemorrhage: Secondary | ICD-10-CM | POA: Diagnosis not present

## 2023-09-16 DIAGNOSIS — E876 Hypokalemia: Secondary | ICD-10-CM | POA: Diagnosis not present

## 2023-09-16 DIAGNOSIS — K5520 Angiodysplasia of colon without hemorrhage: Secondary | ICD-10-CM | POA: Diagnosis not present

## 2023-09-16 DIAGNOSIS — R5381 Other malaise: Secondary | ICD-10-CM | POA: Diagnosis not present

## 2023-09-16 DIAGNOSIS — Z6821 Body mass index (BMI) 21.0-21.9, adult: Secondary | ICD-10-CM

## 2023-09-16 DIAGNOSIS — N179 Acute kidney failure, unspecified: Secondary | ICD-10-CM | POA: Diagnosis not present

## 2023-09-16 DIAGNOSIS — Z66 Do not resuscitate: Secondary | ICD-10-CM | POA: Diagnosis not present

## 2023-09-16 DIAGNOSIS — F0280 Dementia in other diseases classified elsewhere without behavioral disturbance: Secondary | ICD-10-CM | POA: Diagnosis present

## 2023-09-16 DIAGNOSIS — Z9889 Other specified postprocedural states: Secondary | ICD-10-CM | POA: Diagnosis not present

## 2023-09-16 DIAGNOSIS — E119 Type 2 diabetes mellitus without complications: Secondary | ICD-10-CM | POA: Diagnosis not present

## 2023-09-16 DIAGNOSIS — Z7984 Long term (current) use of oral hypoglycemic drugs: Secondary | ICD-10-CM

## 2023-09-16 DIAGNOSIS — Z955 Presence of coronary angioplasty implant and graft: Secondary | ICD-10-CM

## 2023-09-16 DIAGNOSIS — R739 Hyperglycemia, unspecified: Secondary | ICD-10-CM | POA: Diagnosis not present

## 2023-09-16 DIAGNOSIS — Z03823 Encounter for observation for suspected inserted (injected) foreign body ruled out: Secondary | ICD-10-CM | POA: Diagnosis not present

## 2023-09-16 DIAGNOSIS — E1151 Type 2 diabetes mellitus with diabetic peripheral angiopathy without gangrene: Secondary | ICD-10-CM | POA: Diagnosis present

## 2023-09-16 DIAGNOSIS — Z79899 Other long term (current) drug therapy: Secondary | ICD-10-CM | POA: Diagnosis not present

## 2023-09-16 DIAGNOSIS — K219 Gastro-esophageal reflux disease without esophagitis: Secondary | ICD-10-CM | POA: Diagnosis present

## 2023-09-16 DIAGNOSIS — E785 Hyperlipidemia, unspecified: Secondary | ICD-10-CM | POA: Diagnosis not present

## 2023-09-16 DIAGNOSIS — G20A1 Parkinson's disease without dyskinesia, without mention of fluctuations: Secondary | ICD-10-CM | POA: Diagnosis present

## 2023-09-16 DIAGNOSIS — K31811 Angiodysplasia of stomach and duodenum with bleeding: Secondary | ICD-10-CM | POA: Diagnosis not present

## 2023-09-16 DIAGNOSIS — I2510 Atherosclerotic heart disease of native coronary artery without angina pectoris: Secondary | ICD-10-CM | POA: Diagnosis not present

## 2023-09-16 HISTORY — PX: ANEURYSM COILING: SHX5349

## 2023-09-16 HISTORY — PX: ABDOMINAL AORTIC ENDOVASCULAR STENT GRAFT: SHX5707

## 2023-09-16 HISTORY — PX: ENDARTERECTOMY FEMORAL: SHX5804

## 2023-09-16 HISTORY — PX: BYPASS GRAFT FEMORAL-PERONEAL: SHX5762

## 2023-09-16 HISTORY — PX: ENDARTERECTOMY: SHX5162

## 2023-09-16 HISTORY — PX: FEMORAL-FEMORAL BYPASS GRAFT: SHX936

## 2023-09-16 LAB — PROTIME-INR
INR: 1.4 — ABNORMAL HIGH (ref 0.8–1.2)
Prothrombin Time: 17 s — ABNORMAL HIGH (ref 11.4–15.2)

## 2023-09-16 LAB — CBC
HCT: 30.1 % — ABNORMAL LOW (ref 39.0–52.0)
HCT: 32.4 % — ABNORMAL LOW (ref 39.0–52.0)
Hemoglobin: 9.9 g/dL — ABNORMAL LOW (ref 13.0–17.0)
Hemoglobin: 10.7 g/dL — ABNORMAL LOW (ref 13.0–17.0)
MCH: 29.9 pg (ref 26.0–34.0)
MCH: 30.1 pg (ref 26.0–34.0)
MCHC: 32.9 g/dL (ref 30.0–36.0)
MCHC: 33 g/dL (ref 30.0–36.0)
MCV: 90.9 fL (ref 80.0–100.0)
MCV: 91 fL (ref 80.0–100.0)
Platelets: 154 10*3/uL (ref 150–400)
Platelets: 176 10*3/uL (ref 150–400)
RBC: 3.31 MIL/uL — ABNORMAL LOW (ref 4.22–5.81)
RBC: 3.56 MIL/uL — ABNORMAL LOW (ref 4.22–5.81)
RDW: 13.5 % (ref 11.5–15.5)
RDW: 13.8 % (ref 11.5–15.5)
WBC: 19.3 10*3/uL — ABNORMAL HIGH (ref 4.0–10.5)
WBC: 27.4 10*3/uL — ABNORMAL HIGH (ref 4.0–10.5)
nRBC: 0 % (ref 0.0–0.2)
nRBC: 0 % (ref 0.0–0.2)

## 2023-09-16 LAB — BASIC METABOLIC PANEL
Anion gap: 11 (ref 5–15)
BUN: 25 mg/dL — ABNORMAL HIGH (ref 8–23)
CO2: 18 mmol/L — ABNORMAL LOW (ref 22–32)
Calcium: 8.5 mg/dL — ABNORMAL LOW (ref 8.9–10.3)
Chloride: 110 mmol/L (ref 98–111)
Creatinine, Ser: 1.65 mg/dL — ABNORMAL HIGH (ref 0.61–1.24)
GFR, Estimated: 42 mL/min — ABNORMAL LOW (ref >60)
Glucose, Bld: 234 mg/dL — ABNORMAL HIGH (ref 70–99)
Potassium: 5.3 mmol/L — ABNORMAL HIGH (ref 3.5–5.1)
Sodium: 139 mmol/L (ref 135–145)

## 2023-09-16 LAB — CREATININE, SERUM
Creatinine, Ser: 1.06 mg/dL (ref 0.61–1.24)
GFR, Estimated: >60 mL/min (ref >60)

## 2023-09-16 LAB — APTT: aPTT: 34 s (ref 24–36)

## 2023-09-16 LAB — MAGNESIUM: Magnesium: 1.4 mg/dL — ABNORMAL LOW (ref 1.7–2.4)

## 2023-09-16 LAB — PREPARE RBC (CROSSMATCH)

## 2023-09-16 LAB — GLUCOSE, CAPILLARY
Glucose-Capillary: 97 mg/dL (ref 70–99)
Glucose-Capillary: 225 mg/dL — ABNORMAL HIGH (ref 70–99)
Glucose-Capillary: 219 mg/dL — ABNORMAL HIGH (ref 70–99)

## 2023-09-16 SURGERY — INSERTION, ENDOVASCULAR STENT GRAFT, AORTA, ABDOMINAL
Anesthesia: General | Site: Groin | Laterality: Right

## 2023-09-16 MED ORDER — FENTANYL CITRATE (PF) 250 MCG/5ML IJ SOLN
INTRAMUSCULAR | Status: DC | PRN
  Administered 2023-09-16: 50 ug via INTRAVENOUS
  Administered 2023-09-16: 100 ug via INTRAVENOUS
  Administered 2023-09-16: 50 ug via INTRAVENOUS
  Administered 2023-09-16: 100 ug via INTRAVENOUS

## 2023-09-16 MED ORDER — LIDOCAINE 2% (20 MG/ML) 5 ML SYRINGE
INTRAMUSCULAR | Status: AC
  Filled 2023-09-16: qty 5

## 2023-09-16 MED ORDER — SODIUM CHLORIDE 0.9% FLUSH
10.0000 mL | Freq: Two times a day (BID) | INTRAVENOUS | Status: DC
  Administered 2023-09-17 – 2023-09-20 (×7): 10 mL via INTRAVENOUS
  Administered 2023-09-20: 3 mL via INTRAVENOUS
  Administered 2023-09-21: 10 mL via INTRAVENOUS

## 2023-09-16 MED ORDER — ONDANSETRON HCL 4 MG/2ML IJ SOLN
INTRAMUSCULAR | Status: DC | PRN
  Administered 2023-09-16: 4 mg via INTRAVENOUS

## 2023-09-16 MED ORDER — BISACODYL 5 MG PO TBEC: 5.0000 mg | DELAYED_RELEASE_TABLET | Freq: Every day | ORAL | Status: DC | PRN

## 2023-09-16 MED ORDER — ORAL CARE MOUTH RINSE: 15.0000 mL | Freq: Once | OROMUCOSAL | Status: AC

## 2023-09-16 MED ORDER — PROTAMINE SULFATE 10 MG/ML IV SOLN
INTRAVENOUS | Status: DC | PRN
  Administered 2023-09-16: 25 mg via INTRAVENOUS

## 2023-09-16 MED ORDER — PHENOL 1.4 % MT LIQD: 1.0000 | OROMUCOSAL | Status: DC | PRN

## 2023-09-16 MED ORDER — METOPROLOL TARTRATE 5 MG/5ML IV SOLN: 2.0000 mg | INTRAVENOUS | Status: DC | PRN

## 2023-09-16 MED ORDER — HEMOSTATIC AGENTS (NO CHARGE) OPTIME
TOPICAL | Status: DC | PRN
  Administered 2023-09-16: 2 via TOPICAL

## 2023-09-16 MED ORDER — HYDROMORPHONE HCL 1 MG/ML IJ SOLN
INTRAMUSCULAR | Status: AC
  Filled 2023-09-16: qty 0.5

## 2023-09-16 MED ORDER — PHENYLEPHRINE HCL-NACL 20-0.9 MG/250ML-% IV SOLN
INTRAVENOUS | Status: DC | PRN
  Administered 2023-09-16: 20 ug/min via INTRAVENOUS
  Administered 2023-09-16: 25 ug/min via INTRAVENOUS

## 2023-09-16 MED ORDER — ALBUMIN HUMAN 5 % IV SOLN: INTRAVENOUS | Status: DC | PRN

## 2023-09-16 MED ORDER — EPHEDRINE SULFATE-NACL 50-0.9 MG/10ML-% IV SOSY
PREFILLED_SYRINGE | INTRAVENOUS | Status: DC | PRN
  Administered 2023-09-16 (×2): 10 mg via INTRAVENOUS
  Administered 2023-09-16: 5 mg via INTRAVENOUS
  Administered 2023-09-16 (×3): 10 mg via INTRAVENOUS

## 2023-09-16 MED ORDER — SODIUM CHLORIDE 0.9 % IV SOLN: 500.0000 mL | Freq: Once | INTRAVENOUS | Status: DC | PRN

## 2023-09-16 MED ORDER — HYDROMORPHONE HCL 1 MG/ML IJ SOLN
INTRAMUSCULAR | Status: DC | PRN
  Administered 2023-09-16 (×2): .5 mg via INTRAVENOUS

## 2023-09-16 MED ORDER — PHENYLEPHRINE 80 MCG/ML (10ML) SYRINGE FOR IV PUSH (FOR BLOOD PRESSURE SUPPORT)
PREFILLED_SYRINGE | INTRAVENOUS | Status: AC
  Filled 2023-09-16: qty 10

## 2023-09-16 MED ORDER — PROPOFOL 10 MG/ML IV BOLUS
INTRAVENOUS | Status: AC
  Filled 2023-09-16: qty 20

## 2023-09-16 MED ORDER — ACETAMINOPHEN 325 MG PO TABS
325.0000 mg | ORAL_TABLET | ORAL | Status: DC | PRN
  Administered 2023-09-17 – 2023-09-21 (×2): 650 mg via ORAL
  Filled 2023-09-16 (×2): qty 2

## 2023-09-16 MED ORDER — LIDOCAINE 2% (20 MG/ML) 5 ML SYRINGE
INTRAMUSCULAR | Status: DC | PRN
  Administered 2023-09-16: 100 mg via INTRAVENOUS

## 2023-09-16 MED ORDER — DEXAMETHASONE SODIUM PHOSPHATE 10 MG/ML IJ SOLN
INTRAMUSCULAR | Status: DC | PRN
  Administered 2023-09-16: 10 mg via INTRAVENOUS

## 2023-09-16 MED ORDER — HEPARIN 6000 UNIT IRRIGATION SOLUTION
Status: AC
  Filled 2023-09-16: qty 500

## 2023-09-16 MED ORDER — CEFAZOLIN SODIUM-DEXTROSE 2-4 GM/100ML-% IV SOLN
2.0000 g | INTRAVENOUS | Status: AC
  Administered 2023-09-16 (×2): 2 g via INTRAVENOUS
  Filled 2023-09-16: qty 100

## 2023-09-16 MED ORDER — PHENYLEPHRINE 80 MCG/ML (10ML) SYRINGE FOR IV PUSH (FOR BLOOD PRESSURE SUPPORT)
PREFILLED_SYRINGE | INTRAVENOUS | Status: DC | PRN
  Administered 2023-09-16: 160 ug via INTRAVENOUS
  Administered 2023-09-16 (×3): 80 ug via INTRAVENOUS

## 2023-09-16 MED ORDER — ONDANSETRON HCL 4 MG/2ML IJ SOLN
INTRAMUSCULAR | Status: AC
  Filled 2023-09-16: qty 2

## 2023-09-16 MED ORDER — ROCURONIUM BROMIDE 10 MG/ML (PF) SYRINGE
PREFILLED_SYRINGE | INTRAVENOUS | Status: DC | PRN
  Administered 2023-09-16: 30 mg via INTRAVENOUS
  Administered 2023-09-16: 20 mg via INTRAVENOUS
  Administered 2023-09-16: 50 mg via INTRAVENOUS

## 2023-09-16 MED ORDER — LABETALOL HCL 5 MG/ML IV SOLN
INTRAVENOUS | Status: DC | PRN
  Administered 2023-09-16 (×3): 5 mg via INTRAVENOUS

## 2023-09-16 MED ORDER — ACETAMINOPHEN 10 MG/ML IV SOLN
1000.0000 mg | Freq: Once | INTRAVENOUS | Status: DC | PRN
Start: 2023-09-16 — End: 2023-09-16

## 2023-09-16 MED ORDER — ONDANSETRON HCL 4 MG/2ML IJ SOLN
4.0000 mg | Freq: Four times a day (QID) | INTRAMUSCULAR | Status: DC | PRN
  Administered 2023-09-16: 4 mg via INTRAVENOUS
  Filled 2023-09-16: qty 2

## 2023-09-16 MED ORDER — SODIUM CHLORIDE 0.9 % IV SOLN
250.0000 mL | INTRAVENOUS | Status: AC
  Administered 2023-09-16: 250 mL via INTRAVENOUS

## 2023-09-16 MED ORDER — MEMANTINE HCL 10 MG PO TABS
10.0000 mg | ORAL_TABLET | Freq: Two times a day (BID) | ORAL | Status: DC
  Administered 2023-09-16: 10 mg via ORAL
  Filled 2023-09-16 (×2): qty 1

## 2023-09-16 MED ORDER — CHLORHEXIDINE GLUCONATE CLOTH 2 % EX PADS
6.0000 | MEDICATED_PAD | Freq: Every day | CUTANEOUS | Status: DC
  Administered 2023-09-17 – 2023-09-21 (×5): 6 via TOPICAL

## 2023-09-16 MED ORDER — CHLORHEXIDINE GLUCONATE 0.12 % MT SOLN
15.0000 mL | Freq: Once | OROMUCOSAL | Status: AC
  Administered 2023-09-16: 15 mL via OROMUCOSAL
  Filled 2023-09-16: qty 15

## 2023-09-16 MED ORDER — CHLORHEXIDINE GLUCONATE CLOTH 2 % EX PADS: 6.0000 | MEDICATED_PAD | Freq: Once | CUTANEOUS | Status: DC

## 2023-09-16 MED ORDER — ACETAMINOPHEN 650 MG RE SUPP: 325.0000 mg | RECTAL | Status: DC | PRN

## 2023-09-16 MED ORDER — HEPARIN SODIUM (PORCINE) 5000 UNIT/ML IJ SOLN
5000.0000 [IU] | Freq: Three times a day (TID) | INTRAMUSCULAR | Status: DC
  Administered 2023-09-17 – 2023-09-18 (×4): 5000 [IU] via SUBCUTANEOUS
  Filled 2023-09-16 (×4): qty 1

## 2023-09-16 MED ORDER — 0.9 % SODIUM CHLORIDE (POUR BTL) OPTIME
TOPICAL | Status: DC | PRN
  Administered 2023-09-16: 2000 mL

## 2023-09-16 MED ORDER — SENNOSIDES-DOCUSATE SODIUM 8.6-50 MG PO TABS: 1.0000 | ORAL_TABLET | Freq: Every evening | ORAL | Status: DC | PRN

## 2023-09-16 MED ORDER — SODIUM CHLORIDE 0.9 % IV SOLN: INTRAVENOUS | Status: DC

## 2023-09-16 MED ORDER — SODIUM CHLORIDE 0.9 % IV SOLN: INTRAVENOUS | Status: DC | PRN

## 2023-09-16 MED ORDER — HEPARIN 6000 UNIT IRRIGATION SOLUTION
Status: DC | PRN
  Administered 2023-09-16: 1

## 2023-09-16 MED ORDER — CALCIUM CHLORIDE 10 % IV SOLN
INTRAVENOUS | Status: DC | PRN
  Administered 2023-09-16 (×2): .5 g via INTRAVENOUS

## 2023-09-16 MED ORDER — ASPIRIN 81 MG PO TBEC
81.0000 mg | DELAYED_RELEASE_TABLET | Freq: Every day | ORAL | Status: DC
  Administered 2023-09-17 – 2023-09-21 (×5): 81 mg via ORAL
  Filled 2023-09-16 (×5): qty 1

## 2023-09-16 MED ORDER — SODIUM CHLORIDE 0.9 % IV SOLN: INTRAVENOUS | Status: AC

## 2023-09-16 MED ORDER — KETOROLAC TROMETHAMINE 15 MG/ML IJ SOLN: 15.0000 mg | Freq: Four times a day (QID) | INTRAMUSCULAR | Status: DC | PRN

## 2023-09-16 MED ORDER — CEFAZOLIN SODIUM 1 G IJ SOLR
INTRAMUSCULAR | Status: AC
  Filled 2023-09-16: qty 20

## 2023-09-16 MED ORDER — ALUM & MAG HYDROXIDE-SIMETH 200-200-20 MG/5ML PO SUSP: 15.0000 mL | ORAL | Status: DC | PRN

## 2023-09-16 MED ORDER — POTASSIUM CHLORIDE CRYS ER 20 MEQ PO TBCR: 20.0000 meq | EXTENDED_RELEASE_TABLET | Freq: Every day | ORAL | Status: DC | PRN

## 2023-09-16 MED ORDER — PANTOPRAZOLE SODIUM 40 MG PO TBEC
40.0000 mg | DELAYED_RELEASE_TABLET | Freq: Every day | ORAL | Status: DC
  Administered 2023-09-16 – 2023-09-21 (×6): 40 mg via ORAL
  Filled 2023-09-16 (×6): qty 1

## 2023-09-16 MED ORDER — DOCUSATE SODIUM 100 MG PO CAPS
100.0000 mg | ORAL_CAPSULE | Freq: Every day | ORAL | Status: DC
  Administered 2023-09-17 – 2023-09-21 (×2): 100 mg via ORAL
  Filled 2023-09-16 (×3): qty 1

## 2023-09-16 MED ORDER — LACTATED RINGERS IV SOLN: INTRAVENOUS | Status: DC | PRN

## 2023-09-16 MED ORDER — MAGNESIUM SULFATE 2 GM/50ML IV SOLN: 2.0000 g | Freq: Every day | INTRAVENOUS | Status: DC | PRN

## 2023-09-16 MED ORDER — METHOCARBAMOL 1000 MG/10ML IJ SOLN: 500.0000 mg | Freq: Four times a day (QID) | INTRAMUSCULAR | Status: DC | PRN

## 2023-09-16 MED ORDER — CEFAZOLIN SODIUM-DEXTROSE 2-4 GM/100ML-% IV SOLN
2.0000 g | Freq: Three times a day (TID) | INTRAVENOUS | Status: AC
  Administered 2023-09-16 – 2023-09-17 (×2): 2 g via INTRAVENOUS
  Filled 2023-09-16 (×3): qty 100

## 2023-09-16 MED ORDER — PHENYLEPHRINE HCL-NACL 20-0.9 MG/250ML-% IV SOLN
25.0000 ug/min | INTRAVENOUS | Status: DC
  Administered 2023-09-16: 25 ug/min via INTRAVENOUS
  Administered 2023-09-16: 35 ug/min via INTRAVENOUS
  Filled 2023-09-16 (×2): qty 250

## 2023-09-16 MED ORDER — INSULIN ASPART 100 UNIT/ML IJ SOLN
0.0000 [IU] | INTRAMUSCULAR | Status: DC | PRN
Start: 2023-09-16 — End: 2023-09-16

## 2023-09-16 MED ORDER — PROPOFOL 10 MG/ML IV BOLUS
INTRAVENOUS | Status: DC | PRN
  Administered 2023-09-16: 100 mg via INTRAVENOUS
  Administered 2023-09-16: 30 mg via INTRAVENOUS

## 2023-09-16 MED ORDER — SUGAMMADEX SODIUM 200 MG/2ML IV SOLN
INTRAVENOUS | Status: DC | PRN
  Administered 2023-09-16: 200 mg via INTRAVENOUS

## 2023-09-16 MED ORDER — GUAIFENESIN-DM 100-10 MG/5ML PO SYRP: 15.0000 mL | ORAL_SOLUTION | ORAL | Status: DC | PRN

## 2023-09-16 MED ORDER — OXYCODONE HCL 5 MG PO TABS
5.0000 mg | ORAL_TABLET | ORAL | Status: DC | PRN
  Administered 2023-09-19: 5 mg via ORAL
  Filled 2023-09-16: qty 1

## 2023-09-16 MED ORDER — SODIUM CHLORIDE 0.9% FLUSH
10.0000 mL | Freq: Two times a day (BID) | INTRAVENOUS | Status: DC
  Administered 2023-09-16 – 2023-09-19 (×6): 10 mL via INTRAVENOUS
  Administered 2023-09-20: 3 mL via INTRAVENOUS
  Administered 2023-09-20 – 2023-09-21 (×2): 10 mL via INTRAVENOUS

## 2023-09-16 MED ORDER — AMLODIPINE BESYLATE 5 MG PO TABS: 5.0000 mg | ORAL_TABLET | Freq: Every day | ORAL | Status: DC

## 2023-09-16 MED ORDER — LACTATED RINGERS IV SOLN: INTRAVENOUS | Status: DC

## 2023-09-16 MED ORDER — IODIXANOL 320 MG/ML IV SOLN
INTRAVENOUS | Status: DC | PRN
  Administered 2023-09-16: 195 mL via INTRA_ARTERIAL

## 2023-09-16 MED ORDER — ATORVASTATIN CALCIUM 40 MG PO TABS: 40.0000 mg | ORAL_TABLET | Freq: Every day | ORAL | Status: DC

## 2023-09-16 MED ORDER — ROCURONIUM BROMIDE 10 MG/ML (PF) SYRINGE
PREFILLED_SYRINGE | INTRAVENOUS | Status: AC
  Filled 2023-09-16: qty 10

## 2023-09-16 MED ORDER — LOSARTAN POTASSIUM 50 MG PO TABS
100.0000 mg | ORAL_TABLET | Freq: Every day | ORAL | Status: DC
  Administered 2023-09-16: 100 mg via ORAL
  Filled 2023-09-16: qty 2

## 2023-09-16 MED ORDER — FENTANYL CITRATE (PF) 250 MCG/5ML IJ SOLN
INTRAMUSCULAR | Status: AC
  Filled 2023-09-16: qty 5

## 2023-09-16 MED ORDER — DEXAMETHASONE SODIUM PHOSPHATE 10 MG/ML IJ SOLN
INTRAMUSCULAR | Status: AC
  Filled 2023-09-16: qty 1

## 2023-09-16 MED ORDER — HEPARIN SODIUM (PORCINE) 1000 UNIT/ML IJ SOLN
INTRAMUSCULAR | Status: DC | PRN
  Administered 2023-09-16 (×2): 3000 [IU] via INTRAVENOUS
  Administered 2023-09-16 (×3): 5000 [IU] via INTRAVENOUS

## 2023-09-16 MED ORDER — HYDROMORPHONE HCL 1 MG/ML IJ SOLN
0.5000 mg | INTRAMUSCULAR | Status: DC | PRN
  Administered 2023-09-16: 0.5 mg via INTRAVENOUS
  Filled 2023-09-16: qty 1

## 2023-09-16 MED ORDER — FENTANYL CITRATE (PF) 100 MCG/2ML IJ SOLN: 25.0000 ug | INTRAMUSCULAR | Status: DC | PRN

## 2023-09-16 MED ORDER — EPHEDRINE 5 MG/ML INJ
INTRAVENOUS | Status: AC
  Filled 2023-09-16: qty 5

## 2023-09-16 MED ORDER — INSULIN ASPART 100 UNIT/ML IJ SOLN: 0.0000 [IU] | Freq: Three times a day (TID) | INTRAMUSCULAR | Status: DC

## 2023-09-16 MED ORDER — HEPARIN SODIUM (PORCINE) 1000 UNIT/ML IJ SOLN
INTRAMUSCULAR | Status: AC
  Filled 2023-09-16: qty 1

## 2023-09-16 SURGICAL SUPPLY — 94 items
BAG COUNTER SPONGE SURGICOUNT (BAG) ×4 IMPLANT
BAG SNAP BAND KOVER 36X36 (MISCELLANEOUS) IMPLANT
BALLN CODA OCL 2-9.0-35-120-3 (BALLOONS) ×4
BALLOON COD OCL 2-9.0-35-120-3 (BALLOONS) IMPLANT
BLADE CLIPPER SURG (BLADE) ×4 IMPLANT
CANISTER SUCT 3000ML PPV (MISCELLANEOUS) ×4 IMPLANT
CATH 0.018 NAVICROSS ANG 135 (CATHETERS) IMPLANT
CATH BEACON 5.038 65CM KMP-01 (CATHETERS) ×4 IMPLANT
CATH OMNI FLUSH .035X70CM (CATHETERS) ×4 IMPLANT
CATH QUICKCROSS SUPP .035X90CM (MICROCATHETER) IMPLANT
CLIP 12FR CASSETTE HELI-FX (Vascular Products) ×4 IMPLANT
CLIP CASSETTE HELI-FX 12MM (Vascular Products) IMPLANT
CLIP LIGATING EXTRA MED SLVR (CLIP) ×4 IMPLANT
CLIP LIGATING EXTRA SM BLUE (MISCELLANEOUS) ×4 IMPLANT
COIL EMBL 14X103.2 LOOP (Embolic) IMPLANT
COIL NESTER 14X10 (Embolic) ×4 IMPLANT
COIL NESTER 14X12 (Embolic) IMPLANT
COVER BACK TABLE 60X90IN (DRAPES) IMPLANT
COVER DOME SNAP 22 D (MISCELLANEOUS) IMPLANT
COVER MAYO STAND STRL (DRAPES) IMPLANT
COVER PROBE CYLINDRICAL 5X96 (MISCELLANEOUS) IMPLANT
DERMABOND ADVANCED .7 DNX12 (GAUZE/BANDAGES/DRESSINGS) ×4 IMPLANT
DEVICE CLOSURE PERCLS PRGLD 6F (VASCULAR PRODUCTS) ×16 IMPLANT
DEVICE TORQUE SEADRAGON GRN (MISCELLANEOUS) IMPLANT
DILATOR VESSEL 38 20CM 18FR (INTRODUCER) IMPLANT
DRAPE CV SPLIT W-CLR ANES SCRN (DRAPES) IMPLANT
DRAPE IMP U-DRAPE 54X76 (DRAPES) IMPLANT
DRAPE INCISE IOBAN 66X45 STRL (DRAPES) IMPLANT
Dilator L#43875 IMPLANT
ELECT REM PT RETURN 9FT ADLT (ELECTROSURGICAL) ×8
ELECTRODE REM PT RTRN 9FT ADLT (ELECTROSURGICAL) ×8 IMPLANT
GAUZE 4X4 16PLY ~~LOC~~+RFID DBL (SPONGE) ×4 IMPLANT
GAUZE SPONGE 2X2 8PLY STRL LF (GAUZE/BANDAGES/DRESSINGS) ×8 IMPLANT
GLIDEWIRE ADV .035X180CM (WIRE) ×4 IMPLANT
GLIDEWIRE ADV .035X260CM (WIRE) IMPLANT
GLOVE BIO SURGEON STRL SZ7.5 (GLOVE) ×4 IMPLANT
GOWN STRL REUS W/ TWL LRG LVL3 (GOWN DISPOSABLE) ×8 IMPLANT
GOWN STRL REUS W/ TWL XL LVL3 (GOWN DISPOSABLE) ×8 IMPLANT
GOWN STRL REUS W/TWL LRG LVL3 (GOWN DISPOSABLE) ×8
GOWN STRL REUS W/TWL XL LVL3 (GOWN DISPOSABLE) ×8
GRAFT CV 30X10STRG TUBE (Vascular Products) IMPLANT
GRAFT HEMASHIELD 10MM (Vascular Products) ×4 IMPLANT
GRAFT HEMASHIELD 8MM (Vascular Products) ×4 IMPLANT
GRAFT LEG ILIAC ZSLE-24-74-ZT (Endovascular Graft) IMPLANT
GRAFT VASC STRG 30X8KNIT (Vascular Products) IMPLANT
GRAFT ZENITH RENU 32X113 (Endovascular Graft) IMPLANT
GUIDE HELI-FX 22MM (VASCULAR PRODUCTS) IMPLANT
GUIDEWIRE ANGLED .035X260CM (WIRE) IMPLANT
INSERT FOGARTY SM (MISCELLANEOUS) IMPLANT
KIT BASIN OR (CUSTOM PROCEDURE TRAY) ×4 IMPLANT
KIT ENCORE 26 ADVANTAGE (KITS) IMPLANT
KIT TURNOVER KIT B (KITS) ×4 IMPLANT
NS IRRIG 1000ML POUR BTL (IV SOLUTION) ×8 IMPLANT
PACK ENDOVASCULAR (PACKS) ×4 IMPLANT
PACK PERIPHERAL VASCULAR (CUSTOM PROCEDURE TRAY) ×4 IMPLANT
PAD ARMBOARD 7.5X6 YLW CONV (MISCELLANEOUS) ×8 IMPLANT
PENCIL SMOKE EVACUATOR (MISCELLANEOUS) IMPLANT
PERCLOSE PROGLIDE 6F (VASCULAR PRODUCTS)
POWDER SURGICEL 3.0 GRAM (HEMOSTASIS) IMPLANT
PROTECTION STATION PRESSURIZED (MISCELLANEOUS) ×4
SET DILATOR ENDOVASCULAR 20FR (SET/KITS/TRAYS/PACK) IMPLANT
SET MICROPUNCTURE 5F STIFF (MISCELLANEOUS) ×4 IMPLANT
SHEATH DRYSEAL FLEX 14FR 33CM (SHEATH) IMPLANT
SHEATH DRYSEAL FLEX 20FR 33CM (SHEATH) IMPLANT
SHEATH FLEX ANSEL ST 6FR 45CM (SHEATH) IMPLANT
SHEATH HIGHFLEX ANSEL 6FRX55 (SHEATH) IMPLANT
SHEATH INTROD PASSPORT 45 6.5F (SHEATH) IMPLANT
SHEATH PEEL AWAY 15.5X16F (SHEATH) IMPLANT
SHEATH PINNACLE 5F 10CM (SHEATH) IMPLANT
SHEATH PINNACLE 8F 10CM (SHEATH) ×4 IMPLANT
SPONGE T-LAP 18X18 ~~LOC~~+RFID (SPONGE) ×8 IMPLANT
STATION PROTECTION PRESSURIZED (MISCELLANEOUS) IMPLANT
STENT VIABAHN 8X59 8FR 135 (Permanent Stent) IMPLANT
STENT VIABAHN 9X59 7FR 135 (Permanent Stent) IMPLANT
STOPCOCK MORSE 400PSI 3WAY (MISCELLANEOUS) ×4 IMPLANT
SUT MNCRL AB 4-0 PS2 18 (SUTURE) ×8 IMPLANT
SUT PDS AB 1 CTX 36 (SUTURE) IMPLANT
SUT PROLENE 5 0 C 1 24 (SUTURE) ×8 IMPLANT
SUT PROLENE 6 0 BV (SUTURE) ×4 IMPLANT
SUT VIC AB 2-0 CT1 27 (SUTURE) ×12
SUT VIC AB 2-0 CT1 TAPERPNT 27 (SUTURE) ×8 IMPLANT
SUT VIC AB 3-0 SH 27 (SUTURE) ×12
SUT VIC AB 3-0 SH 27X BRD (SUTURE) ×8 IMPLANT
SYR 20ML LL LF (SYRINGE) ×4 IMPLANT
SYR 30ML LL (SYRINGE) IMPLANT
TOWEL GREEN STERILE (TOWEL DISPOSABLE) ×4 IMPLANT
TRAY FOLEY MTR SLVR 16FR STAT (SET/KITS/TRAYS/PACK) ×4 IMPLANT
TUBING INJECTOR 48 (MISCELLANEOUS) ×4 IMPLANT
VASCULAR TIE MINI RED 18IN STL (MISCELLANEOUS) IMPLANT
WATER STERILE IRR 1000ML POUR (IV SOLUTION) ×4 IMPLANT
WIRE AMPLATZ SS-J .035X180CM (WIRE) IMPLANT
WIRE BENTSON .035X145CM (WIRE) ×8 IMPLANT
WIRE LUNDERQUIST .035X180CM (WIRE) IMPLANT
WIRE STIFF LUNDERQUIST 260MM (WIRE) IMPLANT

## 2023-09-16 NOTE — Interval H&P Note (Signed)
History and Physical Interval Note:  09/16/2023 7:08 AM  Sunday Walter [name]  has presented today for surgery, with the diagnosis of Abdominal aortic aneurysm without rupture.  The various methods of treatment have been discussed with the patient and family. After consideration of risks, benefits and other options for treatment, the patient has consented to  Procedure(s): AORTOUNIILIAC STENT GRAFT (N/A) BYPASS GRAFT FEMORAL-FEMORAL ARTERY (Bilateral) as a surgical intervention.  The patient's history has been reviewed, patient examined, no change in status, stable for surgery.  I have reviewed the patient's chart and labs.  Questions were answered to the patient's satisfaction.     [name]

## 2023-09-16 NOTE — Anesthesia Procedure Notes (Signed)
Arterial Line Insertion Start/End11/15/2024 7:20 AM, 09/16/2023 7:25 AM Performed by: Atilano Median, DO, Waynard Edwards, CRNA, CRNA  Patient location: Pre-op. Preanesthetic checklist: patient identified, IV checked, site marked, risks and benefits discussed, surgical consent, monitors and equipment checked, pre-op evaluation, timeout performed and anesthesia consent Lidocaine 1% used for infiltration radial was placed Catheter size: 20 G Hand hygiene performed  and maximum sterile barriers used   Attempts: 1 Procedure performed without using ultrasound guided technique. Following insertion, dressing applied. Post procedure assessment: normal and unchanged  Patient tolerated the procedure well with no immediate complications.

## 2023-09-16 NOTE — Transfer of Care (Signed)
Immediate Anesthesia Transfer of Care Note  Patient: Sunday Corn  Procedure(s) Performed: [employer] [employer] STENT GRAFT (Groin) BYPASS GRAFT FEMORAL-FEMORAL ARTERY USING HEMSHIELD GOLD GRAFT (Bilateral: Groin) BILATERAL FEMORAL ENDARTERECTOMY (Bilateral: Groin) LEFT EXTERNAL ILIAC TO COMMON FEMORAL BYPASS USING HEMASHIELD GOLD GRAFT (Left: Groin) LEFT ILIAC ENDARTERECTOMY (Left: Groin)  Patient Location: PACU  Anesthesia Type:General  Level of Consciousness: awake, alert , and patient cooperative  Airway & Oxygen Therapy: Patient Spontanous Breathing  Post-op Assessment: Report given to RN and Post -op Vital signs reviewed and stable  Post vital signs: Reviewed and stable  Last Vitals:  Vitals Value Taken Time  BP    Temp    Pulse 80 09/16/23 1447  Resp 18 09/16/23 1447  SpO2 100 % 09/16/23 1447  Vitals shown include unfiled device data.  Last Pain:  Vitals:   09/16/23 0554  TempSrc:   PainSc: 0-No pain     Patient transported to PACU via stretcher with 6LFM, patient arousable and following commands, BP soft, responded well to phenylephrine IV x 1 dose. Follows commands, able to squeeze hands and wiggle toes on commands. VSS, Anesthesiologist aware    Complications: No notable events documented.

## 2023-09-16 NOTE — Op Note (Signed)
Patient name: Walter [name] MRN: [id number] DOB: [birth year] Sex: male  09/16/2023 Pre-operative Diagnosis: Abdominal aortic aneurysm Post-operative diagnosis:  Same Surgeon:  Apolinar Junes C. Randie Heinz, MD Assistants: Gillis Santa, MD; Clotilde Dieter, MD; Nathanial Rancher, [place] Procedure Performed: 1.  Coil right common iliac artery with 12 mm Nester coils x 8 and 10 mm Nester coil 2.  Stent of left external iliac artery with 9 x 59 and 8 x 59 mm VBX 3.  Left common iliac to left common femoral artery bypass with 10 mm dacron graft with associated left common iliac and left common femoral endarterectomy 4.  Repair of abdominal aortic aneurysm with Cook 32 x 113 mm AUI extended with 24 x 74 Zenith limb into left common iliac artery 5.  Endo anchor placement x 6 proximal aortic aneurysm graft 6.  Left to right femoral to femoral bypass with right common femoral endarterectomy  Indications: 77 year old male initially admitted to the hospital and found to have an enlarged abdominal aortic aneurysm measuring greater than 6 cm but he was severely deconditioned and also had concomitant pulmonary embolus and ultimately an IVC filter was placed.  He subsequently followed up last month and was noted to be significantly recovered with 30 pound weight gain and we discussed repair pending cardiac clearance and he has undergone stress test with moderate risk for general anesthesia.  He continues on aspirin therapy.  We have discussed the risk benefits alternatives with the patient and his wife and they demonstrate good understanding and agreed to proceed.  Multiple experience assistants were necessary given the complexity of this case as well as the emergent nature of avulsing the left common femoral artery from left external iliac artery as well as passing wires catheters and stents and performing bilateral common femoral endarterectomy with femorofemoral bypass.  Findings: The aortic bifurcation was very steep and  required steerable sheath to go up and over where we had planned to place a plug we ultimately placed 9 coils.  The bilateral common femoral arteries were heavily calcified.  On the left side we attempted to retrograde treat the aorta but we could not get larger sheath the past.  Ultimately we stented the left external iliac artery with 9 x 59 and 8 x 59 mm VBX's but unfortunately the common femoral artery was ultimately avulsed from the external iliac artery stent with trying to pass a large sheath.  At this time the balloon was inflated and a retroperitoneal incision was created to expose the left common iliac artery and we were able to clamp this as well as the hypogastric artery and remove the left external iliac artery back to this level and after endarterectomy we sewed a 10 mm dacron graft to the common iliac artery and used this as a conduit and were ultimately able to deliver the stent graft and performed a AUI repair of the aneurysm.  Unfortunately there was a type I endoleak and despite ballooning we had to repair with Endo anchors which ultimately sealed the aneurysm sac although there was a late type II filling of the sac.  Extensive endarterectomy of the left common femoral artery was performed and the 10 mm graft was sewn into into the common femoral artery there and an 8 mm graft was anastomosed to the 10 mm graft and to the distal common femoral artery with the toe on the profunda on the right after extensive endarterectomy.  At completion there were strong posterior tibial signals bilaterally.   Procedure:  The patient was identified in the holding area and taken to the operating room where he was placed supine on the operative table and general anesthesia was induced.  He was sterilely prepped and draped in the bilateral groins in usual fashion, antibiotics were administered and a timeout was called.  We began with bilateral vertical incisions in the groins dissecting down to the heavily  calcified common femoral arteries and in both incisions we dissected out the profunda and the SFA.  On the left side we dissected under the inguinal ligament to the crossing vein and this was divided between ties.  At this time we turned our attention to the left groin only.  This was cannulated with an micropuncture needle followed by wire and a sheath and we then placed a Bentson wire followed by a 5 French sheath and then in the aorta we used a Kumpe catheter exchanged for a stiff initially Amplatz wire and then placed dilators and we were able to get a 14 French sheath which was a Gore dry seal into the aorta.  We attempted to cross the bifurcation with Omni catheter and wire we were not able to get any catheters to track.  Ultimately we used a 6.5 cm steerable sheath and we were able to get a Glidewire up and over the bifurcation followed by a Kumpe catheter and we delivered 9 coils as I did not believe that a Amplatzer plug would track.  Completion demonstrated minimal filling of the common iliac artery on the right.  Satisfied with this we exchanged for a Lunderquist wire into the aorta and we again attempted more dilating of the external iliac arteries.  The patient was fully heparinized at this time.  Ultimately we elected to place the 14 French sheath back and then we stented proximally in the external iliac artery with a 9 x 59 mm VBX and then ultimately stented all the way down to the common femoral head with an 8 x 59 mm VBX.  We again attempted dilating the external iliac arteries and ultimately distally avulsed.  We did have some bleeding but pressure was held we had a wire up and we placed the balloon from the 8 mm VBX up to the common iliac artery inflated this.  With this a vertical incision was created just left of the umbilicus we dissected down to the anterior fascia and this was divided.  The rectus muscle was mobilized medially and the retroperitoneum was then delivered cephalad and medially.   We were able to retract this identify the common iliac artery and clamped this as well as the external and hypogastric arteries on the left and then we allowed our balloon down and removed our wire and sheath.  We then performed extensive endarterectomy and ultimately excised the entire external iliac artery and placed clips on a few of the branches that were still attached.  A 10 mm dacron graft was then brought into the field and sewn end-to-side to the common iliac artery with 5-0 Prolene suture.  Upon completion we then had good backbleeding from the hypogastric and antegrade bleeding from the common iliac artery.  This was then tunneled through anatomically and a vessel loop was placed around it.  We were then able to place the Glidewire advantage into the aorta and then performed aortogram over an Omni catheter.  We exchanged for a Lunderquist wire and then delivered our AUI device which was 32 x 113 mm and this was deployed approximately 1 cm  from the left renal given the reverse conical shape of the neck.  We then performed completion angiography which demonstrated our left hypogastric artery and we extended down with a 24 x 74 mm limb landing just short of the hypogastric artery which was noted to be patent.  We then exchanged for a 20 [place] Gore dry seal sheath and the devices were ironed out using [place] balloon.  Completion demonstrated a type I endoleak which was then reironed out with a [place] balloon with completion angiography again demonstrated a type I endoleak.  With this we then elected to Endo anchor.  We placed 6 Endo anchors to placed with the image intensifier at an AP projection into each placed at 30 degrees RAO and 30 degrees LAO.  Completion demonstrated no residual endoleak.  Satisfied with this we removed all the catheters and wires and sheaths.  We proceeded with the bypass portion of the case.  In the left groin we did have to extend the incision further down to identify the profunda  slightly better.  We then transected the common femoral artery down near the profunda and performed extensive endarterectomy until we had a good endpoint on the SFA and there was strong backbleeding from the profunda.  The 10 mm dacryon graft was then straightened and trimmed to size and sewn into and to the common femoral artery with 5-0 Prolene suture.  Prior completion without flushing all directions.  We then tunneled an 8 mm dacryon graft just over the pubis.  In the right groin the outflow and inflow was clamped and we created arteriotomy from the profunda back onto the common femoral artery where there was significant calcification and this was all endarterectomized and there was good backbleeding from both an SFA and profunda.  The 8 mm dacryon was then spatulated and sewn into side with 5-0 Prolene suture.  After this was done we allowed flushing through this.  We then measured the graft to the 10 mm dacryon graft and left groin trimmed this to size.  The background was then clamped proximally and distally and we opened the 10 mm side and created approximately 8 mm punch using Potts scissors.  The 8 mm graft was then trimmed to size and sewn into side to the 10 mm graft with 5-0 Prolene suture.  Prior completion without flushing all directions significantly de-aired and flushed with heparinized saline.  On completion there were very strong signals in the SFAs proximally and at the posterior tibial arteries at the ankles proximally.  Satisfied with this we administered 25 mg of protamine.  We meticulously irrigated the groin wounds and hemostasis was obtained and we closed in layers of Vicryl Monocryl.  An x-ray was obtained of the abdomen given the lack of a complete count we do not see any foreign bodies.  We then inspected the retroperitoneum this was noted to be free of bleeding this was irrigated we closed the fascia with 0 PDS and the skin was closed with 4-0 Monocryl.  The patient was then awakened  from anesthesia he was hemodynamically stable and transferred to recovery in stable but guarded condition.  EBL: 1.5 L  Contrast: 195cc  Transfusion: 3 units prbc's   Maeson Lourenco C. Randie Heinz, MD [facility] Office: [phone] Pager: [phone]

## 2023-09-16 NOTE — Progress Notes (Signed)
Patient complaining of nausea and 9/10 pain in lower abdomen. Bladder scan done. Patient found to not be retaining. 0.5 of dilaudid given and 40mg  of zofran. MD notified. MD verbally ordered 500mg  Robaxin.

## 2023-09-16 NOTE — Anesthesia Procedure Notes (Signed)
Procedure Name: Intubation Date/Time: 09/16/2023 7:47 AM  Performed by: Allyn Kenner, CRNAPre-anesthesia Checklist: Patient identified, Emergency Drugs available, Suction available and Patient being monitored Patient Re-evaluated:Patient Re-evaluated prior to induction Oxygen Delivery Method: Circle System Utilized Preoxygenation: Pre-oxygenation with 100% oxygen Induction Type: IV induction Ventilation: Mask ventilation without difficulty Laryngoscope Size: Mac and 4 Grade View: Grade I Tube type: Oral Tube size: 7.5 mm Number of attempts: 1 Airway Equipment and Method: Stylet and Oral airway Placement Confirmation: ETT inserted through vocal cords under direct vision, positive ETCO2 and breath sounds checked- equal and bilateral Tube secured with: Tape Dental Injury: Teeth and Oropharynx as per pre-operative assessment  Comments: Intubated with MAC 4 blade, 7.5 ETT, visualized passing through VC, +ETT, bilateral breath sounds, secured 22cm at the teeth, no e/o aspiration or regurgitation, no changes to lips or teeth

## 2023-09-17 DIAGNOSIS — Z8679 Personal history of other diseases of the circulatory system: Secondary | ICD-10-CM | POA: Diagnosis not present

## 2023-09-17 DIAGNOSIS — Z9889 Other specified postprocedural states: Secondary | ICD-10-CM

## 2023-09-17 DIAGNOSIS — Z95828 Presence of other vascular implants and grafts: Secondary | ICD-10-CM

## 2023-09-17 LAB — CBC
HCT: 29.2 % — ABNORMAL LOW (ref 39.0–52.0)
Hemoglobin: 9.6 g/dL — ABNORMAL LOW (ref 13.0–17.0)
MCH: 29.7 pg (ref 26.0–34.0)
MCHC: 32.9 g/dL (ref 30.0–36.0)
MCV: 90.4 fL (ref 80.0–100.0)
Platelets: 149 10*3/uL — ABNORMAL LOW (ref 150–400)
RBC: 3.23 MIL/uL — ABNORMAL LOW (ref 4.22–5.81)
RDW: 14.4 % (ref 11.5–15.5)
WBC: 16.7 10*3/uL — ABNORMAL HIGH (ref 4.0–10.5)
nRBC: 0 % (ref 0.0–0.2)

## 2023-09-17 LAB — POCT I-STAT 7, (LYTES, BLD GAS, ICA,H+H)
Acid-base deficit: 11 mmol/L — ABNORMAL HIGH (ref 0.0–2.0)
Bicarbonate: 14.8 mmol/L — ABNORMAL LOW (ref 20.0–28.0)
Calcium, Ion: 1.05 mmol/L — ABNORMAL LOW (ref 1.15–1.40)
HCT: 27 % — ABNORMAL LOW (ref 39.0–52.0)
Hemoglobin: 9.2 g/dL — ABNORMAL LOW (ref 13.0–17.0)
O2 Saturation: 97 %
Patient temperature: 97.3
Potassium: 5.4 mmol/L — ABNORMAL HIGH (ref 3.5–5.1)
Sodium: 140 mmol/L (ref 135–145)
TCO2: 16 mmol/L — ABNORMAL LOW (ref 22–32)
pCO2 arterial: 28.8 mm[Hg] — ABNORMAL LOW (ref 32–48)
pH, Arterial: 7.313 — ABNORMAL LOW (ref 7.35–7.45)
pO2, Arterial: 99 mm[Hg] (ref 83–108)

## 2023-09-17 LAB — LIPID PANEL
Cholesterol: 75 mg/dL (ref 0–200)
HDL: 26 mg/dL — ABNORMAL LOW (ref >40)
LDL Cholesterol: 31 mg/dL (ref 0–99)
Total CHOL/HDL Ratio: 2.9 {ratio}
Triglycerides: 92 mg/dL (ref <150)
VLDL: 18 mg/dL (ref 0–40)

## 2023-09-17 LAB — GLUCOSE, CAPILLARY
Glucose-Capillary: 209 mg/dL — ABNORMAL HIGH (ref 70–99)
Glucose-Capillary: 170 mg/dL — ABNORMAL HIGH (ref 70–99)
Glucose-Capillary: 117 mg/dL — ABNORMAL HIGH (ref 70–99)
Glucose-Capillary: 139 mg/dL — ABNORMAL HIGH (ref 70–99)
Glucose-Capillary: 130 mg/dL — ABNORMAL HIGH (ref 70–99)

## 2023-09-17 LAB — BASIC METABOLIC PANEL
Anion gap: 11 (ref 5–15)
BUN: 32 mg/dL — ABNORMAL HIGH (ref 8–23)
CO2: 15 mmol/L — ABNORMAL LOW (ref 22–32)
Calcium: 7.7 mg/dL — ABNORMAL LOW (ref 8.9–10.3)
Chloride: 113 mmol/L — ABNORMAL HIGH (ref 98–111)
Creatinine, Ser: 2.1 mg/dL — ABNORMAL HIGH (ref 0.61–1.24)
GFR, Estimated: 32 mL/min — ABNORMAL LOW (ref >60)
Glucose, Bld: 244 mg/dL — ABNORMAL HIGH (ref 70–99)
Potassium: 5.6 mmol/L — ABNORMAL HIGH (ref 3.5–5.1)
Sodium: 139 mmol/L (ref 135–145)

## 2023-09-17 LAB — LACTIC ACID, PLASMA: Lactic Acid, Venous: 3.3 mmol/L (ref 0.5–1.9)

## 2023-09-17 MED ORDER — INSULIN ASPART 100 UNIT/ML IJ SOLN
0.0000 [IU] | INTRAMUSCULAR | Status: DC
  Administered 2023-09-17: 2 [IU] via SUBCUTANEOUS
  Administered 2023-09-17: 3 [IU] via SUBCUTANEOUS

## 2023-09-17 MED ORDER — ORAL CARE MOUTH RINSE: 15.0000 mL | OROMUCOSAL | Status: DC | PRN

## 2023-09-17 MED ORDER — SODIUM ZIRCONIUM CYCLOSILICATE 10 G PO PACK
10.0000 g | PACK | Freq: Once | ORAL | Status: AC
  Administered 2023-09-17: 10 g via ORAL
  Filled 2023-09-17: qty 1

## 2023-09-17 MED ORDER — INSULIN ASPART 100 UNIT/ML IJ SOLN
0.0000 [IU] | Freq: Three times a day (TID) | INTRAMUSCULAR | Status: DC
  Administered 2023-09-17 – 2023-09-18 (×3): 1 [IU] via SUBCUTANEOUS
  Administered 2023-09-19: 2 [IU] via SUBCUTANEOUS
  Administered 2023-09-19: 1 [IU] via SUBCUTANEOUS
  Administered 2023-09-19: 3 [IU] via SUBCUTANEOUS
  Administered 2023-09-20 (×4): 2 [IU] via SUBCUTANEOUS
  Administered 2023-09-21: 5 [IU] via SUBCUTANEOUS
  Administered 2023-09-21: 2 [IU] via SUBCUTANEOUS

## 2023-09-17 MED ORDER — LACTATED RINGERS IV BOLUS
500.0000 mL | Freq: Once | INTRAVENOUS | Status: AC
  Administered 2023-09-17: 500 mL via INTRAVENOUS

## 2023-09-17 MED ORDER — LACTATED RINGERS IV SOLN: INTRAVENOUS | Status: AC

## 2023-09-17 NOTE — Evaluation (Signed)
Physical Therapy Evaluation Patient Details Name: Walter [name] MRN: [id number] DOB: 1946/05/20 Today's Date: 09/17/2023  History of Present Illness  77 y.o. male admitted 11/15 for elective repair of abdominal aortic aneurysm. Complicated surgery with blood loss, admitted to the ICU on peripheral pressors, AKI and acidosis. PMHx: abdominal aortic aneurysm, L1 compression fx, coronary artery disease, peripheral artery disease, hyperlipidemia, Parkinson's with dementia, PE/DVT status post IVC filter, Cancer, Cataract, Diabetes mellitus, Diverticulosis, Esophageal stricture, GERD, hiatal hernia, Hypertension, and Peripheral vascular disease.  Clinical Impression  Patient is s/p above surgery presenting with functional limitations due to the deficits listed below (see PT Problem List). Previously Mod I with gait using rollator sparingly, drives, but wife assists pt into tub for safety. Evaluation somewhat limited due to drop in BP. He required up to mod assist for bed mobility and sit to stand transfer. Hypotensive on EOB - denied dizziness but worsened trunk control with notable weakness until he lied back down. BP pre activity 116/67, sitting EOB 57/59, after returning to supine 142/93. Patient will benefit from intensive inpatient follow up therapy, >3 hours/day. Will progress acutely as tolerated. Encourage OOB often with nursing staff if BP stable. Patient will benefit from acute skilled PT to increase their independence and safety with mobility to facilitate discharge.         If plan is discharge home, recommend the following: A lot of help with walking and/or transfers;A lot of help with bathing/dressing/bathroom;Assistance with cooking/housework;Direct supervision/assist for medications management;Direct supervision/assist for financial management;Assist for transportation;Help with stairs or ramp for entrance   Can travel by private vehicle        Equipment Recommendations None  recommended by PT (TBD)  Recommendations for Other Services  Rehab consult    Functional Status Assessment Patient has had a recent decline in their functional status and demonstrates the ability to make significant improvements in function in a reasonable and predictable amount of time.     Precautions / Restrictions Precautions Precautions: Fall Precaution Comments: Monitor BP, A-line Lt radial Restrictions Weight Bearing Restrictions: No      Mobility  Bed Mobility Overal bed mobility: Needs Assistance Bed Mobility: Rolling, Sidelying to Sit, Sit to Sidelying Rolling: Min assist, Used rails Sidelying to sit: Mod assist, Used rails, HOB elevated     Sit to sidelying: Min assist General bed mobility comments: Min assist to roll with cues for technique and hand over hand guidance to reach for rail and sequence; Mod assist for trunk support to rise, leans Rt on EOB. Min assist to guide to sidelying and light support for LEs back into bed. Cues throughout.    Transfers Overall transfer level: Needs assistance Equipment used: 1 person hand held assist Transfers: Sit to/from Stand Sit to Stand: Mod assist           General transfer comment: Mod assist for boost and balance, standing face to face with pt to stand light knee block for safety, slow and effortful rise. Does not fully raise head, difficulty standing upright, possibly chronic with hx of L1 compression fx. Tolerated approx 15 seconds. Deferred transfer to chair due to low BP    Ambulation/Gait               General Gait Details: Deferred to low BP with mobility.  Stairs            Wheelchair Mobility     Tilt Bed    Modified Rankin (Stroke Patients Only)  Balance Overall balance assessment: Needs assistance Sitting-balance support: Feet supported, Single extremity supported Sitting balance-Leahy Scale: Poor Sitting balance - Comments: Min assist - CGA EOB Postural control: Right  lateral lean Standing balance support: Bilateral upper extremity supported Standing balance-Leahy Scale: Poor                               Pertinent Vitals/Pain Pain Assessment Pain Assessment: Faces Faces Pain Scale: Hurts little more Pain Location: lower abdomen Pain Descriptors / Indicators: Sore Pain Intervention(s): Monitored during session, Repositioned    Home Living Family/patient expects to be discharged to:: Private residence Living Arrangements: Spouse/significant other Available Help at Discharge: Family;Available PRN/intermittently Type of Home: House Home Access: Stairs to enter Entrance Stairs-Rails: None Entrance Stairs-Number of Steps: 1   Home Layout: Two level;Able to live on main level with bedroom/bathroom Home Equipment: Rollator (4 wheels);BSC/3in1;Toilet riser;Rolling Walker (2 wheels);Grab bars - toilet;Grab bars - tub/shower;Shower seat      Prior Function Prior Level of Function : Independent/Modified Independent             Mobility Comments: Uses rollator in the morning when he first wakes up, or if he has to walk to the bathroom at night ADLs Comments: Wife assists getting into tub. Able to dress himself, driving.     Extremity/Trunk Assessment   Upper Extremity Assessment Upper Extremity Assessment: Defer to OT evaluation    Lower Extremity Assessment Lower Extremity Assessment: Generalized weakness       Communication   Communication Communication: No apparent difficulties Cueing Techniques: Verbal cues;Tactile cues  Cognition Arousal: Alert Behavior During Therapy: Flat affect Overall Cognitive Status: History of cognitive impairments - at baseline                                 General Comments: Delayed with cues for motor tasks        General Comments General comments (skin integrity, edema, etc.): Pre-activity in bed: HR 95, SpO2 98% on 4L supplemental O2, BP 116/67. Sitting EOB BP 87/59 -  denies dizziness but notably weak; returned to supine BP 142/93 HR 90.    Exercises General Exercises - Lower Extremity Ankle Circles/Pumps: AROM, Both, 15 reps, Supine Quad Sets: Strengthening, Both, 10 reps, Supine Gluteal Sets: Strengthening, Both, 10 reps, Supine Hip ABduction/ADduction: Strengthening, Both, 10 reps, Supine   Assessment/Plan    PT Assessment Patient needs continued PT services  PT Problem List Decreased strength;Decreased range of motion;Decreased activity tolerance;Decreased balance;Decreased mobility;Decreased coordination;Decreased cognition;Decreased knowledge of use of DME;Cardiopulmonary status limiting activity;Pain       PT Treatment Interventions DME instruction;Gait training;Stair training;Functional mobility training;Therapeutic activities;Therapeutic exercise;Balance training;Neuromuscular re-education;Cognitive remediation;Patient/family education;Modalities    PT Goals (Current goals can be found in the Care Plan section)  Acute Rehab PT Goals Patient Stated Goal: Get well PT Goal Formulation: With patient/family Time For Goal Achievement: 10/01/23 Potential to Achieve Goals: Good    Frequency Min 1X/week     Co-evaluation               AM-PAC PT "6 Clicks" Mobility  Outcome Measure Help needed turning from your back to your side while in a flat bed without using bedrails?: A Little Help needed moving from lying on your back to sitting on the side of a flat bed without using bedrails?: A Lot Help needed moving to and from a bed  to a chair (including a wheelchair)?: A Lot Help needed standing up from a chair using your arms (e.g., wheelchair or bedside chair)?: A Lot Help needed to walk in hospital room?: Total Help needed climbing 3-5 steps with a railing? : Total 6 Click Score: 11    End of Session Equipment Utilized During Treatment: Gait belt;Oxygen Activity Tolerance: Patient limited by fatigue;Treatment limited secondary to  medical complications (Comment) (Drop in BP) Patient left: in bed;with call bell/phone within reach;with bed alarm set;with nursing/sitter in room;with SCD's reapplied Nurse Communication: Mobility status (BP drop sitting and standing) PT Visit Diagnosis: Unsteadiness on feet (R26.81);Muscle weakness (generalized) (M62.81);Difficulty in walking, not elsewhere classified (R26.2)    Time: 8657-8469 PT Time Calculation (min) (ACUTE ONLY): 38 min   Charges:   PT Evaluation $PT Eval Moderate Complexity: 1 Mod PT Treatments $Therapeutic Exercise: 8-22 mins $Therapeutic Activity: 8-22 mins PT General Charges $$ ACUTE PT VISIT: 1 Visit         Kathlyn [place], PT, DPT [facility] Services Physical Therapist Office: [phone] Website: [email or web address]   Berton Mount 09/17/2023, 1:57 PM

## 2023-09-17 NOTE — Progress Notes (Signed)
Inpatient Rehab Admissions Coordinator Note:   Per therapy patient was screened for CIR candidacy by [facility] Luvenia Starch, CCC-SLP. At this time, pt appears to be a potential candidate for CIR. I will place an order for rehab consult for full assessment, per our protocol.  Please contact me any with questions.Wolfgang Phoenix, MS, CCC-SLP Admissions Coordinator [phone] 09/17/23 5:48 PM

## 2023-09-17 NOTE — Consult Note (Addendum)
NAME:  Walter [name], MRN:  [id number], DOB:  1945-11-26, LOS: 1 ADMISSION DATE:  09/16/2023, CONSULTATION DATE:  09/17/2023 REFERRING MD: [name], MD, CHIEF COMPLAINT: Shock post abdominal aortic aneurysm repair,  History of Present Illness:   77 year old with history of abdominal aortic aneurysm, coronary artery disease, peripheral artery disease, hyperlipidemia, Parkinson's with dementia, PE/DVT status post IVC filter came in for elective repair by vascular surgery.  He had a complicated surgery with blood loss.  Now admitted to the ICU on peripheral pressors, AKI and acidosis.  He had recent hospitalization in May 2024 for compression fracture, and found to have incidental pulmonary embolism and right lower lobe subsegment.  He was started on anticoagulation but then developed a GI bleed.  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  He did not tolerate resumption of Eliquis and eventually IVC filter placed on 03/09/2023.  Pertinent  Medical History    has a past medical history of Allergy, Cancer (HCC), Cataract, Coronary artery disease, Diabetes mellitus, Diverticulosis, Esophageal stricture, GERD (gastroesophageal reflux disease), History of hiatal hernia, Hyperlipidemia, Hypertension, and Peripheral vascular disease (HCC).   Significant Hospital Events: Including procedures, antibiotic start and stop dates in addition to other pertinent events   11/15- Repair of abdominal aortic aneurysm   Interim History / Subjective:   Awake, complains of mild epigastric pain  Objective   Blood pressure (!) 97/58, pulse (!) 101, temperature 98.1 F (36.7 C), temperature source Axillary, resp. rate (!) 25, height 5\' 7"  (1.702 m), weight 71.8 kg, SpO2 97%.        Intake/Output Summary (Last 24 hours) at 09/17/2023 0733 Last data filed at 09/17/2023 0600 Gross per 24 hour  Intake 5834.43 ml  Output 1825 ml  Net 4009.43 ml   Filed Weights   09/16/23 0546 09/17/23 0339   Weight: 64.4 kg 71.8 kg    Examination: Blood pressure (!) 97/58, pulse (!) 101, temperature 98.1 F (36.7 C), temperature source Axillary, resp. rate (!) 25, height 5\' 7"  (1.702 m), weight 71.8 kg, SpO2 97%. Gen:      No acute distress, frail, chronically ill-appearing HEENT:  EOMI, sclera anicteric Neck:     No masses; no thyromegaly Lungs:    Clear to auscultation bilaterally; normal respiratory effort CV:         Regular rate and rhythm; no murmurs Abd:   Lower abdominal dressing.  Mild tenderness in the epigastrium.  Positive bowel sounds. Ext:    No edema; adequate peripheral perfusion Skin:      Warm and dry; no rash Neuro: alert and oriented x 3  Labs/imaging reviewed Significant for potassium 5.6, glucose 244, BUN/creatinine 32/2.10 WBC 16.7, hemoglobin 9.6, platelets 149   Resolved Hospital Problem list     Assessment & Plan:  Postoperative shock after repair of abdominal aortic aneurysm. Hemorrhagic shock Follow CBC and transfuse for hemoglobin less than 7 Give bolus of IV fluid and wean off Neo-Synephrine Check ABG, lactic acid  AKI, hyperkalemia Monitor urine output and creatinine.  IV fluids as above. Lokelma x 1  History of PE, DVT Cannot tolerate anticoagulation due to GI bleed.  Has IVC filter in place  Hypertension Hold amlodipine, losartan  Prediabetes SSI coverage  Hyperlipidemia Hold Lipitor  GERD PPI on hold  Dementia. Namenda when able to take PO  Severe protein calorie malnutrition Will get nutrition consult when cleared for p.o. diet.  Goals of care Discussed with wife.  She confirms that he is DNR.  Best Practice (right click and "Reselect all SmartList Selections" daily)   Diet/type: NPO DVT prophylaxis: SCD GI prophylaxis: PPI Lines: N/A Foley:  N/A Code Status:  DNR Last date of multidisciplinary goals of care discussion []   Critical care time:    The patient is critically ill with multiple organ system failure and  requires high complexity decision making for assessment and support, frequent evaluation and titration of therapies, advanced monitoring, review of radiographic studies and interpretation of complex data.   Critical Care Time devoted to patient care services, exclusive of separately billable procedures, described in this note is 35 minutes.   Chilton Greathouse MD [facility] See Amion for pager  If no response to pager , please call [phone] until 7pm After 7:00 pm call Elink  [phone] 09/17/2023, 7:51 AM

## 2023-09-17 NOTE — Progress Notes (Signed)
  Progress Note    09/17/2023 1:37 PM 1 Day Post-Op  Subjective: He was feeling okay with mild abdominal pain on exam this morning  Vitals:   09/17/23 1300 09/17/23 1315  BP: (!) 97/47   Pulse: 95 91  Resp: (!) 24 19  Temp:    SpO2: 98% 98%    Physical Exam: Awake alert and oriented Nonlabored respirations Mild abdominal pain at incision site Bilateral common femoral pulses are palpable Both feet are warm and well-perfused and sensorimotor intact   CBC    Component Value Date/Time   WBC 16.7 (H) 09/17/2023 0341   RBC 3.23 (L) 09/17/2023 0341   HGB 9.2 (L) 09/17/2023 0900   HGB 11.3 (L) 04/18/2023 0816   HCT 27.0 (L) 09/17/2023 0900   HCT 36.1 (L) 04/18/2023 0816   PLT 149 (L) 09/17/2023 0341   PLT 227 04/18/2023 0816   MCV 90.4 09/17/2023 0341   MCV 86 04/18/2023 0816   MCH 29.7 09/17/2023 0341   MCHC 32.9 09/17/2023 0341   RDW 14.4 09/17/2023 0341   RDW 18.1 (H) 04/18/2023 0816   LYMPHSABS 1.5 05/30/2023 1420   LYMPHSABS 1.2 04/18/2023 0816   MONOABS 0.4 05/30/2023 1420   EOSABS 0.2 05/30/2023 1420   EOSABS 0.2 04/18/2023 0816   BASOSABS 0.0 05/30/2023 1420   BASOSABS 0.0 04/18/2023 0816    BMET    Component Value Date/Time   NA 140 09/17/2023 0900   K 5.4 (H) 09/17/2023 0900   CL 113 (H) 09/17/2023 0341   CO2 15 (L) 09/17/2023 0341   GLUCOSE 244 (H) 09/17/2023 0341   GLUCOSE 127 (H) 08/17/2006 1059   BUN 32 (H) 09/17/2023 0341   CREATININE 2.10 (H) 09/17/2023 0341   CALCIUM 7.7 (L) 09/17/2023 0341   GFRNONAA 32 (L) 09/17/2023 0341   GFRAA >60 01/07/2019 0751    INR    Component Value Date/Time   INR 1.4 (H) 09/16/2023 1620     Intake/Output Summary (Last 24 hours) at 09/17/2023 1337 Last data filed at 09/17/2023 0600 Gross per 24 hour  Intake 1334.43 ml  Output 115 ml  Net 1219.43 ml     Assessment/plan:  77 y.o. male is s/p AUI requiring dacryon bypass from left common iliac artery to left common femoral with significant  endarterectomy of the left common femoral and left to right femorofemoral bypass with endarterectomy of the right common femoral artery.  He did have significant blood loss and has experienced acute kidney injury and required overnight vasopressors.  PCCM consulted this morning and their input and evaluation much appreciated.  He is okay for out of bed and diet as tolerated from a surgical standpoint.    Cletus Mehlhoff C. Randie Heinz, MD [facility] Office: [phone] Pager: [phone]  09/17/2023 1:37 PM

## 2023-09-18 DIAGNOSIS — Z8679 Personal history of other diseases of the circulatory system: Secondary | ICD-10-CM | POA: Diagnosis not present

## 2023-09-18 DIAGNOSIS — Z9889 Other specified postprocedural states: Secondary | ICD-10-CM | POA: Diagnosis not present

## 2023-09-18 LAB — CBC
HCT: 19.9 % — ABNORMAL LOW (ref 39.0–52.0)
HCT: 27 % — ABNORMAL LOW (ref 39.0–52.0)
Hemoglobin: 6.6 g/dL — CL (ref 13.0–17.0)
Hemoglobin: 9.2 g/dL — ABNORMAL LOW (ref 13.0–17.0)
MCH: 30.1 pg (ref 26.0–34.0)
MCH: 30.5 pg (ref 26.0–34.0)
MCHC: 33.2 g/dL (ref 30.0–36.0)
MCHC: 34.1 g/dL (ref 30.0–36.0)
MCV: 90.9 fL (ref 80.0–100.0)
MCV: 89.4 fL (ref 80.0–100.0)
Platelets: 82 10*3/uL — ABNORMAL LOW (ref 150–400)
Platelets: 73 10*3/uL — ABNORMAL LOW (ref 150–400)
RBC: 2.19 MIL/uL — ABNORMAL LOW (ref 4.22–5.81)
RBC: 3.02 MIL/uL — ABNORMAL LOW (ref 4.22–5.81)
RDW: 14.6 % (ref 11.5–15.5)
RDW: 14.4 % (ref 11.5–15.5)
WBC: 10.8 10*3/uL — ABNORMAL HIGH (ref 4.0–10.5)
WBC: 11.6 10*3/uL — ABNORMAL HIGH (ref 4.0–10.5)
nRBC: 0 % (ref 0.0–0.2)
nRBC: 0 % (ref 0.0–0.2)

## 2023-09-18 LAB — COMPREHENSIVE METABOLIC PANEL
ALT: 8 U/L (ref 0–44)
AST: 30 U/L (ref 15–41)
Albumin: 2.1 g/dL — ABNORMAL LOW (ref 3.5–5.0)
Alkaline Phosphatase: 40 U/L (ref 38–126)
Anion gap: 9 (ref 5–15)
BUN: 47 mg/dL — ABNORMAL HIGH (ref 8–23)
CO2: 17 mmol/L — ABNORMAL LOW (ref 22–32)
Calcium: 7 mg/dL — ABNORMAL LOW (ref 8.9–10.3)
Chloride: 113 mmol/L — ABNORMAL HIGH (ref 98–111)
Creatinine, Ser: 2.81 mg/dL — ABNORMAL HIGH (ref 0.61–1.24)
GFR, Estimated: 22 mL/min — ABNORMAL LOW (ref >60)
Glucose, Bld: 126 mg/dL — ABNORMAL HIGH (ref 70–99)
Potassium: 3.9 mmol/L (ref 3.5–5.1)
Sodium: 139 mmol/L (ref 135–145)
Total Bilirubin: 0.2 mg/dL (ref <1.2)
Total Protein: 4.1 g/dL — ABNORMAL LOW (ref 6.5–8.1)

## 2023-09-18 LAB — TYPE AND SCREEN
ABO/RH(D): O POS
Antibody Screen: NEGATIVE
Unit division: 0
Unit division: 0
Unit division: 0

## 2023-09-18 LAB — BPAM RBC
Blood Product Expiration Date: 202412062359
Blood Product Expiration Date: 202412082359
Blood Product Expiration Date: 202412082359
ISSUE DATE / TIME: 202411150928
ISSUE DATE / TIME: 202411150928
ISSUE DATE / TIME: 202411151218
Unit Type and Rh: 5100
Unit Type and Rh: 5100
Unit Type and Rh: 5100

## 2023-09-18 LAB — GLUCOSE, CAPILLARY
Glucose-Capillary: 111 mg/dL — ABNORMAL HIGH (ref 70–99)
Glucose-Capillary: 139 mg/dL — ABNORMAL HIGH (ref 70–99)
Glucose-Capillary: 139 mg/dL — ABNORMAL HIGH (ref 70–99)
Glucose-Capillary: 117 mg/dL — ABNORMAL HIGH (ref 70–99)
Glucose-Capillary: 106 mg/dL — ABNORMAL HIGH (ref 70–99)

## 2023-09-18 LAB — PREPARE RBC (CROSSMATCH)

## 2023-09-18 LAB — LACTIC ACID, PLASMA: Lactic Acid, Venous: 1.3 mmol/L (ref 0.5–1.9)

## 2023-09-18 MED ORDER — SODIUM CHLORIDE 0.9% IV SOLUTION: Freq: Once | INTRAVENOUS | Status: AC

## 2023-09-18 MED ORDER — MEMANTINE HCL 10 MG PO TABS
10.0000 mg | ORAL_TABLET | Freq: Two times a day (BID) | ORAL | Status: DC
  Administered 2023-09-18 – 2023-09-21 (×7): 10 mg via ORAL
  Filled 2023-09-18 (×8): qty 1

## 2023-09-18 NOTE — Plan of Care (Signed)
Transferred to ICU due to hypotension and black tarry stools, Hgb dropped to 6.6 and required 2 units RBC's today. Patient dependent with all activities, requires total care. Too weak to ambulate, feed self or turn. Unable to keep eyes open during conversation. Increasingly fatigued as day progresses and unable to participate in care. Awaiting PT consult.

## 2023-09-18 NOTE — Progress Notes (Signed)
Pt's hgb resulting at 6.6 this AM. Lab to redraw sample to confirm. Pt VSS, ABD soft/nondistended, skin warm and dry. Pt has being having green black stools since the AM of 11/16. 0600 dose of SQ heparin held until repeat hgb results.

## 2023-09-18 NOTE — Progress Notes (Signed)
NAME:  Walter [name], MRN:  [id number], DOB:  December 06, 1945, LOS: 2 ADMISSION DATE:  09/16/2023, CONSULTATION DATE:  09/17/2023 REFERRING MD: [name], MD, CHIEF COMPLAINT: Shock post abdominal aortic aneurysm repair,  History of Present Illness:   77 year old with history of abdominal aortic aneurysm, coronary artery disease, peripheral artery disease, hyperlipidemia, Parkinson's with dementia, PE/DVT status post IVC filter came in for elective repair by vascular surgery.  He had a complicated surgery with blood loss.  Now admitted to the ICU on peripheral pressors, AKI and acidosis.  He had recent hospitalization in May 2024 for compression fracture, and found to have incidental pulmonary embolism and right lower lobe subsegment.  He was started on anticoagulation but then developed a GI bleed.  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  He did not tolerate resumption of Eliquis and eventually IVC filter placed on 03/09/2023.  Pertinent  Medical History    has a past medical history of Allergy, Cancer (HCC), Cataract, Coronary artery disease, Diabetes mellitus, Diverticulosis, Esophageal stricture, GERD (gastroesophageal reflux disease), History of hiatal hernia, Hyperlipidemia, Hypertension, and Peripheral vascular disease (HCC).   Significant Hospital Events: Including procedures, antibiotic start and stop dates in addition to other pertinent events   11/15- Repair of abdominal aortic aneurysm   Interim History / Subjective:   Off pressors  Objective   Blood pressure (!) 132/55, pulse 97, temperature 99.6 F (37.6 C), temperature source Axillary, resp. rate 13, height 5\' 7"  (1.702 m), weight 66.6 kg, SpO2 95%.        Intake/Output Summary (Last 24 hours) at 09/18/2023 0826 Last data filed at 09/18/2023 0700 Gross per 24 hour  Intake 2526.57 ml  Output 150 ml  Net 2376.57 ml   Filed Weights   09/16/23 0546 09/17/23 0339 09/18/23 0618  Weight: 64.4 kg 71.8  kg 66.6 kg    Examination: Blood pressure (!) 132/55, pulse 97, temperature 99.6 F (37.6 C), temperature source Axillary, resp. rate 13, height 5\' 7"  (1.702 m), weight 66.6 kg, SpO2 95%. Gen:      No acute distress, frail, elderly HEENT:  EOMI, sclera anicteric Neck:     No masses; no thyromegaly Lungs:    Clear to auscultation bilaterally; normal respiratory effort CV:         Regular rate and rhythm; no murmurs Abd:      + bowel sounds; soft, non-tender; no palpable masses, no distension Ext:    No edema; adequate peripheral perfusion Skin:      Warm and dry; no rash Neuro: alert and oriented x 3 Psych: normal mood and affect   Labs/imaging reviewed Significant for BUN/creatinine 47/2.81 WBC 10.8, hemoglobin 6.6, platelets 82   Resolved Hospital Problem list   Hyperkalemia  Assessment & Plan:  Postoperative shock after repair of abdominal aortic aneurysm. Hemorrhagic shock Transfused 1 unit PRBC Follow CBC Recheck lactic acid  AKI Monitor urine output and creatinine.  IV fluids as above.  History of PE, DVT Cannot tolerate anticoagulation due to GI bleed.  Has IVC filter in place  Hypertension Hold amlodipine, losartan  Prediabetes SSI coverage  Hyperlipidemia Hold Lipitor  GERD PPI  Dementia. Namenda  Severe protein calorie malnutrition PO diet   Goals of care Discussed with wife.  She confirms that he is DNR.  Best Practice (right click and "Reselect all SmartList Selections" daily)   Diet/type: Regular consistency (see orders) DVT prophylaxis: prophylactic heparin  GI prophylaxis: PPI Lines: N/A Foley:  N/A Code Status:  DNR Last date of multidisciplinary goals of care discussion []   Critical care time: NA   Chilton Greathouse MD [facility] See Amion for pager  If no response to pager , please call [phone] until 7pm After 7:00 pm call Elink  [phone] 09/18/2023, 8:26 AM

## 2023-09-18 NOTE — Progress Notes (Signed)
eLink Physician-Brief Progress Note Patient Name: Walter [name] DOB: 19-Feb-1946 MRN: [id number]   Date of Service  09/18/2023  HPI/Events of Note  BSRN asking if SQ heparin should be held due to previous Hgb 6.6 and now Hgb 9.9, reported some black tarry stools during his admission  eICU Interventions  Hold for now.      Intervention Category Minor Interventions: Routine modifications to care plan (e.g. PRN medications for pain, fever)  Ranee Gosselin 09/18/2023, 11:04 PM

## 2023-09-18 NOTE — Progress Notes (Signed)
  Progress Note    09/18/2023 10:15 AM 2 Days Post-Op  Subjective: Feeling okay this morning  Vitals:   09/18/23 0805 09/18/23 0900  BP:  (!) 137/112  Pulse:  (!) 108  Resp:  (!) 27  Temp: 99.6 F (37.6 C)   SpO2:  96%    Physical Exam: Awake alert and oriented Nonlabored respirations Abdomen is soft and incisions are clean dry intact Palpable femoral pulses bilaterally with palpable right dorsalis pedis and left foot is warm and well-perfused  CBC    Component Value Date/Time   WBC 10.8 (H) 09/18/2023 0711   RBC 2.19 (L) 09/18/2023 0711   HGB 6.6 (LL) 09/18/2023 0711   HGB 11.3 (L) 04/18/2023 0816   HCT 19.9 (L) 09/18/2023 0711   HCT 36.1 (L) 04/18/2023 0816   PLT 82 (L) 09/18/2023 0711   PLT 227 04/18/2023 0816   MCV 90.9 09/18/2023 0711   MCV 86 04/18/2023 0816   MCH 30.1 09/18/2023 0711   MCHC 33.2 09/18/2023 0711   RDW 14.6 09/18/2023 0711   RDW 18.1 (H) 04/18/2023 0816   LYMPHSABS 1.5 05/30/2023 1420   LYMPHSABS 1.2 04/18/2023 0816   MONOABS 0.4 05/30/2023 1420   EOSABS 0.2 05/30/2023 1420   EOSABS 0.2 04/18/2023 0816   BASOSABS 0.0 05/30/2023 1420   BASOSABS 0.0 04/18/2023 0816    BMET    Component Value Date/Time   NA 139 09/18/2023 0331   K 3.9 09/18/2023 0331   CL 113 (H) 09/18/2023 0331   CO2 17 (L) 09/18/2023 0331   GLUCOSE 126 (H) 09/18/2023 0331   GLUCOSE 127 (H) 08/17/2006 1059   BUN 47 (H) 09/18/2023 0331   CREATININE 2.81 (H) 09/18/2023 0331   CALCIUM 7.0 (L) 09/18/2023 0331   GFRNONAA 22 (L) 09/18/2023 0331   GFRAA >60 01/07/2019 0751    INR    Component Value Date/Time   INR 1.4 (H) 09/16/2023 1620     Intake/Output Summary (Last 24 hours) at 09/18/2023 1015 Last data filed at 09/18/2023 0700 Gross per 24 hour  Intake 2225.17 ml  Output 150 ml  Net 2075.17 ml     Assessment/plan:  77 y.o. male is s/p a aorto Uni iliac aneurysm repair requiring left common iliac to common femoral bypass and left to right  femorofemoral bypass complicated by postop hemorrhagic shock requiring transfusion.  He will be transfused 2 additional units today.  Appreciate critical care assistance with this patient.    Evelena Masci C. Randie Heinz, MD [facility] Office: [phone] Pager: [phone]  09/18/2023 10:15 AM

## 2023-09-19 DIAGNOSIS — Z9889 Other specified postprocedural states: Secondary | ICD-10-CM | POA: Diagnosis not present

## 2023-09-19 LAB — POCT ACTIVATED CLOTTING TIME
Activated Clotting Time: 170 s
Activated Clotting Time: 187 s
Activated Clotting Time: 210 s
Activated Clotting Time: 239 s
Activated Clotting Time: 262 s

## 2023-09-19 LAB — BASIC METABOLIC PANEL
Anion gap: 6 (ref 5–15)
BUN: 31 mg/dL — ABNORMAL HIGH (ref 8–23)
CO2: 23 mmol/L (ref 22–32)
Calcium: 7.3 mg/dL — ABNORMAL LOW (ref 8.9–10.3)
Chloride: 110 mmol/L (ref 98–111)
Creatinine, Ser: 1.66 mg/dL — ABNORMAL HIGH (ref 0.61–1.24)
GFR, Estimated: 42 mL/min — ABNORMAL LOW (ref >60)
Glucose, Bld: 218 mg/dL — ABNORMAL HIGH (ref 70–99)
Potassium: 3.8 mmol/L (ref 3.5–5.1)
Sodium: 139 mmol/L (ref 135–145)

## 2023-09-19 LAB — CBC
HCT: 26.4 % — ABNORMAL LOW (ref 39.0–52.0)
Hemoglobin: 8.9 g/dL — ABNORMAL LOW (ref 13.0–17.0)
MCH: 30.5 pg (ref 26.0–34.0)
MCHC: 33.7 g/dL (ref 30.0–36.0)
MCV: 90.4 fL (ref 80.0–100.0)
Platelets: 79 10*3/uL — ABNORMAL LOW (ref 150–400)
RBC: 2.92 MIL/uL — ABNORMAL LOW (ref 4.22–5.81)
RDW: 14.4 % (ref 11.5–15.5)
WBC: 12 10*3/uL — ABNORMAL HIGH (ref 4.0–10.5)
nRBC: 0 % (ref 0.0–0.2)

## 2023-09-19 LAB — BPAM RBC
Blood Product Expiration Date: 202412142359
Blood Product Expiration Date: 202412142359
ISSUE DATE / TIME: 202411171047
ISSUE DATE / TIME: 202411171437
Unit Type and Rh: 5100
Unit Type and Rh: 5100

## 2023-09-19 LAB — POCT I-STAT 7, (LYTES, BLD GAS, ICA,H+H)
Acid-base deficit: 3 mmol/L — ABNORMAL HIGH (ref 0.0–2.0)
Bicarbonate: 21.3 mmol/L (ref 20.0–28.0)
Calcium, Ion: 1.12 mmol/L — ABNORMAL LOW (ref 1.15–1.40)
HCT: 24 % — ABNORMAL LOW (ref 39.0–52.0)
Hemoglobin: 8.2 g/dL — ABNORMAL LOW (ref 13.0–17.0)
O2 Saturation: 100 %
Patient temperature: 36
Potassium: 4.4 mmol/L (ref 3.5–5.1)
Sodium: 138 mmol/L (ref 135–145)
TCO2: 22 mmol/L (ref 22–32)
pCO2 arterial: 33.6 mm[Hg] (ref 32–48)
pH, Arterial: 7.407 (ref 7.35–7.45)
pO2, Arterial: 387 mm[Hg] — ABNORMAL HIGH (ref 83–108)

## 2023-09-19 LAB — TYPE AND SCREEN
ABO/RH(D): O POS
Antibody Screen: NEGATIVE
Unit division: 0
Unit division: 0

## 2023-09-19 LAB — GLUCOSE, CAPILLARY
Glucose-Capillary: 217 mg/dL — ABNORMAL HIGH (ref 70–99)
Glucose-Capillary: 97 mg/dL (ref 70–99)
Glucose-Capillary: 196 mg/dL — ABNORMAL HIGH (ref 70–99)
Glucose-Capillary: 150 mg/dL — ABNORMAL HIGH (ref 70–99)
Glucose-Capillary: 212 mg/dL — ABNORMAL HIGH (ref 70–99)

## 2023-09-19 MED ORDER — ENSURE ENLIVE PO LIQD
237.0000 mL | Freq: Two times a day (BID) | ORAL | Status: DC
  Administered 2023-09-19 – 2023-09-20 (×4): 237 mL via ORAL

## 2023-09-19 MED ORDER — CALCIUM GLUCONATE-NACL 2-0.675 GM/100ML-% IV SOLN
2.0000 g | Freq: Once | INTRAVENOUS | Status: AC
  Administered 2023-09-19: 2000 mg via INTRAVENOUS
  Filled 2023-09-19: qty 100

## 2023-09-19 MED ORDER — ATORVASTATIN CALCIUM 40 MG PO TABS
40.0000 mg | ORAL_TABLET | Freq: Every day | ORAL | Status: DC
  Administered 2023-09-20 – 2023-09-21 (×2): 40 mg via ORAL
  Filled 2023-09-19 (×3): qty 1

## 2023-09-19 NOTE — Progress Notes (Addendum)
  Progress Note    09/19/2023 7:45 AM 3 Days Post-Op  Subjective:  no complaints    Vitals:   09/19/23 0350 09/19/23 0732  BP:    Pulse:    Resp:    Temp: 100.1 F (37.8 C) 97.8 F (36.6 C)  SpO2:      Physical Exam: General:  resting comfortably Cardiac:  tachy, HR in 90s Lungs:  nonlabored Incisions:  bilateral groin incisions and lower abd incision well appearing and dry Extremities:  palpable femoral pulses bilaterally, palpable right DP pulse Abdomen:  soft, NT, ND  CBC    Component Value Date/Time   WBC 11.6 (H) 09/18/2023 1826   RBC 3.02 (L) 09/18/2023 1826   HGB 9.2 (L) 09/18/2023 1826   HGB 11.3 (L) 04/18/2023 0816   HCT 27.0 (L) 09/18/2023 1826   HCT 36.1 (L) 04/18/2023 0816   PLT 73 (L) 09/18/2023 1826   PLT 227 04/18/2023 0816   MCV 89.4 09/18/2023 1826   MCV 86 04/18/2023 0816   MCH 30.5 09/18/2023 1826   MCHC 34.1 09/18/2023 1826   RDW 14.4 09/18/2023 1826   RDW 18.1 (H) 04/18/2023 0816   LYMPHSABS 1.5 05/30/2023 1420   LYMPHSABS 1.2 04/18/2023 0816   MONOABS 0.4 05/30/2023 1420   EOSABS 0.2 05/30/2023 1420   EOSABS 0.2 04/18/2023 0816   BASOSABS 0.0 05/30/2023 1420   BASOSABS 0.0 04/18/2023 0816    BMET    Component Value Date/Time   NA 139 09/18/2023 0331   K 3.9 09/18/2023 0331   CL 113 (H) 09/18/2023 0331   CO2 17 (L) 09/18/2023 0331   GLUCOSE 126 (H) 09/18/2023 0331   GLUCOSE 127 (H) 08/17/2006 1059   BUN 47 (H) 09/18/2023 0331   CREATININE 2.81 (H) 09/18/2023 0331   CALCIUM 7.0 (L) 09/18/2023 0331   GFRNONAA 22 (L) 09/18/2023 0331   GFRAA >60 01/07/2019 0751    INR    Component Value Date/Time   INR 1.4 (H) 09/16/2023 1620     Intake/Output Summary (Last 24 hours) at 09/19/2023 0745 Last data filed at 09/19/2023 0400 Gross per 24 hour  Intake 635 ml  Output 1954 ml  Net -1319 ml      Assessment/Plan:  77 y.o. male is 3 days post op, s/p: aorto-uni iliac aneurysm repair with left common iliac to common  femoral bypass and left to right fem fem    -Stable this morning without any events overnight. Denies any pain this morning -Abd incision is well appearing and dry. Abdomen is soft, nontender, nondistended -Bilateral groin incisions well appearing. Palpable femoral pulses bilterally -LLE warm and well perfused. RLE with palpable DP pulse -Required 2U PRBCs yesterday for hgb 6.6. Post transfusion Hgb at 9.2. Pending labs this morning   [name] PA-C [facility] [phone] 09/19/2023 7:45 AM  I have independently interviewed and examined patient and agree with PA assessment and plan above.  Both feet are warm and well-perfused and the right dorsalis pedis is readily palpable and abdomen is soft.  Adequate response to transfusion yesterday and remains hemodynamically stable.  He is stable for transfer to floor today.  Adrain Butrick C. Randie Heinz, MD [facility] Office: [phone] Pager: [phone]

## 2023-09-19 NOTE — Progress Notes (Signed)
Inpatient Rehab Admissions Coordinator:    I met with Pt. To discuss potential CIR admit. He and wife are interested and wife can provide 24/7 support at d/c. I will follow and send case to insurance once medically stable.  Megan Salon, MS, CCC-SLP Rehab Admissions Coordinator  [phone] (celll) [phone] (office)

## 2023-09-19 NOTE — Anesthesia Postprocedure Evaluation (Signed)
Anesthesia Post Note  Patient: Walter [name]  Procedure(s) Performed: ABDOMINAL AORTA ANEURYSM STENT USING COOK 32 X STENT AND ENDO-ANCHORS X 7 (Groin) LEFT TO RIGHT BYPASS GRAFT FEMORAL-FEMORAL ARTERY USING HEMSHIELD GOLD GRAFT (Bilateral: Groin) BILATERAL FEMORAL ENDARTERECTOMY (Bilateral: Groin) LEFT EXTERNAL ILIAC TO COMMON FEMORAL BYPASS USING HEMASHIELD GOLD GRAFT (Left: Groin) LEFT ILIAC ENDARTERECTOMY (Left: Groin) COILING TO RIGHT COMMON ILIAC (Right: Groin)     Patient location during evaluation: PACU Anesthesia Type: General Level of consciousness: awake and alert Pain management: pain level controlled Vital Signs Assessment: post-procedure vital signs reviewed and stable Respiratory status: spontaneous breathing, nonlabored ventilation, respiratory function stable and patient connected to nasal cannula oxygen Cardiovascular status: blood pressure returned to baseline and stable Postop Assessment: no apparent nausea or vomiting Anesthetic complications: no   There were no known notable events for this encounter.  Last Vitals:  Vitals:   09/18/23 2330 09/19/23 0350  BP:    Pulse:    Resp:    Temp: 37.9 C 37.8 C  SpO2:      Last Pain:  Vitals:   09/19/23 0400  TempSrc:   PainSc: 0-No pain                 Earl Lites P Azarius Lambson

## 2023-09-19 NOTE — Progress Notes (Signed)
PHARMACIST LIPID MONITORING   Walter [name] is a 77 y.o. male admitted on 09/16/2023 now s/p a aorto Uni iliac aneurysm repair requiring left common iliac to common femoral bypass and left to right femorofemoral bypass .  Pharmacy has been consulted to optimize lipid-lowering therapy with the indication of secondary prevention for clinical ASCVD.  Recent Labs:  Lipid Panel (last 6 months):   Lab Results  Component Value Date   CHOL 75 09/17/2023   TRIG 92 09/17/2023   HDL 26 (L) 09/17/2023   CHOLHDL 2.9 09/17/2023   VLDL 18 09/17/2023   LDLCALC 31 09/17/2023    Hepatic function panel (last 6 months):   Lab Results  Component Value Date   AST 30 09/18/2023   ALT 8 09/18/2023   ALKPHOS 40 09/18/2023   BILITOT 0.2 09/18/2023    SCr (since admission):   Serum creatinine: 2.81 mg/dL (H) 29/56/21 3086 Estimated creatinine clearance: 20.3 mL/min (A)  Current therapy and lipid therapy tolerance Current lipid-lowering therapy: atorvastatin 40mg  (PTA) - currently on hold post-op. Previous lipid-lowering therapies (if applicable): n/a Documented or reported allergies or intolerances to lipid-lowering therapies (if applicable): n/a  Assessment:   LDL at goal, 31 mg/dL on current therapy. No changes needed  Plan:    1.Statin intensity (high intensity recommended for all patients regardless of the LDL):  No statin changes. The patient is already on a high intensity statin. Restart statin when appropriate.    Calton Dach, PharmD, BCCCP Clinical Pharmacist 09/19/2023 9:04 AM

## 2023-09-19 NOTE — Progress Notes (Signed)
Overview: 77 year old with history of abdominal aortic aneurysm, coronary artery disease, peripheral artery disease, hyperlipidemia, Parkinson's with dementia, PE/DVT status post IVC filter came in for elective repair by vascular surgery.  He had a complicated surgery with blood loss.  Now admitted to the ICU on peripheral pressors, AKI and acidosis.   He had recent hospitalization in May 2024 for compression fracture, and found to have incidental pulmonary embolism and right lower lobe subsegment.  He was started on anticoagulation but then developed a GI bleed.  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  He did not tolerate resumption of Eliquis and eventually IVC filter placed on 03/09/2023.   Hospital Events 11/15: Repair of abdominal aortic aneurysm 11/16: Neo stopped 11/17: Gave 2 units of blood  11/18: Transferred orders placed to move out of the ICU.   Overnight:NAEON.   Subjective: No acute concerns. States doing well.   Objective: afebrile, HR 90s, BP 140s/60s  Vital signs in last 24 hours: Vitals:   09/19/23 0350 09/19/23 0700 09/19/23 0732 09/19/23 0800  BP:  (!) 144/61  (!) 142/68  Pulse:  92  97  Resp:  12  16  Temp: 100.1 F (37.8 C)  97.8 F (36.6 C)   TempSrc: Axillary  Oral   SpO2:  94%  94%  Weight:      Height:       Supplemental O2: Room Air Last BM Date : 09/18/23 SpO2: 94 % O2 Flow Rate (L/min): 2 L/min Filed Weights   09/16/23 0546 09/17/23 0339 09/18/23 0618  Weight: 64.4 kg 71.8 kg 66.6 kg    Intake/Output Summary (Last 24 hours) at 09/19/2023 0805 Last data filed at 09/19/2023 0800 Gross per 24 hour  Intake 1115 ml  Output 1854 ml  Net -739 ml   Net IO Since Admission: 5,751.04 mL [09/19/23 0805]  Physical Exam General: NAD, sitting in bed comfortable HENT: NCAT, slight saliva dripping from the side of the mouth Lungs:  CTAB Cardiovascular: NSR, good pulses in all extremities  Abdomen: No TTP, normal bowel sounds,  surgical site looks clean without erythema MSK: no asymmetry Skin: surgical site without signs of infection Neuro: alert and oriented x4 Psych: normal mood and normal affect  Diagnostics    Latest Ref Rng & Units 09/18/2023    6:26 PM 09/18/2023    7:11 AM 09/17/2023    9:00 AM  CBC  WBC 4.0 - 10.5 K/uL 11.6  10.8    Hemoglobin 13.0 - 17.0 g/dL 9.2  6.6  C 9.2   Hematocrit 39.0 - 52.0 % 27.0  19.9  27.0   Platelets 150 - 400 K/uL 73  82      C Corrected result       Latest Ref Rng & Units 09/18/2023    3:31 AM 09/17/2023    9:00 AM 09/17/2023    3:41 AM  BMP  Glucose 70 - 99 mg/dL 829   562   BUN 8 - 23 mg/dL 47   32   Creatinine 1.30 - 1.24 mg/dL 8.65   7.84   Sodium 696 - 145 mmol/L 139  140  139   Potassium 3.5 - 5.1 mmol/L 3.9  5.4  5.6   Chloride 98 - 111 mmol/L 113   113   CO2 22 - 32 mmol/L 17   15   Calcium 8.9 - 10.3 mg/dL 7.0   7.7      Assessment/Plan: Principal Problem:   S/P abdominal aortic  aneurysm repair Active Problems:   Abdominal aortic aneurysm (HCC)  Hemorrhagic shock in setting of abdominal aortic aneurysm repair S/p 5 units of pRBC, 2 units yesterday. Hgb post transfusion improved from 6.6 to 9.2. Stable this am. Pt is off pressors since 11/16. Vascular surgery primary on the patient. Appreciate their follow up.   AKI Creatinine yesterday was 2.8. Suspect secondary to problem 1. Cr pending today but suspect it has improved. Improved this am to 1.66.  Thrombocytopenia At 79k which is improved from 73k. Will continue to monitor.  Chronic Problems HTN: Holding home bp medication. Can resume once pt's bleeding has seized.  HLD: Restart lipitor 40 mg daily Prediabetes: On SSI. Well controlled with CBG <180. GERD: Continue PPI.  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  Dementia: Continue Namenda 10 mg BID.  Severe Protein Calorie Malnutrition: Protein of 4.1, albumin of 2.1. Continue D3D. Start supplementation with nutrition shakes.   Pt is HDS. PCCM  will sign off on this patient. Transfer orders placed by vascular surgery to move out of 13M.   Diet: D3D IVF: None VTE: SCD Code: DNR/DNI PT/OT recs: Pending Prior to Admission Living Arrangement: Home Anticipated Discharge Location: Home Barriers to Discharge: Medical Stability Dispo: Anticipated discharge in approximately 3 day(s).   Gwenevere Abbot, MD Eligha Bridegroom. [facility] Internal Medicine Residency, PGY-3  Pager: [phone]

## 2023-09-19 NOTE — Progress Notes (Signed)
Physical Therapy Treatment Patient Details Name: Walter [name] MRN: [id number] DOB: [birth year] Today's Date: 09/19/2023   History of Present Illness 77 y.o. male admitted 11/15 for elective repair of abdominal aortic aneurysm. Complicated surgery with blood loss, admitted to the ICU on peripheral pressors, AKI and acidosis. PMHx: abdominal aortic aneurysm, L1 compression fx, coronary artery disease, peripheral artery disease, hyperlipidemia, Parkinson's with dementia, PE/DVT status post IVC filter, Cancer, Cataract, Diabetes mellitus, Diverticulosis, Esophageal stricture, GERD, hiatal hernia, Hypertension, and Peripheral vascular disease.    PT Comments  Good progress OOB today, BP stable, no dizziness reported. Required mod assist to stand x2 from bed and Mod assist for balance and sequencing with pivot to recliner while utilizing RW for support. Posterior lean, heavily flexed posture (baseline from PD?) Tolerated LE exercises well. Happy to be up out of bed today, denies pain. Hopeful patient can continue to increase activity tolerance in order to qualify for CIR. Will continue to progress as tolerated. Patient will continue to benefit from skilled physical therapy services to further improve independence with functional mobility.   Supine, in bed: BP 146/71 HR 100  Seated, edge of bed: BP 140/70 HR 105 Upright in recliner after transfer: BP 138/64 HR 99   SpO2 unchanged during session, 95% on RA     If plan is discharge home, recommend the following: A lot of help with walking and/or transfers;A lot of help with bathing/dressing/bathroom;Assistance with cooking/housework;Direct supervision/assist for medications management;Direct supervision/assist for financial management;Assist for transportation;Help with stairs or ramp for entrance   Can travel by private vehicle        Equipment Recommendations  None recommended by PT (TBD)    Recommendations for Other Services Rehab  consult     Precautions / Restrictions Precautions Precautions: Fall Precaution Comments: Monitor BP Restrictions Weight Bearing Restrictions: No     Mobility  Bed Mobility Overal bed mobility: Needs Assistance Bed Mobility: Supine to Sit     Supine to sit: Mod assist     General bed mobility comments: Tactile cues to facilitate sequencing of LEs out of bed. Mod assist for trunk support and scoot to EOB. Pt pulls well through UEs holding therapist's hand.    Transfers Overall transfer level: Needs assistance Equipment used: Rolling walker (2 wheels) Transfers: Sit to/from Stand, Bed to chair/wheelchair/BSC Sit to Stand: Mod assist   Step pivot transfers: Mod assist       General transfer comment: Mod assist for boost and balance to stand from bed, leaning posteriorly. Improved with second round after educating on technique and deficits noticed. Mod assist for step pivot transfer to recliner (right side) very slow and difficulty lifting RLE. Max VC for sequencing and tempo. Cues for upright posture, heavily flexed.    Ambulation/Gait                   Stairs             Wheelchair Mobility     Tilt Bed    Modified Rankin (Stroke Patients Only)       Balance Overall balance assessment: Needs assistance Sitting-balance support: Feet supported, Single extremity supported Sitting balance-Leahy Scale: Poor Sitting balance - Comments: CGA   Standing balance support: Bilateral upper extremity supported Standing balance-Leahy Scale: Poor                              Cognition Arousal: Alert Behavior During Therapy: Flat affect Overall  Cognitive Status: History of cognitive impairments - at baseline                                 General Comments: Delayed with cues for motor tasks, likely baseline with PD        Exercises General Exercises - Lower Extremity Ankle Circles/Pumps: AROM, Both, 15 reps, Seated Quad  Sets: Strengthening, Both, 10 reps, Seated Gluteal Sets: Strengthening, Both, 10 reps, Seated Long Arc Quad: Strengthening, Both, 10 reps, Seated Hip ABduction/ADduction: Strengthening, Both, 10 reps, Supine Hip Flexion/Marching: Strengthening, Both, 10 reps, Seated    General Comments General comments (skin integrity, edema, etc.): VSS throughout session no dizziness      Pertinent Vitals/Pain Pain Assessment Pain Assessment: No/denies pain Pain Intervention(s): Monitored during session    Home Living                          Prior Function            PT Goals (current goals can now be found in the care plan section) Acute Rehab PT Goals Patient Stated Goal: Get well PT Goal Formulation: With patient/family Time For Goal Achievement: 10/01/23 Potential to Achieve Goals: Good Progress towards PT goals: Progressing toward goals    Frequency    Min 1X/week      PT Plan      Co-evaluation              AM-PAC PT "6 Clicks" Mobility   Outcome Measure  Help needed turning from your back to your side while in a flat bed without using bedrails?: A Little Help needed moving from lying on your back to sitting on the side of a flat bed without using bedrails?: A Lot Help needed moving to and from a bed to a chair (including a wheelchair)?: A Lot Help needed standing up from a chair using your arms (e.g., wheelchair or bedside chair)?: A Lot Help needed to walk in hospital room?: Total Help needed climbing 3-5 steps with a railing? : Total 6 Click Score: 11    End of Session Equipment Utilized During Treatment: Gait belt Activity Tolerance: Patient tolerated treatment well Patient left: with call bell/phone within reach;in chair;with chair alarm set;with SCD's reapplied Nurse Communication: Mobility status (+2 transfer with RW) PT Visit Diagnosis: Unsteadiness on feet (R26.81);Muscle weakness (generalized) (M62.81);Difficulty in walking, not elsewhere  classified (R26.2)     Time: 1012-1030 PT Time Calculation (min) (ACUTE ONLY): 18 min  Charges:    $Therapeutic Activity: 8-22 mins PT General Charges $$ ACUTE PT VISIT: 1 Visit                     Kathlyn Sacramento, PT, DPT [facility]  Rehabilitation Services Physical Therapist Office: [phone] Website: [email or web address]    Berton Mount 09/19/2023, 10:45 AM

## 2023-09-19 NOTE — Evaluation (Signed)
Occupational Therapy Evaluation Patient Details Name: Walter [name] MRN: [id number] DOB: 03-Jul-1946 Today's Date: 09/19/2023   History of Present Illness 77 y.o. male admitted 11/15 for elective repair of abdominal aortic aneurysm. Complicated surgery with blood loss, admitted to the ICU on peripheral pressors, AKI and acidosis. PMHx: abdominal aortic aneurysm, L1 compression fx, coronary artery disease, peripheral artery disease, hyperlipidemia, Parkinson's with dementia, PE/DVT status post IVC filter, Cancer, Cataract, Diabetes mellitus, Diverticulosis, Esophageal stricture, GERD, hiatal hernia, Hypertension, and Peripheral vascular disease.   Clinical Impression   Pt uses his rollator at night and outside of the home. His wife helps him in and out of the tub, he is otherwise independent with increased time in self care. His wife manages IADLs. Pt presents with post operative pain, weakness, decreased standing balance and impaired cognition. Pt seated in chair at beginning and end of session. He needs moderate assistance to stand and demonstrates a posterior lean. Pt requires min to total assist for ADLs. Patient will benefit from intensive inpatient follow up therapy, >3 hours/day.       If plan is discharge home, recommend the following: Two people to help with walking and/or transfers;Two people to help with bathing/dressing/bathroom;Assistance with cooking/housework;Assistance with feeding;Direct supervision/assist for medications management;Direct supervision/assist for financial management;Assist for transportation;Help with stairs or ramp for entrance    Functional Status Assessment  Patient has had a recent decline in their functional status and demonstrates the ability to make significant improvements in function in a reasonable and predictable amount of time.  Equipment Recommendations  None recommended by OT    Recommendations for Other Services       Precautions /  Restrictions Precautions Precautions: Fall Precaution Comments: Monitor BP Restrictions Weight Bearing Restrictions: No      Mobility Bed Mobility               General bed mobility comments: in chair    Transfers Overall transfer level: Needs assistance Equipment used: Rolling walker (2 wheels) Transfers: Sit to/from Stand Sit to Stand: Mod assist           General transfer comment: assist to rise and transfer weight anterior, slow to rise      Balance Overall balance assessment: Needs assistance   Sitting balance-Leahy Scale: Poor     Standing balance support: Bilateral upper extremity supported Standing balance-Leahy Scale: Poor                             ADL either performed or assessed with clinical judgement   ADL Overall ADL's : Needs assistance/impaired Eating/Feeding: Set up;Sitting   Grooming: Wash/dry hands;Wash/dry face;Sitting;Moderate assistance Grooming Details (indicate cue type and reason): difficulty with hand to face Upper Body Bathing: Maximal assistance;Sitting   Lower Body Bathing: +2 for physical assistance;Total assistance;Sit to/from stand   Upper Body Dressing : Maximal assistance;Sitting   Lower Body Dressing: Total assistance;+2 for physical assistance;Sit to/from stand       Toileting- Architect and Hygiene: Total assistance;+2 for physical assistance;Sit to/from stand               Vision Ability to See in Adequate Light: 0 Adequate Patient Visual Report: No change from baseline       Perception         Praxis         Pertinent Vitals/Pain Pain Assessment Pain Assessment: Faces Faces Pain Scale: Hurts little more Pain Location: lower abdomen Pain Descriptors /  Indicators: Sore Pain Intervention(s): Monitored during session, Repositioned, Premedicated before session     Extremity/Trunk Assessment Upper Extremity Assessment Upper Extremity Assessment: Generalized weakness;RUE  deficits/detail;LUE deficits/detail (edema in hands) RUE Coordination: decreased fine motor;decreased gross motor LUE Coordination: decreased fine motor;decreased gross motor   Lower Extremity Assessment Lower Extremity Assessment: Defer to PT evaluation   Cervical / Trunk Assessment Cervical / Trunk Assessment: Other exceptions (weakness)   Communication Communication Communication: Other (comment) (low volume)   Cognition Arousal: Alert Behavior During Therapy: Flat affect Overall Cognitive Status: History of cognitive impairments - at baseline                                 General Comments: slow response to questions and to commands with motor output     General Comments  VSS throughout session no dizziness    Exercises     Shoulder Instructions      Home Living Family/patient expects to be discharged to:: Private residence Living Arrangements: Spouse/significant other Available Help at Discharge: Family;Available PRN/intermittently Type of Home: House Home Access: Stairs to enter [employer] of Steps: 1 Entrance Stairs-Rails: None Home Layout: Two level;Able to live on main level with bedroom/bathroom     Bathroom Shower/Tub: Chief Strategy Officer: Standard     Home Equipment: Rollator (4 wheels);BSC/3in1;Toilet riser;Rolling Walker (2 wheels);Grab bars - toilet;Grab bars - tub/shower;Shower seat          Prior Functioning/Environment Prior Level of Function : Needs assist             Mobility Comments: Uses rollator in the morning when he first wakes up, or if he has to walk to the bathroom at night ADLs Comments: Wife assisted with pt getting in and out of the tub and IADLs, pt otherwise independent        OT Problem List: Decreased strength;Impaired balance (sitting and/or standing);Decreased coordination;Decreased cognition;Decreased safety awareness;Decreased knowledge of use of DME or AE;Impaired UE  functional use;Pain      OT Treatment/Interventions: Self-care/ADL training;DME and/or AE instruction;Therapeutic activities;Patient/family education;Balance training;Cognitive remediation/compensation    OT Goals(Current goals can be found in the care plan section) Acute Rehab OT Goals OT Goal Formulation: With patient/family Time For Goal Achievement: 10/03/23 Potential to Achieve Goals: Good ADL Goals Pt Will Perform Grooming: with contact guard assist;standing Pt Will Perform Upper Body Dressing: with supervision;sitting Pt Will Perform Lower Body Dressing: with mod assist;sit to/from stand Pt Will Transfer to Toilet: with contact guard assist;ambulating;bedside commode Pt Will Perform Toileting - Clothing Manipulation and hygiene: with min assist;sit to/from stand Additional ADL Goal #1: Pt will complete bed mobility modified independently in preparation for ADLs using log roll technique.  OT Frequency: Min 1X/week    Co-evaluation              AM-PAC OT "6 Clicks" Daily Activity     Outcome Measure Help from another person eating meals?: A Little Help from another person taking care of personal grooming?: A Lot Help from another person toileting, which includes using toliet, bedpan, or urinal?: Total Help from another person bathing (including washing, rinsing, drying)?: A Lot Help from another person to put on and taking off regular upper body clothing?: A Lot Help from another person to put on and taking off regular lower body clothing?: Total 6 Click Score: 11   End of Session Equipment Utilized During Treatment: Rolling walker (2 wheels)  Activity Tolerance: Patient tolerated treatment well Patient left: in chair;with call bell/phone within reach;with chair alarm set;with family/visitor present;with nursing/sitter in room  OT Visit Diagnosis: Muscle weakness (generalized) (M62.81);Pain;Unsteadiness on feet (R26.81);Other abnormalities of gait and mobility  (R26.89);Other symptoms and signs involving cognitive function                Time: 1610-9604 OT Time Calculation (min): 20 min Charges:  OT General Charges $OT Visit: 1 Visit OT Evaluation $OT Eval Moderate Complexity: 1 Mod  Berna Spare, OTR/L Acute Rehabilitation Services Office: [phone]   Evern Bio 09/19/2023, 12:53 PM

## 2023-09-20 ENCOUNTER — Encounter (HOSPITAL_COMMUNITY): Payer: Self-pay | Admitting: Vascular Surgery

## 2023-09-20 DIAGNOSIS — Z8679 Personal history of other diseases of the circulatory system: Secondary | ICD-10-CM | POA: Diagnosis not present

## 2023-09-20 DIAGNOSIS — G20B2 Parkinson's disease with dyskinesia, with fluctuations: Secondary | ICD-10-CM | POA: Diagnosis not present

## 2023-09-20 DIAGNOSIS — R5381 Other malaise: Secondary | ICD-10-CM | POA: Diagnosis not present

## 2023-09-20 DIAGNOSIS — Z9889 Other specified postprocedural states: Secondary | ICD-10-CM | POA: Diagnosis not present

## 2023-09-20 LAB — BASIC METABOLIC PANEL
Anion gap: 9 (ref 5–15)
BUN: 26 mg/dL — ABNORMAL HIGH (ref 8–23)
CO2: 21 mmol/L — ABNORMAL LOW (ref 22–32)
Calcium: 7.9 mg/dL — ABNORMAL LOW (ref 8.9–10.3)
Chloride: 108 mmol/L (ref 98–111)
Creatinine, Ser: 1.29 mg/dL — ABNORMAL HIGH (ref 0.61–1.24)
GFR, Estimated: 57 mL/min — ABNORMAL LOW (ref >60)
Glucose, Bld: 189 mg/dL — ABNORMAL HIGH (ref 70–99)
Potassium: 4 mmol/L (ref 3.5–5.1)
Sodium: 138 mmol/L (ref 135–145)

## 2023-09-20 LAB — CBC
HCT: 26.7 % — ABNORMAL LOW (ref 39.0–52.0)
Hemoglobin: 8.8 g/dL — ABNORMAL LOW (ref 13.0–17.0)
MCH: 30.1 pg (ref 26.0–34.0)
MCHC: 33 g/dL (ref 30.0–36.0)
MCV: 91.4 fL (ref 80.0–100.0)
Platelets: 86 10*3/uL — ABNORMAL LOW (ref 150–400)
RBC: 2.92 MIL/uL — ABNORMAL LOW (ref 4.22–5.81)
RDW: 14.4 % (ref 11.5–15.5)
WBC: 9.8 10*3/uL (ref 4.0–10.5)
nRBC: 0 % (ref 0.0–0.2)

## 2023-09-20 LAB — GLUCOSE, CAPILLARY
Glucose-Capillary: 175 mg/dL — ABNORMAL HIGH (ref 70–99)
Glucose-Capillary: 158 mg/dL — ABNORMAL HIGH (ref 70–99)
Glucose-Capillary: 181 mg/dL — ABNORMAL HIGH (ref 70–99)
Glucose-Capillary: 191 mg/dL — ABNORMAL HIGH (ref 70–99)

## 2023-09-20 MED ORDER — HYDRALAZINE HCL 20 MG/ML IJ SOLN
10.0000 mg | INTRAMUSCULAR | Status: DC | PRN
  Administered 2023-09-21: 10 mg via INTRAVENOUS
  Filled 2023-09-20: qty 1

## 2023-09-20 NOTE — Plan of Care (Signed)
  Problem: Education: Goal: Knowledge of discharge needs will improve Outcome: Progressing   Problem: Clinical Measurements: Goal: Postoperative complications will be avoided or minimized Outcome: Progressing   Problem: Respiratory: Goal: Ability to achieve and maintain a regular respiratory rate will improve Outcome: Progressing   Problem: Skin Integrity: Goal: Demonstration of wound healing without infection will improve Outcome: Progressing   Problem: Education: Goal: Knowledge of General Education information will improve Description: Including pain rating scale, medication(s)/side effects and non-pharmacologic comfort measures Outcome: Progressing   Problem: Health Behavior/Discharge Planning: Goal: Ability to manage health-related needs will improve Outcome: Progressing   Problem: Clinical Measurements: Goal: Ability to maintain clinical measurements within normal limits will improve Outcome: Progressing Goal: Will remain free from infection Outcome: Progressing Goal: Diagnostic test results will improve Outcome: Progressing Goal: Respiratory complications will improve Outcome: Progressing Goal: Cardiovascular complication will be avoided Outcome: Progressing   Problem: Activity: Goal: Risk for activity intolerance will decrease Outcome: Progressing   Problem: Nutrition: Goal: Adequate nutrition will be maintained Outcome: Progressing   Problem: Coping: Goal: Level of anxiety will decrease Outcome: Progressing   Problem: Elimination: Goal: Will not experience complications related to bowel motility Outcome: Progressing Goal: Will not experience complications related to urinary retention Outcome: Progressing   Problem: Pain Management: Goal: General experience of comfort will improve Outcome: Progressing   Problem: Safety: Goal: Ability to remain free from injury will improve Outcome: Progressing   Problem: Skin Integrity: Goal: Risk for impaired  skin integrity will decrease Outcome: Progressing   Problem: Education: Goal: Ability to describe self-care measures that may prevent or decrease complications (Diabetes Survival Skills Education) will improve Outcome: Progressing   Problem: Coping: Goal: Ability to adjust to condition or change in health will improve Outcome: Progressing   Problem: Fluid Volume: Goal: Ability to maintain a balanced intake and output will improve Outcome: Progressing   Problem: Health Behavior/Discharge Planning: Goal: Ability to identify and utilize available resources and services will improve Outcome: Progressing Goal: Ability to manage health-related needs will improve Outcome: Progressing   Problem: Metabolic: Goal: Ability to maintain appropriate glucose levels will improve Outcome: Progressing   Problem: Nutritional: Goal: Maintenance of adequate nutrition will improve Outcome: Progressing Goal: Progress toward achieving an optimal weight will improve Outcome: Progressing   Problem: Skin Integrity: Goal: Risk for impaired skin integrity will decrease Outcome: Progressing   Problem: Tissue Perfusion: Goal: Adequacy of tissue perfusion will improve Outcome: Progressing   Problem: Education: Goal: Knowledge of prescribed regimen will improve Outcome: Progressing   Problem: Activity: Goal: Ability to tolerate increased activity will improve Outcome: Progressing   Problem: Bowel/Gastric: Goal: Gastrointestinal status for postoperative course will improve Outcome: Progressing   Problem: Clinical Measurements: Goal: Postoperative complications will be avoided or minimized Outcome: Progressing Goal: Signs and symptoms of graft occlusion will improve Outcome: Progressing   Problem: Skin Integrity: Goal: Demonstration of wound healing without infection will improve Outcome: Progressing

## 2023-09-20 NOTE — Progress Notes (Signed)
Inpatient Rehab Admissions Coordinator:    CIR following. Rehab MD to consult today. Will likely send case to insurance once consult is in.  Megan Salon, MS, CCC-SLP Rehab Admissions Coordinator  [phone] (celll) [phone] (office)

## 2023-09-20 NOTE — Progress Notes (Addendum)
  Progress Note    09/20/2023 9:28 AM 4 Days Post-Op  Subjective:  no complaints    Vitals:   09/20/23 0700 09/20/23 0800  BP: 129/61   Pulse: 79   Resp: 16   Temp:  98.5 F (36.9 C)  SpO2: 95%     Physical Exam: General:  resting in bed comfortably Cardiac:  regular Lungs:  nonlabored Incisions:  abdominal incision and bilateral groin incisions intact and soft without hematoma Extremities:  palpable femoral pulses, palpable right DP pulse Abdomen:  soft, NT ,ND  CBC    Component Value Date/Time   WBC 12.0 (H) 09/19/2023 0941   RBC 2.92 (L) 09/19/2023 0941   HGB 8.9 (L) 09/19/2023 0941   HGB 11.3 (L) 04/18/2023 0816   HCT 26.4 (L) 09/19/2023 0941   HCT 36.1 (L) 04/18/2023 0816   PLT 79 (L) 09/19/2023 0941   PLT 227 04/18/2023 0816   MCV 90.4 09/19/2023 0941   MCV 86 04/18/2023 0816   MCH 30.5 09/19/2023 0941   MCHC 33.7 09/19/2023 0941   RDW 14.4 09/19/2023 0941   RDW 18.1 (H) 04/18/2023 0816   LYMPHSABS 1.5 05/30/2023 1420   LYMPHSABS 1.2 04/18/2023 0816   MONOABS 0.4 05/30/2023 1420   EOSABS 0.2 05/30/2023 1420   EOSABS 0.2 04/18/2023 0816   BASOSABS 0.0 05/30/2023 1420   BASOSABS 0.0 04/18/2023 0816    BMET    Component Value Date/Time   NA 139 09/19/2023 0941   K 3.8 09/19/2023 0941   CL 110 09/19/2023 0941   CO2 23 09/19/2023 0941   GLUCOSE 218 (H) 09/19/2023 0941   GLUCOSE 127 (H) 08/17/2006 1059   BUN 31 (H) 09/19/2023 0941   CREATININE 1.66 (H) 09/19/2023 0941   CALCIUM 7.3 (L) 09/19/2023 0941   GFRNONAA 42 (L) 09/19/2023 0941   GFRAA >60 01/07/2019 0751    INR    Component Value Date/Time   INR 1.4 (H) 09/16/2023 1620     Intake/Output Summary (Last 24 hours) at 09/20/2023 0928 Last data filed at 09/20/2023 0800 Gross per 24 hour  Intake 1050.03 ml  Output 1550 ml  Net -499.97 ml      Assessment/Plan:  77 y.o. 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 Denies any pain. No events overnight -BLE warm and well perfused. Palpable femoral pulses. Palpable right DP pulse -Abdominal incision and bilateral groin incisions well appearing and dry -Hgb yesterday morning stable at 8.9. Morning labs pending -Pending transfer to 4E   [name] PA-C [facility] [phone] 09/20/2023 9:28 AM   I have independently interviewed and examined patient and agree with PA assessment and plan above.  Continues to have good urine output and creatinine trending towards baseline.  Transferred to 4 E. when bed available and continue out of bed as tolerated.  Working on [employer] admission.  Taydon Nasworthy C. Randie Heinz, MD [facility] [place] Office: [phone] Pager: [phone]

## 2023-09-20 NOTE — Consult Note (Signed)
Physical Medicine and Rehabilitation Consult Reason for Consult:debility after abdominal aortic aneurysm repair Referring Physician: Randie Heinz   HPI: Walter [name] is a 77 y.o. male with a history of an abdominal aortic aneurysm, as well as Parkinson's disease with dementia, diabetes, CAD, hypertension, peripheral artery disease who elected to go repair of said aneurysm and was admitted on 09/16/2023 for the surgery.  Postoperative course was complicated by acute blood loss anemia requiring 5 units of packed red blood cells and pressors, acute kidney injuries and metabolic acidosis.  Patient has stabilized but is significantly deconditioned and dealing with severe protein calorie malnutrition as well.  He was up with physical therapy yesterday and mod assist for sit to stand transfers but gait was not attempted as he was very slow advancing his right leg with significantly flexed posture.  Occupational Therapy saw the patient as well yesterday and he was max to total assistance for basic self-care tasks.  Patient lives at home with his wife in a two-level house with one-step to enter.  They are able to live on the main level with the bedroom and bathroom.  Prior to this admission patient used a rollator typically in the morning and for longer distances.  Wife assists him with basic ADLs as far as getting in and out of the bathtub/bathroom.  Otherwise he is reported as being independent.   Review of Systems  Constitutional:  Positive for malaise/fatigue. Negative for fever.  HENT: Negative.    Eyes: Negative.   Respiratory:  Positive for cough and shortness of breath.   Cardiovascular: Negative.   Gastrointestinal:  Positive for abdominal pain.  Genitourinary: Negative.   Skin:  Negative for rash.  Neurological:  Positive for focal weakness and weakness.  Psychiatric/Behavioral: Negative.     Past Medical History:  Diagnosis Date   Allergy    Cancer [facility])    Skin cancer   Cataract     Coronary artery disease    Diabetes mellitus    Diverticulosis    Esophageal stricture    GERD (gastroesophageal reflux disease)    History of hiatal hernia    Hyperlipidemia    Hypertension    Peripheral vascular disease (HCC)    Past Surgical History:  Procedure Laterality Date   BIOPSY  03/07/2023   Procedure: BIOPSY;  Surgeon: Beverley Fiedler, MD;  Location: [facility] ENDOSCOPY;  Service: Gastroenterology;;   CATARACT EXTRACTION Bilateral    COLONOSCOPY WITH PROPOFOL N/A 03/07/2023   Procedure: COLONOSCOPY WITH PROPOFOL;  Surgeon: Beverley Fiedler, MD;  Location: [facility] ENDOSCOPY;  Service: Gastroenterology;  Laterality: N/A;   CORONARY ANGIOPLASTY WITH STENT PLACEMENT  2004   ESOPHAGOGASTRODUODENOSCOPY  06/07/2012   Procedure: ESOPHAGOGASTRODUODENOSCOPY (EGD);  Surgeon: Hart Carwin, MD;  Location: [facility] ENDOSCOPY;  Service: Endoscopy;  Laterality: N/A;   ESOPHAGOGASTRODUODENOSCOPY (EGD) WITH PROPOFOL N/A 03/07/2023   Procedure: ESOPHAGOGASTRODUODENOSCOPY (EGD) WITH PROPOFOL;  Surgeon: Beverley Fiedler, MD;  Location: [facility] ENDOSCOPY;  Service: Gastroenterology;  Laterality: N/A;   HOT HEMOSTASIS N/A 03/07/2023   Procedure: HOT HEMOSTASIS (ARGON PLASMA COAGULATION/BICAP);  Surgeon: Beverley Fiedler, MD;  Location: [facility] ENDOSCOPY;  Service: Gastroenterology;  Laterality: N/A;   IR KYPHO LUMBAR INC FX REDUCE BONE BX UNI/BIL CANNULATION INC/IMAGING  03/02/2023   IVC FILTER INSERTION N/A 03/09/2023   Procedure: IVC FILTER INSERTION;  Surgeon: Victorino Sparrow, MD;  Location: [facility] INVASIVE CV LAB;  Service: Cardiovascular;  Laterality: N/A;   POLYPECTOMY  03/07/2023   Procedure: POLYPECTOMY;  Surgeon: Beverley Fiedler, MD;  Location: [facility] ENDOSCOPY;  Service: Gastroenterology;;   SHOULDER SURGERY  11/01/2004   Family History  Problem Relation Age of Onset   Breast cancer Mother    Healthy Son    Healthy Son    Parkinson's disease Sister    Colon cancer Neg Hx    Esophageal cancer Neg Hx    Rectal cancer Neg Hx     Stomach cancer Neg Hx    Social History:  reports that he quit smoking about 23 years ago. His smoking use included cigarettes. He has never used smokeless tobacco. He reports that he does not drink alcohol and does not use drugs. Allergies: No Known Allergies Medications Prior to Admission  Medication Sig Dispense Refill   amLODipine (NORVASC) 5 MG tablet Take 1 tablet by mouth once daily 90 tablet 1   aspirin EC 81 MG tablet Take 1 tablet (81 mg total) by mouth daily. Swallow whole.     atorvastatin (LIPITOR) 40 MG tablet Take 1 tablet by mouth once daily 90 tablet 0   cyanocobalamin 2000 MCG tablet Take 1 tablet (2,000 mcg total) by mouth daily. 30 tablet 0   Ferrous Sulfate (IRON) 325 (65 Fe) MG TABS Take 1 tablet by mouth once daily 30 tablet 0   loratadine (ALLERGY RELIEF) 10 MG tablet Take 10 mg by mouth daily.     losartan (COZAAR) 100 MG tablet Take 1 tablet by mouth once daily 90 tablet 0   memantine (NAMENDA) 10 MG tablet Take 1 tablet twice a day 180 tablet 3   Multiple Vitamin (MULTIVITAMIN WITH MINERALS) TABS tablet Take 1 tablet by mouth daily.     Blood Glucose Monitoring Suppl (ONE TOUCH ULTRA 2) w/Device KIT 1 kit by Does not apply route daily. 1 kit 0   Lancets (ONETOUCH ULTRASOFT) lancets Check blood sugars once daily. Dx: E11.51 100 each 12   ONETOUCH ULTRA test strip USE 1 STRIP TO CHECK GLUCOSE ONCE DAILY 100 each 2   pantoprazole (PROTONIX) 40 MG tablet Take 1 tablet (40 mg total) by mouth daily.      Home: Home Living Family/patient expects to be discharged to:: Private residence Living Arrangements: Spouse/significant other Available Help at Discharge: Family, Available PRN/intermittently Type of Home: House Home Access: Stairs to enter Secretary/administrator of Steps: 1 Entrance Stairs-Rails: None Home Layout: Two level, Able to live on main level with bedroom/bathroom Bathroom Shower/Tub: Engineer, manufacturing systems: Standard Bathroom  Accessibility: Yes Home Equipment: Rollator (4 wheels), BSC/3in1, Toilet riser, Rolling Walker (2 wheels), Grab bars - toilet, Grab bars - tub/shower, Shower seat  Functional History: Prior Function Prior Level of Function : Needs assist Mobility Comments: Uses rollator in the morning when he first wakes up, or if he has to walk to the bathroom at night ADLs Comments: Wife assisted with pt getting in and out of the tub and IADLs, pt otherwise independent Functional Status:  Mobility: Bed Mobility Overal bed mobility: Needs Assistance Bed Mobility: Supine to Sit Rolling: Min assist, Used rails Sidelying to sit: Mod assist, Used rails, HOB elevated Supine to sit: Mod assist Sit to sidelying: Min assist General bed mobility comments: in chair Transfers Overall transfer level: Needs assistance Equipment used: Rolling walker (2 wheels) Transfers: Sit to/from Stand Sit to Stand: Mod assist Bed to/from chair/wheelchair/BSC transfer type:: Step pivot Step pivot transfers: Mod assist General transfer comment: assist to rise and transfer weight anterior, slow to rise Ambulation/Gait General Gait Details: Deferred to  low BP with mobility.    ADL: ADL Overall ADL's : Needs assistance/impaired Eating/Feeding: Set up, Sitting Grooming: Wash/dry hands, Wash/dry face, Sitting, Moderate assistance Grooming Details (indicate cue type and reason): difficulty with hand to face Upper Body Bathing: Maximal assistance, Sitting Lower Body Bathing: +2 for physical assistance, Total assistance, Sit to/from stand Upper Body Dressing : Maximal assistance, Sitting Lower Body Dressing: Total assistance, +2 for physical assistance, Sit to/from stand Toileting- Architect and Hygiene: Total assistance, +2 for physical assistance, Sit to/from stand  Cognition: Cognition Overall Cognitive Status: History of cognitive impairments - at baseline Orientation Level: Oriented X4 Cognition Arousal:  Alert Behavior During Therapy: Flat affect Overall Cognitive Status: History of cognitive impairments - at baseline General Comments: slow response to questions and to commands with motor output  Blood pressure (!) 150/62, pulse 89, temperature 98.5 F (36.9 C), temperature source Oral, resp. rate 19, height 5\' 7"  (1.702 m), weight 66.6 kg, SpO2 94%. Physical Exam Constitutional:      General: He is not in acute distress. HENT:     Head: Normocephalic.     Nose: Nose normal.     Mouth/Throat:     Mouth: Mucous membranes are moist.  Eyes:     Extraocular Movements: Extraocular movements intact.     Pupils: Pupils are equal, round, and reactive to light.  Cardiovascular:     Rate and Rhythm: Normal rate and regular rhythm.  Pulmonary:     Effort: Pulmonary effort is normal.  Abdominal:     Palpations: Abdomen is soft.  Musculoskeletal:        General: No swelling or tenderness.     Cervical back: Normal range of motion.  Skin:    Comments: Abdominal and groin incisions CDI  Neurological:     Mental Status: He is alert.     Comments: Alert and oriented x 3. Normal insight and awareness. Intact Memory. Normal language. Speech sl dysarthric, masked facies. Cranial nerve exam unremarkable. MMT: LUE 4-/5, RUE 4+/5. BLE 3- to 3/5 HF, 4- KE and 4/5 ADF/PF. +cogwheeling rigidity, LUE>RUE and to a lesser extent in the LE's. Senses pain and LT in all 4's.    Psychiatric:        Mood and Affect: Mood normal.        Behavior: Behavior normal.     Results for orders placed or performed during the hospital encounter of 09/16/23 (from the past 24 hour(s))  Glucose, capillary     Status: Abnormal   Collection Time: 09/19/23 11:14 AM  Result Value Ref Range   Glucose-Capillary 196 (H) 70 - 99 mg/dL  Glucose, capillary     Status: Abnormal   Collection Time: 09/19/23  5:17 PM  Result Value Ref Range   Glucose-Capillary 150 (H) 70 - 99 mg/dL  Glucose, capillary     Status: Abnormal    Collection Time: 09/19/23  9:21 PM  Result Value Ref Range   Glucose-Capillary 212 (H) 70 - 99 mg/dL  Glucose, capillary     Status: Abnormal   Collection Time: 09/20/23  8:41 AM  Result Value Ref Range   Glucose-Capillary 175 (H) 70 - 99 mg/dL   No results found.  Assessment/Plan: Diagnosis: 77 year old male with Parkinson's disease status post abdominal aortic aneurysm repair. He is substantially deconditioned.  Does the need for close, 24 hr/day medical supervision in concert with the patient's rehab needs make it unreasonable for this patient to be served in a less intensive setting? Yes Co-Morbidities  requiring supervision/potential complications:  -Hemorrhagic shock status post 5 units packed red blood cells -Acute kidney injury -Hypertension -Severe protein calorie malnutrition -Dementia Due to bladder management, bowel management, safety, skin/wound care, disease management, medication administration, pain management, and patient education, does the patient require 24 hr/day rehab nursing? Yes Does the patient require coordinated care of a physician, rehab nurse, therapy disciplines of PT, OT to address physical and functional deficits in the context of the above medical diagnosis(es)? Yes Addressing deficits in the following areas: balance, endurance, locomotion, strength, transferring, bowel/bladder control, bathing, dressing, feeding, grooming, toileting, and psychosocial support Can the patient actively participate in an intensive therapy program of at least 3 hrs of therapy per day at least 5 days per week? Yes The potential for patient to make measurable gains while on inpatient rehab is excellent Anticipated functional outcomes upon discharge from inpatient rehab are supervision and min assist  with PT, supervision and min assist with OT, n/a with SLP. Estimated rehab length of stay to reach the above functional goals is: 14-20 days Anticipated discharge destination:  Home Overall Rehab/Functional Prognosis: excellent  POST ACUTE RECOMMENDATIONS: This patient's condition is appropriate for continued rehabilitative care in the following setting: CIR Patient has agreed to participate in recommended program. Yes Note that insurance prior authorization may be required for reimbursement for recommended care.  Comment: Pt at home with wife. He was independent with rollator at home except for showering for which he required supervision. Rehab Admissions Coordinator to follow up.       I have personally performed a face to face diagnostic evaluation of this patient. Additionally, I have examined the patient's medical record including any pertinent labs and radiographic images. If the physician assistant has documented in this note, I have reviewed and edited or otherwise concur with the physician assistant's documentation.  Thanks,  Ranelle Oyster, MD 09/20/2023

## 2023-09-20 NOTE — Plan of Care (Signed)
  Problem: Respiratory: Goal: Ability to achieve and maintain a regular respiratory rate will improve Outcome: Progressing   Problem: Skin Integrity: Goal: Demonstration of wound healing without infection will improve Outcome: Progressing   Problem: Pain Management: Goal: General experience of comfort will improve Outcome: Progressing   Problem: Safety: Goal: Ability to remain free from injury will improve Outcome: Progressing   Problem: Skin Integrity: Goal: Risk for impaired skin integrity will decrease Outcome: Progressing

## 2023-09-21 ENCOUNTER — Encounter (HOSPITAL_COMMUNITY): Payer: Self-pay | Admitting: Physical Medicine and Rehabilitation

## 2023-09-21 ENCOUNTER — Inpatient Hospital Stay (HOSPITAL_COMMUNITY)
Admission: AD | Admit: 2023-09-21 | Discharge: 2023-10-04 | DRG: 949 | Disposition: A | Discharge status: Home Health | Payer: Medicare HMO | Source: Intra-hospital | Attending: Physical Medicine and Rehabilitation | Admitting: Physical Medicine and Rehabilitation

## 2023-09-21 DIAGNOSIS — M542 Cervicalgia: Secondary | ICD-10-CM | POA: Diagnosis not present

## 2023-09-21 DIAGNOSIS — F0390 Unspecified dementia without behavioral disturbance: Secondary | ICD-10-CM | POA: Diagnosis not present

## 2023-09-21 DIAGNOSIS — K264 Chronic or unspecified duodenal ulcer with hemorrhage: Secondary | ICD-10-CM | POA: Diagnosis not present

## 2023-09-21 DIAGNOSIS — Z66 Do not resuscitate: Secondary | ICD-10-CM | POA: Diagnosis not present

## 2023-09-21 DIAGNOSIS — E785 Hyperlipidemia, unspecified: Secondary | ICD-10-CM | POA: Diagnosis not present

## 2023-09-21 DIAGNOSIS — Z87891 Personal history of nicotine dependence: Secondary | ICD-10-CM

## 2023-09-21 DIAGNOSIS — Z8719 Personal history of other diseases of the digestive system: Secondary | ICD-10-CM

## 2023-09-21 DIAGNOSIS — K219 Gastro-esophageal reflux disease without esophagitis: Secondary | ICD-10-CM | POA: Diagnosis present

## 2023-09-21 DIAGNOSIS — Z820 Family history of epilepsy and other diseases of the nervous system: Secondary | ICD-10-CM

## 2023-09-21 DIAGNOSIS — Z85828 Personal history of other malignant neoplasm of skin: Secondary | ICD-10-CM | POA: Diagnosis not present

## 2023-09-21 DIAGNOSIS — I10 Essential (primary) hypertension: Secondary | ICD-10-CM | POA: Diagnosis present

## 2023-09-21 DIAGNOSIS — E876 Hypokalemia: Secondary | ICD-10-CM | POA: Diagnosis present

## 2023-09-21 DIAGNOSIS — Z95828 Presence of other vascular implants and grafts: Secondary | ICD-10-CM

## 2023-09-21 DIAGNOSIS — Z955 Presence of coronary angioplasty implant and graft: Secondary | ICD-10-CM

## 2023-09-21 DIAGNOSIS — Z7984 Long term (current) use of oral hypoglycemic drugs: Secondary | ICD-10-CM

## 2023-09-21 DIAGNOSIS — E1151 Type 2 diabetes mellitus with diabetic peripheral angiopathy without gangrene: Secondary | ICD-10-CM | POA: Diagnosis present

## 2023-09-21 DIAGNOSIS — K31811 Angiodysplasia of stomach and duodenum with bleeding: Secondary | ICD-10-CM | POA: Diagnosis not present

## 2023-09-21 DIAGNOSIS — R5381 Other malaise: Secondary | ICD-10-CM | POA: Diagnosis not present

## 2023-09-21 DIAGNOSIS — I2510 Atherosclerotic heart disease of native coronary artery without angina pectoris: Secondary | ICD-10-CM | POA: Diagnosis not present

## 2023-09-21 DIAGNOSIS — Z7982 Long term (current) use of aspirin: Secondary | ICD-10-CM

## 2023-09-21 DIAGNOSIS — Z8679 Personal history of other diseases of the circulatory system: Secondary | ICD-10-CM

## 2023-09-21 DIAGNOSIS — D62 Acute posthemorrhagic anemia: Secondary | ICD-10-CM | POA: Diagnosis not present

## 2023-09-21 DIAGNOSIS — Z48812 Encounter for surgical aftercare following surgery on the circulatory system: Principal | ICD-10-CM

## 2023-09-21 DIAGNOSIS — K449 Diaphragmatic hernia without obstruction or gangrene: Secondary | ICD-10-CM | POA: Diagnosis not present

## 2023-09-21 DIAGNOSIS — N179 Acute kidney failure, unspecified: Secondary | ICD-10-CM | POA: Diagnosis present

## 2023-09-21 DIAGNOSIS — K5710 Diverticulosis of small intestine without perforation or abscess without bleeding: Secondary | ICD-10-CM | POA: Diagnosis present

## 2023-09-21 DIAGNOSIS — I739 Peripheral vascular disease, unspecified: Secondary | ICD-10-CM | POA: Diagnosis not present

## 2023-09-21 DIAGNOSIS — D509 Iron deficiency anemia, unspecified: Secondary | ICD-10-CM | POA: Diagnosis present

## 2023-09-21 DIAGNOSIS — Z79899 Other long term (current) drug therapy: Secondary | ICD-10-CM

## 2023-09-21 DIAGNOSIS — K5730 Diverticulosis of large intestine without perforation or abscess without bleeding: Secondary | ICD-10-CM | POA: Diagnosis not present

## 2023-09-21 DIAGNOSIS — N281 Cyst of kidney, acquired: Secondary | ICD-10-CM | POA: Diagnosis not present

## 2023-09-21 DIAGNOSIS — K5520 Angiodysplasia of colon without hemorrhage: Secondary | ICD-10-CM

## 2023-09-21 DIAGNOSIS — D696 Thrombocytopenia, unspecified: Secondary | ICD-10-CM | POA: Diagnosis not present

## 2023-09-21 DIAGNOSIS — L03314 Cellulitis of groin: Secondary | ICD-10-CM | POA: Diagnosis not present

## 2023-09-21 DIAGNOSIS — K5900 Constipation, unspecified: Secondary | ICD-10-CM | POA: Diagnosis not present

## 2023-09-21 DIAGNOSIS — R1310 Dysphagia, unspecified: Secondary | ICD-10-CM | POA: Diagnosis present

## 2023-09-21 DIAGNOSIS — I7143 Infrarenal abdominal aortic aneurysm, without rupture: Secondary | ICD-10-CM | POA: Diagnosis not present

## 2023-09-21 DIAGNOSIS — R739 Hyperglycemia, unspecified: Secondary | ICD-10-CM | POA: Diagnosis not present

## 2023-09-21 DIAGNOSIS — Z860100 Personal history of colon polyps, unspecified: Secondary | ICD-10-CM

## 2023-09-21 DIAGNOSIS — D649 Anemia, unspecified: Secondary | ICD-10-CM | POA: Diagnosis not present

## 2023-09-21 DIAGNOSIS — Z86711 Personal history of pulmonary embolism: Secondary | ICD-10-CM

## 2023-09-21 LAB — GLUCOSE, CAPILLARY
Glucose-Capillary: 129 mg/dL — ABNORMAL HIGH (ref 70–99)
Glucose-Capillary: 180 mg/dL — ABNORMAL HIGH (ref 70–99)
Glucose-Capillary: 274 mg/dL — ABNORMAL HIGH (ref 70–99)
Glucose-Capillary: 132 mg/dL — ABNORMAL HIGH (ref 70–99)
Glucose-Capillary: 137 mg/dL — ABNORMAL HIGH (ref 70–99)

## 2023-09-21 MED ORDER — DOCUSATE SODIUM 100 MG PO CAPS
100.0000 mg | ORAL_CAPSULE | Freq: Every day | ORAL | Status: DC
  Administered 2023-09-22 – 2023-10-04 (×13): 100 mg via ORAL
  Filled 2023-09-21 (×13): qty 1

## 2023-09-21 MED ORDER — POLYETHYLENE GLYCOL 3350 17 G PO PACK
17.0000 g | PACK | Freq: Every day | ORAL | Status: DC | PRN
Start: 2023-09-21 — End: 2023-10-04
  Administered 2023-10-01: 17 g via ORAL
  Filled 2023-09-21: qty 1

## 2023-09-21 MED ORDER — METHOCARBAMOL 500 MG PO TABS: 500.0000 mg | ORAL_TABLET | Freq: Four times a day (QID) | ORAL | Status: DC | PRN

## 2023-09-21 MED ORDER — OXYCODONE HCL 5 MG PO TABS
5.0000 mg | ORAL_TABLET | ORAL | Status: DC | PRN
  Administered 2023-09-23 – 2023-09-26 (×2): 5 mg via ORAL
  Filled 2023-09-21 (×2): qty 1

## 2023-09-21 MED ORDER — ATORVASTATIN CALCIUM 40 MG PO TABS
40.0000 mg | ORAL_TABLET | Freq: Every day | ORAL | Status: DC
  Administered 2023-09-22 – 2023-10-04 (×13): 40 mg via ORAL
  Filled 2023-09-21 (×13): qty 1

## 2023-09-21 MED ORDER — FLEET ENEMA RE ENEM: 1.0000 | ENEMA | Freq: Once | RECTAL | Status: DC | PRN

## 2023-09-21 MED ORDER — ACETAMINOPHEN 325 MG PO TABS
325.0000 mg | ORAL_TABLET | ORAL | Status: DC | PRN
  Administered 2023-09-21 – 2023-09-23 (×3): 650 mg via ORAL
  Administered 2023-09-27: 325 mg via ORAL
  Filled 2023-09-21 (×4): qty 2

## 2023-09-21 MED ORDER — GUAIFENESIN-DM 100-10 MG/5ML PO SYRP
10.0000 mL | ORAL_SOLUTION | Freq: Four times a day (QID) | ORAL | Status: DC | PRN
Start: 2023-09-21 — End: 2023-10-04

## 2023-09-21 MED ORDER — INSULIN ASPART 100 UNIT/ML IJ SOLN
0.0000 [IU] | Freq: Three times a day (TID) | INTRAMUSCULAR | Status: DC
  Administered 2023-09-21: 1 [IU] via SUBCUTANEOUS
  Administered 2023-09-22 (×3): 2 [IU] via SUBCUTANEOUS
  Administered 2023-09-22 – 2023-09-23 (×2): 1 [IU] via SUBCUTANEOUS
  Administered 2023-09-23: 2 [IU] via SUBCUTANEOUS
  Administered 2023-09-24: 3 [IU] via SUBCUTANEOUS
  Administered 2023-09-24: 2 [IU] via SUBCUTANEOUS
  Administered 2023-09-25: 1 [IU] via SUBCUTANEOUS
  Administered 2023-09-25: 2 [IU] via SUBCUTANEOUS
  Administered 2023-09-25: 1 [IU] via SUBCUTANEOUS
  Administered 2023-09-26: 2 [IU] via SUBCUTANEOUS
  Administered 2023-09-27: 1 [IU] via SUBCUTANEOUS
  Administered 2023-09-28: 3 [IU] via SUBCUTANEOUS
  Administered 2023-09-28 – 2023-09-29 (×3): 1 [IU] via SUBCUTANEOUS
  Administered 2023-09-30: 2 [IU] via SUBCUTANEOUS
  Administered 2023-10-01 (×2): 1 [IU] via SUBCUTANEOUS
  Administered 2023-10-02: 3 [IU] via SUBCUTANEOUS
  Administered 2023-10-03: 1 [IU] via SUBCUTANEOUS

## 2023-09-21 MED ORDER — ONDANSETRON HCL 4 MG/2ML IJ SOLN: 4.0000 mg | Freq: Four times a day (QID) | INTRAMUSCULAR | Status: DC | PRN

## 2023-09-21 MED ORDER — ALUM & MAG HYDROXIDE-SIMETH 200-200-20 MG/5ML PO SUSP
30.0000 mL | ORAL | Status: DC | PRN
Start: 2023-09-21 — End: 2023-10-04

## 2023-09-21 MED ORDER — MELATONIN 5 MG PO TABS
5.0000 mg | ORAL_TABLET | Freq: Every evening | ORAL | Status: DC | PRN
  Administered 2023-09-21 – 2023-09-28 (×4): 5 mg via ORAL
  Filled 2023-09-21 (×5): qty 1

## 2023-09-21 MED ORDER — MEMANTINE HCL 10 MG PO TABS
10.0000 mg | ORAL_TABLET | Freq: Two times a day (BID) | ORAL | Status: DC
  Administered 2023-09-21 – 2023-10-04 (×26): 10 mg via ORAL
  Filled 2023-09-21 (×28): qty 1

## 2023-09-21 MED ORDER — BISACODYL 5 MG PO TBEC: 5.0000 mg | DELAYED_RELEASE_TABLET | Freq: Every day | ORAL | Status: DC | PRN

## 2023-09-21 MED ORDER — ONDANSETRON HCL 4 MG PO TABS: 4.0000 mg | ORAL_TABLET | Freq: Four times a day (QID) | ORAL | Status: DC | PRN

## 2023-09-21 MED ORDER — DIPHENHYDRAMINE HCL 25 MG PO CAPS: 25.0000 mg | ORAL_CAPSULE | Freq: Four times a day (QID) | ORAL | Status: DC | PRN

## 2023-09-21 MED ORDER — ASPIRIN 81 MG PO TBEC
81.0000 mg | DELAYED_RELEASE_TABLET | Freq: Every day | ORAL | Status: DC
  Administered 2023-09-22 – 2023-10-04 (×13): 81 mg via ORAL
  Filled 2023-09-21 (×13): qty 1

## 2023-09-21 MED ORDER — PANTOPRAZOLE SODIUM 40 MG PO TBEC
40.0000 mg | DELAYED_RELEASE_TABLET | Freq: Every day | ORAL | Status: DC
  Administered 2023-09-22 – 2023-09-27 (×6): 40 mg via ORAL
  Filled 2023-09-21 (×6): qty 1

## 2023-09-21 NOTE — Progress Notes (Signed)
Inpatient Rehab Admissions Coordinator:    I continue to await insurance auth for CIR. I will follow for potential admit pending approval.   Megan Salon, MS, CCC-SLP Rehab Admissions Coordinator  [phone] (celll) [phone] (office)

## 2023-09-21 NOTE — PMR Pre-admission (Signed)
PMR Admission Coordinator Pre-Admission Assessment  Patient: Walter [name] is an 77 y.o., male MRN: [id number] DOB: 1946/09/04 Height: 5\' 7"  (170.2 cm) Weight: 66.6 kg  Insurance Information HMO:  yes   PPO:      PCP:     IPA:      80/20:      OTHER:  PRIMARYAlinda [name]      Policy#: [id number]      Subscriber: Pt CM Name:Tiffany      Phone#: [phone]     Fax#: [phone] Tiffany with [employer] called with auth on 11/20 for admit 11/20- 16/60 Pre-Cert#: [id number]      Employer:  Benefits:  Phone #: [phone]     Name: Walter [name] Date: 11/01/2022- still active Deductible: does not have OOP Max: $4,500 ($2,454 met) CIR: $295/day co-pay with a max co-pay of $1,770/admission (6 days) SNF: $0/day co-pay for days 1-20, $203/day co-pay for days 21-100; limited to 100 days/cal yr. Outpatient:  $20 copay/visit Home Health:  100% coverage DME: 80% coverage; 20% co-insurance Providers: in network   SECONDARY:       Policy#:      Phone#:   Artist:       Phone#:   The Data processing manager" for patients in Inpatient Rehabilitation Facilities with attached "Privacy Act Statement-Health Care Records" was provided and verbally reviewed with: Patient  Emergency Contact Information Contact Information     Name Relation Home Work Mobile   [place] Spouse [phone]  [phone]   Walter [name] Sister [phone]     Walter [name]   [phone]      Other Contacts   None on File     Current Medical History  Patient Admitting Diagnosis: debility after abdominal aortic aneurysm repair  History of Present Illness: Walter [name] is a 77 y.o. male with a history of an abdominal aortic aneurysm, as well as Parkinson's disease with dementia, diabetes, CAD, hypertension, peripheral artery disease who elected to go repair of said aneurysm and was admitted  to [facility] on 09/16/2023 for the surgery.  Postoperative course  was complicated by acute blood loss anemia requiring 5 units of packed red blood cells and pressors, acute kidney injuries and metabolic acidosis.  Patient has stabilized but is significantly deconditioned and dealing with severe protein calorie malnutrition as well.  Patient lives at home with his wife in a two-level house with one-step to enter.  They are able to live on the main level with the bedroom and bathroom.  Prior to this admission patient used a rollator typically in the morning and for longer distances.  Wife assists him with basic ADLs as far as getting in and out of the bathtub/bathroom.  Otherwise he is reported as being independent.  Pt. Was seen by PT/OT and they recommended CIR to assist return to PLOF.    Patient's medical record from [facility]  has been reviewed by the rehabilitation admission coordinator and physician.  Past Medical History  Past Medical History:  Diagnosis Date   Allergy    Cancer (HCC)    Skin cancer   Cataract    Coronary artery disease    Diabetes mellitus    Diverticulosis    Esophageal stricture    GERD (gastroesophageal reflux disease)    History of hiatal hernia    Hyperlipidemia    Hypertension    Peripheral vascular disease (HCC)     Has the patient had major surgery during 100 days prior  to admission? Yes  Family History   family history includes Breast cancer in his mother; Healthy in his son and son; Parkinson's disease in his sister.  Current Medications  Current Facility-Administered Medications:    0.9 %  sodium chloride infusion, 500 mL, Intravenous, Once PRN, Baglia, Corrina, PA-C   0.9 %  sodium chloride infusion, 500 mL, Intravenous, Once PRN, Baglia, Corrina, PA-C   acetaminophen (TYLENOL) tablet 325-650 mg, 325-650 mg, Oral, Q4H PRN, 650 mg at 09/21/23 0146 **OR** acetaminophen (TYLENOL) suppository 325-650 mg, 325-650 mg, Rectal, Q4H PRN, Baglia, Corrina, PA-C   alum & mag hydroxide-simeth  (MAALOX/MYLANTA) 200-200-20 MG/5ML suspension 15-30 mL, 15-30 mL, Oral, Q2H PRN, Baglia, Corrina, PA-C   aspirin EC tablet 81 mg, 81 mg, Oral, Daily, Baglia, Corrina, PA-C, 81 mg at 09/20/23 0831   atorvastatin (LIPITOR) tablet 40 mg, 40 mg, Oral, Daily, Olalere, Adewale A, MD, 40 mg at 09/20/23 0831   bisacodyl (DULCOLAX) EC tablet 5 mg, 5 mg, Oral, Daily PRN, Baglia, Corrina, PA-C   Chlorhexidine Gluconate Cloth 2 % PADS 6 each, 6 each, Topical, Daily, Maeola Harman, MD, 6 each at 09/20/23 1028   docusate sodium (COLACE) capsule 100 mg, 100 mg, Oral, Daily, Baglia, Corrina, PA-C, 100 mg at 09/17/23 0913   feeding supplement (ENSURE ENLIVE / ENSURE PLUS) liquid 237 mL, 237 mL, Oral, BID BM, Gwenevere Abbot, MD, 237 mL at 09/20/23 1405   guaiFENesin-dextromethorphan (ROBITUSSIN DM) 100-10 MG/5ML syrup 15 mL, 15 mL, Oral, Q4H PRN, Baglia, Corrina, PA-C   hydrALAZINE (APRESOLINE) injection 10 mg, 10 mg, Intravenous, Q2H PRN, Nada Libman, MD, 10 mg at 09/21/23 0403   HYDROmorphone (DILAUDID) injection 0.5-1 mg, 0.5-1 mg, Intravenous, Q2H PRN, Baglia, Corrina, PA-C, 0.5 mg at 09/16/23 2130   insulin aspart (novoLOG) injection 0-9 Units, 0-9 Units, Subcutaneous, TID AC & HS, Maeola Harman, MD, 2 Units at 09/21/23 1914   magnesium sulfate IVPB 2 g 50 mL, 2 g, Intravenous, Daily PRN, Baglia, Corrina, PA-C   memantine (NAMENDA) tablet 10 mg, 10 mg, Oral, BID, Mannam, Praveen, MD, 10 mg at 09/20/23 2125   methocarbamol (ROBAXIN) injection 500 mg, 500 mg, Intravenous, Q6H PRN, Maeola Harman, MD   ondansetron [facility]) injection 4 mg, 4 mg, Intravenous, Q6H PRN, Baglia, Corrina, PA-C, 4 mg at 09/16/23 2127   Oral care mouth rinse, 15 mL, Mouth Rinse, PRN, Maeola Harman, MD   oxyCODONE (Oxy IR/ROXICODONE) immediate release tablet 5-10 mg, 5-10 mg, Oral, Q4H PRN, Baglia, Corrina, PA-C, 5 mg at 09/19/23 2130   pantoprazole (PROTONIX) EC tablet 40 mg, 40 mg, Oral,  Daily, Baglia, Corrina, PA-C, 40 mg at 09/20/23 0831   phenol (CHLORASEPTIC) mouth spray 1 spray, 1 spray, Mouth/Throat, PRN, Baglia, Corrina, PA-C   potassium chloride SA (KLOR-CON M) CR tablet 20-40 mEq, 20-40 mEq, Oral, Daily PRN, Baglia, Corrina, PA-C   senna-docusate (Senokot-S) tablet 1 tablet, 1 tablet, Oral, QHS PRN, Baglia, Corrina, PA-C   sodium chloride flush (NS) 0.9 % injection 10 mL, 10 mL, Intravenous, Q12H, Baglia, Corrina, PA-C, 10 mL at 09/20/23 2126   sodium chloride flush (NS) 0.9 % injection 10 mL, 10 mL, Intravenous, Q12H, Baglia, Corrina, PA-C, 10 mL at 09/20/23 2125  Patients Current Diet:  Diet Order             DIET DYS 3 Room service appropriate? Yes; Fluid consistency: Thin  Diet effective now  Precautions / Restrictions Precautions Precautions: Fall Precaution Comments: Monitor BP Restrictions Weight Bearing Restrictions: No   Has the patient had 2 or more falls or a fall with injury in the past year? No  Prior Activity Level Community (5-7x/wk): Pt. active in the community PTA  Prior Functional Level Self Care: Did the patient need help bathing, dressing, using the toilet or eating? Independent  Indoor Mobility: Did the patient need assistance with walking from room to room (with or without device)? Independent  Stairs: Did the patient need assistance with internal or external stairs (with or without device)? Independent  Functional Cognition: Did the patient need help planning regular tasks such as shopping or remembering to take medications? Needed some help  Patient Information Are you of Hispanic, Latino/a,or Spanish origin?: A. No, not of Hispanic, Latino/a, or Spanish origin What is your race?: A. White Do you need or want an interpreter to communicate with a doctor or health care staff?: 0. No  Patient's Response To:  Health Literacy and Transportation Is the patient able to respond to health literacy and  transportation needs?: Yes Health Literacy - How often do you need to have someone help you when you read instructions, pamphlets, or other written material from your doctor or pharmacy?: Never In the past 12 months, has lack of transportation kept you from medical appointments or from getting medications?: No In the past 12 months, has lack of transportation kept you from meetings, work, or from getting things needed for daily living?: No  Journalist, newspaper / Equipment Home Equipment: Rollator (4 wheels), BSC/3in1, Toilet riser, Agricultural consultant (2 wheels), Grab bars - toilet, Grab bars - tub/shower, Shower seat  Prior Device Use: Indicate devices/aids used by the patient prior to current illness, exacerbation or injury? None of the above  Current Functional Level Cognition  Overall Cognitive Status: History of cognitive impairments - at baseline Orientation Level: Oriented X4 General Comments: slow response to questions and to commands with motor output    Extremity Assessment (includes Sensation/Coordination)  Upper Extremity Assessment: Generalized weakness, RUE deficits/detail, LUE deficits/detail (edema in hands) RUE Coordination: decreased fine motor, decreased gross motor LUE Coordination: decreased fine motor, decreased gross motor  Lower Extremity Assessment: Defer to PT evaluation    ADLs  Overall ADL's : Needs assistance/impaired Eating/Feeding: Set up, Sitting Grooming: Wash/dry hands, Wash/dry face, Sitting, Moderate assistance Grooming Details (indicate cue type and reason): difficulty with hand to face Upper Body Bathing: Maximal assistance, Sitting Lower Body Bathing: +2 for physical assistance, Total assistance, Sit to/from stand Upper Body Dressing : Maximal assistance, Sitting Lower Body Dressing: Total assistance, +2 for physical assistance, Sit to/from stand Toileting- Architect and Hygiene: Total assistance, +2 for physical assistance, Sit to/from  stand    Mobility  Overal bed mobility: Needs Assistance Bed Mobility: Supine to Sit Rolling: Min assist, Used rails Sidelying to sit: Mod assist, Used rails, HOB elevated Supine to sit: Mod assist Sit to sidelying: Min assist General bed mobility comments: in chair    Transfers  Overall transfer level: Needs assistance Equipment used: Rolling walker (2 wheels) Transfers: Sit to/from Stand Sit to Stand: Mod assist Bed to/from chair/wheelchair/BSC transfer type:: Step pivot Step pivot transfers: Mod assist General transfer comment: assist to rise and transfer weight anterior, slow to rise    Ambulation / Gait / Stairs / Wheelchair Mobility  Ambulation/Gait General Gait Details: Deferred to low BP with mobility.    Posture / Balance Dynamic Sitting Balance Sitting balance -  Comments: CGA Balance Overall balance assessment: Needs assistance Sitting-balance support: Feet supported, Single extremity supported Sitting balance-Leahy [name]: Poor Sitting balance - Comments: CGA Postural control: Right lateral lean Standing balance support: Bilateral upper extremity supported Standing balance-Leahy [name]: Poor    Special needs/care consideration Skin   and Special service needs     Previous Home Environment (from acute therapy documentation) Living Arrangements: Spouse/significant other  Lives With: Spouse Available Help at Discharge: Family, Available PRN/intermittently Type of Home: House Home Layout: Two level, Able to live on main level with bedroom/bathroom Home Access: Stairs to enter Entrance Stairs-Rails: None Entrance Stairs-Number of Steps: 1 Bathroom Shower/Tub: Armed forces operational officer Accessibility: Yes  Discharge Living Setting Plans for Discharge Living Setting: Patient's home Type of Home at Discharge: House Discharge Home Layout: Two level, Able to live on main level with bedroom/bathroom Alternate Level Stairs-Rails:  Right Alternate Level Stairs-Number of Steps: flight Discharge Home Access: Stairs to enter Entrance Stairs-Rails: None Entrance Stairs-Number of Steps: 1 Discharge Bathroom Shower/Tub: Tub/shower unit Discharge Bathroom Toilet: Standard Discharge Bathroom Accessibility: Yes How Accessible: Accessible via walker, Accessible via wheelchair  Social/Family/Support Systems Patient Roles: Spouse Contact Information: [phone] Anticipated Caregiver: Rhunette Croft Ability/Limitations of Caregiver: Min A Caregiver Availability: 24/7 Discharge Plan Discussed with Primary Caregiver: Yes Is Caregiver In Agreement with Plan?: Yes Does Caregiver/Family have Issues with Lodging/Transportation while Pt is in Rehab?: Yes  Goals Patient/Family Goal for Rehab: PT/OT Supervision Expected length of stay: 16-18 days Pt/Family Agrees to Admission and willing to participate: Yes Program Orientation Provided & Reviewed with Pt/Caregiver Including Roles  & Responsibilities: Yes  Decrease burden of Care through IP rehab admission: not anticipated  Possible need for SNF placement upon discharge: not anticipated  Patient Condition: I have reviewed medical records from [facility], spoken with CM, and patient and spouse. I met with patient at the bedside for inpatient rehabilitation assessment.  Patient will benefit from ongoing PT and OT, can actively participate in 3 hours of therapy a day 5 days of the week, and can make measurable gains during the admission.  Patient will also benefit from the coordinated team approach during an Inpatient Acute Rehabilitation admission.  The patient will receive intensive therapy as well as Rehabilitation physician, nursing, social worker, and care management interventions.  Due to safety, skin/wound care, disease management, medication administration, pain management, and patient education the patient requires 24 hour a day rehabilitation nursing.  The patient is  currently mod A-max A with mobility and basic ADLs.  Discharge setting and therapy post discharge at home with home health is anticipated.  Patient has agreed to participate in the Acute Inpatient Rehabilitation Program and will admit today.  Preadmission Screen Completed By:  Jeronimo Greaves, 09/21/2023 8:47 AM ______________________________________________________________________   Discussed status with Dr. Carlis Abbott  on 09/21/23 at 930 and received approval for admission today.  Admission Coordinator:  Jeronimo Greaves, CCC-SLP, time 1430/Date 09/21/23   Assessment/Plan: Diagnosis: Debility 2/2 AAA repair Does the need for close, 24 hr/day Medical supervision in concert with the patient's rehab needs make it unreasonable for this patient to be served in a less intensive setting? Yes Co-Morbidities requiring supervision/potential complications:  Due to bladder management, bowel management, safety, skin/wound care, disease management, medication administration, pain management, and patient education, does the patient require 24 hr/day rehab nursing? Yes Does the patient require coordinated care of a physician, rehab nurse, PT, OT to address physical and functional deficits in the context of the  above medical diagnosis(es)? Yes Addressing deficits in the following areas: balance, endurance, locomotion, strength, transferring, bowel/bladder control, bathing, dressing, feeding, grooming, and toileting Can the patient actively participate in an intensive therapy program of at least 3 hrs of therapy 5 days a week? Yes The potential for patient to make measurable gains while on inpatient rehab is excellent Anticipated functional outcomes upon discharge from inpatient rehab: supervision PT, supervision OT, supervision SLP Estimated rehab length of stay to reach the above functional goals is: 10-14 days Anticipated discharge destination: Home 10. Overall Rehab/Functional Prognosis: excellent   MD  Signature: Sula Soda, MD

## 2023-09-21 NOTE — Progress Notes (Signed)
Report called to Sabino Gasser for 4-MW-11. Patient to be transferred for rehab.

## 2023-09-21 NOTE — Progress Notes (Signed)
Inpatient Rehab Admissions Coordinator:    I have insurance auth and a CIR bed for this Pt. Today. RN may call report to [phone].  Pt. Will admit to CIR for an estimated 12-14 days with the goal of discharging home with his wife.   Megan Salon, MS, CCC-SLP Rehab Admissions Coordinator  [phone] (celll) [phone] (office)

## 2023-09-21 NOTE — Care Management Important Message (Signed)
Important Message  Patient Details  Name: Walter [name] MRN: [id number] Date of Birth: 1946-05-22   Important Message Given:  Yes - Medicare IM     Dorena Bodo 09/21/2023, 2:27 PM

## 2023-09-21 NOTE — H&P (Signed)
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  He was severely deconditioned and also had concomitant pulmonary embolus and ultimately underwent IVC filter placement due to intolerance of Eliquis with GI bleeding.  He underwent preoperative cardiac clearance with stress testing and was moderate risk for general anesthesia.  On 09/16/2023 he was taken to the operating room and underwent endovascular repair of abdominal aortic aneurysm with left common iliac to left common femoral artery bypass and stent placement to left external iliac artery.  He was transfused 3 units of packed red blood cells for surgical losses.  He was transferred to intensive care on peripheral vasopressors, acute kidney injury and acidosis.  He was transfused additional unit of packed red blood cells.  IV fluid resuscitation continued.  Sliding scale insulin coverage for prediabetes.  He has a history of dementia on Namenda. Weaned off vasopressors and hemoglobin stabilized.  He was transferred to the stepdown unit on 11/18.  Serum creatinine continues downward trend.  Tolerating diet.The patient requires inpatient medicine and rehabilitation evaluations and services for ongoing dysfunction secondary to debility. Currently hypertensive.   ROS Denies pain Past Medical History:  Diagnosis Date   Allergy    Cancer (HCC)    Skin cancer   Cataract    Coronary artery disease    Diabetes mellitus    Diverticulosis    Esophageal stricture    GERD (gastroesophageal reflux disease)    History of hiatal hernia    Hyperlipidemia    Hypertension    Peripheral vascular disease (HCC)    Past Surgical History:  Procedure Laterality Date   ABDOMINAL AORTIC ENDOVASCULAR STENT GRAFT N/A 09/16/2023   Procedure:  ABDOMINAL AORTA ANEURYSM STENT USING COOK 32 X STENT AND ENDO-ANCHORS X 7;  Surgeon: Maeola Harman, MD;  Location: [facility] OR;  Service: Vascular;  Laterality: N/A;   ANEURYSM COILING Right 09/16/2023   Procedure: COILING TO RIGHT COMMON ILIAC;  Surgeon: Maeola Harman, MD;  Location: [facility] OR;  Service: Vascular;  Laterality: Right;   BIOPSY  03/07/2023   Procedure: BIOPSY;  Surgeon: Beverley Fiedler, MD;  Location: [facility] ENDOSCOPY;  Service: Gastroenterology;;   BYPASS GRAFT FEMORAL-PERONEAL Left 09/16/2023   Procedure: LEFT EXTERNAL ILIAC TO COMMON FEMORAL BYPASS USING HEMASHIELD GOLD GRAFT;  Surgeon: Maeola Harman, MD;  Location: [facility] OR;  Service: Vascular;  Laterality: Left;   CATARACT EXTRACTION Bilateral    COLONOSCOPY WITH PROPOFOL N/A 03/07/2023   Procedure: COLONOSCOPY WITH PROPOFOL;  Surgeon: Beverley Fiedler, MD;  Location: [facility] ENDOSCOPY;  Service: Gastroenterology;  Laterality: N/A;   CORONARY ANGIOPLASTY WITH STENT PLACEMENT  2004   ENDARTERECTOMY Left 09/16/2023   Procedure: LEFT ILIAC ENDARTERECTOMY;  Surgeon: Maeola Harman, MD;  Location: [facility] OR;  Service: Vascular;  Laterality: Left;   ENDARTERECTOMY FEMORAL Bilateral 09/16/2023   Procedure: BILATERAL FEMORAL ENDARTERECTOMY;  Surgeon: Maeola Harman, MD;  Location: [facility] OR;  Service: Vascular;  Laterality: Bilateral;   ESOPHAGOGASTRODUODENOSCOPY  06/07/2012   Procedure: ESOPHAGOGASTRODUODENOSCOPY (EGD);  Surgeon: Hart Carwin, MD;  Location: [facility] ENDOSCOPY;  Service: Endoscopy;  Laterality: N/A;   ESOPHAGOGASTRODUODENOSCOPY (EGD) WITH PROPOFOL N/A 03/07/2023   Procedure: ESOPHAGOGASTRODUODENOSCOPY (EGD) WITH PROPOFOL;  Surgeon: Beverley Fiedler, MD;  Location: [facility] ENDOSCOPY;  Service: Gastroenterology;  Laterality: N/A;  FEMORAL-FEMORAL BYPASS GRAFT Bilateral 09/16/2023   Procedure: LEFT TO RIGHT BYPASS GRAFT FEMORAL-FEMORAL ARTERY USING HEMSHIELD GOLD GRAFT;  Surgeon: Maeola Harman, MD;  Location: [facility] OR;  Service: Vascular;  Laterality: Bilateral;   HOT HEMOSTASIS N/A 03/07/2023   Procedure: HOT HEMOSTASIS (ARGON PLASMA COAGULATION/BICAP);  Surgeon: Beverley Fiedler, MD;  Location: [facility] ENDOSCOPY;  Service: Gastroenterology;  Laterality: N/A;   IR KYPHO LUMBAR INC FX REDUCE BONE BX UNI/BIL CANNULATION INC/IMAGING  03/02/2023   IVC FILTER INSERTION N/A 03/09/2023   Procedure: IVC FILTER INSERTION;  Surgeon: Victorino Sparrow, MD;  Location: [facility] INVASIVE CV LAB;  Service: Cardiovascular;  Laterality: N/A;   POLYPECTOMY  03/07/2023   Procedure: POLYPECTOMY;  Surgeon: Beverley Fiedler, MD;  Location: [facility] ENDOSCOPY;  Service: Gastroenterology;;   SHOULDER SURGERY  11/01/2004   Family History  Problem Relation Age of Onset   Breast cancer Mother    Healthy Son    Healthy Son    Parkinson's disease Sister    Colon cancer Neg Hx    Esophageal cancer Neg Hx    Rectal cancer Neg Hx    Stomach cancer Neg Hx    Social History:  reports that he quit smoking about 23 years ago. His smoking use included cigarettes. He has never used smokeless tobacco. He reports that he does not drink alcohol and does not use drugs. Allergies: No Known Allergies Medications Prior to Admission  Medication Sig Dispense Refill   amLODipine (NORVASC) 5 MG tablet Take 1 tablet by mouth once daily 90 tablet 1   aspirin EC 81 MG tablet Take 1 tablet (81 mg total) by mouth daily. Swallow whole.     atorvastatin (LIPITOR) 40 MG tablet Take 1 tablet by mouth once daily 90 tablet 0   Blood Glucose Monitoring Suppl (ONE TOUCH ULTRA 2) w/Device KIT 1 kit by Does not apply route daily. 1 kit 0   cyanocobalamin 2000 MCG tablet Take 1 tablet (2,000 mcg total) by mouth daily. 30 tablet 0   Ferrous Sulfate (IRON) 325 (65 Fe) MG TABS Take 1 tablet by mouth once daily 30 tablet 0   Lancets (ONETOUCH ULTRASOFT) lancets Check blood sugars once daily. Dx: E11.51 100 each 12   loratadine (ALLERGY RELIEF) 10 MG  tablet Take 10 mg by mouth daily.     losartan (COZAAR) 100 MG tablet Take 1 tablet by mouth once daily 90 tablet 0   memantine (NAMENDA) 10 MG tablet Take 1 tablet twice a day 180 tablet 3   metFORMIN (GLUCOPHAGE) 1000 MG tablet TAKE 1 TABLET BY MOUTH TWICE DAILY WITH A MEAL 180 tablet 2   Multiple Vitamin (MULTIVITAMIN WITH MINERALS) TABS tablet Take 1 tablet by mouth daily.     ONETOUCH ULTRA test strip USE 1 STRIP TO CHECK GLUCOSE ONCE DAILY 100 each 2   pantoprazole (PROTONIX) 40 MG tablet Take 1 tablet (40 mg total) by mouth daily.      Home: Home Living Family/patient expects to be discharged to:: Private residence Living Arrangements: Spouse/significant other Available Help at Discharge: Family, Available PRN/intermittently Type of Home: House Home Access: Stairs to enter Secretary/administrator of Steps: 1 Entrance Stairs-Rails: None Home Layout: Two level, Able to live on main level with bedroom/bathroom Bathroom Shower/Tub: Engineer, manufacturing systems: Standard Bathroom Accessibility: Yes Home Equipment: Rollator (4 wheels), BSC/3in1, Toilet riser, Rolling Walker (2 wheels), Grab bars - toilet, Grab bars - tub/shower, Shower seat  Lives With: Spouse   Functional History: Prior  Function Prior Level of Function : Needs assist Mobility Comments: Uses rollator in the morning when he first wakes up, or if he has to walk to the bathroom at night ADLs Comments: Wife assisted with pt getting in and out of the tub and IADLs, pt otherwise independent   Functional Status:  Mobility: Bed Mobility Overal bed mobility: Needs Assistance Bed Mobility: Supine to Sit Rolling: Min assist, Used rails Sidelying to sit: Mod assist, Used rails, HOB elevated Supine to sit: Min assist, HOB elevated, Used rails Sit to sidelying: Min assist General bed mobility comments: seated EOB upon arrival Transfers Overall transfer level: Needs assistance Equipment used: Rolling walker (2  wheels) Transfers: Sit to/from Stand Sit to Stand: Min assist Bed to/from chair/wheelchair/BSC transfer type:: Step pivot Step pivot transfers: Mod assist General transfer comment: Light min assist for boost and balance (posterior lean) when rising from EOB. Cues for RW placement and forward weight shift to stabilize. CGA from recliner with cues for hand placement on arm rests of chair when descending, practiced x2 with good recall. Ambulation/Gait Ambulation/Gait assistance: Min assist Gait Distance (Feet): 30 Feet Assistive device: Rolling walker (2 wheels) Gait Pattern/deviations: Step-through pattern, Decreased stride length, Knees buckling, Decreased dorsiflexion - right, Decreased dorsiflexion - left, Knee flexed in stance - right, Knee flexed in stance - left, Shuffle, Festinating, Trunk flexed, Narrow base of support General Gait Details: VC for larger step length and tempo, upright posture, and forward gaze intermittently. Min assist for balance and with instability while backing up towards chair to correct LOB. Educated on awareness. Minor buckling of knees but improves when he stands upright which is sustained for short periods of time. He is able to self correct knee buckling with RW support. Gait velocity: dec Gait velocity interpretation: <1.31 ft/sec, indicative of household ambulator   ADL: ADL Overall ADL's : Needs assistance/impaired Eating/Feeding: Set up, Sitting Grooming: Wash/dry hands, Wash/dry face, Oral care, Brushing hair, Sitting, Set up (shampoo cap) Grooming Details (indicate cue type and reason): difficulty with hand to face Upper Body Bathing: Maximal assistance, Sitting Lower Body Bathing: +2 for physical assistance, Total assistance, Sit to/from stand Upper Body Dressing : Minimal assistance, Sitting Upper Body Dressing Details (indicate cue type and reason): front opening gown Lower Body Dressing: Total assistance, +2 for physical assistance, Sit to/from  stand Toileting- Architect and Hygiene: Total assistance, +2 for physical assistance, Sit to/from stand Functional mobility during ADLs: Minimal assistance, Rolling walker (2 wheels)   Cognition: Cognition Overall Cognitive Status: History of cognitive impairments - at baseline Orientation Level: Oriented X4 Cognition Arousal: Alert Behavior During Therapy: WFL for tasks assessed/performed Overall Cognitive Status: History of cognitive impairments - at baseline General Comments: much more participatory today    Physical Exam: Blood pressure (!) 145/60, pulse 86, temperature 98.7 F (37.1 C), temperature source Oral, resp. rate 18, SpO2 96%. Physical Exam Gen: no distress, normal appearing HEENT: oral mucosa pink and moist, NCAT Cardio: Reg rate Chest: normal effort, normal rate of breathing Abd: soft, non-distended Ext: no edema Psych: pleasant, normal affect Skin: abdomen and groin incisions c/d/i Neuro: Alert and oriented x3, 5/5 strength upper extremities and 4/5 in lower extremities  Results for orders placed or performed during the hospital encounter of 09/16/23 (from the past 48 hour(s))  Glucose, capillary     Status: Abnormal   Collection Time: 09/19/23  9:21 PM  Result Value Ref Range   Glucose-Capillary 212 (H) 70 - 99 mg/dL    Comment: Glucose  reference range applies only to samples taken after fasting for at least 8 hours.  Glucose, capillary     Status: Abnormal   Collection Time: 09/20/23  8:41 AM  Result Value Ref Range   Glucose-Capillary 175 (H) 70 - 99 mg/dL    Comment: Glucose reference range applies only to samples taken after fasting for at least 8 hours.  CBC     Status: Abnormal   Collection Time: 09/20/23 10:01 AM  Result Value Ref Range   WBC 9.8 4.0 - 10.5 K/uL   RBC 2.92 (L) 4.22 - 5.81 MIL/uL   Hemoglobin 8.8 (L) 13.0 - 17.0 g/dL   HCT 16.1 (L) 09.6 - 04.5 %   MCV 91.4 80.0 - 100.0 fL   MCH 30.1 26.0 - 34.0 pg   MCHC 33.0 30.0 -  36.0 g/dL   RDW 40.9 81.1 - 91.4 %   Platelets 86 (L) 150 - 400 K/uL    Comment: Immature Platelet Fraction may be clinically indicated, consider ordering this additional test [id number] CONSISTENT WITH PREVIOUS RESULT REPEATED TO VERIFY    nRBC 0.0 0.0 - 0.2 %    Comment: Performed at [facility], [address] [place], [place] [address]  Basic metabolic panel     Status: Abnormal   Collection Time: 09/20/23 10:01 AM  Result Value Ref Range   Sodium 138 135 - 145 mmol/L   Potassium 4.0 3.5 - 5.1 mmol/L   Chloride 108 98 - 111 mmol/L   CO2 21 (L) 22 - 32 mmol/L   Glucose, Bld 189 (H) 70 - 99 mg/dL    Comment: Glucose reference range applies only to samples taken after fasting for at least 8 hours.   BUN 26 (H) 8 - 23 mg/dL   Creatinine, Ser 7.84 (H) 0.61 - 1.24 mg/dL   Calcium 7.9 (L) 8.9 - 10.3 mg/dL   GFR, Estimated 57 (L) >60 mL/min    Comment: (NOTE) Calculated using the CKD-EPI Creatinine Equation (2021)    Anion gap 9 5 - 15    Comment: Performed at [facility], [address] [place], [place] [address]  Glucose, capillary     Status: Abnormal   Collection Time: 09/20/23 11:35 AM  Result Value Ref Range   Glucose-Capillary 158 (H) 70 - 99 mg/dL    Comment: Glucose reference range applies only to samples taken after fasting for at least 8 hours.  Glucose, capillary     Status: Abnormal   Collection Time: 09/20/23  4:28 PM  Result Value Ref Range   Glucose-Capillary 181 (H) 70 - 99 mg/dL    Comment: Glucose reference range applies only to samples taken after fasting for at least 8 hours.  Glucose, capillary     Status: Abnormal   Collection Time: 09/20/23  9:20 PM  Result Value Ref Range   Glucose-Capillary 191 (H) 70 - 99 mg/dL    Comment: Glucose reference range applies only to samples taken after fasting for at least 8 hours.  Glucose, capillary     Status: Abnormal   Collection Time: 09/21/23  7:46 AM  Result Value Ref Range   Glucose-Capillary  129 (H) 70 - 99 mg/dL    Comment: Glucose reference range applies only to samples taken after fasting for at least 8 hours.  Glucose, capillary     Status: Abnormal   Collection Time: 09/21/23  8:19 AM  Result Value Ref Range   Glucose-Capillary 180 (H) 70 - 99 mg/dL  Comment: Glucose reference range applies only to samples taken after fasting for at least 8 hours.  Glucose, capillary     Status: Abnormal   Collection Time: 09/21/23 12:12 PM  Result Value Ref Range   Glucose-Capillary 274 (H) 70 - 99 mg/dL    Comment: Glucose reference range applies only to samples taken after fasting for at least 8 hours.  Glucose, capillary     Status: Abnormal   Collection Time: 09/21/23  4:54 PM  Result Value Ref Range   Glucose-Capillary 132 (H) 70 - 99 mg/dL    Comment: Glucose reference range applies only to samples taken after fasting for at least 8 hours.   No results found.    Blood pressure (!) 145/60, pulse 86, temperature 98.7 F (37.1 C), temperature source Oral, resp. rate 18, SpO2 96%.  Medical Problem List and Plan: Debility following AAA repair  -patient may shower  -ELOS/Goals: S 10-14 days  Admit to CIR  2.  Antithrombotics: -DVT/anticoagulation:  Mechanical: Sequential compression devices, below knee Bilateral lower extremities  -antiplatelet therapy: Aspirin 81 mg daily  3. Pain Management: Tylenol as needed  4. Mood/Behavior/Sleep: LCSW to evaluate and provide emotional support  -antipsychotic agents: n/a  5. Neuropsych/cognition: This patient is not capable of making decisions on his own behalf.  6. Skin/Wound Care: Routine skin care checks   7. Fluids/Electrolytes/Nutrition: Routine Is and Os and follow-up chemistries  -dysphagia 3 diet with thin liquids/carb modified  8: Hypertension: monitor TID and prn  -home losartan 100 mg daily and amlodipine 5 mg daily held  9: AAA s/p endovascular repair with left common iliac to left common femoral artery bypass  and stent of left external iliac artery/15 by Dr. Randie Heinz.  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  Additionally, I have reviewed and concur with the physician assistant's documentation above.   Horton Chin, MD 09/21/2023

## 2023-09-21 NOTE — Progress Notes (Signed)
Mobility Specialist Progress Note:   09/21/23 1053  Mobility  Activity Stood at bedside  Level of Assistance Contact guard assist, steadying assist  Assistive Device Front wheel walker  Activity Response Tolerated well  Mobility Referral Yes  $Mobility charge 1 Mobility  Mobility Specialist Start Time (ACUTE ONLY) 1040  Mobility Specialist Stop Time (ACUTE ONLY) 1050  Mobility Specialist Time Calculation (min) (ACUTE ONLY) 10 min   Pt received in chair, originally requested assistance to BR but stated that he already went in chair. Assisted RN with gown, pericare, and bed pad change. Pt asx throughout. Pt left in chair with call bell in reach and all needs met.   Leory Plowman  Mobility Specialist Please contact via [employer] office at [phone]

## 2023-09-21 NOTE — Progress Notes (Addendum)
  Progress Note    09/21/2023 9:11 AM 5 Days Post-Op  Subjective:  no complaints this morning. Denies any pain    Vitals:   09/21/23 0744 09/21/23 0800  BP:  (!) 143/61  Pulse:  100  Resp:  19  Temp: 98.8 F (37.1 C)   SpO2:  94%    Physical Exam: General:  laying comfortably in bed Cardiac:  regular Lungs:  nonlabored Incisions:  abdominal incision and bilateral groin incisions are soft, intact and dry  Extremities:  BLE well perfused with palpable DP pulses, R>L Abdomen:  soft, nondistended, nontender  CBC    Component Value Date/Time   WBC 9.8 09/20/2023 1001   RBC 2.92 (L) 09/20/2023 1001   HGB 8.8 (L) 09/20/2023 1001   HGB 11.3 (L) 04/18/2023 0816   HCT 26.7 (L) 09/20/2023 1001   HCT 36.1 (L) 04/18/2023 0816   PLT 86 (L) 09/20/2023 1001   PLT 227 04/18/2023 0816   MCV 91.4 09/20/2023 1001   MCV 86 04/18/2023 0816   MCH 30.1 09/20/2023 1001   MCHC 33.0 09/20/2023 1001   RDW 14.4 09/20/2023 1001   RDW 18.1 (H) 04/18/2023 0816   LYMPHSABS 1.5 05/30/2023 1420   LYMPHSABS 1.2 04/18/2023 0816   MONOABS 0.4 05/30/2023 1420   EOSABS 0.2 05/30/2023 1420   EOSABS 0.2 04/18/2023 0816   BASOSABS 0.0 05/30/2023 1420   BASOSABS 0.0 04/18/2023 0816    BMET    Component Value Date/Time   NA 138 09/20/2023 1001   K 4.0 09/20/2023 1001   CL 108 09/20/2023 1001   CO2 21 (L) 09/20/2023 1001   GLUCOSE 189 (H) 09/20/2023 1001   GLUCOSE 127 (H) 08/17/2006 1059   BUN 26 (H) 09/20/2023 1001   CREATININE 1.29 (H) 09/20/2023 1001   CALCIUM 7.9 (L) 09/20/2023 1001   GFRNONAA 57 (L) 09/20/2023 1001   GFRAA >60 01/07/2019 0751    INR    Component Value Date/Time   INR 1.4 (H) 09/16/2023 1620     Intake/Output Summary (Last 24 hours) at 09/21/2023 0911 Last data filed at 09/21/2023 0600 Gross per 24 hour  Intake 840 ml  Output 1600 ml  Net -760 ml      Assessment/Plan:  77 y.o. male is 5 days post op, s/p: aorto-uni iliac aneurysm repair with left common  iliac to common femoral bypass and left to right fem fem   -Feeling great this morning. Denies any abdominal or lower extremity pain -Abdominal incision well appearing and without hematoma. Abdomen is soft and nontender -Bilateral groin incisions intact and dry without hematoma -BLE warm and well perfused with palpable DP pulses, R>L -Continue OOB as tolerated with PT/OT. Pending CIR admission -Continue ASA and statin. Transfer to 4E when bed available   Walter Dubonnet, PA-C [facility] [phone] 09/21/2023 9:11 AM  I have independently interviewed and examined patient and agree with PA assessment and plan above.  Bilateral dorsalis pedis pulses are palpable his abdomen is soft without any evidence of ongoing hematoma.  Patient will be best served with rehab but this was discussed with the wife today who had multiple questions and concerns regarding his care.  Overall I believe he is progressing very well and hopefully can move to CIR in the coming days.  Walter Seelbach C. Randie Heinz, MD [facility] [place] Office: [phone] Pager: [phone]

## 2023-09-21 NOTE — Care Management Important Message (Signed)
Important Message  Patient Details  Name: Walter [name] MRN: [id number] Date of Birth: 1946/06/11   Important Message Given:  Yes - Medicare IM     Sherilyn Banker 09/21/2023, 3:50 PM

## 2023-09-21 NOTE — Progress Notes (Signed)
Occupational Therapy Treatment Patient Details Name: Walter [name] MRN: [id number] DOB: [birth year] Today's Date: 09/21/2023   History of present illness 77 y.o. male admitted 11/15 for elective repair of abdominal aortic aneurysm. Complicated surgery with blood loss, admitted to the ICU on peripheral pressors, AKI and acidosis. PMHx: abdominal aortic aneurysm, L1 compression fx, coronary artery disease, peripheral artery disease, hyperlipidemia, Parkinson's with dementia, PE/DVT status post IVC filter, Cancer, Cataract, Diabetes mellitus, Diverticulosis, Esophageal stricture, GERD, hiatal hernia, Hypertension, and Peripheral vascular disease.   OT comments  Pt is progressing steadily as pain decreases. Ambulated in room with up to min assist and RW. UB dressing requires min assist. Pt completed seated grooming with set up. Remained up in chair at end of session with needs met. Pt with excellent potential to return to baseline. Patient will benefit from intensive inpatient follow up therapy, >3 hours/day.       If plan is discharge home, recommend the following:  Two people to help with bathing/dressing/bathroom;Assistance with cooking/housework;Assistance with feeding;Direct supervision/assist for medications management;Direct supervision/assist for financial management;Assist for transportation;Help with stairs or ramp for entrance;A little help with walking and/or transfers;A lot of help with bathing/dressing/bathroom   Equipment Recommendations  None recommended by OT    Recommendations for Other Services      Precautions / Restrictions Precautions Precautions: Fall Precaution Comments: Monitor BP Restrictions Weight Bearing Restrictions: No       Mobility Bed Mobility               General bed mobility comments: seated EOB upon arrival    Transfers Overall transfer level: Needs assistance Equipment used: Rolling walker (2 wheels) Transfers: Sit to/from Stand Sit  to Stand: Min assist           General transfer comment: Light min assist for boost and balance (posterior lean) when rising from EOB. Cues for RW placement and forward weight shift to stabilize. CGA from recliner with cues for hand placement on arm rests of chair when descending, practiced x2 with good recall.     Balance Overall balance assessment: Needs assistance   Sitting balance-Leahy Scale: Poor Sitting balance - Comments: Intermittent min assist, posterior lean Postural control: Posterior lean Standing balance support: Bilateral upper extremity supported, Reliant on assistive device for balance Standing balance-Leahy Scale: Poor                             ADL either performed or assessed with clinical judgement   ADL Overall ADL's : Needs assistance/impaired     Grooming: Wash/dry hands;Wash/dry face;Oral care;Brushing hair;Sitting;Set up (shampoo cap)           Upper Body Dressing : Minimal assistance;Sitting Upper Body Dressing Details (indicate cue type and reason): front opening gown                 Functional mobility during ADLs: Minimal assistance;Rolling walker (2 wheels)      Extremity/Trunk Assessment              Vision       Perception     Praxis      Cognition Arousal: Alert Behavior During Therapy: WFL for tasks assessed/performed Overall Cognitive Status: History of cognitive impairments - at baseline                                 General Comments: much more  participatory today        Exercises      Shoulder Instructions       General Comments HR to 122 while ambulating, REsting at 102. SpO2 97% on RA. BP 140/59.    Pertinent Vitals/ Pain       Pain Assessment Pain Assessment: No/denies pain  Home Living                                          Prior Functioning/Environment              Frequency  Min 1X/week        Progress Toward Goals  OT  Goals(current goals can now be found in the care plan section)  Progress towards OT goals: Progressing toward goals  Acute Rehab OT Goals OT Goal Formulation: With patient/family Time For Goal Achievement: 10/03/23 Potential to Achieve Goals: Good  Plan      Co-evaluation      Reason for Co-Treatment: To address functional/ADL transfers;For patient/therapist safety PT goals addressed during session: Mobility/safety with mobility;Balance;Proper use of DME        AM-PAC OT "6 Clicks" Daily Activity     Outcome Measure   Help from another person eating meals?: None Help from another person taking care of personal grooming?: A Little Help from another person toileting, which includes using toliet, bedpan, or urinal?: Total Help from another person bathing (including washing, rinsing, drying)?: A Lot Help from another person to put on and taking off regular upper body clothing?: A Little Help from another person to put on and taking off regular lower body clothing?: Total 6 Click Score: 14    End of Session Equipment Utilized During Treatment: Gait belt;Rolling walker (2 wheels)  OT Visit Diagnosis: Muscle weakness (generalized) (M62.81);Pain;Unsteadiness on feet (R26.81);Other abnormalities of gait and mobility (R26.89);Other symptoms and signs involving cognitive function   Activity Tolerance Patient tolerated treatment well   Patient Left in chair;with call bell/phone within reach   Nurse Communication Mobility status        Time: 6213-0865 OT Time Calculation (min): 33 min  Charges: OT General Charges $OT Visit: 1 Visit OT Treatments $Self Care/Home Management : 8-22 mins  Berna Spare, OTR/L Acute Rehabilitation Services Office: [phone]   Evern Bio 09/21/2023, 12:09 PM

## 2023-09-21 NOTE — TOC Transition Note (Signed)
Transition of Care (TOC) - CM/SW Discharge Note Donn Pierini RN, BSN Transitions of Care Unit 4E- RN Case Manager See Treatment Team for direct phone #   Patient Details  Name: Walter [name] MRN: [id number] Date of Birth: [birth year]  Transition of Care [facility]) CM/SW Contact:  Darrold Span, RN Phone Number: 09/21/2023, 4:13 PM   Clinical Narrative:    Pt received from 75M in Transfer this am, [facility] had started auth for possible admit.  Note that [facility] has received auth and per liaison they have bed available to admit today.  Pt stable for transition to [facility].   CM had received notice from [facility] liaison that [facility] office sent office referral to them for [facility] needs- liaison has been updated on transition to [facility] and will follow for [name] needs post rehab stay.    Final next level of care: IP Rehab Facility Barriers to Discharge: No Barriers Identified   Patient Goals and CMS Choice   Choice offered to / list presented to : Patient  Discharge Placement                   [facility]      Discharge Plan and Services Additional resources added to the After Visit Summary for       Post Acute Care Choice: IP Rehab                               Social Determinants of Health (SDOH) Interventions SDOH Screenings   Food Insecurity: No Food Insecurity (09/21/2023)  Housing: Low Risk  (04/27/2023)  Transportation Needs: No Transportation Needs (09/21/2023)  Utilities: Not At Risk (09/21/2023)  Alcohol Screen: Low Risk  (04/27/2023)  Depression (PHQ2-9): Low Risk  (04/27/2023)  Recent Concern: Depression (PHQ2-9) - High Risk (02/16/2023)  Financial Resource Strain: Low Risk  (04/27/2023)  Physical Activity: Insufficiently Active (04/27/2023)  Social Connections: Socially Integrated (04/27/2023)  Stress: No Stress Concern Present (04/27/2023)  Tobacco Use: Medium Risk (09/16/2023)     Readmission Risk Interventions     No data to display

## 2023-09-21 NOTE — Progress Notes (Signed)
Physical Therapy Treatment Patient Details Name: Walter [name] MRN: [id number] DOB: 15-Mar-1946 Today's Date: 09/21/2023   History of Present Illness 77 y.o. male admitted 11/15 for elective repair of abdominal aortic aneurysm. Complicated surgery with blood loss, admitted to the ICU on peripheral pressors, AKI and acidosis. PMHx: abdominal aortic aneurysm, L1 compression fx, coronary artery disease, peripheral artery disease, hyperlipidemia, Parkinson's with dementia, PE/DVT status post IVC filter, Cancer, Cataract, Diabetes mellitus, Diverticulosis, Esophageal stricture, GERD, hiatal hernia, Hypertension, and Peripheral vascular disease.    PT Comments  Increased tolerance with functional mobility. Ambulating in room in with min assist for balance, minor LE buckling noted but corrects with RW for support. HR to 120s while ambulating, SpO2 97% on RA. Denies pain throughout session. Min assist for transfers from bed due to posterior instability. Seated balance with posterior lean, emphasis on corrective techniques and LE exercises performed today. Eager to work with therapies. Patient will continue to benefit from skilled physical therapy services to further improve independence with functional mobility. Patient will benefit from intensive inpatient follow up therapy, >3 hours/day.   If plan is discharge home, recommend the following: A lot of help with walking and/or transfers;A lot of help with bathing/dressing/bathroom;Assistance with cooking/housework;Direct supervision/assist for medications management;Direct supervision/assist for financial management;Assist for transportation;Help with stairs or ramp for entrance   Can travel by private vehicle        Equipment Recommendations  None recommended by PT (TBD)    Recommendations for Other Services Rehab consult     Precautions / Restrictions Precautions Precautions: Fall Precaution Comments: Monitor BP Restrictions Weight Bearing  Restrictions: No     Mobility  Bed Mobility Overal bed mobility: Needs Assistance Bed Mobility: Supine to Sit     Supine to sit: Min assist, HOB elevated, Used rails     General bed mobility comments: Min assist to rise and turn towards EOB. Slow with some posterior lean present. Cues for technique, rail use as needed. Assisted with spin on bed pad to square hips at EOB.    Transfers Overall transfer level: Needs assistance Equipment used: Rolling walker (2 wheels) Transfers: Sit to/from Stand Sit to Stand: Min assist           General transfer comment: Light min assist for boost and balance (posterior lean) when rising from EOB. Cues for RW placement and forward weight shift to stabilize. CGA from recliner with cues for hand placement on arm rests of chair when descending, practiced x2 with good recall.    Ambulation/Gait Ambulation/Gait assistance: Min assist Gait Distance (Feet): 30 Feet Assistive device: Rolling walker (2 wheels) Gait Pattern/deviations: Step-through pattern, Decreased stride length, Knees buckling, Decreased dorsiflexion - right, Decreased dorsiflexion - left, Knee flexed in stance - right, Knee flexed in stance - left, Shuffle, Festinating, Trunk flexed, Narrow base of support Gait velocity: dec Gait velocity interpretation: <1.31 ft/sec, indicative of household ambulator   General Gait Details: VC for larger step length and tempo, upright posture, and forward gaze intermittently. Min assist for balance and with instability while backing up towards chair to correct LOB. Educated on awareness. Minor buckling of knees but improves when he stands upright which is sustained for short periods of time. He is able to self correct knee buckling with RW support.   Stairs             Wheelchair Mobility     Tilt Bed    Modified Rankin (Stroke Patients Only)       Balance  Overall balance assessment: Needs assistance Sitting-balance support: Feet  supported, Single extremity supported Sitting balance-Leahy Scale: Poor Sitting balance - Comments: Intermittent min assist, posterior lean Postural control: Posterior lean Standing balance support: Bilateral upper extremity supported, Reliant on assistive device for balance Standing balance-Leahy Scale: Poor                              Cognition Arousal: Alert Behavior During Therapy: WFL for tasks assessed/performed Overall Cognitive Status: History of cognitive impairments - at baseline                                 General Comments: Appropriately answering all questions. Oriented.        Exercises General Exercises - Lower Extremity Ankle Circles/Pumps: AROM, Both, 15 reps, Seated Quad Sets: Strengthening, Both, 10 reps, Seated Gluteal Sets: Strengthening, Both, 10 reps, Seated Long Arc Quad: Strengthening, Both, 10 reps, Seated Hip ABduction/ADduction: Strengthening, Both, 10 reps, Supine    General Comments General comments (skin integrity, edema, etc.): HR to 122 while ambulating, REsting at 102. SpO2 97% on RA. BP 140/59.      Pertinent Vitals/Pain Pain Assessment Pain Assessment: No/denies pain Pain Intervention(s): Monitored during session    Home Living                          Prior Function            PT Goals (current goals can now be found in the care plan section) Acute Rehab PT Goals Patient Stated Goal: Get well PT Goal Formulation: With patient/family Time For Goal Achievement: 10/01/23 Potential to Achieve Goals: Good Progress towards PT goals: Progressing toward goals    Frequency    Min 1X/week      PT Plan      Co-evaluation PT/OT/SLP Co-Evaluation/Treatment: Yes Reason for Co-Treatment: To address functional/ADL transfers;For patient/therapist safety PT goals addressed during session: Mobility/safety with mobility;Balance;Proper use of DME        AM-PAC PT "6 Clicks" Mobility   Outcome  Measure  Help needed turning from your back to your side while in a flat bed without using bedrails?: A Little Help needed moving from lying on your back to sitting on the side of a flat bed without using bedrails?: A Little Help needed moving to and from a bed to a chair (including a wheelchair)?: A Little Help needed standing up from a chair using your arms (e.g., wheelchair or bedside chair)?: A Little Help needed to walk in hospital room?: A Little Help needed climbing 3-5 steps with a railing? : Total 6 Click Score: 16    End of Session Equipment Utilized During Treatment: Gait belt Activity Tolerance: Patient tolerated treatment well Patient left: with call bell/phone within reach;in chair;with nursing/sitter in room (Alerted nurse no posey boxes available on unit.) Nurse Communication: Mobility status PT Visit Diagnosis: Unsteadiness on feet (R26.81);Muscle weakness (generalized) (M62.81);Difficulty in walking, not elsewhere classified (R26.2)     Time: 9604-5409 PT Time Calculation (min) (ACUTE ONLY): 18 min  Charges:    $Gait Training: 8-22 mins PT General Charges $$ ACUTE PT VISIT: 1 Visit                     Kathlyn Sacramento, PT, DPT [facility]  Rehabilitation Services Physical Therapist Office: [phone] Website: [email or web address]  Berton Mount 09/21/2023, 10:11 AM

## 2023-09-21 NOTE — Progress Notes (Signed)
Patient transferred to 4 east room 17, without any complication all belongings place at bedside.

## 2023-09-22 DIAGNOSIS — R5381 Other malaise: Secondary | ICD-10-CM | POA: Diagnosis not present

## 2023-09-22 LAB — CBC WITH DIFFERENTIAL/PLATELET
Abs Immature Granulocytes: 0.2 10*3/uL — ABNORMAL HIGH (ref 0.00–0.07)
Basophils Absolute: 0 10*3/uL (ref 0.0–0.1)
Basophils Relative: 0 %
Eosinophils Absolute: 0.2 10*3/uL (ref 0.0–0.5)
Eosinophils Relative: 3 %
HCT: 22.8 % — ABNORMAL LOW (ref 39.0–52.0)
Hemoglobin: 7.3 g/dL — ABNORMAL LOW (ref 13.0–17.0)
Immature Granulocytes: 3 %
Lymphocytes Relative: 13 %
Lymphs Abs: 1 10*3/uL (ref 0.7–4.0)
MCH: 29.4 pg (ref 26.0–34.0)
MCHC: 32 g/dL (ref 30.0–36.0)
MCV: 91.9 fL (ref 80.0–100.0)
Monocytes Absolute: 0.6 10*3/uL (ref 0.1–1.0)
Monocytes Relative: 8 %
Neutro Abs: 5.5 10*3/uL (ref 1.7–7.7)
Neutrophils Relative %: 73 %
Platelets: 129 10*3/uL — ABNORMAL LOW (ref 150–400)
RBC: 2.48 MIL/uL — ABNORMAL LOW (ref 4.22–5.81)
RDW: 13.9 % (ref 11.5–15.5)
WBC: 7.5 10*3/uL (ref 4.0–10.5)
nRBC: 0 % (ref 0.0–0.2)

## 2023-09-22 LAB — COMPREHENSIVE METABOLIC PANEL
ALT: 31 U/L (ref 0–44)
AST: 35 U/L (ref 15–41)
Albumin: 1.9 g/dL — ABNORMAL LOW (ref 3.5–5.0)
Alkaline Phosphatase: 80 U/L (ref 38–126)
Anion gap: 5 (ref 5–15)
BUN: 25 mg/dL — ABNORMAL HIGH (ref 8–23)
CO2: 24 mmol/L (ref 22–32)
Calcium: 7.8 mg/dL — ABNORMAL LOW (ref 8.9–10.3)
Chloride: 111 mmol/L (ref 98–111)
Creatinine, Ser: 1.19 mg/dL (ref 0.61–1.24)
GFR, Estimated: >60 mL/min (ref >60)
Glucose, Bld: 130 mg/dL — ABNORMAL HIGH (ref 70–99)
Potassium: 3.4 mmol/L — ABNORMAL LOW (ref 3.5–5.1)
Sodium: 140 mmol/L (ref 135–145)
Total Bilirubin: 0.8 mg/dL (ref <1.2)
Total Protein: 4.6 g/dL — ABNORMAL LOW (ref 6.5–8.1)

## 2023-09-22 LAB — GLUCOSE, CAPILLARY
Glucose-Capillary: 126 mg/dL — ABNORMAL HIGH (ref 70–99)
Glucose-Capillary: 190 mg/dL — ABNORMAL HIGH (ref 70–99)
Glucose-Capillary: 151 mg/dL — ABNORMAL HIGH (ref 70–99)
Glucose-Capillary: 183 mg/dL — ABNORMAL HIGH (ref 70–99)

## 2023-09-22 MED ORDER — METFORMIN HCL 500 MG PO TABS
500.0000 mg | ORAL_TABLET | Freq: Two times a day (BID) | ORAL | Status: DC
  Administered 2023-09-22 – 2023-10-04 (×24): 500 mg via ORAL
  Filled 2023-09-22 (×24): qty 1

## 2023-09-22 MED ORDER — POTASSIUM CHLORIDE CRYS ER 20 MEQ PO TBCR
40.0000 meq | EXTENDED_RELEASE_TABLET | Freq: Once | ORAL | Status: AC
  Administered 2023-09-22: 40 meq via ORAL
  Filled 2023-09-22: qty 2

## 2023-09-22 MED ORDER — FERROUS SULFATE 325 (65 FE) MG PO TABS
325.0000 mg | ORAL_TABLET | Freq: Every day | ORAL | Status: DC
  Administered 2023-09-23 – 2023-10-04 (×12): 325 mg via ORAL
  Filled 2023-09-22 (×12): qty 1

## 2023-09-22 NOTE — Progress Notes (Signed)
PMR Admission Coordinator Pre-Admission Assessment   Patient: Walter [name] is an 77 y.o., male MRN: [id number] DOB: 20-Jan-1946 Height: 5\' 7"  (170.2 cm) Weight: 66.6 kg   Insurance Information HMO:  yes   PPO:      PCP:     IPA:      80/20:      OTHER:  PRIMARYAlinda Deem      Policy#: [id number]      Subscriber: Pt CM Name:Tiffany      Phone#: [phone]     Fax#: [phone] Tiffany with [employer] called with auth on 11/20 for admit 11/20- 18/56 Pre-Cert#: [id number]      Employer:  Benefits:  Phone #: [phone]     Name: Dolores Hoose Date: 11/01/2022- still active Deductible: does not have OOP Max: $4,500 ($2,454 met) CIR: $295/day co-pay with a max co-pay of $1,770/admission (6 days) SNF: $0/day co-pay for days 1-20, $203/day co-pay for days 21-100; limited to 100 days/cal yr. Outpatient:  $20 copay/visit Home Health:  100% coverage DME: 80% coverage; 20% co-insurance Providers: in network   SECONDARY:       Policy#:      Phone#:    Artist:       Phone#:    The Data processing manager" for patients in Inpatient Rehabilitation Facilities with attached "Privacy Act Statement-Health Care Records" was provided and verbally reviewed with: Patient   Emergency Contact Information Contact Information       Name Relation Home Work Mobile    [place] Spouse [phone]   [phone]    Tawana Scale Sister [phone]        Marinda Elk     [phone]         Other Contacts   None on File        Current Medical History  Patient Admitting Diagnosis: debility after abdominal aortic aneurysm repair  History of Present Illness: Walter [name] is a 77 y.o. male with a history of an abdominal aortic aneurysm, as well as Parkinson's disease with dementia, diabetes, CAD, hypertension, peripheral artery disease who elected to go repair of said aneurysm and was admitted  to [facility] on 09/16/2023 for the surgery.   Postoperative course was complicated by acute blood loss anemia requiring 5 units of packed red blood cells and pressors, acute kidney injuries and metabolic acidosis.  Patient has stabilized but is significantly deconditioned and dealing with severe protein calorie malnutrition as well.  Patient lives at home with his wife in a two-level house with one-step to enter.  They are able to live on the main level with the bedroom and bathroom.  Prior to this admission patient used a rollator typically in the morning and for longer distances.  Wife assists him with basic ADLs as far as getting in and out of the bathtub/bathroom.  Otherwise he is reported as being independent.  Pt. Was seen by PT/OT and they recommended CIR to assist return to PLOF.   Patient's medical record from [facility]  has been reviewed by the rehabilitation admission coordinator and physician.   Past Medical History      Past Medical History:  Diagnosis Date   Allergy     Cancer [facility])      Skin cancer   Cataract     Coronary artery disease     Diabetes mellitus     Diverticulosis     Esophageal stricture     GERD (gastroesophageal reflux disease)  History of hiatal hernia     Hyperlipidemia     Hypertension     Peripheral vascular disease (HCC)            Has the patient had major surgery during 100 days prior to admission? Yes   Family History   family history includes Breast cancer in his mother; Healthy in his son and son; Parkinson's disease in his sister.   Current Medications  Current Medications    Current Facility-Administered Medications:    0.9 %  sodium chloride infusion, 500 mL, Intravenous, Once PRN, Baglia, Corrina, PA-C   0.9 %  sodium chloride infusion, 500 mL, Intravenous, Once PRN, Baglia, Corrina, PA-C   acetaminophen (TYLENOL) tablet 325-650 mg, 325-650 mg, Oral, Q4H PRN, 650 mg at 09/21/23 0146 **OR** acetaminophen (TYLENOL) suppository 325-650 mg, 325-650 mg, Rectal,  Q4H PRN, Baglia, Corrina, PA-C   alum & mag hydroxide-simeth (MAALOX/MYLANTA) 200-200-20 MG/5ML suspension 15-30 mL, 15-30 mL, Oral, Q2H PRN, Baglia, Corrina, PA-C   aspirin EC tablet 81 mg, 81 mg, Oral, Daily, Baglia, Corrina, PA-C, 81 mg at 09/20/23 0831   atorvastatin (LIPITOR) tablet 40 mg, 40 mg, Oral, Daily, Olalere, Adewale A, MD, 40 mg at 09/20/23 0831   bisacodyl (DULCOLAX) EC tablet 5 mg, 5 mg, Oral, Daily PRN, Baglia, Corrina, PA-C   Chlorhexidine Gluconate Cloth 2 % PADS 6 each, 6 each, Topical, Daily, Maeola Harman, MD, 6 each at 09/20/23 1028   docusate sodium (COLACE) capsule 100 mg, 100 mg, Oral, Daily, Baglia, Corrina, PA-C, 100 mg at 09/17/23 0913   feeding supplement (ENSURE ENLIVE / ENSURE PLUS) liquid 237 mL, 237 mL, Oral, BID BM, Gwenevere Abbot, MD, 237 mL at 09/20/23 1405   guaiFENesin-dextromethorphan (ROBITUSSIN DM) 100-10 MG/5ML syrup 15 mL, 15 mL, Oral, Q4H PRN, Baglia, Corrina, PA-C   hydrALAZINE (APRESOLINE) injection 10 mg, 10 mg, Intravenous, Q2H PRN, Nada Libman, MD, 10 mg at 09/21/23 0403   HYDROmorphone (DILAUDID) injection 0.5-1 mg, 0.5-1 mg, Intravenous, Q2H PRN, Baglia, Corrina, PA-C, 0.5 mg at 09/16/23 2130   insulin aspart (novoLOG) injection 0-9 Units, 0-9 Units, Subcutaneous, TID AC & HS, Maeola Harman, MD, 2 Units at 09/21/23 3474   magnesium sulfate IVPB 2 g 50 mL, 2 g, Intravenous, Daily PRN, Baglia, Corrina, PA-C   memantine (NAMENDA) tablet 10 mg, 10 mg, Oral, BID, Mannam, Praveen, MD, 10 mg at 09/20/23 2125   methocarbamol (ROBAXIN) injection 500 mg, 500 mg, Intravenous, Q6H PRN, Maeola Harman, MD   ondansetron [facility]) injection 4 mg, 4 mg, Intravenous, Q6H PRN, Baglia, Corrina, PA-C, 4 mg at 09/16/23 2127   Oral care mouth rinse, 15 mL, Mouth Rinse, PRN, Maeola Harman, MD   oxyCODONE (Oxy IR/ROXICODONE) immediate release tablet 5-10 mg, 5-10 mg, Oral, Q4H PRN, Baglia, Corrina, PA-C, 5 mg at 09/19/23  2130   pantoprazole (PROTONIX) EC tablet 40 mg, 40 mg, Oral, Daily, Baglia, Corrina, PA-C, 40 mg at 09/20/23 0831   phenol (CHLORASEPTIC) mouth spray 1 spray, 1 spray, Mouth/Throat, PRN, Baglia, Corrina, PA-C   potassium chloride SA (KLOR-CON M) CR tablet 20-40 mEq, 20-40 mEq, Oral, Daily PRN, Baglia, Corrina, PA-C   senna-docusate (Senokot-S) tablet 1 tablet, 1 tablet, Oral, QHS PRN, Baglia, Corrina, PA-C   sodium chloride flush (NS) 0.9 % injection 10 mL, 10 mL, Intravenous, Q12H, Baglia, Corrina, PA-C, 10 mL at 09/20/23 2126   sodium chloride flush (NS) 0.9 % injection 10 mL, 10 mL, Intravenous, Q12H, Baglia, Corrina, PA-C, 10 mL at 09/20/23  2125     Patients Current Diet:  Diet Order                  DIET DYS 3 Room service appropriate? Yes; Fluid consistency: Thin  Diet effective now                         Precautions / Restrictions Precautions Precautions: Fall Precaution Comments: Monitor BP Restrictions Weight Bearing Restrictions: No    Has the patient had 2 or more falls or a fall with injury in the past year? No   Prior Activity Level Community (5-7x/wk): Pt. active in the community PTA   Prior Functional Level Self Care: Did the patient need help bathing, dressing, using the toilet or eating? Independent   Indoor Mobility: Did the patient need assistance with walking from room to room (with or without device)? Independent   Stairs: Did the patient need assistance with internal or external stairs (with or without device)? Independent   Functional Cognition: Did the patient need help planning regular tasks such as shopping or remembering to take medications? Needed some help   Patient Information Are you of Hispanic, Latino/a,or Spanish origin?: A. No, not of Hispanic, Latino/a, or Spanish origin What is your race?: A. White Do you need or want an interpreter to communicate with a doctor or health care staff?: 0. No   Patient's Response To:  Health Literacy  and Transportation Is the patient able to respond to health literacy and transportation needs?: Yes Health Literacy - How often do you need to have someone help you when you read instructions, pamphlets, or other written material from your doctor or pharmacy?: Never In the past 12 months, has lack of transportation kept you from medical appointments or from getting medications?: No In the past 12 months, has lack of transportation kept you from meetings, work, or from getting things needed for daily living?: No   Journalist, newspaper / Equipment Home Equipment: Rollator (4 wheels), BSC/3in1, Toilet riser, Agricultural consultant (2 wheels), Grab bars - toilet, Grab bars - tub/shower, Shower seat   Prior Device Use: Indicate devices/aids used by the patient prior to current illness, exacerbation or injury? None of the above   Current Functional Level Cognition   Overall Cognitive Status: History of cognitive impairments - at baseline Orientation Level: Oriented X4 General Comments: slow response to questions and to commands with motor output    Extremity Assessment (includes Sensation/Coordination)   Upper Extremity Assessment: Generalized weakness, RUE deficits/detail, LUE deficits/detail (edema in hands) RUE Coordination: decreased fine motor, decreased gross motor LUE Coordination: decreased fine motor, decreased gross motor  Lower Extremity Assessment: Defer to PT evaluation     ADLs   Overall ADL's : Needs assistance/impaired Eating/Feeding: Set up, Sitting Grooming: Wash/dry hands, Wash/dry face, Sitting, Moderate assistance Grooming Details (indicate cue type and reason): difficulty with hand to face Upper Body Bathing: Maximal assistance, Sitting Lower Body Bathing: +2 for physical assistance, Total assistance, Sit to/from stand Upper Body Dressing : Maximal assistance, Sitting Lower Body Dressing: Total assistance, +2 for physical assistance, Sit to/from stand Toileting- Designer, fashion/clothing and Hygiene: Total assistance, +2 for physical assistance, Sit to/from stand     Mobility   Overal bed mobility: Needs Assistance Bed Mobility: Supine to Sit Rolling: Min assist, Used rails Sidelying to sit: Mod assist, Used rails, HOB elevated Supine to sit: Mod assist Sit to sidelying: Min assist General bed mobility comments: in chair  Transfers   Overall transfer level: Needs assistance Equipment used: Rolling walker (2 wheels) Transfers: Sit to/from Stand Sit to Stand: Mod assist Bed to/from chair/wheelchair/BSC transfer type:: Step pivot Step pivot transfers: Mod assist General transfer comment: assist to rise and transfer weight anterior, slow to rise     Ambulation / Gait / Stairs / Wheelchair Mobility   Ambulation/Gait General Gait Details: Deferred to low BP with mobility.     Posture / Balance Dynamic Sitting Balance Sitting balance - Comments: CGA Balance Overall balance assessment: Needs assistance Sitting-balance support: Feet supported, Single extremity supported Sitting balance-Leahy Scale: Poor Sitting balance - Comments: CGA Postural control: Right lateral lean Standing balance support: Bilateral upper extremity supported Standing balance-Leahy Scale: Poor     Special needs/care consideration Skin   and Special service needs      Previous Home Environment (from acute therapy documentation) Living Arrangements: Spouse/significant other  Lives With: Spouse Available Help at Discharge: Family, Available PRN/intermittently Type of Home: House Home Layout: Two level, Able to live on main level with bedroom/bathroom Home Access: Stairs to enter Entrance Stairs-Rails: None Entrance Stairs-Number of Steps: 1 Bathroom Shower/Tub: Armed forces operational officer Accessibility: Yes   Discharge Living Setting Plans for Discharge Living Setting: Patient's home Type of Home at Discharge: House Discharge Home Layout: Two  level, Able to live on main level with bedroom/bathroom Alternate Level Stairs-Rails: Right Alternate Level Stairs-Number of Steps: flight Discharge Home Access: Stairs to enter Entrance Stairs-Rails: None Entrance Stairs-Number of Steps: 1 Discharge Bathroom Shower/Tub: Tub/shower unit Discharge Bathroom Toilet: Standard Discharge Bathroom Accessibility: Yes How Accessible: Accessible via walker, Accessible via wheelchair   Social/Family/Support Systems Patient Roles: Spouse Contact Information: [phone] Anticipated Caregiver: Rhunette Croft Ability/Limitations of Caregiver: Min A Caregiver Availability: 24/7 Discharge Plan Discussed with Primary Caregiver: Yes Is Caregiver In Agreement with Plan?: Yes Does Caregiver/Family have Issues with Lodging/Transportation while Pt is in Rehab?: Yes   Goals Patient/Family Goal for Rehab: PT/OT Supervision Expected length of stay: 16-18 days Pt/Family Agrees to Admission and willing to participate: Yes Program Orientation Provided & Reviewed with Pt/Caregiver Including Roles  & Responsibilities: Yes   Decrease burden of Care through IP rehab admission: not anticipated   Possible need for SNF placement upon discharge: not anticipated   Patient Condition: I have reviewed medical records from [facility], spoken with CM, and patient and spouse. I met with patient at the bedside for inpatient rehabilitation assessment.  Patient will benefit from ongoing PT and OT, can actively participate in 3 hours of therapy a day 5 days of the week, and can make measurable gains during the admission.  Patient will also benefit from the coordinated team approach during an Inpatient Acute Rehabilitation admission.  The patient will receive intensive therapy as well as Rehabilitation physician, nursing, social worker, and care management interventions.  Due to safety, skin/wound care, disease management, medication administration, pain management, and  patient education the patient requires 24 hour a day rehabilitation nursing.  The patient is currently mod A-max A with mobility and basic ADLs.  Discharge setting and therapy post discharge at home with home health is anticipated.  Patient has agreed to participate in the Acute Inpatient Rehabilitation Program and will admit today.   Preadmission Screen Completed By:  Jeronimo Greaves, 09/21/2023 8:47 AM ______________________________________________________________________   Discussed status with Dr. Carlis Abbott  on 09/21/23 at 930 and received approval for admission today.   Admission Coordinator:  Jeronimo Greaves, CCC-SLP, time 1430/Date 09/21/23  Assessment/Plan: Diagnosis: Debility 2/2 AAA repair Does the need for close, 24 hr/day Medical supervision in concert with the patient's rehab needs make it unreasonable for this patient to be served in a less intensive setting? Yes Co-Morbidities requiring supervision/potential complications:  Due to bladder management, bowel management, safety, skin/wound care, disease management, medication administration, pain management, and patient education, does the patient require 24 hr/day rehab nursing? Yes Does the patient require coordinated care of a physician, rehab nurse, PT, OT to address physical and functional deficits in the context of the above medical diagnosis(es)? Yes Addressing deficits in the following areas: balance, endurance, locomotion, strength, transferring, bowel/bladder control, bathing, dressing, feeding, grooming, and toileting Can the patient actively participate in an intensive therapy program of at least 3 hrs of therapy 5 days a week? Yes The potential for patient to make measurable gains while on inpatient rehab is excellent Anticipated functional outcomes upon discharge from inpatient rehab: supervision PT, supervision OT, supervision SLP Estimated rehab length of stay to reach the above functional goals is: 10-14 days Anticipated  discharge destination: Home 10. Overall Rehab/Functional Prognosis: excellent     MD Signature: Sula Soda, MD          Revision History

## 2023-09-22 NOTE — Discharge Summary (Signed)
Physician Discharge Summary  Patient ID: Irving Poteat MRN: [id number] DOB/AGE: May 18, 1946 77 y.o.  Admit date: 09/21/2023 Discharge date: 10/04/2023  Discharge Diagnoses:  Principal Problem:   Debility Active problems Pain History of dementia Dysphagia Hypertension Acute kidney injury Acute blood loss anemia/chronic anemia Prediabetes History of duodenal angiectasia GERD Coronary artery disease Hyperlipidemia Hypokalemia Thrombocytopenia Constipation HH Duodenal diverticula                                                                                                                                                                                                                                                         Discharged Condition: stable  Significant Diagnostic Studies:                                                            Labs:  Basic Metabolic Panel: Recent Labs  Lab 09/16/23 1202 09/16/23 1530 09/16/23 1855 09/17/23 0341 09/17/23 0900 09/18/23 0331 09/19/23 0941 09/20/23 1001 09/22/23 0521  NA 138  --  139 139 140 139 139 138 140  K 4.4  --  5.3* 5.6* 5.4* 3.9 3.8 4.0 3.4*  CL  --   --  110 113*  --  113* 110 108 111  CO2  --   --  18* 15*  --  17* 23 21* 24  GLUCOSE  --   --  234* 244*  --  126* 218* 189* 130*  BUN  --   --  25* 32*  --  47* 31* 26* 25*  CREATININE  --  1.06 1.65* 2.10*  --  2.81* 1.66* 1.29* 1.19  CALCIUM  --   --  8.5* 7.7*  --  7.0* 7.3* 7.9* 7.8*  MG  --   --  1.4*  --   --   --   --   --   --     CBC: Recent Labs  Lab 09/19/23 0941 09/20/23 1001 09/22/23 0521  WBC 12.0* 9.8 7.5  NEUTROABS  --   --  5.5  HGB 8.9* 8.8* 7.3*  HCT 26.4* 26.7* 22.8*  MCV 90.4 91.4 91.9  PLT 79* 86* 129*    CBG: Recent Labs  Lab 09/21/23 0819 09/21/23 1212 09/21/23 1654 09/21/23 2103 09/22/23 0605  GLUCAP 180* 274* 132* 137* 126*    Brief HPI:   Walter [name] is a 77 y.o. male  was initially admitted to the  hospital in May 2024 and found to have an 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  He was severely deconditioned and also had concomitant pulmonary embolus and ultimately underwent IVC filter placement due to intolerance of Eliquis with GI bleeding.  He underwent preoperative cardiac clearance with stress testing and was moderate risk for general anesthesia.  On 09/16/2023 he was taken to the operating room and underwent endovascular repair of abdominal aortic aneurysm with left common iliac to left common femoral artery bypass and stent placement to left external iliac artery.  He was transfused 3 units of packed red blood cells for surgical losses.  He was transferred to intensive care on peripheral vasopressors, acute kidney injury and acidosis.  He was transfused additional unit of packed red blood cells.  IV fluid resuscitation continued.  Sliding scale insulin coverage for prediabetes.  He has a history of dementia on Namenda. Weaned off vasopressors and hemoglobin stabilized.  He was transferred to the stepdown unit on 11/18.  Serum creatinine continues downward trend.  Tolerating diet.   Hospital Course: Zykeem Ifft was admitted to rehab 09/21/2023 for inpatient therapies to consist of PT, ST and OT at least three hours five days a week. Past admission physiatrist, therapy team and rehab RN have worked together to provide customized collaborative inpatient rehab.  Follow-up labs 11/21 hemoglobin down to 7.3 from 8.8 in 2 days.  Follow-up CBC as well as fecal occult blood test daily x 3 days ordered.  Iron sulfate 325 mg daily.  Conferenced vascular surgery regarding drop in hemoglobin and did not recommend any imaging at this time.  Restarted metformin 500 mg twice daily.  Labs with improvement in hemoglobin, serum potassium and platelet count 11/22. Large Bm on 11/23 without report of melena, hematochezia; however FOBT was positive. Hgb down 7.1 on 11/25. Platelet count improved to  258k. Patient and wife agree to transfusion of 2 units PRBCs and GI consulted. Patient recently had an EGD and colonoscopy done in 03/2023 that showed a 3 cm hiatal hernia, duodenal angioectasia s/p APC, nonbleeding duodenal diverticula, and severe diverticulosis of the colon. CT A/P obtained and aorto-enteric fistula ruled out. However, CT did reveal hematoma surrounding his femoral-femoral bypass graft that could be a source of the patient's blood loss. CT also does show some persistent mural thickening at the GE junction with evidence of hiatal hernia that could be concerning for esophagitis/gastritis. Patient's most recent hemoglobin has responded very well with a hemoglobin of 9.3. Tums started for hypocalcemia with improvement. Small bowel enteroscopy performed on 11/27 by Dr. Leonides Schanz with findings of 7 non-bleeding angioectasias in the duodenum treated with APC. Keflex started for left groin cellulitis on 11/28. He remained afebrile without leukocytosis. Hgb improved further to 9.9 on 12/02.    Blood pressures were monitored on TID basis and home Norvasc 5 mg restarted on 11/22. Losartan 25 mg started 11/26.  Diabetes has been monitored with ac/hs CBG checks and SSI was use prn for tighter BS control. Restarted home metformin 500 mg BID on 11/21. Plan to continue this dose without SSI at discharge.    Rehab course: During patient's stay in rehab weekly team conferences were held to  monitor patient's progress, set goals and discuss barriers to discharge. At admission, patient required min assist with mobility and max assist with basic self-care skills.  He has had improvement in activity tolerance, balance, postural control as well as ability to compensate for deficits. He has had improvement in functional use RUE/LUE  and RLE/LLE as well as improvement in awareness       Disposition:  There are no questions and answers to display.         Diet: carb modified, dysphagia 3 with thin  liquids  Special Instructions: No driving, alcohol consumption or tobacco use.  Follow-up CBC, CMP with PCP in 1-2 weeks.    Allergies as of 09/22/2023   No Known Allergies   Med Rec must be completed prior to using this [facility]***        Signed: Milinda Antis 09/22/2023, 9:32 AM

## 2023-09-22 NOTE — Plan of Care (Signed)
  Problem: Education: Goal: Knowledge of discharge needs will improve Outcome: Progressing   Problem: Clinical Measurements: Goal: Postoperative complications will be avoided or minimized Outcome: Progressing   Problem: Respiratory: Goal: Ability to achieve and maintain a regular respiratory rate will improve Outcome: Progressing   Problem: Skin Integrity: Goal: Demonstration of wound healing without infection will improve Outcome: Progressing   

## 2023-09-22 NOTE — Progress Notes (Signed)
Physical Medicine and Rehabilitation Consult Reason for Consult:debility after abdominal aortic aneurysm repair Referring Physician: Randie Heinz     HPI: Walter [name] is a 77 y.o. male with a history of an abdominal aortic aneurysm, as well as Parkinson's disease with dementia, diabetes, CAD, hypertension, peripheral artery disease who elected to go repair of said aneurysm and was admitted on 09/16/2023 for the surgery.  Postoperative course was complicated by acute blood loss anemia requiring 5 units of packed red blood cells and pressors, acute kidney injuries and metabolic acidosis.  Patient has stabilized but is significantly deconditioned and dealing with severe protein calorie malnutrition as well.  He was up with physical therapy yesterday and mod assist for sit to stand transfers but gait was not attempted as he was very slow advancing his right leg with significantly flexed posture.  Occupational Therapy saw the patient as well yesterday and he was max to total assistance for basic self-care tasks.  Patient lives at home with his wife in a two-level house with one-step to enter.  They are able to live on the main level with the bedroom and bathroom.  Prior to this admission patient used a rollator typically in the morning and for longer distances.  Wife assists him with basic ADLs as far as getting in and out of the bathtub/bathroom.  Otherwise he is reported as being independent.     Review of Systems  Constitutional:  Positive for malaise/fatigue. Negative for fever.  HENT: Negative.    Eyes: Negative.   Respiratory:  Positive for cough and shortness of breath.   Cardiovascular: Negative.   Gastrointestinal:  Positive for abdominal pain.  Genitourinary: Negative.   Skin:  Negative for rash.  Neurological:  Positive for focal weakness and weakness.  Psychiatric/Behavioral: Negative.          Past Medical History:  Diagnosis Date   Allergy     Cancer [facility])      Skin cancer    Cataract     Coronary artery disease     Diabetes mellitus     Diverticulosis     Esophageal stricture     GERD (gastroesophageal reflux disease)     History of hiatal hernia     Hyperlipidemia     Hypertension     Peripheral vascular disease (HCC)               Past Surgical History:  Procedure Laterality Date   BIOPSY   03/07/2023    Procedure: BIOPSY;  Surgeon: Beverley Fiedler, MD;  Location: [facility] ENDOSCOPY;  Service: Gastroenterology;;   CATARACT EXTRACTION Bilateral     COLONOSCOPY WITH PROPOFOL N/A 03/07/2023    Procedure: COLONOSCOPY WITH PROPOFOL;  Surgeon: Beverley Fiedler, MD;  Location: [facility] ENDOSCOPY;  Service: Gastroenterology;  Laterality: N/A;   CORONARY ANGIOPLASTY WITH STENT PLACEMENT   2004   ESOPHAGOGASTRODUODENOSCOPY   06/07/2012    Procedure: ESOPHAGOGASTRODUODENOSCOPY (EGD);  Surgeon: Hart Carwin, MD;  Location: [facility] ENDOSCOPY;  Service: Endoscopy;  Laterality: N/A;   ESOPHAGOGASTRODUODENOSCOPY (EGD) WITH PROPOFOL N/A 03/07/2023    Procedure: ESOPHAGOGASTRODUODENOSCOPY (EGD) WITH PROPOFOL;  Surgeon: Beverley Fiedler, MD;  Location: [facility] ENDOSCOPY;  Service: Gastroenterology;  Laterality: N/A;   HOT HEMOSTASIS N/A 03/07/2023    Procedure: HOT HEMOSTASIS (ARGON PLASMA COAGULATION/BICAP);  Surgeon: Beverley Fiedler, MD;  Location: [facility] ENDOSCOPY;  Service: Gastroenterology;  Laterality: N/A;   IR KYPHO LUMBAR INC FX REDUCE BONE BX UNI/BIL CANNULATION INC/IMAGING   03/02/2023  IVC FILTER INSERTION N/A 03/09/2023    Procedure: IVC FILTER INSERTION;  Surgeon: Victorino Sparrow, MD;  Location: [facility] INVASIVE CV LAB;  Service: Cardiovascular;  Laterality: N/A;   POLYPECTOMY   03/07/2023    Procedure: POLYPECTOMY;  Surgeon: Beverley Fiedler, MD;  Location: [facility] ENDOSCOPY;  Service: Gastroenterology;;   SHOULDER SURGERY   11/01/2004             Family History  Problem Relation Age of Onset   Breast cancer Mother     Healthy Son     Healthy Son     Parkinson's disease Sister     Colon  cancer Neg Hx     Esophageal cancer Neg Hx     Rectal cancer Neg Hx     Stomach cancer Neg Hx          Social History:  reports that he quit smoking about 23 years ago. His smoking use included cigarettes. He has never used smokeless tobacco. He reports that he does not drink alcohol and does not use drugs. Allergies:  Allergies  No Known Allergies         Medications Prior to Admission  Medication Sig Dispense Refill   amLODipine (NORVASC) 5 MG tablet Take 1 tablet by mouth once daily 90 tablet 1   aspirin EC 81 MG tablet Take 1 tablet (81 mg total) by mouth daily. Swallow whole.       atorvastatin (LIPITOR) 40 MG tablet Take 1 tablet by mouth once daily 90 tablet 0   cyanocobalamin 2000 MCG tablet Take 1 tablet (2,000 mcg total) by mouth daily. 30 tablet 0   Ferrous Sulfate (IRON) 325 (65 Fe) MG TABS Take 1 tablet by mouth once daily 30 tablet 0   loratadine (ALLERGY RELIEF) 10 MG tablet Take 10 mg by mouth daily.       losartan (COZAAR) 100 MG tablet Take 1 tablet by mouth once daily 90 tablet 0   memantine (NAMENDA) 10 MG tablet Take 1 tablet twice a day 180 tablet 3   Multiple Vitamin (MULTIVITAMIN WITH MINERALS) TABS tablet Take 1 tablet by mouth daily.       Blood Glucose Monitoring Suppl (ONE TOUCH ULTRA 2) w/Device KIT 1 kit by Does not apply route daily. 1 kit 0   Lancets (ONETOUCH ULTRASOFT) lancets Check blood sugars once daily. Dx: E11.51 100 each 12   ONETOUCH ULTRA test strip USE 1 STRIP TO CHECK GLUCOSE ONCE DAILY 100 each 2   pantoprazole (PROTONIX) 40 MG tablet Take 1 tablet (40 mg total) by mouth daily.              Home: Home Living Family/patient expects to be discharged to:: Private residence Living Arrangements: Spouse/significant other Available Help at Discharge: Family, Available PRN/intermittently Type of Home: House Home Access: Stairs to enter Secretary/administrator of Steps: 1 Entrance Stairs-Rails: None Home Layout: Two level, Able to live on  main level with bedroom/bathroom Bathroom Shower/Tub: Engineer, manufacturing systems: Standard Bathroom Accessibility: Yes Home Equipment: Rollator (4 wheels), BSC/3in1, Toilet riser, Rolling Walker (2 wheels), Grab bars - toilet, Grab bars - tub/shower, Shower seat  Functional History: Prior Function Prior Level of Function : Needs assist Mobility Comments: Uses rollator in the morning when he first wakes up, or if he has to walk to the bathroom at night ADLs Comments: Wife assisted with pt getting in and out of the tub and IADLs, pt otherwise independent Functional Status:  Mobility: Bed Mobility Overal  bed mobility: Needs Assistance Bed Mobility: Supine to Sit Rolling: Min assist, Used rails Sidelying to sit: Mod assist, Used rails, HOB elevated Supine to sit: Mod assist Sit to sidelying: Min assist General bed mobility comments: in chair Transfers Overall transfer level: Needs assistance Equipment used: Rolling walker (2 wheels) Transfers: Sit to/from Stand Sit to Stand: Mod assist Bed to/from chair/wheelchair/BSC transfer type:: Step pivot Step pivot transfers: Mod assist General transfer comment: assist to rise and transfer weight anterior, slow to rise Ambulation/Gait General Gait Details: Deferred to low BP with mobility.   ADL: ADL Overall ADL's : Needs assistance/impaired Eating/Feeding: Set up, Sitting Grooming: Wash/dry hands, Wash/dry face, Sitting, Moderate assistance Grooming Details (indicate cue type and reason): difficulty with hand to face Upper Body Bathing: Maximal assistance, Sitting Lower Body Bathing: +2 for physical assistance, Total assistance, Sit to/from stand Upper Body Dressing : Maximal assistance, Sitting Lower Body Dressing: Total assistance, +2 for physical assistance, Sit to/from stand Toileting- Architect and Hygiene: Total assistance, +2 for physical assistance, Sit to/from stand   Cognition: Cognition Overall Cognitive  Status: History of cognitive impairments - at baseline Orientation Level: Oriented X4 Cognition Arousal: Alert Behavior During Therapy: Flat affect Overall Cognitive Status: History of cognitive impairments - at baseline General Comments: slow response to questions and to commands with motor output   Blood pressure (!) 150/62, pulse 89, temperature 98.5 F (36.9 C), temperature source Oral, resp. rate 19, height 5\' 7"  (1.702 m), weight 66.6 kg, SpO2 94%. Physical Exam Constitutional:      General: He is not in acute distress. HENT:     Head: Normocephalic.     Nose: Nose normal.     Mouth/Throat:     Mouth: Mucous membranes are moist.  Eyes:     Extraocular Movements: Extraocular movements intact.     Pupils: Pupils are equal, round, and reactive to light.  Cardiovascular:     Rate and Rhythm: Normal rate and regular rhythm.  Pulmonary:     Effort: Pulmonary effort is normal.  Abdominal:     Palpations: Abdomen is soft.  Musculoskeletal:        General: No swelling or tenderness.     Cervical back: Normal range of motion.  Skin:    Comments: Abdominal and groin incisions CDI  Neurological:     Mental Status: He is alert.     Comments: Alert and oriented x 3. Normal insight and awareness. Intact Memory. Normal language. Speech sl dysarthric, masked facies. Cranial nerve exam unremarkable. MMT: LUE 4-/5, RUE 4+/5. BLE 3- to 3/5 HF, 4- KE and 4/5 ADF/PF. +cogwheeling rigidity, LUE>RUE and to a lesser extent in the LE's. Senses pain and LT in all 4's.    Psychiatric:        Mood and Affect: Mood normal.        Behavior: Behavior normal.        Lab Results Last 24 Hours       Results for orders placed or performed during the hospital encounter of 09/16/23 (from the past 24 hour(s))  Glucose, capillary     Status: Abnormal    Collection Time: 09/19/23 11:14 AM  Result Value Ref Range    Glucose-Capillary 196 (H) 70 - 99 mg/dL  Glucose, capillary     Status: Abnormal     Collection Time: 09/19/23  5:17 PM  Result Value Ref Range    Glucose-Capillary 150 (H) 70 - 99 mg/dL  Glucose, capillary     Status:  Abnormal    Collection Time: 09/19/23  9:21 PM  Result Value Ref Range    Glucose-Capillary 212 (H) 70 - 99 mg/dL  Glucose, capillary     Status: Abnormal    Collection Time: 09/20/23  8:41 AM  Result Value Ref Range    Glucose-Capillary 175 (H) 70 - 99 mg/dL      Imaging Results (Last 48 hours)  No results found.     Assessment/Plan: Diagnosis: 77 year old male with Parkinson's disease status post abdominal aortic aneurysm repair. He is substantially deconditioned.  Does the need for close, 24 hr/day medical supervision in concert with the patient's rehab needs make it unreasonable for this patient to be served in a less intensive setting? Yes Co-Morbidities requiring supervision/potential complications:  -Hemorrhagic shock status post 5 units packed red blood cells -Acute kidney injury -Hypertension -Severe protein calorie malnutrition -Dementia Due to bladder management, bowel management, safety, skin/wound care, disease management, medication administration, pain management, and patient education, does the patient require 24 hr/day rehab nursing? Yes Does the patient require coordinated care of a physician, rehab nurse, therapy disciplines of PT, OT to address physical and functional deficits in the context of the above medical diagnosis(es)? Yes Addressing deficits in the following areas: balance, endurance, locomotion, strength, transferring, bowel/bladder control, bathing, dressing, feeding, grooming, toileting, and psychosocial support Can the patient actively participate in an intensive therapy program of at least 3 hrs of therapy per day at least 5 days per week? Yes The potential for patient to make measurable gains while on inpatient rehab is excellent Anticipated functional outcomes upon discharge from inpatient rehab are supervision and min  assist  with PT, supervision and min assist with OT, n/a with SLP. Estimated rehab length of stay to reach the above functional goals is: 14-20 days Anticipated discharge destination: Home Overall Rehab/Functional Prognosis: excellent   POST ACUTE RECOMMENDATIONS: This patient's condition is appropriate for continued rehabilitative care in the following setting: CIR Patient has agreed to participate in recommended program. Yes Note that insurance prior authorization may be required for reimbursement for recommended care.   Comment: Pt at home with wife. He was independent with rollator at home except for showering for which he required supervision. Rehab Admissions Coordinator to follow up.           I have personally performed a face to face diagnostic evaluation of this patient. Additionally, I have examined the patient's medical record including any pertinent labs and radiographic images. If the physician assistant has documented in this note, I have reviewed and edited or otherwise concur with the physician assistant's documentation.   Thanks,   Ranelle Oyster, MD 09/20/2023          Routing History

## 2023-09-22 NOTE — Progress Notes (Signed)
Occupational Therapy Session Note  Patient Details  Name: Walter [name] MRN: [id number] Date of Birth: October 13, 1946  Today's Date: 09/22/2023 OT Individual Time: 4098-1191 OT Individual Time Calculation (min): 73 min    Short Term Goals: Week 1:  OT Short Term Goal 1 (Week 1): Pt will don shirt with mod A OT Short Term Goal 2 (Week 1): Pt will perform sit to stand with close supervision in prep for LB clothing management OT Short Term Goal 3 (Week 1): Pt will performing 1 grooming tasks in standing wtih min A for balance OT Short Term Goal 4 (Week 1): Pt will thread pants with mod A with extra time OT Short Term Goal 5 (Week 1): Pt will come to EOB from flat bed with min A with extra time in prep for ADLs  Skilled Therapeutic Interventions/Progress Updates:      Therapy Documentation Precautions:  Precautions Precautions: Fall Precaution Comments: Parkinsons Restrictions Weight Bearing Restrictions: No General: "Have a great night!" Pt supine in bed upon OT arrival, agreeable to OT session. Wife present for session.  Pain: no pain reported  ADL: Bed mobility: Min A supine>EOB with assistance for trunk balance seated EOB, occasional leaning posterior to [employer] transfer: Min A with festinating gait with VC for increased stride length and RW safety Toileting: Max A, assistance for clothing management and hygiene seated on BSC over toilet, made BM Footwear: total A seated EOB donning tennis shoes, pt attempting task  but unable to complete with increased time Transfers: Min A for all transfers with increased time and festinating gait with RW, VC required for posture and increased stride length for all transfers  Exercises: Pt completed the following exercise circuit in order to improve functional activity, strength, posture and endurance to prepare for ADLs such as bathing. Pt completed the following exercises  in seated/standing position with no noted LOB/SOB and 3x10  repetitions on each exercise: -bicep curls with yellow theraband -triceps extensions with yellow theraband -forward punches  -shoulder shrugs and retraction/protraction -reaching up while looking up for increased neck stretching -cervical flexion/extension for decreased tightness  Initially pt seated EOB for exercises with Min A for trunk support. Moved to W/C for increased stability.   Other Treatments:   Pt seated in W/C at end of session with W/C alarm donned, call light within reach and 4Ps assessed.    Therapy/Group: Individual Therapy  [name], OTD, OTR/L 09/22/2023, 4:53 PM

## 2023-09-22 NOTE — Plan of Care (Signed)
  Problem: RH Balance Goal: LTG Patient will maintain dynamic standing balance (PT) Description: LTG:  Patient will maintain dynamic standing balance with assistance during mobility activities (PT) Flowsheets (Taken 09/22/2023 1242) LTG: Pt will maintain dynamic standing balance during mobility activities with:: Supervision/Verbal cueing   Problem: Sit to Stand Goal: LTG:  Patient will perform sit to stand with assistance level (PT) Description: LTG:  Patient will perform sit to stand with assistance level (PT) Flowsheets (Taken 09/22/2023 1242) LTG: PT will perform sit to stand in preparation for functional mobility with assistance level: Independent with assistive device   Problem: RH Bed Mobility Goal: LTG Patient will perform bed mobility with assist (PT) Description: LTG: Patient will perform bed mobility with assistance, with/without cues (PT). Flowsheets (Taken 09/22/2023 1242) LTG: Pt will perform bed mobility with assistance level of: Supervision/Verbal cueing   Problem: RH Bed to Chair Transfers Goal: LTG Patient will perform bed/chair transfers w/assist (PT) Description: LTG: Patient will perform bed to chair transfers with assistance (PT). Flowsheets (Taken 09/22/2023 1242) LTG: Pt will perform Bed to Chair Transfers with assistance level: Supervision/Verbal cueing   Problem: RH Car Transfers Goal: LTG Patient will perform car transfers with assist (PT) Description: LTG: Patient will perform car transfers with assistance (PT). Flowsheets (Taken 09/22/2023 1242) LTG: Pt will perform car transfers with assist:: Minimal Assistance - Patient > 75%   Problem: RH Ambulation Goal: LTG Patient will ambulate in controlled environment (PT) Description: LTG: Patient will ambulate in a controlled environment, # of feet with assistance (PT). Flowsheets (Taken 09/22/2023 1242) LTG: Pt will ambulate in controlled environ  assist needed:: Contact Guard/Touching assist LTG: Ambulation  distance in controlled environment: 119ft Goal: LTG Patient will ambulate in home environment (PT) Description: LTG: Patient will ambulate in home environment, # of feet with assistance (PT). Flowsheets (Taken 09/22/2023 1242) LTG: Pt will ambulate in home environ  assist needed:: Contact Guard/Touching assist LTG: Ambulation distance in home environment: 80ft   Problem: RH Stairs Goal: LTG Patient will ambulate up and down stairs w/assist (PT) Description: LTG: Patient will ambulate up and down # of stairs with assistance (PT) Flowsheets (Taken 09/22/2023 1242) LTG: Pt will ambulate up/down stairs assist needed:: Contact Guard/Touching assist LTG: Pt will  ambulate up and down number of stairs: 4 with 2 rails

## 2023-09-22 NOTE — Evaluation (Signed)
Physical Therapy Assessment and Plan  Patient Details  Name: Walter [name] MRN: [id number] Date of Birth: Aug 01, 1946  PT Diagnosis: Abnormal posture, Abnormality of gait, Cognitive deficits, Difficulty walking, and Muscle weakness Rehab Potential: Good ELOS: 7-9 days   Today's Date: 09/22/2023 PT Individual Time: 4098-1191 PT Individual Time Calculation (min): 60 min    Hospital Problem: Principal Problem:   Debility   Past Medical History:  Past Medical History:  Diagnosis Date   Allergy    Cancer (HCC)    Skin cancer   Cataract    Coronary artery disease    Diabetes mellitus    Diverticulosis    Esophageal stricture    GERD (gastroesophageal reflux disease)    History of hiatal hernia    Hyperlipidemia    Hypertension    Peripheral vascular disease (HCC)    Past Surgical History:  Past Surgical History:  Procedure Laterality Date   ABDOMINAL AORTIC ENDOVASCULAR STENT GRAFT N/A 09/16/2023   Procedure: ABDOMINAL AORTA ANEURYSM STENT USING COOK 32 X STENT AND ENDO-ANCHORS X 7;  Surgeon: Maeola Harman, MD;  Location: [facility] OR;  Service: Vascular;  Laterality: N/A;   ANEURYSM COILING Right 09/16/2023   Procedure: COILING TO RIGHT COMMON ILIAC;  Surgeon: Maeola Harman, MD;  Location: [facility] OR;  Service: Vascular;  Laterality: Right;   BIOPSY  03/07/2023   Procedure: BIOPSY;  Surgeon: Beverley Fiedler, MD;  Location: [facility] ENDOSCOPY;  Service: Gastroenterology;;   BYPASS GRAFT FEMORAL-PERONEAL Left 09/16/2023   Procedure: LEFT EXTERNAL ILIAC TO COMMON FEMORAL BYPASS USING HEMASHIELD GOLD GRAFT;  Surgeon: Maeola Harman, MD;  Location: [facility] OR;  Service: Vascular;  Laterality: Left;   CATARACT EXTRACTION Bilateral    COLONOSCOPY WITH PROPOFOL N/A 03/07/2023   Procedure: COLONOSCOPY WITH PROPOFOL;  Surgeon: Beverley Fiedler, MD;  Location: [facility] ENDOSCOPY;  Service: Gastroenterology;  Laterality: N/A;   CORONARY ANGIOPLASTY WITH STENT PLACEMENT   2004   ENDARTERECTOMY Left 09/16/2023   Procedure: LEFT ILIAC ENDARTERECTOMY;  Surgeon: Maeola Harman, MD;  Location: [facility] OR;  Service: Vascular;  Laterality: Left;   ENDARTERECTOMY FEMORAL Bilateral 09/16/2023   Procedure: BILATERAL FEMORAL ENDARTERECTOMY;  Surgeon: Maeola Harman, MD;  Location: [facility] OR;  Service: Vascular;  Laterality: Bilateral;   ESOPHAGOGASTRODUODENOSCOPY  06/07/2012   Procedure: ESOPHAGOGASTRODUODENOSCOPY (EGD);  Surgeon: Hart Carwin, MD;  Location: [facility] ENDOSCOPY;  Service: Endoscopy;  Laterality: N/A;   ESOPHAGOGASTRODUODENOSCOPY (EGD) WITH PROPOFOL N/A 03/07/2023   Procedure: ESOPHAGOGASTRODUODENOSCOPY (EGD) WITH PROPOFOL;  Surgeon: Beverley Fiedler, MD;  Location: [facility] ENDOSCOPY;  Service: Gastroenterology;  Laterality: N/A;   FEMORAL-FEMORAL BYPASS GRAFT Bilateral 09/16/2023   Procedure: LEFT TO RIGHT BYPASS GRAFT FEMORAL-FEMORAL ARTERY USING HEMSHIELD GOLD GRAFT;  Surgeon: Maeola Harman, MD;  Location: [facility] OR;  Service: Vascular;  Laterality: Bilateral;   HOT HEMOSTASIS N/A 03/07/2023   Procedure: HOT HEMOSTASIS (ARGON PLASMA COAGULATION/BICAP);  Surgeon: Beverley Fiedler, MD;  Location: [facility] ENDOSCOPY;  Service: Gastroenterology;  Laterality: N/A;   IR KYPHO LUMBAR INC FX REDUCE BONE BX UNI/BIL CANNULATION INC/IMAGING  03/02/2023   IVC FILTER INSERTION N/A 03/09/2023   Procedure: IVC FILTER INSERTION;  Surgeon: Victorino Sparrow, MD;  Location: [facility] INVASIVE CV LAB;  Service: Cardiovascular;  Laterality: N/A;   POLYPECTOMY  03/07/2023   Procedure: POLYPECTOMY;  Surgeon: Beverley Fiedler, MD;  Location: [facility] ENDOSCOPY;  Service: Gastroenterology;;   SHOULDER SURGERY  11/01/2004    Assessment & Plan Clinical Impression: Patient is a 77 year old  male was initially admitted to the hospital in May 2024 and found to have an 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  He was severely deconditioned and also had concomitant pulmonary embolus and  ultimately underwent IVC filter placement due to intolerance of Eliquis with GI bleeding.  He underwent preoperative cardiac clearance with stress testing and was moderate risk for general anesthesia.  On 09/16/2023 he was taken to the operating room and underwent endovascular repair of abdominal aortic aneurysm with left common iliac to left common femoral artery bypass and stent placement to left external iliac artery.  He was transfused 3 units of packed red blood cells for surgical losses.  He was transferred to intensive care on peripheral vasopressors, acute kidney injury and acidosis.  He was transfused additional unit of packed red blood cells.  IV fluid resuscitation continued.  Sliding scale insulin coverage for prediabetes.  He has a history of dementia on Namenda. Weaned off vasopressors and hemoglobin stabilized.  He was transferred to the stepdown unit on 11/18.  Serum creatinine continues downward trend.  Tolerating diet.The patient requires inpatient medicine and rehabilitation evaluations and services for ongoing dysfunction secondary to debility.  Patient transferred to CIR on 09/21/2023 .   Patient currently requires min with mobility secondary to muscle weakness and muscle joint tightness, decreased cardiorespiratoy endurance, decreased problem solving and decreased safety awareness, and decreased standing balance, decreased postural control, and decreased balance strategies.  Prior to hospitalization, patient was modified independent  with mobility and lived with Spouse in a House home.  Home access is 1Level entry.  Patient will benefit from skilled PT intervention to maximize safe functional mobility, minimize fall risk, and decrease caregiver burden for planned discharge home with 24 hour supervision.  Anticipate patient will benefit from follow up HH at discharge.  PT - End of Session Activity Tolerance: Tolerates 10 - 20 min activity with multiple rests Endurance Deficit: Yes PT  Assessment Rehab Potential (ACUTE/IP ONLY): Good PT Barriers to Discharge: Lack of/limited family support;Home environment access/layout;Insurance for SNF coverage PT Patient demonstrates impairments in the following area(s): Balance;Endurance;Motor PT Transfers Functional Problem(s): Bed Mobility;Car;Bed to Chair PT Locomotion Functional Problem(s): Ambulation;Stairs PT Plan PT Intensity: Minimum of 1-2 x/day ,45 to 90 minutes PT Frequency: 5 out of 7 days PT Duration Estimated Length of Stay: 7-9 days PT Treatment/Interventions: Ambulation/gait training;Cognitive remediation/compensation;Discharge planning;DME/adaptive equipment instruction;Functional mobility training;Psychosocial support;Splinting/orthotics;Therapeutic Activities;UE/LE Strength taining/ROM;Wheelchair propulsion/positioning;UE/LE Coordination activities;Therapeutic Exercise;Stair training;Patient/family education;Neuromuscular re-education;Skin care/wound management;Functional electrical stimulation;Disease management/prevention;Community reintegration;Balance/vestibular training PT Transfers Anticipated Outcome(s): supervision PT Locomotion Anticipated Outcome(s): Supervision PT Recommendation Recommendations for Other Services: Neuropsych consult Follow Up Recommendations: Home health PT;24 hour supervision/assistance Patient destination: Home Equipment Recommended: To be determined   PT Evaluation Precautions/Restrictions Precautions Precautions: Fall Precaution Comments: Parkinsons Restrictions Weight Bearing Restrictions: No Pain Interference Pain Interference Pain Effect on Sleep: 0. Does not apply - I have not had any pain or hurting in the past 5 days Pain Interference with Therapy Activities: 0. Does not apply - I have not received rehabilitationtherapy in the past 5 days Pain Interference with Day-to-Day Activities: 1. Rarely or not at all Home Living/Prior Functioning Home Living Available Help at  Discharge: Family;Available PRN/intermittently Type of Home: House Home Access: Level entry Entrance Stairs-Number of Steps: 1 Home Layout: Two level;Able to live on main level with bedroom/bathroom Alternate Level Stairs-Number of Steps: 3 Alternate Level Stairs-Rails: Right;Left;Can reach both Bathroom Shower/Tub: Engineer, manufacturing systems: Standard  Lives With: Spouse Prior Function Level of Independence:  Requires assistive device for independence  Able to Take Stairs?: Yes Driving: No Vocation: Retired Gaffer: Auditor Vision/Perception  Vision - History Ability to See in Adequate Light: 0 Adequate Perception Perception: Within Functional Limits Praxis Praxis: WFL Praxis Impairment Details: Motor planning;Initiation Praxis-Other Comments: motor impersistance  Cognition Overall Cognitive Status: History of cognitive impairments - at baseline Arousal/Alertness: Awake/alert Orientation Level: Oriented X4 Attention: Focused;Sustained;Selective Focused Attention: Appears intact Sustained Attention: Appears intact Selective Attention: Appears intact Memory: Appears intact Awareness: Impaired Awareness Impairment: Emergent impairment Problem Solving: Appears intact Safety/Judgment: Appears intact Sensation Sensation Light Touch: Appears Intact Hot/Cold: Appears Intact Proprioception: Appears Intact Stereognosis: Appears Intact Coordination Gross Motor Movements are Fluid and Coordinated: No Fine Motor Movements are Fluid and Coordinated: No Coordination and Movement Description: Festination with turns Motor  Motor Motor: Other (comment);Abnormal postural alignment and control Motor - Skilled Clinical Observations: Generalized weakness and deconditioning   Trunk/Postural Assessment  Cervical Assessment Cervical Assessment: Exceptions to [facility] (forward head) Thoracic Assessment Thoracic Assessment: Exceptions to [facility] (kyphosis) Lumbar  Assessment Lumbar Assessment: Exceptions to [facility] (posterior tilt) Postural Control Postural Control: Deficits on evaluation Trunk Control: posterior bias Righting Reactions: Delayed Protective Responses: delayed  Balance Balance Balance Assessed: Yes Dynamic Sitting Balance Dynamic Sitting - Balance Support: During functional activity Dynamic Sitting - Level of Assistance: 2: Max assist Sitting balance - Comments: Intermittent min assist, posterior lean Static Standing Balance Static Standing - Balance Support: During functional activity Static Standing - Level of Assistance: 3: Mod assist Dynamic Standing Balance Dynamic Standing - Balance Support: During functional activity Dynamic Standing - Level of Assistance: 3: Mod assist Extremity Assessment  RUE Assessment RUE Assessment: Within Functional Limits LUE Assessment LUE Assessment: Within Functional Limits RLE Assessment RLE Assessment: Exceptions to [facility] General Strength Comments: Grossly 4/5 LLE Assessment LLE Assessment: Exceptions to [facility] General Strength Comments: Grossly 4/5  Care Tool Care Tool Bed Mobility Roll left and right activity        Sit to lying activity        Lying to sitting on side of bed activity   Lying to sitting on side of bed assist level: the ability to move from lying on the back to sitting on the side of the bed with no back support.: Moderate Assistance - Patient 50 - 74%     Care Tool Transfers Sit to stand transfer   Sit to stand assist level: Minimal Assistance - Patient > 75%    Chair/bed transfer   Chair/bed transfer assist level: Minimal Assistance - Patient > 75%    Car transfer   Car transfer assist level: Minimal Assistance - Patient > 75%      Care Tool Locomotion Ambulation   Assist level: Minimal Assistance - Patient > 75% Assistive device: Walker-rolling Max distance: 200  Walk 10 feet activity   Assist level: Minimal Assistance - Patient > 75% Assistive device:  Walker-rolling   Walk 50 feet with 2 turns activity   Assist level: Minimal Assistance - Patient > 75% Assistive device: Walker-rolling  Walk 150 feet activity   Assist level: Minimal Assistance - Patient > 75% Assistive device: Walker-rolling  Walk 10 feet on uneven surfaces activity   Assist level: Minimal Assistance - Patient > 75% Assistive device: Walker-rolling  Stairs   Assist level: Minimal Assistance - Patient > 75% Stairs assistive device: 2 hand rails Max number of stairs: 12  Walk up/down 1 step activity   Walk up/down 1 step (curb) assist level: Minimal Assistance -  Patient > 75% Walk up/down 1 step or curb assistive device: 2 hand rails  Walk up/down 4 steps activity   Walk up/down 4 steps assist level: Minimal Assistance - Patient > 75% Walk up/down 4 steps assistive device: 2 hand rails  Walk up/down 12 steps activity   Walk up/down 12 steps assist level: Minimal Assistance - Patient > 75% Walk up/down 12 steps assistive device: 2 hand rails  Pick up small objects from floor   Pick up small object from the floor assist level: Minimal Assistance - Patient > 75%    Wheelchair Is the patient using a wheelchair?: No          Wheel 50 feet with 2 turns activity      Wheel 150 feet activity        Refer to Care Plan for Long Term Goals  SHORT TERM GOAL WEEK 1 PT Short Term Goal 1 (Week 1): STG = LTG due to ELOS  Recommendations for other services: Neuropsych  Skilled Therapeutic Intervention Mobility Transfers Transfers: Sit to Stand;Stand to [employer] Transfers Sit to Stand: Minimal Assistance - Patient > 75%;Contact Guard/Touching assist Stand to Sit: Minimal Assistance - Patient > 75%;Contact Guard/Touching assist Stand Pivot Transfers: Minimal Assistance - Patient > 75% Stand Pivot Transfer Details: Verbal cues for gait pattern;Verbal cues for technique;Verbal cues for precautions/safety;Verbal cues for safe use of DME/AE;Tactile cues for  posture Transfer (Assistive device): Rolling walker Locomotion  Gait Ambulation: Yes Gait Assistance: Minimal Assistance - Patient > 75% Gait Distance (Feet): 150 Feet Assistive device: Rolling walker Gait Assistance Details: Verbal cues for safe use of DME/AE;Verbal cues for precautions/safety;Verbal cues for technique;Verbal cues for gait pattern;Tactile cues for posture Gait Gait: Yes Gait Pattern: Impaired Gait Pattern: Festinating;Poor foot clearance - right;Poor foot clearance - left;Trunk flexed Stairs / Additional Locomotion Stairs: Yes Stairs Assistance: Minimal Assistance - Patient > 75% Stair Management Technique: Two rails;Alternating pattern;Forwards Number of Stairs: 12 Height of Stairs: 6 Wheelchair Mobility Wheelchair Mobility: No   Skilled Intervention: Pt sitting in w/c to start and agreeable to PT assessment. Has no c/o pain, oriented x4 and able to provide PLOF and social factors. Transported patient to ortho rehab gym at w/c level. Sit<>Stand to RW with CGA/minA. Completed ambulatory car transfer with minA and RW with assist needed for managing BLE in and out of vehicle - able to reposition self without assist otherwise. Gait training on unlevel surface with the ramp, needing minA and RW - more of a festinating gait while navigating this type of terrain.   In main rehab gym, worked on Investment banker, operational with vs without the RW. He ambulated 266ft with minA and RW with a festinating gait pattern and forward flexed trunk, more of a festination with turns than when walking in straight line. Looking at the shoe wear from his shoes, this is likely premorbid and typical for him. Gait training up to 160ft distances with no AD and minA with similar deficits, slightly worse trunk flexion.   Stair training using 6" steps and 2 hand rails - able to navigate up/down x12 of them with reciprocal stepping pattern for both directions, minA for safety and cues for making sure full foot on  the step before proceeding.   Functional outcome measures completed (TUG and 5xSTS) - both with no AD or RW support.  TUG - 34.5 seconds *Scores >15 seconds indicate increased falls risk 5xSTS - 20.5 seconds *scores > 13.5 seconds indicate increased falls risk.   Pt  returned to his room and requesting to lie down 2/2 fatigue. CGA/minA for stand pivot transfer back to bed. Bed mobility completed with minA for BLE support. Alarm on, safety plan updated.   Instructed pt in results of PT evaluation as detailed above, PT POC, rehab potential, rehab goals, and discharge recommendations. Additionally discussed CIR's policies regarding fall safety and use of chair alarm and/or quick release belt. Pt verbalized understanding and in agreement. Will update pt's family members as they become available.    Discharge Criteria: Patient will be discharged from PT if patient refuses treatment 3 consecutive times without medical reason, if treatment goals not met, if there is a change in medical status, if patient makes no progress towards goals or if patient is discharged from hospital.  The above assessment, treatment plan, treatment alternatives and goals were discussed and mutually agreed upon: by patient  Walter [name]  PT, DPT, CSRS  09/22/2023, 10:32 AM

## 2023-09-22 NOTE — Evaluation (Signed)
Occupational Therapy Assessment and Plan  Patient Details  Name: Walter [name] MRN: [id number] Date of Birth: [birth year]  OT Diagnosis: abnormal posture and muscle weakness (generalized) Rehab Potential: Rehab Potential (ACUTE ONLY): Good ELOS: ~20 days   Today's Date: 09/22/2023 OT Individual Time: 4098-1191 OT Individual Time Calculation (min): 60 min     Hospital Problem: Principal Problem:   Debility   Past Medical History:  Past Medical History:  Diagnosis Date   Allergy    Cancer (HCC)    Skin cancer   Cataract    Coronary artery disease    Diabetes mellitus    Diverticulosis    Esophageal stricture    GERD (gastroesophageal reflux disease)    History of hiatal hernia    Hyperlipidemia    Hypertension    Peripheral vascular disease (HCC)    Past Surgical History:  Past Surgical History:  Procedure Laterality Date   ABDOMINAL AORTIC ENDOVASCULAR STENT GRAFT N/A 09/16/2023   Procedure: ABDOMINAL AORTA ANEURYSM STENT USING COOK 32 X STENT AND ENDO-ANCHORS X 7;  Surgeon: Maeola Harman, MD;  Location: [facility] OR;  Service: Vascular;  Laterality: N/A;   ANEURYSM COILING Right 09/16/2023   Procedure: COILING TO RIGHT COMMON ILIAC;  Surgeon: Maeola Harman, MD;  Location: [facility] OR;  Service: Vascular;  Laterality: Right;   BIOPSY  03/07/2023   Procedure: BIOPSY;  Surgeon: Beverley Fiedler, MD;  Location: [facility] ENDOSCOPY;  Service: Gastroenterology;;   BYPASS GRAFT FEMORAL-PERONEAL Left 09/16/2023   Procedure: LEFT EXTERNAL ILIAC TO COMMON FEMORAL BYPASS USING HEMASHIELD GOLD GRAFT;  Surgeon: Maeola Harman, MD;  Location: [facility] OR;  Service: Vascular;  Laterality: Left;   CATARACT EXTRACTION Bilateral    COLONOSCOPY WITH PROPOFOL N/A 03/07/2023   Procedure: COLONOSCOPY WITH PROPOFOL;  Surgeon: Beverley Fiedler, MD;  Location: [facility] ENDOSCOPY;  Service: Gastroenterology;  Laterality: N/A;   CORONARY ANGIOPLASTY WITH STENT PLACEMENT  2004    ENDARTERECTOMY Left 09/16/2023   Procedure: LEFT ILIAC ENDARTERECTOMY;  Surgeon: Maeola Harman, MD;  Location: [facility] OR;  Service: Vascular;  Laterality: Left;   ENDARTERECTOMY FEMORAL Bilateral 09/16/2023   Procedure: BILATERAL FEMORAL ENDARTERECTOMY;  Surgeon: Maeola Harman, MD;  Location: [facility] OR;  Service: Vascular;  Laterality: Bilateral;   ESOPHAGOGASTRODUODENOSCOPY  06/07/2012   Procedure: ESOPHAGOGASTRODUODENOSCOPY (EGD);  Surgeon: Hart Carwin, MD;  Location: [facility] ENDOSCOPY;  Service: Endoscopy;  Laterality: N/A;   ESOPHAGOGASTRODUODENOSCOPY (EGD) WITH PROPOFOL N/A 03/07/2023   Procedure: ESOPHAGOGASTRODUODENOSCOPY (EGD) WITH PROPOFOL;  Surgeon: Beverley Fiedler, MD;  Location: [facility] ENDOSCOPY;  Service: Gastroenterology;  Laterality: N/A;   FEMORAL-FEMORAL BYPASS GRAFT Bilateral 09/16/2023   Procedure: LEFT TO RIGHT BYPASS GRAFT FEMORAL-FEMORAL ARTERY USING HEMSHIELD GOLD GRAFT;  Surgeon: Maeola Harman, MD;  Location: [facility] OR;  Service: Vascular;  Laterality: Bilateral;   HOT HEMOSTASIS N/A 03/07/2023   Procedure: HOT HEMOSTASIS (ARGON PLASMA COAGULATION/BICAP);  Surgeon: Beverley Fiedler, MD;  Location: [facility] ENDOSCOPY;  Service: Gastroenterology;  Laterality: N/A;   IR KYPHO LUMBAR INC FX REDUCE BONE BX UNI/BIL CANNULATION INC/IMAGING  03/02/2023   IVC FILTER INSERTION N/A 03/09/2023   Procedure: IVC FILTER INSERTION;  Surgeon: Victorino Sparrow, MD;  Location: [facility] INVASIVE CV LAB;  Service: Cardiovascular;  Laterality: N/A;   POLYPECTOMY  03/07/2023   Procedure: POLYPECTOMY;  Surgeon: Beverley Fiedler, MD;  Location: [facility] ENDOSCOPY;  Service: Gastroenterology;;   SHOULDER SURGERY  11/01/2004    Assessment & Plan Clinical Impression: Patient is a 77 y.o.  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  He was severely deconditioned and also had concomitant pulmonary embolus and  ultimately underwent IVC filter placement due to intolerance of Eliquis with GI bleeding.  He underwent preoperative cardiac clearance with stress testing and was moderate risk for general anesthesia.  On 09/16/2023 he was taken to the operating room and underwent endovascular repair of abdominal aortic aneurysm with left common iliac to left common femoral artery bypass and stent placement to left external iliac artery.  He was transfused 3 units of packed red blood cells for surgical losses.  He was transferred to intensive care on peripheral vasopressors, acute kidney injury and acidosis.  He was transfused additional unit of packed red blood cells.  IV fluid resuscitation continued.  Sliding scale insulin coverage for prediabetes.  He has a history of dementia on Namenda. Weaned off vasopressors and hemoglobin stabilized.  He was transferred to the stepdown unit on 11/18.  Serum creatinine continues downward trend.  Tolerating diet.The patient requires inpatient medicine and rehabilitation evaluations and services for ongoing dysfunction secondary to debility. Patient transferred to CIR on 09/21/2023 .    Patient currently requires max with basic self-care skills and for functional mobility  secondary to muscle weakness, decreased cardiorespiratoy endurance, impaired timing and sequencing, unbalanced muscle activation, decreased coordination, and decreased motor planning, decreased midline orientation, decreased initiation and decreased memory, and decreased sitting balance, decreased standing balance, decreased postural control, and decreased balance strategies.  Prior to hospitalization, patient could complete ADL with supervision.  Patient will benefit from skilled intervention to decrease level of assist with basic self-care skills and increase independence with basic self-care skills prior to discharge home with care partner.  Anticipate patient will require 24 hour supervision and follow up home  health.  OT - End of Session Activity Tolerance: Tolerates 30+ min activity with multiple rests Endurance Deficit: Yes OT Assessment Rehab Potential (ACUTE ONLY): Good OT Patient demonstrates impairments in the following area(s): Balance;Cognition;Edema;Endurance;Motor;Pain;Perception;Safety OT Basic ADL's Functional Problem(s): Grooming;Bathing;Dressing;Toileting OT Transfers Functional Problem(s): Toilet;Tub/Shower OT Additional Impairment(s): Fuctional Use of Upper Extremity OT Plan OT Intensity: Minimum of 1-2 x/day, 45 to 90 minutes OT Frequency: 5 out of 7 days OT Duration/Estimated Length of Stay: ~20 days OT Treatment/Interventions: Balance/vestibular training;Disease mangement/prevention;Neuromuscular re-education;Self Care/advanced ADL retraining;Therapeutic Exercise;Cognitive remediation/compensation;DME/adaptive equipment instruction;Pain management;Skin care/wound managment;UE/LE Strength taining/ROM;Community reintegration;Functional electrical stimulation;Patient/family education;Splinting/orthotics;UE/LE Coordination activities;Discharge planning;Functional mobility training;Psychosocial support;Therapeutic Activities OT Self Feeding Anticipated Outcome(s): n/a OT Basic Self-Care Anticipated Outcome(s): supervision OT Toileting Anticipated Outcome(s): supervision OT Bathroom Transfers Anticipated Outcome(s): supervision OT Recommendation Patient destination: Home Follow Up Recommendations: Home health OT Equipment Recommended: To be determined   OT Evaluation Precautions/Restrictions  Precautions Precautions: Fall Precaution Comments: Parkinsons Restrictions Weight Bearing Restrictions: No General Chart Reviewed: Yes Family/Caregiver Present: No Vital Signs   Pain   Home Living/Prior Functioning Home Living Family/patient expects to be discharged to:: Private residence Living Arrangements: Spouse/significant other Available Help at Discharge: Family,  Available PRN/intermittently Type of Home: House Home Access: Level entry Entrance Stairs-Number of Steps: 1 Home Layout: Two level, Able to live on main level with bedroom/bathroom Alternate Level Stairs-Number of Steps: 3 Alternate Level Stairs-Rails: Right, Left, Can reach both Bathroom Shower/Tub: Engineer, manufacturing systems: Standard  Lives With: Spouse Prior Function Level of Independence: Requires assistive device for independence  Able to Take Stairs?: Yes Driving: No Vocation: Retired Gaffer: Auditor Vision  Baseline Vision/History: 0 No visual deficits Ability to See in Adequate Light: 0 Adequate Patient Visual Report: No change from baseline Vision Assessment?: No apparent visual deficits Perception  Perception: Within Functional Limits Praxis Praxis: WFL Praxis Impairment Details: Motor planning;Initiation Praxis-Other Comments: motor impersistance Cognition Cognition Overall Cognitive Status: History of cognitive impairments - at baseline Arousal/Alertness: Awake/alert Orientation Level: Person;Place;Situation Person: Oriented Place: Oriented Situation: Oriented Memory: Appears intact Attention: Focused;Sustained;Selective Focused Attention: Appears intact Sustained Attention: Appears intact Selective Attention: Appears intact Awareness: Impaired Awareness Impairment: Emergent impairment Problem Solving: Appears intact Safety/Judgment: Appears intact Brief Interview for Mental Status (BIMS) Repetition of Three Words (First Attempt): 3 Temporal Orientation: Year: Correct Temporal Orientation: Month: Accurate within 5 days Temporal Orientation: Day: Incorrect Recall: "Sock": Yes, no cue required Recall: "Blue": Yes, after cueing ("a color") Recall: "Bed": Yes, no cue required BIMS Summary Score: 13 Sensation Sensation Light Touch: Appears Intact Hot/Cold: Appears Intact Proprioception: Appears Intact Stereognosis: Appears  Intact Coordination Gross Motor Movements are Fluid and Coordinated: No Fine Motor Movements are Fluid and Coordinated: No Coordination and Movement Description: Festination with turns Motor  Motor Motor: Other (comment);Abnormal postural alignment and control Motor - Skilled Clinical Observations: Generalized weakness and deconditioning  Trunk/Postural Assessment  Cervical Assessment Cervical Assessment: Exceptions to [facility] (forward head) Thoracic Assessment Thoracic Assessment: Exceptions to [facility] (kyphosis) Lumbar Assessment Lumbar Assessment: Exceptions to [facility] (posterior tilt) Postural Control Postural Control: Deficits on evaluation Trunk Control: posterior bias Righting Reactions: Delayed Protective Responses: delayed  Balance Balance Balance Assessed: Yes Dynamic Sitting Balance Dynamic Sitting - Balance Support: During functional activity Dynamic Sitting - Level of Assistance: 2: Max assist Sitting balance - Comments: Intermittent min assist, posterior lean Static Standing Balance Static Standing - Balance Support: During functional activity Static Standing - Level of Assistance: 3: Mod assist Dynamic Standing Balance Dynamic Standing - Balance Support: During functional activity Dynamic Standing - Level of Assistance: 3: Mod assist Extremity/Trunk Assessment RUE Assessment RUE Assessment: Within Functional Limits LUE Assessment LUE Assessment: Within Functional Limits  Care Tool Care Tool Self Care Eating    Didn't observe    Oral Care    Oral Care Assist Level: Minimal Assistance - Patient > 75%    Bathing   Body parts bathed by patient: Right arm;Left arm;Chest;Abdomen;Right upper leg;Left upper leg;Face Body parts bathed by helper: Front perineal area;Buttocks;Right lower leg;Left lower leg   Assist Level: Moderate Assistance - Patient 50 - 74%    Upper Body Dressing(including orthotics)   What is the patient wearing?: Pull over shirt   Assist Level:  Total Assistance - Patient < 25%    Lower Body Dressing (excluding footwear)   What is the patient wearing?: Incontinence brief;Pants Assist for lower body dressing: Total Assistance - Patient < 25%    Putting on/Taking off footwear   What is the patient wearing?: Ted hose;Shoes;Socks Assist for footwear: Total Assistance - Patient < 25%       Care Tool Toileting Toileting activity    Not attempted     Care Tool Bed Mobility Roll left and right activity    N/a    Sit to lying activity    N/a     Lying to sitting on side of bed activity   Lying to sitting on side of bed assist level: the ability to move from lying on the back to sitting on the side of the bed with no back support.: Moderate Assistance - Patient 50 - 74%     Care Tool  Transfers Sit to stand transfer   Sit to stand assist level: Minimal Assistance - Patient > 75%    Chair/bed transfer   Chair/bed transfer assist level: Minimal Assistance - Patient > 75%     Toilet transfer   Assist Level: Moderate Assistance - Patient 50 - 74%     Care Tool Cognition  Expression of Ideas and Wants Expression of Ideas and Wants: 3. Some difficulty - exhibits some difficulty with expressing needs and ideas (e.g, some words or finishing thoughts) or speech is not clear  Understanding Verbal and Non-Verbal Content Understanding Verbal and Non-Verbal Content: 3. Usually understands - understands most conversations, but misses some part/intent of message. Requires cues at times to understand   Memory/Recall Ability     Refer to Care Plan for Long Term Goals  SHORT TERM GOAL WEEK 1 OT Short Term Goal 1 (Week 1): Pt will don shirt with mod A OT Short Term Goal 2 (Week 1): Pt will perform sit to stand with close supervision in prep for LB clothing management OT Short Term Goal 3 (Week 1): Pt will performing 1 grooming tasks in standing wtih min A for balance OT Short Term Goal 4 (Week 1): Pt will thread pants with mod A with  extra time OT Short Term Goal 5 (Week 1): Pt will come to EOB from flat bed with min A with extra time in prep for ADLs  Recommendations for other services: None    Skilled Therapeutic Intervention 1:1 OT eval initiated with OT purpose, role and goals discussed with pt. Self care retraining at shower level. Pt required mod A to come to EOB with difficulty with sitting balance. Pt leaning to the right - pt requires multimodal cues to come back to midline to maintain balance but difficulty with maintaining it. Pt transferred to w/c with RW with mod A. Use of RW to help promote a forward weight shift. Pt then ambulated to the bathroom with RW with max A for cues for stepping pattern and for RW management during turning. Pt able to perform sit to stands in shower with grab bar. Pt transferred stand pivot with mod A to w/c to then dress.  Pt required A to thread long sleeve shirt as well as all the other parts. Pt attempted to help thread but difficulty with getting down to his feet. Assisted pt with shaving today.  Pt left in w/c with seat belt.   ADL ADL Grooming: Maximal assistance Upper Body Bathing: Minimal assistance Where Assessed-Upper Body Bathing: Shower Lower Body Bathing: Maximal assistance Where Assessed-Lower Body Bathing: Shower Upper Body Dressing: Maximal assistance Where Assessed-Upper Body Dressing: Wheelchair Lower Body Dressing: Maximal assistance Where Assessed-Lower Body Dressing: Engineer, mining Equipment: Walk in shower;Transfer tub bench;Grab bars Film/video editor: Moderate assistance Film/video editor Method: Ambulating Mobility  Transfers Sit to Stand: Minimal Assistance - Patient > 75%;Contact Guard/Touching assist Stand to Sit: Minimal Assistance - Patient > 75%;Contact Guard/Touching assist   Discharge Criteria: Patient will be discharged from OT if patient refuses treatment 3 consecutive times without medical reason, if treatment goals not  met, if there is a change in medical status, if patient makes no progress towards goals or if patient is discharged from hospital.  The above assessment, treatment plan, treatment alternatives and goals were discussed and mutually agreed upon: by patient  Adan Sis 09/22/2023, 10:50 AM

## 2023-09-22 NOTE — Progress Notes (Signed)
Inpatient Rehabilitation Admission Medication Review by a Pharmacist  A complete drug regimen review was completed for this patient to identify any potential clinically significant medication issues.  High Risk Drug Classes Is patient taking? Indication by Medication  Antipsychotic No   Anticoagulant No   Antibiotic No   Opioid Yes Oxycodone - prn pain  Antiplatelet Yes Aspirin - PVD  Hypoglycemics/insulin Yes Aspart - hyperglycemia   Vasoactive Medication No   Chemotherapy No   Other Yes Atorvastatin - HLD Docusate - constipation Memantine - dementia Pantoprazole - GERD Tylenol - prn pain Maalox - prn indigestion Bisacodyl, Miralax, Fleet - prn constipation Benadryl - prn itching Robitussin - prn cough Melatonin - prn sleep Methocarbamol - prn muscle spasms Ondansetron - prn N/V     Type of Medication Issue Identified Description of Issue Recommendation(s)  Drug Interaction(s) (clinically significant)     Duplicate Therapy     Allergy     No Medication Administration End Date     Incorrect Dose     Additional Drug Therapy Needed     Significant med changes from prior encounter (inform family/care partners about these prior to discharge). PTA medications not resumed: Amlodipine, vitamin B12, MVI, ferrous sulfate, losartan, metformin Consider resuming PTA medications during CIR admit or upon discharge as appropriate    Other       Clinically significant medication issues were identified that warrant physician communication and completion of prescribed/recommended actions by midnight of the next day:  No  Name of provider notified for urgent issues identified:   Provider Method of Notification:   Pharmacist comments:   Time spent performing this drug regimen review (minutes):  20   Loura Back, PharmD, BCPS Clinical Pharmacist Clinical phone for 09/22/2023 is [phone] 09/22/2023 7:23 AM

## 2023-09-22 NOTE — Plan of Care (Signed)
  Problem: RH Balance Goal: LTG: Patient will maintain dynamic sitting balance (OT) Description: LTG:  Patient will maintain dynamic sitting balance with assistance during activities of daily living (OT) Flowsheets (Taken 09/22/2023 0950) LTG: Pt will maintain dynamic sitting balance during ADLs with: Supervision/Verbal cueing Goal: LTG Patient will maintain dynamic standing with ADLs (OT) Description: LTG:  Patient will maintain dynamic standing balance with assist during activities of daily living (OT)  Flowsheets (Taken 09/22/2023 0950) LTG: Pt will maintain dynamic standing balance during ADLs with: Supervision/Verbal cueing   Problem: Sit to Stand Goal: LTG:  Patient will perform sit to stand in prep for activites of daily living with assistance level (OT) Description: LTG:  Patient will perform sit to stand in prep for activites of daily living with assistance level (OT) Flowsheets (Taken 09/22/2023 0950) LTG: PT will perform sit to stand in prep for activites of daily living with assistance level: Supervision/Verbal cueing   Problem: RH Grooming Goal: LTG Patient will perform grooming w/assist,cues/equip (OT) Description: LTG: Patient will perform grooming with assist, with/without cues using equipment (OT) Flowsheets (Taken 09/22/2023 0950) LTG: Pt will perform grooming with assistance level of: Supervision/Verbal cueing   Problem: RH Bathing Goal: LTG Patient will bathe all body parts with assist levels (OT) Description: LTG: Patient will bathe all body parts with assist levels (OT) Flowsheets (Taken 09/22/2023 0950) LTG: Pt will perform bathing with assistance level/cueing: Supervision/Verbal cueing   Problem: RH Dressing Goal: LTG Patient will perform upper body dressing (OT) Description: LTG Patient will perform upper body dressing with assist, with/without cues (OT). Flowsheets (Taken 09/22/2023 0950) LTG: Pt will perform upper body dressing with assistance level of:  Supervision/Verbal cueing Goal: LTG Patient will perform lower body dressing w/assist (OT) Description: LTG: Patient will perform lower body dressing with assist, with/without cues in positioning using equipment (OT) Flowsheets (Taken 09/22/2023 0950) LTG: Pt will perform lower body dressing with assistance level of: Supervision/Verbal cueing   Problem: RH Toileting Goal: LTG Patient will perform toileting task (3/3 steps) with assistance level (OT) Description: LTG: Patient will perform toileting task (3/3 steps) with assistance level (OT)  Flowsheets (Taken 09/22/2023 0950) LTG: Pt will perform toileting task (3/3 steps) with assistance level: Supervision/Verbal cueing   Problem: RH Toilet Transfers Goal: LTG Patient will perform toilet transfers w/assist (OT) Description: LTG: Patient will perform toilet transfers with assist, with/without cues using equipment (OT) Flowsheets (Taken 09/22/2023 0950) LTG: Pt will perform toilet transfers with assistance level of: Supervision/Verbal cueing   Problem: RH Tub/Shower Transfers Goal: LTG Patient will perform tub/shower transfers w/assist (OT) Description: LTG: Patient will perform tub/shower transfers with assist, with/without cues using equipment (OT) Flowsheets (Taken 09/22/2023 0950) LTG: Pt will perform tub/shower stall transfers with assistance level of: Contact Guard/Touching assist

## 2023-09-22 NOTE — Progress Notes (Signed)
Inpatient Rehabilitation Center Individual Statement of Services  Patient Name:  Neven Ficara  Date:  09/22/2023  Welcome to the Inpatient Rehabilitation Center.  Our goal is to provide you with an individualized program based on your diagnosis and situation, designed to meet your specific needs.  With this comprehensive rehabilitation program, you will be expected to participate in at least 3 hours of rehabilitation therapies Monday-Friday, with modified therapy programming on the weekends.  Your rehabilitation program will include the following services:  Physical Therapy (PT), Occupational Therapy (OT), 24 hour per day rehabilitation nursing, Therapeutic Recreaction (TR), Care Coordinator, Rehabilitation Medicine, Nutrition Services, and Pharmacy Services  Weekly team conferences will be held on Tuesday to discuss your progress.  Your Inpatient Rehabilitation Care Coordinator will talk with you frequently to get your input and to update you on team discussions.  Team conferences with you and your family in attendance may also be held.  Expected length of stay: 7-9 days  Overall anticipated outcome: supervision-CGA level  Depending on your progress and recovery, your program may change. Your Inpatient Rehabilitation Care Coordinator will coordinate services and will keep you informed of any changes. Your Inpatient Rehabilitation Care Coordinator's name and contact numbers are listed  below.  The following services may also be recommended but are not provided by the Inpatient Rehabilitation Center:   Home Health Rehabiltiation Services Outpatient Rehabilitation Services    Arrangements will be made to provide these services after discharge if needed.  Arrangements include referral to agencies that provide these services.  Your insurance has been verified to be:  [employer] Your primary doctor is:  Government social research officer  Pertinent information will be shared with your doctor and your  insurance company.  Inpatient Rehabilitation Care Coordinator:  Dossie Der, [place] [phone] or Luna Glasgow  Information discussed with and copy given to patient by: Lucy Chris, 09/22/2023, 1:23 PM

## 2023-09-22 NOTE — Progress Notes (Addendum)
PROGRESS NOTE   Subjective/Complaints:  Pt reports no pain LBM yesterday Peeing ok Slept OK.   Hb 7.3 down from 8.8 on 11/19- was 6.6 on 11/17 s/p transfusion  Plts 129 up from 86k- coming up.  K+ 3.4 Cr 1.19- down from 1.29  Of note, took Iron at home- will restart  Had routine colonoscopy in May 2024.    ROS:  Pt denies SOB, abd pain, CP, N/V/C/D, and vision changes  Objective:   No results found. Recent Labs    09/20/23 1001 09/22/23 0521  WBC 9.8 7.5  HGB 8.8* 7.3*  HCT 26.7* 22.8*  PLT 86* 129*   Recent Labs    09/20/23 1001 09/22/23 0521  NA 138 140  K 4.0 3.4*  CL 108 111  CO2 21* 24  GLUCOSE 189* 130*  BUN 26* 25*  CREATININE 1.29* 1.19  CALCIUM 7.9* 7.8*    Intake/Output Summary (Last 24 hours) at 09/22/2023 4098 Last data filed at 09/22/2023 0800 Gross per 24 hour  Intake 358 ml  Output --  Net 358 ml        Physical Exam: Vital Signs Blood pressure (!) 153/65, pulse 81, temperature 98 F (36.7 C), resp. rate 16, height 5\' 7"  (1.702 m), weight 66.7 kg, SpO2 97%.    General: awake, alert, appropriate, NAD HENT: conjugate gaze; oropharynx moist CV: regular rate and rhythm; no JVD Pulmonary: CTA B/L; no W/R/R- good air movement GI: soft, NT, ND, (+)BS- almost flat Psychiatric: appropriate Neurological: alert Skin; Abdominal incision glued- looks good- no drainage- only localized erythema around glue   Assessment/Plan: 1. Functional deficits which require 3+ hours per day of interdisciplinary therapy in a comprehensive inpatient rehab setting. Physiatrist is providing close team supervision and 24 hour management of active medical problems listed below. Physiatrist and rehab team continue to assess barriers to discharge/monitor patient progress toward functional and medical goals  Care Tool:  Bathing              Bathing assist       Upper Body  Dressing/Undressing Upper body dressing        Upper body assist      Lower Body Dressing/Undressing Lower body dressing            Lower body assist       Toileting Toileting    Toileting assist       Transfers Chair/bed transfer  Transfers assist           Locomotion Ambulation   Ambulation assist              Walk 10 feet activity   Assist           Walk 50 feet activity   Assist           Walk 150 feet activity   Assist           Walk 10 feet on uneven surface  activity   Assist           Wheelchair     Assist               Wheelchair 50 feet with 2  turns activity    Assist            Wheelchair 150 feet activity     Assist          Blood pressure (!) 153/65, pulse 81, temperature 98 F (36.7 C), resp. rate 16, height 5\' 7"  (1.702 m), weight 66.7 kg, SpO2 97%.  Medical Problem List and Plan: Debility following AAA repair             -patient may shower             -ELOS/Goals: S 10-14 days             Admit to CIR   First day of evaluations- con't CIR PT and OT  Of note, Hb has dropped 1.5 units since 2 days ago.  2.  Antithrombotics: -DVT/anticoagulation:  Mechanical: Sequential compression devices, below knee Bilateral lower extremities 11/21- not on Lovenox due to drop in Hb as well as low plts             -antiplatelet therapy: Aspirin 81 mg daily   3. Pain Management: Tylenol as needed   4. Mood/Behavior/Sleep: LCSW to evaluate and provide emotional support             -antipsychotic agents: n/a   5. Neuropsych/cognition: This patient is not capable of making decisions on his own behalf. Due to dementia   6. Skin/Wound Care: Routine skin care checks   7. Fluids/Electrolytes/Nutrition: Routine Is and Os and follow-up chemistries             -dysphagia 3 diet with thin liquids/carb modified   8: Hypertension: monitor TID and prn             -home losartan 100 mg daily  and amlodipine 5 mg daily held   9: 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 Randie Heinz.             -continue aspirin             -follow-up [facility]   10: AKI: improving              -follow-up BMP   11: ABLA/chronic anemia: downward drift in H and H over last 48 hours             -follow-up CBC   11/21- Hb down to 7.3 from 8.8- in 2 days- will recheck in AM- check FOBT daily x 3 days, start Fe sulfate 325 mg qday.  Had colonoscopy in Spring 2024- no issues- will also call Vascular to inform them -they don't want any additional imaging at this time.  12: Prediabetes: home metformin 1000 mg held             -continue SSI   11/21- well controlled- con't regimen- will restart Metformin 500 mg BID- since renal function doing better- wife odesn't want him receiving insulin by d/c (he's only on SSI).  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 Hypokalemia  11/21- K+ 3.4- will replete with 40 mEq x1 and recheck in AM  I spent a total of 51   minutes on total care today- >50% coordination of care- due to  D/w PA as well as [facility] about pt's drop in Hb- - called 2 different PA's from [facility];  also spoke with nursing- made changes for low K+; Anemia; wait to start Lovenox due to drop in Hb.     LOS: 1 days A FACE TO FACE EVALUATION WAS PERFORMED  Walter [name] 09/22/2023, 8:33 AM

## 2023-09-22 NOTE — Progress Notes (Signed)
Inpatient Rehabilitation  Patient information reviewed and entered into eRehab system by Oyuki Hogan M. Eri Mcevers, M.A., CCC/SLP, PPS Coordinator.  Information including medical coding, functional ability and quality indicators will be reviewed and updated through discharge.    

## 2023-09-22 NOTE — Progress Notes (Signed)
Inpatient Rehabilitation Care Coordinator Assessment and Plan Patient Details  Name: Walter [name] MRN: [id number] Date of Birth: [birth year]  Today's Date: 09/22/2023  Hospital Problems: Principal Problem:   Debility  Past Medical History:  Past Medical History:  Diagnosis Date   Allergy    Cancer [facility])    Skin cancer   Cataract    Coronary artery disease    Diabetes mellitus    Diverticulosis    Esophageal stricture    GERD (gastroesophageal reflux disease)    History of hiatal hernia    Hyperlipidemia    Hypertension    Peripheral vascular disease (HCC)    Past Surgical History:  Past Surgical History:  Procedure Laterality Date   ABDOMINAL AORTIC ENDOVASCULAR STENT GRAFT N/A 09/16/2023   Procedure: ABDOMINAL AORTA ANEURYSM STENT USING COOK 32 X STENT AND ENDO-ANCHORS X 7;  Surgeon: Maeola Harman, MD;  Location: [facility] OR;  Service: Vascular;  Laterality: N/A;   ANEURYSM COILING Right 09/16/2023   Procedure: COILING TO RIGHT COMMON ILIAC;  Surgeon: Maeola Harman, MD;  Location: [facility] OR;  Service: Vascular;  Laterality: Right;   BIOPSY  03/07/2023   Procedure: BIOPSY;  Surgeon: Beverley Fiedler, MD;  Location: [facility] ENDOSCOPY;  Service: Gastroenterology;;   BYPASS GRAFT FEMORAL-PERONEAL Left 09/16/2023   Procedure: LEFT EXTERNAL ILIAC TO COMMON FEMORAL BYPASS USING HEMASHIELD GOLD GRAFT;  Surgeon: Maeola Harman, MD;  Location[facility] OR;  Service: Vascular;  Laterality: Left;   CATARACT EXTRACTION Bilateral    COLONOSCOPY WITH PROPOFOL N/A 03/07/2023   Procedure: COLONOSCOPY WITH PROPOFOL;  Surgeon: Beverley Fiedler, MD;  Location: [facility] ENDOSCOPY;  Service: Gastroenterology;  Laterality: N/A;   CORONARY ANGIOPLASTY WITH STENT PLACEMENT  2004   ENDARTERECTOMY Left 09/16/2023   Procedure: LEFT ILIAC ENDARTERECTOMY;  Surgeon: Maeola Harman, MD;  Location: [facility] OR;  Service: Vascular;  Laterality: Left;   ENDARTERECTOMY FEMORAL  Bilateral 09/16/2023   Procedure: BILATERAL FEMORAL ENDARTERECTOMY;  Surgeon: Maeola Harman, MD;  Location: [facility] OR;  Service: Vascular;  Laterality: Bilateral;   ESOPHAGOGASTRODUODENOSCOPY  06/07/2012   Procedure: ESOPHAGOGASTRODUODENOSCOPY (EGD);  Surgeon: Hart Carwin, MD;  Location: [facility] ENDOSCOPY;  Service: Endoscopy;  Laterality: N/A;   ESOPHAGOGASTRODUODENOSCOPY (EGD) WITH PROPOFOL N/A 03/07/2023   Procedure: ESOPHAGOGASTRODUODENOSCOPY (EGD) WITH PROPOFOL;  Surgeon: Beverley Fiedler, MD;  Location: [facility] ENDOSCOPY;  Service: Gastroenterology;  Laterality: N/A;   FEMORAL-FEMORAL BYPASS GRAFT Bilateral 09/16/2023   Procedure: LEFT TO RIGHT BYPASS GRAFT FEMORAL-FEMORAL ARTERY USING HEMSHIELD GOLD GRAFT;  Surgeon: Maeola Harman, MD;  Location: [facility] OR;  Service: Vascular;  Laterality: Bilateral;   HOT HEMOSTASIS N/A 03/07/2023   Procedure: HOT HEMOSTASIS (ARGON PLASMA COAGULATION/BICAP);  Surgeon: Beverley Fiedler, MD;  Location: [facility] ENDOSCOPY;  Service: Gastroenterology;  Laterality: N/A;   IR KYPHO LUMBAR INC FX REDUCE BONE BX UNI/BIL CANNULATION INC/IMAGING  03/02/2023   IVC FILTER INSERTION N/A 03/09/2023   Procedure: IVC FILTER INSERTION;  Surgeon: Victorino Sparrow, MD;  Location: [facility] INVASIVE CV LAB;  Service: Cardiovascular;  Laterality: N/A;   POLYPECTOMY  03/07/2023   Procedure: POLYPECTOMY;  Surgeon: Beverley Fiedler, MD;  Location: [facility] ENDOSCOPY;  Service: Gastroenterology;;   SHOULDER SURGERY  11/01/2004   Social History:  reports that he quit smoking about 23 years ago. His smoking use included cigarettes. He has never used smokeless tobacco. He reports that he does not drink alcohol and does not use drugs.  Family / Support Systems Marital Status: Married Patient  Roles: Spouse, Parent, Other (Comment) (grandfather) Spouse/Significant Other: Rhunette Croft [phone] Other Supports: Betty-sister [phone]  Mindy-granddaughter [phone] Anticipated Caregiver:  Rhunette Croft Ability/Limitations of Caregiver: Supervision-light min Caregiver Availability: 24/7 Family Dynamics: Close knit with family and extended family. He and wife are active in their church and members check on one another.  Social History Preferred language: English Religion:  Cultural Background: No issues Education: HS Health Literacy - How often do you need to have someone help you when you read instructions, pamphlets, or other written material from your doctor or pharmacy?: Never Writes: Yes Employment Status: Retired Marine scientist Issues: No issues Guardian/Conservator: None-according to MD pt is not fully capable of making his own decisions will look toward his wife for any decisions needed while here   Abuse/Neglect Abuse/Neglect Assessment Can Be Completed: Yes Physical Abuse: Denies Verbal Abuse: Denies Sexual Abuse: Denies Exploitation of patient/patient's resources: Denies Self-Neglect: Denies  Patient response to: Social Isolation - How often do you feel lonely or isolated from those around you?: Never  Emotional Status Pt's affect, behavior and adjustment status: Pt is motivated to get back his strength and recover from surgery. He was here in May 2024 and the MD's found the AAA he had but needed him to recover from his PE and gain some strength before having the surgery which he has this time. He was mobile, wife needed to assist with tub transfer prior to admission Recent Psychosocial Issues: other health issues dealing with since 03/2023 Psychiatric History: No history does have a history of dementia and is on namedia which helps, wife confirms this Substance Abuse History: NO issues  Patient / Family Perceptions, Expectations & Goals Pt/Family understanding of illness & functional limitations: Pt and wife can explain his surgery and issues as a result, both talk with the MD's involved and feel have a good understanding of his plan moving forward.  Wife is somewhat upset he is on insulin since wss not on at home and wants MD to be aware of this. Informed her will mesaage MD to ask this question Premorbid pt/family roles/activities: husband, father, grandfather, retiree, church member, etc Anticipated changes in roles/activities/participation: resume Pt/family expectations/goals: Pt states: " I hope to do well here and get stronger."  Wife states: " I hope he does well I can only do so much for him."  [employer]: Other (Comment) (been to [facility] in 03/2023) Premorbid Home Care/DME Agencies: Other (Comment) (rollator, bsc, grab bars and shower seat) Transportation available at discharge: wife Is the patient able to respond to transportation needs?: Yes In the past 12 months, has lack of transportation kept you from medical appointments or from getting medications?: No In the past 12 months, has lack of transportation kept you from meetings, work, or from getting things needed for daily living?: No  Discharge Planning Living Arrangements: Spouse/significant other Support Systems: Spouse/significant other, Children, Other relatives, Friends/neighbors, Church/faith community Type of Residence: Private residence Insurance Resources: Media planner (specify) Administrator Medicare) Financial Resources: Social Security, Family Support Financial Screen Referred: No Living Expenses: Own Money Management: Patient, Spouse Does the patient have any problems obtaining your medications?: No Home Management: wife Patient/Family Preliminary Plans: Return home with wife who can provide some assist but is limited also due to her own health issues. Aware being evaluated today and goals being set for stay here. Care Coordinator Barriers to Discharge: Decreased caregiver support Care Coordinator Anticipated Follow Up Needs: HH/OP  Clinical Impression Pleasant gentleman who is motivated to  do well and will do his best with his  other health issues. His wife is involved and will assist if can. Will update on Tuesday after team conference and work on discharge needs. MD made aware of wife's request he not go home on insulin and back on his oral agent.  Lucy Chris 09/22/2023, 1:22 PM

## 2023-09-23 DIAGNOSIS — R5381 Other malaise: Secondary | ICD-10-CM | POA: Diagnosis not present

## 2023-09-23 LAB — BASIC METABOLIC PANEL
Anion gap: 4 — ABNORMAL LOW (ref 5–15)
BUN: 22 mg/dL (ref 8–23)
CO2: 26 mmol/L (ref 22–32)
Calcium: 7.9 mg/dL — ABNORMAL LOW (ref 8.9–10.3)
Chloride: 111 mmol/L (ref 98–111)
Creatinine, Ser: 1.06 mg/dL (ref 0.61–1.24)
GFR, Estimated: >60 mL/min (ref >60)
Glucose, Bld: 127 mg/dL — ABNORMAL HIGH (ref 70–99)
Potassium: 3.9 mmol/L (ref 3.5–5.1)
Sodium: 141 mmol/L (ref 135–145)

## 2023-09-23 LAB — CBC WITH DIFFERENTIAL/PLATELET
Abs Immature Granulocytes: 0.37 10*3/uL — ABNORMAL HIGH (ref 0.00–0.07)
Basophils Absolute: 0 10*3/uL (ref 0.0–0.1)
Basophils Relative: 0 %
Eosinophils Absolute: 0.2 10*3/uL (ref 0.0–0.5)
Eosinophils Relative: 3 %
HCT: 23.7 % — ABNORMAL LOW (ref 39.0–52.0)
Hemoglobin: 7.5 g/dL — ABNORMAL LOW (ref 13.0–17.0)
Immature Granulocytes: 5 %
Lymphocytes Relative: 10 %
Lymphs Abs: 0.9 10*3/uL (ref 0.7–4.0)
MCH: 29.9 pg (ref 26.0–34.0)
MCHC: 31.6 g/dL (ref 30.0–36.0)
MCV: 94.4 fL (ref 80.0–100.0)
Monocytes Absolute: 0.6 10*3/uL (ref 0.1–1.0)
Monocytes Relative: 7 %
Neutro Abs: 6.3 10*3/uL (ref 1.7–7.7)
Neutrophils Relative %: 75 %
Platelets: 160 10*3/uL (ref 150–400)
RBC: 2.51 MIL/uL — ABNORMAL LOW (ref 4.22–5.81)
RDW: 13.7 % (ref 11.5–15.5)
WBC: 8.3 10*3/uL (ref 4.0–10.5)
nRBC: 0 % (ref 0.0–0.2)

## 2023-09-23 LAB — GLUCOSE, CAPILLARY
Glucose-Capillary: 118 mg/dL — ABNORMAL HIGH (ref 70–99)
Glucose-Capillary: 190 mg/dL — ABNORMAL HIGH (ref 70–99)
Glucose-Capillary: 114 mg/dL — ABNORMAL HIGH (ref 70–99)
Glucose-Capillary: 131 mg/dL — ABNORMAL HIGH (ref 70–99)

## 2023-09-23 MED ORDER — AMLODIPINE BESYLATE 5 MG PO TABS
5.0000 mg | ORAL_TABLET | Freq: Every day | ORAL | Status: DC
  Administered 2023-09-23: 5 mg via ORAL
  Filled 2023-09-23: qty 1

## 2023-09-23 NOTE — Plan of Care (Signed)
  Problem: Consults Goal: RH GENERAL PATIENT EDUCATION Description: See Patient Education module for education specifics. Outcome: Progressing   Problem: RH BOWEL ELIMINATION Goal: RH STG MANAGE BOWEL WITH ASSISTANCE Description: STG Manage Bowel with min Assistance. Outcome: Progressing Goal: RH STG MANAGE BOWEL W/MEDICATION W/ASSISTANCE Description: STG Manage Bowel with Medication with min Assistance. Outcome: Progressing   Problem: RH BLADDER ELIMINATION Goal: RH STG MANAGE BLADDER WITH ASSISTANCE Description: STG Manage Bladder With min Assistance Outcome: Progressing   Problem: RH SKIN INTEGRITY Goal: RH STG SKIN FREE OF INFECTION/BREAKDOWN Description: Incisions will continue to heal and skin tears will improve without additional breakdown with min assist Outcome: Progressing Goal: RH STG MAINTAIN SKIN INTEGRITY WITH ASSISTANCE Description: STG Maintain Skin Integrity With min Assistance. Outcome: Progressing Goal: RH STG ABLE TO PERFORM INCISION/WOUND CARE W/ASSISTANCE Description: STG Able To Perform Incision/Wound Care With min Assistance. Outcome: Progressing   Problem: RH SAFETY Goal: RH STG ADHERE TO SAFETY PRECAUTIONS W/ASSISTANCE/DEVICE Description: STG Adhere to Safety Precautions With min Assistance/Device. Outcome: Progressing Goal: RH STG DECREASED RISK OF FALL WITH ASSISTANCE Description: STG Decreased Risk of Fall With min Assistance. Outcome: Progressing   Problem: RH PAIN MANAGEMENT Goal: RH STG PAIN MANAGED AT OR BELOW PT'S PAIN GOAL Description: Less than 4 with PRN medications min assist  Outcome: Progressing   Problem: RH KNOWLEDGE DEFICIT GENERAL Goal: RH STG INCREASE KNOWLEDGE OF SELF CARE AFTER HOSPITALIZATION Description: Patient will be able to manage medications, self care and diet/lifestyle modifications to improve overall health from nursing education and nursing handouts independently  Outcome: Progressing

## 2023-09-23 NOTE — Progress Notes (Signed)
  Progress Note    09/23/2023 7:11 AM   Subjective:  pt sitting in WC at sink working with therapy.  Says he is doing great.  Denies any pain in his legs or feet.    Tm 99 now afebrile  Vitals:   09/22/23 1956 09/23/23 0442  BP: (!) 148/58 (!) 156/75  Pulse: 87 81  Resp: 16 18  Temp: 99 F (37.2 C) 98.2 F (36.8 C)  SpO2: 97% 98%    Physical Exam: General:  no distress Lungs:  non labored   CBC    Component Value Date/Time   WBC 7.5 09/22/2023 0521   RBC 2.48 (L) 09/22/2023 0521   HGB 7.3 (L) 09/22/2023 0521   HGB 11.3 (L) 04/18/2023 0816   HCT 22.8 (L) 09/22/2023 0521   HCT 36.1 (L) 04/18/2023 0816   PLT 129 (L) 09/22/2023 0521   PLT 227 04/18/2023 0816   MCV 91.9 09/22/2023 0521   MCV 86 04/18/2023 0816   MCH 29.4 09/22/2023 0521   MCHC 32.0 09/22/2023 0521   RDW 13.9 09/22/2023 0521   RDW 18.1 (H) 04/18/2023 0816   LYMPHSABS 1.0 09/22/2023 0521   LYMPHSABS 1.2 04/18/2023 0816   MONOABS 0.6 09/22/2023 0521   EOSABS 0.2 09/22/2023 0521   EOSABS 0.2 04/18/2023 0816   BASOSABS 0.0 09/22/2023 0521   BASOSABS 0.0 04/18/2023 0816    BMET    Component Value Date/Time   NA 140 09/22/2023 0521   K 3.4 (L) 09/22/2023 0521   CL 111 09/22/2023 0521   CO2 24 09/22/2023 0521   GLUCOSE 130 (H) 09/22/2023 0521   GLUCOSE 127 (H) 08/17/2006 1059   BUN 25 (H) 09/22/2023 0521   CREATININE 1.19 09/22/2023 0521   CALCIUM 7.8 (L) 09/22/2023 0521   GFRNONAA >60 09/22/2023 0521   GFRAA >60 01/07/2019 0751    INR    Component Value Date/Time   INR 1.4 (H) 09/16/2023 1620     Intake/Output Summary (Last 24 hours) at 09/23/2023 0711 Last data filed at 09/23/2023 0559 Gross per 24 hour  Intake 738 ml  Output 375 ml  Net 363 ml      Assessment/Plan:  77 y.o. male is s/p:  Coil right CIA, stent left EIA, left CIA to left CFA BPG with dacron with associated left CIA and left CFA endarterectomy, repair of AAA with Cook stent into the left CIA and left to right  femoral to femoral bypass with right CFA endarterectomy on 09/16/2023 by Dr. Randie Heinz     -pt working with therapy this morning.  He denies any pain in his legs or feet.   -continue asa/statin -will check back later to look at his incisions.      Doreatha Massed, PA-C [facility] [phone] 09/23/2023 7:11 AM

## 2023-09-23 NOTE — Progress Notes (Signed)
Physical Therapy Session Note  Patient Details  Name: Walter [name] MRN: [id number] Date of Birth: 19-Feb-1946  Today's Date: 09/23/2023 PT Individual Time: 1000-1115 PT Individual Time Calculation (min): 75 min   Short Term Goals: Week 1:  PT Short Term Goal 1 (Week 1): STG = LTG due to ELOS  Skilled Therapeutic Interventions/Progress Updates:    pt received in bed and agreeable to therapy. No complaint of pain.   Bed mobility with supervision. Sit to stand and gait with CGA overall. Pt with festinating gait pattern that significantly worsened at ~ 50 ft. Cues for upright posture and longer steps, required frequent cueing and reset to get closer to RW and take increase stride length.   Nustep for global endurance and reciprocal motion, cues for longer strides and even pacing. Short rest breaks between intervals as listed below.  Level 7 x 4 min, cues for full/longer stride length Level 4 x 2 min with cues for "sprint" pace, intermittent cueing to maintain pace Repeated x 3. Used meaningful music for improving rhythm and improved participation.  Dynamic gait training including stepping over yoga blocks and turning around cones. Pt required frequent cueing and resets for festinating gait pattern. Pt was able to clear yoga blocks with CGA but required min a at times for shuffling and increased RW distance. Added more obstacles for reciprocal step over pattern, which pt is able to perform with CGA, but difficulty applying without actual obstacle during gait for more than ~4 steps.   Ambulated back to room with CGA and min A, significantly improved step length with less frequent cues required. Pt returned to bed with increased time and supervision. Pt was left with all needs in reach and alarm active.    Therapy Documentation Precautions:  Precautions Precautions: Fall Precaution Comments: Parkinsons Restrictions Weight Bearing Restrictions: No General:       Therapy/Group:  Individual Therapy  Juluis Rainier 09/23/2023, 10:22 AM

## 2023-09-23 NOTE — IPOC Note (Signed)
Overall Plan of Care [facility]) Patient Details Name: Walter [name] MRN: [id number] DOB: [birth year]  Admitting Diagnosis: Debility  Hospital Problems: Principal Problem:   Debility     Functional Problem List: Nursing Bladder, Bowel, Endurance, Medication Management, Safety, Skin Integrity  PT Balance, Endurance, Motor  OT Balance, Cognition, Edema, Endurance, Motor, Pain, Perception, Safety  SLP    TR         Basic ADL's: OT Grooming, Bathing, Dressing, Toileting     Advanced  ADL's: OT       Transfers: PT Bed Mobility, Car, Bed to Chair  OT Toilet, Tub/Shower     Locomotion: PT Ambulation, Stairs     Additional Impairments: OT Fuctional Use of Upper Extremity  SLP        TR      Anticipated Outcomes Item Anticipated Outcome  Self Feeding n/a  Swallowing      Basic self-care  supervision  Toileting  supervision   Bathroom Transfers supervision  Bowel/Bladder  maintain continence and regular pattern of voiding and BM  Transfers  supervision  Locomotion  Supervision  Communication     Cognition     Pain  Less than 4  Safety/Judgment  No falls, skin breakdown, or infection while on rehab   Therapy Plan: PT Intensity: Minimum of 1-2 x/day ,45 to 90 minutes PT Frequency: 5 out of 7 days PT Duration Estimated Length of Stay: 7-9 days OT Intensity: Minimum of 1-2 x/day, 45 to 90 minutes OT Frequency: 5 out of 7 days OT Duration/Estimated Length of Stay: ~20 days     Team Interventions: Nursing Interventions Patient/Family Education, Medication Management, Bladder Management, Bowel Management, Skin Care/Wound Management, Pain Management, Discharge Planning  PT interventions Ambulation/gait training, Cognitive remediation/compensation, Discharge planning, DME/adaptive equipment instruction, Functional mobility training, Psychosocial support, Splinting/orthotics, Therapeutic Activities, UE/LE Strength taining/ROM, Wheelchair propulsion/positioning,  UE/LE Coordination activities, Therapeutic Exercise, Stair training, Patient/family education, Neuromuscular re-education, Skin care/wound management, Functional electrical stimulation, Disease management/prevention, Firefighter, Warden/ranger  OT Interventions Warden/ranger, Disease mangement/prevention, Neuromuscular re-education, Self Care/advanced ADL retraining, Therapeutic Exercise, Cognitive remediation/compensation, DME/adaptive equipment instruction, Pain management, Skin care/wound managment, UE/LE Strength taining/ROM, Community reintegration, Functional electrical stimulation, Patient/family education, Splinting/orthotics, UE/LE Coordination activities, Discharge planning, Functional mobility training, Psychosocial support, Therapeutic Activities  SLP Interventions    TR Interventions    SW/CM Interventions Discharge Planning, Psychosocial Support, Patient/Family Education   Barriers to Discharge MD  Medical stability, Home enviroment access/loayout, Wound care, Lack of/limited family support, Weight bearing restrictions, and dementia  Nursing Decreased caregiver support, Home environment access/layout, Incontinence, Wound Care lives with wife in 2 level with B/B on main level with 1 ste entry no HR  PT Lack of/limited family support, Home environment Best boy, Community education officer for SNF coverage    OT      SLP      SW Decreased caregiver support     Team Discharge Planning: Destination: PT-Home ,OT- Home , SLP-  Projected Follow-up: PT-Home health PT, 24 hour supervision/assistance, OT-  Home health OT, SLP-  Projected Equipment Needs: PT-To be determined, OT- To be determined, SLP-  Equipment Details: PT- , OT-  Patient/family involved in discharge planning: PT- Patient,  OT-Patient, Family member/caregiver, SLP-   MD ELOS: 10-15 days- OT wanted 20 days, but PT wanted 7-9 days Medical Rehab Prognosis:  Good Assessment: The patient has been  admitted for CIR therapies with the diagnosis of debility due to AAA repair and complications. The team will be addressing functional mobility, strength,  stamina, balance, safety, adaptive techniques and equipment, self-care, bowel and bladder mgt, patient and caregiver education, . Goals have been set at supervision. Anticipated discharge destination is home with wife.        See Team Conference Notes for weekly updates to the plan of care

## 2023-09-23 NOTE — Progress Notes (Signed)
Physical Therapy Session Note  Patient Details  Name: Walter [name] MRN: [id number] Date of Birth: [birth year]  Today's Date: 09/23/2023 PT Individual Time: 1300-1415 PT Individual Time Calculation (min): 75 min   Short Term Goals: Week 1:  PT Short Term Goal 1 (Week 1): STG = LTG due to ELOS  Skilled Therapeutic Interventions/Progress Updates: Pt presents semi-reclined in bed and agreeable to therapy, finishing use of urinal w/o success.  Pt bridges w/ cues for LE placement and able to pull up pants.  Pt transferred sup to sit w/ elevated HOB and min A.  Pt sat EOB for total A to don shoes, leaning back on UES.  Pt transfers sit to stand w/ Min A and cues for forward scoot and lean to increase WB to forefoot to avoid retropulsion.  Pt amb w/ RW and min A to small gym w/ festinating gait and fexed posture, cues to improve but then reverts to same.  Counting method improves gait for min period of time.  Pt performed sit to stand blocks x 5-10 w/ min A.  Pt requires seated rest breaks 2/2 fatigue.  Pt performs standing and placing/removing squigs from mirror using B UES standing on Airex cushion.  Retropulsion noted w/ minimal attempt to correct.  Pt amb up/down ramped surface w/ min A and cues.  Pt amb back to room to sit EOB.  W/c positioned next to bed and pt performed SPT w/ min A and cues.  Chair alarm on and all needs in reach.     Therapy Documentation Precautions:  Precautions Precautions: Fall Precaution Comments: Parkinsons Restrictions Weight Bearing Restrictions: No General:   Vital Signs:   Pain:0/10 Pain Assessment Pain Scale: 0-10 Pain Score: 7  Pain Type: Acute pain Pain Location: Neck Pain Intervention(s): Medication (See eMAR)    Therapy/Group: Individual Therapy  [name] 09/23/2023, 2:17 PM

## 2023-09-23 NOTE — Discharge Instructions (Addendum)
Inpatient Rehab Discharge Instructions  Delon Len Discharge date and time: 10/04/2023  Activities/Precautions/ Functional Status: Activity: no lifting, driving, or strenuous exercise until cleared by MD Diet: diabetic diet Wound Care: keep wound clean and dry Functional status:  ___ No restrictions     ___ Walk up steps independently _x__ 24/7 supervision/assistance   ___ Walk up steps with assistance ___ Intermittent supervision/assistance  ___ Bathe/dress independently ___ Walk with walker     ___ Bathe/dress with assistance ___ Walk Independently    ___ Shower independently ___ Walk with assistance    __x_ Shower with assistance _x__ No alcohol     ___ Return to work/school ________  Special Instructions: No driving, alcohol consumption or tobacco use.  Recommend checking fingerstick blood sugars four (4) times daily and record. Bring this information with you to follow-up appointment with PCP.  Recommend daily BP measurement in same arm and record time of day. Bring this information with you to follow-up appointment with PCP.   COMMUNITY REFERRALS UPON DISCHARGE:    Home Health:   PT   OT   RN                  Agency:[facility] Phone:[phone]    Medical Equipment/Items Ordered:HAS ALL FROM PREVIOUS ADMISSIONS                                                 Agency/Supplier:NA   My questions have been answered and I understand these instructions. I will adhere to these goals and the provided educational materials after my discharge from the hospital.  Patient/Caregiver Signature _______________________________ Date __________  Clinician Signature _______________________________________ Date __________  Please bring this form and your medication list with you to all your follow-up doctor's appointments.

## 2023-09-23 NOTE — Progress Notes (Signed)
PROGRESS NOTE   Subjective/Complaints:  Pt reports awake off and on since 3am when they woke him up.  Having neck pain- bothersome- hasn't had pain meds for it yet.    ROS:   Pt denies SOB, abd pain, CP, N/V/C/D, and vision changes  Objective:   No results found. Recent Labs    09/22/23 0521 09/23/23 0652  WBC 7.5 8.3  HGB 7.3* 7.5*  HCT 22.8* 23.7*  PLT 129* 160   Recent Labs    09/22/23 0521 09/23/23 0652  NA 140 141  K 3.4* 3.9  CL 111 111  CO2 24 26  GLUCOSE 130* 127*  BUN 25* 22  CREATININE 1.19 1.06  CALCIUM 7.8* 7.9*    Intake/Output Summary (Last 24 hours) at 09/23/2023 0804 Last data filed at 09/23/2023 0559 Gross per 24 hour  Intake 380 ml  Output 375 ml  Net 5 ml        Physical Exam: Vital Signs Blood pressure (!) 156/75, pulse 81, temperature 98.2 F (36.8 C), temperature source Oral, resp. rate 18, height 5\' 7"  (1.702 m), weight 66.7 kg, SpO2 98%.     General: awake, alert, appropriate,  supine in bed; just woke up; NAD HENT: conjugate gaze; oropharynx moist CV: regular rate irregular rhythm; no JVD Pulmonary: CTA B/L; no W/R/R- good air movement GI: soft, NT, ND, (+)BS- normoactive Psychiatric: appropriate- interactive Neurological: alert- but sleepy Skin; Abdominal incision glued- looks good- no drainage- only localized erythema around glue- looks good   Assessment/Plan: 1. Functional deficits which require 3+ hours per day of interdisciplinary therapy in a comprehensive inpatient rehab setting. Physiatrist is providing close team supervision and 24 hour management of active medical problems listed below. Physiatrist and rehab team continue to assess barriers to discharge/monitor patient progress toward functional and medical goals  Care Tool:  Bathing    Body parts bathed by patient: Right arm, Left arm, Chest, Abdomen, Right upper leg, Left upper leg, Face   Body  parts bathed by helper: Front perineal area, Buttocks, Right lower leg, Left lower leg     Bathing assist Assist Level: Moderate Assistance - Patient 50 - 74%     Upper Body Dressing/Undressing Upper body dressing   What is the patient wearing?: Pull over shirt    Upper body assist Assist Level: Total Assistance - Patient < 25%    Lower Body Dressing/Undressing Lower body dressing      What is the patient wearing?: Incontinence brief, Pants     Lower body assist Assist for lower body dressing: Total Assistance - Patient < 25%     Toileting Toileting    Toileting assist       Transfers Chair/bed transfer  Transfers assist     Chair/bed transfer assist level: Minimal Assistance - Patient > 75%     Locomotion Ambulation   Ambulation assist      Assist level: Minimal Assistance - Patient > 75% Assistive device: Walker-rolling Max distance: 200   Walk 10 feet activity   Assist     Assist level: Minimal Assistance - Patient > 75% Assistive device: Walker-rolling   Walk 50 feet activity   Assist  Assist level: Minimal Assistance - Patient > 75% Assistive device: Walker-rolling    Walk 150 feet activity   Assist    Assist level: Minimal Assistance - Patient > 75% Assistive device: Walker-rolling    Walk 10 feet on uneven surface  activity   Assist     Assist level: Minimal Assistance - Patient > 75% Assistive device: Walker-rolling   Wheelchair     Assist Is the patient using a wheelchair?: No             Wheelchair 50 feet with 2 turns activity    Assist            Wheelchair 150 feet activity     Assist          Blood pressure (!) 156/75, pulse 81, temperature 98.2 F (36.8 C), temperature source Oral, resp. rate 18, height 5\' 7"  (1.702 m), weight 66.7 kg, SpO2 98%.  Medical Problem List and Plan: Debility following AAA repair             -patient may shower             -ELOS/Goals: S 10-14  days             Con't CIR PT and OT  Limited by Hb as well as dementia 2.  Antithrombotics: -DVT/anticoagulation:  Mechanical: Sequential compression devices, below knee Bilateral lower extremities 11/21-11/22 not on Lovenox due to drop in Hb as well as low plts              -antiplatelet therapy: Aspirin 81 mg daily   3. Pain Management: Tylenol as needed   11/22- asked nursing to bring pain meds for neck pain 4. Mood/Behavior/Sleep: LCSW to evaluate and provide emotional support             -antipsychotic agents: n/a   5. Neuropsych/cognition: This patient is not capable of making decisions on his own behalf. Due to dementia   6. Skin/Wound Care: Routine skin care checks   7. 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 Randie Heinz.             -continue aspirin             -follow-up VVS   10: AKI: improving              -follow-up BMP   11/22- Cr down to 1.06- doing better 11: ABLA/chronic anemia: downward drift in H and H over last 48 hours             -follow-up CBC   11/21- Hb down to 7.3 from 8.8- in 2 days- will recheck in AM- check FOBT daily x 3 days, start Fe sulfate 325 mg qday.  Had colonoscopy in Spring 2024- no issues- will also call Vascular to inform them -they don't want any additional imaging at this time.   11/22- no BM to do hemoccult- also Hb up to 7.5 today- will  recheck Monday and f/u on this.  12: Prediabetes: home metformin 1000 mg held             -continue SSI   11/21- well controlled- con't regimen- will restart Metformin  500 mg BID- since renal function doing better- wife odesn't want him receiving insulin by d/c (he's only on SSI).   11/22- BG's runningd 118 to 180's- will con't Metformin- home dose 1000 mg BID- but will wait to increase for a few days.  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 Hypokalemia  11/21- K+ 3.4- will replete with 40 mEq x1 and recheck in AM  11/22- K+ up to 3.9 18. Thrombocytopenia  11/22- plts up to 160k from low of 86k- but will not restart Lovenox since Hb so low.  19. Constipation  11/22- LBM 2 days ago- if no BM by tomorrow, will need intervention  I spent a total of 37   minutes on total care today- >50% coordination of care- due to  Did IPOC_ d/w PA and nursing about bowels as well as review of labs and vitals- started meds for BP and DM.      LOS: 2 days A FACE TO FACE EVALUATION WAS PERFORMED  Stanely Sexson 09/23/2023, 8:04 AM

## 2023-09-23 NOTE — Progress Notes (Signed)
Occupational Therapy Session Note  Patient Details  Name: Walter [name] MRN: [id number] Date of Birth: 1946/01/11  Today's Date: 09/23/2023 OT Individual Time: 0800-0850 OT Individual Time Calculation (min): 50 min    Short Term Goals: Week 1:  OT Short Term Goal 1 (Week 1): Pt will don shirt with mod A OT Short Term Goal 2 (Week 1): Pt will perform sit to stand with close supervision in prep for LB clothing management OT Short Term Goal 3 (Week 1): Pt will performing 1 grooming tasks in standing wtih min A for balance OT Short Term Goal 4 (Week 1): Pt will thread pants with mod A with extra time OT Short Term Goal 5 (Week 1): Pt will come to EOB from flat bed with min A with extra time in prep for ADLs  Skilled Therapeutic Interventions/Progress Updates: Patient received resting in bed. Agreeable to OT treatment working on improving independence with ADL's. See below for full functional levels of ADL's performed. Patient needing increased time for all self care tasks performed from w/c. Patient with posterior lean when sitting EOB initially. Able to stand with min assist to the walker for transfers. VC's given for safety and to ensure patient reached for arm rests prior to sitting in the w/c. Good participation and motivation to gain independence with self care. Continue with skilled OT POC.     Therapy Documentation Precautions:  Precautions Precautions: Fall Precaution Comments: Parkinsons Restrictions Weight Bearing Restrictions: No General:   Vital Signs: Therapy Vitals Temp: 98.2 F (36.8 C) Temp Source: Oral Pulse Rate: 81 Resp: 18 BP: (!) 156/75 Patient Position (if appropriate): Lying Oxygen Therapy SpO2: 98 % O2 Device: Room Air Pain: reports some stiffness in his neck.   ADL: ADL Grooming: Min assistance Upper Body Bathing: Minimal assistance Where Assessed-Upper Body Bathing: sink Lower Body Bathing: Mod assistance Where Assessed-Lower Body Bathing:  sink Upper Body Dressing: Min assistance Where Assessed-Upper Body Dressing: Wheelchair Lower Body Dressing: Maximal assistance Where Assessed-Lower Body Dressing: Wheelchair Toileting: Max Research officer, political party transfer: Min assist with grab bars  Therapy/Group: Individual Therapy  Warnell Forester 09/23/2023, 12:10 PM

## 2023-09-24 DIAGNOSIS — R5381 Other malaise: Secondary | ICD-10-CM | POA: Diagnosis not present

## 2023-09-24 LAB — GLUCOSE, CAPILLARY
Glucose-Capillary: 108 mg/dL — ABNORMAL HIGH (ref 70–99)
Glucose-Capillary: 154 mg/dL — ABNORMAL HIGH (ref 70–99)
Glucose-Capillary: 97 mg/dL (ref 70–99)
Glucose-Capillary: 214 mg/dL — ABNORMAL HIGH (ref 70–99)

## 2023-09-24 MED ORDER — SORBITOL 70 % SOLN: 30.0000 mL | Freq: Once | Status: DC

## 2023-09-24 MED ORDER — AMLODIPINE BESYLATE 10 MG PO TABS
10.0000 mg | ORAL_TABLET | Freq: Every day | ORAL | Status: DC
  Administered 2023-09-24: 10 mg via ORAL
  Filled 2023-09-24: qty 1

## 2023-09-24 MED ORDER — AMLODIPINE BESYLATE 5 MG PO TABS
5.0000 mg | ORAL_TABLET | Freq: Every day | ORAL | Status: DC
  Administered 2023-09-25 – 2023-10-04 (×10): 5 mg via ORAL
  Filled 2023-09-24 (×10): qty 1

## 2023-09-24 NOTE — Plan of Care (Signed)
  Problem: Consults Goal: RH GENERAL PATIENT EDUCATION Description: See Patient Education module for education specifics. Outcome: Progressing   Problem: RH BOWEL ELIMINATION Goal: RH STG MANAGE BOWEL WITH ASSISTANCE Description: STG Manage Bowel with min Assistance. Outcome: Progressing Goal: RH STG MANAGE BOWEL W/MEDICATION W/ASSISTANCE Description: STG Manage Bowel with Medication with min Assistance. Outcome: Progressing   Problem: RH BLADDER ELIMINATION Goal: RH STG MANAGE BLADDER WITH ASSISTANCE Description: STG Manage Bladder With min Assistance Outcome: Progressing   Problem: RH SKIN INTEGRITY Goal: RH STG SKIN FREE OF INFECTION/BREAKDOWN Description: Incisions will continue to heal and skin tears will improve without additional breakdown with min assist Outcome: Progressing Goal: RH STG MAINTAIN SKIN INTEGRITY WITH ASSISTANCE Description: STG Maintain Skin Integrity With min Assistance. Outcome: Progressing Goal: RH STG ABLE TO PERFORM INCISION/WOUND CARE W/ASSISTANCE Description: STG Able To Perform Incision/Wound Care With min Assistance. Outcome: Progressing   Problem: RH SAFETY Goal: RH STG ADHERE TO SAFETY PRECAUTIONS W/ASSISTANCE/DEVICE Description: STG Adhere to Safety Precautions With min Assistance/Device. Outcome: Progressing Goal: RH STG DECREASED RISK OF FALL WITH ASSISTANCE Description: STG Decreased Risk of Fall With min Assistance. Outcome: Progressing   Problem: RH PAIN MANAGEMENT Goal: RH STG PAIN MANAGED AT OR BELOW PT'S PAIN GOAL Description: Less than 4 with PRN medications min assist  Outcome: Progressing   Problem: RH KNOWLEDGE DEFICIT GENERAL Goal: RH STG INCREASE KNOWLEDGE OF SELF CARE AFTER HOSPITALIZATION Description: Patient will be able to manage medications, self care and diet/lifestyle modifications to improve overall health from nursing education and nursing handouts independently  Outcome: Progressing

## 2023-09-24 NOTE — Progress Notes (Signed)
PROGRESS NOTE   Subjective/Complaints:  No acute complaints.  No event overnight.  Does endorse a large bowel movement this morning, with improvement in abdominal symptoms.  ROS: Constipation-improved  Pt denies SOB, abd pain, CP, N/V/C/D, and vision changes  Objective:   No results found. Recent Labs    09/22/23 0521 09/23/23 0652  WBC 7.5 8.3  HGB 7.3* 7.5*  HCT 22.8* 23.7*  PLT 129* 160   Recent Labs    09/22/23 0521 09/23/23 0652  NA 140 141  K 3.4* 3.9  CL 111 111  CO2 24 26  GLUCOSE 130* 127*  BUN 25* 22  CREATININE 1.19 1.06  CALCIUM 7.8* 7.9*    Intake/Output Summary (Last 24 hours) at 09/24/2023 1401 Last data filed at 09/24/2023 0750 Gross per 24 hour  Intake 372 ml  Output 525 ml  Net -153 ml        Physical Exam: Vital Signs Blood pressure (!) 160/65, pulse 79, temperature 98.1 F (36.7 C), temperature source Oral, resp. rate 18, height 5\' 7"  (1.702 m), weight 66.7 kg, SpO2 98%.     General: awake, alert, appropriate, sitting up. HENT: conjugate gaze; oropharynx moist CV: regular rate irregular rhythm; no JVD Pulmonary: CTA B/L; no W/R/R- good air movement GI: soft, NT, ND, (+)BS- normoactive Psychiatric: appropriate- interactive Neurological: Awake, alert, oriented.  Appropriate. Moving all 4 limbs in bed  Skin; Abdominal incision glued- looks good- no drainage- only localized erythema around glue-not examined today    Assessment/Plan: 1. Functional deficits which require 3+ hours per day of interdisciplinary therapy in a comprehensive inpatient rehab setting. Physiatrist is providing close team supervision and 24 hour management of active medical problems listed below. Physiatrist and rehab team continue to assess barriers to discharge/monitor patient progress toward functional and medical goals  Care Tool:  Bathing    Body parts bathed by patient: Right arm, Left arm,  Chest, Abdomen, Front perineal area, Right upper leg, Left upper leg, Face   Body parts bathed by helper: Left lower leg, Right lower leg, Buttocks     Bathing assist Assist Level: Minimal Assistance - Patient > 75%     Upper Body Dressing/Undressing Upper body dressing   What is the patient wearing?: Pull over shirt    Upper body assist Assist Level: Contact Guard/Touching assist    Lower Body Dressing/Undressing Lower body dressing      What is the patient wearing?: Pants, Incontinence brief     Lower body assist Assist for lower body dressing: Maximal Assistance - Patient 25 - 49%     Toileting Toileting Toileting Activity did not occur (Clothing management and hygiene only): N/A (no void or bm)  Toileting assist Assist for toileting: Maximal Assistance - Patient 25 - 49%     Transfers Chair/bed transfer  Transfers assist     Chair/bed transfer assist level: Minimal Assistance - Patient > 75%     Locomotion Ambulation   Ambulation assist      Assist level: Minimal Assistance - Patient > 75% Assistive device: Walker-rolling Max distance: 120   Walk 10 feet activity   Assist     Assist level: Minimal Assistance - Patient >  75% Assistive device: Walker-rolling   Walk 50 feet activity   Assist    Assist level: Minimal Assistance - Patient > 75% Assistive device: Walker-rolling    Walk 150 feet activity   Assist    Assist level: Minimal Assistance - Patient > 75% Assistive device: Walker-rolling    Walk 10 feet on uneven surface  activity   Assist     Assist level: Minimal Assistance - Patient > 75% Assistive device: Walker-rolling   Wheelchair     Assist Is the patient using a wheelchair?: No             Wheelchair 50 feet with 2 turns activity    Assist            Wheelchair 150 feet activity     Assist          Blood pressure (!) 160/65, pulse 79, temperature 98.1 F (36.7 C), temperature  source Oral, resp. rate 18, height 5\' 7"  (1.702 m), weight 66.7 kg, SpO2 98%.  Medical Problem List and Plan: Debility following AAA repair             -patient may shower             -ELOS/Goals: S 10-14 days             Con't CIR PT and OT  Limited by Hb as well as dementia  2.  Antithrombotics: -DVT/anticoagulation:  Mechanical: Sequential compression devices, below knee Bilateral lower extremities 11/21-11/22 not on Lovenox due to drop in Hb as well as low plts              -antiplatelet therapy: Aspirin 81 mg daily   3. Pain Management: Tylenol as needed   11/22- asked nursing to bring pain meds for neck pain 4. Mood/Behavior/Sleep: LCSW to evaluate and provide emotional support             -antipsychotic agents: n/a   5. Neuropsych/cognition: This patient is not capable of making decisions on his own behalf. Due to dementia   6. Skin/Wound Care: Routine skin care checks   7. Fluids/Electrolytes/Nutrition: Routine Is and Os and follow-up chemistries             -dysphagia 3 diet with thin liquids/carb modified   8: Hypertension: monitor TID and prn             -home losartan 100 mg daily and amlodipine 5 mg daily held   11/22- BP somewhat elevated for last 24 hours- will restart home Norvasc 5 mg daily and monitor  11-23: Got a single dose of amlodipine 10 mg exam; reduce back down to 5, monitor 2 to 3 days before further increase    09/24/2023    4:28 AM 09/23/2023    7:38 PM 09/23/2023    3:00 PM  Vitals with BMI  Systolic 160 144 161  Diastolic 65 64 72  Pulse 79 84 80    9: 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 Randie Heinz.             -continue aspirin             -follow-up VVS   10: AKI: improving              -follow-up BMP   11/22- Cr down to 1.06- doing better 11: ABLA/chronic anemia: downward drift in H and H over  last 48 hours             -follow-up CBC    11/21- Hb down to 7.3 from 8.8- in 2 days- will recheck in AM- check FOBT daily x 3 days, start Fe sulfate 325 mg qday.  Had colonoscopy in Spring 2024- no issues- will also call [facility] to inform them -they don't want any additional imaging at this time.   11/22- no BM to do hemoccult- also Hb up to 7.5 today- will recheck Monday and f/u on this.  12: Prediabetes: home metformin 1000 mg held             -continue SSI   11/21- well controlled- con't regimen- will restart Metformin 500 mg BID- since renal function doing better- wife odesn't want him receiving insulin by d/c (he's only on SSI).   11/22- BG's runningd 118 to 180's- will con't Metformin- home dose 1000 mg BID- but will wait to increase for a few days.   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 Hypokalemia  11/21- K+ 3.4- will replete with 40 mEq x1 and recheck in AM  11/22- K+ up to 3.9 18. Thrombocytopenia  11/22- plts up to 160k from low of 86k- but will not restart Lovenox since Hb so low.  19. Constipation  11/22- LBM 2 days ago- if no BM by tomorrow, will need intervention  11-23: Large bowel movement this a.m.     LOS: 3 days A FACE TO FACE EVALUATION WAS PERFORMED  Angelina Sheriff 09/24/2023, 2:01 PM

## 2023-09-25 DIAGNOSIS — R5381 Other malaise: Secondary | ICD-10-CM | POA: Diagnosis not present

## 2023-09-25 DIAGNOSIS — I10 Essential (primary) hypertension: Secondary | ICD-10-CM | POA: Diagnosis not present

## 2023-09-25 LAB — GLUCOSE, CAPILLARY
Glucose-Capillary: 106 mg/dL — ABNORMAL HIGH (ref 70–99)
Glucose-Capillary: 188 mg/dL — ABNORMAL HIGH (ref 70–99)
Glucose-Capillary: 144 mg/dL — ABNORMAL HIGH (ref 70–99)
Glucose-Capillary: 138 mg/dL — ABNORMAL HIGH (ref 70–99)
Glucose-Capillary: 147 mg/dL — ABNORMAL HIGH (ref 70–99)

## 2023-09-25 LAB — OCCULT BLOOD X 1 CARD TO LAB, STOOL: Fecal Occult Bld: POSITIVE — AB

## 2023-09-25 NOTE — Progress Notes (Signed)
Physical Therapy Session Note  Patient Details  Name: Walter [name] MRN: [id number] Date of Birth: 11-05-1945  Today's Date: 09/25/2023 PT Individual Time: 0900-1000 PT Individual Time Calculation (min): 60 min   Short Term Goals: Week 1:  PT Short Term Goal 1 (Week 1): STG = LTG due to ELOS  Skilled Therapeutic Interventions/Progress Updates:      Therapy Documentation Precautions:  Precautions Precautions: Fall Precaution Comments: Parkinsons Restrictions Weight Bearing Restrictions: No  PT session with emphasis on anterior weight shift with transfer training and for dynamic standing balance as pt presents with retropulsion.   Pt supervision bed mobility, mod A initial sit<>stand and supervision following, and CGA with gait ~50 feet to ortho gym with RW. Pt requires min cues for proximity of walker.   Pt requires supervision for sit<>stand and dynamic standing balance as he participated in blocked practice of standing from declined wedge to facilitate anterior weight shift. Pt performed following activities with above set-up:   -sit<>stand with placement and retrieval of squigs from mirror and floor level with squats   -sit<>stand without UE support x 8   -sit<>stand with 3# dumb bell 1 x 8 + 1 x 4  Pt requires cues for technique and increased time for initiation of activity. Pt with improved anterior weight shift following interventions to better facilitate safety with transfers.   Pt close supervision ambulation to return to room and for toileting attempt, negative for bowel movement or voiding.   Pt left semi-reclined in bed with all needs in reach and alarm on.     Therapy/Group: Individual Therapy  Truitt Leep Truitt Leep PT, DPT  09/25/2023, 9:38 AM

## 2023-09-25 NOTE — Progress Notes (Signed)
Occupational Therapy Session Note  Patient Details  Name: Walter [name] MRN: [id number] Date of Birth: 1946/06/26  Today's Date: 09/25/2023 OT Individual Time: 1100-1200 & 4401-0272 OT Individual Time Calculation (min): 60 min & 41 min   Short Term Goals: Week 1:  OT Short Term Goal 1 (Week 1): Pt will don shirt with mod A OT Short Term Goal 2 (Week 1): Pt will perform sit to stand with close supervision in prep for LB clothing management OT Short Term Goal 3 (Week 1): Pt will performing 1 grooming tasks in standing wtih min A for balance OT Short Term Goal 4 (Week 1): Pt will thread pants with mod A with extra time OT Short Term Goal 5 (Week 1): Pt will come to EOB from flat bed with min A with extra time in prep for ADLs  Skilled Therapeutic Interventions/Progress Updates:      Therapy Documentation Precautions:  Precautions Precautions: Fall Precaution Comments: Parkinsons Restrictions Weight Bearing Restrictions: No Session 1 General: "It's always a pleasure!" Pt supine in bed upon OT arrival, agreeable to OT session.  Pain: no pain reported  ADL: Bed mobility: CGA supine>EOB, although Min A for static unsupported sitting EOB with posterior lean, VC to correct posture  Toilet transfer: Min A ambulating with RW from W/C ~20 ft, increased difficulty with making turns for transfers with VC for increased stride length and stepping feet to turn Toileting: Min A, assistance with hygiene after small BM, pt able to manage pants with CGA with increased time  Exercises:  Pt completed the following exercise circuit in order to improve functional activity, strength and endurance to prepare for ADLs such as bathing. Pt completed the following exercises in seated/standing position with no noted LOB/SOB and 3x10 repetitions on each exercise with 3# dowel rod: -bicep curls -chest press -shoulder flexion -sit to stand W/C><RW with emphasis on improving posture during stands and safe  transfer technique  Pt completed 12 minutes of arm bike in order to increase BUE/BLE strength and endurance in preparation for increased independence in ADLs such as bathing.  on level 1 resistance.   Pt seated in W/C at end of session with W/C alarm donned, call light within reach and 4Ps assessed.    Session 2 "You're out of sight!" Pt seated on commode upon OT arrival, agreeable to OT.  Pain: no pain reported  ADL: Toilet transfer: CGA with RW and VC for posture and increased stride length Toileting: CGA in standing for wiping after smear BM and pants management, VC for increased posture, increased time required to complete UB dressing: Min A for button up shirt, VC for shirt positioning and confusion where sleeve was, able to button shirt with BUE LB dressing: Mod A, assistance required to thread over feet, able to manage over waist Footwear: total A to doff shoes and don grip socks Bathing: Mod A overall, assistance for back, thoroughness for buttocks and lower legs, pt able to complete rest of bathing, peri area in standing with CGA and VC for posture Transfers: see level above, pt with increased stride length with VC during transfers  Other Treatments: Nurse and OT completed skin check on incision during bathing.   Pt supine in bed with bed alarm activated, 2 bed rails up, call light within reach and 4Ps assessed.   Therapy/Group: Individual Therapy  [name], OTD, OTR/L 09/25/2023, 1:50 PM

## 2023-09-25 NOTE — Progress Notes (Signed)
Stool sample collected and sent to lab.   Tilden Dome, LPN

## 2023-09-25 NOTE — Plan of Care (Signed)
  Problem: Consults Goal: RH GENERAL PATIENT EDUCATION Description: See Patient Education module for education specifics. Outcome: Progressing   Problem: RH BOWEL ELIMINATION Goal: RH STG MANAGE BOWEL WITH ASSISTANCE Description: STG Manage Bowel with min Assistance. Outcome: Progressing Goal: RH STG MANAGE BOWEL W/MEDICATION W/ASSISTANCE Description: STG Manage Bowel with Medication with min Assistance. Outcome: Progressing   Problem: RH BLADDER ELIMINATION Goal: RH STG MANAGE BLADDER WITH ASSISTANCE Description: STG Manage Bladder With min Assistance Outcome: Progressing   Problem: RH SKIN INTEGRITY Goal: RH STG SKIN FREE OF INFECTION/BREAKDOWN Description: Incisions will continue to heal and skin tears will improve without additional breakdown with min assist Outcome: Progressing Goal: RH STG MAINTAIN SKIN INTEGRITY WITH ASSISTANCE Description: STG Maintain Skin Integrity With min Assistance. Outcome: Progressing Goal: RH STG ABLE TO PERFORM INCISION/WOUND CARE W/ASSISTANCE Description: STG Able To Perform Incision/Wound Care With min Assistance. Outcome: Progressing   Problem: RH SAFETY Goal: RH STG ADHERE TO SAFETY PRECAUTIONS W/ASSISTANCE/DEVICE Description: STG Adhere to Safety Precautions With min Assistance/Device. Outcome: Progressing Goal: RH STG DECREASED RISK OF FALL WITH ASSISTANCE Description: STG Decreased Risk of Fall With min Assistance. Outcome: Progressing   Problem: RH PAIN MANAGEMENT Goal: RH STG PAIN MANAGED AT OR BELOW PT'S PAIN GOAL Description: Less than 4 with PRN medications min assist  Outcome: Progressing   Problem: RH KNOWLEDGE DEFICIT GENERAL Goal: RH STG INCREASE KNOWLEDGE OF SELF CARE AFTER HOSPITALIZATION Description: Patient will be able to manage medications, self care and diet/lifestyle modifications to improve overall health from nursing education and nursing handouts independently  Outcome: Progressing

## 2023-09-25 NOTE — Progress Notes (Signed)
Awaiting patient to have another bowel movement to obtain for stool sample.   Tilden Dome, LPN

## 2023-09-25 NOTE — Progress Notes (Signed)
PROGRESS NOTE   Subjective/Complaints:  Pt doing well, slept well, denies pain, had a BM yesterday and another one this morning. Urinating ok. Denies any other complaints or concerns today.   ROS: Constipation-improved  Pt denies SOB, abd pain, CP, N/V/C/D, and vision changes  Objective:   No results found. Recent Labs    09/23/23 0652  WBC 8.3  HGB 7.5*  HCT 23.7*  PLT 160   Recent Labs    09/23/23 0652  NA 141  K 3.9  CL 111  CO2 26  GLUCOSE 127*  BUN 22  CREATININE 1.06  CALCIUM 7.9*    Intake/Output Summary (Last 24 hours) at 09/25/2023 0955 Last data filed at 09/25/2023 0800 Gross per 24 hour  Intake 457 ml  Output 1425 ml  Net -968 ml        Physical Exam: Vital Signs Blood pressure (!) 157/68, pulse 85, temperature 98.1 F (36.7 C), temperature source Oral, resp. rate 18, height 5\' 7"  (1.702 m), weight 66.7 kg, SpO2 95%.   General: awake, alert, appropriate, sitting up in bed. HENT: conjugate gaze; oropharynx moist CV: regular rate irregular rhythm; no JVD Pulmonary: CTA B/L; no W/R/R- good air movement GI: soft, NT, ND, (+)BS- normoactive Psychiatric: appropriate- interactive, happy Neurological: Awake, alert, oriented.  Appropriate. Moving all 4 limbs in bed  Skin; Abdominal incision glued- looks good- no drainage- only localized erythema around glue-not examined today    Assessment/Plan: 1. Functional deficits which require 3+ hours per day of interdisciplinary therapy in a comprehensive inpatient rehab setting. Physiatrist is providing close team supervision and 24 hour management of active medical problems listed below. Physiatrist and rehab team continue to assess barriers to discharge/monitor patient progress toward functional and medical goals  Care Tool:  Bathing    Body parts bathed by patient: Right arm, Left arm, Chest, Abdomen, Front perineal area, Right upper leg,  Left upper leg, Face   Body parts bathed by helper: Left lower leg, Right lower leg, Buttocks     Bathing assist Assist Level: Minimal Assistance - Patient > 75%     Upper Body Dressing/Undressing Upper body dressing   What is the patient wearing?: Pull over shirt    Upper body assist Assist Level: Contact Guard/Touching assist    Lower Body Dressing/Undressing Lower body dressing      What is the patient wearing?: Pants, Incontinence brief     Lower body assist Assist for lower body dressing: Maximal Assistance - Patient 25 - 49%     Toileting Toileting Toileting Activity did not occur (Clothing management and hygiene only): N/A (no void or bm)  Toileting assist Assist for toileting: Maximal Assistance - Patient 25 - 49%     Transfers Chair/bed transfer  Transfers assist     Chair/bed transfer assist level: Minimal Assistance - Patient > 75%     Locomotion Ambulation   Ambulation assist      Assist level: Minimal Assistance - Patient > 75% Assistive device: Walker-rolling Max distance: 120   Walk 10 feet activity   Assist     Assist level: Minimal Assistance - Patient > 75% Assistive device: Walker-rolling   Walk 50 feet  activity   Assist    Assist level: Minimal Assistance - Patient > 75% Assistive device: Walker-rolling    Walk 150 feet activity   Assist    Assist level: Minimal Assistance - Patient > 75% Assistive device: Walker-rolling    Walk 10 feet on uneven surface  activity   Assist     Assist level: Minimal Assistance - Patient > 75% Assistive device: Walker-rolling   Wheelchair     Assist Is the patient using a wheelchair?: No             Wheelchair 50 feet with 2 turns activity    Assist            Wheelchair 150 feet activity     Assist          Blood pressure (!) 157/68, pulse 85, temperature 98.1 F (36.7 C), temperature source Oral, resp. rate 18, height 5\' 7"  (1.702 m), weight  66.7 kg, SpO2 95%.  Medical Problem List and Plan: Debility following AAA repair             -patient may shower             -ELOS/Goals: S 10-14 days             Con't CIR PT and OT  Limited by Hb as well as dementia  2.  Antithrombotics: -DVT/anticoagulation:  Mechanical: Sequential compression devices, below knee Bilateral lower extremities 11/21-11/22 not on Lovenox due to drop in Hb as well as low plts              -antiplatelet therapy: Aspirin 81 mg daily   3. Pain Management: Tylenol, oxycodone, and robaxin as needed   11/22- asked nursing to bring pain meds for neck pain 4. Mood/Behavior/Sleep: LCSW to evaluate and provide emotional support             -antipsychotic agents: n/a   5. Neuropsych/cognition: This patient is not capable of making decisions on his own behalf. Due to dementia   6. Skin/Wound Care: Routine skin care checks   7. Fluids/Electrolytes/Nutrition: Routine Is and Os and follow-up chemistries             -dysphagia 3 diet with thin liquids/carb modified   8: Hypertension: monitor TID and prn             -home losartan 100 mg daily and amlodipine 5 mg daily held  11/22- BP somewhat elevated for last 24 hours- will restart home Norvasc 5 mg daily and monitor 11-23: Got a single dose of amlodipine 10 mg exam; reduce back down to 5, monitor 2 to 3 days before further increase -09/25/23 BP a little variable, monitor trend with med adjustment Vitals:   09/21/23 1836 09/21/23 1934 09/22/23 0502 09/22/23 1432  BP: (!) 145/60 (!) 143/63 (!) 153/65 (!) 148/66   09/22/23 1956 09/23/23 0442 09/23/23 1500 09/23/23 1938  BP: (!) 148/58 (!) 156/75 (!) 140/72 (!) 144/64   09/24/23 0428 09/24/23 1455 09/24/23 1944 09/25/23 0456  BP: (!) 160/65 (!) 137/58 (!) 152/64 (!) 157/68    9: 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 Randie Heinz.             -continue aspirin              -follow-up [facility]   10: AKI: improving    11/22- Cr down to 1.06- doing better  11: ABLA/chronic anemia: downward drift in H and H over last 48 hours, continue iron 11/21- Hb down to 7.3 from 8.8- in 2 days- will recheck in AM- check FOBT daily x 3 days, start Fe sulfate 325 mg qday.  Had colonoscopy in Spring 2024- no issues- will also call [facility] to inform them -they don't want any additional imaging at this time.  11/22- no BM to do hemoccult- also Hb up to 7.5 today- will recheck Monday and f/u on this.  -09/25/23 no FOBT done, even though he's had BMs; asked nursing to please obtain this. Labs ordered for tomorrow already.  12: Prediabetes: home metformin 1000 mg held             -continue SSI 11/21- well controlled- con't regimen- will restart Metformin 500 mg BID- since renal function doing better- wife doesn't want him receiving insulin by d/c (he's only on SSI).  11/22- BG's running 118 to 180's- will con't Metformin- home dose 1000 mg BID- but will wait to increase for a few days.   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 Hypokalemia  11/21- K+ 3.4- will replete with 40 mEq x1 and recheck in AM  11/22- K+ up to 3.9  18. Thrombocytopenia 11/22- plts up to 160k from low of 86k- but will not restart Lovenox since Hb so low.   19. Constipation: on colace 100mg  daily  11/22- LBM 2 days ago- if no BM by tomorrow, will need intervention  -09/25/23 BMs yesterday and today; monitor     LOS: 4 days A FACE TO FACE EVALUATION WAS PERFORMED  [address] Walter [name] 09/25/2023, 9:55 AM

## 2023-09-25 NOTE — Progress Notes (Signed)
Occupational Therapy Session Note  Patient Details  Name: Walter [name] MRN: [id number] Date of Birth: 04-Jul-1946  Today's Date: 09/25/2023 OT Individual Time: 1093-2355 OT Individual Time Calculation (min): 45 min    Short Term Goals: Week 1:  OT Short Term Goal 1 (Week 1): Pt will don shirt with mod A OT Short Term Goal 2 (Week 1): Pt will perform sit to stand with close supervision in prep for LB clothing management OT Short Term Goal 3 (Week 1): Pt will performing 1 grooming tasks in standing wtih min A for balance OT Short Term Goal 4 (Week 1): Pt will thread pants with mod A with extra time OT Short Term Goal 5 (Week 1): Pt will come to EOB from flat bed with min A with extra time in prep for ADLs  Skilled Therapeutic Interventions/Progress Updates:    Patient in bed with family present at the time of arrival. Patient in agreement with completing UB exercise. Patient able to come from supine in bed to EOB with MinA. Patient able to transfer from EOB to w/c LOF with CGA. The pt was instructed in relaxation breathing to improve compliance. The pt was able to complete UB exercise using a 2lb dumb bell for bicep curl 2 sets of 10 with rest breaks as needed. The pt required 1 rest break.  The pt went on to complete cone retrieval activity by retrieving the cones with one hand and placing the cone in the opposite hand.  The pt was able to  complete a simulated task in UB/LB dressing after demonstration and vc's  using CGA and  the RW for additional balance when coming from sit to stand.  Following simulated task in dressing, the pt returned to bed LOF by coming from w/c LOF to EOB using the RW and the bed rails at CGA.  The pt was MinA for  transitioning from EOB to supine for managing BLE. Prior to exiting the room, the  call light and bedside table were both placed  within reach.    Therapy Documentation Precautions:  Precautions Precautions: Fall Precaution Comments:  Parkinsons Restrictions Weight Bearing Restrictions: No  Therapy/Group: Individual Therapy  Lavona Mound 09/25/2023, 2:49 PM

## 2023-09-26 DIAGNOSIS — D649 Anemia, unspecified: Secondary | ICD-10-CM

## 2023-09-26 DIAGNOSIS — Z8679 Personal history of other diseases of the circulatory system: Secondary | ICD-10-CM

## 2023-09-26 DIAGNOSIS — K5520 Angiodysplasia of colon without hemorrhage: Secondary | ICD-10-CM

## 2023-09-26 DIAGNOSIS — R5381 Other malaise: Secondary | ICD-10-CM | POA: Diagnosis not present

## 2023-09-26 LAB — CBC
HCT: 22.6 % — ABNORMAL LOW (ref 39.0–52.0)
Hemoglobin: 7.1 g/dL — ABNORMAL LOW (ref 13.0–17.0)
MCH: 29.5 pg (ref 26.0–34.0)
MCHC: 31.4 g/dL (ref 30.0–36.0)
MCV: 93.8 fL (ref 80.0–100.0)
Platelets: 258 10*3/uL (ref 150–400)
RBC: 2.41 MIL/uL — ABNORMAL LOW (ref 4.22–5.81)
RDW: 13.6 % (ref 11.5–15.5)
WBC: 8.2 10*3/uL (ref 4.0–10.5)
nRBC: 0 % (ref 0.0–0.2)

## 2023-09-26 LAB — BASIC METABOLIC PANEL
Anion gap: 8 (ref 5–15)
BUN: 17 mg/dL (ref 8–23)
CO2: 24 mmol/L (ref 22–32)
Calcium: 7.9 mg/dL — ABNORMAL LOW (ref 8.9–10.3)
Chloride: 106 mmol/L (ref 98–111)
Creatinine, Ser: 1.31 mg/dL — ABNORMAL HIGH (ref 0.61–1.24)
GFR, Estimated: 56 mL/min — ABNORMAL LOW (ref >60)
Glucose, Bld: 116 mg/dL — ABNORMAL HIGH (ref 70–99)
Potassium: 3.5 mmol/L (ref 3.5–5.1)
Sodium: 138 mmol/L (ref 135–145)

## 2023-09-26 LAB — GLUCOSE, CAPILLARY
Glucose-Capillary: 107 mg/dL — ABNORMAL HIGH (ref 70–99)
Glucose-Capillary: 100 mg/dL — ABNORMAL HIGH (ref 70–99)
Glucose-Capillary: 163 mg/dL — ABNORMAL HIGH (ref 70–99)
Glucose-Capillary: 114 mg/dL — ABNORMAL HIGH (ref 70–99)

## 2023-09-26 LAB — PREPARE RBC (CROSSMATCH)

## 2023-09-26 MED ORDER — CALCIUM CARBONATE ANTACID 500 MG PO CHEW
400.0000 mg | CHEWABLE_TABLET | Freq: Two times a day (BID) | ORAL | Status: DC
  Administered 2023-09-26 – 2023-10-04 (×15): 400 mg via ORAL
  Filled 2023-09-26 (×16): qty 2

## 2023-09-26 MED ORDER — SODIUM CHLORIDE 0.9% IV SOLUTION: Freq: Once | INTRAVENOUS | Status: AC

## 2023-09-26 NOTE — Progress Notes (Signed)
Physical Therapy Session Note  Patient Details  Name: Walter [name] MRN: [id number] Date of Birth: Mar 24, 1946  Today's Date: 09/26/2023 PT Individual Time: 0904-1000 PT Individual Time Calculation (min): 56 min   Short Term Goals: Week 1:  PT Short Term Goal 1 (Week 1): STG = LTG due to ELOS  Skilled Therapeutic Interventions/Progress Updates:      Therapy Documentation Precautions:  Precautions Precautions: Fall Precaution Comments: Parkinsons Restrictions Weight Bearing Restrictions: No  Treatment Session 1:   PT session with emphasis on gait, stair and balance/NMR training. Pt declines pain and requires supervision with supine to sit, dependent for time with donning of shoes.   Pt requires several attempts before successful with CGA sit<>stand from bed and gait >200 ft with cues to increase step length.   Pt CGA/supervision for dynamic balance training with following activities:   -alternating cone taps, no UE support, x 10 + 4 + 8   -alternating cone taps holding 2.2 lb ball 2 x 4  -cone taps based on verbal command of color holding 2.2 lb ball  -sit<>stand with OH press up with 2.2 # ball 2 x 5    PT provided multi-modal cueing for rhythm to improve stepping pattern and fluidity of movement.   Pt returned to room and left seated at bedside with all needs in reach and alarm on.    Treatment Session 2:   PT session with emphasis on gait, balance, strength and range of motion training to address functional deficits. Pt attempted to have bowel movement, unsuccessful. Pt and spouse also rounded with GI NP.   Pt supervision with ambulation with RW to ortho gym with cues for increased step length.   Pt requires CGA for dynamic standing balance while performing green resisted thera band scapular retractions 3 x 10 on compliant surface. Pt requires min/mod A for static standing on non-compliant surface, airex foam pad with tactile cues for anteiror weight shift.   Pt  supervision for dynamic sitting balance while performing OH circles while holding soccer ball to facilitate increased trunk expansion as pt presents with forward head and trunk posture. Pt transitioned to supine for chest opener as he performed OH ball pressess to address thoracic and shoulder mobility.   Pt performed active hamstring stretch/flexibility in seated position bilaterally x 1 minute to address mobility deficits.   Pt requested ambulation without AD and presents with significant flexed posture and shuffle (CGA) 40 ft on level surface and unlevel (ramp, mulch, curb step). PT continues to recommend pt utilize RW for increased safety.   Pt ambulated to room and left semi-reclined in bed with all needs in reach and alarm on.   Therapy/Group: Individual Therapy  Truitt Leep Truitt Leep PT, DPT  09/26/2023, 7:42 AM

## 2023-09-26 NOTE — Progress Notes (Incomplete)
Blood completion successful with no reaction. VSS, patient resting w/o acute disress

## 2023-09-26 NOTE — Progress Notes (Signed)
Occupational Therapy Session Note  Patient Details  Name: Walter [name] MRN: [id number] Date of Birth: 1946/04/12  Today's Date: 09/26/2023 OT Individual Time: 1030-1150 OT Individual Time Calculation (min): 80 min    Short Term Goals: Week 1:  OT Short Term Goal 1 (Week 1): Pt will don shirt with mod A OT Short Term Goal 2 (Week 1): Pt will perform sit to stand with close supervision in prep for LB clothing management OT Short Term Goal 3 (Week 1): Pt will performing 1 grooming tasks in standing wtih min A for balance OT Short Term Goal 4 (Week 1): Pt will thread pants with mod A with extra time OT Short Term Goal 5 (Week 1): Pt will come to EOB from flat bed with min A with extra time in prep for ADLs  Skilled Therapeutic Interventions/Progress Updates:      Therapy Documentation Precautions:  Precautions Precautions: Fall Precaution Comments: Parkinsons Restrictions Weight Bearing Restrictions: No General: "I'm going to miss you!" Pt seated in W/C upon OT arrival, agreeable to OT.  Pain: no pain reported  ADL: Bed mobility: SBA with VC to bring legs onto bed, seated EOB>supine Grooming: SBA seated in W/C to brush hair using comb, no VC required Oral hygiene: SBA seated in W/C at sink, able to manipulate toothpaste container and press button for electric toothbrush UB dressing: SBA with increased time to process/problem solve, VC required for locating hole for head with shirt, able to correct without tactile intervention LB dressing: CGA for stability, pt educated on use of reacher with good carryover of task demonstrating increased independence overall with task Footwear: SBA seated in W/C, educated on use of sock aide, with good carryover of task demonstrating increased independence overall with task Shower transfer: CGA with use of RW with VC for increased stride length ambulating ~20 feet from room, VC when turning for increase stride Bathing: Mod A bathing overall,  assistance required for LB bathing seated on TTB  Exercises: Pt issued UE theraband HEP in order to increase functional strength, andurance and activity tolerance in order to increase independence in ADLs such as bathing. Pt issued yellow theraband and discussed direction/technique of exercises, demonstrating verbal understanding. Pt completed 3x10 exercises at W/C level listed below: -elbow extensions - shoulder horizontal abduction -bicep curls  -shoulder flexion -diagonal shoulder flexion -external rotation  Pt supine in bed with bed alarm activated, 2 bed rails up, call light within reach and 4Ps assessed.   Therapy/Group: Individual Therapy  [name], OTD, OTR/L 09/26/2023, 12:39 PM

## 2023-09-26 NOTE — Progress Notes (Signed)
  Progress Note    09/26/2023 12:57 PM * No surgery found *  Subjective: No specific complaints today  Vitals:   09/25/23 2038 09/26/23 0435  BP: (!) 142/66 (!) 156/75  Pulse: 78 81  Resp: 16 18  Temp: 98.4 F (36.9 C) 98.2 F (36.8 C)  SpO2: 97% 98%    Physical Exam: Awake alert and oriented Nonlabored respirations Patient is awake and fully clothed at the sink working with occupational therapy and incisions were not checked today  CBC    Component Value Date/Time   WBC 8.2 09/26/2023 0147   RBC 2.41 (L) 09/26/2023 0147   HGB 7.1 (L) 09/26/2023 0147   HGB 11.3 (L) 04/18/2023 0816   HCT 22.6 (L) 09/26/2023 0147   HCT 36.1 (L) 04/18/2023 0816   PLT 258 09/26/2023 0147   PLT 227 04/18/2023 0816   MCV 93.8 09/26/2023 0147   MCV 86 04/18/2023 0816   MCH 29.5 09/26/2023 0147   MCHC 31.4 09/26/2023 0147   RDW 13.6 09/26/2023 0147   RDW 18.1 (H) 04/18/2023 0816   LYMPHSABS 0.9 09/23/2023 0652   LYMPHSABS 1.2 04/18/2023 0816   MONOABS 0.6 09/23/2023 0652   EOSABS 0.2 09/23/2023 0652   EOSABS 0.2 04/18/2023 0816   BASOSABS 0.0 09/23/2023 0652   BASOSABS 0.0 04/18/2023 0816    BMET    Component Value Date/Time   NA 138 09/26/2023 0445   K 3.5 09/26/2023 0445   CL 106 09/26/2023 0445   CO2 24 09/26/2023 0445   GLUCOSE 116 (H) 09/26/2023 0445   GLUCOSE 127 (H) 08/17/2006 1059   BUN 17 09/26/2023 0445   CREATININE 1.31 (H) 09/26/2023 0445   CALCIUM 7.9 (L) 09/26/2023 0445   GFRNONAA 56 (L) 09/26/2023 0445   GFRAA >60 01/07/2019 0751    INR    Component Value Date/Time   INR 1.4 (H) 09/16/2023 1620     Intake/Output Summary (Last 24 hours) at 09/26/2023 1257 Last data filed at 09/26/2023 0981 Gross per 24 hour  Intake 1178 ml  Output 975 ml  Net 203 ml     Assessment:  77 y.o. male is s/p AUI requiring left common iliac to common femoral bypass and left to right femorofemoral bypass now in rehab.  Plan: Available as needed  Neala Miggins C. Randie Heinz,  MD [facility] Office: [phone] Pager: [phone]  09/26/2023 12:57 PM

## 2023-09-26 NOTE — Progress Notes (Signed)
PROGRESS NOTE   Subjective/Complaints:  Pt reports doing well this AM- denies pain-  LBM yesterday- found to be positive hemoccult- seen this AM-   Pt denies any issues, however Hb down to 7.1-  Cr also back up to 1.31 and Ca stable at 7.9.    ROS:   Pt denies SOB, abd pain, CP, N/V/C/D, and vision changes Except for HPI  Objective:   No results found. Recent Labs    09/26/23 0147  WBC 8.2  HGB 7.1*  HCT 22.6*  PLT 258   Recent Labs    09/26/23 0445  NA 138  K 3.5  CL 106  CO2 24  GLUCOSE 116*  BUN 17  CREATININE 1.31*  CALCIUM 7.9*    Intake/Output Summary (Last 24 hours) at 09/26/2023 0900 Last data filed at 09/26/2023 4098 Gross per 24 hour  Intake 1178 ml  Output 1200 ml  Net -22 ml        Physical Exam: Vital Signs Blood pressure (!) 156/75, pulse 81, temperature 98.2 F (36.8 C), temperature source Oral, resp. rate 18, height 5\' 7"  (1.702 m), weight 66.7 kg, SpO2 98%.    General: awake, alert, appropriate, sitting up in bed- finished 100% tray; NAD HENT: conjugate gaze; oropharynx a little dry- pale conjunctivae due to anemia CV: regular rate and rhythm; no JVD Pulmonary: CTA B/L; no W/R/R- good air movement GI: soft, NT, ND, (+)BS- normoactive Psychiatric: appropriate- mor einteractive Neurological: alert Moving all 4 limbs in bed  Skin; Abdominal incision glued- looks good- no drainage- only localized erythema around glue-looks good    Assessment/Plan: 1. Functional deficits which require 3+ hours per day of interdisciplinary therapy in a comprehensive inpatient rehab setting. Physiatrist is providing close team supervision and 24 hour management of active medical problems listed below. Physiatrist and rehab team continue to assess barriers to discharge/monitor patient progress toward functional and medical goals  Care Tool:  Bathing    Body parts bathed by patient: Right  arm, Left arm, Chest, Abdomen, Front perineal area, Right upper leg, Left upper leg, Face   Body parts bathed by helper: Left lower leg, Right lower leg, Buttocks     Bathing assist Assist Level: Minimal Assistance - Patient > 75%     Upper Body Dressing/Undressing Upper body dressing   What is the patient wearing?: Pull over shirt    Upper body assist Assist Level: Contact Guard/Touching assist    Lower Body Dressing/Undressing Lower body dressing      What is the patient wearing?: Pants, Incontinence brief     Lower body assist Assist for lower body dressing: Maximal Assistance - Patient 25 - 49%     Toileting Toileting Toileting Activity did not occur (Clothing management and hygiene only): N/A (no void or bm)  Toileting assist Assist for toileting: Maximal Assistance - Patient 25 - 49%     Transfers Chair/bed transfer  Transfers assist     Chair/bed transfer assist level: Minimal Assistance - Patient > 75%     Locomotion Ambulation   Ambulation assist      Assist level: Minimal Assistance - Patient > 75% Assistive device: Walker-rolling Max distance: 120  Walk 10 feet activity   Assist     Assist level: Minimal Assistance - Patient > 75% Assistive device: Walker-rolling   Walk 50 feet activity   Assist    Assist level: Minimal Assistance - Patient > 75% Assistive device: Walker-rolling    Walk 150 feet activity   Assist    Assist level: Minimal Assistance - Patient > 75% Assistive device: Walker-rolling    Walk 10 feet on uneven surface  activity   Assist     Assist level: Minimal Assistance - Patient > 75% Assistive device: Walker-rolling   Wheelchair     Assist Is the patient using a wheelchair?: No             Wheelchair 50 feet with 2 turns activity    Assist            Wheelchair 150 feet activity     Assist          Blood pressure (!) 156/75, pulse 81, temperature 98.2 F (36.8 C),  temperature source Oral, resp. rate 18, height 5\' 7"  (1.702 m), weight 66.7 kg, SpO2 98%.  Medical Problem List and Plan: Debility following AAA repair             -patient may shower             -ELOS/Goals: S 10-14 days             Con't CIR PT and OT  Limited by Hb as well as dementia  2.  Antithrombotics: -DVT/anticoagulation:  Mechanical: Sequential compression devices, below knee Bilateral lower extremities 11/21-11/22 not on Lovenox due to drop in Hb as well as low plts              -antiplatelet therapy: Aspirin 81 mg daily   3. Pain Management: Tylenol, oxycodone, and robaxin as needed   11/22- asked nursing to bring pain meds for neck pain  11/25- denies pain this AM 4. Mood/Behavior/Sleep: LCSW to evaluate and provide emotional support             -antipsychotic agents: n/a   5. Neuropsych/cognition: This patient is not capable of making decisions on his own behalf. Due to dementia   6. Skin/Wound Care: Routine skin care checks   7. Fluids/Electrolytes/Nutrition: Routine Is and Os and follow-up chemistries             -dysphagia 3 diet with thin liquids/carb modified   8: Hypertension: monitor TID and prn             -home losartan 100 mg daily and amlodipine 5 mg daily held  11/22- BP somewhat elevated for last 24 hours- will restart home Norvasc 5 mg daily and monitor 11-23: Got a single dose of amlodipine 10 mg exam; reduce back down to 5, monitor 2 to 3 days before further increase -09/25/23 BP a little variable, monitor trend with med adjustment 11/25- BP running 130s to 150s systolic- if still up tomorrow- will add back Losartan- home medicine.  Vitals:   09/22/23 1432 09/22/23 1956 09/23/23 0442 09/23/23 1500  BP: (!) 148/66 (!) 148/58 (!) 156/75 (!) 140/72   09/23/23 1938 09/24/23 0428 09/24/23 1455 09/24/23 1944  BP: (!) 144/64 (!) 160/65 (!) 137/58 (!) 152/64   09/25/23 0456 09/25/23 1639 09/25/23 2038 09/26/23 0435  BP: (!) 157/68 (!) 143/54 (!) 142/66  (!) 156/75    9: AAA s/p endovascular repair with left common iliac to left common femoral artery bypass and stent  of left external iliac artery/15 by Dr. Randie Heinz.             -continue aspirin             -follow-up [facility]   10: AKI: improving    11/22- Cr down to 1.06- doing better 11: ABLA/chronic anemia: downward drift in H and H over last 48 hours, continue iron 11/21- Hb down to 7.3 from 8.8- in 2 days- will recheck in AM- check FOBT daily x 3 days, start Fe sulfate 325 mg qday.  Had colonoscopy in Spring 2024- no issues- will also call Vascular to inform them -they don't want any additional imaging at this time.  11/22- no BM to do hemoccult- also Hb up to 7.5 today- will recheck Monday and f/u on this.  -09/25/23 no FOBT done, even though he's had BMs; asked nursing to please obtain this. Labs ordered for tomorrow already.  11/25- FOBT (+)- and Hb down to 7.1- however no Increase in BUN to signal fast GI bleed- will transfuse if pt will allow- D/w PA- and she will do transfusion if pt allows.  12: Prediabetes: home metformin 1000 mg held             -continue SSI 11/21- well controlled- con't regimen- will restart Metformin 500 mg BID- since renal function doing better- wife doesn't want him receiving insulin by d/c (he's only on SSI).  11/22- BG's running 118 to 180's- will con't Metformin- home dose 1000 mg BID- but will wait to increase for a few days.   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 Hypokalemia  11/21- K+ 3.4- will replete with 40 mEq x1 and recheck in AM  11/22- K+ up to 3.9  11/25- K+  3.5 18. Thrombocytopenia 11/22- plts up to 160k from low of 86k- but will not restart Lovenox since Hb so low.   11/25- Back up to 258k 19. Constipation: on colace 100mg  daily  11/22- LBM 2 days ago- if no BM by tomorrow, will need intervention  -09/25/23 BMs yesterday and today; monitor  11/25- LBM yesterday 20 Hypocalcemia  11/25- will add Tums- 400 units BID with meals and recheck Thursday   I spent a total of 43    minutes on total care today- >50% coordination of care- due to  D/w PA about Hb of 7.1- and FOBT (+)- might need GI? Needs transfusion- and also addressed BP, Ca, and reviewed labs and vitals.      LOS: 5 days A FACE TO FACE EVALUATION WAS PERFORMED  Walter [name] 09/26/2023, 9:00 AM

## 2023-09-26 NOTE — Progress Notes (Signed)
Follow-up labs this morning reveal downward trend in hemoglobin.  Hemoglobin is 7.1 this morning.  Fecal occult blood test returned 1 specimen positive.  Discussed with Dr. Berline Chough.  Recommend transfusion 2 units packed red blood cells and gastroenterology consultation.  I discussed these recommendations with the patient and his wife, Walter [name].  They are in agreement.

## 2023-09-26 NOTE — Consult Note (Signed)
Consultation Note   Referring Provider:  Inpatient Rehab PCP: Kristian Covey, MD Primary Gastroenterologist: Amada Jupiter, MD Reason for Consultation: worsening anemia / FOBT+  DOA: 09/21/2023         Hospital Day: 6   ASSESSMENT    Brief Narrative:  77 y.o. year old male with a history of DM2, HTN, HLD, CAD, infrarenal AAA, PVD, DVT / PE s/p IVC filter, dementia, GERD, diverticulosis, history of iron deficiency anemia May 2024, small bowel AVM   Acute on chronic anemia  / FOBT+. Dark stools ( on iron) We evaluated him for IDA in May. No active bleeding on EGD / colonoscopy. A non-bleeding duodenal AVM was treated. Had acute decline in hgb following AAA repair on 11/15 and required 3 u PRBCs followed by 2 more units PRBCs on 11/17. Now with drifting hgb from 8.9 to 7.1   AAA s/p repair 09/16/23  PE, s/p IVC filter  Dementia  Principal Problem:   Debility   PLAN:   --Continue PO BID PPI. --May need small bowel endoscopy to evaluate for AVMs  --For now, continue to monitor H/H, transfuse as needed  HPI   Patient was seen in the hospital by [place] in early May 2024 for evaluation of melena / iron deficiency anemia. He underwent EGD / colonoscopy with findings as below but no active bleeding. He was found to have a PE and underwent IVC filter placement due to intolerance to blood thinners because of GI bleeding.   On 09/16/23 patient presented for repair of AAA . He required 3 u blood for post-op anemia. Hgb improved but then declined 3 grams to 6.6 on 11/17 and he got 2 additional units of blood. Hgb improved to 8.9 . Transferred to inpatient rehab on 11/20 for debility. In rehab his hgb has drifted slowly to 7.1. Wife reports dark stools but on oral iron. No N/V. No abdominal pain just incisional discomfort with straining. He is on a daily baby asa   Previous GI Evaluations   03/07/23 EGD and colonoscopy for IDA  EGD - Normal  esophagus.  - 3 cm hiatal hernia - Normal stomach. Biopsied to exclude H. Pylori. --A single angioectasia in the duodenum. Treated with argon plasma coagulation (APC) -- 2 non-bleeding duodenal diverticula  Colonoscopy  - One 4 mm polyp in the cecum, removed with a cold snare. Resected and retrieved. - One 5 mm polyp in the ascending colon, removed with a cold snare. Resected and retrieved. - Severe diverticulosis from ascending colon to sigmoid colon. - Small internal hemorrhoids. - The examination was otherwise normal.  A. STOMACH, BIOPSY:  Unremarkable antral and oxyntic mucosa.  Negative for Helicobacter pylori.   B. COLON, CECAL/ASCENDING, POLYPECTOMY:  Colonic mucosa with benign lymphoid aggregate.  Multiple additional levels examined.   Labs and Imaging: Recent Labs    09/26/23 0147  WBC 8.2  HGB 7.1*  HCT 22.6*  PLT 258   Recent Labs    09/26/23 0445  NA 138  K 3.5  CL 106  CO2 24  GLUCOSE 116*  BUN 17  CREATININE 1.31*  CALCIUM 7.9*   No results for input(s): "PROT", "ALBUMIN", "AST", "ALT", "ALKPHOS", "BILITOT", "BILIDIR", "IBILI" in the last 72 hours. No  results for input(s): "HEPBSAG", "HCVAB", "HEPAIGM", "HEPBIGM" in the last 72 hours. No results for input(s): "LABPROT", "INR" in the last 72 hours.    Past Medical History:  Diagnosis Date   Allergy    Cancer [facility])    Skin cancer   Cataract    Coronary artery disease    Diabetes mellitus    Diverticulosis    Esophageal stricture    GERD (gastroesophageal reflux disease)    History of hiatal hernia    Hyperlipidemia    Hypertension    Peripheral vascular disease (HCC)     Past Surgical History:  Procedure Laterality Date   ABDOMINAL AORTIC ENDOVASCULAR STENT GRAFT N/A 09/16/2023   Procedure: ABDOMINAL AORTA ANEURYSM STENT USING COOK 32 X STENT AND ENDO-ANCHORS X 7;  Surgeon: Maeola Harman, MD;  Location: [facility] OR;  Service: Vascular;  Laterality: N/A;   ANEURYSM COILING Right  09/16/2023   Procedure: COILING TO RIGHT COMMON ILIAC;  Surgeon: Maeola Harman, MD;  Location: [facility] OR;  Service: Vascular;  Laterality: Right;   BIOPSY  03/07/2023   Procedure: BIOPSY;  Surgeon: Beverley Fiedler, MD;  Location: [facility] ENDOSCOPY;  Service: Gastroenterology;;   BYPASS GRAFT FEMORAL-PERONEAL Left 09/16/2023   Procedure: LEFT EXTERNAL ILIAC TO COMMON FEMORAL BYPASS USING HEMASHIELD GOLD GRAFT;  Surgeon: Maeola Harman, MD;  Location: [facility] OR;  Service: Vascular;  Laterality: Left;   CATARACT EXTRACTION Bilateral    COLONOSCOPY WITH PROPOFOL N/A 03/07/2023   Procedure: COLONOSCOPY WITH PROPOFOL;  Surgeon: Beverley Fiedler, MD;  Location: [facility] ENDOSCOPY;  Service: Gastroenterology;  Laterality: N/A;   CORONARY ANGIOPLASTY WITH STENT PLACEMENT  2004   ENDARTERECTOMY Left 09/16/2023   Procedure: LEFT ILIAC ENDARTERECTOMY;  Surgeon: Maeola Harman, MD;  Location: [facility] OR;  Service: Vascular;  Laterality: Left;   ENDARTERECTOMY FEMORAL Bilateral 09/16/2023   Procedure: BILATERAL FEMORAL ENDARTERECTOMY;  Surgeon: Maeola Harman, MD;  Location: [facility] OR;  Service: Vascular;  Laterality: Bilateral;   ESOPHAGOGASTRODUODENOSCOPY  06/07/2012   Procedure: ESOPHAGOGASTRODUODENOSCOPY (EGD);  Surgeon: Hart Carwin, MD;  Location: [facility] ENDOSCOPY;  Service: Endoscopy;  Laterality: N/A;   ESOPHAGOGASTRODUODENOSCOPY (EGD) WITH PROPOFOL N/A 03/07/2023   Procedure: ESOPHAGOGASTRODUODENOSCOPY (EGD) WITH PROPOFOL;  Surgeon: Beverley Fiedler, MD;  Location: [facility] ENDOSCOPY;  Service: Gastroenterology;  Laterality: N/A;   FEMORAL-FEMORAL BYPASS GRAFT Bilateral 09/16/2023   Procedure: LEFT TO RIGHT BYPASS GRAFT FEMORAL-FEMORAL ARTERY USING HEMSHIELD GOLD GRAFT;  Surgeon: Maeola Harman, MD;  Location: [facility] OR;  Service: Vascular;  Laterality: Bilateral;   HOT HEMOSTASIS N/A 03/07/2023   Procedure: HOT HEMOSTASIS (ARGON PLASMA COAGULATION/BICAP);  Surgeon: Beverley Fiedler, MD;   Location: [facility] ENDOSCOPY;  Service: Gastroenterology;  Laterality: N/A;   IR KYPHO LUMBAR INC FX REDUCE BONE BX UNI/BIL CANNULATION INC/IMAGING  03/02/2023   IVC FILTER INSERTION N/A 03/09/2023   Procedure: IVC FILTER INSERTION;  Surgeon: Victorino Sparrow, MD;  Location: [facility] INVASIVE CV LAB;  Service: Cardiovascular;  Laterality: N/A;   POLYPECTOMY  03/07/2023   Procedure: POLYPECTOMY;  Surgeon: Beverley Fiedler, MD;  Location: [facility] ENDOSCOPY;  Service: Gastroenterology;;   SHOULDER SURGERY  11/01/2004    Family History  Problem Relation Age of Onset   Breast cancer Mother    Healthy Son    Healthy Son    Parkinson's disease Sister    Colon cancer Neg Hx    Esophageal cancer Neg Hx    Rectal cancer Neg Hx    Stomach cancer Neg Hx  Prior to Admission medications   Medication Sig Start Date End Date Taking? Authorizing Provider  amLODipine (NORVASC) 5 MG tablet Take 1 tablet by mouth once daily 05/27/23   Burchette, Elberta Fortis, MD  aspirin EC 81 MG tablet Take 1 tablet (81 mg total) by mouth daily. Swallow whole. 08/26/23   Mallipeddi, Vishnu P, MD  atorvastatin (LIPITOR) 40 MG tablet Take 1 tablet by mouth once daily 09/12/23   Burchette, Elberta Fortis, MD  Blood Glucose Monitoring Suppl (ONE TOUCH ULTRA 2) w/Device KIT 1 kit by Does not apply route daily. 07/21/20   Waldon Merl, PA-C  cyanocobalamin 2000 MCG tablet Take 1 tablet (2,000 mcg total) by mouth daily. 04/24/19   Waldon Merl, PA-C  Ferrous Sulfate (IRON) 325 (65 Fe) MG TABS Take 1 tablet by mouth once daily 08/30/23   Burchette, Elberta Fortis, MD  Lancets [facility] ULTRASOFT) lancets Check blood sugars once daily. Dx: E11.51 07/21/20   Waldon Merl, PA-C  loratadine (ALLERGY RELIEF) 10 MG tablet Take 10 mg by mouth daily.    [provider]  losartan (COZAAR) 100 MG tablet Take 1 tablet by mouth once daily 09/15/23   Kristian Covey, MD  memantine (NAMENDA) 10 MG tablet Take 1 tablet twice a day 10/29/22   Van Clines, MD  metFORMIN (GLUCOPHAGE) 1000 MG tablet TAKE 1 TABLET BY MOUTH TWICE DAILY WITH A MEAL 09/16/23   Burchette, Elberta Fortis, MD  Multiple Vitamin (MULTIVITAMIN WITH MINERALS) TABS tablet Take 1 tablet by mouth daily. 03/08/23   Lorin Glass, MD  ONETOUCH ULTRA test strip USE 1 STRIP TO CHECK GLUCOSE ONCE DAILY 04/06/23   Burchette, Elberta Fortis, MD  pantoprazole (PROTONIX) 40 MG tablet Take 1 tablet (40 mg total) by mouth daily. 03/08/23   Lorin Glass, MD    Current Facility-Administered Medications  Medication Dose Route Frequency Provider Last Rate Last Admin   0.9 %  sodium chloride infusion (Manually program via Guardrails IV Fluids)   Intravenous Once Milinda Antis, PA-C       acetaminophen (TYLENOL) tablet 325-650 mg  325-650 mg Oral Q4H PRN Milinda Antis, PA-C   650 mg at 09/23/23 1900   alum & mag hydroxide-simeth (MAALOX/MYLANTA) 200-200-20 MG/5ML suspension 30 mL  30 mL Oral Q4H PRN Milinda Antis, PA-C       amLODipine (NORVASC) tablet 5 mg  5 mg Oral Daily Elijah Birk C, DO   5 mg at 09/26/23 4132   aspirin EC tablet 81 mg  81 mg Oral Daily Milinda Antis, PA-C   81 mg at 09/26/23 0748   atorvastatin (LIPITOR) tablet 40 mg  40 mg Oral Daily Milinda Antis, PA-C   40 mg at 09/26/23 4401   bisacodyl (DULCOLAX) EC tablet 5 mg  5 mg Oral Daily PRN Milinda Antis, PA-C       calcium carbonate (TUMS - dosed in mg elemental calcium) chewable tablet 400 mg of elemental calcium  400 mg of elemental calcium Oral BID WC Lovorn, Megan, MD       diphenhydrAMINE (BENADRYL) capsule 25 mg  25 mg Oral Q6H PRN Setzer, Lynnell Jude, PA-C       docusate sodium (COLACE) capsule 100 mg  100 mg Oral Daily Milinda Antis, PA-C   100 mg at 09/26/23 0272   ferrous sulfate tablet 325 mg  325 mg Oral Q breakfast Lovorn, Aundra Millet, MD   325 mg at 09/26/23 0748   guaiFENesin-dextromethorphan (ROBITUSSIN  DM) 100-10 MG/5ML syrup 10 mL  10 mL Oral Q6H PRN Setzer, Lynnell Jude, PA-C       insulin aspart (novoLOG)  injection 0-9 Units  0-9 Units Subcutaneous TID AC & HS Milinda Antis, PA-C   1 Units at 09/25/23 2212   melatonin tablet 5 mg  5 mg Oral QHS PRN Milinda Antis, PA-C   5 mg at 09/22/23 1946   memantine (NAMENDA) tablet 10 mg  10 mg Oral BID Milinda Antis, PA-C   10 mg at 09/26/23 1610   metFORMIN (GLUCOPHAGE) tablet 500 mg  500 mg Oral BID WC Lovorn, Aundra Millet, MD   500 mg at 09/26/23 0748   methocarbamol (ROBAXIN) tablet 500 mg  500 mg Oral Q6H PRN Milinda Antis, PA-C       ondansetron [facility]) tablet 4 mg  4 mg Oral Q6H PRN Milinda Antis, PA-C       Or   ondansetron [facility]) injection 4 mg  4 mg Intravenous Q6H PRN Milinda Antis, PA-C       oxyCODONE (Oxy IR/ROXICODONE) immediate release tablet 5-10 mg  5-10 mg Oral Q4H PRN Milinda Antis, PA-C   5 mg at 09/23/23 0739   pantoprazole (PROTONIX) EC tablet 40 mg  40 mg Oral Daily Milinda Antis, PA-C   40 mg at 09/26/23 0748   polyethylene glycol (MIRALAX / GLYCOLAX) packet 17 g  17 g Oral Daily PRN Milinda Antis, PA-C       sodium phosphate (FLEET) enema 1 enema  1 enema Rectal Once PRN Milinda Antis, PA-C        Allergies as of 09/21/2023   (No Known Allergies)    Social History   Socioeconomic History   Marital status: Married    Spouse name: Not on file   Number of children: 3   Years of education: Not on file   Highest education level: Not on file  Occupational History   Occupation: Auditor  Tobacco Use   Smoking status: Former    Current packs/day: 0.00    Types: Cigarettes    Quit date: 11/02/1999    Years since quitting: 23.9   Smokeless tobacco: Never  Vaping Use   Vaping status: Never Used  Substance and Sexual Activity   Alcohol use: No   Drug use: No   Sexual activity: Not Currently    Partners: Female  Other Topics Concern   Not on file  Social History Narrative   Daily caffeine       Right handed      Lives with wife in a one story home      Social Determinants of Health    Financial Resource Strain: Low Risk  (04/27/2023)   Overall Financial Resource Strain (CARDIA)    Difficulty of Paying Living Expenses: Not hard at all  Food Insecurity: No Food Insecurity (09/21/2023)   Hunger Vital Sign    Worried About Running Out of Food in the Last Year: Never true    Ran Out of Food in the Last Year: Never true  Transportation Needs: No Transportation Needs (09/21/2023)   PRAPARE - Administrator, Civil Service (Medical): No    Lack of Transportation (Non-Medical): No  Physical Activity: Insufficiently Active (04/27/2023)   Exercise Vital Sign    Days of Exercise per Week: 3 days    Minutes of Exercise per Session: 30 min  Stress: No Stress Concern Present (04/27/2023)   [place]  Institute of Occupational Health - Occupational Stress Questionnaire    Feeling of Stress : Not at all  Social Connections: Socially Integrated (04/27/2023)   Social Connection and Isolation Panel [NHANES]    Frequency of Communication with Friends and Family: More than three times a week    Frequency of Social Gatherings with Friends and Family: More than three times a week    Attends Religious Services: More than 4 times per year    Active Member of [employer] or Organizations: Yes    Attends Engineer, structural: More than 4 times per year    Marital Status: Married  Catering manager Violence: Not At Risk (09/21/2023)   Humiliation, Afraid, Rape, and Kick questionnaire    Fear of Current or Ex-Partner: No    Emotionally Abused: No    Physically Abused: No    Sexually Abused: No     Code Status   Code Status: Limited: Do not attempt resuscitation (DNR) -DNR-LIMITED -Do Not Intubate/DNI   Review of Systems: All systems reviewed and negative except where noted in HPI.  Physical Exam: Vital signs in last 24 hours: Temp:  [98 F (36.7 C)-98.4 F (36.9 C)] 98.2 F (36.8 C) (11/25 0435) Pulse Rate:  [78-81] 81 (11/25 0435) Resp:  [16-18] 18 (11/25 0435) BP:  (142-156)/(54-75) 156/75 (11/25 0435) SpO2:  [97 %-98 %] 98 % (11/25 0435) Last BM Date : 09/25/23  General:  Pleasant thin male in NAD Psych:  Cooperative. Normal mood and affect Eyes: Pupils equal Ears:  Normal auditory acuity Nose: No deformity, discharge or lesions Neck:  Supple, no masses felt Lungs:  Clear to auscultation.  Heart:  Regular rate, regular rhythm.  Abdomen:  Soft, nondistended, nontender, active bowel sounds, no masses felt. Lower abdominal incision with mild erythema, no drainage. Right groin incision also with mild erythema Rectal :  Deferred Msk: Symmetrical without gross deformities.  Neurologic:  Alert, oriented, grossly normal neurologically Extremities : No edema Skin:  Intact without significant lesions.    Intake/Output from previous day: 11/24 0701 - 11/25 0700 In: 1057 [P.O.:1057] Out: 1225 [Urine:1225] Intake/Output this shift:  Total I/O In: 358 [P.O.:358] Out: 175 [Urine:175]   Willette Cluster, NP-C   09/26/2023, 2:01 PM

## 2023-09-27 ENCOUNTER — Inpatient Hospital Stay (HOSPITAL_COMMUNITY): Payer: Medicare HMO

## 2023-09-27 DIAGNOSIS — Z8679 Personal history of other diseases of the circulatory system: Secondary | ICD-10-CM | POA: Diagnosis not present

## 2023-09-27 DIAGNOSIS — D649 Anemia, unspecified: Secondary | ICD-10-CM | POA: Diagnosis not present

## 2023-09-27 DIAGNOSIS — R5381 Other malaise: Secondary | ICD-10-CM | POA: Diagnosis not present

## 2023-09-27 LAB — CBC WITH DIFFERENTIAL/PLATELET
Abs Immature Granulocytes: 0.39 10*3/uL — ABNORMAL HIGH (ref 0.00–0.07)
Basophils Absolute: 0 10*3/uL (ref 0.0–0.1)
Basophils Relative: 0 %
Eosinophils Absolute: 0.2 10*3/uL (ref 0.0–0.5)
Eosinophils Relative: 2 %
HCT: 28.8 % — ABNORMAL LOW (ref 39.0–52.0)
Hemoglobin: 9.3 g/dL — ABNORMAL LOW (ref 13.0–17.0)
Immature Granulocytes: 5 %
Lymphocytes Relative: 14 %
Lymphs Abs: 1.1 10*3/uL (ref 0.7–4.0)
MCH: 29.2 pg (ref 26.0–34.0)
MCHC: 32.3 g/dL (ref 30.0–36.0)
MCV: 90.3 fL (ref 80.0–100.0)
Monocytes Absolute: 0.6 10*3/uL (ref 0.1–1.0)
Monocytes Relative: 7 %
Neutro Abs: 5.7 10*3/uL (ref 1.7–7.7)
Neutrophils Relative %: 72 %
Platelets: 258 10*3/uL (ref 150–400)
RBC: 3.19 MIL/uL — ABNORMAL LOW (ref 4.22–5.81)
RDW: 16.8 % — ABNORMAL HIGH (ref 11.5–15.5)
WBC: 8 10*3/uL (ref 4.0–10.5)
nRBC: 0 % (ref 0.0–0.2)

## 2023-09-27 LAB — GLUCOSE, CAPILLARY
Glucose-Capillary: 71 mg/dL (ref 70–99)
Glucose-Capillary: 116 mg/dL — ABNORMAL HIGH (ref 70–99)
Glucose-Capillary: 141 mg/dL — ABNORMAL HIGH (ref 70–99)

## 2023-09-27 LAB — TYPE AND SCREEN
ABO/RH(D): O POS
Antibody Screen: NEGATIVE
Unit division: 0
Unit division: 0

## 2023-09-27 LAB — BASIC METABOLIC PANEL
Anion gap: 7 (ref 5–15)
BUN: 16 mg/dL (ref 8–23)
CO2: 24 mmol/L (ref 22–32)
Calcium: 8.2 mg/dL — ABNORMAL LOW (ref 8.9–10.3)
Chloride: 108 mmol/L (ref 98–111)
Creatinine, Ser: 0.97 mg/dL (ref 0.61–1.24)
GFR, Estimated: >60 mL/min (ref >60)
Glucose, Bld: 111 mg/dL — ABNORMAL HIGH (ref 70–99)
Potassium: 3.7 mmol/L (ref 3.5–5.1)
Sodium: 139 mmol/L (ref 135–145)

## 2023-09-27 LAB — BPAM RBC
Blood Product Expiration Date: 202412152359
Blood Product Expiration Date: 202412152359
ISSUE DATE / TIME: 202411251600
ISSUE DATE / TIME: 202411251840
Unit Type and Rh: 5100
Unit Type and Rh: 5100

## 2023-09-27 MED ORDER — IOHEXOL 350 MG/ML SOLN
60.0000 mL | Freq: Once | INTRAVENOUS | Status: AC | PRN
  Administered 2023-09-27: 60 mL via INTRAVENOUS

## 2023-09-27 MED ORDER — LOSARTAN POTASSIUM 25 MG PO TABS
25.0000 mg | ORAL_TABLET | Freq: Every day | ORAL | Status: DC
  Administered 2023-09-27 – 2023-09-29 (×3): 25 mg via ORAL
  Filled 2023-09-27 (×3): qty 1

## 2023-09-27 MED ORDER — PANTOPRAZOLE SODIUM 40 MG PO TBEC
40.0000 mg | DELAYED_RELEASE_TABLET | Freq: Two times a day (BID) | ORAL | Status: DC
  Administered 2023-09-27 – 2023-10-04 (×14): 40 mg via ORAL
  Filled 2023-09-27 (×14): qty 1

## 2023-09-27 NOTE — Progress Notes (Signed)
Gastroenterology Inpatient Follow Up    Subjective: No abdominal pain. Feels well. No complaints.  Objective: Vital signs in last 24 hours: Temp:  [97.6 F (36.4 C)-98.8 F (37.1 C)] 97.9 F (36.6 C) (11/26 0428) Pulse Rate:  [72-90] 72 (11/26 0428) Resp:  [17-18] 18 (11/26 0428) BP: (113-149)/(48-66) 148/66 (11/26 0428) SpO2:  [96 %-99 %] 96 % (11/26 0428) Last BM Date : 09/26/23  Intake/Output from previous day: 11/25 0701 - 11/26 0700 In: 1419 [P.O.:798; Blood:621] Out: 525 [Urine:525] Intake/Output this shift: Total I/O In: 240 [P.O.:240] Out: -   General appearance: alert and cooperative Resp: no increased WOB Cardio: regular rate GI: non-tender, non-distended  Lab Results: Recent Labs    09/26/23 0147 09/27/23 0517  WBC 8.2 8.0  HGB 7.1* 9.3*  HCT 22.6* 28.8*  PLT 258 258   BMET Recent Labs    09/26/23 0445 09/27/23 0517  NA 138 139  K 3.5 3.7  CL 106 108  CO2 24 24  GLUCOSE 116* 111*  BUN 17 16  CREATININE 1.31* 0.97  CALCIUM 7.9* 8.2*   LFT No results for input(s): "PROT", "ALBUMIN", "AST", "ALT", "ALKPHOS", "BILITOT", "BILIDIR", "IBILI" in the last 72 hours. PT/INR No results for input(s): "LABPROT", "INR" in the last 72 hours. Hepatitis Panel No results for input(s): "HEPBSAG", "HCVAB", "HEPAIGM", "HEPBIGM" in the last 72 hours. C-Diff No results for input(s): "CDIFFTOX" in the last 72 hours.  Studies/Results: CT ABDOMEN PELVIS W CONTRAST  Result Date: 09/27/2023 CLINICAL DATA:  Worsening anemia, heme-positive stool, concern for aorto enteric fistula EXAM: CT ABDOMEN AND PELVIS WITH CONTRAST TECHNIQUE: Multidetector CT imaging of the abdomen and pelvis was performed using the standard protocol following bolus administration of intravenous contrast. RADIATION DOSE REDUCTION: This exam was performed according to the departmental dose-optimization program which includes automated exposure control, adjustment of the mA and/or kV  according to patient size and/or use of iterative reconstruction technique. CONTRAST:  60mL OMNIPAQUE IOHEXOL 350 MG/ML SOLN COMPARISON:  08/18/2023 FINDINGS: Lower chest: No acute pleural or parenchymal lung disease. Hepatobiliary: No focal liver abnormality is seen. No gallstones, gallbladder wall thickening, or biliary dilatation. Pancreas: Unremarkable. No pancreatic ductal dilatation or surrounding inflammatory changes. Spleen: Normal in size without focal abnormality. Adrenals/Urinary Tract: Stable right renal cyst is not require imaging follow-up. Otherwise the kidneys enhance normally. No urinary tract calculi or obstruction. The adrenals and bladder are unremarkable. Stomach/Bowel: No bowel obstruction or ileus. There is extensive diverticulosis of the colon without evidence of acute diverticulitis. Normal appendix right lower quadrant. I do not see any contrast extravasation within the bowel lumen to suggest aortoenteric fistula or active gastrointestinal bleeding. Persistent small hiatal hernia with mural thickening at the gastroesophageal junction. Vascular/Lymphatic: Interval endoluminal stent graft traversing the known infrarenal abdominal aortic aneurysm. Outer dimension of the aneurysm sac measures 5.9 cm, not appreciably changed since prior study. The aortic stent graft extends into the left common iliac artery. Stent lumen and the left common iliac artery appear widely patent. Extensive atherosclerosis of the left internal iliac artery, which does still appear patent. Embolic coils are seen within the right external iliac artery just beyond the bifurcation. There is occlusion of the right common iliac and right external iliac arteries. Interval femoral-femoral bypass graft appears widely patent. Minimal retrograde flow of contrast is seen into the right common femoral artery proximal to the anastomosis. Visualized proximal outflow branches of the common femoral and superficial femoral arteries are  patent bilaterally, with continued multifocal stenosis within  the left common femoral artery and left superficial femoral artery. Simple appearing fluid is seen surrounding the bilateral common femoral arteries and the femoral-femoral bypass graft, consistent with resolving postoperative seroma or hematoma. Punctate focus of gas within the fluid collection surrounding the bypass graft may be residual postsurgical gas. Sterility of fluid cannot be assessed by CT. Stable IVC filter. Reproductive: Prostate is unremarkable. Other: No free intraperitoneal fluid or free intraperitoneal gas. No abdominal wall hernia. Musculoskeletal: No acute or destructive bony abnormalities. Chronic L1 compression fracture with prior vertebral augmentation. Reconstructed images demonstrate no additional findings. IMPRESSION: 1. Postoperative changes from interval endoluminal stent graft repair, with stent graft extending from the aortic lumen into the left common iliac artery. Embolization and occlusion of the right external iliac and common iliac arteries, with patent femoral-femoral bypass graft as above. 2. Resolving postoperative seroma or hematoma surrounding the femoral-femoral bypass graft and bilateral common femoral arteries as above. Punctate focus of gas within the fluid may reflect residual postoperative gas. Sterility of fluid cannot be assessed by CT. 3. No evidence of contrast extravasation within the bowel lumen to suggest aortoenteric fistula or active gastrointestinal bleeding. If gastrointestinal bleeding remains a concern, nuclear medicine tagged red blood cell scan could be considered. 4. Diffuse colonic diverticulosis without evidence of acute diverticulitis. 5. Persistent mural thickening at the gastroesophageal junction, with evidence of small hiatal hernia. Differential diagnosis remains neoplasm versus sequela of esophagitis/gastritis. Endoscopy recommended if not recently performed. 6. Stable IVC filter.  Electronically Signed   By: Sharlet Salina M.D.   On: 09/27/2023 09:36    Medications: I have reviewed the patient's current medications. Scheduled:  amLODipine  5 mg Oral Daily   aspirin EC  81 mg Oral Daily   atorvastatin  40 mg Oral Daily   calcium carbonate  400 mg of elemental calcium Oral BID WC   docusate sodium  100 mg Oral Daily   ferrous sulfate  325 mg Oral Q breakfast   insulin aspart  0-9 Units Subcutaneous TID AC & HS   losartan  25 mg Oral Daily   memantine  10 mg Oral BID   metFORMIN  500 mg Oral BID WC   pantoprazole  40 mg Oral Daily   Continuous: XBM:[id number], alum & mag hydroxide-simeth, bisacodyl, diphenhydrAMINE, guaiFENesin-dextromethorphan, melatonin, methocarbamol, ondansetron **OR** ondansetron (ZOFRAN) IV, oxyCODONE, polyethylene glycol, sodium phosphate  Assessment/Plan: 77 year old male with history of DM, CAD, AAA, PVD, DVT/PE s/p IVC filter, dementia, GERD, IDA, and small bowel AVM presented originally for AAA repair on 09/16/23. We are consulted due to progressively decreasing hemoglobin. Patient required 3 units of PRBCs for postop anemia and has been getting additional units of PRBCs since then. His hemoglobin has slowly down drifted while he has been in inpatient rehab. Patient recently had an EGD and colonoscopy done in 03/2023 that showed a 3 cm hiatal hernia, duodenal angioectasia s/p APC, nonbleeding duodenal diverticula, and severe diverticulosis of the colon.  CT A/P does not show any signs of aortoenteric fistula or active GI bleeding.  However the patient does have a hematoma surrounding his femoral-femoral bypass graft that could be a source of the patient's blood loss.  CT also does show some persistent mural thickening at the GE junction with evidence of hiatal hernia that could be concerning for esophagitis/gastritis.  Patient's most recent hemoglobin has responded very well with a hemoglobin of 9.3 (up from 7.1) after 2 units of PRBCs.  Because  there is still some concern for esophagitis/gastritis, would  be reasonable to proceed with SBE to ensure that there are no other sources of blood loss.  -Increase PPI from every day to BID -Plan for SBE tomorrow   LOS: 6 days   Imogene Burn 09/27/2023, 10:13 AM

## 2023-09-27 NOTE — Progress Notes (Signed)
Occupational Therapy Weekly Progress Note  Patient Details  Name: Walter [name] MRN: [id number] Date of Birth: 12-19-1945  Beginning of progress report period: September 22, 2023 End of progress report period: September 28, 2023  {CHL IP REHAB OT TIME CALCULATIONS:304400400}   Patient has met {number 1-5:22450} of {number 1-5:20334} short term goals.  ***  Patient continues to demonstrate the following deficits: {impairments:3041632} and therefore will continue to benefit from skilled OT intervention to enhance overall performance with {ADL/iADL:3041649}.  Patient progressing toward long term goals..  Continue plan of care.  OT Short Term Goals Week 1:  OT Short Term Goal 1 (Week 1): Pt will don shirt with mod A OT Short Term Goal 2 (Week 1): Pt will perform sit to stand with close supervision in prep for LB clothing management OT Short Term Goal 3 (Week 1): Pt will performing 1 grooming tasks in standing wtih min A for balance OT Short Term Goal 4 (Week 1): Pt will thread pants with mod A with extra time OT Short Term Goal 5 (Week 1): Pt will come to EOB from flat bed with min A with extra time in prep for ADLs Week 2:     Skilled Therapeutic Interventions/Progress Updates:    Patient agreeable to participate in OT session. Reports *** pain level.   Patient participated in skilled OT session focusing on ***. Therapist facilitated/assessed/developed/educated/integrated/elicited *** in order to improve/facilitate/promote    Therapy Documentation Precautions:  Precautions Precautions: Fall Precaution Comments: Parkinsons Restrictions Weight Bearing Restrictions: No Pain: Pain Assessment Pain Scale: 0-10 Pain Score: 2  ADL: ADL Grooming: Maximal assistance Upper Body Bathing: Minimal assistance Where Assessed-Upper Body Bathing: Shower Lower Body Bathing: Maximal assistance Where Assessed-Lower Body Bathing: Shower Upper Body Dressing: Maximal assistance Where  Assessed-Upper Body Dressing: Wheelchair Lower Body Dressing: Maximal assistance Where Assessed-Lower Body Dressing: Administrator, arts: Walk in shower, Transfer tub bench, Acupuncturist: Moderate assistance Film/video editor Method: Museum/gallery conservator    Praxis   Exercises:   Other Treatments:     Therapy/Group: Individual Therapy  Limmie Patricia, OTR/L,CBIS  Supplemental OT - [facility] Secure Chat Preferred   09/27/2023, 11:17 PM

## 2023-09-27 NOTE — Progress Notes (Signed)
Physical Therapy Session Note  Patient Details  Name: Walter [name] MRN: [id number] Date of Birth: 1946-10-06  Today's Date: 09/27/2023 PT Individual Time: 1100-1128 PT Individual Time Calculation (min): 28 min   Short Term Goals: Week 1:  PT Short Term Goal 1 (Week 1): STG = LTG due to ELOS  Skilled Therapeutic Interventions/Progress Updates:    Session focused on functional transfers with RW, gait training with RW, balance retraining, generalized strengthening, stretching, and transitional movements.Pt performed bed mobility throughout session in hospital bed and then on flat mat table with overall supervision with cues for efficient technique. CGA for basic transfers with RW for balance and cues for RW positioning during turns with transfers for safe placement. Functional gait training with RW on unit x 120' x 2 with cues for increased step length due to shuffled gait pattern and flexed posture with festinating gait - able to correct with consistent verbal cues. Sit <> stands with therapy ball in front of patient with focus on controlled and smooth movements during transitions with eccentric control x 10 reps. In supine engaged in pec stretch and trunk rotation for improved movement quality x 10 reps each direction for rotations. End of session returned back to bed with CGA for transfer and returned to supine with supervision with all needs in reach.   Therapy Documentation Precautions:  Precautions Precautions: Fall Precaution Comments: Parkinsons Restrictions Weight Bearing Restrictions: No  Pain: Denies pain.    Therapy/Group: Individual Therapy  Walter [name], PT, DPT, CBIS  09/27/2023, 11:54 AM

## 2023-09-27 NOTE — Progress Notes (Signed)
Physical Therapy Session Note  Patient Details  Name: Walter [name] MRN: [id number] Date of Birth: [birth year]  Today's Date: 09/27/2023 PT Individual Time: 1305-1416 PT Individual Time Calculation (min): 71 min   Short Term Goals: Week 1:  PT Short Term Goal 1 (Week 1): STG = LTG due to ELOS  Skilled Therapeutic Interventions/Progress Updates:      Therapy Documentation Precautions:  Precautions Precautions: Fall Precaution Comments: Parkinsons Restrictions Weight Bearing Restrictions: No  PT session with emphasis gait training, global strength training and postural control. Pt without reports of pain and supervision for supine to sit and dependent for donning of shoes. Pt CGA/supervision with gait throughout session >75 ft with RW. Pt attempted to have bowel movement, unsuccessful.   Pt transitioned to supine to promote bilateral knee extension and neutral spine as pt typically presents with crouched posture and gait. Pt performed chest opening exercises to increase shoulder mobility:  -red thera band chest fly 3 x 10  -red thera band scaption bilaterally 3 x 10   -red thera band triceps extension 3 x 10 bilaterally  -chest press with 4 # dumb bell   Pt performed 2 x 8 gluteal bridges to improve post chain strength and supine ball toss along with mini squats with dribble of physio to address coordination deficits.   Pt returned to room and left semi-reclined in bed with all needs in reach and alarm on.    Therapy/Group: Individual Therapy  Truitt Leep Truitt Leep PT, DPT  09/27/2023, 7:50 AM

## 2023-09-27 NOTE — Progress Notes (Addendum)
Physical Therapy Session Note  Patient Details  Name: Walter [name] MRN: [id number] Date of Birth: 18-Mar-1946  Today's Date: 09/27/2023 PT Individual Time: 0930-1015 PT Individual Time Calculation (min): 45 min  Today's Date: 09/27/2023 PT Missed Time: 30 Minutes Missed Time Reason: Other (Comment) (off unit for CT scan)  Short Term Goals: Week 1:  PT Short Term Goal 1 (Week 1): STG = LTG due to ELOS  Skilled Therapeutic Interventions/Progress Updates: Patient supine in bed following CT scan (missed time reported) on entrance to room. Patient alert and agreeable to PT session.   Patient reported no pain at beginning of PT session.  Therapeutic Activity: Bed Mobility: Pt performed supine<>sit on EOB with supervision and No VC required.  Transfers: Pt performed sit<>stand transfers throughout session with CGA/close supervision. Provided VC for increased anterior scoot and forward weight shift. Pt did required VC for hand placement when standing from EOB to RW due to either using B UE to stand with 3 failed attempts, then moved to B UE's on RW (able to perform with corrected technique).  - Pt ambulated from EOB to toilet in room to void bladder (charted) with CGA for safety. Pt in bathroom with closed door supervision for privacy. Pt voided bladder and attempted BM but was unable. Pt performed self posterior hygiene to peri-area with residual BM noted. Pt required assistance to finish cleaning with wet wash clothe (all done in standing with CGA). Pt then ambulated to sink to wash hands with CGA, and close supervision while standing at sink. Pt transported from room<>main gym dependently in [facility] for time management.   Neuromuscular Re-ed: NMR facilitated during session with focus on big movements (VC throughout to increase speed and step length during interventions) due to parkinson's, dynamic standing balance and coordination. - sit to stand from [facility] with pt holding yellow physioball and  demonstrative cues for pt to perform explosive stands and tossing ball to wall, catching it, and sitting back down. Pt did 5 reps with light minA due to not being able to use B UE to push, and minA to control descent. Pt cued to anteriorly scoot to edge each time. Pt then performed quick steps to colored discs on floor with B LE's (big cues to step quickly and back to neutral stance).  - Pt performed same step movements to colored discs called out by PTA, then big 180* turn to bounce yellow ball on trampoline x 5. Pt performed 2 rounds and then had a seated rest break. Pt then performed same step to colored discs with same big cues and with rehab tech tossing yellow ball from pt's L lateral side with VC (demonstration provided) for pt to bring ball to lateral R side, and perform big twist to the L tossing to tech with CGA/light minA for safety. Pt performed until close to fatigue and was provided with seated rest break.   NMR performed for improvements in motor control and coordination, balance, sequencing, judgement, and self confidence/ efficacy in performing all aspects of mobility at highest level of independence.   Patient supine in bed at end of session with brakes locked, bed alarm set, and all needs within reach.      Therapy Documentation Precautions:  Precautions Precautions: Fall Precaution Comments: Parkinsons Restrictions Weight Bearing Restrictions: No   Therapy/Group: Individual Therapy  Tawanda Schall PTA 09/27/2023, 12:29 PM

## 2023-09-27 NOTE — Progress Notes (Signed)
Occupational Therapy Session Note  Patient Details  Name: Walter [name] MRN: [id number] Date of Birth: 12/19/1945  Today's Date: 09/27/2023 OT Individual Time: 9562-1308 OT Individual Time Calculation (min): 20 min    Short Term Goals: Week 1:  OT Short Term Goal 1 (Week 1): Pt will don shirt with mod A OT Short Term Goal 2 (Week 1): Pt will perform sit to stand with close supervision in prep for LB clothing management OT Short Term Goal 3 (Week 1): Pt will performing 1 grooming tasks in standing wtih min A for balance OT Short Term Goal 4 (Week 1): Pt will thread pants with mod A with extra time OT Short Term Goal 5 (Week 1): Pt will come to EOB from flat bed with min A with extra time in prep for ADLs Week 2:     Skilled Therapeutic Interventions/Progress Updates:    1:1 Pt received in the bed. PT came to the EOB with supervision. Pt provided with clothes and pt able to dress sitting the EOB with min guard for dynamic sitting balance.  Pt able to perform sit to stand at EOB for lower  body dressing with close supervision. Ambulated to the sink with RW with min guard with cues for step length for safety. Then radiology came to take the patient for imaging. Returned to bed with supervision and tech took him. Missed 10 min.   Therapy Documentation Precautions:  Precautions Precautions: Fall Precaution Comments: Parkinsons Restrictions Weight Bearing Restrictions: No  Pain:     Therapy/Group: Individual Therapy  Roney Mans [facility] 09/27/2023, 3:37 PM

## 2023-09-27 NOTE — Progress Notes (Addendum)
Patient ID: Walter [name], male   DOB: Aug 29, 1946, 77 y.o.   MRN: [id number]  Left message with wife to give team conference update regarding goals of supervision level and discharge date 12/3. Updated pt but will await wife's return call  1;45 Pm Spoke with wife to give her the team conference update regarding goals and discharge date of 12/3. She hopes they can find where the blood is going too before they let him leave the hospital. Aware of test being done tomorrow. She also wanted pt to call her at home due to very important. Went to pt's room where he was with PT asked him to call home-per wife's request.

## 2023-09-27 NOTE — H&P (View-Only) (Signed)
Gastroenterology Inpatient Follow Up    Subjective: No abdominal pain. Feels well. No complaints.  Objective: Vital signs in last 24 hours: Temp:  [97.6 F (36.4 C)-98.8 F (37.1 C)] 97.9 F (36.6 C) (11/26 0428) Pulse Rate:  [72-90] 72 (11/26 0428) Resp:  [17-18] 18 (11/26 0428) BP: (113-149)/(48-66) 148/66 (11/26 0428) SpO2:  [96 %-99 %] 96 % (11/26 0428) Last BM Date : 09/26/23  Intake/Output from previous day: 11/25 0701 - 11/26 0700 In: 1419 [P.O.:798; Blood:621] Out: 525 [Urine:525] Intake/Output this shift: Total I/O In: 240 [P.O.:240] Out: -   General appearance: alert and cooperative Resp: no increased WOB Cardio: regular rate GI: non-tender, non-distended  Lab Results: Recent Labs    09/26/23 0147 09/27/23 0517  WBC 8.2 8.0  HGB 7.1* 9.3*  HCT 22.6* 28.8*  PLT 258 258   BMET Recent Labs    09/26/23 0445 09/27/23 0517  NA 138 139  K 3.5 3.7  CL 106 108  CO2 24 24  GLUCOSE 116* 111*  BUN 17 16  CREATININE 1.31* 0.97  CALCIUM 7.9* 8.2*   LFT No results for input(s): "PROT", "ALBUMIN", "AST", "ALT", "ALKPHOS", "BILITOT", "BILIDIR", "IBILI" in the last 72 hours. PT/INR No results for input(s): "LABPROT", "INR" in the last 72 hours. Hepatitis Panel No results for input(s): "HEPBSAG", "HCVAB", "HEPAIGM", "HEPBIGM" in the last 72 hours. C-Diff No results for input(s): "CDIFFTOX" in the last 72 hours.  Studies/Results: CT ABDOMEN PELVIS W CONTRAST  Result Date: 09/27/2023 CLINICAL DATA:  Worsening anemia, heme-positive stool, concern for aorto enteric fistula EXAM: CT ABDOMEN AND PELVIS WITH CONTRAST TECHNIQUE: Multidetector CT imaging of the abdomen and pelvis was performed using the standard protocol following bolus administration of intravenous contrast. RADIATION DOSE REDUCTION: This exam was performed according to the departmental dose-optimization program which includes automated exposure control, adjustment of the mA and/or kV  according to patient size and/or use of iterative reconstruction technique. CONTRAST:  60mL OMNIPAQUE IOHEXOL 350 MG/ML SOLN COMPARISON:  08/18/2023 FINDINGS: Lower chest: No acute pleural or parenchymal lung disease. Hepatobiliary: No focal liver abnormality is seen. No gallstones, gallbladder wall thickening, or biliary dilatation. Pancreas: Unremarkable. No pancreatic ductal dilatation or surrounding inflammatory changes. Spleen: Normal in size without focal abnormality. Adrenals/Urinary Tract: Stable right renal cyst is not require imaging follow-up. Otherwise the kidneys enhance normally. No urinary tract calculi or obstruction. The adrenals and bladder are unremarkable. Stomach/Bowel: No bowel obstruction or ileus. There is extensive diverticulosis of the colon without evidence of acute diverticulitis. Normal appendix right lower quadrant. I do not see any contrast extravasation within the bowel lumen to suggest aortoenteric fistula or active gastrointestinal bleeding. Persistent small hiatal hernia with mural thickening at the gastroesophageal junction. Vascular/Lymphatic: Interval endoluminal stent graft traversing the known infrarenal abdominal aortic aneurysm. Outer dimension of the aneurysm sac measures 5.9 cm, not appreciably changed since prior study. The aortic stent graft extends into the left common iliac artery. Stent lumen and the left common iliac artery appear widely patent. Extensive atherosclerosis of the left internal iliac artery, which does still appear patent. Embolic coils are seen within the right external iliac artery just beyond the bifurcation. There is occlusion of the right common iliac and right external iliac arteries. Interval femoral-femoral bypass graft appears widely patent. Minimal retrograde flow of contrast is seen into the right common femoral artery proximal to the anastomosis. Visualized proximal outflow branches of the common femoral and superficial femoral arteries are  patent bilaterally, with continued multifocal stenosis within  the left common femoral artery and left superficial femoral artery. Simple appearing fluid is seen surrounding the bilateral common femoral arteries and the femoral-femoral bypass graft, consistent with resolving postoperative seroma or hematoma. Punctate focus of gas within the fluid collection surrounding the bypass graft may be residual postsurgical gas. Sterility of fluid cannot be assessed by CT. Stable IVC filter. Reproductive: Prostate is unremarkable. Other: No free intraperitoneal fluid or free intraperitoneal gas. No abdominal wall hernia. Musculoskeletal: No acute or destructive bony abnormalities. Chronic L1 compression fracture with prior vertebral augmentation. Reconstructed images demonstrate no additional findings. IMPRESSION: 1. Postoperative changes from interval endoluminal stent graft repair, with stent graft extending from the aortic lumen into the left common iliac artery. Embolization and occlusion of the right external iliac and common iliac arteries, with patent femoral-femoral bypass graft as above. 2. Resolving postoperative seroma or hematoma surrounding the femoral-femoral bypass graft and bilateral common femoral arteries as above. Punctate focus of gas within the fluid may reflect residual postoperative gas. Sterility of fluid cannot be assessed by CT. 3. No evidence of contrast extravasation within the bowel lumen to suggest aortoenteric fistula or active gastrointestinal bleeding. If gastrointestinal bleeding remains a concern, nuclear medicine tagged red blood cell scan could be considered. 4. Diffuse colonic diverticulosis without evidence of acute diverticulitis. 5. Persistent mural thickening at the gastroesophageal junction, with evidence of small hiatal hernia. Differential diagnosis remains neoplasm versus sequela of esophagitis/gastritis. Endoscopy recommended if not recently performed. 6. Stable IVC filter.  Electronically Signed   By: Sharlet Salina M.D.   On: 09/27/2023 09:36    Medications: I have reviewed the patient's current medications. Scheduled:  amLODipine  5 mg Oral Daily   aspirin EC  81 mg Oral Daily   atorvastatin  40 mg Oral Daily   calcium carbonate  400 mg of elemental calcium Oral BID WC   docusate sodium  100 mg Oral Daily   ferrous sulfate  325 mg Oral Q breakfast   insulin aspart  0-9 Units Subcutaneous TID AC & HS   losartan  25 mg Oral Daily   memantine  10 mg Oral BID   metFORMIN  500 mg Oral BID WC   pantoprazole  40 mg Oral Daily   Continuous: WUJ:[id number], alum & mag hydroxide-simeth, bisacodyl, diphenhydrAMINE, guaiFENesin-dextromethorphan, melatonin, methocarbamol, ondansetron **OR** ondansetron (ZOFRAN) IV, oxyCODONE, polyethylene glycol, sodium phosphate  Assessment/Plan: 77 year old male with history of DM, CAD, AAA, PVD, DVT/PE s/p IVC filter, dementia, GERD, IDA, and small bowel AVM presented originally for AAA repair on 09/16/23. We are consulted due to progressively decreasing hemoglobin. Patient required 3 units of PRBCs for postop anemia and has been getting additional units of PRBCs since then. His hemoglobin has slowly down drifted while he has been in inpatient rehab. Patient recently had an EGD and colonoscopy done in 03/2023 that showed a 3 cm hiatal hernia, duodenal angioectasia s/p APC, nonbleeding duodenal diverticula, and severe diverticulosis of the colon.  CT A/P does not show any signs of aortoenteric fistula or active GI bleeding.  However the patient does have a hematoma surrounding his femoral-femoral bypass graft that could be a source of the patient's blood loss.  CT also does show some persistent mural thickening at the GE junction with evidence of hiatal hernia that could be concerning for esophagitis/gastritis.  Patient's most recent hemoglobin has responded very well with a hemoglobin of 9.3 (up from 7.1) after 2 units of PRBCs.  Because  there is still some concern for esophagitis/gastritis, would  be reasonable to proceed with SBE to ensure that there are no other sources of blood loss.  -Increase PPI from every day to BID -Plan for SBE tomorrow   LOS: 6 days   Imogene Burn 09/27/2023, 10:13 AM

## 2023-09-27 NOTE — Progress Notes (Signed)
PROGRESS NOTE   Subjective/Complaints:  Pt reports feeling better-  Had some neck pain overnight, but better this AM.  LBM yesterday.    ROS:   Pt denies SOB, abd pain, CP, N/V/C/D, and vision changes  Except for HPI  Objective:   No results found. Recent Labs    09/26/23 0147 09/27/23 0517  WBC 8.2 8.0  HGB 7.1* 9.3*  HCT 22.6* 28.8*  PLT 258 258   Recent Labs    09/26/23 0445 09/27/23 0517  NA 138 139  K 3.5 3.7  CL 106 108  CO2 24 24  GLUCOSE 116* 111*  BUN 17 16  CREATININE 1.31* 0.97  CALCIUM 7.9* 8.2*    Intake/Output Summary (Last 24 hours) at 09/27/2023 0858 Last data filed at 09/27/2023 1610 Gross per 24 hour  Intake 1301 ml  Output 350 ml  Net 951 ml        Physical Exam: Vital Signs Blood pressure (!) 148/66, pulse 72, temperature 97.9 F (36.6 C), temperature source Oral, resp. rate 18, height 5\' 7"  (1.702 m), weight 66.7 kg, SpO2 96%.     General: awake, alert, appropriate,  sitting up in bed- ate 100% tray; NAD HENT: conjugate gaze; oropharynx moist CV: regular rate and rhythm; no JVD Pulmonary: CTA B/L; no W/R/R- good air movement GI: soft, NT, ND, (+)BS- normoactive Psychiatric: appropriate- interactive Neurological: alert   Skin; Abdominal incision glued- looks good- no drainage- only localized erythema around glue-looks great- healing so well    Assessment/Plan: 1. Functional deficits which require 3+ hours per day of interdisciplinary therapy in a comprehensive inpatient rehab setting. Physiatrist is providing close team supervision and 24 hour management of active medical problems listed below. Physiatrist and rehab team continue to assess barriers to discharge/monitor patient progress toward functional and medical goals  Care Tool:  Bathing    Body parts bathed by patient: Right arm, Left arm, Chest, Abdomen, Front perineal area, Right upper leg, Left upper  leg, Face   Body parts bathed by helper: Left lower leg, Right lower leg, Buttocks     Bathing assist Assist Level: Minimal Assistance - Patient > 75%     Upper Body Dressing/Undressing Upper body dressing   What is the patient wearing?: Pull over shirt    Upper body assist Assist Level: Contact Guard/Touching assist    Lower Body Dressing/Undressing Lower body dressing      What is the patient wearing?: Pants, Incontinence brief     Lower body assist Assist for lower body dressing: Maximal Assistance - Patient 25 - 49%     Toileting Toileting Toileting Activity did not occur (Clothing management and hygiene only): N/A (no void or bm)  Toileting assist Assist for toileting: Maximal Assistance - Patient 25 - 49%     Transfers Chair/bed transfer  Transfers assist     Chair/bed transfer assist level: Minimal Assistance - Patient > 75%     Locomotion Ambulation   Ambulation assist      Assist level: Minimal Assistance - Patient > 75% Assistive device: Walker-rolling Max distance: 120   Walk 10 feet activity   Assist     Assist level: Minimal Assistance -  Patient > 75% Assistive device: Walker-rolling   Walk 50 feet activity   Assist    Assist level: Minimal Assistance - Patient > 75% Assistive device: Walker-rolling    Walk 150 feet activity   Assist    Assist level: Minimal Assistance - Patient > 75% Assistive device: Walker-rolling    Walk 10 feet on uneven surface  activity   Assist     Assist level: Minimal Assistance - Patient > 75% Assistive device: Walker-rolling   Wheelchair     Assist Is the patient using a wheelchair?: No             Wheelchair 50 feet with 2 turns activity    Assist            Wheelchair 150 feet activity     Assist          Blood pressure (!) 148/66, pulse 72, temperature 97.9 F (36.6 C), temperature source Oral, resp. rate 18, height 5\' 7"  (1.702 m), weight 66.7 kg,  SpO2 96%.  Medical Problem List and Plan: Debility following AAA repair             -patient may shower             -ELOS/Goals: S 10-14 days           Con't CIR PT and OT  Team conference today to determine length of stay 2.  Antithrombotics: -DVT/anticoagulation:  Mechanical: Sequential compression devices, below knee Bilateral lower extremities 11/21-11/22 not on Lovenox due to drop in Hb as well as low plts              -antiplatelet therapy: Aspirin 81 mg daily   3. Pain Management: Tylenol, oxycodone, and robaxin as needed   11/22- asked nursing to bring pain meds for neck pain  11/25- denies pain this AM 4. Mood/Behavior/Sleep: LCSW to evaluate and provide emotional support             -antipsychotic agents: n/a   5. Neuropsych/cognition: This patient is not capable of making decisions on his own behalf. Due to dementia   6. Skin/Wound Care: Routine skin care checks   7. Fluids/Electrolytes/Nutrition: Routine Is and Os and follow-up chemistries             -dysphagia 3 diet with thin liquids/carb modified   8: Hypertension: monitor TID and prn             -home losartan 100 mg daily and amlodipine 5 mg daily held  11/22- BP somewhat elevated for last 24 hours- will restart home Norvasc 5 mg daily and monitor 11-23: Got a single dose of amlodipine 10 mg exam; reduce back down to 5, monitor 2 to 3 days before further increase -09/25/23 BP a little variable, monitor trend with med adjustment 11/25- BP running 130s to 150s systolic- if still up tomorrow- will add back Losartan- home medicine.  11/26- Add back Losartan 25 mg daily for BP running 130s-140s'  Vitals:   09/24/23 1944 09/25/23 0456 09/25/23 1639 09/25/23 2038  BP: (!) 152/64 (!) 157/68 (!) 143/54 (!) 142/66   09/26/23 0435 09/26/23 1542 09/26/23 1552 09/26/23 1610  BP: (!) 156/75 (!) 130/58 (!) 120/48 (!) 120/48   09/26/23 1626 09/26/23 1958 09/26/23 2205 09/27/23 0428  BP: 113/63 (!) 144/64 (!) 149/64 (!)  148/66    9: 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  Randie Heinz.             -continue aspirin             -follow-up [facility]   10: AKI: improving    11/22- Cr down to 1.06- doing better 11: ABLA/chronic anemia: downward drift in H and H over last 48 hours, continue iron 11/21- Hb down to 7.3 from 8.8- in 2 days- will recheck in AM- check FOBT daily x 3 days, start Fe sulfate 325 mg qday.  Had colonoscopy in Spring 2024- no issues- will also call [facility] to inform them -they don't want any additional imaging at this time.  11/22- no BM to do hemoccult- also Hb up to 7.5 today- will recheck Monday and f/u on this.  -09/25/23 no FOBT done, even though he's had BMs; asked nursing to please obtain this. Labs ordered for tomorrow already.  11/25- FOBT (+)- and Hb down to 7.1- however no Increase in BUN to signal fast GI bleed- will transfuse if pt will allow- D/w PA- and she will do transfusion if pt allows. 11/26- pt s/p 2 units pRBCs- up to 9.3- per GI, would order CT of abd/pelvis with contrast- ordered this AM- to look fo raorto-enteric fistula- or intra-abdominal source of bleeding.   12: Prediabetes: home metformin 1000 mg held             -continue SSI 11/21- well controlled- con't regimen- will restart Metformin 500 mg BID- since renal function doing better- wife doesn't want him receiving insulin by d/c (he's only on SSI).  11/22- BG's running 118 to 180's- will con't Metformin- home dose 1000 mg BID- but will wait to increase for a few days.   11-22: Blood sugars well-controlled, monitor  -09/25/23 CBGs mostly <200 or close to it; monitor  11/26- Cbgs controlled- con't regimen Recent Labs    09/26/23 1207 09/26/23 1650 09/26/23 2134  GLUCAP 100* 163* 114*     13: Dementia: continue Namenda 10mg  BID   14: History of duodenal angiectasia/GERD: continue protonix 40mg  daily   15: CAD s/p PCI in  2004   16: Hyperlipidemia: continue atorvastatin 40mg  daily  17. Hypokalemia  11/21- K+ 3.4- will replete with 40 mEq x1 and recheck in AM  11/22- K+ up to 3.9  11/25- K+ 3.5  11/26- K+ 3.7 18. Thrombocytopenia 11/22- plts up to 160k from low of 86k- but will not restart Lovenox since Hb so low.   11/25- Back up to 258k 19. Constipation: on colace 100mg  daily  11/22- LBM 2 days ago- if no BM by tomorrow, will need intervention  -09/25/23 BMs yesterday and today; monitor  11/25- LBM yesterday 20 Hypocalcemia  11/25- will add Tums- 400 units BID with meals and recheck Thursday  11/26- Ca 8.2 this AM- improving   I spent a total of 43   minutes on total care today- >50% coordination of care- due to  D/w PA about work up from GI for ABLA- also  Hb up to 9.3 and AKI has resolved from yesterday- Cr back down to <1.0-     LOS: 6 days A FACE TO FACE EVALUATION WAS PERFORMED  Walter [name] 09/27/2023, 8:58 AM

## 2023-09-28 ENCOUNTER — Other Ambulatory Visit: Payer: Self-pay

## 2023-09-28 ENCOUNTER — Encounter (HOSPITAL_COMMUNITY): Payer: Self-pay | Admitting: Physical Medicine and Rehabilitation

## 2023-09-28 ENCOUNTER — Inpatient Hospital Stay (HOSPITAL_COMMUNITY): Payer: Medicare HMO | Admitting: Anesthesiology

## 2023-09-28 ENCOUNTER — Encounter (HOSPITAL_COMMUNITY)
Admission: AD | Disposition: A | Discharge status: Home Health | Payer: Self-pay | Source: Intra-hospital | Attending: Physical Medicine and Rehabilitation

## 2023-09-28 DIAGNOSIS — R5381 Other malaise: Secondary | ICD-10-CM | POA: Diagnosis not present

## 2023-09-28 DIAGNOSIS — K5520 Angiodysplasia of colon without hemorrhage: Secondary | ICD-10-CM | POA: Diagnosis not present

## 2023-09-28 DIAGNOSIS — K449 Diaphragmatic hernia without obstruction or gangrene: Secondary | ICD-10-CM | POA: Diagnosis not present

## 2023-09-28 DIAGNOSIS — D649 Anemia, unspecified: Secondary | ICD-10-CM

## 2023-09-28 HISTORY — PX: HOT HEMOSTASIS: SHX5433

## 2023-09-28 HISTORY — PX: ENTEROSCOPY: SHX5533

## 2023-09-28 LAB — GLUCOSE, CAPILLARY
Glucose-Capillary: 110 mg/dL — ABNORMAL HIGH (ref 70–99)
Glucose-Capillary: 112 mg/dL — ABNORMAL HIGH (ref 70–99)
Glucose-Capillary: 102 mg/dL — ABNORMAL HIGH (ref 70–99)
Glucose-Capillary: 125 mg/dL — ABNORMAL HIGH (ref 70–99)
Glucose-Capillary: 212 mg/dL — ABNORMAL HIGH (ref 70–99)

## 2023-09-28 LAB — CBC
HCT: 31.9 % — ABNORMAL LOW (ref 39.0–52.0)
Hemoglobin: 10.1 g/dL — ABNORMAL LOW (ref 13.0–17.0)
MCH: 28.7 pg (ref 26.0–34.0)
MCHC: 31.7 g/dL (ref 30.0–36.0)
MCV: 90.6 fL (ref 80.0–100.0)
Platelets: 301 10*3/uL (ref 150–400)
RBC: 3.52 MIL/uL — ABNORMAL LOW (ref 4.22–5.81)
RDW: 16.1 % — ABNORMAL HIGH (ref 11.5–15.5)
WBC: 9 10*3/uL (ref 4.0–10.5)
nRBC: 0 % (ref 0.0–0.2)

## 2023-09-28 SURGERY — ENTEROSCOPY
Anesthesia: Monitor Anesthesia Care

## 2023-09-28 MED ORDER — LIDOCAINE 2% (20 MG/ML) 5 ML SYRINGE
INTRAMUSCULAR | Status: DC | PRN
  Administered 2023-09-28: 8 mg via INTRAVENOUS

## 2023-09-28 MED ORDER — SODIUM CHLORIDE 0.9 % IV SOLN: INTRAVENOUS | Status: DC | PRN

## 2023-09-28 MED ORDER — ONDANSETRON HCL 4 MG/2ML IJ SOLN
INTRAMUSCULAR | Status: DC | PRN
  Administered 2023-09-28: 4 mg via INTRAVENOUS

## 2023-09-28 MED ORDER — PROPOFOL 10 MG/ML IV BOLUS
INTRAVENOUS | Status: DC | PRN
  Administered 2023-09-28: 30 mg via INTRAVENOUS
  Administered 2023-09-28: 20 mg via INTRAVENOUS
  Administered 2023-09-28: 30 mg via INTRAVENOUS
  Administered 2023-09-28 (×9): 20 mg via INTRAVENOUS

## 2023-09-28 NOTE — Progress Notes (Signed)
PROGRESS NOTE   Subjective/Complaints:  Feels OK- taking things "one day at a time".   LBM yesterday Is NPO this AM for SBE.  Small intestine to be looked at.   ROS:   Pt denies SOB, abd pain, CP, N/V/C/D, and vision changes   Except for HPI  Objective:   CT ABDOMEN PELVIS W CONTRAST  Result Date: 09/27/2023 CLINICAL DATA:  Worsening anemia, heme-positive stool, concern for aorto enteric fistula EXAM: CT ABDOMEN AND PELVIS WITH CONTRAST TECHNIQUE: Multidetector CT imaging of the abdomen and pelvis was performed using the standard protocol following bolus administration of intravenous contrast. RADIATION DOSE REDUCTION: This exam was performed according to the departmental dose-optimization program which includes automated exposure control, adjustment of the mA and/or kV according to patient size and/or use of iterative reconstruction technique. CONTRAST:  60mL OMNIPAQUE IOHEXOL 350 MG/ML SOLN COMPARISON:  08/18/2023 FINDINGS: Lower chest: No acute pleural or parenchymal lung disease. Hepatobiliary: No focal liver abnormality is seen. No gallstones, gallbladder wall thickening, or biliary dilatation. Pancreas: Unremarkable. No pancreatic ductal dilatation or surrounding inflammatory changes. Spleen: Normal in size without focal abnormality. Adrenals/Urinary Tract: Stable right renal cyst is not require imaging follow-up. Otherwise the kidneys enhance normally. No urinary tract calculi or obstruction. The adrenals and bladder are unremarkable. Stomach/Bowel: No bowel obstruction or ileus. There is extensive diverticulosis of the colon without evidence of acute diverticulitis. Normal appendix right lower quadrant. I do not see any contrast extravasation within the bowel lumen to suggest aortoenteric fistula or active gastrointestinal bleeding. Persistent small hiatal hernia with mural thickening at the gastroesophageal junction.  Vascular/Lymphatic: Interval endoluminal stent graft traversing the known infrarenal abdominal aortic aneurysm. Outer dimension of the aneurysm sac measures 5.9 cm, not appreciably changed since prior study. The aortic stent graft extends into the left common iliac artery. Stent lumen and the left common iliac artery appear widely patent. Extensive atherosclerosis of the left internal iliac artery, which does still appear patent. Embolic coils are seen within the right external iliac artery just beyond the bifurcation. There is occlusion of the right common iliac and right external iliac arteries. Interval femoral-femoral bypass graft appears widely patent. Minimal retrograde flow of contrast is seen into the right common femoral artery proximal to the anastomosis. Visualized proximal outflow branches of the common femoral and superficial femoral arteries are patent bilaterally, with continued multifocal stenosis within the left common femoral artery and left superficial femoral artery. Simple appearing fluid is seen surrounding the bilateral common femoral arteries and the femoral-femoral bypass graft, consistent with resolving postoperative seroma or hematoma. Punctate focus of gas within the fluid collection surrounding the bypass graft may be residual postsurgical gas. Sterility of fluid cannot be assessed by CT. Stable IVC filter. Reproductive: Prostate is unremarkable. Other: No free intraperitoneal fluid or free intraperitoneal gas. No abdominal wall hernia. Musculoskeletal: No acute or destructive bony abnormalities. Chronic L1 compression fracture with prior vertebral augmentation. Reconstructed images demonstrate no additional findings. IMPRESSION: 1. Postoperative changes from interval endoluminal stent graft repair, with stent graft extending from the aortic lumen into the left common iliac artery. Embolization and occlusion of the right external iliac and common iliac arteries, with patent  femoral-femoral bypass graft as above. 2. Resolving postoperative seroma or hematoma surrounding the femoral-femoral bypass graft and bilateral common femoral arteries as above. Punctate focus of gas within the fluid may reflect residual postoperative gas. Sterility of fluid cannot be assessed by CT. 3. No evidence of contrast extravasation within the bowel lumen to suggest aortoenteric fistula or active gastrointestinal bleeding. If gastrointestinal bleeding remains a concern, nuclear medicine tagged red blood cell scan could be considered. 4. Diffuse colonic diverticulosis without evidence of acute diverticulitis. 5. Persistent mural thickening at the gastroesophageal junction, with evidence of small hiatal hernia. Differential diagnosis remains neoplasm versus sequela of esophagitis/gastritis. Endoscopy recommended if not recently performed. 6. Stable IVC filter. Electronically Signed   By: Sharlet Salina M.D.   On: 09/27/2023 09:36   Recent Labs    09/26/23 0147 09/27/23 0517  WBC 8.2 8.0  HGB 7.1* 9.3*  HCT 22.6* 28.8*  PLT 258 258   Recent Labs    09/26/23 0445 09/27/23 0517  NA 138 139  K 3.5 3.7  CL 106 108  CO2 24 24  GLUCOSE 116* 111*  BUN 17 16  CREATININE 1.31* 0.97  CALCIUM 7.9* 8.2*    Intake/Output Summary (Last 24 hours) at 09/28/2023 1610 Last data filed at 09/28/2023 9604 Gross per 24 hour  Intake 480 ml  Output 1200 ml  Net -720 ml        Physical Exam: Vital Signs Blood pressure (!) 151/68, pulse 67, temperature 97.6 F (36.4 C), resp. rate 17, height 5\' 7"  (1.702 m), weight 66.7 kg, SpO2 97%.      General: awake, alert, appropriate, just woke up- supine in bed; NAD HENT: conjugate gaze; oropharynx moist CV: regular rate and rhythm; no JVD Pulmonary: CTA B/L; no W/R/R- good air movement GI: soft, NT, ND, (+)BS- hypoactive Psychiatric: appropriate Neurological: alert  Skin; Abdominal incision glued- looks good- no drainage- only localized erythema  around glue-looks great- healing great    Assessment/Plan: 1. Functional deficits which require 3+ hours per day of interdisciplinary therapy in a comprehensive inpatient rehab setting. Physiatrist is providing close team supervision and 24 hour management of active medical problems listed below. Physiatrist and rehab team continue to assess barriers to discharge/monitor patient progress toward functional and medical goals  Care Tool:  Bathing    Body parts bathed by patient: Right arm, Left arm, Chest, Abdomen, Front perineal area, Right upper leg, Left upper leg, Face   Body parts bathed by helper: Left lower leg, Right lower leg, Buttocks     Bathing assist Assist Level: Minimal Assistance - Patient > 75%     Upper Body Dressing/Undressing Upper body dressing   What is the patient wearing?: Pull over shirt    Upper body assist Assist Level: Contact Guard/Touching assist    Lower Body Dressing/Undressing Lower body dressing      What is the patient wearing?: Pants, Incontinence brief     Lower body assist Assist for lower body dressing: Maximal Assistance - Patient 25 - 49%     Toileting Toileting Toileting Activity did not occur (Clothing management and hygiene only): N/A (no void or bm)  Toileting assist Assist for toileting: Maximal Assistance - Patient 25 - 49%     Transfers Chair/bed transfer  Transfers assist     Chair/bed transfer assist level: Contact Guard/Touching assist     Locomotion Ambulation   Ambulation assist      Assist level: Minimal Assistance - Patient > 75% Assistive device: Walker-rolling Max  distance: 120   Walk 10 feet activity   Assist     Assist level: Contact Guard/Touching assist Assistive device: Walker-rolling   Walk 50 feet activity   Assist    Assist level: Minimal Assistance - Patient > 75% Assistive device: Walker-rolling    Walk 150 feet activity   Assist    Assist level: Minimal Assistance -  Patient > 75% Assistive device: Walker-rolling    Walk 10 feet on uneven surface  activity   Assist     Assist level: Minimal Assistance - Patient > 75% Assistive device: Walker-rolling   Wheelchair     Assist Is the patient using a wheelchair?: No             Wheelchair 50 feet with 2 turns activity    Assist            Wheelchair 150 feet activity     Assist          Blood pressure (!) 151/68, pulse 67, temperature 97.6 F (36.4 C), resp. rate 17, height 5\' 7"  (1.702 m), weight 66.7 kg, SpO2 97%.  Medical Problem List and Plan: Debility following AAA repair             -patient may shower             -ELOS/Goals: S 10-14 days           D/c 12/3  Con't CIR PT and OT  SBE today-  2.  Antithrombotics: -DVT/anticoagulation:  Mechanical: Sequential compression devices, below knee Bilateral lower extremities 11/21-11/22 not on Lovenox due to drop in Hb as well as low plts              -antiplatelet therapy: Aspirin 81 mg daily   3. Pain Management: Tylenol, oxycodone, and robaxin as needed   11/22- asked nursing to bring pain meds for neck pain  11/25- denies pain this AM 4. Mood/Behavior/Sleep: LCSW to evaluate and provide emotional support             -antipsychotic agents: n/a   5. Neuropsych/cognition: This patient is not capable of making decisions on his own behalf. Due to dementia   6. Skin/Wound Care: Routine skin care checks   7. Fluids/Electrolytes/Nutrition: Routine Is and Os and follow-up chemistries             -dysphagia 3 diet with thin liquids/carb modified   8: Hypertension: monitor TID and prn             -home losartan 100 mg daily and amlodipine 5 mg daily held  11/22- BP somewhat elevated for last 24 hours- will restart home Norvasc 5 mg daily and monitor 11-23: Got a single dose of amlodipine 10 mg exam; reduce back down to 5, monitor 2 to 3 days before further increase -09/25/23 BP a little variable, monitor trend  with med adjustment 11/25- BP running 130s to 150s systolic- if still up tomorrow- will add back Losartan- home medicine.  11/26- Add back Losartan 25 mg daily for BP running 130s-140s'  11/27- BP a little elevated this AM- in 140s-150s- but is NPO- will mointor for trend Vitals:   09/25/23 2038 09/26/23 0435 09/26/23 1542 09/26/23 1552  BP: (!) 142/66 (!) 156/75 (!) 130/58 (!) 120/48   09/26/23 1610 09/26/23 1626 09/26/23 1958 09/26/23 2205  BP: (!) 120/48 113/63 (!) 144/64 (!) 149/64   09/27/23 0428 09/27/23 1417 09/27/23 1935 09/28/23 0311  BP: (!) 148/66 (!) 152/67 (!) 152/52 Marland Kitchen)  151/68    9: 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 Randie Heinz.             -continue aspirin             -follow-up [facility]   10: AKI: improving    11/22- Cr down to 1.06- doing better 11: ABLA/chronic anemia: downward drift in H and H over last 48 hours, continue iron 11/21- Hb down to 7.3 from 8.8- in 2 days- will recheck in AM- check FOBT daily x 3 days, start Fe sulfate 325 mg qday.  Had colonoscopy in Spring 2024- no issues- will also call [facility] to inform them -they don't want any additional imaging at this time.  11/22- no BM to do hemoccult- also Hb up to 7.5 today- will recheck Monday and f/u on this.  -09/25/23 no FOBT done, even though he's had BMs; asked nursing to please obtain this. Labs ordered for tomorrow already.  11/25- FOBT (+)- and Hb down to 7.1- however no Increase in BUN to signal fast GI bleed- will transfuse if pt will allow- D/w PA- and she will do transfusion if pt allows. 11/26- pt s/p 2 units pRBCs- up to 9.3- per GI, would order CT of abd/pelvis with contrast- ordered this AM- to look fo raorto-enteric fistula- or intra-abdominal source of bleeding. 11/27- SBE due today- is NPO for it- will check labs this AM and tomorrow   12: Prediabetes: home metformin 1000 mg held             -continue  SSI 11/21- well controlled- con't regimen- will restart Metformin 500 mg BID- since renal function doing better- wife doesn't want him receiving insulin by d/c (he's only on SSI).  11/22- BG's running 118 to 180's- will con't Metformin- home dose 1000 mg BID- but will wait to increase for a few days.   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 Hypokalemia  11/21- K+ 3.4- will replete with 40 mEq x1 and recheck in AM  11/22- K+ up to 3.9  11/25- K+ 3.5  11/26- K+ 3.7  11/27- labs in AM 18. Thrombocytopenia 11/22- plts up to 160k from low of 86k- but will not restart Lovenox since Hb so low.   11/25- Back up to 258k 19. Constipation: on colace 100mg  daily  11/22- LBM 2 days ago- if no BM by tomorrow, will need intervention  -09/25/23 BMs yesterday and today; monitor  11/25- LBM yesterday  11/27- LBM yesterday 20 Hypocalcemia  11/25- will add Tums- 400 units BID with meals and recheck Thursday  11/26- Ca 8.2 this AM- improving  11/27- will check labs in AM  I spent a total of  36  minutes on total care today- >50% coordination of care- due to  D/w team about SBE and timing- also d/w nursing about overnight and overall function.    LOS: 7 days A FACE TO FACE EVALUATION WAS PERFORMED  [place] Gipe 09/28/2023, 8:22 AM

## 2023-09-28 NOTE — Anesthesia Preprocedure Evaluation (Addendum)
Anesthesia Evaluation  Patient identified by MRN, date of birth, ID band Patient awake    Reviewed: Allergy & Precautions, NPO status , Patient's Chart, lab work & pertinent test results  Airway Mallampati: I  TM Distance: >3 FB Neck ROM: Full    Dental  (+) Teeth Intact, Dental Advisory Given   Pulmonary former smoker   Pulmonary exam normal breath sounds clear to auscultation       Cardiovascular hypertension (151/68 preop), Pt. on medications + CAD, + Cardiac Stents (2004) and + Peripheral Vascular Disease  Normal cardiovascular exam Rhythm:Regular Rate:Normal  EVAR 09/2023   Neuro/Psych  PSYCHIATRIC DISORDERS     Dementia negative neurological ROS     GI/Hepatic Neg liver ROS, hiatal hernia,GERD  Medicated and Controlled,,ABLA 2/2 GIB- s/p 2 units prbc   Endo/Other  diabetes, Well Controlled, Type 2    Renal/GU negative Renal ROS  negative genitourinary   Musculoskeletal negative musculoskeletal ROS (+)    Abdominal   Peds  Hematology  (+) Blood dyscrasia, anemia Hb 10.1   Anesthesia Other Findings   Reproductive/Obstetrics negative OB ROS                             Anesthesia Physical Anesthesia Plan  ASA: 3  Anesthesia Plan: MAC   Post-op Pain Management:    Induction:   PONV Risk Score and Plan: 2 and Propofol infusion and TIVA  Airway Management Planned: Natural Airway and Simple Face Mask  Additional Equipment: None  Intra-op Plan:   Post-operative Plan:   Informed Consent: I have reviewed the patients History and Physical, chart, labs and discussed the procedure including the risks, benefits and alternatives for the proposed anesthesia with the patient or authorized representative who has indicated his/her understanding and acceptance.       Plan Discussed with: CRNA  Anesthesia Plan Comments:         Anesthesia Quick Evaluation

## 2023-09-28 NOTE — Progress Notes (Signed)
Physical Therapy Session Note  Patient Details  Name: Raylee Madani MRN: [id number] Date of Birth: [birth year]  Today's Date: 09/28/2023 PT Individual Time: 0905-1015 PT Individual Time Calculation (min): 70 min   Short Term Goals: Week 1:  PT Short Term Goal 1 (Week 1): STG = LTG due to ELOS  Skilled Therapeutic Interventions/Progress Updates:      Therapy Documentation Precautions:  Precautions Precautions: Fall Precaution Comments: Parkinsons Restrictions Weight Bearing Restrictions: No    PT session with emphasis on dynamic sitting balance and NMR training with exercises and ADL's. Pt without reports of pain and requires supervision with upper/lower body dressing and dependent for donning of shoes.   Pt requires supervision for bed mobility and transfers with RW and close supervision to CGA for gait throughout session.   Pt supervision for toileting and continent of bladder, documented in flowsheet. Pt supervision for hand hygiene and with ambulation to ortho gym ~75 ft.   Pt educated on various punches (jab, cross, hook, and upper cut) and performed multiple combinations of punches with verbal and visual cues to address coordination and dynamic sitting balance deficits.   Pt transitioned to UE and trunk stabilization exercise and performed 3 x 12 chest press and OH press with 5# dumb bell.   Pt returned to room by ambulation and left semi-reclined in bed with all needs in reach and alarm on.      Therapy/Group: Individual Therapy  Truitt Leep Truitt Leep PT, DPT  09/28/2023, 7:49 AM

## 2023-09-28 NOTE — Anesthesia Postprocedure Evaluation (Signed)
Anesthesia Post Note  Patient: Walter [name]  Procedure(s) Performed: ENTEROSCOPY HOT HEMOSTASIS (ARGON PLASMA COAGULATION     Patient location during evaluation: PACU Anesthesia Type: MAC Level of consciousness: awake and alert Pain management: pain level controlled Vital Signs Assessment: post-procedure vital signs reviewed and stable Respiratory status: spontaneous breathing, nonlabored ventilation and respiratory function stable Cardiovascular status: blood pressure returned to baseline and stable Postop Assessment: no apparent nausea or vomiting Anesthetic complications: no   No notable events documented.  Last Vitals:  Vitals:   09/28/23 1250 09/28/23 1300  BP: (!) 125/56 (!) 131/58  Pulse: 70 66  Resp: 14 15  Temp:    SpO2: 95% 96%    Last Pain:  Vitals:   09/28/23 1300  TempSrc:   PainSc: 0-No pain                 Lannie Fields

## 2023-09-28 NOTE — Interval H&P Note (Signed)
History and Physical Interval Note:  09/28/2023 11:26 AM  Sunday Corn  has presented today for surgery, with the diagnosis of Anemia, dark stools.  The various methods of treatment have been discussed with the patient and family. After consideration of risks, benefits and other options for treatment, the patient has consented to  Procedure(s): ENTEROSCOPY (N/A) as a surgical intervention.  The patient's history has been reviewed, patient examined, no change in status, stable for surgery.  I have reviewed the patient's chart and labs.  Questions were answered to the patient's satisfaction.     Imogene Burn

## 2023-09-28 NOTE — Op Note (Signed)
[facility] Patient Name: Walter [name] Procedure Date : 09/28/2023 MRN: [id number] Attending MD: Particia Lather , , [id number] Date of Birth: April 03, 1946 CSN: [id number] Age: 77 Admit Type: Inpatient Procedure:                Small bowel enteroscopy Indications:              Anemia Providers:                Madelyn Brunner" Jenne Campus RN, RN, Salley Scarlet, Technician, Stephanie Coup, CRNA Referring MD:             Hospitalist team Medicines:                Monitored Anesthesia Care Complications:            No immediate complications. Estimated Blood Loss:     Estimated blood loss was minimal. Procedure:                Pre-Anesthesia Assessment:                           - Prior to the procedure, a History and Physical                            was performed, and patient medications and                            allergies were reviewed. The patient's tolerance of                            previous anesthesia was also reviewed. The risks                            and benefits of the procedure and the sedation                            options and risks were discussed with the patient.                            All questions were answered, and informed consent                            was obtained. Prior Anticoagulants: The patient has                            taken no anticoagulant or antiplatelet agents. ASA                            Grade Assessment: III - A patient with severe                            systemic disease. After reviewing the risks and  benefits, the patient was deemed in satisfactory                            condition to undergo the procedure.                           After obtaining informed consent, the endoscope was                            passed under direct vision. Throughout the                            procedure, the patient's blood pressure, pulse, and                             oxygen saturations were monitored continuously. The                            PCF-190TL ([id number]) Olympus colonoscope was                            introduced through the mouth and advanced to the                            proximal jejunum. The small bowel enteroscopy was                            accomplished without difficulty. The patient                            tolerated the procedure well. Findings:      The examined esophagus was normal.      A 3 cm hiatal hernia was present.      Seven angioectasias with no bleeding were found in the duodenal bulb, in       the second portion of the duodenum, in the third portion of the duodenum       and in the fourth portion of the duodenum. Coagulation for bleeding       prevention using argon plasma at 2 liters/minute and 20 watts was       successful.      Two diverticula were found in the second portion of the duodenum.      Two angioectasias with no bleeding were found in the proximal jejunum.       Coagulation for bleeding prevention using argon plasma at 2       liters/minute and 20 watts was successful. Impression:               - Normal esophagus.                           - 3 cm hiatal hernia.                           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  Treated with argon plasma coagulation (APC).                           - Two duodenal diverticula.                           - Two non-bleeding angioectasias in the jejunum.                            Treated with argon plasma coagulation (APC).                           - No specimens collected. Recommendation:           - Return patient to hospital ward for ongoing care.                           - It is possible that the patient's small bowel                            angioectasias could have contributed to his anemia.                           - However, I do think that his anemia is                             multifactorial with contribution from a hematoma                            from his recent surgery and anemia of chronic                            disease.                           - The findings and recommendations were discussed                            with the patient. Procedure Code(s):        --- Professional ---                           [phone], Small intestinal endoscopy, enteroscopy                            beyond second portion of duodenum, not including                            ileum; with control of bleeding (eg, injection,                            bipolar cautery, unipolar cautery, laser, heater                            probe, stapler, plasma coagulator) Diagnosis Code(s):        ---  Professional ---                           K44.9, Diaphragmatic hernia without obstruction or                            gangrene                           K31.819, Angiodysplasia of stomach and duodenum                            without bleeding                           K26.9, Duodenal ulcer, unspecified as acute or                            chronic, without hemorrhage or perforation                           K55.20, Angiodysplasia of colon without hemorrhage                           D64.9, Anemia, unspecified CPT copyright 2022 [employer]. All rights reserved. The codes documented in this report are preliminary and upon coder review may  be revised to meet current compliance requirements. Dr Particia Lather "Alan Ripper" Leonides Schanz,  09/28/2023 12:31:17 PM Number of Addenda: 0

## 2023-09-28 NOTE — Progress Notes (Signed)
Physical Therapy Session Note  Patient Details  Name: Walter [name] MRN: [id number] Date of Birth: May 29, 1946  Today's Date: 09/28/2023 PT Missed Time: 75 Minutes Missed Time Reason: Unavailable (Comment) (off unit for procedure)  Short Term Goals: Week 1:  PT Short Term Goal 1 (Week 1): STG = LTG due to ELOS  Skilled Therapeutic Interventions/Progress Updates:      Therapy Documentation Precautions:  Precautions Precautions: Fall Precaution Comments: Parkinsons Restrictions Weight Bearing Restrictions: No General: PT Amount of Missed Time (min): 75 Minutes PT Missed Treatment Reason: Unavailable (Comment) (off unit for procedure)  Pt missed 75 minutes of skilled PT as off unit for procedure, plan to make up missed minutes as able.     Therapy/Group: Individual Therapy  Truitt Leep Truitt Leep PT, DPT  09/28/2023, 2:09 PM

## 2023-09-28 NOTE — Patient Care Conference (Signed)
Inpatient RehabilitationTeam Conference and Plan of Care Update Date: 09/27/2023   Time: 11:30 AM    Patient Name: Walter [name]      Medical Record Number: [id number]  Date of Birth: 09-24-1946 Sex: Male         Room/Bed: 4M03C/4M03C-01 Payor Info: Payor: [employer] MEDICARE / Plan: [employer] MEDICARE HMO/PPO / Product Type: *No Product type* /    Admit Date/Time:  09/21/2023  6:19 PM  Primary Diagnosis:  Debility  Hospital Problems: Principal Problem:   Debility Active Problems:   S/P AAA repair   Anemia   AVM (arteriovenous malformation) of small bowel, acquired    Expected Discharge Date: Expected Discharge Date: 10/04/23  Team Members Present: Physician leading conference: Dr. Genice Rouge Social Worker Present: Dossie Der, LCSW Nurse Present: Vedia Pereyra, RN PT Present: Truitt Leep, PT OT Present: Lou Cal, OT PPS Coordinator present : Fae Pippin, SLP     Current Status/Progress Goal Weekly Team Focus  Bowel/Bladder   This patient is continent of bladderlbowel, LBM   Maintain continence   ssess toileting needs QS and PRN    Swallow/Nutrition/ Hydration           ADL's     Bed mobility: SBA with VC  Grooming: SBA seated in W/C  Oral hygiene: SBA seated in W/C   UB dressing: SBA with increased time  LB dressing: CGA for stability, use of reacher Footwear: SBA seated in W/C, use of sock aide Shower transfer: CGA with use of RW with VC for increased stride length Bathing: Mod A bathing overall, assistance required for LB bathing seated on TTB    SBA overall, CGA fort ub/shower transfers   LB ADL retraining, posture during ADLs/functional mobility, balance   Mobility   supervision bed, min- supervison transfers, CGA gait >200 ft and steps x 12 (6 inches)   supervision  NMR and balance training    Communication                Safety/Cognition/ Behavioral Observations               Pain   Patient rates his pai 7-9/10 on pain  scale   Goal /10   Medicate patient prior to Therapy and PRN    Skin   s/p AAA with incisions to bilateral groin site,erythema /redess bilateral arms and scrotum   Maintan and treat skin integrity  Assess skin QS and orn for current and on goin issues,      Discharge Planning:  Home with wife who has assisted with his care prior to admission. Wife has other family members she provdes care to also. Has needed equipment from previous admissions and will set up follow up   Team Discussion: Debility. Continent. Pain managed with PRN and scheduled medications. Incision to groin without drainage. Incision to right and left abdomen attached edges, no drainage. Healing skin tear to left arm. Hbg trending downward. Hypocalcemia. Elevated blood pressures. Blood sugars good. Potassium improving. Tolerating D3 diet.  AC/HS.  Working on balance and AE use to complete ADLs.  Patient on target to meet rehab goals: yes, progressing towards goals with discharge date of 10/04/23  *See Care Plan and progress notes for long and short-term goals.   Revisions to Treatment Plan:  2 units of PRBC administered.  SBE scheduled tomorrow by GI. TUMS added. Losartan added back. Monitor labs and VS Teaching Needs: Medications, safety, diet/lifestyle modifications, gait/transfer training, skin care, etc.   Current Barriers to  Discharge: Decreased caregiver support, Home enviroment access/layout, and Wound care  Possible Resolutions to Barriers: Family education Ability to manage entry way to home Independent with wound care Order recommended DME     Medical Summary Current Status: needs SBE tomorrow by GI- having bleeding- blood loss- not sure location/cause- skin tear LUE- wearing SCDs- since cannot do Lovenox for now Hb up to 9.3 from 7.1 after transfusion 2 units. continent  Barriers to Discharge: Complicated Wound;Electrolyte abnormality;Medical stability;Symptomatic Anemia;Self-care education  Barriers  to Discharge Comments: barrier is endurance, poor balance- not much help at home; Possible Resolutions to Barriers/Weekly Focus: SBE tomorrow by GI- since contiuned Hb;'     doesn't need PT equipment- needs BSC/tub transfer bench- needs family ed; d/c 12/3   Continued Need for Acute Rehabilitation Level of Care: The patient requires daily medical management by a physician with specialized training in physical medicine and rehabilitation for the following reasons: Direction of a multidisciplinary physical rehabilitation program to maximize functional independence : Yes Medical management of patient stability for increased activity during participation in an intensive rehabilitation regime.: Yes Analysis of laboratory values and/or radiology reports with any subsequent need for medication adjustment and/or medical intervention. : Yes   I attest that I was present, lead the team conference, and concur with the assessment and plan of the team.   Jearld Adjutant 09/28/2023, 9:10 AM

## 2023-09-28 NOTE — Transfer of Care (Signed)
Immediate Anesthesia Transfer of Care Note  Patient: Walter [name]  Procedure(s) Performed: ENTEROSCOPY HOT HEMOSTASIS (ARGON PLASMA COAGULATION  Patient Location: PACU and Endoscopy Unit  Anesthesia Type:MAC  Level of Consciousness: drowsy  Airway & Oxygen Therapy: Patient Spontanous Breathing and Patient connected to face mask  Post-op Assessment: Report given to RN and Post -op Vital signs reviewed and stable  Post vital signs: Reviewed and stable  Last Vitals:  Vitals Value Taken Time  BP    Temp    Pulse    Resp    SpO2      Last Pain:  Vitals:   09/28/23 1047  TempSrc: Temporal  PainSc: 0-No pain      Patients Stated Pain Goal: 0 (09/26/23 2127)  Complications: No notable events documented.

## 2023-09-29 DIAGNOSIS — R5381 Other malaise: Secondary | ICD-10-CM | POA: Diagnosis not present

## 2023-09-29 LAB — CBC WITH DIFFERENTIAL/PLATELET
Abs Immature Granulocytes: 0.16 10*3/uL — ABNORMAL HIGH (ref 0.00–0.07)
Basophils Absolute: 0 10*3/uL (ref 0.0–0.1)
Basophils Relative: 0 %
Eosinophils Absolute: 0.2 10*3/uL (ref 0.0–0.5)
Eosinophils Relative: 2 %
HCT: 29.4 % — ABNORMAL LOW (ref 39.0–52.0)
Hemoglobin: 9.4 g/dL — ABNORMAL LOW (ref 13.0–17.0)
Immature Granulocytes: 2 %
Lymphocytes Relative: 8 %
Lymphs Abs: 0.8 10*3/uL (ref 0.7–4.0)
MCH: 29.1 pg (ref 26.0–34.0)
MCHC: 32 g/dL (ref 30.0–36.0)
MCV: 91 fL (ref 80.0–100.0)
Monocytes Absolute: 0.6 10*3/uL (ref 0.1–1.0)
Monocytes Relative: 6 %
Neutro Abs: 8.5 10*3/uL — ABNORMAL HIGH (ref 1.7–7.7)
Neutrophils Relative %: 82 %
Platelets: 285 10*3/uL (ref 150–400)
RBC: 3.23 MIL/uL — ABNORMAL LOW (ref 4.22–5.81)
RDW: 15.8 % — ABNORMAL HIGH (ref 11.5–15.5)
WBC: 10.3 10*3/uL (ref 4.0–10.5)
nRBC: 0 % (ref 0.0–0.2)

## 2023-09-29 LAB — GLUCOSE, CAPILLARY
Glucose-Capillary: 129 mg/dL — ABNORMAL HIGH (ref 70–99)
Glucose-Capillary: 99 mg/dL (ref 70–99)
Glucose-Capillary: 116 mg/dL — ABNORMAL HIGH (ref 70–99)
Glucose-Capillary: 122 mg/dL — ABNORMAL HIGH (ref 70–99)

## 2023-09-29 LAB — BASIC METABOLIC PANEL
Anion gap: 4 — ABNORMAL LOW (ref 5–15)
BUN: 22 mg/dL (ref 8–23)
CO2: 25 mmol/L (ref 22–32)
Calcium: 8.1 mg/dL — ABNORMAL LOW (ref 8.9–10.3)
Chloride: 109 mmol/L (ref 98–111)
Creatinine, Ser: 1.15 mg/dL (ref 0.61–1.24)
GFR, Estimated: >60 mL/min (ref >60)
Glucose, Bld: 131 mg/dL — ABNORMAL HIGH (ref 70–99)
Potassium: 3.9 mmol/L (ref 3.5–5.1)
Sodium: 138 mmol/L (ref 135–145)

## 2023-09-29 MED ORDER — CEPHALEXIN 250 MG PO CAPS
500.0000 mg | ORAL_CAPSULE | Freq: Three times a day (TID) | ORAL | Status: AC
  Administered 2023-09-29 – 2023-10-03 (×15): 500 mg via ORAL
  Filled 2023-09-29 (×15): qty 2

## 2023-09-29 MED ORDER — LOSARTAN POTASSIUM 25 MG PO TABS
25.0000 mg | ORAL_TABLET | Freq: Every day | ORAL | Status: DC
  Administered 2023-09-30 – 2023-10-03 (×4): 25 mg via ORAL
  Filled 2023-09-29 (×4): qty 1

## 2023-09-29 NOTE — Plan of Care (Signed)
  Problem: Consults Goal: RH GENERAL PATIENT EDUCATION Description: See Patient Education module for education specifics. Outcome: Progressing   Problem: RH BOWEL ELIMINATION Goal: RH STG MANAGE BOWEL WITH ASSISTANCE Description: STG Manage Bowel with min Assistance. Outcome: Progressing Goal: RH STG MANAGE BOWEL W/MEDICATION W/ASSISTANCE Description: STG Manage Bowel with Medication with min Assistance. Outcome: Progressing   Problem: RH BLADDER ELIMINATION Goal: RH STG MANAGE BLADDER WITH ASSISTANCE Description: STG Manage Bladder With min Assistance Outcome: Progressing   Problem: RH SKIN INTEGRITY Goal: RH STG SKIN FREE OF INFECTION/BREAKDOWN Description: Incisions will continue to heal and skin tears will improve without additional breakdown with min assist Outcome: Progressing Goal: RH STG MAINTAIN SKIN INTEGRITY WITH ASSISTANCE Description: STG Maintain Skin Integrity With min Assistance. Outcome: Progressing Goal: RH STG ABLE TO PERFORM INCISION/WOUND CARE W/ASSISTANCE Description: STG Able To Perform Incision/Wound Care With min Assistance. Outcome: Progressing   Problem: RH SAFETY Goal: RH STG ADHERE TO SAFETY PRECAUTIONS W/ASSISTANCE/DEVICE Description: STG Adhere to Safety Precautions With min Assistance/Device. Outcome: Progressing Goal: RH STG DECREASED RISK OF FALL WITH ASSISTANCE Description: STG Decreased Risk of Fall With min Assistance. Outcome: Progressing   Problem: RH PAIN MANAGEMENT Goal: RH STG PAIN MANAGED AT OR BELOW PT'S PAIN GOAL Description: Less than 4 with PRN medications min assist  Outcome: Progressing   Problem: RH KNOWLEDGE DEFICIT GENERAL Goal: RH STG INCREASE KNOWLEDGE OF SELF CARE AFTER HOSPITALIZATION Description: Patient will be able to manage medications, self care and diet/lifestyle modifications to improve overall health from nursing education and nursing handouts independently  Outcome: Progressing

## 2023-09-29 NOTE — Progress Notes (Signed)
PROGRESS NOTE   Subjective/Complaints:  LBM yesterday-  Eating well- slept well Denies pain  Per nurse, has angry incision in L groin  ROS:   Pt denies SOB, abd pain, CP, N/V/C/D, and vision changes   Except for HPI  Objective:   CT ABDOMEN PELVIS W CONTRAST  Result Date: 09/27/2023 CLINICAL DATA:  Worsening anemia, heme-positive stool, concern for aorto enteric fistula EXAM: CT ABDOMEN AND PELVIS WITH CONTRAST TECHNIQUE: Multidetector CT imaging of the abdomen and pelvis was performed using the standard protocol following bolus administration of intravenous contrast. RADIATION DOSE REDUCTION: This exam was performed according to the departmental dose-optimization program which includes automated exposure control, adjustment of the mA and/or kV according to patient size and/or use of iterative reconstruction technique. CONTRAST:  60mL OMNIPAQUE IOHEXOL 350 MG/ML SOLN COMPARISON:  08/18/2023 FINDINGS: Lower chest: No acute pleural or parenchymal lung disease. Hepatobiliary: No focal liver abnormality is seen. No gallstones, gallbladder wall thickening, or biliary dilatation. Pancreas: Unremarkable. No pancreatic ductal dilatation or surrounding inflammatory changes. Spleen: Normal in size without focal abnormality. Adrenals/Urinary Tract: Stable right renal cyst is not require imaging follow-up. Otherwise the kidneys enhance normally. No urinary tract calculi or obstruction. The adrenals and bladder are unremarkable. Stomach/Bowel: No bowel obstruction or ileus. There is extensive diverticulosis of the colon without evidence of acute diverticulitis. Normal appendix right lower quadrant. I do not see any contrast extravasation within the bowel lumen to suggest aortoenteric fistula or active gastrointestinal bleeding. Persistent small hiatal hernia with mural thickening at the gastroesophageal junction. Vascular/Lymphatic: Interval  endoluminal stent graft traversing the known infrarenal abdominal aortic aneurysm. Outer dimension of the aneurysm sac measures 5.9 cm, not appreciably changed since prior study. The aortic stent graft extends into the left common iliac artery. Stent lumen and the left common iliac artery appear widely patent. Extensive atherosclerosis of the left internal iliac artery, which does still appear patent. Embolic coils are seen within the right external iliac artery just beyond the bifurcation. There is occlusion of the right common iliac and right external iliac arteries. Interval femoral-femoral bypass graft appears widely patent. Minimal retrograde flow of contrast is seen into the right common femoral artery proximal to the anastomosis. Visualized proximal outflow branches of the common femoral and superficial femoral arteries are patent bilaterally, with continued multifocal stenosis within the left common femoral artery and left superficial femoral artery. Simple appearing fluid is seen surrounding the bilateral common femoral arteries and the femoral-femoral bypass graft, consistent with resolving postoperative seroma or hematoma. Punctate focus of gas within the fluid collection surrounding the bypass graft may be residual postsurgical gas. Sterility of fluid cannot be assessed by CT. Stable IVC filter. Reproductive: Prostate is unremarkable. Other: No free intraperitoneal fluid or free intraperitoneal gas. No abdominal wall hernia. Musculoskeletal: No acute or destructive bony abnormalities. Chronic L1 compression fracture with prior vertebral augmentation. Reconstructed images demonstrate no additional findings. IMPRESSION: 1. Postoperative changes from interval endoluminal stent graft repair, with stent graft extending from the aortic lumen into the left common iliac artery. Embolization and occlusion of the right external iliac and common iliac arteries, with patent femoral-femoral bypass graft as above. 2.  Resolving postoperative seroma  or hematoma surrounding the femoral-femoral bypass graft and bilateral common femoral arteries as above. Punctate focus of gas within the fluid may reflect residual postoperative gas. Sterility of fluid cannot be assessed by CT. 3. No evidence of contrast extravasation within the bowel lumen to suggest aortoenteric fistula or active gastrointestinal bleeding. If gastrointestinal bleeding remains a concern, nuclear medicine tagged red blood cell scan could be considered. 4. Diffuse colonic diverticulosis without evidence of acute diverticulitis. 5. Persistent mural thickening at the gastroesophageal junction, with evidence of small hiatal hernia. Differential diagnosis remains neoplasm versus sequela of esophagitis/gastritis. Endoscopy recommended if not recently performed. 6. Stable IVC filter. Electronically Signed   By: Sharlet Salina M.D.   On: 09/27/2023 09:36   Recent Labs    09/28/23 0817 09/29/23 0408  WBC 9.0 10.3  HGB 10.1* 9.4*  HCT 31.9* 29.4*  PLT 301 285   Recent Labs    09/27/23 0517 09/29/23 0408  NA 139 138  K 3.7 3.9  CL 108 109  CO2 24 25  GLUCOSE 111* 131*  BUN 16 22  CREATININE 0.97 1.15  CALCIUM 8.2* 8.1*    Intake/Output Summary (Last 24 hours) at 09/29/2023 0858 Last data filed at 09/29/2023 0743 Gross per 24 hour  Intake 1440 ml  Output 675 ml  Net 765 ml        Physical Exam: Vital Signs Blood pressure (!) 151/66, pulse 71, temperature 98.1 F (36.7 C), resp. rate 16, height 5\' 7"  (1.702 m), weight 64 kg, SpO2 96%.       General: awake, alert, appropriate, sitting up in bed- 100% of tray; NAD HENT: conjugate gaze; oropharynx moist CV: regular rate and rhythm; no JVD Pulmonary: CTA B/L; no W/R/R- good air movement GI: soft, NT, ND, (+)BS Psychiatric: appropriate Neurological:alert   Skin; Abdominal incision glued- looks good- no drainage- only localized erythema around glue-looks great- healing great L groin  incision angry/reddened- R gorin looks great   Assessment/Plan: 1. Functional deficits which require 3+ hours per day of interdisciplinary therapy in a comprehensive inpatient rehab setting. Physiatrist is providing close team supervision and 24 hour management of active medical problems listed below. Physiatrist and rehab team continue to assess barriers to discharge/monitor patient progress toward functional and medical goals  Care Tool:  Bathing    Body parts bathed by patient: Right arm, Left arm, Chest, Abdomen, Front perineal area, Right upper leg, Left upper leg, Face   Body parts bathed by helper: Left lower leg, Right lower leg, Buttocks     Bathing assist Assist Level: Minimal Assistance - Patient > 75%     Upper Body Dressing/Undressing Upper body dressing   What is the patient wearing?: Pull over shirt    Upper body assist Assist Level: Contact Guard/Touching assist    Lower Body Dressing/Undressing Lower body dressing      What is the patient wearing?: Pants, Incontinence brief     Lower body assist Assist for lower body dressing: Maximal Assistance - Patient 25 - 49%     Toileting Toileting Toileting Activity did not occur (Clothing management and hygiene only): N/A (no void or bm)  Toileting assist Assist for toileting: Maximal Assistance - Patient 25 - 49%     Transfers Chair/bed transfer  Transfers assist     Chair/bed transfer assist level: Contact Guard/Touching assist     Locomotion Ambulation   Ambulation assist      Assist level: Minimal Assistance - Patient > 75% Assistive device: Walker-rolling Max distance:  120   Walk 10 feet activity   Assist     Assist level: Contact Guard/Touching assist Assistive device: Walker-rolling   Walk 50 feet activity   Assist    Assist level: Minimal Assistance - Patient > 75% Assistive device: Walker-rolling    Walk 150 feet activity   Assist    Assist level: Minimal  Assistance - Patient > 75% Assistive device: Walker-rolling    Walk 10 feet on uneven surface  activity   Assist     Assist level: Minimal Assistance - Patient > 75% Assistive device: Walker-rolling   Wheelchair     Assist Is the patient using a wheelchair?: No             Wheelchair 50 feet with 2 turns activity    Assist            Wheelchair 150 feet activity     Assist          Blood pressure (!) 151/66, pulse 71, temperature 98.1 F (36.7 C), resp. rate 16, height 5\' 7"  (1.702 m), weight 64 kg, SpO2 96%.  Medical Problem List and Plan: Debility following AAA repair             -patient may shower             -ELOS/Goals: S 10-14 days           D/c 12/3  Con't CIR PT and OT- therapy off today  Found likely cause of bleeding and treated- per GI- they signed off.  2.  Antithrombotics: -DVT/anticoagulation:  Mechanical: Sequential compression devices, below knee Bilateral lower extremities 11/21-11/22 not on Lovenox due to drop in Hb as well as low plts              -antiplatelet therapy: Aspirin 81 mg daily   3. Pain Management: Tylenol, oxycodone, and robaxin as needed   11/22- asked nursing to bring pain meds for neck pain  11/25- denies pain this AM 4. Mood/Behavior/Sleep: LCSW to evaluate and provide emotional support             -antipsychotic agents: n/a   5. Neuropsych/cognition: This patient is not capable of making decisions on his own behalf. Due to dementia   6. Skin/Wound Care: Routine skin care checks   7. Fluids/Electrolytes/Nutrition: Routine Is and Os and follow-up chemistries             -dysphagia 3 diet with thin liquids/carb modified   8: Hypertension: monitor TID and prn             -home losartan 100 mg daily and amlodipine 5 mg daily held  11/22- BP somewhat elevated for last 24 hours- will restart home Norvasc 5 mg daily and monitor 11-23: Got a single dose of amlodipine 10 mg exam; reduce back down to 5,  monitor 2 to 3 days before further increase -09/25/23 BP a little variable, monitor trend with med adjustment 11/25- BP running 130s to 150s systolic- if still up tomorrow- will add back Losartan- home medicine.  11/26- Add back Losartan 25 mg daily for BP running 130s-140s'  11/27- BP a little elevated this AM- in 140s-150s- but is NPO- will mointor for trend 11/28- somewhat labile- 100s-150's- will change Losartan to at bedtime starting tomorrow Vitals:   09/27/23 1935 09/28/23 0311 09/28/23 1047 09/28/23 1226  BP: (!) 152/52 (!) 151/68 122/68 (!) 100/54   09/28/23 1230 09/28/23 1238 09/28/23 1240 09/28/23 1250  BP: (!) 98/46 Marland Kitchen)  115/59 (!) 109/56 (!) 125/56   09/28/23 1300 09/28/23 1447 09/28/23 1920 09/29/23 0445  BP: (!) 131/58 131/60 (!) 149/64 (!) 151/66    9: 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 Randie Heinz.             -continue aspirin             -follow-up [facility]   10: AKI: improving    11/22- Cr down to 1.06- doing better 11: ABLA/chronic anemia: downward drift in H and H over last 48 hours, continue iron 11/21- Hb down to 7.3 from 8.8- in 2 days- will recheck in AM- check FOBT daily x 3 days, start Fe sulfate 325 mg qday.  Had colonoscopy in Spring 2024- no issues- will also call [facility] to inform them -they don't want any additional imaging at this time.  11/22- no BM to do hemoccult- also Hb up to 7.5 today- will recheck Monday and f/u on this.  -09/25/23 no FOBT done, even though he's had BMs; asked nursing to please obtain this. Labs ordered for tomorrow already.  11/25- FOBT (+)- and Hb down to 7.1- however no Increase in BUN to signal fast GI bleed- will transfuse if pt will allow- D/w PA- and she will do transfusion if pt allows. 11/26- pt s/p 2 units pRBCs- up to 9.3- per GI, would order CT of abd/pelvis with contrast- ordered this AM- to look fo raorto-enteric fistula- or  intra-abdominal source of bleeding. 11/27- SBE due today- is NPO for it- will check labs this AM and tomorrow   11/28- Hb 9.4 (just bumped yesterday- but back to where it was)- 2 angioectasias found- and well treated with argon plasma 12: Prediabetes: home metformin 1000 mg held             -continue SSI 11/21- well controlled- con't regimen- will restart Metformin 500 mg BID- since renal function doing better- wife doesn't want him receiving insulin by d/c (he's only on SSI).  11/22- BG's running 118 to 180's- will con't Metformin- home dose 1000 mg BID- but will wait to increase for a few days.   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 Hypokalemia  11/21- K+ 3.4- will replete with 40 mEq x1 and recheck in AM  11/22- K+ up to 3.9  11/25- K+ 3.5  11/26- K+ 3.7  11/27- labs in AM  11/28- K+ 3.9 18. Thrombocytopenia 11/22- plts up to 160k from low of 86k- but will not restart Lovenox since Hb so low.   11/25- Back up to 258k 19. Constipation: on colace 100mg  daily  11/22- LBM 2 days ago- if no BM by tomorrow, will need intervention  -09/25/23 BMs yesterday and today; monitor  11/25- LBM yesterday  11/27- LBM yesterday  11/28- LBM yesterday 20 Hypocalcemia  11/25- will add Tums- 400 units BID with meals and recheck Thursday  11/26- Ca 8.2 this AM- improving  11/27- will check labs in  AM  11/28- Ca 8.1 21/ Cellulitis/infected incision  11/28- will start Keflex 500 mg 8 hours for L groin incision- and monitor   I spent a total of 35     minutes on total care today- >50% coordination of care- due to  D/w nursing about L groin incision issues- - started keflex  LOS: 8 days A FACE TO FACE EVALUATION WAS PERFORMED  Walter [name] 09/29/2023, 8:58 AM

## 2023-09-30 ENCOUNTER — Encounter (HOSPITAL_COMMUNITY): Payer: Self-pay | Admitting: Internal Medicine

## 2023-09-30 DIAGNOSIS — R5381 Other malaise: Secondary | ICD-10-CM | POA: Diagnosis not present

## 2023-09-30 LAB — GLUCOSE, CAPILLARY
Glucose-Capillary: 107 mg/dL — ABNORMAL HIGH (ref 70–99)
Glucose-Capillary: 105 mg/dL — ABNORMAL HIGH (ref 70–99)
Glucose-Capillary: 180 mg/dL — ABNORMAL HIGH (ref 70–99)
Glucose-Capillary: 115 mg/dL — ABNORMAL HIGH (ref 70–99)

## 2023-09-30 NOTE — Progress Notes (Signed)
Occupational Therapy Session Note  Patient Details  Name: Walter [name] MRN: [id number] Date of Birth: February 18, 1946  Today's Date: 09/30/2023 OT Individual Time: 0900-0950 OT Individual Time Calculation (min): 50 min    Short Term Goals: Week 2:  OT Short Term Goal 1 (Week 2): STG = LTG (Due to ELOS)  Skilled Therapeutic Interventions/Progress Updates:    1:1 Pt received in the bed. Pt already brushed teeth and shaved at the sink himself earlier (NT had setup in him up at the sink earlier per his request). Pt able to get up from flat bed without bed rail with supervision. Pt ambulated to the bathroom and transitioned into the shower stall with seat it in and grab bars with contact guard. Pt required cues for turning and step length with his left LE. Pt able bathe sit to stand with the grab bar with supervision 10/10 parts. Pt able to dry off and ambulated again with contact guard to the recliner to dress. Pt able to dress with extra time from pile of clothing. When pt is ambulating with RW pt with forward lean and shuffle/ parkinsonian gait and his trunk can get ahead of his feet requires cues to take a bigger step. Pt reports his current ambulation in the same as PTA however he wasn't using a walker and one is recommended all the time now for safety. After ambulating to the gym performed some modified OTAGA exercises at the bar include come up on toes and then heels with and without UE support; kicking LE out to the side bilaterally and then sit to stands/ stand to sit without UE Support. Pt transported back to room and left in w/c in prep for next therapy.   Therapy Documentation Precautions:  Precautions Precautions: Fall Precaution Comments: Parkinsons Restrictions Weight Bearing Restrictions: No  Pain: Pain Assessment Pain Scale: 0-10 Pain Score: 0-No pain   Therapy/Group: Individual Therapy  Roney Mans [facility] 09/30/2023, 10:20 AM

## 2023-09-30 NOTE — Progress Notes (Signed)
PROGRESS NOTE   Subjective/Complaints:  LBM yesterday-  Eating well- slept well Denies pain  Per nurse, has angry incision in L groin  ROS:   Pt denies SOB, abd pain, CP, N/V/C/D, and vision changes   Except for HPI  Objective:   No results found. Recent Labs    09/28/23 0817 09/29/23 0408  WBC 9.0 10.3  HGB 10.1* 9.4*  HCT 31.9* 29.4*  PLT 301 285   Recent Labs    09/29/23 0408  NA 138  K 3.9  CL 109  CO2 25  GLUCOSE 131*  BUN 22  CREATININE 1.15  CALCIUM 8.1*    Intake/Output Summary (Last 24 hours) at 09/30/2023 0925 Last data filed at 09/30/2023 0809 Gross per 24 hour  Intake 820 ml  Output 2200 ml  Net -1380 ml        Physical Exam: Vital Signs Blood pressure (!) 156/64, pulse 74, temperature 97.7 F (36.5 C), resp. rate 17, height 5\' 7"  (1.702 m), weight 64 kg, SpO2 97%.       General: awake, alert, appropriate, sitting up in bed- 100% of tray; NAD HENT: conjugate gaze; oropharynx moist CV: regular rate and rhythm; no JVD Pulmonary: CTA B/L; no W/R/R- good air movement GI: soft, NT, ND, (+)BS Psychiatric: appropriate Neurological:alert   Skin; Abdominal incision glued- looks good- no drainage- only localized erythema around glue-looks great- healing great L groin incision angry/reddened- R gorin looks great   Assessment/Plan: 1. Functional deficits which require 3+ hours per day of interdisciplinary therapy in a comprehensive inpatient rehab setting. Physiatrist is providing close team supervision and 24 hour management of active medical problems listed below. Physiatrist and rehab team continue to assess barriers to discharge/monitor patient progress toward functional and medical goals  Care Tool:  Bathing    Body parts bathed by patient: Right arm, Left arm, Chest, Abdomen, Front perineal area, Right upper leg, Left upper leg, Face   Body parts bathed by helper: Left  lower leg, Right lower leg, Buttocks     Bathing assist Assist Level: Minimal Assistance - Patient > 75%     Upper Body Dressing/Undressing Upper body dressing   What is the patient wearing?: Pull over shirt    Upper body assist Assist Level: Contact Guard/Touching assist    Lower Body Dressing/Undressing Lower body dressing      What is the patient wearing?: Pants, Incontinence brief     Lower body assist Assist for lower body dressing: Maximal Assistance - Patient 25 - 49%     Toileting Toileting Toileting Activity did not occur (Clothing management and hygiene only): N/A (no void or bm)  Toileting assist Assist for toileting: Maximal Assistance - Patient 25 - 49%     Transfers Chair/bed transfer  Transfers assist     Chair/bed transfer assist level: Contact Guard/Touching assist     Locomotion Ambulation   Ambulation assist      Assist level: Minimal Assistance - Patient > 75% Assistive device: Walker-rolling Max distance: 120   Walk 10 feet activity   Assist     Assist level: Contact Guard/Touching assist Assistive device: Walker-rolling   Walk 50 feet activity  Assist    Assist level: Minimal Assistance - Patient > 75% Assistive device: Walker-rolling    Walk 150 feet activity   Assist    Assist level: Minimal Assistance - Patient > 75% Assistive device: Walker-rolling    Walk 10 feet on uneven surface  activity   Assist     Assist level: Minimal Assistance - Patient > 75% Assistive device: Walker-rolling   Wheelchair     Assist Is the patient using a wheelchair?: No             Wheelchair 50 feet with 2 turns activity    Assist            Wheelchair 150 feet activity     Assist          Blood pressure (!) 156/64, pulse 74, temperature 97.7 F (36.5 C), resp. rate 17, height 5\' 7"  (1.702 m), weight 64 kg, SpO2 97%.  Medical Problem List and Plan: Debility following AAA repair              -patient may shower             -ELOS/Goals: S 10-14 days           D/c 12/3  Con't CIR PT and OT- therapy off today  Found likely cause of bleeding and treated- per GI- they signed off.  2.  Antithrombotics: -DVT/anticoagulation:  Mechanical: Sequential compression devices, below knee Bilateral lower extremities 11/21-11/22 not on Lovenox due to drop in Hb as well as low plts              -antiplatelet therapy: Aspirin 81 mg daily   3. Pain Management: Tylenol, oxycodone, and robaxin as needed   11/22- asked nursing to bring pain meds for neck pain  11/25- denies pain this AM 4. Mood/Behavior/Sleep: LCSW to evaluate and provide emotional support             -antipsychotic agents: n/a   5. Neuropsych/cognition: This patient is not capable of making decisions on his own behalf. Due to dementia   6. Skin/Wound Care: Routine skin care checks   7. Fluids/Electrolytes/Nutrition: Routine Is and Os and follow-up chemistries             -dysphagia 3 diet with thin liquids/carb modified   8: Hypertension: monitor TID and prn             -home losartan 100 mg daily and amlodipine 5 mg daily held  11/22- BP somewhat elevated for last 24 hours- will restart home Norvasc 5 mg daily and monitor 11-23: Got a single dose of amlodipine 10 mg exam; reduce back down to 5, monitor 2 to 3 days before further increase -09/25/23 BP a little variable, monitor trend with med adjustment 11/25- BP running 130s to 150s systolic- if still up tomorrow- will add back Losartan- home medicine.  11/26- Add back Losartan 25 mg daily for BP running 130s-140s'  11/27- BP a little elevated this AM- in 140s-150s- but is NPO- will mointor for trend 11/28- somewhat labile- 100s-150's- will change Losartan to at bedtime starting tomorrow Vitals:   09/28/23 1226 09/28/23 1230 09/28/23 1238 09/28/23 1240  BP: (!) 100/54 (!) 98/46 (!) 115/59 (!) 109/56   09/28/23 1250 09/28/23 1300 09/28/23 1447 09/28/23 1920  BP: (!)  125/56 (!) 131/58 131/60 (!) 149/64   09/29/23 0445 09/29/23 1547 09/29/23 1926 09/30/23 0315  BP: (!) 151/66 (!) 148/77 138/65 (!) 156/64    9: AAA s/p  endovascular repair with left common iliac to left common femoral artery bypass and stent of left external iliac artery/15 by Dr. Randie Heinz.             -continue aspirin             -follow-up [facility]   10: AKI: improving    11/22- Cr down to 1.06- doing better 11: ABLA/chronic anemia: downward drift in H and H over last 48 hours, continue iron 11/21- Hb down to 7.3 from 8.8- in 2 days- will recheck in AM- check FOBT daily x 3 days, start Fe sulfate 325 mg qday.  Had colonoscopy in Spring 2024- no issues- will also call [facility] to inform them -they don't want any additional imaging at this time.  11/22- no BM to do hemoccult- also Hb up to 7.5 today- will recheck Monday and f/u on this.  -09/25/23 no FOBT done, even though he's had BMs; asked nursing to please obtain this. Labs ordered for tomorrow already.  11/25- FOBT (+)- and Hb down to 7.1- however no Increase in BUN to signal fast GI bleed- will transfuse if pt will allow- D/w PA- and she will do transfusion if pt allows. 11/26- pt s/p 2 units pRBCs- up to 9.3- per GI, would order CT of abd/pelvis with contrast- ordered this AM- to look fo raorto-enteric fistula- or intra-abdominal source of bleeding. 11/27- SBE due today- is NPO for it- will check labs this AM and tomorrow   11/28- Hb 9.4 (just bumped yesterday- but back to where it was)- 2 angioectasias found- and well treated with argon plasma 12: Prediabetes: home metformin 1000 mg held             -continue SSI 11/21- well controlled- con't regimen- will restart Metformin 500 mg BID- since renal function doing better- wife doesn't want him receiving insulin by d/c (he's only on SSI).  11/22- BG's running 118 to 180's- will con't Metformin- home dose 1000 mg BID- but will wait to increase for a few days.   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 Hypokalemia  11/21- K+ 3.4- will replete with 40 mEq x1 and recheck in AM  11/22- K+ up to 3.9  11/25- K+ 3.5  11/26- K+ 3.7  11/27- labs in AM  11/28- K+ 3.9 18. Thrombocytopenia 11/22- plts up to 160k from low of 86k- but will not restart Lovenox since Hb so low.   11/25- Back up to 258k 19. Constipation: on colace 100mg  daily  11/22- LBM 2 days ago- if no BM by tomorrow, will need intervention  11/29- LBM yesterday 20 Hypocalcemia  11/25- will add Tums- 400 units BID with meals and recheck Thursday  11/26- Ca 8.2 this AM- improving  11/27- will check labs in AM  11/28- Ca 8.1 21/ Cellulitis/infected incision  11/28- will start Keflex 500 mg 8 hours for L groin incision- and monitor  11/29- less angry/red- will have Dr Benjie Karvonen monitor over weekend. Will have   I spent a total of  35  minutes on total care today- >50% coordination of care- due to  d/w nursing about infected/irritated groin incision  LOS: 9 days A FACE TO FACE EVALUATION WAS PERFORMED  Walter [name] 09/30/2023, 9:25 AM

## 2023-09-30 NOTE — Progress Notes (Signed)
Physical Therapy Session Note  Patient Details  Name: Walter [name] MRN: [id number] Date of Birth: 23-May-1946  Today's Date: 09/30/2023 PT Individual Time: 1120-1200 PT Individual Time Calculation (min): 40 min   Short Term Goals: Week 1:  PT Short Term Goal 1 (Week 1): STG = LTG due to ELOS  Skilled Therapeutic Interventions/Progress Updates:     Pt received seated in recliner and agrees to therapy. No complaint of pain. Pt performs sit to stand with RW and cues for initiation and sequencing. Pt ambulates x150' to gym with CGA/minA, with cues primarily for performing large amplitude movements with longer stride lengths to counteract tendency for festinating gait pattern. Pt takes seated rest break. Pt completes repeated reps of sit to stand while holding onto 3lb bar with BUEs and cued to lift bar above head in standing to promote optimal posture and balancing challenge. Mirror provided for visual feedback. Pt completes x10 with CGA and cues for body mechanics and sequencing. Activity progressed by having pt complete with 5lb bar. Pt then performs high level gait training without AD to promote increased balance challenge and increased loading through BLEs. Pt ambulates x150', requiring minA and with more consistent cueing to extend knees, hips, and trunk, due to pt's tendency for forward flexed posture. Cues also provided to increase stride length and step height bilaterally. Pt then completes standing activity at parallel bar with mirror for visual feedback. Bar raised to above waist height and pt tasked with performing alternating thigh taps on bar to promote hip flexor strengthening and large amplitude movements. Pt completes x20 total prior to ambulating backward x5' to return to sitting at mat table.  Pt completes same activity with 4lb ankle weights to increase challenge and strengthening component. Pt then ambulates back to room with RW and 4lb ankle weight still in place. Pt left seated in  recliner with alarm intact and all needs within reach.   Therapy Documentation Precautions:  Precautions Precautions: Fall Precaution Comments: Parkinsons Restrictions Weight Bearing Restrictions: No    Therapy/Group: Individual Therapy  Beau Fanny, PT, DPT 09/30/2023, 3:58 PM

## 2023-09-30 NOTE — Progress Notes (Signed)
Physical Therapy Session Note  Patient Details  Name: Walter [name] MRN: [id number] Date of Birth: Apr 10, 1946  Today's Date: 09/30/2023 PT Individual Time: 1000-1058 PT Individual Time Calculation (min): 58 min   Short Term Goals: Week 1:  PT Short Term Goal 1 (Week 1): STG = LTG due to ELOS   Skilled Therapeutic Interventions/Progress Updates:    Session focused on functional mobility retraining, dynamic gait training with RW, supine strengthening and stretching for increased ROM and improved mobility, and NMR for balance retraining. Pt performs basic transfers with RW with supervision to CGA overall with occasional CGA needed for turning and tactile/verbal cues for safe positioning and placement.   Administered TUG for fall risk assessment (See results below).  Dynamic gait through obstacle course with RW including navigating turns and stepping over simulated threshold x 4 reps total with close supervision to CGA overall for balance. Cues for safe technique with stepping over threshold due to positioning of self and RW but able to demonstrate carryover.   Supine trunk rotation with hooklying position 15 reps each direction, bridging with ball between knees for adduction activation with cues for core/TA tightening x 15 reps, and overhead stretch with ball for full ROM and trunk extension stretching x 20 reps x 2 sets.  Pt performed bed mobility independent on flat mat table for supine <> sit and rolling.   Gait training with RW on unit x 200' with close supervision to CGA needed due to festinating gait, decreased foot clearance and forward lean especially with balance during turning or in tight space.   NMR for balance retraining with toe taps to 4" and 6" step and UE support with cues for targeted hip flexion especially on R with visual marker for foot placement on step. Gait training without device forwards and backwards x 10' x 2 reps and then gait in hallway x 40' with turning -  improved posture noted without RW initially but as gait distance increased, flexion increased and festinating gait present. Difficulty with turning noted.   End of session transferred to recliner to wait next therapy session with all needs in reach.   Therapy Documentation Precautions:  Precautions Precautions: Fall Precaution Comments: Parkinsons Restrictions Weight Bearing Restrictions: No   Pain: Pain Assessment Pain Scale: 0-10 Pain Score: 0-No pain  Balance: Standardized Balance Assessment Standardized Balance Assessment: Timed Up and Go Test Timed Up and Go Test TUG: Normal TUG Normal TUG (seconds): 23.6 (avg 3 trials with RW)     Therapy/Group: Individual Therapy  Karolee Stamps Darrol Poke, PT, DPT, CBIS  09/30/2023, 11:08 AM

## 2023-09-30 NOTE — Progress Notes (Signed)
Occupational Therapy Session Note  Patient Details  Name: Walter [name] MRN: [id number] Date of Birth: 1946/03/12  Today's Date: 09/30/2023 OT Individual Time: 1405-1450 OT Individual Time Calculation (min): 45 min   Short Term Goals: Week 2: LTG=STG 2/2 ELOS  Skilled Therapeutic Interventions/Progress Updates:    Pt greeted semi-reclined in bed asleep, easy to wake and agreeable to OT treatment session. Pt needed to use the bathroom and completed bed mobility with increased time and CGA, He then stood w/ RW, CGA, and ambulated into bathroom w/ RW and min A. Pt needed cues to try to take larger steps due toshirt festinating gate. Cues for RW positioning and keeping the RW with him when turning to sit onto [facility] over toilet. Pt unable to have BM but had small smear when he went to wipe. Pt ambulated to the sink with cues for RW positioning. MD then entered room to check patients groin incision, for which he stayed standing for. Pt then ambulated to therapy gym w/ RW and CGA. Pt completed 10 mins on NuStep on level 4 for generalized strength and conditioning. Cues to maintain steps per minute. Pt ambulated to therapy mat In similar fashion and worked on UB there-ex and standing balance/endurnace using 3 lb weighted bar. 3 sets of 10 chest press, bicep curl, and straight arm raise in standing. Rest breaks in between sets. Pt ambulated back to room w/ RW and CGA. Pt returned to bed and left semi-reclined in bed with bed alarm on, call bell in reach, and needs met.  Therapy Documentation Precautions:  Precautions Precautions: Fall Precaution Comments: Parkinsons Restrictions Weight Bearing Restrictions: No Pain:  Denies pain  Therapy/Group: Individual Therapy  Mal Amabile 09/30/2023, 2:13 PM

## 2023-10-01 DIAGNOSIS — R739 Hyperglycemia, unspecified: Secondary | ICD-10-CM

## 2023-10-01 DIAGNOSIS — I10 Essential (primary) hypertension: Secondary | ICD-10-CM | POA: Diagnosis not present

## 2023-10-01 DIAGNOSIS — R5381 Other malaise: Secondary | ICD-10-CM | POA: Diagnosis not present

## 2023-10-01 LAB — GLUCOSE, CAPILLARY
Glucose-Capillary: 114 mg/dL — ABNORMAL HIGH (ref 70–99)
Glucose-Capillary: 94 mg/dL (ref 70–99)
Glucose-Capillary: 134 mg/dL — ABNORMAL HIGH (ref 70–99)
Glucose-Capillary: 127 mg/dL — ABNORMAL HIGH (ref 70–99)

## 2023-10-01 NOTE — Progress Notes (Signed)
PROGRESS NOTE   Subjective/Complaints:  Pt doing well, slept well, denies pain, LBM yesterday per patient, urinating ok, denies any other complaints or concerns today.   ROS:   Pt denies SOB, abd pain, CP, N/V/C/D, and vision changes   Except for HPI  Objective:   No results found. Recent Labs    09/29/23 0408  WBC 10.3  HGB 9.4*  HCT 29.4*  PLT 285   Recent Labs    09/29/23 0408  NA 138  K 3.9  CL 109  CO2 25  GLUCOSE 131*  BUN 22  CREATININE 1.15  CALCIUM 8.1*    Intake/Output Summary (Last 24 hours) at 10/01/2023 2047 Last data filed at 10/01/2023 2006 Gross per 24 hour  Intake 743 ml  Output 1225 ml  Net -482 ml        Physical Exam: Vital Signs Blood pressure (!) 160/68, pulse 67, temperature 98 F (36.7 C), temperature source Oral, resp. rate 18, height 5\' 7"  (1.702 m), weight 64 kg, SpO2 98%.       General: awake, alert, appropriate, laying in bed watching TV; NAD HENT: conjugate gaze; oropharynx moist CV: regular rate and rhythm; no JVD Pulmonary: CTA B/L; no W/R/R- good air movement GI: soft, NT, ND, (+)BS Psychiatric: appropriate Neurological:alert  PRIOR EXAMS: Skin; Abdominal incision glued- looks good- no drainage- only localized erythema around glue-looks great- healing great L groin incision angry/reddened- R gorin looks great   Assessment/Plan: 1. Functional deficits which require 3+ hours per day of interdisciplinary therapy in a comprehensive inpatient rehab setting. Physiatrist is providing close team supervision and 24 hour management of active medical problems listed below. Physiatrist and rehab team continue to assess barriers to discharge/monitor patient progress toward functional and medical goals  Care Tool:  Bathing    Body parts bathed by patient: Right arm, Left arm, Chest, Abdomen, Front perineal area, Right upper leg, Left upper leg, Face, Buttocks,  Right lower leg, Left lower leg   Body parts bathed by helper: Left lower leg, Right lower leg, Buttocks     Bathing assist Assist Level: Supervision/Verbal cueing     Upper Body Dressing/Undressing Upper body dressing   What is the patient wearing?: Pull over shirt    Upper body assist Assist Level: Set up assist    Lower Body Dressing/Undressing Lower body dressing      What is the patient wearing?: Underwear/pull up, Pants     Lower body assist Assist for lower body dressing: Contact Guard/Touching assist     Toileting Toileting Toileting Activity did not occur (Clothing management and hygiene only): N/A (no void or bm)  Toileting assist Assist for toileting: Supervision/Verbal cueing     Transfers Chair/bed transfer  Transfers assist     Chair/bed transfer assist level: Supervision/Verbal cueing     Locomotion Ambulation   Ambulation assist      Assist level: Minimal Assistance - Patient > 75% Assistive device: Walker-rolling Max distance: 200'   Walk 10 feet activity   Assist     Assist level: Supervision/Verbal cueing Assistive device: Walker-rolling   Walk 50 feet activity   Assist    Assist level: Contact Guard/Touching assist  Assistive device: Walker-rolling    Walk 150 feet activity   Assist    Assist level: Contact Guard/Touching assist Assistive device: Walker-rolling    Walk 10 feet on uneven surface  activity   Assist     Assist level: Minimal Assistance - Patient > 75% Assistive device: Walker-rolling   Wheelchair     Assist Is the patient using a wheelchair?: No             Wheelchair 50 feet with 2 turns activity    Assist            Wheelchair 150 feet activity     Assist          Blood pressure (!) 160/68, pulse 67, temperature 98 F (36.7 C), temperature source Oral, resp. rate 18, height 5\' 7"  (1.702 m), weight 64 kg, SpO2 98%.  Medical Problem List and Plan: Debility  following AAA repair             -patient may shower             -ELOS/Goals: S 10-14 days           D/c 12/3  Con't CIR PT and OT- therapy off today  Found likely cause of bleeding and treated- per GI- they signed off.  2.  Antithrombotics: -DVT/anticoagulation:  Mechanical: Sequential compression devices, below knee Bilateral lower extremities 11/21-11/22 not on Lovenox due to drop in Hb as well as low plts              -antiplatelet therapy: Aspirin 81 mg daily   3. Pain Management: Tylenol, oxycodone, and robaxin as needed   11/22- asked nursing to bring pain meds for neck pain  11/25- denies pain this AM 4. Mood/Behavior/Sleep: LCSW to evaluate and provide emotional support             -antipsychotic agents: n/a   5. Neuropsych/cognition: This patient is not capable of making decisions on his own behalf. Due to dementia   6. Skin/Wound Care: Routine skin care checks   7. Fluids/Electrolytes/Nutrition: Routine Is and Os and follow-up chemistries             -dysphagia 3 diet with thin liquids/carb modified   8: Hypertension: monitor TID and prn             -home losartan 100 mg daily and amlodipine 5 mg daily held  11/22- BP somewhat elevated for last 24 hours- will restart home Norvasc 5 mg daily and monitor 11-23: Got a single dose of amlodipine 10 mg exam; reduce back down to 5, monitor 2 to 3 days before further increase -09/25/23 BP a little variable, monitor trend with med adjustment 11/25- BP running 130s to 150s systolic- if still up tomorrow- will add back Losartan- home medicine.  11/26- Add back Losartan 25 mg daily for BP running 130s-140s'  11/27- BP a little elevated this AM- in 140s-150s- but is NPO- will mointor for trend 11/28- somewhat labile- 100s-150's- will change Losartan to at bedtime starting tomorrow -10/01/23 BP labile still, but just changed meds; monitor trend Vitals:   09/28/23 1300 09/28/23 1447 09/28/23 1920 09/29/23 0445  BP: (!) 131/58 131/60  (!) 149/64 (!) 151/66   09/29/23 1547 09/29/23 1926 09/30/23 0315 09/30/23 1259  BP: (!) 148/77 138/65 (!) 156/64 128/65   09/30/23 1933 10/01/23 0400 10/01/23 1433 10/01/23 2006  BP: 136/66 136/70 (!) 141/70 (!) 160/68    9: 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 Randie Heinz.             -continue aspirin             -follow-up [facility]   10: AKI: improving    11/22- Cr down to 1.06- doing better 11: ABLA/chronic anemia: downward drift in H and H over last 48 hours, continue iron 11/21- Hb down to 7.3 from 8.8- in 2 days- will recheck in AM- check FOBT daily x 3 days, start Fe sulfate 325 mg qday.  Had colonoscopy in Spring 2024- no issues- will also call [facility] to inform them -they don't want any additional imaging at this time.  11/22- no BM to do hemoccult- also Hb up to 7.5 today- will recheck Monday and f/u on this.  -09/25/23 no FOBT done, even though he's had BMs; asked nursing to please obtain this. Labs ordered for tomorrow already.  11/25- FOBT (+)- and Hb down to 7.1- however no Increase in BUN to signal fast GI bleed- will transfuse if pt will allow- D/w PA- and she will do transfusion if pt allows. 11/26- pt s/p 2 units pRBCs- up to 9.3- per GI, would order CT of abd/pelvis with contrast- ordered this AM- to look fo raorto-enteric fistula- or intra-abdominal source of bleeding. 11/27- SBE due today- is NPO for it- will check labs this AM and tomorrow   11/28- Hb 9.4 (just bumped yesterday- but back to where it was)- 2 angioectasias found- and well treated with argon plasma 12: Prediabetes: home metformin 1000 mg held             -continue SSI 11/21- well controlled- con't regimen- will restart Metformin 500 mg BID- since renal function doing better- wife doesn't want him receiving insulin by d/c (he's only on SSI).  11/22- BG's running 118 to 180's- will con't Metformin- home dose 1000 mg  BID- but will wait to increase for a few days.   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 Hypokalemia  11/21- K+ 3.4- will replete with 40 mEq x1 and recheck in AM  11/22- K+ up to 3.9  11/25- K+ 3.5  11/26- K+ 3.7  11/27- labs in AM  11/28- K+ 3.9 18. Thrombocytopenia 11/22- plts up to 160k from low of 86k- but will not restart Lovenox since Hb so low.   11/25- Back up to 258k 19. Constipation: on colace 100mg  daily  11/22- LBM 2 days ago- if no BM by tomorrow, will need intervention  -09/25/23 BMs yesterday and today; monitor  -10/01/23 having fairly regular BMs, cont regimen 20 Hypocalcemia  11/25- will add Tums- 400 units BID with meals and recheck Thursday  11/26- Ca 8.2 this AM- improving  11/27- will check labs in AM  11/28- Ca 8.1 21/ Cellulitis/infected incision  11/28- will start Keflex 500 mg 8 hours for L groin incision- and monitor    LOS: 10 days A FACE TO FACE EVALUATION WAS PERFORMED  [address]  10/01/2023, 8:47 PM

## 2023-10-02 DIAGNOSIS — R5381 Other malaise: Secondary | ICD-10-CM | POA: Diagnosis not present

## 2023-10-02 DIAGNOSIS — R739 Hyperglycemia, unspecified: Secondary | ICD-10-CM | POA: Diagnosis not present

## 2023-10-02 DIAGNOSIS — I10 Essential (primary) hypertension: Secondary | ICD-10-CM | POA: Diagnosis not present

## 2023-10-02 LAB — GLUCOSE, CAPILLARY
Glucose-Capillary: 109 mg/dL — ABNORMAL HIGH (ref 70–99)
Glucose-Capillary: 105 mg/dL — ABNORMAL HIGH (ref 70–99)
Glucose-Capillary: 114 mg/dL — ABNORMAL HIGH (ref 70–99)
Glucose-Capillary: 211 mg/dL — ABNORMAL HIGH (ref 70–99)

## 2023-10-02 NOTE — Progress Notes (Signed)
Occupational Therapy Discharge Summary  Patient Details  Name: Walter [name] MRN: [id number] Date of Birth: 1946/04/21  Date of Discharge from OT service:October 03, 2023  {CHL IP REHAB OT TIME CALCULATIONS:304400400}   Patient has met {NUMBERS 0-12:18577} of {NUMBERS 0-12:18577} long term goals due to improved activity tolerance, improved balance, postural control, ability to compensate for deficits, improved attention, improved awareness, and improved coordination.  Patient to discharge at overall {LOA:3049010} level.  Patient's care partner is independent to provide the necessary physical and cognitive assistance at discharge.    Reasons goals not met: ***  Recommendation:  Patient will benefit from ongoing skilled OT services in {setting:3041680} to continue to advance functional skills in the area of {ADL/iADL:3041649}.  Equipment: {equipment:3041657}  Reasons for discharge: treatment goals met and discharge from hospital  Patient/family agrees with progress made and goals achieved: Yes  OT Discharge Precautions/Restrictions    General   Vital Signs Therapy Vitals Temp: 97.7 F (36.5 C) Temp Source: Oral Pulse Rate: 84 Resp: 18 BP: 129/63 Patient Position (if appropriate): Lying Oxygen Therapy SpO2: 98 % O2 Device: Room Air Pain   ADL ADL Grooming: Maximal assistance Upper Body Bathing: Minimal assistance Where Assessed-Upper Body Bathing: Shower Lower Body Bathing: Maximal assistance Where Assessed-Lower Body Bathing: Shower Upper Body Dressing: Maximal assistance Where Assessed-Upper Body Dressing: Wheelchair Lower Body Dressing: Maximal assistance Where Assessed-Lower Body Dressing: Administrator, arts: Walk in shower, Transfer tub bench, Acupuncturist: Moderate assistance Film/video editor Method: Dealer      Trunk/Postural Assessment     Balance   Extremity/Trunk Assessment     Tx session General: "subjective***" Pt supine in bed upon OT arrival, agreeable to OT session. "subjective***" Pt seated in W/C upon OT arrival, agreeable to OT.  Vital Signs:  Pain:  ADL:  Balance  Exercises:  Other Treatments:    ***Pt seated in W/C at end of session with W/C alarm donned, call light within reach and 4Ps assessed.  ***Pt supine in bed with bed alarm activated, 2 bed rails up, call light within reach and 4Ps assessed.'  [name], OTD, OTR/L 10/02/2023, 4:01 PM

## 2023-10-02 NOTE — Progress Notes (Signed)
Physical Therapy Weekly Progress Note  Patient Details  Name: Walter [name] MRN: [id number] Date of Birth: 03-30-1946  Beginning of progress report period: September 22, 2023 End of progress report period: October 02, 2023  Today's Date: 10/02/2023 PT Individual Time: 0800-0915 PT Individual Time Calculation (min): 75 min    Patient is making excellent progress towards long term goals and largely supervision for bed mobility, transfers, and gait up to ~200 ft with RW with min cues for large amplitude movements. Pt CGA/supervision with navigation of 12 step (6 inches) with 2 HR's to simulate home environment. Plan to continue high level gait and balance training to prepare for discharge along with pt/family education.   Patient continues to demonstrate the following deficits muscle weakness, decreased cardiorespiratoy endurance, decreased coordination, and decreased sitting balance, decreased standing balance, and decreased postural control and therefore will continue to benefit from skilled PT intervention to increase functional independence with mobility.  Patient progressing toward long term goals..  Continue plan of care.  PT Short Term Goals Week 1:  PT Short Term Goal 1 (Week 1): STG = LTG due to ELOS PT Short Term Goal 1 - Progress (Week 1): Progressing toward goal Week 2:  PT Short Term Goal 1 (Week 2): STG's=LTG's due to ELOS  Skilled Therapeutic Interventions/Progress Updates:      Therapy Documentation Precautions:  Precautions Precautions: Fall Precaution Comments: Parkinsons Restrictions Weight Bearing Restrictions: No  PT session with emphasis on functional mobility and dynamic balance training. Pt supervision with sit<>stand and ambulatory transfer to toilet, continent of bowel and bladder, nurse notified. Pt min A for donning pants and dependent for shoes.   Provided rest/repositioning during session as pt with unrated back pain with mobility and balance  exercises.   Pt supervsion with gait training with emphasis on "large" steps ~150 ft to main gym.   Pt performed OH physio ball circles CW and CCW x 10 each direction with following activities to encourage large amplitude movements and truncal extension:   -compliant surface (close supervision)   -airex pad standard base of support (close supervision)   -airex pad narrow base of support (CGA/min A)   -airex pad right semi-tandem stance (CGA/min A)   Requires CGA/min A for forward stepping on/off from complaint to non-compliant airex foam pad 3 x 6. Pt demonstrates improved balance with leading of left foot compared to right. Pt performed additional step ups while holding 2# dumb bells in bilateral hands min A.   Pt ambulated to room ~150 ft with supervision and left semi-reclined in bed with all needs in reach and alarm on.     Therapy/Group: Individual Therapy  Truitt Leep Truitt Leep PT, DPT  10/02/2023, 7:53 AM

## 2023-10-02 NOTE — Progress Notes (Signed)
Physical Therapy Session Note  Patient Details  Name: Walter [name] MRN: [id number] Date of Birth: 1946-04-26  Today's Date: 10/02/2023 PT Individual Time: 1300-1359 PT Individual Time Calculation (min): 59 min   Short Term Goals: Week 1:  PT Short Term Goal 1 (Week 1): STG = LTG due to ELOS PT Short Term Goal 1 - Progress (Week 1): Progressing toward goal  Skilled Therapeutic Interventions/Progress Updates:      Pt supine in bed upon arrival. Pt agreeable to therapy. Pt denies any pain.   Pt performed ambulatory transfer to [facility] with RW and CGA, pt attempted to use the bathroom but unable.   Pt ambulated from room to main gym, main gym to room with RW and CGA, verbal cues provided for increased step length, to demos improved step length and decreased festination with cues to stop ambulation upon festination and re-initiate gait.   Pt performed step overs over yoga step in // bars with CGA x10 forward, x10 laterally bilaterally, verbal cues provided for big steps  Pt ambulated 40 feet with no AD and CGA, with increased step length with verbal cues to get from point A to point B in 15 steps.   Pt demos increased festination when performing ambulatory transfers, however mildly improved with cues for big steps (after repetitious practice of turns in // bars).   Sit to supine with supervision.   Pt supine in bed at end of session with all needs within reach and bed alarm on.      Therapy Documentation Precautions:  Precautions Precautions: Fall Precaution Comments: Parkinsons Restrictions Weight Bearing Restrictions: No  Therapy/Group: Individual Therapy  [facility] [name], [place], DPT  10/02/2023, 7:59 AM

## 2023-10-02 NOTE — Progress Notes (Signed)
Occupational Therapy Session Note  Patient Details  Name: Walter [name] MRN: [id number] Date of Birth: 12-May-1946  Today's Date: 10/02/2023 OT Individual Time: 7829-5621 OT Individual Time Calculation (min): 60 min    Short Term Goals: Week 2:  OT Short Term Goal 1 (Week 2): STG = LTG (Due to ELOS)  Skilled Therapeutic Interventions/Progress Updates:      Therapy Documentation Precautions:  Precautions Precautions: Fall Precaution Comments: Parkinsons Restrictions Weight Bearing Restrictions: No General: "I missed you!" Pt supine in bed upon OT arrival, agreeable to OT session.  Pain: no pain reported  ADL: Transfers/functional mobility: Pt ambulated from room><therapy gym with VC for increased stride length SBA  Exercises: Pt completed 15 minutes of nu step bike in order to increase BUE/BLEstrength and endurance in preparation for increased independence in ADLs such as bathing. No Rest break on level 5 resistance. Pt keeping a consistent moderate pace throughout activity.   Pt completed the following exercise circuit in order to improve functional activity, strength and endurance to prepare for ADLs such as bathing. Pt completed the following exercises in seated/standing position with no noted LOB/SOB and 3x10 repetitions on each exercise: -forward kicks -standing marches -sit to stands with 2.2# medicine ball with no use of RW   Other Treatments: Pt ambulated throughout therapy gym with RW at SBA level to complete scavenger hunt to retrieve wash cloths. Washcloths at various heights, pt utilizing reacher. Pt retrieved washcloths with VC for increased stride length. Pt completed activity in order to increase scanning, balance, functional transfers.    Pt supine in bed with bed alarm activated, 2 bed rails up, call light within reach and 4Ps assessed.   Therapy/Group: Individual Therapy  [name], OTD, OTR/L 10/02/2023, 12:29 PM

## 2023-10-02 NOTE — Progress Notes (Signed)
PROGRESS NOTE   Subjective/Complaints:  Pt doing well again today, slept well, denies pain, LBM this morning, urinating ok, denies any other complaints or concerns today.   ROS:   Pt denies SOB, abd pain, CP, N/V/C/D, and vision changes   Except for HPI  Objective:   No results found. No results for input(s): "WBC", "HGB", "HCT", "PLT" in the last 72 hours.  No results for input(s): "NA", "K", "CL", "CO2", "GLUCOSE", "BUN", "CREATININE", "CALCIUM" in the last 72 hours.   Intake/Output Summary (Last 24 hours) at 10/02/2023 0940 Last data filed at 10/02/2023 0700 Gross per 24 hour  Intake 743 ml  Output 1325 ml  Net -582 ml        Physical Exam: Vital Signs Blood pressure (!) 159/72, pulse 77, temperature 98 F (36.7 C), temperature source Oral, resp. rate 18, height 5\' 7"  (1.702 m), weight 64 kg, SpO2 96%.       General: awake, alert, appropriate, working with PT; NAD HENT: conjugate gaze; oropharynx moist CV: regular rate and rhythm; no JVD Pulmonary: CTA B/L; no W/R/R- good air movement GI: soft, NT, ND, (+)BS Psychiatric: appropriate Neurological: alert  PRIOR EXAMS: Skin; Abdominal incision glued- looks good- no drainage- only localized erythema around glue-looks great- healing great L groin incision angry/reddened- R gorin looks great   Assessment/Plan: 1. Functional deficits which require 3+ hours per day of interdisciplinary therapy in a comprehensive inpatient rehab setting. Physiatrist is providing close team supervision and 24 hour management of active medical problems listed below. Physiatrist and rehab team continue to assess barriers to discharge/monitor patient progress toward functional and medical goals  Care Tool:  Bathing    Body parts bathed by patient: Right arm, Left arm, Chest, Abdomen, Front perineal area, Right upper leg, Left upper leg, Face, Buttocks, Right lower leg, Left  lower leg   Body parts bathed by helper: Left lower leg, Right lower leg, Buttocks     Bathing assist Assist Level: Supervision/Verbal cueing     Upper Body Dressing/Undressing Upper body dressing   What is the patient wearing?: Pull over shirt    Upper body assist Assist Level: Set up assist    Lower Body Dressing/Undressing Lower body dressing      What is the patient wearing?: Underwear/pull up, Pants     Lower body assist Assist for lower body dressing: Contact Guard/Touching assist     Toileting Toileting Toileting Activity did not occur (Clothing management and hygiene only): N/A (no void or bm)  Toileting assist Assist for toileting: Supervision/Verbal cueing     Transfers Chair/bed transfer  Transfers assist     Chair/bed transfer assist level: Supervision/Verbal cueing     Locomotion Ambulation   Ambulation assist      Assist level: Minimal Assistance - Patient > 75% Assistive device: Walker-rolling Max distance: 200'   Walk 10 feet activity   Assist     Assist level: Supervision/Verbal cueing Assistive device: Walker-rolling   Walk 50 feet activity   Assist    Assist level: Contact Guard/Touching assist Assistive device: Walker-rolling    Walk 150 feet activity   Assist    Assist level: Contact Guard/Touching assist Assistive  device: Walker-rolling    Walk 10 feet on uneven surface  activity   Assist     Assist level: Minimal Assistance - Patient > 75% Assistive device: Walker-rolling   Wheelchair     Assist Is the patient using a wheelchair?: No             Wheelchair 50 feet with 2 turns activity    Assist            Wheelchair 150 feet activity     Assist          Blood pressure (!) 159/72, pulse 77, temperature 98 F (36.7 C), temperature source Oral, resp. rate 18, height 5\' 7"  (1.702 m), weight 64 kg, SpO2 96%.  Medical Problem List and Plan: Debility following AAA repair              -patient may shower             -ELOS/Goals: S 10-14 days           D/c 12/3  Con't CIR PT and OT- therapy off today  Found likely cause of bleeding and treated- per GI- they signed off.  2.  Antithrombotics: -DVT/anticoagulation:  Mechanical: Sequential compression devices, below knee Bilateral lower extremities 11/21-11/22 not on Lovenox due to drop in Hb as well as low plts              -antiplatelet therapy: Aspirin 81 mg daily   3. Pain Management: Tylenol, oxycodone, and robaxin as needed   11/22- asked nursing to bring pain meds for neck pain  11/25- denies pain this AM 4. Mood/Behavior/Sleep: LCSW to evaluate and provide emotional support             -antipsychotic agents: n/a   5. Neuropsych/cognition: This patient is not capable of making decisions on his own behalf. Due to dementia   6. Skin/Wound Care: Routine skin care checks   7. Fluids/Electrolytes/Nutrition: Routine Is and Os and follow-up chemistries             -dysphagia 3 diet with thin liquids/carb modified   8: Hypertension: monitor TID and prn             -home losartan 100 mg daily and amlodipine 5 mg daily held  11/22- BP somewhat elevated for last 24 hours- will restart home Norvasc 5 mg daily and monitor 11-23: Got a single dose of amlodipine 10 mg exam; reduce back down to 5, monitor 2 to 3 days before further increase -09/25/23 BP a little variable, monitor trend with med adjustment 11/25- BP running 130s to 150s systolic- if still up tomorrow- will add back Losartan- home medicine.  11/26- Add back Losartan 25 mg daily for BP running 130s-140s'  11/27- BP a little elevated this AM- in 140s-150s- but is NPO- will mointor for trend 11/28- somewhat labile- 100s-150's- will change Losartan to at bedtime starting tomorrow -10/02/23 BP labile still, but just changed meds; monitor trend Vitals:   09/28/23 1447 09/28/23 1920 09/29/23 0445 09/29/23 1547  BP: 131/60 (!) 149/64 (!) 151/66 (!) 148/77    09/29/23 1926 09/30/23 0315 09/30/23 1259 09/30/23 1933  BP: 138/65 (!) 156/64 128/65 136/66   10/01/23 0400 10/01/23 1433 10/01/23 2006 10/02/23 0440  BP: 136/70 (!) 141/70 (!) 160/68 (!) 159/72    9: 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 Randie Heinz.             -  continue aspirin             -follow-up [facility]   10: AKI: improving    11/22- Cr down to 1.06- doing better 11: ABLA/chronic anemia: downward drift in H and H over last 48 hours, continue iron 11/21- Hb down to 7.3 from 8.8- in 2 days- will recheck in AM- check FOBT daily x 3 days, start Fe sulfate 325 mg qday.  Had colonoscopy in Spring 2024- no issues- will also call [facility] to inform them -they don't want any additional imaging at this time.  11/22- no BM to do hemoccult- also Hb up to 7.5 today- will recheck Monday and f/u on this.  -09/25/23 no FOBT done, even though he's had BMs; asked nursing to please obtain this. Labs ordered for tomorrow already.  11/25- FOBT (+)- and Hb down to 7.1- however no Increase in BUN to signal fast GI bleed- will transfuse if pt will allow- D/w PA- and she will do transfusion if pt allows. 11/26- pt s/p 2 units pRBCs- up to 9.3- per GI, would order CT of abd/pelvis with contrast- ordered this AM- to look fo raorto-enteric fistula- or intra-abdominal source of bleeding. 11/27- SBE due today- is NPO for it- will check labs this AM and tomorrow   11/28- Hb 9.4 (just bumped yesterday- but back to where it was)- 2 angioectasias found- and well treated with argon plasma 12: Prediabetes: home metformin 1000 mg held             -continue SSI 11/21- well controlled- con't regimen- will restart Metformin 500 mg BID- since renal function doing better- wife doesn't want him receiving insulin by d/c (he's only on SSI).  11/22- BG's running 118 to 180's- will con't Metformin- home dose 1000 mg BID- but will wait to  increase for a few days.   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 Hypokalemia  11/21- K+ 3.4- will replete with 40 mEq x1 and recheck in AM  11/22- K+ up to 3.9  11/25- K+ 3.5  11/26- K+ 3.7  11/27- labs in AM  11/28- K+ 3.9 18. Thrombocytopenia 11/22- plts up to 160k from low of 86k- but will not restart Lovenox since Hb so low.   11/25- Back up to 258k 19. Constipation: on colace 100mg  daily  11/22- LBM 2 days ago- if no BM by tomorrow, will need intervention  -09/25/23 BMs yesterday and today; monitor  -10/02/23 having fairly regular BMs, cont regimen 20 Hypocalcemia  11/25- will add Tums- 400 units BID with meals and recheck Thursday  11/26- Ca 8.2 this AM- improving  11/27- will check labs in AM  11/28- Ca 8.1 21/ Cellulitis/infected incision  11/28- will start Keflex 500 mg 8 hours for L groin incision- and monitor    LOS: 11 days A FACE TO FACE EVALUATION WAS PERFORMED  [address] 10/02/2023, 9:40 AM

## 2023-10-03 DIAGNOSIS — Z66 Do not resuscitate: Secondary | ICD-10-CM | POA: Diagnosis not present

## 2023-10-03 DIAGNOSIS — K264 Chronic or unspecified duodenal ulcer with hemorrhage: Secondary | ICD-10-CM | POA: Diagnosis not present

## 2023-10-03 DIAGNOSIS — Z48812 Encounter for surgical aftercare following surgery on the circulatory system: Secondary | ICD-10-CM | POA: Diagnosis not present

## 2023-10-03 DIAGNOSIS — R5381 Other malaise: Secondary | ICD-10-CM | POA: Diagnosis not present

## 2023-10-03 DIAGNOSIS — K31811 Angiodysplasia of stomach and duodenum with bleeding: Secondary | ICD-10-CM | POA: Diagnosis not present

## 2023-10-03 LAB — CBC
HCT: 31.6 % — ABNORMAL LOW (ref 39.0–52.0)
Hemoglobin: 9.9 g/dL — ABNORMAL LOW (ref 13.0–17.0)
MCH: 28.5 pg (ref 26.0–34.0)
MCHC: 31.3 g/dL (ref 30.0–36.0)
MCV: 91.1 fL (ref 80.0–100.0)
Platelets: 243 10*3/uL (ref 150–400)
RBC: 3.47 MIL/uL — ABNORMAL LOW (ref 4.22–5.81)
RDW: 14.6 % (ref 11.5–15.5)
WBC: 7.1 10*3/uL (ref 4.0–10.5)
nRBC: 0 % (ref 0.0–0.2)

## 2023-10-03 LAB — BASIC METABOLIC PANEL
Anion gap: 10 (ref 5–15)
BUN: 19 mg/dL (ref 8–23)
CO2: 26 mmol/L (ref 22–32)
Calcium: 8.8 mg/dL — ABNORMAL LOW (ref 8.9–10.3)
Chloride: 106 mmol/L (ref 98–111)
Creatinine, Ser: 1.24 mg/dL (ref 0.61–1.24)
GFR, Estimated: 60 mL/min — ABNORMAL LOW (ref >60)
Glucose, Bld: 99 mg/dL (ref 70–99)
Potassium: 3.7 mmol/L (ref 3.5–5.1)
Sodium: 142 mmol/L (ref 135–145)

## 2023-10-03 LAB — GLUCOSE, CAPILLARY
Glucose-Capillary: 93 mg/dL (ref 70–99)
Glucose-Capillary: 142 mg/dL — ABNORMAL HIGH (ref 70–99)
Glucose-Capillary: 119 mg/dL — ABNORMAL HIGH (ref 70–99)
Glucose-Capillary: 101 mg/dL — ABNORMAL HIGH (ref 70–99)

## 2023-10-03 NOTE — Progress Notes (Signed)
Patient was walking with physical therapist with onset of anterior left chest pain resolved with rest. He is comfortable, denies palpitations, nausea, SOB or dizziness. Heart rate by auscultation ~80-90. Rhythm is regular. Lungs CTA bilaterally. No diaphoresis or increased WOB. Abd/thorax non-tender to palpation.   New VS: BP (!) 144/74 MAP (mmHg) 93 BP Location Left Arm BP Method Automatic Patient Position (if appropriate) Lying Pulse Rate 98 Resp 19 MEWS COLOR MEWS Score Color Green Oxygen Therapy SpO2 100 %   Check 12 lead EKG and continue to monitor. EKG NSR, vent rate 88  1615 hrs: Resting comfortably. Denies pain.

## 2023-10-03 NOTE — Progress Notes (Signed)
    Patient is doing well in rehab today.  His wife was updated via telephone.  His right groin incision and abdominal incision are well-healed and the left groin incision is healing well also with some scabbing and he needs to keep this clean and dry.  No evidence of infection.  Both dorsalis pedis pulses are palpable.   Darsi Tien C. Randie Heinz, MD [facility] Office: [phone] Pager: [phone]

## 2023-10-03 NOTE — Progress Notes (Signed)
Physical Therapy Session Note  Patient Details  Name: Walter [name] MRN: [id number] Date of Birth: September 23, 1946  Today's Date: 10/03/2023 PT Individual Time: 9147-8295, [id number]  PT Individual Time Calculation (min): 73 min, 57 min   Short Term Goals: Week 2:  PT Short Term Goal 1 (Week 2): STG's=LTG's due to ELOS  Skilled Therapeutic Interventions/Progress Updates:      Therapy Documentation Precautions:  Precautions Precautions: Fall Precaution Comments: Parkinsons Restrictions Weight Bearing Restrictions: No   Treatment Session 1:   PT session with emphasis on balance and coordination assessment and intervention to address functional deficits. Pt with unrated toe pain, dissipates with movement. PT assessed pain interference, sensation, coordination, strength and balance in preparation for discharge.   Pt supervision with transfers and gait with RW ~300 ft to dayroom with min cues for "large" steps. PT assessed balance, coordination and activity tolerance with TUG and FGA assessment. Pt demonstrates significant improvement in TUG from evaluation 23 seconds to 13 seconds.   Patient demonstrates increased fall risk as noted by score of 15/30 on  Functional Gait Assessment.   <22/30 = predictive of falls, <20/30 = fall in 6 months, <18/30 = predictive of falls in PD MCID: 5 points stroke population, 4 points geriatric population (ANPTA Core Set of Outcome Measures for Adults with Neurologic Conditions, 2018)  Pt (S) for dynamic standing balance with tidal tank as pt performed 3 x 10 of chest press and deadlifts with visual demonstration for technique.   Pt ambulated to room (S) and left semi-reclined in bed with all needs in reach and alarm on.    Treatment Session 2:   PT session with emphasis on global strength and dynamic balance training. Pt without reports of pain and supervision with all mobility in session.   Pt ambulatory with RW to main gym ~150 ft and able to  recall and verbalize cue of "large" with step lengths.   Pt performed resisted green thera band sit to stands to increase posterior chain activation 2 x 8. Pt transitioned to eccentric lowering with stand to sit with verbal cue of slow and pacing x 8. Pt performed additional 2 x 8 with therapist resisted purple band.   Pt practice sit<>stand 2 x 10 on airex pad to challenge balance. Pt with decreased activity tolerance and fatigue and presented with difficulty maintaining static standing balance on foam while holding ball and required min/mod A for safely sitting to mat.   Pt CGA for dynamic standing balance without AD for navigating 5 hurdles x 2 (6 inches) in succession.     Therapy/Group: Individual Therapy  Truitt Leep Truitt Leep PT, DPT  10/03/2023, 3:50 PM

## 2023-10-03 NOTE — Progress Notes (Signed)
Physical Therapy Discharge Summary  Patient Details  Name: Walter [name] MRN: [id number] Date of Birth: [birth year]  Date of Discharge from PT service:October 03, 2023  Today's Date: 10/03/2023  Patient has met 8 of 8 long term goals due to improved activity tolerance, improved balance, improved postural control, increased strength, ability to compensate for deficits, and improved coordination.  Patient to discharge at an ambulatory level Supervision.     Reasons goals not met: N/A   Recommendation:  Patient will benefit from ongoing skilled PT services in home health setting to continue to advance safe functional mobility, address ongoing impairments in balance, coordination, and minimize fall risk.  Equipment: No equipment provided  Reasons for discharge: treatment goals met and discharge from hospital  Patient/family agrees with progress made and goals achieved: Yes  PT Discharge Pain Interference Pain Interference Pain Effect on Sleep: 2. Occasionally Pain Interference with Therapy Activities: 1. Rarely or not at all Pain Interference with Day-to-Day Activities: 1. Rarely or not at all Cognition Overall Cognitive Status: History of cognitive impairments - at baseline Arousal/Alertness: Awake/alert Orientation Level: Oriented X4 Focused Attention: Appears intact Sustained Attention: Appears intact Selective Attention: Appears intact Memory: Appears intact Awareness: Appears intact Problem Solving: Appears intact Safety/Judgment: Appears intact Sensation Sensation Light Touch: Appears Intact Hot/Cold: Appears Intact Proprioception: Appears Intact Stereognosis: Appears Intact Coordination Gross Motor Movements are Fluid and Coordinated: No Fine Motor Movements are Fluid and Coordinated: No Coordination and Movement Description: Festination with turns and occasional tremor from parkinsons Finger Nose Finger Test: bilateral tremors 2/2 PD Heel Shin Test: impaired  coordination 2/2 PD Motor  Motor Motor: Other (comment);Abnormal postural alignment and control Motor - Skilled Clinical Observations: hx of parkinsons- improved mobility  Mobility Bed Mobility Bed Mobility: Rolling Right;Rolling Left;Sit to Supine;Supine to Sit Rolling Right: Independent with assistive device Rolling Left: Independent with assistive device Supine to Sit: Independent with assistive device Sit to Supine: Independent with assistive device Transfers Transfers: Sit to Stand;Stand to Sit;Stand Pivot Transfers Sit to Stand: Supervision/Verbal cueing Stand to Sit: Supervision/Verbal cueing Stand Pivot Transfers: Supervision/Verbal cueing Stand Pivot Transfer Details: Verbal cues for gait pattern Transfer (Assistive device): Rolling walker Locomotion  Gait Ambulation: Yes Gait Assistance: Supervision/Verbal cueing Gait Distance (Feet): 300 Feet Assistive device: Rolling walker Gait Assistance Details: Verbal cues for gait pattern Gait Gait: Yes Gait Pattern: Impaired Gait Pattern: Festinating;Trunk flexed Gait velocity: WFL Stairs / Additional Locomotion Stairs: Yes Stairs Assistance: Contact Guard/Touching assist Stair Management Technique: Two rails;Alternating pattern;Forwards Number of Stairs: 12 Height of Stairs: 6 Ramp: Supervision/Verbal cueing Curb: Contact Guard/Touching assist Wheelchair Mobility Wheelchair Mobility: No  Trunk/Postural Assessment  Cervical Assessment Cervical Assessment: Exceptions to [facility] (forward head) Thoracic Assessment Thoracic Assessment: Exceptions to [facility] (forward flexed) Lumbar Assessment Lumbar Assessment: Exceptions to [facility] (posterior pelvic tilt) Postural Control Postural Control: Within Functional Limits Trunk Control: posterior bias when unsupported sitting Righting Reactions: WFL Protective Responses: WFL  Balance Balance Balance Assessed: Yes Standardized Balance Assessment Standardized Balance Assessment: Timed  Up and Go Test;Functional Gait Assessment Timed Up and Go Test TUG: Normal TUG Normal TUG (seconds): 13 Static Sitting Balance Static Sitting - Balance Support: Feet supported;Bilateral upper extremity supported Static Sitting - Level of Assistance: 5: Stand by assistance (supervision) Dynamic Sitting Balance Dynamic Sitting - Balance Support: During functional activity Dynamic Sitting - Level of Assistance: 5: Stand by assistance (supervision) Dynamic Sitting - Balance Activities: Forward lean/weight shifting;Reaching for objects;Reaching across midline Static Standing Balance Static Standing - Balance Support: During functional  activity;Bilateral upper extremity supported Static Standing - Level of Assistance: 5: Stand by assistance (supervision) Dynamic Standing Balance Dynamic Standing - Balance Support: During functional activity;Bilateral upper extremity supported Dynamic Standing - Level of Assistance: 5: Stand by assistance (supervision) Functional Gait  Assessment Gait assessed : Yes Gait Level Surface: Walks 20 ft, slow speed, abnormal gait pattern, evidence for imbalance or deviates 10-15 in outside of the 12 in walkway width. Requires more than 7 sec to ambulate 20 ft. Change in Gait Speed: Able to change speed, demonstrates mild gait deviations, deviates 6-10 in outside of the 12 in walkway width, or no gait deviations, unable to achieve a major change in velocity, or uses a change in velocity, or uses an assistive device. Gait with Horizontal Head Turns: Performs head turns smoothly with slight change in gait velocity (eg, minor disruption to smooth gait path), deviates 6-10 in outside 12 in walkway width, or uses an assistive device. Gait with Vertical Head Turns: Performs task with slight change in gait velocity (eg, minor disruption to smooth gait path), deviates 6 - 10 in outside 12 in walkway width or uses assistive device Gait and Pivot Turn: Cannot turn safely, requires  assistance to turn and stop. Step Over Obstacle: Is able to step over one shoe box (4.5 in total height) without changing gait speed. No evidence of imbalance. Gait with Narrow Base of Support: Ambulates less than 4 steps heel to toe or cannot perform without assistance. Gait with Eyes Closed: Walks 20 ft, uses assistive device, slower speed, mild gait deviations, deviates 6-10 in outside 12 in walkway width. Ambulates 20 ft in less than 9 sec but greater than 7 sec. Ambulating Backwards: Walks 20 ft, uses assistive device, slower speed, mild gait deviations, deviates 6-10 in outside 12 in walkway width. Steps: Alternating feet, must use rail. Total Score: 15 Extremity Assessment  RUE Assessment RUE Assessment: Within Functional Limits LUE Assessment LUE Assessment: Within Functional Limits RLE Assessment RLE Assessment: Within Functional Limits General Strength Comments: Grossly 4+/5 LLE Assessment LLE Assessment: Within Functional Limits General Strength Comments: Grossly 4+/5   Truitt Leep Truitt Leep PT, DPT  10/03/2023, 10:19 AM

## 2023-10-03 NOTE — Progress Notes (Signed)
PROGRESS NOTE   Subjective/Complaints:  Pt doing well again today, slept well, denies pain, LBM this morning, urinating ok, denies any other complaints or concerns today.   ROS:   Pt denies SOB, abd pain, CP, N/V/C/D, and vision changes   Except for HPI  Objective:   No results found. Recent Labs    10/02/23 2347  WBC 7.1  HGB 9.9*  HCT 31.6*  PLT 243    Recent Labs    10/03/23 0700  NA 142  K 3.7  CL 106  CO2 26  GLUCOSE 99  BUN 19  CREATININE 1.24  CALCIUM 8.8*     Intake/Output Summary (Last 24 hours) at 10/03/2023 0830 Last data filed at 10/03/2023 0735 Gross per 24 hour  Intake 397 ml  Output 1300 ml  Net -903 ml        Physical Exam: Vital Signs Blood pressure 137/62, pulse 76, temperature 98.1 F (36.7 C), temperature source Oral, resp. rate 18, height 5\' 7"  (1.702 m), weight 64 kg, SpO2 94%.       General: awake, alert, appropriate, working with PT; NAD HENT: conjugate gaze; oropharynx moist CV: regular rate and rhythm; no JVD Pulmonary: CTA B/L; no W/R/R- good air movement GI: soft, NT, ND, (+)BS Psychiatric: appropriate Neurological: alert  PRIOR EXAMS: Skin; Abdominal incision glued- looks good- no drainage- only localized erythema around glue-looks great- healing great L groin incision angry/reddened- R gorin looks great   Assessment/Plan: 1. Functional deficits which require 3+ hours per day of interdisciplinary therapy in a comprehensive inpatient rehab setting. Physiatrist is providing close team supervision and 24 hour management of active medical problems listed below. Physiatrist and rehab team continue to assess barriers to discharge/monitor patient progress toward functional and medical goals  Care Tool:  Bathing    Body parts bathed by patient: Right arm, Left arm, Chest, Abdomen, Front perineal area, Right upper leg, Left upper leg, Face, Buttocks, Right  lower leg, Left lower leg   Body parts bathed by helper: Left lower leg, Right lower leg, Buttocks     Bathing assist Assist Level: Supervision/Verbal cueing     Upper Body Dressing/Undressing Upper body dressing   What is the patient wearing?: Pull over shirt    Upper body assist Assist Level: Set up assist    Lower Body Dressing/Undressing Lower body dressing      What is the patient wearing?: Underwear/pull up, Pants     Lower body assist Assist for lower body dressing: Contact Guard/Touching assist     Toileting Toileting Toileting Activity did not occur (Clothing management and hygiene only): N/A (no void or bm)  Toileting assist Assist for toileting: Supervision/Verbal cueing     Transfers Chair/bed transfer  Transfers assist     Chair/bed transfer assist level: Supervision/Verbal cueing     Locomotion Ambulation   Ambulation assist      Assist level: Minimal Assistance - Patient > 75% Assistive device: Walker-rolling Max distance: 200'   Walk 10 feet activity   Assist     Assist level: Supervision/Verbal cueing Assistive device: Walker-rolling   Walk 50 feet activity   Assist    Assist level: Contact Guard/Touching  assist Assistive device: Walker-rolling    Walk 150 feet activity   Assist    Assist level: Contact Guard/Touching assist Assistive device: Walker-rolling    Walk 10 feet on uneven surface  activity   Assist     Assist level: Minimal Assistance - Patient > 75% Assistive device: Walker-rolling   Wheelchair     Assist Is the patient using a wheelchair?: No             Wheelchair 50 feet with 2 turns activity    Assist            Wheelchair 150 feet activity     Assist          Blood pressure 137/62, pulse 76, temperature 98.1 F (36.7 C), temperature source Oral, resp. rate 18, height 5\' 7"  (1.702 m), weight 64 kg, SpO2 94%.  Medical Problem List and Plan: Debility following  AAA repair             -patient may shower             -ELOS/Goals: S 10-14 days           D/c 12/3 Con't CIR PT and OT  D/c tomorrow  Found likely cause of bleeding and treated- per GI- they signed off.  2.  Antithrombotics: -DVT/anticoagulation:  Mechanical: Sequential compression devices, below knee Bilateral lower extremities 11/21-11/22 not on Lovenox due to drop in Hb as well as low plts              -antiplatelet therapy: Aspirin 81 mg daily   3. Pain Management: Tylenol, oxycodone, and robaxin as needed   11/22- asked nursing to bring pain meds for neck pain  11/25- denies pain this AM 4. Mood/Behavior/Sleep: LCSW to evaluate and provide emotional support             -antipsychotic agents: n/a   5. Neuropsych/cognition: This patient is not capable of making decisions on his own behalf. Due to dementia   6. Skin/Wound Care: Routine skin care checks   7. Fluids/Electrolytes/Nutrition: Routine Is and Os and follow-up chemistries             -dysphagia 3 diet with thin liquids/carb modified   8: Hypertension: monitor TID and prn             -home losartan 100 mg daily and amlodipine 5 mg daily held  11/22- BP somewhat elevated for last 24 hours- will restart home Norvasc 5 mg daily and monitor 11-23: Got a single dose of amlodipine 10 mg exam; reduce back down to 5, monitor 2 to 3 days before further increase -09/25/23 BP a little variable, monitor trend with med adjustment 11/25- BP running 130s to 150s systolic- if still up tomorrow- will add back Losartan- home medicine.  11/26- Add back Losartan 25 mg daily for BP running 130s-140s'  11/27- BP a little elevated this AM- in 140s-150s- but is NPO- will mointor for trend 11/28- somewhat labile- 100s-150's- will change Losartan to at bedtime starting tomorrow -10/02/23 BP labile still, but just changed meds; monitor trend 12/2- BP looking better- more controlled- con't regimen Vitals:   09/29/23 1547 09/29/23 1926 09/30/23  0315 09/30/23 1259  BP: (!) 148/77 138/65 (!) 156/64 128/65   09/30/23 1933 10/01/23 0400 10/01/23 1433 10/01/23 2006  BP: 136/66 136/70 (!) 141/70 (!) 160/68   10/02/23 0440 10/02/23 1507 10/02/23 1955 10/03/23 0434  BP: (!) 159/72 129/63 129/66 137/62    9: AAA s/p  endovascular repair with left common iliac to left common femoral artery bypass and stent of left external iliac artery/15 by Dr. Randie Heinz.             -continue aspirin             -follow-up [facility]   10: AKI: improving    11/22- Cr down to 1.06- doing better 11: ABLA/chronic anemia: downward drift in H and H over last 48 hours, continue iron 11/21- Hb down to 7.3 from 8.8- in 2 days- will recheck in AM- check FOBT daily x 3 days, start Fe sulfate 325 mg qday.  Had colonoscopy in Spring 2024- no issues- will also call [facility] to inform them -they don't want any additional imaging at this time.  11/22- no BM to do hemoccult- also Hb up to 7.5 today- will recheck Monday and f/u on this.  -09/25/23 no FOBT done, even though he's had BMs; asked nursing to please obtain this. Labs ordered for tomorrow already.  11/25- FOBT (+)- and Hb down to 7.1- however no Increase in BUN to signal fast GI bleed- will transfuse if pt will allow- D/w PA- and she will do transfusion if pt allows. 11/26- pt s/p 2 units pRBCs- up to 9.3- per GI, would order CT of abd/pelvis with contrast- ordered this AM- to look fo raorto-enteric fistula- or intra-abdominal source of bleeding. 11/27- SBE due today- is NPO for it- will check labs this AM and tomorrow   11/28- Hb 9.4 (just bumped yesterday- but back to where it was)- 2 angioectasias found- and well treated with argon plasma 12/2- Hb up to 9.9 12: Prediabetes: home metformin 1000 mg held             -continue SSI 11/21- well controlled- con't regimen- will restart Metformin 500 mg BID- since renal function doing better- wife doesn't want him receiving insulin by d/c (he's only on SSI).  11/22- BG's running  118 to 180's- will con't Metformin- home dose 1000 mg BID- but will wait to increase for a few days.   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 Hypokalemia  11/21- K+ 3.4- will replete with 40 mEq x1 and recheck in AM  11/22- K+ up to 3.9  11/25- K+ 3.5  11/26- K+ 3.7  11/27- labs in AM  11/28- K+ 3.9 18. Thrombocytopenia 11/22- plts up to 160k from low of 86k- but will not restart Lovenox since Hb so low.   11/25- Back up to 258k 19. Constipation: on colace 100mg  daily  11/22- LBM 2 days ago- if no BM by tomorrow, will need intervention  -09/25/23 BMs yesterday and today; monitor  -10/02/23 having fairly regular BMs, cont regimen  12.2- going daily per pt- LBM yesterday 20 Hypocalcemia  11/25- will add Tums- 400 units BID with meals and recheck Thursday  11/26- Ca 8.2 this AM- improving  11/27- will check labs in AM  11/28- Ca 8.1  12/2- Ca up to 8.8 21/  Cellulitis/infected incision  11/28- will start Keflex 500 mg 8 hours for L groin incision- and monitor  12/2- looks much better- con't Keflex   LOS: 12 days A FACE TO FACE EVALUATION WAS PERFORMED  Javaeh Muscatello 10/03/2023, 8:30 AM

## 2023-10-03 NOTE — Progress Notes (Signed)
Occupational Therapy Session Note  Patient Details  Name: Walter [name] MRN: [id number] Date of Birth: August 22, 1946  Today's Date: 10/03/2023 OT Individual Time: 0800-0900 & 1300-1345 OT Individual Time Calculation (min): 60 min & 45 min    Short Term Goals: Week 2:  OT Short Term Goal 1 (Week 2): STG = LTG (Due to ELOS)  Skilled Therapeutic Interventions/Progress Updates:      Therapy Documentation Precautions:  Precautions Precautions: Fall Precaution Comments: Parkinsons Restrictions Weight Bearing Restrictions: No Tx session General: "I feel so much better." Pt supine in bed upon OT arrival, agreeable to OT session.  Pain: no pain reported  ADL: ADL completed this session with levels of assist overall SBA for all ADL tasks. Intermittent use of reacher for LB dressing, was able to bring foot up to body to don socks and shoes. Pt completed bathing seated on shower seat with back at shower level, standing to bathe buttocks with UE support of grab bar. Pt completed grooming/hygiene seated at sink including shaving and hair care, able to manipulate containers for toothpaste. Pt with significant increase in independence, demonstrating the ability to complete ADLs with almost no use of AE for dressing/bathing.  Other Treatments: OT and pt discussed D/C, pt reports no concerns for D/C. Wife was providing supervision at baseline before hospitalization.   Pt seated in recliner at end of session with W/C alarm donned, call light within reach and 4Ps assessed.    Session 2 General: "I'll miss you!" Pt supine in bed upon OT arrival, agreeable to OT session.  Pain: no pain reported  ADL: Toilet transfer: SBA with RW and VC for increased stride length and RW management/safety Toileting: SBA, completed pants management and hygiene after BM  Other Treatments:  BITS Pt completed multiple exercises on the BITS listed below. Pt completed tasks in order to increase scanning, seated  dynamic balance and reaching out of BOS and short term memory in order to increase independence with tasks such as medication management.  Memory Trial -Min difficulty with simple sequencing tasks after 5-6 words -83.33% accuracy with 5 successful trials  -71.43% accuracy with 5 successful trials  -Pt demonstrating repeating words aloud to remember sequences with fair accuracy with trials  Bell Cancellation Test -2 misses, 1 accidentals and 4:18 min to complete -Pt able to scan for bells throughout screen. Pt missed 2 bells on lower and upper left quadrant of screen   Pt supine in bed with bed alarm activated, 2 bed rails up, call light within reach and 4Ps assessed.  Therapy/Group: Individual Therapy  [name], OTD, OTR/L 10/03/2023, 4:17 PM

## 2023-10-04 ENCOUNTER — Other Ambulatory Visit (HOSPITAL_COMMUNITY): Payer: Self-pay

## 2023-10-04 DIAGNOSIS — R5381 Other malaise: Secondary | ICD-10-CM | POA: Diagnosis not present

## 2023-10-04 LAB — GLUCOSE, CAPILLARY
Glucose-Capillary: 109 mg/dL — ABNORMAL HIGH (ref 70–99)
Glucose-Capillary: 103 mg/dL — ABNORMAL HIGH (ref 70–99)

## 2023-10-04 MED ORDER — DOCUSATE SODIUM 100 MG PO CAPS: 100.0000 mg | ORAL_CAPSULE | Freq: Every day | ORAL | Status: AC

## 2023-10-04 MED ORDER — ACETAMINOPHEN 325 MG PO TABS: 325.0000 mg | ORAL_TABLET | ORAL | Status: AC | PRN

## 2023-10-04 MED ORDER — PANTOPRAZOLE SODIUM 40 MG PO TBEC
40.0000 mg | DELAYED_RELEASE_TABLET | Freq: Two times a day (BID) | ORAL | 0 refills | Status: DC
  Filled 2023-10-04: qty 60, 30d supply, fill #0

## 2023-10-04 MED ORDER — METFORMIN HCL 500 MG PO TABS
500.0000 mg | ORAL_TABLET | Freq: Two times a day (BID) | ORAL | 0 refills | Status: DC
  Filled 2023-10-04: qty 60, 30d supply, fill #0

## 2023-10-04 MED ORDER — LOSARTAN POTASSIUM 25 MG PO TABS
25.0000 mg | ORAL_TABLET | Freq: Every day | ORAL | 0 refills | Status: DC
  Filled 2023-10-04: qty 30, 30d supply, fill #0

## 2023-10-04 MED ORDER — CALCIUM CARBONATE ANTACID 500 MG PO CHEW
400.0000 mg | CHEWABLE_TABLET | Freq: Two times a day (BID) | ORAL | 0 refills | Status: DC
  Filled 2023-10-04: qty 150, 38d supply, fill #0

## 2023-10-04 NOTE — Plan of Care (Signed)
  Problem: RH Balance Goal: LTG: Patient will maintain dynamic sitting balance (OT) Description: LTG:  Patient will maintain dynamic sitting balance with assistance during activities of daily living (OT) Outcome: Completed/Met Flowsheets (Taken 09/22/2023 0950 by Melonie Florida, OT) LTG: Pt will maintain dynamic sitting balance during ADLs with: Supervision/Verbal cueing Goal: LTG Patient will maintain dynamic standing with ADLs (OT) Description: LTG:  Patient will maintain dynamic standing balance with assist during activities of daily living (OT)  Outcome: Completed/Met Flowsheets (Taken 09/22/2023 0950 by Melonie Florida, OT) LTG: Pt will maintain dynamic standing balance during ADLs with: Supervision/Verbal cueing   Problem: Sit to Stand Goal: LTG:  Patient will perform sit to stand in prep for activites of daily living with assistance level (OT) Description: LTG:  Patient will perform sit to stand in prep for activites of daily living with assistance level (OT) Outcome: Completed/Met Flowsheets (Taken 09/22/2023 0950 by Melonie Florida, OT) LTG: PT will perform sit to stand in prep for activites of daily living with assistance level: Supervision/Verbal cueing   Problem: RH Grooming Goal: LTG Patient will perform grooming w/assist,cues/equip (OT) Description: LTG: Patient will perform grooming with assist, with/without cues using equipment (OT) Outcome: Completed/Met Flowsheets (Taken 09/22/2023 0950 by Melonie Florida, OT) LTG: Pt will perform grooming with assistance level of: Supervision/Verbal cueing   Problem: RH Bathing Goal: LTG Patient will bathe all body parts with assist levels (OT) Description: LTG: Patient will bathe all body parts with assist levels (OT) Outcome: Completed/Met Flowsheets (Taken 09/22/2023 0950 by Melonie Florida, OT) LTG: Pt will perform bathing with assistance level/cueing: Supervision/Verbal cueing   Problem: RH Dressing Goal: LTG  Patient will perform upper body dressing (OT) Description: LTG Patient will perform upper body dressing with assist, with/without cues (OT). Outcome: Completed/Met Flowsheets (Taken 09/22/2023 0950 by Melonie Florida, OT) LTG: Pt will perform upper body dressing with assistance level of: Supervision/Verbal cueing Goal: LTG Patient will perform lower body dressing w/assist (OT) Description: LTG: Patient will perform lower body dressing with assist, with/without cues in positioning using equipment (OT) Outcome: Completed/Met Flowsheets (Taken 09/22/2023 0950 by Melonie Florida, OT) LTG: Pt will perform lower body dressing with assistance level of: Supervision/Verbal cueing   Problem: RH Toileting Goal: LTG Patient will perform toileting task (3/3 steps) with assistance level (OT) Description: LTG: Patient will perform toileting task (3/3 steps) with assistance level (OT)  Outcome: Completed/Met Flowsheets (Taken 09/22/2023 0950 by Melonie Florida, OT) LTG: Pt will perform toileting task (3/3 steps) with assistance level: Supervision/Verbal cueing   Problem: RH Toilet Transfers Goal: LTG Patient will perform toilet transfers w/assist (OT) Description: LTG: Patient will perform toilet transfers with assist, with/without cues using equipment (OT) Outcome: Completed/Met Flowsheets (Taken 09/22/2023 0950 by Melonie Florida, OT) LTG: Pt will perform toilet transfers with assistance level of: Supervision/Verbal cueing   Problem: RH Tub/Shower Transfers Goal: LTG Patient will perform tub/shower transfers w/assist (OT) Description: LTG: Patient will perform tub/shower transfers with assist, with/without cues using equipment (OT) Outcome: Completed/Met Flowsheets (Taken 10/04/2023 1616) LTG: Pt will perform tub/shower stall transfers with assistance level of: Supervision/Verbal cueing

## 2023-10-04 NOTE — Progress Notes (Signed)
Inpatient Rehabilitation Care Coordinator Discharge Note   Patient Details  Name: Walter [name] MRN: [id number] Date of Birth: [birth year]   Discharge location: HOME WITH WIFE WHO IS ABLE TO PROVIDE 24/7 SUPERVISION CARE  Length of Stay: 13 DAYS  Discharge activity level: SBA-SUPERVISION LEVEL  Home/community participation: LIMITED  Patient response NW:[id number] Literacy - How often do you need to have someone help you when you read instructions, pamphlets, or other written material from your doctor or pharmacy?: Never  Patient response HY:[id number] Isolation - How often do you feel lonely or isolated from those around you?: Never  Services provided included: MD, RD, PT, OT, RN, CM, TR, Pharmacy, Neuropsych, SW  Financial Services:  Field seismologist Utilized: Physiological scientist MEDICARE  Choices offered to/list presented to: PT AND WIFE  Follow-up services arranged:  Home Health, Patient/Family request agency HH/DME Home Health Agency: [facility]  PT  OT  RN   HAS ALL DME FROM PREVIOUS ADMISSIONS   HH/DME Requested Agency: [facility]  Patient response to transportation need: Is the patient able to respond to transportation needs?: Yes In the past 12 months, has lack of transportation kept you from medical appointments or from getting medications?: No In the past 12 months, has lack of transportation kept you from meetings, work, or from getting things needed for daily living?: No   Patient/Family verbalized understanding of follow-up arrangements:  Yes  Individual responsible for coordination of the follow-up plan: Walter [name] [phone]  Confirmed correct DME delivered: Walter [name] 10/04/2023    Comments (or additional information):PT DID WELL AND REACHED HIS GOALS WIFE HAS BEEN IN AND FEELS COMFORTABLE WITH HIS CARE. WAS PROVIDING CARE PRIOR TO ADMISSION  Summary of Stay    Date/Time Discharge Planning CSW  09/27/23 1426 Home with wife  who has assisted with his care prior to admission. Wife has other family members she provdes care to also. Has needed equipment from previous admissions and will set up follow up RGD       Walter [name], Walter [name]

## 2023-10-04 NOTE — Progress Notes (Signed)
PROGRESS NOTE   Subjective/Complaints:  Pt doing well again today, slept well, denies pain, LBM this morning, urinating ok, denies any other complaints or concerns today.   ROS:   Pt denies SOB, abd pain, CP, N/V/C/D, and vision changes   Except for HPI  Objective:   No results found. Recent Labs    10/02/23 2347  WBC 7.1  HGB 9.9*  HCT 31.6*  PLT 243    Recent Labs    10/03/23 0700  NA 142  K 3.7  CL 106  CO2 26  GLUCOSE 99  BUN 19  CREATININE 1.24  CALCIUM 8.8*     Intake/Output Summary (Last 24 hours) at 10/04/2023 0815 Last data filed at 10/04/2023 1610 Gross per 24 hour  Intake 480 ml  Output 970 ml  Net -490 ml        Physical Exam: Vital Signs Blood pressure (!) 148/69, pulse 72, temperature 97.6 F (36.4 C), resp. rate 17, height 5\' 7"  (1.702 m), weight 64 kg, SpO2 98%.       General: awake, alert, appropriate, working with PT; NAD HENT: conjugate gaze; oropharynx moist CV: regular rate and rhythm; no JVD Pulmonary: CTA B/L; no W/R/R- good air movement GI: soft, NT, ND, (+)BS Psychiatric: appropriate Neurological: alert  PRIOR EXAMS: Skin; Abdominal incision glued- looks good- no drainage- only localized erythema around glue-looks great- healing great L groin incision angry/reddened- R gorin looks great   Assessment/Plan: 1. Functional deficits which require 3+ hours per day of interdisciplinary therapy in a comprehensive inpatient rehab setting. Physiatrist is providing close team supervision and 24 hour management of active medical problems listed below. Physiatrist and rehab team continue to assess barriers to discharge/monitor patient progress toward functional and medical goals  Care Tool:  Bathing    Body parts bathed by patient: Right arm, Left arm, Chest, Abdomen, Front perineal area, Right upper leg, Left upper leg, Face, Buttocks, Right lower leg, Left lower leg    Body parts bathed by helper: Left lower leg, Right lower leg, Buttocks     Bathing assist Assist Level: Supervision/Verbal cueing     Upper Body Dressing/Undressing Upper body dressing   What is the patient wearing?: Pull over shirt    Upper body assist Assist Level: Set up assist    Lower Body Dressing/Undressing Lower body dressing      What is the patient wearing?: Underwear/pull up, Pants     Lower body assist Assist for lower body dressing: Supervision/Verbal cueing     Toileting Toileting Toileting Activity did not occur (Clothing management and hygiene only): N/A (no void or bm)  Toileting assist Assist for toileting: Supervision/Verbal cueing     Transfers Chair/bed transfer  Transfers assist     Chair/bed transfer assist level: Supervision/Verbal cueing     Locomotion Ambulation   Ambulation assist      Assist level: Supervision/Verbal cueing Assistive device: Walker-rolling Max distance: >300 ft   Walk 10 feet activity   Assist     Assist level: Supervision/Verbal cueing Assistive device: Walker-rolling   Walk 50 feet activity   Assist    Assist level: Supervision/Verbal cueing Assistive device: Walker-rolling  Walk 150 feet activity   Assist    Assist level: Supervision/Verbal cueing Assistive device: Walker-rolling    Walk 10 feet on uneven surface  activity   Assist     Assist level: Supervision/Verbal cueing Assistive device: Walker-rolling   Wheelchair     Assist Is the patient using a wheelchair?: No             Wheelchair 50 feet with 2 turns activity    Assist            Wheelchair 150 feet activity     Assist          Blood pressure (!) 148/69, pulse 72, temperature 97.6 F (36.4 C), resp. rate 17, height 5\' 7"  (1.702 m), weight 64 kg, SpO2 98%.  Medical Problem List and Plan: Debility following AAA repair             -patient may shower             -ELOS/Goals: S  10-14 days           D/c 12/3 D/c today Doesn't need f/u with me- only with [facility]  Found likely cause of bleeding and treated- per GI- they signed off.  2.  Antithrombotics: -DVT/anticoagulation:  Mechanical: Sequential compression devices, below knee Bilateral lower extremities 11/21-11/22 not on Lovenox due to drop in Hb as well as low plts              -antiplatelet therapy: Aspirin 81 mg daily   3. Pain Management: Tylenol, oxycodone, and robaxin as needed   11/22- asked nursing to bring pain meds for neck pain  11/25- denies pain this AM 4. Mood/Behavior/Sleep: LCSW to evaluate and provide emotional support             -antipsychotic agents: n/a   5. Neuropsych/cognition: This patient is not capable of making decisions on his own behalf. Due to dementia   6. Skin/Wound Care: Routine skin care checks   7. Fluids/Electrolytes/Nutrition: Routine Is and Os and follow-up chemistries             -dysphagia 3 diet with thin liquids/carb modified   8: Hypertension: monitor TID and prn             -home losartan 100 mg daily and amlodipine 5 mg daily held  11/22- BP somewhat elevated for last 24 hours- will restart home Norvasc 5 mg daily and monitor 11-23: Got a single dose of amlodipine 10 mg exam; reduce back down to 5, monitor 2 to 3 days before further increase -09/25/23 BP a little variable, monitor trend with med adjustment 11/25- BP running 130s to 150s systolic- if still up tomorrow- will add back Losartan- home medicine.  11/26- Add back Losartan 25 mg daily for BP running 130s-140s'  11/27- BP a little elevated this AM- in 140s-150s- but is NPO- will mointor for trend 11/28- somewhat labile- 100s-150's- will change Losartan to at bedtime starting tomorrow -10/02/23 BP labile still, but just changed meds; monitor trend 12/2- BP looking better- more controlled- con't regimen 12/3- BP running 130s-140s- con't regimen and f/u with PCP Vitals:   09/30/23 1933 10/01/23 0400  10/01/23 1433 10/01/23 2006  BP: 136/66 136/70 (!) 141/70 (!) 160/68   10/02/23 0440 10/02/23 1507 10/02/23 1955 10/03/23 0434  BP: (!) 159/72 129/63 129/66 137/62   10/03/23 1406 10/03/23 1500 10/03/23 2008 10/04/23 0431  BP: (!) 106/59 (!) 144/74 (!) 143/69 (!) 148/69    9: AAA s/p  endovascular repair with left common iliac to left common femoral artery bypass and stent of left external iliac artery/15 by Dr. Randie Heinz.             -continue aspirin             -follow-up [facility]   10: AKI: improving    11/22- Cr down to 1.06- doing better 11: ABLA/chronic anemia: downward drift in H and H over last 48 hours, continue iron 11/21- Hb down to 7.3 from 8.8- in 2 days- will recheck in AM- check FOBT daily x 3 days, start Fe sulfate 325 mg qday.  Had colonoscopy in Spring 2024- no issues- will also call Vascular to inform them -they don't want any additional imaging at this time.  11/22- no BM to do hemoccult- also Hb up to 7.5 today- will recheck Monday and f/u on this.  -09/25/23 no FOBT done, even though he's had BMs; asked nursing to please obtain this. Labs ordered for tomorrow already.  11/25- FOBT (+)- and Hb down to 7.1- however no Increase in BUN to signal fast GI bleed- will transfuse if pt will allow- D/w PA- and she will do transfusion if pt allows. 11/26- pt s/p 2 units pRBCs- up to 9.3- per GI, would order CT of abd/pelvis with contrast- ordered this AM- to look fo raorto-enteric fistula- or intra-abdominal source of bleeding. 11/27- SBE due today- is NPO for it- will check labs this AM and tomorrow   11/28- Hb 9.4 (just bumped yesterday- but back to where it was)- 2 angioectasias found- and well treated with argon plasma 12/2- Hb up to 9.9 12: Prediabetes: home metformin 1000 mg held             -continue SSI 11/21- well controlled- con't regimen- will restart Metformin 500 mg BID- since renal function doing better- wife doesn't want him receiving insulin by d/c (he's only on SSI).   11/22- BG's running 118 to 180's- will con't Metformin- home dose 1000 mg BID- but will wait to increase for a few days.   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 Hypokalemia  11/21- K+ 3.4- will replete with 40 mEq x1 and recheck in AM  11/22- K+ up to 3.9  11/25- K+ 3.5  11/26- K+ 3.7  11/27- labs in AM  11/28- K+ 3.9 18. Thrombocytopenia 11/22- plts up to 160k from low of 86k- but will not restart Lovenox since Hb so low.   11/25- Back up to 258k 19. Constipation: on colace 100mg  daily  11/22- LBM 2 days ago- if no BM by tomorrow, will need intervention  -09/25/23 BMs yesterday and today; monitor  -10/02/23 having fairly regular BMs, cont regimen  12.2- going daily per pt- LBM yesterday 20 Hypocalcemia  11/25- will add Tums- 400 units BID with meals and recheck Thursday  11/26- Ca 8.2 this AM- improving  11/27- will check labs in AM  11/28- Ca 8.1  12/2- Ca up to 8.8 21/ Cellulitis/infected incision  11/28- will start Keflex 500 mg 8 hours for L groin incision- and monitor  12/2- looks much better- con't Keflex  12/3- done with Keflex- looks much better- scabbing.    The patient is medically ready for discharge to home and will not need follow-up with [facility] PM&R. In addition, they will need to follow up with their PCP,  [facility]- explained ot pt needs to see PCP in 2-4 weeks; doesn't need f/u with GI, at this time, they have signed off  .    LOS: 13 days A FACE TO FACE EVALUATION WAS PERFORMED  Walter [name] 10/04/2023, 8:15 AM

## 2023-10-04 NOTE — Progress Notes (Signed)
Inpatient Rehabilitation Discharge Medication Review by a Pharmacist  A complete drug regimen review was completed for this patient to identify any potential clinically significant medication issues.  High Risk Drug Classes Is patient taking? Indication by Medication  Antipsychotic No   Anticoagulant No   Antibiotic No   Opioid No   Antiplatelet Yes Aspirin for PAD  Hypoglycemics/insulin No   Vasoactive Medication Yes Amlodipine and losartan for HTN  Chemotherapy No   Other Yes Acetaminophen for mild pain Loratadine for allergies Multivitamin, Vitamin B, tums, iron for supplement Docusate for constipation Metformin for diabetes Pantoprazole for GERD     Type of Medication Issue Identified Description of Issue Recommendation(s)  Drug Interaction(s) (clinically significant)     Duplicate Therapy     Allergy     No Medication Administration End Date     Incorrect Dose     Additional Drug Therapy Needed     Significant med changes from prior encounter (inform family/care partners about these prior to discharge). NEW tums, acetaminophen, metformin  Team to educate  Other       Clinically significant medication issues were identified that warrant physician communication and completion of prescribed/recommended actions by midnight of the next day:  No  Name of provider notified for urgent issues identified:   Provider Method of Notification:     Pharmacist comments:   Time spent performing this drug regimen review (minutes):  15  Alphia Moh, PharmD, [place],  [facility] Clinical Pharmacist  Please check AMION for all [facility] phone numbers After 10:00 PM, call Main Pharmacy [phone]

## 2023-10-07 NOTE — Discharge Summary (Signed)
Discharge Summary  Walter [name]  MRN: [id number] DOB/AGE: May 27, 1946 77 y.o.  Admit date: 09/16/2023 Discharge date: 09/21/2023  Admission Diagnosis: Abdominal aortic aneurysm  Discharge Diagnoses:  Same  Secondary Diagnoses: Principal Problem:   S/P abdominal aortic aneurysm repair Active Problems:   Abdominal aortic aneurysm [facility])   Procedures: Procedure Performed: 1.  Coil right common iliac artery with 12 mm Nester coils x 8 and 10 mm Nester coil 2.  Stent of left external iliac artery with 9 x 59 and 8 x 59 mm VBX 3.  Left common iliac to left common femoral artery bypass with 10 mm dacron graft with associated left common iliac and left common femoral endarterectomy 4.  Repair of abdominal aortic aneurysm with Cook 32 x 113 mm AUI extended with 24 x 74 Zenith limb into left common iliac artery 5.  Endo anchor placement x 6 proximal aortic aneurysm graft 6.  Left to right femoral to femoral bypass with right common femoral endarterectomy  Discharged Condition: fair  Hospital Course: Patient was admitted to the ICU after abdominal aortic aneurysm repair with acute blood loss anemia and hypotension.  After aggressive resuscitation the patient recovered well and was working with physical therapy and was ready for discharge to CIR.  Consults:  Pulmonary critical care Rehab  Significant Diagnostic Studies: CBC    Latest Ref Rng & Units 10/02/2023   11:47 PM 09/29/2023    4:08 AM 09/28/2023    8:17 AM  CBC  WBC 4.0 - 10.5 K/uL 7.1  10.3  9.0   Hemoglobin 13.0 - 17.0 g/dL 9.9  9.4  08.6   Hematocrit 39.0 - 52.0 % 31.6  29.4  31.9   Platelets 150 - 400 K/uL 243  285  301      Disposition: Discharge disposition: [facility] Not Defined       Discharge Instructions     Call MD for:  redness, tenderness, or signs of infection (pain, swelling, bleeding, redness, odor or green/yellow discharge around incision site)   Complete by:  As directed    Call MD for:  severe or increased pain, loss or decreased feeling  in affected limb(s)   Complete by: As directed    Call MD for:  temperature >100.5   Complete by: As directed    Resume previous diet   Complete by: As directed       Allergies as of 09/21/2023   No Known Allergies      Medication List     STOP taking these medications    metFORMIN 1000 MG tablet Commonly known as: GLUCOPHAGE       TAKE these medications    Allergy Relief 10 MG tablet Generic drug: loratadine Take 10 mg by mouth daily.   amLODipine 5 MG tablet Commonly known as: NORVASC Take 1 tablet by mouth once daily   aspirin EC 81 MG tablet Take 1 tablet (81 mg total) by mouth daily. Swallow whole.   atorvastatin 40 MG tablet Commonly known as: LIPITOR Take 1 tablet by mouth once daily   cyanocobalamin 2000 MCG tablet Take 1 tablet (2,000 mcg total) by mouth daily.   Iron 325 (65 Fe) MG Tabs Take 1 tablet by mouth once daily   memantine 10 MG tablet Commonly known as: NAMENDA Take 1 tablet twice a day   multivitamin with minerals Tabs tablet Take 1 tablet by mouth daily.   ONE TOUCH ULTRA 2 w/Device Kit 1 kit by Does  not apply route daily.   OneTouch Ultra test strip Generic drug: glucose blood USE 1 STRIP TO CHECK GLUCOSE ONCE DAILY   onetouch ultrasoft lancets Check blood sugars once daily. Dx: E11.51         Signed:  Luanna Salk. Randie Heinz, MD [facility] Office: [phone] Pager: [phone]   10/07/2023, 7:19 AM

## 2023-10-19 ENCOUNTER — Encounter: Payer: Self-pay | Admitting: Family Medicine

## 2023-10-19 ENCOUNTER — Other Ambulatory Visit: Payer: Self-pay | Admitting: Family Medicine

## 2023-10-19 ENCOUNTER — Ambulatory Visit: Payer: Medicare HMO | Admitting: Family Medicine

## 2023-10-19 VITALS — BP 138/68 | HR 103 | Temp 97.5°F | Ht 67.0 in | Wt 132.6 lb

## 2023-10-19 DIAGNOSIS — R079 Chest pain, unspecified: Secondary | ICD-10-CM

## 2023-10-19 DIAGNOSIS — Z9889 Other specified postprocedural states: Secondary | ICD-10-CM | POA: Diagnosis not present

## 2023-10-19 DIAGNOSIS — Z8679 Personal history of other diseases of the circulatory system: Secondary | ICD-10-CM

## 2023-10-19 DIAGNOSIS — D509 Iron deficiency anemia, unspecified: Secondary | ICD-10-CM | POA: Diagnosis not present

## 2023-10-19 DIAGNOSIS — R739 Hyperglycemia, unspecified: Secondary | ICD-10-CM

## 2023-10-19 DIAGNOSIS — R0789 Other chest pain: Secondary | ICD-10-CM

## 2023-10-19 LAB — CBC WITH DIFFERENTIAL/PLATELET
Basophils Absolute: 0 10*3/uL (ref 0.0–0.1)
Basophils Relative: 0.2 % (ref 0.0–3.0)
Eosinophils Absolute: 0.2 10*3/uL (ref 0.0–0.7)
Eosinophils Relative: 2.2 % (ref 0.0–5.0)
HCT: 35.6 % — ABNORMAL LOW (ref 39.0–52.0)
Hemoglobin: 11.7 g/dL — ABNORMAL LOW (ref 13.0–17.0)
Lymphocytes Relative: 11.8 % — ABNORMAL LOW (ref 12.0–46.0)
Lymphs Abs: 1.3 10*3/uL (ref 0.7–4.0)
MCHC: 33 g/dL (ref 30.0–36.0)
MCV: 89.5 fL (ref 78.0–100.0)
Monocytes Absolute: 0.5 10*3/uL (ref 0.1–1.0)
Monocytes Relative: 4.7 % (ref 3.0–12.0)
Neutro Abs: 8.8 10*3/uL — ABNORMAL HIGH (ref 1.4–7.7)
Neutrophils Relative %: 81.1 % — ABNORMAL HIGH (ref 43.0–77.0)
Platelets: 218 10*3/uL (ref 150.0–400.0)
RBC: 3.98 Mil/uL — ABNORMAL LOW (ref 4.22–5.81)
RDW: 15.5 % (ref 11.5–15.5)
WBC: 10.9 10*3/uL — ABNORMAL HIGH (ref 4.0–10.5)

## 2023-10-19 LAB — HEMOGLOBIN A1C: Hgb A1c MFr Bld: 6 % (ref 4.6–6.5)

## 2023-10-19 LAB — COMPREHENSIVE METABOLIC PANEL
ALT: 13 U/L (ref 0–53)
AST: 22 U/L (ref 0–37)
Albumin: 3.9 g/dL (ref 3.5–5.2)
Alkaline Phosphatase: 98 U/L (ref 39–117)
BUN: 26 mg/dL — ABNORMAL HIGH (ref 6–23)
CO2: 27 meq/L (ref 19–32)
Calcium: 9.2 mg/dL (ref 8.4–10.5)
Chloride: 106 meq/L (ref 96–112)
Creatinine, Ser: 1.24 mg/dL (ref 0.40–1.50)
GFR: 55.7 mL/min — ABNORMAL LOW (ref >60.00)
Glucose, Bld: 74 mg/dL (ref 70–99)
Potassium: 4.1 meq/L (ref 3.5–5.1)
Sodium: 144 meq/L (ref 135–145)
Total Bilirubin: 0.4 mg/dL (ref 0.2–1.2)
Total Protein: 7.2 g/dL (ref 6.0–8.3)

## 2023-10-19 MED ORDER — IRON 325 (65 FE) MG PO TABS: 1.0000 | ORAL_TABLET | Freq: Every day | ORAL | 1 refills | Status: AC

## 2023-10-19 MED ORDER — PANTOPRAZOLE SODIUM 40 MG PO TBEC: 40.0000 mg | DELAYED_RELEASE_TABLET | Freq: Two times a day (BID) | ORAL | 1 refills | Status: DC

## 2023-10-19 MED ORDER — LOSARTAN POTASSIUM 25 MG PO TABS: 25.0000 mg | ORAL_TABLET | Freq: Every day | ORAL | 0 refills | Status: DC

## 2023-10-19 MED ORDER — IRON 325 (65 FE) MG PO TABS: 1.0000 | ORAL_TABLET | Freq: Every day | ORAL | 1 refills | Status: DC

## 2023-10-19 MED ORDER — LOSARTAN POTASSIUM 25 MG PO TABS: 25.0000 mg | ORAL_TABLET | Freq: Every day | ORAL | 3 refills | Status: DC

## 2023-10-19 MED ORDER — PANTOPRAZOLE SODIUM 40 MG PO TBEC: 40.0000 mg | DELAYED_RELEASE_TABLET | Freq: Two times a day (BID) | ORAL | 0 refills | Status: DC

## 2023-10-19 NOTE — Progress Notes (Signed)
Established Patient Office Visit  Subjective   Patient ID: Walter [name], male    DOB: 1946/10/24  Age: 77 y.o. MRN: [id number]  Chief Complaint  Patient presents with   Hospitalization Follow-up    HPI   Walter [name] is seen for follow-up regarding recent lengthy hospitalization for abdominal aortic aneurysm repair.  Has multiple chronic problems including hyperlipidemia, CAD, GERD, hyperglycemia, hypertension, arteriovenous malformation of small bowel.  Also has history of Parkinson's disease.  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  Severely deconditioned.  Did managed to gain some weight between then and time of surgery.  Had preoperative cardiac clearance with stress testing prior to surgery.  Went to surgery on 09-16-2023 and underwent endovascular repair abdominal aortic aneurysm with left common iliac to left common femoral artery bypass and stent placement to the left external iliac artery.  Was transfused initially 3 units packed red blood cells.  Transferred to intensive care on peripheral vasopressors and had acute kidney injury.  Transfused additional blood in ICU.  Was eventually weaned off vasopressors and transferred to stepdown on 11-18.  His creatinine continued to improve.  He was admitted to rehab on 09-21-2023 for PT, speech therapy, and OT.  Discharge hemoglobin is 9.9.  His discharge weight was apparently 122 pounds and back up to 132 pounds today.  He is using Glucerna supplement.  Discharged home December 3.  Has home PT 2 times per week.  Wife states he does not have a lot of motivation to do exercises other than when PT is there.  He was discharged on losartan 25 mg daily.  His med list included amlodipine but they have not been giving that and blood pressures been stable.  Remains on Protonix 40 mg daily needs refills.  Needs follow-up labs today including hemoglobin and CMP.  He did have  postoperative CT scan.  This showed persistent mural thickening of the gastroesophageal junction with evidence of small hiatal hernia.  Question was whether this represented sequelae of esophagitis/gastritis versus neoplasm.  He did undergo small bowel enteroscopy on 11-27 which showed normal esophagus.  3 cm hiatal hernia.  7 angioectasias found the duodenal bulb.  Also had a couple of angioectasias with no bleeding at the proximal jejunum.  It was felt that his anemia was a combination of recent surgery and hematoma that developed following surgery along with anemia of chronic disease and possibly some small bowel angioectasias.  When he was nearing end of his stay in rehab he developed acute chest pain 1 day and was taken for EKG immediately which did not show any acute findings.  He actually developed very similar pain today in office which was described as sharp and left-sided.  No diaphoresis.  No nausea.  No radiation to the arm or neck.  No dyspnea.  This sharp pain has been somewhat fleeting.  Past Medical History:  Diagnosis Date   Allergy    Cancer [facility])    Skin cancer   Cataract    Coronary artery disease    Diabetes mellitus    Diverticulosis    Esophageal stricture    GERD (gastroesophageal reflux disease)    History of hiatal hernia    Hyperlipidemia    Hypertension    Peripheral vascular disease (HCC)    Past Surgical History:  Procedure Laterality Date   ABDOMINAL AORTIC ENDOVASCULAR STENT GRAFT N/A 09/16/2023  Procedure: ABDOMINAL AORTA ANEURYSM STENT USING COOK 32 X STENT AND ENDO-ANCHORS X 7;  Surgeon: Maeola Harman, MD;  Location: [facility] OR;  Service: Vascular;  Laterality: N/A;   ANEURYSM COILING Right 09/16/2023   Procedure: COILING TO RIGHT COMMON ILIAC;  Surgeon: Maeola Harman, MD;  Location: [facility] OR;  Service: Vascular;  Laterality: Right;   BIOPSY  03/07/2023   Procedure: BIOPSY;  Surgeon: Beverley Fiedler, MD;  Location: [facility] ENDOSCOPY;   Service: Gastroenterology;;   Ethelene Hal GRAFT FEMORAL-PERONEAL Left 09/16/2023   Procedure: LEFT EXTERNAL ILIAC TO COMMON FEMORAL BYPASS USING HEMASHIELD GOLD GRAFT;  Surgeon: Maeola Harman, MD;  Location: [facility] OR;  Service: Vascular;  Laterality: Left;   CATARACT EXTRACTION Bilateral    COLONOSCOPY WITH PROPOFOL N/A 03/07/2023   Procedure: COLONOSCOPY WITH PROPOFOL;  Surgeon: Beverley Fiedler, MD;  Location: [facility] ENDOSCOPY;  Service: Gastroenterology;  Laterality: N/A;   CORONARY ANGIOPLASTY WITH STENT PLACEMENT  2004   ENDARTERECTOMY Left 09/16/2023   Procedure: LEFT ILIAC ENDARTERECTOMY;  Surgeon: Maeola Harman, MD;  Location: [facility] OR;  Service: Vascular;  Laterality: Left;   ENDARTERECTOMY FEMORAL Bilateral 09/16/2023   Procedure: BILATERAL FEMORAL ENDARTERECTOMY;  Surgeon: Maeola Harman, MD;  Location: [facility] OR;  Service: Vascular;  Laterality: Bilateral;   ENTEROSCOPY N/A 09/28/2023   Procedure: ENTEROSCOPY;  Surgeon: Imogene Burn, MD;  Location: [facility] ENDOSCOPY;  Service: Gastroenterology;  Laterality: N/A;   ESOPHAGOGASTRODUODENOSCOPY  06/07/2012   Procedure: ESOPHAGOGASTRODUODENOSCOPY (EGD);  Surgeon: Hart Carwin, MD;  Location: [facility] ENDOSCOPY;  Service: Endoscopy;  Laterality: N/A;   ESOPHAGOGASTRODUODENOSCOPY (EGD) WITH PROPOFOL N/A 03/07/2023   Procedure: ESOPHAGOGASTRODUODENOSCOPY (EGD) WITH PROPOFOL;  Surgeon: Beverley Fiedler, MD;  Location: [facility] ENDOSCOPY;  Service: Gastroenterology;  Laterality: N/A;   FEMORAL-FEMORAL BYPASS GRAFT Bilateral 09/16/2023   Procedure: LEFT TO RIGHT BYPASS GRAFT FEMORAL-FEMORAL ARTERY USING HEMSHIELD GOLD GRAFT;  Surgeon: Maeola Harman, MD;  Location: [facility] OR;  Service: Vascular;  Laterality: Bilateral;   HOT HEMOSTASIS N/A 03/07/2023   Procedure: HOT HEMOSTASIS (ARGON PLASMA COAGULATION/BICAP);  Surgeon: Beverley Fiedler, MD;  Location: [facility] ENDOSCOPY;  Service: Gastroenterology;  Laterality: N/A;   HOT HEMOSTASIS N/A  09/28/2023   Procedure: HOT HEMOSTASIS (ARGON PLASMA COAGULATION;  Surgeon: Imogene Burn, MD;  Location: [facility] ENDOSCOPY;  Service: Gastroenterology;  Laterality: N/A;   IR KYPHO LUMBAR INC FX REDUCE BONE BX UNI/BIL CANNULATION INC/IMAGING  03/02/2023   IVC FILTER INSERTION N/A 03/09/2023   Procedure: IVC FILTER INSERTION;  Surgeon: Victorino Sparrow, MD;  Location: [facility] INVASIVE CV LAB;  Service: Cardiovascular;  Laterality: N/A;   POLYPECTOMY  03/07/2023   Procedure: POLYPECTOMY;  Surgeon: Beverley Fiedler, MD;  Location: [facility] ENDOSCOPY;  Service: Gastroenterology;;   SHOULDER SURGERY  11/01/2004    reports that he quit smoking about 23 years ago. His smoking use included cigarettes. He has never used smokeless tobacco. He reports that he does not drink alcohol and does not use drugs. family history includes Breast cancer in his mother; Healthy in his son and son; Parkinson's disease in his sister. No Known Allergies  Review of Systems  Constitutional:  Negative for chills and fever.  Respiratory:  Negative for cough.   Cardiovascular:  Negative for chest pain.  Gastrointestinal:  Negative for abdominal pain, nausea and vomiting.  Genitourinary:  Negative for dysuria.      Objective:     BP 138/68 (BP Location: Left Arm, Patient Position: Sitting, Cuff Size: Normal)  Pulse (!) 103   Temp (!) 97.5 F (36.4 C) (Oral)   Ht 5\' 7"  (1.702 m)   Wt 132 lb 9.6 oz (60.1 kg)   SpO2 95%   BMI 20.77 kg/m  BP Readings from Last 3 Encounters:  10/19/23 138/68  10/04/23 (!) 148/69  09/21/23 (!) 157/76   Wt Readings from Last 3 Encounters:  10/19/23 132 lb 9.6 oz (60.1 kg)  09/28/23 141 lb (64 kg)  09/18/23 146 lb 13.2 oz (66.6 kg)      Physical Exam Vitals reviewed.  Constitutional:      Appearance: He is well-developed.  HENT:     Right Ear: External ear normal.     Left Ear: External ear normal.  Eyes:     Pupils: Pupils are equal, round, and reactive to light.  Neck:     Thyroid:  No thyromegaly.  Cardiovascular:     Rate and Rhythm: Normal rate and regular rhythm.  Pulmonary:     Effort: Pulmonary effort is normal. No respiratory distress.     Breath sounds: Normal breath sounds. No wheezing or rales.  Abdominal:     Comments: Wounds from his previous surgery are healing well.  A few small eschars.  No erythema.  Nontender.  Musculoskeletal:     Cervical back: Neck supple.  Skin:    Comments: Conjunctive and nailbeds are slightly pale.  Neurological:     Mental Status: He is alert and oriented to person, place, and time.      No results found for any visits on 10/19/23.  Last CBC Lab Results  Component Value Date   WBC 7.1 10/02/2023   HGB 9.9 (L) 10/02/2023   HCT 31.6 (L) 10/02/2023   MCV 91.1 10/02/2023   MCH 28.5 10/02/2023   RDW 14.6 10/02/2023   PLT 243 10/02/2023   Last metabolic panel Lab Results  Component Value Date   GLUCOSE 99 10/03/2023   NA 142 10/03/2023   K 3.7 10/03/2023   CL 106 10/03/2023   CO2 26 10/03/2023   BUN 19 10/03/2023   CREATININE 1.24 10/03/2023   GFRNONAA 60 (L) 10/03/2023   CALCIUM 8.8 (L) 10/03/2023   PHOS 3.6 02/22/2023   PROT 4.6 (L) 09/22/2023   ALBUMIN 1.9 (L) 09/22/2023   BILITOT 0.8 09/22/2023   ALKPHOS 80 09/22/2023   AST 35 09/22/2023   ALT 31 09/22/2023   ANIONGAP 10 10/03/2023   Last lipids Lab Results  Component Value Date   CHOL 75 09/17/2023   HDL 26 (L) 09/17/2023   LDLCALC 31 09/17/2023   TRIG 92 09/17/2023   CHOLHDL 2.9 09/17/2023   Last hemoglobin A1c Lab Results  Component Value Date   HGBA1C 5.7 (H) 09/09/2023   Last thyroid functions Lab Results  Component Value Date   TSH 0.65 02/16/2023   Last vitamin B12 and Folate Lab Results  Component Value Date   VITAMINB12 678 02/23/2023   FOLATE 13.2 02/23/2023      The ASCVD Risk score (Arnett DK, et al., 2019) failed to calculate for the following reasons:   The valid total cholesterol range is 130 to 320 mg/dL     Assessment & Plan:   #1 status post recent abdominal aortic aneurysm repair.  He had complications of acute blood loss anemia and had significant deconditioning and generalized weakness requiring rehab stay.  Currently has home physical therapy 2 times per week.  He is making some recovery as weight is up 10 pounds from discharge.  #  2 anemia likely multifactorial including history of anemia of chronic disease, history of small bowel AVMs, hematoma following surgery, and blood loss from surgery.  Discharge hemoglobin 9.9.  Recheck CBC today along with iron studies.  Continue iron replacement with refill given  #3 hypertension currently stable on losartan 25 mg daily.  He has amlodipine on discharge list but has not been taking this.  Will continue to hold for now as long as blood pressure remained stable.  They will monitor periodically.  #4 history of hyperglycemia.  Check A1c today with labs  #5 history of acute kidney injury during recent hospitalization.  This improved during his hospitalization.  Will check follow-up CMP today.  #6 General Deconditioning.  Continue PT.  We gave him some Thera-Band and suggestion for some upper extremity exercises.  He is encouraged to exercise daily including days he is not getting formal PT  #7 mild hypocalcemia.  This is probably related to low albumin but will recheck CMP today and corrected for albumin  #8 atypical chest pain.  In office today developed some sharp pain left substernal region.  No diaphoresis.  No dyspnea.  No nausea or vomiting.  EKG shows sinus rhythm with no acute ST-T changes.  Pain lasted briefly only a few minutes and then resolved.  Very atypical and associated with increased belching.  Continue Protonix.  Recommend cardiology evaluation if he continues to have ongoing chest symptoms suspect noncardiac etiology  Over 40 minutes were spent in face-to-face encounter in combination with reviewing recent hospitalization and rehab notes,  and documentation   No follow-ups on file.    Evelena Peat, MD

## 2023-10-20 LAB — IRON,TIBC AND FERRITIN PANEL
%SAT: 13 % — ABNORMAL LOW (ref 20–48)
Ferritin: 70 ng/mL (ref 24–380)
Iron: 33 ug/dL — ABNORMAL LOW (ref 50–180)
TIBC: 248 ug/dL — ABNORMAL LOW (ref 250–425)

## 2023-10-27 ENCOUNTER — Telehealth: Payer: Self-pay

## 2023-10-27 NOTE — Telephone Encounter (Signed)
Patient's wife called in to let the office know that the patient needed to be scheduled for his post op visit and to tell the doctor that there was one spot on the left lower abdominal incision that was raised and hard  Reviewed pt's chart, returned call for clarification, two identifiers used.I spoke with the wife who informed this Clinical research associate that the visiting nurse was there and had looked at the incision and that it looks good per that nurse. She stated that the area is not red, warm to touch or shows no signs of infection. The area is healing well. It is tender to touch but nothing abnormal. Patient has not had any fever nor has had any severe pain in this area. I told them to just watch the area and if there are any concerns to call the office. I scheduled him with the PA in 2 weeks for a follow up/post op incision check appointment.

## 2023-11-03 ENCOUNTER — Telehealth: Payer: Self-pay

## 2023-11-03 NOTE — Telephone Encounter (Signed)
 Copied from CRM [id number]. Topic: Clinical - Medical Advice >> Oct 31, 2023  1:57 PM Elizebeth Brooking wrote: Reason for CRM: Patient wife is stating they both have COVID, and wants Dr.Burchette or Nurses to call her back

## 2023-11-08 NOTE — Telephone Encounter (Signed)
 I spoke with the patient's wife Rhunette Croft and she reported the patient is currently feeling better and nothing further needed at this time.

## 2023-11-16 ENCOUNTER — Ambulatory Visit (INDEPENDENT_AMBULATORY_CARE_PROVIDER_SITE_OTHER): Payer: Medicare HMO | Admitting: Physician Assistant

## 2023-11-16 VITALS — BP 155/78 | Temp 90.0°F | Resp 20 | Ht 67.0 in | Wt 132.0 lb

## 2023-11-16 DIAGNOSIS — I714 Abdominal aortic aneurysm, without rupture, unspecified: Secondary | ICD-10-CM

## 2023-11-16 NOTE — Progress Notes (Signed)
 POST OPERATIVE OFFICE NOTE    CC:  F/u for surgery  HPI:  This is a 78 y.o. male who is s/p repair of abdominal aortic aneurysm with aorto uni limb stents involving Endo anchor placement proximally, left external iliac artery stenting, left common iliac to common femoral bypass, right common iliac artery coiling, and left to right femorofemoral bypass with right common femoral artery endarterectomy by Dr. Vikki Graves on 09/16/2023.  CTA performed 10 days later was negative for endoleak.  CTA was obtained due to suspected GI bleed.  He underwent workup with endoscopy however this was negative for any bleeding source.  He denies rest pain or tissue loss bilateral lower extremities.  He states his incisions have healed well.  He continues to feel weak despite completing his home health PT regimen.  He is taking an aspirin  and statin daily.  His wife is present today who plans to call GI due to ongoing weakness to see if they can repeat lab work.  No Known Allergies  Current Outpatient Medications  Medication Sig Dispense Refill   acetaminophen  (TYLENOL ) 325 MG tablet Take 1-2 tablets (325-650 mg total) by mouth every 4 (four) hours as needed for mild pain (pain score 1-3).     amLODipine  (NORVASC ) 5 MG tablet Take 1 tablet by mouth once daily 90 tablet 1   aspirin  EC 81 MG tablet Take 1 tablet (81 mg total) by mouth daily. Swallow whole.     atorvastatin  (LIPITOR) 40 MG tablet Take 1 tablet by mouth once daily 90 tablet 0   Blood Glucose Monitoring Suppl (ONE TOUCH ULTRA 2) w/Device KIT 1 kit by Does not apply route daily. 1 kit 0   calcium  carbonate (TUMS - DOSED IN MG ELEMENTAL CALCIUM ) 500 MG chewable tablet Chew 2 tablets (400 mg of elemental calcium  total) by mouth 2 (two) times daily with a meal. 150 tablet 0   cyanocobalamin  2000 MCG tablet Take 1 tablet (2,000 mcg total) by mouth daily. 30 tablet 0   docusate sodium  (COLACE) 100 MG capsule Take 1 capsule (100 mg total) by mouth daily.     Ferrous  Sulfate (IRON ) 325 (65 Fe) MG TABS Take 1 tablet (325 mg total) by mouth daily. 90 tablet 1   Lancets (ONETOUCH ULTRASOFT) lancets Check blood sugars once daily. Dx: E11.51 100 each 12   losartan  (COZAAR ) 25 MG tablet Take 1 tablet (25 mg total) by mouth at bedtime. 90 tablet 3   memantine  (NAMENDA ) 10 MG tablet Take 1 tablet twice a day 180 tablet 3   metFORMIN  (GLUCOPHAGE ) 500 MG tablet Take 1 tablet (500 mg total) by mouth 2 (two) times daily with a meal. 60 tablet 0   Multiple Vitamin (MULTIVITAMIN WITH MINERALS) TABS tablet Take 1 tablet by mouth daily.     ONETOUCH ULTRA test strip USE 1 STRIP TO CHECK GLUCOSE ONCE DAILY 100 each 2   pantoprazole  (PROTONIX ) 40 MG tablet Take 1 tablet (40 mg total) by mouth 2 (two) times daily. 90 tablet 1   loratadine (ALLERGY RELIEF) 10 MG tablet Take 10 mg by mouth daily. (Patient not taking: Reported on 11/16/2023)     No current facility-administered medications for this visit.     ROS:  See HPI  Physical Exam:  Vitals:   11/16/23 1255  BP: (!) 155/78  Resp: 20  Temp: (!) 90 F (32.2 C)  TempSrc: Temporal  SpO2: 94%  Weight: 132 lb (59.9 kg)  Height: 5\' 7"  (1.702 m)  Incision:  L RP incision well-healed; groin incisions well-healed Extremities: Palpable femorofemoral bypass pulse; palpable DP pulses bilaterally Neuro: A&O Abdomen:  soft, NT, ND  Assessment/Plan:  This is a 78 y.o. male who is s/p: Repair of abdominal aortic aneurysm with aorto uni limb stent graft complicated by need for left common iliac to common femoral bypass and left to right femoral to femoral bypass  Bilateral lower extremities are well-perfused with palpable DP pulses.  All incisions have completely healed.  CTA was performed 10 days after surgery which was negative for endoleak.  Case was discussed with Dr. Vikki Graves who recommended EVAR duplex in 6 months.  Patient continues to feel weak despite completing home health PT regimen.  Patient's wife plans to notify  GI of ongoing weakness and will request repeat lab work due to ongoing anemia workup.   Cordie Deters, PA-C [facility] [phone]  Clinic MD:  Vikki Graves

## 2023-11-18 ENCOUNTER — Ambulatory Visit: Payer: Self-pay | Admitting: Family Medicine

## 2023-11-18 ENCOUNTER — Encounter: Payer: Self-pay | Admitting: Family Medicine

## 2023-11-18 ENCOUNTER — Ambulatory Visit (INDEPENDENT_AMBULATORY_CARE_PROVIDER_SITE_OTHER): Payer: Medicare HMO | Admitting: Family Medicine

## 2023-11-18 VITALS — BP 162/80 | HR 103 | Ht 67.0 in | Wt 128.0 lb

## 2023-11-18 DIAGNOSIS — R310 Gross hematuria: Secondary | ICD-10-CM | POA: Insufficient documentation

## 2023-11-18 DIAGNOSIS — R319 Hematuria, unspecified: Secondary | ICD-10-CM | POA: Diagnosis not present

## 2023-11-18 NOTE — Patient Instructions (Addendum)
I appreciate the opportunity to provide care to you today!    Follow up:  pcp  Labs: please stop by the lab today to get your blood drawn (CBC, BMP)  Blood in the Urine (Gross Hematuria) Please go to the emergency department right away if you experience any of the following along with blood in your urine:  Blood clots in your urine. Belly pain. Not being able to urinate. Fast heartbeat. Pale skin. Low blood pressure. Trouble breathing. Chest pain. Feeling lightheaded or dizzy. Severe pain in your side or back, especially if you also have a fever.  Referrals Today: You are being referred to a urologist for a cystoscopy, a procedure to look inside your bladder and urinary tract. This is to check for any potential issues, including ruling out the possibility of bladder or urinary tract cancer.   Attached with your AVS, you will find valuable resources for self-education. I highly recommend dedicating some time to thoroughly examine them.   Please continue to a heart-healthy diet and increase your physical activities. Try to exercise for at least five days a week.    It was a pleasure to see you and I look forward to continuing to work together on your health and well-being. Please do not hesitate to call the office if you need care or have questions about your care.  In case of emergency, please visit the Emergency Department for urgent care, or contact our clinic at [phone] to schedule an appointment. We're here to help you!   Have a wonderful day and week. With Gratitude, Gilmore Laroche MSN, FNP-BC

## 2023-11-18 NOTE — Progress Notes (Signed)
Acute Office Visit  Subjective:    Patient ID: Walter [name], male    DOB: 1946/04/03, 78 y.o.   MRN: [id number]  Chief Complaint  Patient presents with   Hematuria    Patient complains of hematuria starting this morning, with burning. Is not able to get a urine sample currently.     HPI The patient is in today with complaints of gross hematuria, first noticed this morning on 11/18/23 at 09:30.The patient is accompanied by his wife, who reports seeing a quarter-sized red spot in his diaper, blood on wiping his penis, and two drops of blood while attempting to urinate in the toilet. The patient denies symptoms of blood clots, abdominal pain, flank pain, or fever, but does report slight discomfort with urination.No rash, joint pain, or unintentional weight loss is reported.    Past Medical History:  Diagnosis Date   Allergy    Cancer [facility])    Skin cancer   Cataract    Coronary artery disease    Diabetes mellitus    Diverticulosis    Esophageal stricture    GERD (gastroesophageal reflux disease)    History of hiatal hernia    Hyperlipidemia    Hypertension    Peripheral arterial disease (HCC)    Peripheral vascular disease (HCC)     Past Surgical History:  Procedure Laterality Date   ABDOMINAL AORTIC ENDOVASCULAR STENT GRAFT N/A 09/16/2023   Procedure: ABDOMINAL AORTA ANEURYSM STENT USING COOK 32 X STENT AND ENDO-ANCHORS X 7;  Surgeon: Maeola Harman, MD;  Location: [facility] OR;  Service: Vascular;  Laterality: N/A;   ANEURYSM COILING Right 09/16/2023   Procedure: COILING TO RIGHT COMMON ILIAC;  Surgeon: Maeola Harman, MD;  Location: [facility] OR;  Service: Vascular;  Laterality: Right;   BIOPSY  03/07/2023   Procedure: BIOPSY;  Surgeon: Beverley Fiedler, MD;  Location: [facility] ENDOSCOPY;  Service: Gastroenterology;;   BYPASS GRAFT FEMORAL-PERONEAL Left 09/16/2023   Procedure: LEFT EXTERNAL ILIAC TO COMMON FEMORAL BYPASS USING HEMASHIELD GOLD GRAFT;   Surgeon: Maeola Harman, MD;  Location: [facility] OR;  Service: Vascular;  Laterality: Left;   CATARACT EXTRACTION Bilateral    COLONOSCOPY WITH PROPOFOL N/A 03/07/2023   Procedure: COLONOSCOPY WITH PROPOFOL;  Surgeon: Beverley Fiedler, MD;  Location: [facility] ENDOSCOPY;  Service: Gastroenterology;  Laterality: N/A;   CORONARY ANGIOPLASTY WITH STENT PLACEMENT  2004   ENDARTERECTOMY Left 09/16/2023   Procedure: LEFT ILIAC ENDARTERECTOMY;  Surgeon: Maeola Harman, MD;  Location: [facility] OR;  Service: Vascular;  Laterality: Left;   ENDARTERECTOMY FEMORAL Bilateral 09/16/2023   Procedure: BILATERAL FEMORAL ENDARTERECTOMY;  Surgeon: Maeola Harman, MD;  Location: [facility] OR;  Service: Vascular;  Laterality: Bilateral;   ENTEROSCOPY N/A 09/28/2023   Procedure: ENTEROSCOPY;  Surgeon: Imogene Burn, MD;  Location: [facility] ENDOSCOPY;  Service: Gastroenterology;  Laterality: N/A;   ESOPHAGOGASTRODUODENOSCOPY  06/07/2012   Procedure: ESOPHAGOGASTRODUODENOSCOPY (EGD);  Surgeon: Hart Carwin, MD;  Location: [facility] ENDOSCOPY;  Service: Endoscopy;  Laterality: N/A;   ESOPHAGOGASTRODUODENOSCOPY (EGD) WITH PROPOFOL N/A 03/07/2023   Procedure: ESOPHAGOGASTRODUODENOSCOPY (EGD) WITH PROPOFOL;  Surgeon: Beverley Fiedler, MD;  Location: [facility] ENDOSCOPY;  Service: Gastroenterology;  Laterality: N/A;   FEMORAL-FEMORAL BYPASS GRAFT Bilateral 09/16/2023   Procedure: LEFT TO RIGHT BYPASS GRAFT FEMORAL-FEMORAL ARTERY USING HEMSHIELD GOLD GRAFT;  Surgeon: Maeola Harman, MD;  Location: [facility] OR;  Service: Vascular;  Laterality: Bilateral;   HOT HEMOSTASIS N/A 03/07/2023   Procedure: HOT HEMOSTASIS (ARGON PLASMA COAGULATION/BICAP);  Surgeon: Beverley Fiedler, MD;  Location: [facility] ENDOSCOPY;  Service: Gastroenterology;  Laterality: N/A;   HOT HEMOSTASIS N/A 09/28/2023   Procedure: HOT HEMOSTASIS (ARGON PLASMA COAGULATION;  Surgeon: Imogene Burn, MD;  Location: [facility] ENDOSCOPY;  Service: Gastroenterology;  Laterality: N/A;   IR  KYPHO LUMBAR INC FX REDUCE BONE BX UNI/BIL CANNULATION INC/IMAGING  03/02/2023   IVC FILTER INSERTION N/A 03/09/2023   Procedure: IVC FILTER INSERTION;  Surgeon: Victorino Sparrow, MD;  Location: [facility] INVASIVE CV LAB;  Service: Cardiovascular;  Laterality: N/A;   POLYPECTOMY  03/07/2023   Procedure: POLYPECTOMY;  Surgeon: Beverley Fiedler, MD;  Location: [facility] ENDOSCOPY;  Service: Gastroenterology;;   SHOULDER SURGERY  11/01/2004    Family History  Problem Relation Age of Onset   Breast cancer Mother    Healthy Son    Healthy Son    Parkinson's disease Sister    Colon cancer Neg Hx    Esophageal cancer Neg Hx    Rectal cancer Neg Hx    Stomach cancer Neg Hx     Social History   Socioeconomic History   Marital status: Married    Spouse name: Not on file   Number of children: 3   Years of education: Not on file   Highest education level: Not on file  Occupational History   Occupation: Auditor  Tobacco Use   Smoking status: Former    Current packs/day: 0.00    Types: Cigarettes    Quit date: 11/02/1999    Years since quitting: 24.0   Smokeless tobacco: Never  Vaping Use   Vaping status: Never Used  Substance and Sexual Activity   Alcohol use: No   Drug use: No   Sexual activity: Not Currently    Partners: Female  Other Topics Concern   Not on file  Social History Narrative   Daily caffeine       Right handed      Lives with wife in a one story home      Social Drivers of Health   Financial Resource Strain: Low Risk  (04/27/2023)   Overall Financial Resource Strain (CARDIA)    Difficulty of Paying Living Expenses: Not hard at all  Food Insecurity: No Food Insecurity (09/21/2023)   Hunger Vital Sign    Worried About Running Out of Food in the Last Year: Never true    Ran Out of Food in the Last Year: Never true  Transportation Needs: No Transportation Needs (09/21/2023)   PRAPARE - Administrator, Civil Service (Medical): No    Lack of Transportation  (Non-Medical): No  Physical Activity: Insufficiently Active (04/27/2023)   Exercise Vital Sign    Days of Exercise per Week: 3 days    Minutes of Exercise per Session: 30 min  Stress: No Stress Concern Present (04/27/2023)   [employer] of Occupational Health - Occupational Stress Questionnaire    Feeling of Stress : Not at all  Social Connections: Socially Integrated (04/27/2023)   Social Connection and Isolation Panel [NHANES]    Frequency of Communication with Friends and Family: More than three times a week    Frequency of Social Gatherings with Friends and Family: More than three times a week    Attends Religious Services: More than 4 times per year    Active Member of [employer] or Organizations: Yes    Attends Engineer, structural: More than 4 times per year    Marital Status: Married  Catering manager Violence:  Not At Risk (09/21/2023)   Humiliation, Afraid, Rape, and Kick questionnaire    Fear of Current or Ex-Partner: No    Emotionally Abused: No    Physically Abused: No    Sexually Abused: No    Outpatient Medications Prior to Visit  Medication Sig Dispense Refill   acetaminophen (TYLENOL) 325 MG tablet Take 1-2 tablets (325-650 mg total) by mouth every 4 (four) hours as needed for mild pain (pain score 1-3).     amLODipine (NORVASC) 5 MG tablet Take 1 tablet by mouth once daily 90 tablet 1   aspirin EC 81 MG tablet Take 1 tablet (81 mg total) by mouth daily. Swallow whole.     atorvastatin (LIPITOR) 40 MG tablet Take 1 tablet by mouth once daily 90 tablet 0   Blood Glucose Monitoring Suppl (ONE TOUCH ULTRA 2) w/Device KIT 1 kit by Does not apply route daily. 1 kit 0   calcium carbonate (TUMS - DOSED IN MG ELEMENTAL CALCIUM) 500 MG chewable tablet Chew 2 tablets (400 mg of elemental calcium total) by mouth 2 (two) times daily with a meal. 150 tablet 0   cyanocobalamin 2000 MCG tablet Take 1 tablet (2,000 mcg total) by mouth daily. 30 tablet 0   docusate sodium  (COLACE) 100 MG capsule Take 1 capsule (100 mg total) by mouth daily.     Ferrous Sulfate (IRON) 325 (65 Fe) MG TABS Take 1 tablet (325 mg total) by mouth daily. 90 tablet 1   Lancets (ONETOUCH ULTRASOFT) lancets Check blood sugars once daily. Dx: E11.51 100 each 12   loratadine (ALLERGY RELIEF) 10 MG tablet Take 10 mg by mouth daily.     losartan (COZAAR) 100 MG tablet Take 100 mg by mouth daily.     losartan (COZAAR) 25 MG tablet Take 1 tablet (25 mg total) by mouth at bedtime. 90 tablet 3   memantine (NAMENDA) 10 MG tablet Take 1 tablet twice a day 180 tablet 3   metFORMIN (GLUCOPHAGE) 1000 MG tablet Take 1,000 mg by mouth 2 (two) times daily.     metFORMIN (GLUCOPHAGE) 500 MG tablet Take 1 tablet (500 mg total) by mouth 2 (two) times daily with a meal. 60 tablet 0   Multiple Vitamin (MULTIVITAMIN WITH MINERALS) TABS tablet Take 1 tablet by mouth daily.     ONETOUCH ULTRA test strip USE 1 STRIP TO CHECK GLUCOSE ONCE DAILY 100 each 2   pantoprazole (PROTONIX) 40 MG tablet Take 1 tablet (40 mg total) by mouth 2 (two) times daily. 90 tablet 1   No facility-administered medications prior to visit.    No Known Allergies  Review of Systems  Constitutional:  Negative for fatigue and fever.  Eyes:  Negative for visual disturbance.  Respiratory:  Negative for chest tightness and shortness of breath.   Cardiovascular:  Negative for chest pain and palpitations.  Genitourinary:  Positive for hematuria.  Neurological:  Negative for dizziness and headaches.       Objective:    Physical Exam Constitutional:      General: He is not in acute distress.    Appearance: He is not ill-appearing, toxic-appearing or diaphoretic.  HENT:     Head: Normocephalic.     Right Ear: External ear normal.     Left Ear: External ear normal.     Nose: No congestion or rhinorrhea.     Mouth/Throat:     Mouth: Mucous membranes are moist.  Cardiovascular:     Rate and Rhythm: Regular rhythm.  Heart sounds:  No murmur heard. Pulmonary:     Effort: No respiratory distress.     Breath sounds: Normal breath sounds.  Abdominal:     General: There is no distension.     Tenderness: There is no abdominal tenderness. There is no right CVA tenderness or left CVA tenderness.  Neurological:     Mental Status: He is alert.     BP (!) 162/80   Pulse (!) 103   Ht 5\' 7"  (1.702 m)   Wt 128 lb (58.1 kg)   SpO2 96%   BMI 20.05 kg/m  Wt Readings from Last 3 Encounters:  11/18/23 128 lb (58.1 kg)  11/16/23 132 lb (59.9 kg)  10/19/23 132 lb 9.6 oz (60.1 kg)       Assessment & Plan:  Gross hematuria Assessment & Plan: The patient is asymptomatic in the clinic, with no evidence of dizziness, lightheadedness, or fatigue. Urinalysis is negative for leukocytes and nitrates; however, it shows 2+ blood in the urine. A referral has been placed to urology for a cystoscopy to rule out bladder or urinary tract cancer, given the patient's risk factors of being male and over 72 years old. Will check the CBC and BMP to assess kidney function, hemoglobin, and hematocrit levels. The patient is encouraged to follow up in the ED if he experiences hematuria with blood clots, abdominal pain, decreased urine output, tachycardia, low blood pressure, dyspnea, chest pain, lightheadedness, or severe flank pain. Both the patient and his wife verbalized understanding and are aware of the plan of care. The patient is also encouraged to follow up with his PCP as scheduled.   Orders: -     Urinalysis -     BMP8+EGFR -     Ambulatory referral to Urology -     CBC with Differential/Platelet -     Urine Culture  Note: This chart has been completed using Dragon Medical Dictation software, and while attempts have been made to ensure accuracy, certain words and phrases may not be transcribed as intended.    Gilmore Laroche, FNP

## 2023-11-18 NOTE — Telephone Encounter (Signed)
Patient was seen today by Gilmore Laroche, FNP

## 2023-11-18 NOTE — Assessment & Plan Note (Signed)
The patient is asymptomatic in the clinic, with no evidence of dizziness, lightheadedness, or fatigue. Urinalysis is negative for leukocytes and nitrates; however, it shows 2+ blood in the urine. A referral has been placed to urology for a cystoscopy to rule out bladder or urinary tract cancer, given the patient's risk factors of being male and over 79 years old. Will check the CBC and BMP to assess kidney function, hemoglobin, and hematocrit levels. The patient is encouraged to follow up in the ED if he experiences hematuria with blood clots, abdominal pain, decreased urine output, tachycardia, low blood pressure, dyspnea, chest pain, lightheadedness, or severe flank pain. Both the patient and his wife verbalized understanding and are aware of the plan of care. The patient is also encouraged to follow up with his PCP as scheduled.

## 2023-11-18 NOTE — Telephone Encounter (Addendum)
Copied from CRM [id number]. Topic: Clinical - Red Word Triage >> Nov 18, 2023  8:56 AM Shelbie Proctor wrote: Red Word that prompted transfer to Nurse Triage: Patient's spouse Rhunette Croft [phone] states patient is bleeding from his penis started this morning, patient sleeps with depends. Rhunette Croft states patient has been really weak, gas in stomach and wants to speak with a nurse or Dr. Caryl Never. Rhunette Croft denies a fever nor pain.  Chief Complaint: Blood in depends Symptoms: Bleeding, weakness, and gas in stomach Frequency: Overnight Pertinent Negatives: Patient denies pain  Disposition: [] ED /[] Urgent Care (no appt availability in office) / [x] Appointment(In office/virtual)/ []  [facility]/ [] Home Care/ [] Refused Recommended Disposition /[] [facility]/ []  Follow-up with PCP Additional Notes: Spoke to patient's wife this morning on behalf of her husband. Patient woke-up this morning with blood in his depends. Wife stated blood was the amount of a nickel. Wife stated his depends were full of urine. Wife was unable to determine if the blood came from his penis or his urine. Wife stated her husband is also feeling weak. Wife reported that gas is present in his stomach. Wife denied abdominal pain, pain while urinating, and difficulty urinating. Advised patient to see a provider within 24 hours. Wife really wanted an appointment for Monday, as she is taking care of the grandbaby today. Advised that patient should be seen today. Wife complied and allowed me to scheduled an appointment for her husband this morning at a different office. Advised wife to take patient to the ED if symptoms worsen between now and appointment. Wife complied.   Reason for Disposition  Blood in urine (red, pink, or tea-colored)  Answer Assessment - Initial Assessment Questions 1. SYMPTOM: "What's the main symptom you're concerned about?" (e.g., discharge from penis, rash, pain, itching, swelling)     Blood in depends  over night, unable to determine if blood is coming from penis or urine  2. LOCATION: "Where is the blood located?"     Penis/depends  3. ONSET: "When did bleeding  start?"     Overnight  4. PAIN: "Is there any pain?" If Yes, ask: "How bad is it?"  (Scale 1-10; or mild, moderate, severe)     Denies, but states weakness is present  5. URINE: "Any difficulty passing urine?" If Yes, ask: "When was the last time?"     Denies, wife states husbands depends were full when he woke up this morning  6. CAUSE: "What do you think is causing the symptoms?"     Denies  7. OTHER SYMPTOMS: "Do you have any other symptoms?" (e.g., fever, abdomen pain, blood in urine)     Weakness, gas in stomach  Protocols used: Penis and Scrotum Symptoms-A-AH

## 2023-11-19 LAB — CBC WITH DIFFERENTIAL/PLATELET
Basophils Absolute: 0 10*3/uL (ref 0.0–0.2)
Basos: 0 %
EOS (ABSOLUTE): 0.1 10*3/uL (ref 0.0–0.4)
Eos: 2 %
Hematocrit: 33.7 % — ABNORMAL LOW (ref 37.5–51.0)
Hemoglobin: 10.5 g/dL — ABNORMAL LOW (ref 13.0–17.7)
Immature Grans (Abs): 0 10*3/uL (ref 0.0–0.1)
Immature Granulocytes: 0 %
Lymphocytes Absolute: 0.8 10*3/uL (ref 0.7–3.1)
Lymphs: 11 %
MCH: 28.4 pg (ref 26.6–33.0)
MCHC: 31.2 g/dL — ABNORMAL LOW (ref 31.5–35.7)
MCV: 91 fL (ref 79–97)
Monocytes Absolute: 0.4 10*3/uL (ref 0.1–0.9)
Monocytes: 5 %
Neutrophils Absolute: 5.7 10*3/uL (ref 1.4–7.0)
Neutrophils: 82 %
Platelets: 221 10*3/uL (ref 150–450)
RBC: 3.7 x10E6/uL — ABNORMAL LOW (ref 4.14–5.80)
RDW: 13.8 % (ref 11.6–15.4)
WBC: 7.1 10*3/uL (ref 3.4–10.8)

## 2023-11-19 LAB — BMP8+EGFR
BUN/Creatinine Ratio: 21 (ref 10–24)
BUN: 26 mg/dL (ref 8–27)
CO2: 22 mmol/L (ref 20–29)
Calcium: 9.4 mg/dL (ref 8.6–10.2)
Chloride: 101 mmol/L (ref 96–106)
Creatinine, Ser: 1.26 mg/dL (ref 0.76–1.27)
Glucose: 197 mg/dL — ABNORMAL HIGH (ref 70–99)
Potassium: 4.3 mmol/L (ref 3.5–5.2)
Sodium: 143 mmol/L (ref 134–144)
eGFR: 58 mL/min/{1.73_m2} — ABNORMAL LOW (ref >59)

## 2023-11-19 NOTE — Progress Notes (Signed)
Please inform the patient that his kidney function is stable, and his hemoglobin and hematocrit levels appear to be at baseline.

## 2023-11-21 LAB — URINALYSIS
Glucose, UA: NEGATIVE
Leukocytes,UA: NEGATIVE
Nitrite, UA: NEGATIVE
Specific Gravity, UA: 1.015 (ref 1.005–1.030)
Urobilinogen, Ur: 0.2 mg/dL (ref 0.2–1.0)
pH, UA: 6 (ref 5.0–7.5)

## 2023-11-22 ENCOUNTER — Encounter: Payer: Self-pay | Admitting: Family Medicine

## 2023-11-23 LAB — URINE CULTURE

## 2023-11-23 NOTE — Telephone Encounter (Unsigned)
Copied from CRM [id number]. Topic: Clinical - Lab/Test Results >> Nov 22, 2023  2:57 PM Geroge Baseman wrote: Reason for CRM: Patients wife calling in to get information on his recent lab results. Would like a call back as soon as you can please.

## 2023-11-25 ENCOUNTER — Telehealth: Payer: Self-pay

## 2023-11-25 NOTE — Telephone Encounter (Signed)
Patient's wife Rhunette Croft informed of results from Gilmore Laroche, FNP from labs on 11/17/1013

## 2023-11-25 NOTE — Telephone Encounter (Signed)
Copied from CRM [id number]. Topic: Clinical - Lab/Test Results >> Nov 25, 2023  1:36 PM Orinda Kenner C wrote: Reason for CRM: Patient's wife Rhunette Croft [phone] wants patient test results. Please advise and call back.

## 2023-12-13 ENCOUNTER — Other Ambulatory Visit: Payer: Self-pay

## 2023-12-13 DIAGNOSIS — I714 Abdominal aortic aneurysm, without rupture, unspecified: Secondary | ICD-10-CM

## 2023-12-15 ENCOUNTER — Other Ambulatory Visit: Payer: Self-pay | Admitting: Family Medicine

## 2023-12-15 DIAGNOSIS — E1151 Type 2 diabetes mellitus with diabetic peripheral angiopathy without gangrene: Secondary | ICD-10-CM

## 2023-12-26 ENCOUNTER — Telehealth: Payer: Self-pay

## 2023-12-26 NOTE — Telephone Encounter (Signed)
 Advice:  -pt's spouse called stating her husband is weak, that he had surgery by Dr. Randie Heinz. -reviewed chart, returned call to [facility], pt's spouse who states he is not getting any better.  She does PT with him and he just gives out afterwards, she is not able to articulate what is exactly wrong, just knows he is weak, after Dr. Darcella Cheshire surgery that he is not getting stronger, and his heart is beating fast - near 100 (because she keeps track of it).  He doesn't have a fever either. She states he has Parkinson's, that she didn't know who to call, not knowing if she should call his regular doctor or Dr. Randie Heinz.    -if he was able to see his PCP, he could get labs and checked out to see if has anything like a urinary problem, dehydrated, infection, etc.   We would be glad to see him but not sure vascular can help if he is not having a vascular problem.   -Spouse confirms understanding that if her husband's legs become cool, painful, discolored to take him to the ED.  That if she was concerned and she needed immediate assistance to call 911.

## 2024-01-02 ENCOUNTER — Ambulatory Visit (INDEPENDENT_AMBULATORY_CARE_PROVIDER_SITE_OTHER)

## 2024-01-02 ENCOUNTER — Ambulatory Visit (INDEPENDENT_AMBULATORY_CARE_PROVIDER_SITE_OTHER): Payer: Medicare HMO | Admitting: Family Medicine

## 2024-01-02 ENCOUNTER — Encounter: Payer: Self-pay | Admitting: Family Medicine

## 2024-01-02 VITALS — BP 150/72 | HR 105 | Temp 97.7°F | Wt 123.3 lb

## 2024-01-02 DIAGNOSIS — Z7984 Long term (current) use of oral hypoglycemic drugs: Secondary | ICD-10-CM

## 2024-01-02 DIAGNOSIS — R296 Repeated falls: Secondary | ICD-10-CM

## 2024-01-02 DIAGNOSIS — R5383 Other fatigue: Secondary | ICD-10-CM

## 2024-01-02 DIAGNOSIS — M545 Low back pain, unspecified: Secondary | ICD-10-CM

## 2024-01-02 DIAGNOSIS — E1165 Type 2 diabetes mellitus with hyperglycemia: Secondary | ICD-10-CM | POA: Diagnosis not present

## 2024-01-02 DIAGNOSIS — M4856XA Collapsed vertebra, not elsewhere classified, lumbar region, initial encounter for fracture: Secondary | ICD-10-CM | POA: Diagnosis not present

## 2024-01-02 DIAGNOSIS — I739 Peripheral vascular disease, unspecified: Secondary | ICD-10-CM | POA: Diagnosis not present

## 2024-01-02 DIAGNOSIS — R102 Pelvic and perineal pain: Secondary | ICD-10-CM

## 2024-01-02 DIAGNOSIS — G20A1 Parkinson's disease without dyskinesia, without mention of fluctuations: Secondary | ICD-10-CM | POA: Diagnosis not present

## 2024-01-02 LAB — CBC WITH DIFFERENTIAL/PLATELET
Basophils Absolute: 0 10*3/uL (ref 0.0–0.1)
Basophils Relative: 0.2 % (ref 0.0–3.0)
Eosinophils Absolute: 0.1 10*3/uL (ref 0.0–0.7)
Eosinophils Relative: 0.6 % (ref 0.0–5.0)
HCT: 31.8 % — ABNORMAL LOW (ref 39.0–52.0)
Hemoglobin: 10.1 g/dL — ABNORMAL LOW (ref 13.0–17.0)
Lymphocytes Relative: 7.8 % — ABNORMAL LOW (ref 12.0–46.0)
Lymphs Abs: 0.7 10*3/uL (ref 0.7–4.0)
MCHC: 31.9 g/dL (ref 30.0–36.0)
MCV: 87 fl (ref 78.0–100.0)
Monocytes Absolute: 0.5 10*3/uL (ref 0.1–1.0)
Monocytes Relative: 6 % (ref 3.0–12.0)
Neutro Abs: 7.2 10*3/uL (ref 1.4–7.7)
Neutrophils Relative %: 85.4 % — ABNORMAL HIGH (ref 43.0–77.0)
Platelets: 266 10*3/uL (ref 150.0–400.0)
RBC: 3.65 Mil/uL — ABNORMAL LOW (ref 4.22–5.81)
RDW: 15.8 % — ABNORMAL HIGH (ref 11.5–15.5)
WBC: 8.4 10*3/uL (ref 4.0–10.5)

## 2024-01-02 LAB — HEMOGLOBIN A1C: Hgb A1c MFr Bld: 5.6 % (ref 4.6–6.5)

## 2024-01-02 LAB — TSH: TSH: 0.68 u[IU]/mL (ref 0.35–5.50)

## 2024-01-02 NOTE — Progress Notes (Signed)
 01/03/2024 9:01 AM   Walter [name] 02/07/1946 [id number]  Referring provider: Gilmore Laroche, FNP [address] [place],  [place] [address]  No chief complaint on file.   HPI: This 78 year old male comes in today, referred by Gilmore Laroche for evaluation and management of blood from his urethra.  In mid January, he had 1 episode of blood dripping out of his urethra after urinating.  This was not associated with blood in his urine, as there was no significant blood in the toilet.  He had no hematuria before or after.  He did have a urinalysis performed on 17 January which revealed 2+ blood on dipstick.  Microscopic not performed.  Prior urinalyses were all negative for blood.  He is on aspirin, but no blood thinners.  He has had a significant number of vascular procedures performed in 2024.  He did have anemia, thought to be of GI source.  He has never had to see a urologist before for any issues.  Last PSA performed in 2020 was 1.21. PMH: Past Medical History:  Diagnosis Date   Allergy    Cancer (HCC)    Skin cancer   Cataract    Coronary artery disease    Diabetes mellitus    Diverticulosis    Esophageal stricture    GERD (gastroesophageal reflux disease)    History of hiatal hernia    Hyperlipidemia    Hypertension    Peripheral arterial disease (HCC)    Peripheral vascular disease (HCC)     Surgical History: Past Surgical History:  Procedure Laterality Date   ABDOMINAL AORTIC ENDOVASCULAR STENT GRAFT N/A 09/16/2023   Procedure: ABDOMINAL AORTA ANEURYSM STENT USING COOK 32 X STENT AND ENDO-ANCHORS X 7;  Surgeon: Maeola Harman, MD;  Location: [facility] OR;  Service: Vascular;  Laterality: N/A;   ANEURYSM COILING Right 09/16/2023   Procedure: COILING TO RIGHT COMMON ILIAC;  Surgeon: Maeola Harman, MD;  Location: [facility] OR;  Service: Vascular;  Laterality: Right;   BIOPSY  03/07/2023   Procedure: BIOPSY;  Surgeon: Beverley Fiedler, MD;  Location:  [facility] ENDOSCOPY;  Service: Gastroenterology;;   BYPASS GRAFT FEMORAL-PERONEAL Left 09/16/2023   Procedure: LEFT EXTERNAL ILIAC TO COMMON FEMORAL BYPASS USING HEMASHIELD GOLD GRAFT;  Surgeon: Maeola Harman, MD;  Location: [facility] OR;  Service: Vascular;  Laterality: Left;   CATARACT EXTRACTION Bilateral    COLONOSCOPY WITH PROPOFOL N/A 03/07/2023   Procedure: COLONOSCOPY WITH PROPOFOL;  Surgeon: Beverley Fiedler, MD;  Location: [facility] ENDOSCOPY;  Service: Gastroenterology;  Laterality: N/A;   CORONARY ANGIOPLASTY WITH STENT PLACEMENT  2004   ENDARTERECTOMY Left 09/16/2023   Procedure: LEFT ILIAC ENDARTERECTOMY;  Surgeon: Maeola Harman, MD;  Location: [facility] OR;  Service: Vascular;  Laterality: Left;   ENDARTERECTOMY FEMORAL Bilateral 09/16/2023   Procedure: BILATERAL FEMORAL ENDARTERECTOMY;  Surgeon: Maeola Harman, MD;  Location: [facility] OR;  Service: Vascular;  Laterality: Bilateral;   ENTEROSCOPY N/A 09/28/2023   Procedure: ENTEROSCOPY;  Surgeon: Imogene Burn, MD;  Location: [facility] ENDOSCOPY;  Service: Gastroenterology;  Laterality: N/A;   ESOPHAGOGASTRODUODENOSCOPY  06/07/2012   Procedure: ESOPHAGOGASTRODUODENOSCOPY (EGD);  Surgeon: Hart Carwin, MD;  Location: [facility] ENDOSCOPY;  Service: Endoscopy;  Laterality: N/A;   ESOPHAGOGASTRODUODENOSCOPY (EGD) WITH PROPOFOL N/A 03/07/2023   Procedure: ESOPHAGOGASTRODUODENOSCOPY (EGD) WITH PROPOFOL;  Surgeon: Beverley Fiedler, MD;  Location: [facility] ENDOSCOPY;  Service: Gastroenterology;  Laterality: N/A;   FEMORAL-FEMORAL BYPASS GRAFT Bilateral 09/16/2023   Procedure: LEFT TO RIGHT BYPASS  GRAFT FEMORAL-FEMORAL ARTERY USING HEMSHIELD GOLD GRAFT;  Surgeon: Maeola Harman, MD;  Location: [facility] OR;  Service: Vascular;  Laterality: Bilateral;   HOT HEMOSTASIS N/A 03/07/2023   Procedure: HOT HEMOSTASIS (ARGON PLASMA COAGULATION/BICAP);  Surgeon: Beverley Fiedler, MD;  Location: [facility] ENDOSCOPY;  Service: Gastroenterology;  Laterality: N/A;   HOT  HEMOSTASIS N/A 09/28/2023   Procedure: HOT HEMOSTASIS (ARGON PLASMA COAGULATION;  Surgeon: Imogene Burn, MD;  Location: [facility] ENDOSCOPY;  Service: Gastroenterology;  Laterality: N/A;   IR KYPHO LUMBAR INC FX REDUCE BONE BX UNI/BIL CANNULATION INC/IMAGING  03/02/2023   IVC FILTER INSERTION N/A 03/09/2023   Procedure: IVC FILTER INSERTION;  Surgeon: Victorino Sparrow, MD;  Location: [facility] INVASIVE CV LAB;  Service: Cardiovascular;  Laterality: N/A;   POLYPECTOMY  03/07/2023   Procedure: POLYPECTOMY;  Surgeon: Beverley Fiedler, MD;  Location: [facility] ENDOSCOPY;  Service: Gastroenterology;;   SHOULDER SURGERY  11/01/2004    Home Medications:  Allergies as of 01/03/2024   No Known Allergies      Medication List        Accurate as of January 02, 2024  9:01 AM. If you have any questions, ask your nurse or doctor.          acetaminophen 325 MG tablet Commonly known as: TYLENOL Take 1-2 tablets (325-650 mg total) by mouth every 4 (four) hours as needed for mild pain (pain score 1-3).   Allergy Relief 10 MG tablet Generic drug: loratadine Take 10 mg by mouth daily.   amLODipine 5 MG tablet Commonly known as: NORVASC Take 1 tablet by mouth once daily   aspirin EC 81 MG tablet Take 1 tablet (81 mg total) by mouth daily. Swallow whole.   atorvastatin 40 MG tablet Commonly known as: LIPITOR Take 1 tablet by mouth once daily   Calcium Antacid 500 MG chewable tablet Generic drug: calcium carbonate Chew 2 tablets (400 mg of elemental calcium total) by mouth 2 (two) times daily with a meal.   cyanocobalamin 2000 MCG tablet Take 1 tablet (2,000 mcg total) by mouth daily.   docusate sodium 100 MG capsule Commonly known as: COLACE Take 1 capsule (100 mg total) by mouth daily.   Iron 325 (65 Fe) MG Tabs Take 1 tablet (325 mg total) by mouth daily.   losartan 25 MG tablet Commonly known as: COZAAR Take 1 tablet (25 mg total) by mouth at bedtime.   losartan 100 MG tablet Commonly known as:  COZAAR Take 100 mg by mouth daily.   memantine 10 MG tablet Commonly known as: NAMENDA Take 1 tablet twice a day   metFORMIN 500 MG tablet Commonly known as: GLUCOPHAGE Take 1 tablet (500 mg total) by mouth 2 (two) times daily with a meal.   metFORMIN 1000 MG tablet Commonly known as: GLUCOPHAGE Take 1,000 mg by mouth 2 (two) times daily.   multivitamin with minerals Tabs tablet Take 1 tablet by mouth daily.   ONE TOUCH ULTRA 2 w/Device Kit 1 kit by Does not apply route daily.   OneTouch Ultra test strip Generic drug: glucose blood USE 1 STRIP TO CHECK GLUCOSE ONCE DAILY   onetouch ultrasoft lancets Check blood sugars once daily. Dx: E11.51   pantoprazole 40 MG tablet Commonly known as: PROTONIX Take 1 tablet (40 mg total) by mouth 2 (two) times daily.        Allergies: No Known Allergies  Family History: Family History  Problem Relation Age of Onset   Breast cancer Mother  Healthy Son    Healthy Son    Parkinson's disease Sister    Colon cancer Neg Hx    Esophageal cancer Neg Hx    Rectal cancer Neg Hx    Stomach cancer Neg Hx     Social History:  reports that he quit smoking about 24 years ago. His smoking use included cigarettes. He has never used smokeless tobacco. He reports that he does not drink alcohol and does not use drugs.  ROS: All other review of systems were reviewed and are negative except what is noted above in HPI  Physical Exam: There were no vitals taken for this visit.  Constitutional:  Alert and oriented, No acute distress. HEENT: [place] AT, moist mucus membranes.  Trachea midline, no masses. Cardiovascular: No clubbing, cyanosis, or edema. Respiratory: Normal respiratory effort, no increased work of breathing. Skin: No rashes, bruises or suspicious lesions. Neurologic: Grossly intact, no focal deficits, moving all 4 extremities. Psychiatric: Normal mood and affect.  Laboratory Data: Lab Results  Component Value Date   WBC 7.1  11/18/2023   HGB 10.5 (L) 11/18/2023   HCT 33.7 (L) 11/18/2023   MCV 91 11/18/2023   PLT 221 11/18/2023    Lab Results  Component Value Date   CREATININE 1.26 11/18/2023    Lab Results  Component Value Date   PSA 1.21 04/19/2019   PSA 0.89 03/26/2014   PSA 0.76 03/06/2012    No results found for: "TESTOSTERONE"  Lab Results  Component Value Date   HGBA1C 6.0 10/19/2023    Urinalysis    Component Value Date/Time   COLORURINE YELLOW 09/09/2023 1121   APPEARANCEUR Cloudy (A) 11/18/2023 1302   LABSPEC 1.018 09/09/2023 1121   PHURINE 5.0 09/09/2023 1121   GLUCOSEU Negative 11/18/2023 1302   HGBUR NEGATIVE 09/09/2023 1121   HGBUR negative 09/17/2009 0847   BILIRUBINUR 1+ (A) 11/18/2023 1302   KETONESUR NEGATIVE 09/09/2023 1121   PROTEINUR Trace (A) 11/18/2023 1302   PROTEINUR NEGATIVE 09/09/2023 1121   UROBILINOGEN 0.2 10/01/2016 1040   UROBILINOGEN 0.2 09/17/2009 0847   NITRITE Negative 11/18/2023 1302   NITRITE NEGATIVE 09/09/2023 1121   LEUKOCYTESUR Negative 11/18/2023 1302   LEUKOCYTESUR NEGATIVE 09/09/2023 1121    No results found for: "LABMICR", "WBCUA", "RBCUA", "LABEPIT", "MUCUS", "BACTERIA"  Pertinent Imaging:  Prior CT images reviewed  Prior physicians records reviewed   Assessment & Plan:    Probable urethral bleeding, 1 episode, not persistent.  Urinalysis today is clear.  I have reassured the patient and his wife.  They will return if recurrent issues, otherwise they will come back as needed There are no diagnoses linked to this encounter.  No follow-ups on file.  Chelsea Aus, MD  [facility] [place]

## 2024-01-02 NOTE — Progress Notes (Addendum)
 Established Patient Office Visit  Subjective   Patient ID: Walter [name], male    DOB: [birth year]  Age: 78 y.o. MRN: [id number]  Chief Complaint  Patient presents with   Medical Management of Chronic Issues    HPI   Walter [name] is here accompanied by his wife who is his primary caregiver.  He had abdominal aortic aneurysm repair several months ago and has recovered fairly well although has had a few weeks recently of progressive generalized weakness and a few falls.  His appetite has improved.  He also has past history of pulmonary embolism, hypertension, Parkinson's disease, GERD, B12 deficiency, type 2 diabetes  He had 3 reported falls in the past few weeks.  Increasing weakness.  Has noted a little bit of groin pain slightly worse on the left side.  Ambulates with a walker.  During admission last spring had L1 compression fracture.  Had little bit of low back pain recently.  Assessment difficulty sleeping at night secondary to pain.  They are requesting prescription for wheelchair to facilitate transportation.  Wife states that he is very sedentary and makes very little effort to exercise and walk.  No recent reported fever.  No dysuria.  No stool changes.  No focal weakness.  No abdominal pain.  Past Medical History:  Diagnosis Date   Allergy    Cancer [facility])    Skin cancer   Cataract    Coronary artery disease    Diabetes mellitus    Diverticulosis    Esophageal stricture    GERD (gastroesophageal reflux disease)    History of hiatal hernia    Hyperlipidemia    Hypertension    Peripheral arterial disease (HCC)    Peripheral vascular disease (HCC)    Past Surgical History:  Procedure Laterality Date   ABDOMINAL AORTIC ENDOVASCULAR STENT GRAFT N/A 09/16/2023   Procedure: ABDOMINAL AORTA ANEURYSM STENT USING COOK 32 X STENT AND ENDO-ANCHORS X 7;  Surgeon: Maeola Harman, MD;  Location: [facility] OR;  Service: Vascular;  Laterality: N/A;   ANEURYSM COILING Right  09/16/2023   Procedure: COILING TO RIGHT COMMON ILIAC;  Surgeon: Maeola Harman, MD;  Location: [facility] OR;  Service: Vascular;  Laterality: Right;   BIOPSY  03/07/2023   Procedure: BIOPSY;  Surgeon: Beverley Fiedler, MD;  Location: [facility] ENDOSCOPY;  Service: Gastroenterology;;   BYPASS GRAFT FEMORAL-PERONEAL Left 09/16/2023   Procedure: LEFT EXTERNAL ILIAC TO COMMON FEMORAL BYPASS USING HEMASHIELD GOLD GRAFT;  Surgeon: Maeola Harman, MD;  Location: [facility] OR;  Service: Vascular;  Laterality: Left;   CATARACT EXTRACTION Bilateral    COLONOSCOPY WITH PROPOFOL N/A 03/07/2023   Procedure: COLONOSCOPY WITH PROPOFOL;  Surgeon: Beverley Fiedler, MD;  Location: [facility] ENDOSCOPY;  Service: Gastroenterology;  Laterality: N/A;   CORONARY ANGIOPLASTY WITH STENT PLACEMENT  2004   ENDARTERECTOMY Left 09/16/2023   Procedure: LEFT ILIAC ENDARTERECTOMY;  Surgeon: Maeola Harman, MD;  Location: [facility] OR;  Service: Vascular;  Laterality: Left;   ENDARTERECTOMY FEMORAL Bilateral 09/16/2023   Procedure: BILATERAL FEMORAL ENDARTERECTOMY;  Surgeon: Maeola Harman, MD;  Location: [facility] OR;  Service: Vascular;  Laterality: Bilateral;   ENTEROSCOPY N/A 09/28/2023   Procedure: ENTEROSCOPY;  Surgeon: Imogene Burn, MD;  Location: [facility] ENDOSCOPY;  Service: Gastroenterology;  Laterality: N/A;   ESOPHAGOGASTRODUODENOSCOPY  06/07/2012   Procedure: ESOPHAGOGASTRODUODENOSCOPY (EGD);  Surgeon: Hart Carwin, MD;  Location: [facility] ENDOSCOPY;  Service: Endoscopy;  Laterality: N/A;   ESOPHAGOGASTRODUODENOSCOPY (EGD) WITH PROPOFOL N/A 03/07/2023  Procedure: ESOPHAGOGASTRODUODENOSCOPY (EGD) WITH PROPOFOL;  Surgeon: Beverley Fiedler, MD;  Location: [facility] ENDOSCOPY;  Service: Gastroenterology;  Laterality: N/A;   FEMORAL-FEMORAL BYPASS GRAFT Bilateral 09/16/2023   Procedure: LEFT TO RIGHT BYPASS GRAFT FEMORAL-FEMORAL ARTERY USING HEMSHIELD GOLD GRAFT;  Surgeon: Maeola Harman, MD;  Location: [facility] OR;  Service:  Vascular;  Laterality: Bilateral;   HOT HEMOSTASIS N/A 03/07/2023   Procedure: HOT HEMOSTASIS (ARGON PLASMA COAGULATION/BICAP);  Surgeon: Beverley Fiedler, MD;  Location: [facility] ENDOSCOPY;  Service: Gastroenterology;  Laterality: N/A;   HOT HEMOSTASIS N/A 09/28/2023   Procedure: HOT HEMOSTASIS (ARGON PLASMA COAGULATION;  Surgeon: Imogene Burn, MD;  Location: [facility] ENDOSCOPY;  Service: Gastroenterology;  Laterality: N/A;   IR KYPHO LUMBAR INC FX REDUCE BONE BX UNI/BIL CANNULATION INC/IMAGING  03/02/2023   IVC FILTER INSERTION N/A 03/09/2023   Procedure: IVC FILTER INSERTION;  Surgeon: Victorino Sparrow, MD;  Location: [facility] INVASIVE CV LAB;  Service: Cardiovascular;  Laterality: N/A;   POLYPECTOMY  03/07/2023   Procedure: POLYPECTOMY;  Surgeon: Beverley Fiedler, MD;  Location: [facility] ENDOSCOPY;  Service: Gastroenterology;;   SHOULDER SURGERY  11/01/2004    reports that he quit smoking about 24 years ago. His smoking use included cigarettes. He has never used smokeless tobacco. He reports that he does not drink alcohol and does not use drugs. family history includes Breast cancer in his mother; Healthy in his son and son; Parkinson's disease in his sister. No Known Allergies  Review of Systems  Constitutional:  Positive for malaise/fatigue. Negative for chills and fever.  Respiratory:  Negative for shortness of breath.   Cardiovascular:  Negative for chest pain.  Gastrointestinal:  Negative for abdominal pain.  Genitourinary:  Negative for dysuria.  Musculoskeletal:  Positive for back pain.      Objective:     BP (!) 150/72 (BP Location: Left Arm, Patient Position: Sitting, Cuff Size: Normal)   Pulse (!) 105   Temp 97.7 F (36.5 C) (Oral)   Wt 123 lb 4.8 oz (55.9 kg)   SpO2 96%   BMI 19.31 kg/m  BP Readings from Last 3 Encounters:  01/02/24 (!) 150/72  11/18/23 (!) 162/80  11/16/23 (!) 155/78   Wt Readings from Last 3 Encounters:  01/02/24 123 lb 4.8 oz (55.9 kg)  11/18/23 128 lb (58.1 kg)   11/16/23 132 lb (59.9 kg)      Physical Exam Vitals reviewed.  Constitutional:      General: He is not in acute distress.    Appearance: He is not ill-appearing.  HENT:     Head: Normocephalic and atraumatic.  Cardiovascular:     Rate and Rhythm: Normal rate.  Pulmonary:     Effort: Pulmonary effort is normal.     Breath sounds: Normal breath sounds. No wheezing or rales.  Musculoskeletal:     Right lower leg: No edema.     Left lower leg: No edema.     Comments: He has some bruising visible along the right lateral rib cage area.  No localized bony tenderness.  Could internally and externally rotate both hips fairly well without pain with patient sitting in wheelchair  Neurological:     Mental Status: He is alert.      No results found for any visits on 01/02/24.    The ASCVD Risk score (Arnett DK, et al., 2019) failed to calculate for the following reasons:   The valid total cholesterol range is 130 to 320 mg/dL    Assessment &  Plan:   Problem List Items Addressed This Visit       Unprioritized   Parkinson's disease (HCC)   Relevant Orders   DME Wheelchair manual   Other Visit Diagnoses       Pain in pelvis    -  Primary   Relevant Orders   DG Pelvis 1-2 Views     Fatigue, unspecified type       Relevant Orders   CBC with Differential/Platelet   CMP   TSH     Type 2 diabetes mellitus with hyperglycemia, without long-term current use of insulin (HCC)       Relevant Orders   Hemoglobin A1c     Acute bilateral low back pain without sciatica       Relevant Orders   DG Lumbar Spine Complete     78 year old white male with chronic medical problems as above.  He presents today with 3 falls in the past few weeks along with some progressive fatigue and generalized weakness.  He has background of Parkinson disease and recent major abdominal surgery with aneurysm repair last spring and has had some deconditioning and progressive weakness since then.  Has not  been ambulating much.  We discussed several items as follows  -Obtain several labs including CBC, A1c, CMP, TSH -Set up home health evaluation for nursing, PT, OT -DME order for wheelchair to assist with transportation -Obtain x-rays of the pelvis and lumbar spine with recent falls.  Prior history of L1 compression fracture.  Is complaining of some mild pelvic pain especially on the left side.  Low clinical suspicion for hip fracture.  No follow-ups on file.   X-rays show no acute fracture.  Wife called back stating his pain is worst low back region.  Does have history of prior compression fracture L1.  Pain has been difficult to control at times.  Will obtain MRI lumbar spine to further assess  Evelena Peat, MD

## 2024-01-03 ENCOUNTER — Encounter: Payer: Self-pay | Admitting: Urology

## 2024-01-03 ENCOUNTER — Ambulatory Visit: Payer: Medicare HMO | Admitting: Urology

## 2024-01-03 VITALS — BP 172/78 | HR 98

## 2024-01-03 DIAGNOSIS — N368 Other specified disorders of urethra: Secondary | ICD-10-CM | POA: Diagnosis not present

## 2024-01-03 DIAGNOSIS — R3129 Other microscopic hematuria: Secondary | ICD-10-CM

## 2024-01-03 LAB — COMPREHENSIVE METABOLIC PANEL
ALT: 14 U/L (ref 0–53)
AST: 17 U/L (ref 0–37)
Albumin: 4 g/dL (ref 3.5–5.2)
Alkaline Phosphatase: 83 U/L (ref 39–117)
BUN: 31 mg/dL — ABNORMAL HIGH (ref 6–23)
CO2: 25 meq/L (ref 19–32)
Calcium: 9.7 mg/dL (ref 8.4–10.5)
Chloride: 106 meq/L (ref 96–112)
Creatinine, Ser: 1.05 mg/dL (ref 0.40–1.50)
GFR: 67.91 mL/min (ref >60.00)
Glucose, Bld: 90 mg/dL (ref 70–99)
Potassium: 4 meq/L (ref 3.5–5.1)
Sodium: 145 meq/L (ref 135–145)
Total Bilirubin: 0.3 mg/dL (ref 0.2–1.2)
Total Protein: 7.2 g/dL (ref 6.0–8.3)

## 2024-01-04 LAB — URINALYSIS, ROUTINE W REFLEX MICROSCOPIC
Bilirubin, UA: NEGATIVE
Glucose, UA: NEGATIVE
Nitrite, UA: NEGATIVE
RBC, UA: NEGATIVE
Specific Gravity, UA: 1.025 (ref 1.005–1.030)
Urobilinogen, Ur: 0.2 mg/dL (ref 0.2–1.0)
pH, UA: 6 (ref 5.0–7.5)

## 2024-01-04 LAB — MICROSCOPIC EXAMINATION: Bacteria, UA: NONE SEEN

## 2024-01-05 ENCOUNTER — Telehealth: Payer: Self-pay | Admitting: *Deleted

## 2024-01-05 DIAGNOSIS — H43813 Vitreous degeneration, bilateral: Secondary | ICD-10-CM | POA: Diagnosis not present

## 2024-01-05 LAB — HM DIABETES EYE EXAM

## 2024-01-05 MED ORDER — METFORMIN HCL 500 MG PO TABS: 500.0000 mg | ORAL_TABLET | Freq: Two times a day (BID) | ORAL | 1 refills | Status: DC

## 2024-01-05 NOTE — Telephone Encounter (Signed)
 Please see result note

## 2024-01-05 NOTE — Addendum Note (Signed)
 Addended by: Christy Sartorius on: 01/05/2024 10:17 AM   Modules accepted: Orders

## 2024-01-05 NOTE — Addendum Note (Signed)
 Addended by: Christy Sartorius on: 01/05/2024 10:13 AM   Modules accepted: Orders

## 2024-01-05 NOTE — Telephone Encounter (Signed)
 Copied from CRM [id number]. Topic: Clinical - Lab/Test Results >> Jan 05, 2024  9:16 AM Deaijah H wrote: Reason for CRM: Patient would like to have Dr. Caryl Never or his nurse regarding his labs and imaging. Please call Wife [phone] or [phone]

## 2024-01-09 ENCOUNTER — Ambulatory Visit: Payer: Self-pay | Admitting: Family Medicine

## 2024-01-09 MED ORDER — ONDANSETRON 8 MG PO TBDP: 8.0000 mg | ORAL_TABLET | Freq: Three times a day (TID) | ORAL | 0 refills | Status: AC | PRN

## 2024-01-09 NOTE — Addendum Note (Signed)
 Addended by: Kristian Covey on: 01/09/2024 09:56 AM   Modules accepted: Orders

## 2024-01-09 NOTE — Telephone Encounter (Signed)
 Copied from CRM [id number]. Topic: Clinical - Red Word Triage >> Jan 09, 2024  3:16 PM Fonda Kinder J wrote: Red Word that prompted transfer to Nurse Triage: Vomiting/Diarrhea Reason for Disposition  [1] MILD or MODERATE vomiting AND [2] present > 48 hours (2 days) (Exception: Mild vomiting with associated diarrhea.)  Answer Assessment - Initial Assessment Questions 1. VOMITING SEVERITY: "How many times have you vomited in the past 24 hours?"     - MILD:  1 - 2 times/day    - MODERATE: 3 - 5 times/day, decreased oral intake without significant weight loss or symptoms of dehydration    - SEVERE: 6 or more times/day, vomits everything or nearly everything, with significant weight loss, symptoms of dehydration      Wife calling in.    He is vomiting and having diarrhea.    2. ONSET: "When did the vomiting begin?"      Started today.    3. FLUIDS: "What fluids or food have you vomited up today?" "Have you been able to keep any fluids down?"     Not able to keep anything down.   Had one episode of diarrhea. 4. ABDOMEN PAIN: "Are your having any abdomen pain?" If Yes : "How bad is it and what does it feel like?" (e.g., crampy, dull, intermittent, constant)      Yes 5. DIARRHEA: "Is there any diarrhea?" If Yes, ask: "How many times today?"      Yes 6. CONTACTS: "Is there anyone else in the family with the same symptoms?"      No 7. CAUSE: "What do you think is causing your vomiting?"     GI bug 8. HYDRATION STATUS: "Any signs of dehydration?" (e.g., dry mouth [not only dry lips], too weak to stand) "When did you last urinate?"     Can't keep anything off. 9. OTHER SYMPTOMS: "Do you have any other symptoms?" (e.g., fever, headache, vertigo, vomiting blood or coffee grounds, recent head injury)     No fever 10. PREGNANCY: "Is there any chance you are pregnant?" "When was your last menstrual period?"       N/A  Protocols used: Vomiting-A-AH  Chief Complaint: Wife called in for husband  He is vomiting  and having diarrhea that started this morning.   Having abd pain too.  Wife is having stomach pain too.    They are unable to get into the office.   Don't have a smart phone to do a virtual visit.   I called into the practice to see if Dr. Caryl Never can call in something but he needs to see them or do a virtual visit.    I let wife know this.   Again she said she can't get in there because she is sick too.     "I'll see if a neighbor with a smart phone can come over and do a virtual visit".    "I'll call you back" and hung up. Symptoms: vomiting and diarrhea Frequency: Started this morning Pertinent Negatives: Patient denies fever  Disposition: [] ED /[] Urgent Care (no appt availability in office) / [x] Appointment(In office/virtual)/ []  [facility]/ [] Home Care/ [] Refused Recommended Disposition /[] [facility]/ []  Follow-up with PCP Additional Notes: Pt's wife is going to see if a neighbor can help them do a virtual visit so something can be called in for the vomiting and diarrhea, especially the vomiting.   She said she would call back.

## 2024-01-09 NOTE — Telephone Encounter (Signed)
Rx sent and patient aware 

## 2024-01-09 NOTE — Addendum Note (Signed)
 Addended by: Christy Sartorius on: 01/09/2024 05:03 PM   Modules accepted: Orders

## 2024-01-13 ENCOUNTER — Other Ambulatory Visit: Payer: Self-pay | Admitting: Family Medicine

## 2024-01-13 ENCOUNTER — Other Ambulatory Visit: Payer: Self-pay | Admitting: Neurology

## 2024-01-13 DIAGNOSIS — E785 Hyperlipidemia, unspecified: Secondary | ICD-10-CM

## 2024-01-17 ENCOUNTER — Ambulatory Visit: Payer: Medicare HMO | Admitting: Family Medicine

## 2024-01-18 ENCOUNTER — Encounter: Payer: Self-pay | Admitting: Family Medicine

## 2024-01-19 ENCOUNTER — Encounter: Payer: Self-pay | Admitting: Family Medicine

## 2024-01-20 ENCOUNTER — Encounter: Payer: Self-pay | Admitting: Family Medicine

## 2024-01-20 DIAGNOSIS — Z87891 Personal history of nicotine dependence: Secondary | ICD-10-CM | POA: Diagnosis not present

## 2024-01-20 DIAGNOSIS — R296 Repeated falls: Secondary | ICD-10-CM | POA: Diagnosis not present

## 2024-01-20 DIAGNOSIS — E1165 Type 2 diabetes mellitus with hyperglycemia: Secondary | ICD-10-CM | POA: Diagnosis not present

## 2024-01-20 DIAGNOSIS — E538 Deficiency of other specified B group vitamins: Secondary | ICD-10-CM | POA: Diagnosis not present

## 2024-01-20 DIAGNOSIS — I714 Abdominal aortic aneurysm, without rupture, unspecified: Secondary | ICD-10-CM | POA: Diagnosis not present

## 2024-01-20 DIAGNOSIS — G20A1 Parkinson's disease without dyskinesia, without mention of fluctuations: Secondary | ICD-10-CM | POA: Diagnosis not present

## 2024-01-20 DIAGNOSIS — Z7984 Long term (current) use of oral hypoglycemic drugs: Secondary | ICD-10-CM | POA: Diagnosis not present

## 2024-01-20 DIAGNOSIS — I10 Essential (primary) hypertension: Secondary | ICD-10-CM | POA: Diagnosis not present

## 2024-01-20 DIAGNOSIS — E1151 Type 2 diabetes mellitus with diabetic peripheral angiopathy without gangrene: Secondary | ICD-10-CM | POA: Diagnosis not present

## 2024-01-20 DIAGNOSIS — Z9181 History of falling: Secondary | ICD-10-CM | POA: Diagnosis not present

## 2024-01-20 DIAGNOSIS — G20C Parkinsonism, unspecified: Secondary | ICD-10-CM | POA: Diagnosis not present

## 2024-01-20 DIAGNOSIS — I2510 Atherosclerotic heart disease of native coronary artery without angina pectoris: Secondary | ICD-10-CM | POA: Diagnosis not present

## 2024-01-24 ENCOUNTER — Encounter: Payer: Self-pay | Admitting: Family Medicine

## 2024-01-24 ENCOUNTER — Telehealth: Payer: Self-pay

## 2024-01-24 DIAGNOSIS — E1165 Type 2 diabetes mellitus with hyperglycemia: Secondary | ICD-10-CM | POA: Diagnosis not present

## 2024-01-24 DIAGNOSIS — Z7984 Long term (current) use of oral hypoglycemic drugs: Secondary | ICD-10-CM | POA: Diagnosis not present

## 2024-01-24 DIAGNOSIS — Z9181 History of falling: Secondary | ICD-10-CM | POA: Diagnosis not present

## 2024-01-24 DIAGNOSIS — I10 Essential (primary) hypertension: Secondary | ICD-10-CM | POA: Diagnosis not present

## 2024-01-24 DIAGNOSIS — E1151 Type 2 diabetes mellitus with diabetic peripheral angiopathy without gangrene: Secondary | ICD-10-CM | POA: Diagnosis not present

## 2024-01-24 DIAGNOSIS — R296 Repeated falls: Secondary | ICD-10-CM | POA: Diagnosis not present

## 2024-01-24 DIAGNOSIS — G20C Parkinsonism, unspecified: Secondary | ICD-10-CM | POA: Diagnosis not present

## 2024-01-24 DIAGNOSIS — E538 Deficiency of other specified B group vitamins: Secondary | ICD-10-CM | POA: Diagnosis not present

## 2024-01-24 DIAGNOSIS — I714 Abdominal aortic aneurysm, without rupture, unspecified: Secondary | ICD-10-CM | POA: Diagnosis not present

## 2024-01-24 DIAGNOSIS — I2510 Atherosclerotic heart disease of native coronary artery without angina pectoris: Secondary | ICD-10-CM | POA: Diagnosis not present

## 2024-01-24 DIAGNOSIS — Z87891 Personal history of nicotine dependence: Secondary | ICD-10-CM | POA: Diagnosis not present

## 2024-01-24 DIAGNOSIS — G20A1 Parkinson's disease without dyskinesia, without mention of fluctuations: Secondary | ICD-10-CM | POA: Diagnosis not present

## 2024-01-24 NOTE — Telephone Encounter (Addendum)
 Called and spoke with Marcelino Duster from [facility] home health,  Gave a VO for O.T. per Dr. Caryl Never

## 2024-01-24 NOTE — Telephone Encounter (Signed)
 Copied from CRM [id number]. Topic: Clinical - Home Health Verbal Orders >> Jan 24, 2024  2:51 PM Drema Balzarine wrote: Caller/Agency: Marcelino Duster from [facility]  Callback Number: [phone] Service Requested: Occupational Therapy Frequency: 1wk 3, hold for one week, 2wk 2  Any new concerns about the patient? No

## 2024-01-25 ENCOUNTER — Ambulatory Visit
Admission: RE | Admit: 2024-01-25 | Discharge: 2024-01-25 | Disposition: A | Discharge status: Home / Self Care | Source: Ambulatory Visit | Attending: Family Medicine | Admitting: Family Medicine

## 2024-01-25 DIAGNOSIS — M47816 Spondylosis without myelopathy or radiculopathy, lumbar region: Secondary | ICD-10-CM | POA: Diagnosis not present

## 2024-01-25 DIAGNOSIS — M4316 Spondylolisthesis, lumbar region: Secondary | ICD-10-CM | POA: Diagnosis not present

## 2024-01-25 DIAGNOSIS — M545 Low back pain, unspecified: Secondary | ICD-10-CM

## 2024-01-25 DIAGNOSIS — M4856XD Collapsed vertebra, not elsewhere classified, lumbar region, subsequent encounter for fracture with routine healing: Secondary | ICD-10-CM | POA: Diagnosis not present

## 2024-01-26 DIAGNOSIS — I2510 Atherosclerotic heart disease of native coronary artery without angina pectoris: Secondary | ICD-10-CM | POA: Diagnosis not present

## 2024-01-26 DIAGNOSIS — Z9181 History of falling: Secondary | ICD-10-CM | POA: Diagnosis not present

## 2024-01-26 DIAGNOSIS — Z7984 Long term (current) use of oral hypoglycemic drugs: Secondary | ICD-10-CM | POA: Diagnosis not present

## 2024-01-26 DIAGNOSIS — E1165 Type 2 diabetes mellitus with hyperglycemia: Secondary | ICD-10-CM | POA: Diagnosis not present

## 2024-01-26 DIAGNOSIS — E1151 Type 2 diabetes mellitus with diabetic peripheral angiopathy without gangrene: Secondary | ICD-10-CM | POA: Diagnosis not present

## 2024-01-26 DIAGNOSIS — G20C Parkinsonism, unspecified: Secondary | ICD-10-CM | POA: Diagnosis not present

## 2024-01-26 DIAGNOSIS — G20A1 Parkinson's disease without dyskinesia, without mention of fluctuations: Secondary | ICD-10-CM | POA: Diagnosis not present

## 2024-01-26 DIAGNOSIS — R296 Repeated falls: Secondary | ICD-10-CM | POA: Diagnosis not present

## 2024-01-26 DIAGNOSIS — E538 Deficiency of other specified B group vitamins: Secondary | ICD-10-CM | POA: Diagnosis not present

## 2024-01-26 DIAGNOSIS — Z87891 Personal history of nicotine dependence: Secondary | ICD-10-CM | POA: Diagnosis not present

## 2024-01-26 DIAGNOSIS — I10 Essential (primary) hypertension: Secondary | ICD-10-CM | POA: Diagnosis not present

## 2024-01-26 DIAGNOSIS — I714 Abdominal aortic aneurysm, without rupture, unspecified: Secondary | ICD-10-CM | POA: Diagnosis not present

## 2024-01-27 DIAGNOSIS — I2510 Atherosclerotic heart disease of native coronary artery without angina pectoris: Secondary | ICD-10-CM | POA: Diagnosis not present

## 2024-01-27 DIAGNOSIS — R296 Repeated falls: Secondary | ICD-10-CM | POA: Diagnosis not present

## 2024-01-27 DIAGNOSIS — E538 Deficiency of other specified B group vitamins: Secondary | ICD-10-CM | POA: Diagnosis not present

## 2024-01-27 DIAGNOSIS — E1151 Type 2 diabetes mellitus with diabetic peripheral angiopathy without gangrene: Secondary | ICD-10-CM | POA: Diagnosis not present

## 2024-01-27 DIAGNOSIS — G20A1 Parkinson's disease without dyskinesia, without mention of fluctuations: Secondary | ICD-10-CM | POA: Diagnosis not present

## 2024-01-27 DIAGNOSIS — Z7984 Long term (current) use of oral hypoglycemic drugs: Secondary | ICD-10-CM | POA: Diagnosis not present

## 2024-01-27 DIAGNOSIS — I10 Essential (primary) hypertension: Secondary | ICD-10-CM | POA: Diagnosis not present

## 2024-01-27 DIAGNOSIS — I714 Abdominal aortic aneurysm, without rupture, unspecified: Secondary | ICD-10-CM | POA: Diagnosis not present

## 2024-01-27 DIAGNOSIS — Z9181 History of falling: Secondary | ICD-10-CM | POA: Diagnosis not present

## 2024-01-27 DIAGNOSIS — Z87891 Personal history of nicotine dependence: Secondary | ICD-10-CM | POA: Diagnosis not present

## 2024-01-27 DIAGNOSIS — E1165 Type 2 diabetes mellitus with hyperglycemia: Secondary | ICD-10-CM | POA: Diagnosis not present

## 2024-01-27 DIAGNOSIS — G20C Parkinsonism, unspecified: Secondary | ICD-10-CM | POA: Diagnosis not present

## 2024-01-31 DIAGNOSIS — R296 Repeated falls: Secondary | ICD-10-CM | POA: Diagnosis not present

## 2024-01-31 DIAGNOSIS — E1151 Type 2 diabetes mellitus with diabetic peripheral angiopathy without gangrene: Secondary | ICD-10-CM | POA: Diagnosis not present

## 2024-01-31 DIAGNOSIS — I10 Essential (primary) hypertension: Secondary | ICD-10-CM | POA: Diagnosis not present

## 2024-01-31 DIAGNOSIS — I2510 Atherosclerotic heart disease of native coronary artery without angina pectoris: Secondary | ICD-10-CM | POA: Diagnosis not present

## 2024-01-31 DIAGNOSIS — G20A1 Parkinson's disease without dyskinesia, without mention of fluctuations: Secondary | ICD-10-CM | POA: Diagnosis not present

## 2024-01-31 DIAGNOSIS — Z87891 Personal history of nicotine dependence: Secondary | ICD-10-CM | POA: Diagnosis not present

## 2024-01-31 DIAGNOSIS — Z9181 History of falling: Secondary | ICD-10-CM | POA: Diagnosis not present

## 2024-01-31 DIAGNOSIS — I714 Abdominal aortic aneurysm, without rupture, unspecified: Secondary | ICD-10-CM | POA: Diagnosis not present

## 2024-01-31 DIAGNOSIS — G20C Parkinsonism, unspecified: Secondary | ICD-10-CM | POA: Diagnosis not present

## 2024-01-31 DIAGNOSIS — E1165 Type 2 diabetes mellitus with hyperglycemia: Secondary | ICD-10-CM | POA: Diagnosis not present

## 2024-01-31 DIAGNOSIS — E538 Deficiency of other specified B group vitamins: Secondary | ICD-10-CM | POA: Diagnosis not present

## 2024-01-31 DIAGNOSIS — Z7984 Long term (current) use of oral hypoglycemic drugs: Secondary | ICD-10-CM | POA: Diagnosis not present

## 2024-02-01 DIAGNOSIS — I10 Essential (primary) hypertension: Secondary | ICD-10-CM | POA: Diagnosis not present

## 2024-02-01 DIAGNOSIS — I714 Abdominal aortic aneurysm, without rupture, unspecified: Secondary | ICD-10-CM | POA: Diagnosis not present

## 2024-02-01 DIAGNOSIS — E1151 Type 2 diabetes mellitus with diabetic peripheral angiopathy without gangrene: Secondary | ICD-10-CM | POA: Diagnosis not present

## 2024-02-01 DIAGNOSIS — Z7984 Long term (current) use of oral hypoglycemic drugs: Secondary | ICD-10-CM | POA: Diagnosis not present

## 2024-02-01 DIAGNOSIS — G20C Parkinsonism, unspecified: Secondary | ICD-10-CM | POA: Diagnosis not present

## 2024-02-01 DIAGNOSIS — I2510 Atherosclerotic heart disease of native coronary artery without angina pectoris: Secondary | ICD-10-CM | POA: Diagnosis not present

## 2024-02-01 DIAGNOSIS — R296 Repeated falls: Secondary | ICD-10-CM | POA: Diagnosis not present

## 2024-02-01 DIAGNOSIS — E1165 Type 2 diabetes mellitus with hyperglycemia: Secondary | ICD-10-CM | POA: Diagnosis not present

## 2024-02-01 DIAGNOSIS — Z9181 History of falling: Secondary | ICD-10-CM | POA: Diagnosis not present

## 2024-02-01 DIAGNOSIS — E538 Deficiency of other specified B group vitamins: Secondary | ICD-10-CM | POA: Diagnosis not present

## 2024-02-01 DIAGNOSIS — G20A1 Parkinson's disease without dyskinesia, without mention of fluctuations: Secondary | ICD-10-CM | POA: Diagnosis not present

## 2024-02-01 DIAGNOSIS — Z87891 Personal history of nicotine dependence: Secondary | ICD-10-CM | POA: Diagnosis not present

## 2024-02-02 ENCOUNTER — Telehealth: Payer: Self-pay | Admitting: *Deleted

## 2024-02-02 NOTE — Telephone Encounter (Signed)
 Copied from CRM [id number]. Topic: Clinical - Lab/Test Results >> Feb 02, 2024 12:35 PM Sim Boast F wrote: Reason for CRM: Patient spouse Rhunette Croft requested call back regarding an update on MRI done 01/25/24

## 2024-02-03 DIAGNOSIS — I10 Essential (primary) hypertension: Secondary | ICD-10-CM | POA: Diagnosis not present

## 2024-02-03 DIAGNOSIS — E1165 Type 2 diabetes mellitus with hyperglycemia: Secondary | ICD-10-CM | POA: Diagnosis not present

## 2024-02-03 DIAGNOSIS — R296 Repeated falls: Secondary | ICD-10-CM | POA: Diagnosis not present

## 2024-02-03 DIAGNOSIS — G20A1 Parkinson's disease without dyskinesia, without mention of fluctuations: Secondary | ICD-10-CM | POA: Diagnosis not present

## 2024-02-03 DIAGNOSIS — E1151 Type 2 diabetes mellitus with diabetic peripheral angiopathy without gangrene: Secondary | ICD-10-CM | POA: Diagnosis not present

## 2024-02-03 DIAGNOSIS — E538 Deficiency of other specified B group vitamins: Secondary | ICD-10-CM | POA: Diagnosis not present

## 2024-02-03 DIAGNOSIS — Z7984 Long term (current) use of oral hypoglycemic drugs: Secondary | ICD-10-CM | POA: Diagnosis not present

## 2024-02-03 DIAGNOSIS — I714 Abdominal aortic aneurysm, without rupture, unspecified: Secondary | ICD-10-CM | POA: Diagnosis not present

## 2024-02-03 DIAGNOSIS — G20C Parkinsonism, unspecified: Secondary | ICD-10-CM | POA: Diagnosis not present

## 2024-02-03 DIAGNOSIS — Z87891 Personal history of nicotine dependence: Secondary | ICD-10-CM | POA: Diagnosis not present

## 2024-02-03 DIAGNOSIS — Z9181 History of falling: Secondary | ICD-10-CM | POA: Diagnosis not present

## 2024-02-03 DIAGNOSIS — I2510 Atherosclerotic heart disease of native coronary artery without angina pectoris: Secondary | ICD-10-CM | POA: Diagnosis not present

## 2024-02-03 NOTE — Telephone Encounter (Signed)
 I spoke with Walter [name] with reading room and MRI has been placed as STAT

## 2024-02-06 DIAGNOSIS — G20C Parkinsonism, unspecified: Secondary | ICD-10-CM | POA: Diagnosis not present

## 2024-02-06 DIAGNOSIS — I10 Essential (primary) hypertension: Secondary | ICD-10-CM | POA: Diagnosis not present

## 2024-02-06 DIAGNOSIS — E538 Deficiency of other specified B group vitamins: Secondary | ICD-10-CM | POA: Diagnosis not present

## 2024-02-06 DIAGNOSIS — Z7984 Long term (current) use of oral hypoglycemic drugs: Secondary | ICD-10-CM | POA: Diagnosis not present

## 2024-02-06 DIAGNOSIS — I2510 Atherosclerotic heart disease of native coronary artery without angina pectoris: Secondary | ICD-10-CM | POA: Diagnosis not present

## 2024-02-06 DIAGNOSIS — E1165 Type 2 diabetes mellitus with hyperglycemia: Secondary | ICD-10-CM | POA: Diagnosis not present

## 2024-02-06 DIAGNOSIS — Z9181 History of falling: Secondary | ICD-10-CM | POA: Diagnosis not present

## 2024-02-06 DIAGNOSIS — I714 Abdominal aortic aneurysm, without rupture, unspecified: Secondary | ICD-10-CM | POA: Diagnosis not present

## 2024-02-06 DIAGNOSIS — Z87891 Personal history of nicotine dependence: Secondary | ICD-10-CM | POA: Diagnosis not present

## 2024-02-06 DIAGNOSIS — G20A1 Parkinson's disease without dyskinesia, without mention of fluctuations: Secondary | ICD-10-CM | POA: Diagnosis not present

## 2024-02-06 DIAGNOSIS — R296 Repeated falls: Secondary | ICD-10-CM | POA: Diagnosis not present

## 2024-02-06 DIAGNOSIS — E1151 Type 2 diabetes mellitus with diabetic peripheral angiopathy without gangrene: Secondary | ICD-10-CM | POA: Diagnosis not present

## 2024-02-07 DIAGNOSIS — Z7984 Long term (current) use of oral hypoglycemic drugs: Secondary | ICD-10-CM | POA: Diagnosis not present

## 2024-02-07 DIAGNOSIS — E538 Deficiency of other specified B group vitamins: Secondary | ICD-10-CM | POA: Diagnosis not present

## 2024-02-07 DIAGNOSIS — R296 Repeated falls: Secondary | ICD-10-CM | POA: Diagnosis not present

## 2024-02-07 DIAGNOSIS — G20C Parkinsonism, unspecified: Secondary | ICD-10-CM | POA: Diagnosis not present

## 2024-02-07 DIAGNOSIS — I2510 Atherosclerotic heart disease of native coronary artery without angina pectoris: Secondary | ICD-10-CM | POA: Diagnosis not present

## 2024-02-07 DIAGNOSIS — Z9181 History of falling: Secondary | ICD-10-CM | POA: Diagnosis not present

## 2024-02-07 DIAGNOSIS — I714 Abdominal aortic aneurysm, without rupture, unspecified: Secondary | ICD-10-CM | POA: Diagnosis not present

## 2024-02-07 DIAGNOSIS — G20A1 Parkinson's disease without dyskinesia, without mention of fluctuations: Secondary | ICD-10-CM | POA: Diagnosis not present

## 2024-02-07 DIAGNOSIS — E1165 Type 2 diabetes mellitus with hyperglycemia: Secondary | ICD-10-CM | POA: Diagnosis not present

## 2024-02-07 DIAGNOSIS — E1151 Type 2 diabetes mellitus with diabetic peripheral angiopathy without gangrene: Secondary | ICD-10-CM | POA: Diagnosis not present

## 2024-02-07 DIAGNOSIS — I10 Essential (primary) hypertension: Secondary | ICD-10-CM | POA: Diagnosis not present

## 2024-02-07 DIAGNOSIS — Z87891 Personal history of nicotine dependence: Secondary | ICD-10-CM | POA: Diagnosis not present

## 2024-02-08 DIAGNOSIS — R296 Repeated falls: Secondary | ICD-10-CM | POA: Diagnosis not present

## 2024-02-08 DIAGNOSIS — Z9181 History of falling: Secondary | ICD-10-CM | POA: Diagnosis not present

## 2024-02-08 DIAGNOSIS — G20C Parkinsonism, unspecified: Secondary | ICD-10-CM | POA: Diagnosis not present

## 2024-02-08 DIAGNOSIS — I10 Essential (primary) hypertension: Secondary | ICD-10-CM | POA: Diagnosis not present

## 2024-02-08 DIAGNOSIS — Z7984 Long term (current) use of oral hypoglycemic drugs: Secondary | ICD-10-CM | POA: Diagnosis not present

## 2024-02-08 DIAGNOSIS — I714 Abdominal aortic aneurysm, without rupture, unspecified: Secondary | ICD-10-CM | POA: Diagnosis not present

## 2024-02-08 DIAGNOSIS — E1165 Type 2 diabetes mellitus with hyperglycemia: Secondary | ICD-10-CM | POA: Diagnosis not present

## 2024-02-08 DIAGNOSIS — E538 Deficiency of other specified B group vitamins: Secondary | ICD-10-CM | POA: Diagnosis not present

## 2024-02-08 DIAGNOSIS — Z87891 Personal history of nicotine dependence: Secondary | ICD-10-CM | POA: Diagnosis not present

## 2024-02-08 DIAGNOSIS — G20A1 Parkinson's disease without dyskinesia, without mention of fluctuations: Secondary | ICD-10-CM | POA: Diagnosis not present

## 2024-02-08 DIAGNOSIS — I2510 Atherosclerotic heart disease of native coronary artery without angina pectoris: Secondary | ICD-10-CM | POA: Diagnosis not present

## 2024-02-08 DIAGNOSIS — E1151 Type 2 diabetes mellitus with diabetic peripheral angiopathy without gangrene: Secondary | ICD-10-CM | POA: Diagnosis not present

## 2024-02-10 DIAGNOSIS — I714 Abdominal aortic aneurysm, without rupture, unspecified: Secondary | ICD-10-CM | POA: Diagnosis not present

## 2024-02-10 DIAGNOSIS — R296 Repeated falls: Secondary | ICD-10-CM | POA: Diagnosis not present

## 2024-02-10 DIAGNOSIS — E1151 Type 2 diabetes mellitus with diabetic peripheral angiopathy without gangrene: Secondary | ICD-10-CM | POA: Diagnosis not present

## 2024-02-10 DIAGNOSIS — I2510 Atherosclerotic heart disease of native coronary artery without angina pectoris: Secondary | ICD-10-CM | POA: Diagnosis not present

## 2024-02-10 DIAGNOSIS — G20A1 Parkinson's disease without dyskinesia, without mention of fluctuations: Secondary | ICD-10-CM | POA: Diagnosis not present

## 2024-02-10 DIAGNOSIS — E1165 Type 2 diabetes mellitus with hyperglycemia: Secondary | ICD-10-CM | POA: Diagnosis not present

## 2024-02-10 DIAGNOSIS — I10 Essential (primary) hypertension: Secondary | ICD-10-CM | POA: Diagnosis not present

## 2024-02-10 DIAGNOSIS — Z9181 History of falling: Secondary | ICD-10-CM | POA: Diagnosis not present

## 2024-02-10 DIAGNOSIS — G20C Parkinsonism, unspecified: Secondary | ICD-10-CM | POA: Diagnosis not present

## 2024-02-10 DIAGNOSIS — E538 Deficiency of other specified B group vitamins: Secondary | ICD-10-CM | POA: Diagnosis not present

## 2024-02-10 DIAGNOSIS — Z87891 Personal history of nicotine dependence: Secondary | ICD-10-CM | POA: Diagnosis not present

## 2024-02-10 DIAGNOSIS — Z7984 Long term (current) use of oral hypoglycemic drugs: Secondary | ICD-10-CM | POA: Diagnosis not present

## 2024-02-13 ENCOUNTER — Telehealth: Payer: Self-pay | Admitting: Neurology

## 2024-02-13 DIAGNOSIS — R296 Repeated falls: Secondary | ICD-10-CM | POA: Diagnosis not present

## 2024-02-13 DIAGNOSIS — I714 Abdominal aortic aneurysm, without rupture, unspecified: Secondary | ICD-10-CM | POA: Diagnosis not present

## 2024-02-13 DIAGNOSIS — Z9181 History of falling: Secondary | ICD-10-CM | POA: Diagnosis not present

## 2024-02-13 DIAGNOSIS — E1151 Type 2 diabetes mellitus with diabetic peripheral angiopathy without gangrene: Secondary | ICD-10-CM | POA: Diagnosis not present

## 2024-02-13 DIAGNOSIS — Z87891 Personal history of nicotine dependence: Secondary | ICD-10-CM | POA: Diagnosis not present

## 2024-02-13 DIAGNOSIS — E538 Deficiency of other specified B group vitamins: Secondary | ICD-10-CM | POA: Diagnosis not present

## 2024-02-13 DIAGNOSIS — Z7984 Long term (current) use of oral hypoglycemic drugs: Secondary | ICD-10-CM | POA: Diagnosis not present

## 2024-02-13 DIAGNOSIS — G20C Parkinsonism, unspecified: Secondary | ICD-10-CM | POA: Diagnosis not present

## 2024-02-13 DIAGNOSIS — E1165 Type 2 diabetes mellitus with hyperglycemia: Secondary | ICD-10-CM | POA: Diagnosis not present

## 2024-02-13 DIAGNOSIS — I10 Essential (primary) hypertension: Secondary | ICD-10-CM | POA: Diagnosis not present

## 2024-02-13 DIAGNOSIS — I2510 Atherosclerotic heart disease of native coronary artery without angina pectoris: Secondary | ICD-10-CM | POA: Diagnosis not present

## 2024-02-13 DIAGNOSIS — G20A1 Parkinson's disease without dyskinesia, without mention of fluctuations: Secondary | ICD-10-CM | POA: Diagnosis not present

## 2024-02-13 NOTE — Telephone Encounter (Signed)
 Pt's wife called in and left a message with the access nurse. She stated the pt is getting worse with his Parkinson's. He is having difficulty walking. He is continuing PT, but doesn't have any medication for Parkinson's and he is worsening.

## 2024-02-13 NOTE — Telephone Encounter (Signed)
 Pls let wife know I don't have any openings currently but our PA has openings on April 23 at 8am and April 25 at 8am so he can be seen earlier. If she agrees, pls schedule 60-mins with Abraham Hoffmann, thanks

## 2024-02-14 DIAGNOSIS — Z9181 History of falling: Secondary | ICD-10-CM | POA: Diagnosis not present

## 2024-02-14 DIAGNOSIS — R296 Repeated falls: Secondary | ICD-10-CM | POA: Diagnosis not present

## 2024-02-14 DIAGNOSIS — G20A1 Parkinson's disease without dyskinesia, without mention of fluctuations: Secondary | ICD-10-CM | POA: Diagnosis not present

## 2024-02-14 DIAGNOSIS — E1151 Type 2 diabetes mellitus with diabetic peripheral angiopathy without gangrene: Secondary | ICD-10-CM | POA: Diagnosis not present

## 2024-02-14 DIAGNOSIS — E538 Deficiency of other specified B group vitamins: Secondary | ICD-10-CM | POA: Diagnosis not present

## 2024-02-14 DIAGNOSIS — G20C Parkinsonism, unspecified: Secondary | ICD-10-CM | POA: Diagnosis not present

## 2024-02-14 DIAGNOSIS — Z87891 Personal history of nicotine dependence: Secondary | ICD-10-CM | POA: Diagnosis not present

## 2024-02-14 DIAGNOSIS — I10 Essential (primary) hypertension: Secondary | ICD-10-CM | POA: Diagnosis not present

## 2024-02-14 DIAGNOSIS — I714 Abdominal aortic aneurysm, without rupture, unspecified: Secondary | ICD-10-CM | POA: Diagnosis not present

## 2024-02-14 DIAGNOSIS — E1165 Type 2 diabetes mellitus with hyperglycemia: Secondary | ICD-10-CM | POA: Diagnosis not present

## 2024-02-14 DIAGNOSIS — Z7984 Long term (current) use of oral hypoglycemic drugs: Secondary | ICD-10-CM | POA: Diagnosis not present

## 2024-02-14 DIAGNOSIS — I2510 Atherosclerotic heart disease of native coronary artery without angina pectoris: Secondary | ICD-10-CM | POA: Diagnosis not present

## 2024-02-14 NOTE — Telephone Encounter (Signed)
 Spoke to the patient wife advised of Dr. Jaymes Mew note.  Wife chose 4/23 at 8 am.

## 2024-02-15 DIAGNOSIS — G20A1 Parkinson's disease without dyskinesia, without mention of fluctuations: Secondary | ICD-10-CM | POA: Diagnosis not present

## 2024-02-15 DIAGNOSIS — I10 Essential (primary) hypertension: Secondary | ICD-10-CM | POA: Diagnosis not present

## 2024-02-15 DIAGNOSIS — E1151 Type 2 diabetes mellitus with diabetic peripheral angiopathy without gangrene: Secondary | ICD-10-CM | POA: Diagnosis not present

## 2024-02-15 DIAGNOSIS — I2510 Atherosclerotic heart disease of native coronary artery without angina pectoris: Secondary | ICD-10-CM | POA: Diagnosis not present

## 2024-02-15 DIAGNOSIS — R296 Repeated falls: Secondary | ICD-10-CM | POA: Diagnosis not present

## 2024-02-15 DIAGNOSIS — I714 Abdominal aortic aneurysm, without rupture, unspecified: Secondary | ICD-10-CM | POA: Diagnosis not present

## 2024-02-15 DIAGNOSIS — E1165 Type 2 diabetes mellitus with hyperglycemia: Secondary | ICD-10-CM | POA: Diagnosis not present

## 2024-02-15 DIAGNOSIS — Z87891 Personal history of nicotine dependence: Secondary | ICD-10-CM | POA: Diagnosis not present

## 2024-02-15 DIAGNOSIS — E538 Deficiency of other specified B group vitamins: Secondary | ICD-10-CM | POA: Diagnosis not present

## 2024-02-15 DIAGNOSIS — G20C Parkinsonism, unspecified: Secondary | ICD-10-CM | POA: Diagnosis not present

## 2024-02-15 DIAGNOSIS — Z7984 Long term (current) use of oral hypoglycemic drugs: Secondary | ICD-10-CM | POA: Diagnosis not present

## 2024-02-15 DIAGNOSIS — Z9181 History of falling: Secondary | ICD-10-CM | POA: Diagnosis not present

## 2024-02-16 DIAGNOSIS — E1151 Type 2 diabetes mellitus with diabetic peripheral angiopathy without gangrene: Secondary | ICD-10-CM | POA: Diagnosis not present

## 2024-02-16 DIAGNOSIS — R296 Repeated falls: Secondary | ICD-10-CM | POA: Diagnosis not present

## 2024-02-16 DIAGNOSIS — Z7984 Long term (current) use of oral hypoglycemic drugs: Secondary | ICD-10-CM | POA: Diagnosis not present

## 2024-02-16 DIAGNOSIS — G20C Parkinsonism, unspecified: Secondary | ICD-10-CM | POA: Diagnosis not present

## 2024-02-16 DIAGNOSIS — Z9181 History of falling: Secondary | ICD-10-CM | POA: Diagnosis not present

## 2024-02-16 DIAGNOSIS — I10 Essential (primary) hypertension: Secondary | ICD-10-CM | POA: Diagnosis not present

## 2024-02-16 DIAGNOSIS — Z87891 Personal history of nicotine dependence: Secondary | ICD-10-CM | POA: Diagnosis not present

## 2024-02-16 DIAGNOSIS — I714 Abdominal aortic aneurysm, without rupture, unspecified: Secondary | ICD-10-CM | POA: Diagnosis not present

## 2024-02-16 DIAGNOSIS — E538 Deficiency of other specified B group vitamins: Secondary | ICD-10-CM | POA: Diagnosis not present

## 2024-02-16 DIAGNOSIS — E1165 Type 2 diabetes mellitus with hyperglycemia: Secondary | ICD-10-CM | POA: Diagnosis not present

## 2024-02-16 DIAGNOSIS — G20A1 Parkinson's disease without dyskinesia, without mention of fluctuations: Secondary | ICD-10-CM | POA: Diagnosis not present

## 2024-02-16 DIAGNOSIS — I2510 Atherosclerotic heart disease of native coronary artery without angina pectoris: Secondary | ICD-10-CM | POA: Diagnosis not present

## 2024-02-20 ENCOUNTER — Telehealth: Payer: Self-pay

## 2024-02-20 DIAGNOSIS — G20A1 Parkinson's disease without dyskinesia, without mention of fluctuations: Secondary | ICD-10-CM | POA: Diagnosis not present

## 2024-02-20 DIAGNOSIS — E1151 Type 2 diabetes mellitus with diabetic peripheral angiopathy without gangrene: Secondary | ICD-10-CM

## 2024-02-20 DIAGNOSIS — I2510 Atherosclerotic heart disease of native coronary artery without angina pectoris: Secondary | ICD-10-CM | POA: Diagnosis not present

## 2024-02-20 DIAGNOSIS — I10 Essential (primary) hypertension: Secondary | ICD-10-CM | POA: Diagnosis not present

## 2024-02-20 DIAGNOSIS — Z9181 History of falling: Secondary | ICD-10-CM | POA: Diagnosis not present

## 2024-02-20 DIAGNOSIS — Z7984 Long term (current) use of oral hypoglycemic drugs: Secondary | ICD-10-CM | POA: Diagnosis not present

## 2024-02-20 DIAGNOSIS — R296 Repeated falls: Secondary | ICD-10-CM | POA: Diagnosis not present

## 2024-02-20 DIAGNOSIS — Z87891 Personal history of nicotine dependence: Secondary | ICD-10-CM | POA: Diagnosis not present

## 2024-02-20 DIAGNOSIS — G20C Parkinsonism, unspecified: Secondary | ICD-10-CM | POA: Diagnosis not present

## 2024-02-20 DIAGNOSIS — I714 Abdominal aortic aneurysm, without rupture, unspecified: Secondary | ICD-10-CM | POA: Diagnosis not present

## 2024-02-20 DIAGNOSIS — E1165 Type 2 diabetes mellitus with hyperglycemia: Secondary | ICD-10-CM | POA: Diagnosis not present

## 2024-02-20 DIAGNOSIS — E538 Deficiency of other specified B group vitamins: Secondary | ICD-10-CM | POA: Diagnosis not present

## 2024-02-20 MED ORDER — ONETOUCH ULTRA VI STRP: ORAL_STRIP | 0 refills | Status: DC

## 2024-02-20 MED ORDER — ONETOUCH ULTRASOFT LANCETS MISC: 3 refills | Status: AC

## 2024-02-20 NOTE — Telephone Encounter (Signed)
 Copied from CRM [id number]. Topic: Clinical - Prescription Issue >> Feb 20, 2024  9:37 AM Deaijah H wrote: Reason for CRM: Patients wife called in requesting to speak with Dr. Particia Bolus nurse regarding patient prescription Lancets (ONETOUCH ULTRASOFT) lancets. Stated he was told he'll receive 50 but got 25. Please call [phone]

## 2024-02-20 NOTE — Addendum Note (Signed)
 Addended by: Anselm Basset L on: 02/20/2024 11:10 AM   Modules accepted: Orders

## 2024-02-20 NOTE — Telephone Encounter (Signed)
Rx sent for test strips.

## 2024-02-21 ENCOUNTER — Telehealth: Payer: Self-pay

## 2024-02-21 DIAGNOSIS — G20C Parkinsonism, unspecified: Secondary | ICD-10-CM | POA: Diagnosis not present

## 2024-02-21 DIAGNOSIS — Z7984 Long term (current) use of oral hypoglycemic drugs: Secondary | ICD-10-CM | POA: Diagnosis not present

## 2024-02-21 DIAGNOSIS — I10 Essential (primary) hypertension: Secondary | ICD-10-CM | POA: Diagnosis not present

## 2024-02-21 DIAGNOSIS — E1151 Type 2 diabetes mellitus with diabetic peripheral angiopathy without gangrene: Secondary | ICD-10-CM | POA: Diagnosis not present

## 2024-02-21 DIAGNOSIS — Z87891 Personal history of nicotine dependence: Secondary | ICD-10-CM | POA: Diagnosis not present

## 2024-02-21 DIAGNOSIS — G20A1 Parkinson's disease without dyskinesia, without mention of fluctuations: Secondary | ICD-10-CM | POA: Diagnosis not present

## 2024-02-21 DIAGNOSIS — R296 Repeated falls: Secondary | ICD-10-CM | POA: Diagnosis not present

## 2024-02-21 DIAGNOSIS — Z9181 History of falling: Secondary | ICD-10-CM | POA: Diagnosis not present

## 2024-02-21 DIAGNOSIS — I2510 Atherosclerotic heart disease of native coronary artery without angina pectoris: Secondary | ICD-10-CM | POA: Diagnosis not present

## 2024-02-21 DIAGNOSIS — E538 Deficiency of other specified B group vitamins: Secondary | ICD-10-CM | POA: Diagnosis not present

## 2024-02-21 DIAGNOSIS — I714 Abdominal aortic aneurysm, without rupture, unspecified: Secondary | ICD-10-CM | POA: Diagnosis not present

## 2024-02-21 DIAGNOSIS — E1165 Type 2 diabetes mellitus with hyperglycemia: Secondary | ICD-10-CM | POA: Diagnosis not present

## 2024-02-21 NOTE — Telephone Encounter (Signed)
 Copied from CRM [id number]. Topic: Clinical - Home Health Verbal Orders >> Feb 21, 2024  3:50 PM Adaysia C wrote: Caller/Agency: [facility] Callback Number: [phone] Service Requested: Occupational Therapy Frequency: 2 times a week for one week and 1 time a week for one week Any new concerns about the patient? No

## 2024-02-22 DIAGNOSIS — Z9181 History of falling: Secondary | ICD-10-CM | POA: Diagnosis not present

## 2024-02-22 DIAGNOSIS — R296 Repeated falls: Secondary | ICD-10-CM | POA: Diagnosis not present

## 2024-02-22 DIAGNOSIS — E1151 Type 2 diabetes mellitus with diabetic peripheral angiopathy without gangrene: Secondary | ICD-10-CM | POA: Diagnosis not present

## 2024-02-22 DIAGNOSIS — I10 Essential (primary) hypertension: Secondary | ICD-10-CM | POA: Diagnosis not present

## 2024-02-22 DIAGNOSIS — I714 Abdominal aortic aneurysm, without rupture, unspecified: Secondary | ICD-10-CM | POA: Diagnosis not present

## 2024-02-22 DIAGNOSIS — Z7984 Long term (current) use of oral hypoglycemic drugs: Secondary | ICD-10-CM | POA: Diagnosis not present

## 2024-02-22 DIAGNOSIS — Z87891 Personal history of nicotine dependence: Secondary | ICD-10-CM | POA: Diagnosis not present

## 2024-02-22 DIAGNOSIS — E538 Deficiency of other specified B group vitamins: Secondary | ICD-10-CM | POA: Diagnosis not present

## 2024-02-22 DIAGNOSIS — E1165 Type 2 diabetes mellitus with hyperglycemia: Secondary | ICD-10-CM | POA: Diagnosis not present

## 2024-02-22 DIAGNOSIS — G20A1 Parkinson's disease without dyskinesia, without mention of fluctuations: Secondary | ICD-10-CM | POA: Diagnosis not present

## 2024-02-22 DIAGNOSIS — I2510 Atherosclerotic heart disease of native coronary artery without angina pectoris: Secondary | ICD-10-CM | POA: Diagnosis not present

## 2024-02-22 DIAGNOSIS — G20C Parkinsonism, unspecified: Secondary | ICD-10-CM | POA: Diagnosis not present

## 2024-02-22 NOTE — Telephone Encounter (Signed)
 Left detailed message informing Moira Andrews of verbal orders

## 2024-02-23 ENCOUNTER — Telehealth: Payer: Self-pay | Admitting: *Deleted

## 2024-02-23 NOTE — Telephone Encounter (Unsigned)
 Copied from CRM [id number]. Topic: Clinical - Home Health Verbal Orders >> Feb 23, 2024  9:52 AM Ovid Blow wrote: Caller/Agency: [facility] Callback Number: [phone] Service Requested: Physical Therapy Frequency: Extend 1x a week for 2 weeks Any new concerns about the patient? No

## 2024-02-24 ENCOUNTER — Ambulatory Visit: Admitting: Physician Assistant

## 2024-02-24 NOTE — Telephone Encounter (Signed)
 Amy informed of Ok for verbal order

## 2024-02-25 ENCOUNTER — Other Ambulatory Visit: Payer: Self-pay | Admitting: Family Medicine

## 2024-02-28 DIAGNOSIS — E1151 Type 2 diabetes mellitus with diabetic peripheral angiopathy without gangrene: Secondary | ICD-10-CM | POA: Diagnosis not present

## 2024-02-28 DIAGNOSIS — Z7984 Long term (current) use of oral hypoglycemic drugs: Secondary | ICD-10-CM | POA: Diagnosis not present

## 2024-02-28 DIAGNOSIS — G20C Parkinsonism, unspecified: Secondary | ICD-10-CM | POA: Diagnosis not present

## 2024-02-28 DIAGNOSIS — E1165 Type 2 diabetes mellitus with hyperglycemia: Secondary | ICD-10-CM | POA: Diagnosis not present

## 2024-02-28 DIAGNOSIS — R296 Repeated falls: Secondary | ICD-10-CM | POA: Diagnosis not present

## 2024-02-28 DIAGNOSIS — Z9181 History of falling: Secondary | ICD-10-CM | POA: Diagnosis not present

## 2024-02-28 DIAGNOSIS — G20A1 Parkinson's disease without dyskinesia, without mention of fluctuations: Secondary | ICD-10-CM | POA: Diagnosis not present

## 2024-02-28 DIAGNOSIS — I714 Abdominal aortic aneurysm, without rupture, unspecified: Secondary | ICD-10-CM | POA: Diagnosis not present

## 2024-02-28 DIAGNOSIS — Z87891 Personal history of nicotine dependence: Secondary | ICD-10-CM | POA: Diagnosis not present

## 2024-02-28 DIAGNOSIS — E538 Deficiency of other specified B group vitamins: Secondary | ICD-10-CM | POA: Diagnosis not present

## 2024-02-28 DIAGNOSIS — I10 Essential (primary) hypertension: Secondary | ICD-10-CM | POA: Diagnosis not present

## 2024-02-28 DIAGNOSIS — I2510 Atherosclerotic heart disease of native coronary artery without angina pectoris: Secondary | ICD-10-CM | POA: Diagnosis not present

## 2024-02-29 DIAGNOSIS — I10 Essential (primary) hypertension: Secondary | ICD-10-CM | POA: Diagnosis not present

## 2024-02-29 DIAGNOSIS — I714 Abdominal aortic aneurysm, without rupture, unspecified: Secondary | ICD-10-CM | POA: Diagnosis not present

## 2024-02-29 DIAGNOSIS — E1151 Type 2 diabetes mellitus with diabetic peripheral angiopathy without gangrene: Secondary | ICD-10-CM | POA: Diagnosis not present

## 2024-02-29 DIAGNOSIS — G20A1 Parkinson's disease without dyskinesia, without mention of fluctuations: Secondary | ICD-10-CM | POA: Diagnosis not present

## 2024-02-29 DIAGNOSIS — E1165 Type 2 diabetes mellitus with hyperglycemia: Secondary | ICD-10-CM | POA: Diagnosis not present

## 2024-02-29 DIAGNOSIS — G20C Parkinsonism, unspecified: Secondary | ICD-10-CM | POA: Diagnosis not present

## 2024-02-29 DIAGNOSIS — R296 Repeated falls: Secondary | ICD-10-CM | POA: Diagnosis not present

## 2024-02-29 DIAGNOSIS — E538 Deficiency of other specified B group vitamins: Secondary | ICD-10-CM | POA: Diagnosis not present

## 2024-02-29 DIAGNOSIS — Z9181 History of falling: Secondary | ICD-10-CM | POA: Diagnosis not present

## 2024-02-29 DIAGNOSIS — Z7984 Long term (current) use of oral hypoglycemic drugs: Secondary | ICD-10-CM | POA: Diagnosis not present

## 2024-02-29 DIAGNOSIS — I2510 Atherosclerotic heart disease of native coronary artery without angina pectoris: Secondary | ICD-10-CM | POA: Diagnosis not present

## 2024-02-29 DIAGNOSIS — Z87891 Personal history of nicotine dependence: Secondary | ICD-10-CM | POA: Diagnosis not present

## 2024-03-02 DIAGNOSIS — R296 Repeated falls: Secondary | ICD-10-CM | POA: Diagnosis not present

## 2024-03-02 DIAGNOSIS — Z9181 History of falling: Secondary | ICD-10-CM | POA: Diagnosis not present

## 2024-03-02 DIAGNOSIS — I2510 Atherosclerotic heart disease of native coronary artery without angina pectoris: Secondary | ICD-10-CM | POA: Diagnosis not present

## 2024-03-02 DIAGNOSIS — Z7984 Long term (current) use of oral hypoglycemic drugs: Secondary | ICD-10-CM | POA: Diagnosis not present

## 2024-03-02 DIAGNOSIS — G20C Parkinsonism, unspecified: Secondary | ICD-10-CM | POA: Diagnosis not present

## 2024-03-02 DIAGNOSIS — G20A1 Parkinson's disease without dyskinesia, without mention of fluctuations: Secondary | ICD-10-CM | POA: Diagnosis not present

## 2024-03-02 DIAGNOSIS — E1151 Type 2 diabetes mellitus with diabetic peripheral angiopathy without gangrene: Secondary | ICD-10-CM | POA: Diagnosis not present

## 2024-03-02 DIAGNOSIS — E1165 Type 2 diabetes mellitus with hyperglycemia: Secondary | ICD-10-CM | POA: Diagnosis not present

## 2024-03-02 DIAGNOSIS — I714 Abdominal aortic aneurysm, without rupture, unspecified: Secondary | ICD-10-CM | POA: Diagnosis not present

## 2024-03-02 DIAGNOSIS — I10 Essential (primary) hypertension: Secondary | ICD-10-CM | POA: Diagnosis not present

## 2024-03-02 DIAGNOSIS — E538 Deficiency of other specified B group vitamins: Secondary | ICD-10-CM | POA: Diagnosis not present

## 2024-03-02 DIAGNOSIS — Z87891 Personal history of nicotine dependence: Secondary | ICD-10-CM | POA: Diagnosis not present

## 2024-03-08 DIAGNOSIS — R296 Repeated falls: Secondary | ICD-10-CM | POA: Diagnosis not present

## 2024-03-08 DIAGNOSIS — E1165 Type 2 diabetes mellitus with hyperglycemia: Secondary | ICD-10-CM | POA: Diagnosis not present

## 2024-03-08 DIAGNOSIS — Z7984 Long term (current) use of oral hypoglycemic drugs: Secondary | ICD-10-CM | POA: Diagnosis not present

## 2024-03-08 DIAGNOSIS — G20C Parkinsonism, unspecified: Secondary | ICD-10-CM | POA: Diagnosis not present

## 2024-03-08 DIAGNOSIS — I2510 Atherosclerotic heart disease of native coronary artery without angina pectoris: Secondary | ICD-10-CM | POA: Diagnosis not present

## 2024-03-08 DIAGNOSIS — I10 Essential (primary) hypertension: Secondary | ICD-10-CM | POA: Diagnosis not present

## 2024-03-08 DIAGNOSIS — E1151 Type 2 diabetes mellitus with diabetic peripheral angiopathy without gangrene: Secondary | ICD-10-CM | POA: Diagnosis not present

## 2024-03-08 DIAGNOSIS — E538 Deficiency of other specified B group vitamins: Secondary | ICD-10-CM | POA: Diagnosis not present

## 2024-03-08 DIAGNOSIS — Z87891 Personal history of nicotine dependence: Secondary | ICD-10-CM | POA: Diagnosis not present

## 2024-03-08 DIAGNOSIS — Z9181 History of falling: Secondary | ICD-10-CM | POA: Diagnosis not present

## 2024-03-08 DIAGNOSIS — G20A1 Parkinson's disease without dyskinesia, without mention of fluctuations: Secondary | ICD-10-CM | POA: Diagnosis not present

## 2024-03-08 DIAGNOSIS — I714 Abdominal aortic aneurysm, without rupture, unspecified: Secondary | ICD-10-CM | POA: Diagnosis not present

## 2024-03-09 ENCOUNTER — Ambulatory Visit (INDEPENDENT_AMBULATORY_CARE_PROVIDER_SITE_OTHER): Admitting: Neurology

## 2024-03-09 ENCOUNTER — Encounter: Payer: Self-pay | Admitting: Neurology

## 2024-03-09 VITALS — BP 141/71 | HR 73 | Ht 67.0 in | Wt 128.2 lb

## 2024-03-09 DIAGNOSIS — E1165 Type 2 diabetes mellitus with hyperglycemia: Secondary | ICD-10-CM | POA: Diagnosis not present

## 2024-03-09 DIAGNOSIS — E1151 Type 2 diabetes mellitus with diabetic peripheral angiopathy without gangrene: Secondary | ICD-10-CM | POA: Diagnosis not present

## 2024-03-09 DIAGNOSIS — E538 Deficiency of other specified B group vitamins: Secondary | ICD-10-CM | POA: Diagnosis not present

## 2024-03-09 DIAGNOSIS — G3184 Mild cognitive impairment, so stated: Secondary | ICD-10-CM | POA: Diagnosis not present

## 2024-03-09 DIAGNOSIS — I2510 Atherosclerotic heart disease of native coronary artery without angina pectoris: Secondary | ICD-10-CM | POA: Diagnosis not present

## 2024-03-09 DIAGNOSIS — Z87891 Personal history of nicotine dependence: Secondary | ICD-10-CM | POA: Diagnosis not present

## 2024-03-09 DIAGNOSIS — I714 Abdominal aortic aneurysm, without rupture, unspecified: Secondary | ICD-10-CM | POA: Diagnosis not present

## 2024-03-09 DIAGNOSIS — Z7984 Long term (current) use of oral hypoglycemic drugs: Secondary | ICD-10-CM | POA: Diagnosis not present

## 2024-03-09 DIAGNOSIS — G20C Parkinsonism, unspecified: Secondary | ICD-10-CM | POA: Diagnosis not present

## 2024-03-09 DIAGNOSIS — Z9181 History of falling: Secondary | ICD-10-CM | POA: Diagnosis not present

## 2024-03-09 DIAGNOSIS — I10 Essential (primary) hypertension: Secondary | ICD-10-CM | POA: Diagnosis not present

## 2024-03-09 DIAGNOSIS — R296 Repeated falls: Secondary | ICD-10-CM | POA: Diagnosis not present

## 2024-03-09 DIAGNOSIS — G20A1 Parkinson's disease without dyskinesia, without mention of fluctuations: Secondary | ICD-10-CM | POA: Diagnosis not present

## 2024-03-09 MED ORDER — CARBIDOPA-LEVODOPA 25-100 MG PO TABS: ORAL_TABLET | ORAL | 6 refills | Status: DC

## 2024-03-09 MED ORDER — MEMANTINE HCL 10 MG PO TABS: 10.0000 mg | ORAL_TABLET | Freq: Two times a day (BID) | ORAL | 1 refills | Status: DC

## 2024-03-09 NOTE — Patient Instructions (Signed)
 Good to see you, I hope you start feeling better!  Start Carbidopa/Levodopa 25/100mg : take 1/2 tablet three times a day 30-60mins before meals for 1 week, then increase to 1 tablet three times a day before meals  2. Continue Memantine  10mg  twice a day  3. Continue PT  4. Follow-up in 2 months, call for any changes
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
## 2024-03-15 DIAGNOSIS — I10 Essential (primary) hypertension: Secondary | ICD-10-CM | POA: Diagnosis not present

## 2024-03-15 DIAGNOSIS — I714 Abdominal aortic aneurysm, without rupture, unspecified: Secondary | ICD-10-CM | POA: Diagnosis not present

## 2024-03-15 DIAGNOSIS — Z9181 History of falling: Secondary | ICD-10-CM | POA: Diagnosis not present

## 2024-03-15 DIAGNOSIS — E1165 Type 2 diabetes mellitus with hyperglycemia: Secondary | ICD-10-CM | POA: Diagnosis not present

## 2024-03-15 DIAGNOSIS — G20C Parkinsonism, unspecified: Secondary | ICD-10-CM | POA: Diagnosis not present

## 2024-03-15 DIAGNOSIS — E538 Deficiency of other specified B group vitamins: Secondary | ICD-10-CM | POA: Diagnosis not present

## 2024-03-15 DIAGNOSIS — R296 Repeated falls: Secondary | ICD-10-CM | POA: Diagnosis not present

## 2024-03-15 DIAGNOSIS — Z7984 Long term (current) use of oral hypoglycemic drugs: Secondary | ICD-10-CM | POA: Diagnosis not present

## 2024-03-15 DIAGNOSIS — E1151 Type 2 diabetes mellitus with diabetic peripheral angiopathy without gangrene: Secondary | ICD-10-CM | POA: Diagnosis not present

## 2024-03-15 DIAGNOSIS — Z87891 Personal history of nicotine dependence: Secondary | ICD-10-CM | POA: Diagnosis not present

## 2024-03-15 DIAGNOSIS — I2510 Atherosclerotic heart disease of native coronary artery without angina pectoris: Secondary | ICD-10-CM | POA: Diagnosis not present

## 2024-03-15 DIAGNOSIS — G20A1 Parkinson's disease without dyskinesia, without mention of fluctuations: Secondary | ICD-10-CM | POA: Diagnosis not present

## 2024-03-21 DIAGNOSIS — E538 Deficiency of other specified B group vitamins: Secondary | ICD-10-CM | POA: Diagnosis not present

## 2024-03-21 DIAGNOSIS — Z7984 Long term (current) use of oral hypoglycemic drugs: Secondary | ICD-10-CM | POA: Diagnosis not present

## 2024-03-21 DIAGNOSIS — E1151 Type 2 diabetes mellitus with diabetic peripheral angiopathy without gangrene: Secondary | ICD-10-CM | POA: Diagnosis not present

## 2024-03-21 DIAGNOSIS — I714 Abdominal aortic aneurysm, without rupture, unspecified: Secondary | ICD-10-CM | POA: Diagnosis not present

## 2024-03-21 DIAGNOSIS — E1165 Type 2 diabetes mellitus with hyperglycemia: Secondary | ICD-10-CM | POA: Diagnosis not present

## 2024-03-21 DIAGNOSIS — G20A1 Parkinson's disease without dyskinesia, without mention of fluctuations: Secondary | ICD-10-CM | POA: Diagnosis not present

## 2024-03-21 DIAGNOSIS — I10 Essential (primary) hypertension: Secondary | ICD-10-CM | POA: Diagnosis not present

## 2024-03-21 DIAGNOSIS — R296 Repeated falls: Secondary | ICD-10-CM | POA: Diagnosis not present

## 2024-03-21 DIAGNOSIS — Z9181 History of falling: Secondary | ICD-10-CM | POA: Diagnosis not present

## 2024-03-21 DIAGNOSIS — I2510 Atherosclerotic heart disease of native coronary artery without angina pectoris: Secondary | ICD-10-CM | POA: Diagnosis not present

## 2024-03-21 DIAGNOSIS — Z87891 Personal history of nicotine dependence: Secondary | ICD-10-CM | POA: Diagnosis not present

## 2024-03-23 DIAGNOSIS — Z87891 Personal history of nicotine dependence: Secondary | ICD-10-CM | POA: Diagnosis not present

## 2024-03-23 DIAGNOSIS — I10 Essential (primary) hypertension: Secondary | ICD-10-CM | POA: Diagnosis not present

## 2024-03-23 DIAGNOSIS — E538 Deficiency of other specified B group vitamins: Secondary | ICD-10-CM | POA: Diagnosis not present

## 2024-03-23 DIAGNOSIS — E1151 Type 2 diabetes mellitus with diabetic peripheral angiopathy without gangrene: Secondary | ICD-10-CM | POA: Diagnosis not present

## 2024-03-23 DIAGNOSIS — Z9181 History of falling: Secondary | ICD-10-CM | POA: Diagnosis not present

## 2024-03-23 DIAGNOSIS — I714 Abdominal aortic aneurysm, without rupture, unspecified: Secondary | ICD-10-CM | POA: Diagnosis not present

## 2024-03-23 DIAGNOSIS — G20A1 Parkinson's disease without dyskinesia, without mention of fluctuations: Secondary | ICD-10-CM | POA: Diagnosis not present

## 2024-03-23 DIAGNOSIS — I2510 Atherosclerotic heart disease of native coronary artery without angina pectoris: Secondary | ICD-10-CM | POA: Diagnosis not present

## 2024-03-23 DIAGNOSIS — Z7984 Long term (current) use of oral hypoglycemic drugs: Secondary | ICD-10-CM | POA: Diagnosis not present

## 2024-03-23 DIAGNOSIS — R296 Repeated falls: Secondary | ICD-10-CM | POA: Diagnosis not present

## 2024-03-23 DIAGNOSIS — E1165 Type 2 diabetes mellitus with hyperglycemia: Secondary | ICD-10-CM | POA: Diagnosis not present

## 2024-03-27 DIAGNOSIS — E1151 Type 2 diabetes mellitus with diabetic peripheral angiopathy without gangrene: Secondary | ICD-10-CM | POA: Diagnosis not present

## 2024-03-27 DIAGNOSIS — I714 Abdominal aortic aneurysm, without rupture, unspecified: Secondary | ICD-10-CM | POA: Diagnosis not present

## 2024-03-27 DIAGNOSIS — G20A1 Parkinson's disease without dyskinesia, without mention of fluctuations: Secondary | ICD-10-CM | POA: Diagnosis not present

## 2024-03-27 DIAGNOSIS — E1165 Type 2 diabetes mellitus with hyperglycemia: Secondary | ICD-10-CM | POA: Diagnosis not present

## 2024-03-27 DIAGNOSIS — E538 Deficiency of other specified B group vitamins: Secondary | ICD-10-CM | POA: Diagnosis not present

## 2024-03-27 DIAGNOSIS — Z87891 Personal history of nicotine dependence: Secondary | ICD-10-CM | POA: Diagnosis not present

## 2024-03-27 DIAGNOSIS — I10 Essential (primary) hypertension: Secondary | ICD-10-CM | POA: Diagnosis not present

## 2024-03-27 DIAGNOSIS — I2510 Atherosclerotic heart disease of native coronary artery without angina pectoris: Secondary | ICD-10-CM | POA: Diagnosis not present

## 2024-03-27 DIAGNOSIS — Z9181 History of falling: Secondary | ICD-10-CM | POA: Diagnosis not present

## 2024-03-27 DIAGNOSIS — Z7984 Long term (current) use of oral hypoglycemic drugs: Secondary | ICD-10-CM | POA: Diagnosis not present

## 2024-03-27 DIAGNOSIS — R296 Repeated falls: Secondary | ICD-10-CM | POA: Diagnosis not present

## 2024-03-29 DIAGNOSIS — I2510 Atherosclerotic heart disease of native coronary artery without angina pectoris: Secondary | ICD-10-CM | POA: Diagnosis not present

## 2024-03-29 DIAGNOSIS — E1165 Type 2 diabetes mellitus with hyperglycemia: Secondary | ICD-10-CM | POA: Diagnosis not present

## 2024-03-29 DIAGNOSIS — R296 Repeated falls: Secondary | ICD-10-CM | POA: Diagnosis not present

## 2024-03-29 DIAGNOSIS — E538 Deficiency of other specified B group vitamins: Secondary | ICD-10-CM | POA: Diagnosis not present

## 2024-03-29 DIAGNOSIS — I714 Abdominal aortic aneurysm, without rupture, unspecified: Secondary | ICD-10-CM | POA: Diagnosis not present

## 2024-03-29 DIAGNOSIS — E1151 Type 2 diabetes mellitus with diabetic peripheral angiopathy without gangrene: Secondary | ICD-10-CM | POA: Diagnosis not present

## 2024-03-29 DIAGNOSIS — I10 Essential (primary) hypertension: Secondary | ICD-10-CM | POA: Diagnosis not present

## 2024-03-29 DIAGNOSIS — Z7984 Long term (current) use of oral hypoglycemic drugs: Secondary | ICD-10-CM | POA: Diagnosis not present

## 2024-03-29 DIAGNOSIS — Z9181 History of falling: Secondary | ICD-10-CM | POA: Diagnosis not present

## 2024-03-29 DIAGNOSIS — G20A1 Parkinson's disease without dyskinesia, without mention of fluctuations: Secondary | ICD-10-CM | POA: Diagnosis not present

## 2024-03-29 DIAGNOSIS — Z87891 Personal history of nicotine dependence: Secondary | ICD-10-CM | POA: Diagnosis not present

## 2024-04-03 DIAGNOSIS — E1151 Type 2 diabetes mellitus with diabetic peripheral angiopathy without gangrene: Secondary | ICD-10-CM | POA: Diagnosis not present

## 2024-04-03 DIAGNOSIS — I10 Essential (primary) hypertension: Secondary | ICD-10-CM | POA: Diagnosis not present

## 2024-04-03 DIAGNOSIS — E538 Deficiency of other specified B group vitamins: Secondary | ICD-10-CM | POA: Diagnosis not present

## 2024-04-03 DIAGNOSIS — I2510 Atherosclerotic heart disease of native coronary artery without angina pectoris: Secondary | ICD-10-CM | POA: Diagnosis not present

## 2024-04-03 DIAGNOSIS — Z87891 Personal history of nicotine dependence: Secondary | ICD-10-CM | POA: Diagnosis not present

## 2024-04-03 DIAGNOSIS — I714 Abdominal aortic aneurysm, without rupture, unspecified: Secondary | ICD-10-CM | POA: Diagnosis not present

## 2024-04-03 DIAGNOSIS — G20A1 Parkinson's disease without dyskinesia, without mention of fluctuations: Secondary | ICD-10-CM | POA: Diagnosis not present

## 2024-04-03 DIAGNOSIS — Z7984 Long term (current) use of oral hypoglycemic drugs: Secondary | ICD-10-CM | POA: Diagnosis not present

## 2024-04-03 DIAGNOSIS — Z9181 History of falling: Secondary | ICD-10-CM | POA: Diagnosis not present

## 2024-04-03 DIAGNOSIS — E1165 Type 2 diabetes mellitus with hyperglycemia: Secondary | ICD-10-CM | POA: Diagnosis not present

## 2024-04-03 DIAGNOSIS — R296 Repeated falls: Secondary | ICD-10-CM | POA: Diagnosis not present

## 2024-04-05 DIAGNOSIS — I714 Abdominal aortic aneurysm, without rupture, unspecified: Secondary | ICD-10-CM | POA: Diagnosis not present

## 2024-04-05 DIAGNOSIS — E1151 Type 2 diabetes mellitus with diabetic peripheral angiopathy without gangrene: Secondary | ICD-10-CM | POA: Diagnosis not present

## 2024-04-05 DIAGNOSIS — I2510 Atherosclerotic heart disease of native coronary artery without angina pectoris: Secondary | ICD-10-CM | POA: Diagnosis not present

## 2024-04-05 DIAGNOSIS — R296 Repeated falls: Secondary | ICD-10-CM | POA: Diagnosis not present

## 2024-04-05 DIAGNOSIS — Z7984 Long term (current) use of oral hypoglycemic drugs: Secondary | ICD-10-CM | POA: Diagnosis not present

## 2024-04-05 DIAGNOSIS — E1165 Type 2 diabetes mellitus with hyperglycemia: Secondary | ICD-10-CM | POA: Diagnosis not present

## 2024-04-05 DIAGNOSIS — G20A1 Parkinson's disease without dyskinesia, without mention of fluctuations: Secondary | ICD-10-CM | POA: Diagnosis not present

## 2024-04-05 DIAGNOSIS — Z9181 History of falling: Secondary | ICD-10-CM | POA: Diagnosis not present

## 2024-04-05 DIAGNOSIS — E538 Deficiency of other specified B group vitamins: Secondary | ICD-10-CM | POA: Diagnosis not present

## 2024-04-05 DIAGNOSIS — I10 Essential (primary) hypertension: Secondary | ICD-10-CM | POA: Diagnosis not present

## 2024-04-05 DIAGNOSIS — Z87891 Personal history of nicotine dependence: Secondary | ICD-10-CM | POA: Diagnosis not present

## 2024-04-09 ENCOUNTER — Telehealth: Payer: Self-pay

## 2024-04-09 DIAGNOSIS — R296 Repeated falls: Secondary | ICD-10-CM | POA: Diagnosis not present

## 2024-04-09 DIAGNOSIS — G20A1 Parkinson's disease without dyskinesia, without mention of fluctuations: Secondary | ICD-10-CM | POA: Diagnosis not present

## 2024-04-09 DIAGNOSIS — E1151 Type 2 diabetes mellitus with diabetic peripheral angiopathy without gangrene: Secondary | ICD-10-CM | POA: Diagnosis not present

## 2024-04-09 DIAGNOSIS — Z7984 Long term (current) use of oral hypoglycemic drugs: Secondary | ICD-10-CM | POA: Diagnosis not present

## 2024-04-09 DIAGNOSIS — I714 Abdominal aortic aneurysm, without rupture, unspecified: Secondary | ICD-10-CM | POA: Diagnosis not present

## 2024-04-09 DIAGNOSIS — Z87891 Personal history of nicotine dependence: Secondary | ICD-10-CM | POA: Diagnosis not present

## 2024-04-09 DIAGNOSIS — E1165 Type 2 diabetes mellitus with hyperglycemia: Secondary | ICD-10-CM | POA: Diagnosis not present

## 2024-04-09 DIAGNOSIS — E538 Deficiency of other specified B group vitamins: Secondary | ICD-10-CM | POA: Diagnosis not present

## 2024-04-09 DIAGNOSIS — I2510 Atherosclerotic heart disease of native coronary artery without angina pectoris: Secondary | ICD-10-CM | POA: Diagnosis not present

## 2024-04-09 DIAGNOSIS — Z9181 History of falling: Secondary | ICD-10-CM | POA: Diagnosis not present

## 2024-04-09 DIAGNOSIS — I10 Essential (primary) hypertension: Secondary | ICD-10-CM | POA: Diagnosis not present

## 2024-04-09 NOTE — Telephone Encounter (Signed)
 Copied from CRM [id number]. Topic: Clinical - Home Health Verbal Orders >> Apr 09, 2024  4:03 PM Dewanda Foots wrote: Caller/Agency: Amy PT with [facility] Callback Number: [phone] Service Requested: Physical Therapy Frequency: 2w3 1w3 Any new concerns about the patient? No

## 2024-04-10 ENCOUNTER — Telehealth: Payer: Self-pay

## 2024-04-10 DIAGNOSIS — I10 Essential (primary) hypertension: Secondary | ICD-10-CM

## 2024-04-10 DIAGNOSIS — D649 Anemia, unspecified: Secondary | ICD-10-CM

## 2024-04-10 DIAGNOSIS — E1151 Type 2 diabetes mellitus with diabetic peripheral angiopathy without gangrene: Secondary | ICD-10-CM

## 2024-04-10 NOTE — Telephone Encounter (Signed)
 Copied from CRM [id number]. Topic: Clinical - Request for Lab/Test Order >> Apr 10, 2024  9:35 AM Arm[place] J wrote: Reason for CRM: Patient's wife is calling in to see if it would be possible to get orders in to check his Hemoglobin and kidneys before he has an appointment with a vascular clinic July 9th.

## 2024-04-10 NOTE — Addendum Note (Signed)
 Addended by: Aurelio Leer on: 04/10/2024 12:52 PM   Modules accepted: Orders

## 2024-04-10 NOTE — Telephone Encounter (Signed)
 Amy informed of VO

## 2024-04-10 NOTE — Telephone Encounter (Signed)
 Patient's wife aware and orders faxed to [facility]

## 2024-04-11 ENCOUNTER — Telehealth: Payer: Self-pay | Admitting: Neurology

## 2024-04-11 NOTE — Telephone Encounter (Signed)
 Pt wife called and states that she needs to speak with someone about his medication  carbidopa  levodopa 

## 2024-04-12 ENCOUNTER — Other Ambulatory Visit: Payer: Self-pay | Admitting: Family Medicine

## 2024-04-12 DIAGNOSIS — D649 Anemia, unspecified: Secondary | ICD-10-CM | POA: Diagnosis not present

## 2024-04-12 DIAGNOSIS — I10 Essential (primary) hypertension: Secondary | ICD-10-CM | POA: Diagnosis not present

## 2024-04-12 MED ORDER — CARBIDOPA-LEVODOPA 25-100 MG PO TABS: ORAL_TABLET | ORAL | 5 refills | Status: DC

## 2024-04-12 NOTE — Telephone Encounter (Signed)
 Pt is ok he was in the bathroom shaving he did not get hurt pt son came over helped get him up. He did not hit his head,

## 2024-04-12 NOTE — Telephone Encounter (Signed)
 Pt wife called he should be taken 1 tablet TID after the week titration,  Pt had a fall while I was on the phone with wife she rushed to get off the phone will call back to check on pt,

## 2024-04-13 ENCOUNTER — Telehealth: Payer: Self-pay

## 2024-04-13 ENCOUNTER — Other Ambulatory Visit: Payer: Self-pay | Admitting: Family Medicine

## 2024-04-13 ENCOUNTER — Encounter: Payer: Self-pay | Admitting: Family Medicine

## 2024-04-13 DIAGNOSIS — E1151 Type 2 diabetes mellitus with diabetic peripheral angiopathy without gangrene: Secondary | ICD-10-CM | POA: Diagnosis not present

## 2024-04-13 DIAGNOSIS — E538 Deficiency of other specified B group vitamins: Secondary | ICD-10-CM | POA: Diagnosis not present

## 2024-04-13 DIAGNOSIS — E1165 Type 2 diabetes mellitus with hyperglycemia: Secondary | ICD-10-CM | POA: Diagnosis not present

## 2024-04-13 DIAGNOSIS — Z87891 Personal history of nicotine dependence: Secondary | ICD-10-CM | POA: Diagnosis not present

## 2024-04-13 DIAGNOSIS — I10 Essential (primary) hypertension: Secondary | ICD-10-CM | POA: Diagnosis not present

## 2024-04-13 DIAGNOSIS — R296 Repeated falls: Secondary | ICD-10-CM | POA: Diagnosis not present

## 2024-04-13 DIAGNOSIS — Z9181 History of falling: Secondary | ICD-10-CM | POA: Diagnosis not present

## 2024-04-13 DIAGNOSIS — E785 Hyperlipidemia, unspecified: Secondary | ICD-10-CM

## 2024-04-13 DIAGNOSIS — I2510 Atherosclerotic heart disease of native coronary artery without angina pectoris: Secondary | ICD-10-CM | POA: Diagnosis not present

## 2024-04-13 DIAGNOSIS — I714 Abdominal aortic aneurysm, without rupture, unspecified: Secondary | ICD-10-CM | POA: Diagnosis not present

## 2024-04-13 DIAGNOSIS — Z7984 Long term (current) use of oral hypoglycemic drugs: Secondary | ICD-10-CM | POA: Diagnosis not present

## 2024-04-13 DIAGNOSIS — G20A1 Parkinson's disease without dyskinesia, without mention of fluctuations: Secondary | ICD-10-CM | POA: Diagnosis not present

## 2024-04-13 LAB — CBC WITH DIFFERENTIAL/PLATELET

## 2024-04-13 LAB — LAB REPORT - SCANNED: EGFR: 56

## 2024-04-13 NOTE — Telephone Encounter (Signed)
 Copied from CRM [id number]. Topic: General - Other >> Apr 13, 2024  3:07 PM Alysia Jumbo S wrote: Reason for CRM: Toy Freund with [facility] calling to report that patient had a fall on 04/11/24 with skin tear to RT upper arm, no pain.  Callback [phone]

## 2024-04-14 LAB — CBC WITH DIFFERENTIAL/PLATELET
Basos: 0 %
EOS (ABSOLUTE): 0 10*3/uL (ref 0.0–0.2)
Eos: 3 %
Hematocrit: 31 % — ABNORMAL LOW (ref 37.5–51.0)
Hemoglobin: 9.3 g/dL — ABNORMAL LOW (ref 13.0–17.7)
Immature Granulocytes: 0 %
Immature Granulocytes: 0 10*3/uL (ref 0.0–0.1)
Lymphs: 11 %
MCH: 28.5 pg (ref 26.6–33.0)
MCHC: 30 g/dL — ABNORMAL LOW (ref 31.5–35.7)
MCV: 95 fL (ref 79–97)
Monocytes Absolute: 0.2 10*3/uL (ref 0.0–0.4)
Monocytes Absolute: 0.4 10*3/uL (ref 0.1–0.9)
Monocytes: 7 %
Neutrophils Absolute: 0.7 10*3/uL (ref 0.7–3.1)
Neutrophils Absolute: 4.6 10*3/uL (ref 1.4–7.0)
Neutrophils: 79 %
Platelets: 222 10*3/uL (ref 150–450)
RBC: 3.26 x10E6/uL — ABNORMAL LOW (ref 4.14–5.80)
RDW: 13.6 % (ref 11.6–15.4)
WBC: 5.9 10*3/uL (ref 3.4–10.8)

## 2024-04-14 LAB — BASIC METABOLIC PANEL WITH GFR
BUN/Creatinine Ratio: 15 (ref 10–24)
BUN: 19 mg/dL (ref 8–27)
CO2: 23 mmol/L (ref 20–29)
Calcium: 9.4 mg/dL (ref 8.6–10.2)
Chloride: 103 mmol/L (ref 96–106)
Creatinine, Ser: 1.31 mg/dL — ABNORMAL HIGH (ref 0.76–1.27)
Glucose: 139 mg/dL — ABNORMAL HIGH (ref 70–99)
Potassium: 4.4 mmol/L (ref 3.5–5.2)
Sodium: 142 mmol/L (ref 134–144)
eGFR: 56 mL/min/{1.73_m2} — ABNORMAL LOW (ref >59)

## 2024-04-15 ENCOUNTER — Ambulatory Visit: Payer: Self-pay | Admitting: Family Medicine

## 2024-04-15 DIAGNOSIS — D649 Anemia, unspecified: Secondary | ICD-10-CM

## 2024-04-16 NOTE — Telephone Encounter (Signed)
 Patient's wife informed of the message below and voiced understanding. Patient's wife reported she will follow up with any concerns

## 2024-04-17 DIAGNOSIS — E1151 Type 2 diabetes mellitus with diabetic peripheral angiopathy without gangrene: Secondary | ICD-10-CM | POA: Diagnosis not present

## 2024-04-17 DIAGNOSIS — I10 Essential (primary) hypertension: Secondary | ICD-10-CM | POA: Diagnosis not present

## 2024-04-17 DIAGNOSIS — Z7984 Long term (current) use of oral hypoglycemic drugs: Secondary | ICD-10-CM | POA: Diagnosis not present

## 2024-04-17 DIAGNOSIS — E538 Deficiency of other specified B group vitamins: Secondary | ICD-10-CM | POA: Diagnosis not present

## 2024-04-17 DIAGNOSIS — R296 Repeated falls: Secondary | ICD-10-CM | POA: Diagnosis not present

## 2024-04-17 DIAGNOSIS — Z87891 Personal history of nicotine dependence: Secondary | ICD-10-CM | POA: Diagnosis not present

## 2024-04-17 DIAGNOSIS — E1165 Type 2 diabetes mellitus with hyperglycemia: Secondary | ICD-10-CM | POA: Diagnosis not present

## 2024-04-17 DIAGNOSIS — G20A1 Parkinson's disease without dyskinesia, without mention of fluctuations: Secondary | ICD-10-CM | POA: Diagnosis not present

## 2024-04-17 DIAGNOSIS — I2510 Atherosclerotic heart disease of native coronary artery without angina pectoris: Secondary | ICD-10-CM | POA: Diagnosis not present

## 2024-04-17 DIAGNOSIS — I714 Abdominal aortic aneurysm, without rupture, unspecified: Secondary | ICD-10-CM | POA: Diagnosis not present

## 2024-04-17 DIAGNOSIS — Z9181 History of falling: Secondary | ICD-10-CM | POA: Diagnosis not present

## 2024-04-19 ENCOUNTER — Ambulatory Visit: Payer: Self-pay

## 2024-04-19 ENCOUNTER — Telehealth: Payer: Self-pay | Admitting: Urology

## 2024-04-19 DIAGNOSIS — E538 Deficiency of other specified B group vitamins: Secondary | ICD-10-CM | POA: Diagnosis not present

## 2024-04-19 DIAGNOSIS — E1151 Type 2 diabetes mellitus with diabetic peripheral angiopathy without gangrene: Secondary | ICD-10-CM | POA: Diagnosis not present

## 2024-04-19 DIAGNOSIS — Z7984 Long term (current) use of oral hypoglycemic drugs: Secondary | ICD-10-CM | POA: Diagnosis not present

## 2024-04-19 DIAGNOSIS — G20A1 Parkinson's disease without dyskinesia, without mention of fluctuations: Secondary | ICD-10-CM | POA: Diagnosis not present

## 2024-04-19 DIAGNOSIS — R296 Repeated falls: Secondary | ICD-10-CM | POA: Diagnosis not present

## 2024-04-19 DIAGNOSIS — I714 Abdominal aortic aneurysm, without rupture, unspecified: Secondary | ICD-10-CM | POA: Diagnosis not present

## 2024-04-19 DIAGNOSIS — Z9181 History of falling: Secondary | ICD-10-CM | POA: Diagnosis not present

## 2024-04-19 DIAGNOSIS — I2510 Atherosclerotic heart disease of native coronary artery without angina pectoris: Secondary | ICD-10-CM | POA: Diagnosis not present

## 2024-04-19 DIAGNOSIS — E1165 Type 2 diabetes mellitus with hyperglycemia: Secondary | ICD-10-CM | POA: Diagnosis not present

## 2024-04-19 DIAGNOSIS — Z87891 Personal history of nicotine dependence: Secondary | ICD-10-CM | POA: Diagnosis not present

## 2024-04-19 DIAGNOSIS — I10 Essential (primary) hypertension: Secondary | ICD-10-CM | POA: Diagnosis not present

## 2024-04-19 NOTE — Telephone Encounter (Signed)
 Copied from CRM #[id number]. Topic: Clinical - Red Word Triage >> Apr 19, 2024 12:14 PM Juluis Ok wrote: [facility] that prompted transfer to Nurse Triage:  Increased weakness, fall 04/19/2024 Hr 95-100   Reason for Disposition  [1] MODERATE weakness (i.e., interferes with work, school, normal activities) AND [2] persists > 3 days  Answer Assessment - Initial Assessment Questions 1. DESCRIPTION: Describe how you are feeling.     Feels weak 2. SEVERITY: How bad is it?  Can you stand and walk?   - MILD (0-3): Feels weak or tired, but does not interfere with work, school or normal activities.   - MODERATE (4-7): Able to stand and walk; weakness interferes with work, school, or normal activities.   - SEVERE (8-10): Unable to stand or walk; unable to do usual activities.     Moderate 3. ONSET: When did these symptoms begin? (e.g., hours, days, weeks, months)     2-3 days  4. CAUSE: What do you think is causing the weakness or fatigue? (e.g., not drinking enough fluids, medical problem, trouble sleeping)     Unsure  5. NEW MEDICINES:  Have you started on any new medicines recently? (e.g., opioid pain medicines, benzodiazepines, muscle relaxants, antidepressants, antihistamines, neuroleptics, beta blockers)     No 6. OTHER SYMPTOMS: Do you have any other symptoms? (e.g., chest pain, fever, cough, SOB, vomiting, diarrhea, bleeding, other areas of pain)     Frequent urination  Protocols used: Weakness (Generalized) and Fatigue-A-AH   FYI Only or Action Required?: FYI only for provider.  Patient was last seen in primary care on 01/02/2024 by Marquetta Sit, MD. Called Nurse Triage reporting Fatigue. Symptoms began 2-3 days ago. Interventions attempted: Rest, hydration, or home remedies. Symptoms are: gradually worsening.  Triage Disposition: See Physician Within 24 Hours  Patient/caregiver understands and will follow disposition?: Yes

## 2024-04-19 NOTE — Telephone Encounter (Signed)
 Opened in error

## 2024-04-20 ENCOUNTER — Ambulatory Visit (INDEPENDENT_AMBULATORY_CARE_PROVIDER_SITE_OTHER): Admitting: Family Medicine

## 2024-04-20 ENCOUNTER — Ambulatory Visit

## 2024-04-20 ENCOUNTER — Encounter: Payer: Self-pay | Admitting: Family Medicine

## 2024-04-20 VITALS — BP 146/60 | HR 72 | Temp 97.8°F | Wt 132.0 lb

## 2024-04-20 DIAGNOSIS — R059 Cough, unspecified: Secondary | ICD-10-CM

## 2024-04-20 DIAGNOSIS — D509 Iron deficiency anemia, unspecified: Secondary | ICD-10-CM | POA: Diagnosis not present

## 2024-04-20 DIAGNOSIS — R531 Weakness: Secondary | ICD-10-CM | POA: Diagnosis not present

## 2024-04-20 DIAGNOSIS — E538 Deficiency of other specified B group vitamins: Secondary | ICD-10-CM | POA: Diagnosis not present

## 2024-04-20 DIAGNOSIS — J3489 Other specified disorders of nose and nasal sinuses: Secondary | ICD-10-CM

## 2024-04-20 DIAGNOSIS — D649 Anemia, unspecified: Secondary | ICD-10-CM | POA: Diagnosis not present

## 2024-04-20 DIAGNOSIS — R300 Dysuria: Secondary | ICD-10-CM | POA: Diagnosis not present

## 2024-04-20 DIAGNOSIS — I10 Essential (primary) hypertension: Secondary | ICD-10-CM | POA: Diagnosis not present

## 2024-04-20 LAB — POC URINALSYSI DIPSTICK (AUTOMATED)
Bilirubin, UA: NEGATIVE
Blood, UA: NEGATIVE
Glucose, UA: NEGATIVE
Ketones, UA: NEGATIVE
Leukocytes, UA: NEGATIVE
Nitrite, UA: NEGATIVE
Protein, UA: POSITIVE — AB
Spec Grav, UA: 1.02 (ref 1.010–1.025)
Urobilinogen, UA: 0.2 U/dL
pH, UA: 6 (ref 5.0–8.0)

## 2024-04-20 MED ORDER — LEVOFLOXACIN 500 MG PO TABS: 500.0000 mg | ORAL_TABLET | Freq: Every day | ORAL | 0 refills | Status: AC

## 2024-04-20 NOTE — Progress Notes (Unsigned)
 Established Patient Office Visit  Subjective   Patient ID: Walter [name], male    DOB: 21-Jun-1946  Age: 78 y.o. MRN: [id number]  Chief Complaint  Patient presents with   Fatigue    HPI  {History (Optional):23778} Walter [name] is seen with some recent progressive weakness.  He has chronic problems including type 2 diabetes, hypertension, Parkinson's disease, peripheral vascular disease, GERD, history of B12 deficiency, history of anemia.  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  He had slow recovery.  Has been generally weak for some time.  Has also had some gradual weight loss though he is taking supplements with Premier protein and apparently eating fairly well.  Yesterday particularly had weakness to the point he had difficulty walking.  He has had extensive home health follow-up and PT. wife states he had somewhat of a weak cough but no reported true fever.  Did have temperature yesterday of 99.  Is also had some urine frequency but no burning with urination.  Denies any abdominal pain.  No nausea or vomiting.  Recent labs revealed hemoglobin 9.3 which is somewhat of a drop from previous.  We discussed getting some iron  studies.  He also has prior history of B12 deficiency.  He does have history of angiodysplasia of the small intestine which could potentially be a source of recent bleed.  He had colonoscopy and EGD back in May 2024  Nonhealing somewhat crusted skin lesion tip of right nose.  Has had skin cancers in the past.  He is pending follow-up with dermatology.  Diabetes has been well-controlled.  Last A1c was 5.6%.  Remains on metformin   Past Medical History:  Diagnosis Date   Allergy    Cancer (HCC)    Skin cancer   Cataract    Coronary artery disease    Diabetes mellitus    Diverticulosis    Esophageal stricture    GERD (gastroesophageal reflux disease)    History of hiatal hernia    Hyperlipidemia    Hypertension    Peripheral arterial disease (HCC)     Peripheral vascular disease (HCC)    Past Surgical History:  Procedure Laterality Date   ABDOMINAL AORTIC ENDOVASCULAR STENT GRAFT N/A 09/16/2023   Procedure: ABDOMINAL AORTA ANEURYSM STENT USING COOK 32 X STENT AND ENDO-ANCHORS X 7;  Surgeon: Adine Hoof, MD;  Location: [facility] OR;  Service: Vascular;  Laterality: N/A;   ANEURYSM COILING Right 09/16/2023   Procedure: COILING TO RIGHT COMMON ILIAC;  Surgeon: Adine Hoof, MD;  Location: [facility] OR;  Service: Vascular;  Laterality: Right;   BIOPSY  03/07/2023   Procedure: BIOPSY;  Surgeon: Nannette Babe, MD;  Location: [facility] ENDOSCOPY;  Service: Gastroenterology;;   BYPASS GRAFT FEMORAL-PERONEAL Left 09/16/2023   Procedure: LEFT EXTERNAL ILIAC TO COMMON FEMORAL BYPASS USING HEMASHIELD GOLD GRAFT;  Surgeon: Adine Hoof, MD;  Location: [facility] OR;  Service: Vascular;  Laterality: Left;   CATARACT EXTRACTION Bilateral    COLONOSCOPY WITH PROPOFOL  N/A 03/07/2023   Procedure: COLONOSCOPY WITH PROPOFOL ;  Surgeon: Nannette Babe, MD;  Location: [facility] ENDOSCOPY;  Service: Gastroenterology;  Laterality: N/A;   CORONARY ANGIOPLASTY WITH STENT PLACEMENT  2004   ENDARTERECTOMY Left 09/16/2023   Procedure: LEFT ILIAC ENDARTERECTOMY;  Surgeon: Adine Hoof, MD;  Location: [facility] OR;  Service: Vascular;  Laterality: Left;   ENDARTERECTOMY FEMORAL Bilateral 09/16/2023   Procedure: BILATERAL FEMORAL ENDARTERECTOMY;  Surgeon: Adine Hoof, MD;  Location: [facility] OR;  Service: Vascular;  Laterality: Bilateral;   ENTEROSCOPY N/A 09/28/2023   Procedure: ENTEROSCOPY;  Surgeon: Daina Drum, MD;  Location: [facility] ENDOSCOPY;  Service: Gastroenterology;  Laterality: N/A;   ESOPHAGOGASTRODUODENOSCOPY  06/07/2012   Procedure: ESOPHAGOGASTRODUODENOSCOPY (EGD);  Surgeon: Pietro Bridegroom, MD;  Location: [facility] ENDOSCOPY;  Service: Endoscopy;  Laterality: N/A;   ESOPHAGOGASTRODUODENOSCOPY (EGD) WITH PROPOFOL  N/A 03/07/2023    Procedure: ESOPHAGOGASTRODUODENOSCOPY (EGD) WITH PROPOFOL ;  Surgeon: Nannette Babe, MD;  Location: [facility] ENDOSCOPY;  Service: Gastroenterology;  Laterality: N/A;   FEMORAL-FEMORAL BYPASS GRAFT Bilateral 09/16/2023   Procedure: LEFT TO RIGHT BYPASS GRAFT FEMORAL-FEMORAL ARTERY USING HEMSHIELD GOLD GRAFT;  Surgeon: Adine Hoof, MD;  Location: [facility] OR;  Service: Vascular;  Laterality: Bilateral;   HOT HEMOSTASIS N/A 03/07/2023   Procedure: HOT HEMOSTASIS (ARGON PLASMA COAGULATION/BICAP);  Surgeon: Nannette Babe, MD;  Location: [facility] ENDOSCOPY;  Service: Gastroenterology;  Laterality: N/A;   HOT HEMOSTASIS N/A 09/28/2023   Procedure: HOT HEMOSTASIS (ARGON PLASMA COAGULATION;  Surgeon: Daina Drum, MD;  Location: [facility] ENDOSCOPY;  Service: Gastroenterology;  Laterality: N/A;   IR KYPHO LUMBAR INC FX REDUCE BONE BX UNI/BIL CANNULATION INC/IMAGING  03/02/2023   IVC FILTER INSERTION N/A 03/09/2023   Procedure: IVC FILTER INSERTION;  Surgeon: Kayla Part, MD;  Location: [facility] INVASIVE CV LAB;  Service: Cardiovascular;  Laterality: N/A;   POLYPECTOMY  03/07/2023   Procedure: POLYPECTOMY;  Surgeon: Nannette Babe, MD;  Location: [facility] ENDOSCOPY;  Service: Gastroenterology;;   SHOULDER SURGERY  11/01/2004    reports that he quit smoking about 24 years ago. His smoking use included cigarettes. He has never used smokeless tobacco. He reports that he does not drink alcohol and does not use drugs. family history includes Breast cancer in his mother; Healthy in his son and son; Parkinson's disease in his sister. No Known Allergies  Review of Systems  Constitutional:  Positive for malaise/fatigue and weight loss. Negative for chills.  Eyes:  Negative for blurred vision.  Respiratory:  Positive for cough. Negative for hemoptysis, sputum production and shortness of breath.   Cardiovascular:  Negative for chest pain.  Gastrointestinal:  Negative for abdominal pain, blood in stool, diarrhea, nausea and  vomiting.  Genitourinary:  Positive for frequency. Negative for hematuria.  Neurological:  Negative for dizziness, weakness and headaches.      Objective:     BP (!) 146/60 (BP Location: Left Arm, Patient Position: Sitting, Cuff Size: Normal)   Pulse 72   Temp 97.8 F (36.6 C) (Oral)   Wt 132 lb (59.9 kg)   SpO2 95%   BMI 20.67 kg/m  {Vitals History (Optional):23777}  Physical Exam Vitals reviewed.  Constitutional:      General: He is not in acute distress.    Appearance: He is not ill-appearing.   Cardiovascular:     Rate and Rhythm: Normal rate.  Pulmonary:     Comments: Breath sounds are clear throughout.  No wheezes or rales.  Musculoskeletal:     Right lower leg: No edema.     Left lower leg: No edema.   Neurological:     Mental Status: He is alert.      Results for orders placed or performed in visit on 04/20/24  POCT Urinalysis Dipstick (Automated)  Result Value Ref Range   Color, UA Yellow    Clarity, UA Clear    Glucose, UA Negative Negative   Bilirubin, UA Negative    Ketones, UA Negative    Spec Grav, UA 1.020 1.010 -  1.025   Blood, UA Negative    pH, UA 6.0 5.0 - 8.0   Protein, UA Positive (A) Negative   Urobilinogen, UA 0.2 0.2 or 1.0 E.U./dL   Nitrite, UA Negative    Leukocytes, UA Negative Negative    {Labs (Optional):23779}  The ASCVD Risk score (Arnett DK, et al., 2019) failed to calculate for the following reasons:   The valid total cholesterol range is 130 to 320 mg/dL    Assessment & Plan:   Problem List Items Addressed This Visit       Unprioritized   Anemia   Relevant Orders   CBC with Differential/Platelet   Iron , TIBC and Ferritin Panel   Iron  deficiency anemia   Relevant Orders   CBC with Differential/Platelet   Iron , TIBC and Ferritin Panel   Generalized weakness - Primary   B12 deficiency   Relevant Orders   Vitamin B12   Other Visit Diagnoses       Cough, unspecified type       Relevant Orders   DG Chest  2 View     Dysuria       Relevant Orders   POCT Urinalysis Dipstick (Automated) (Completed)     Hypertension, unspecified type         Lesion of nose         78 year old male with history of Parkinson's disease and multiple other chronic problems as above who presents with intermittent weakness particularly over the past couple days.  He has some urine frequency without burning.  Urine dipstick today shows no evidence for infection.  Chest x-ray obtained secondary to his cough and does show question of faint infiltrate right base.  This will be over read.  He has history of anemia with history of angiodysplasia of the small intestine.  -Check repeat labs as above - Start Levaquin 500 mg once daily for 7 days - Keep follow-up with dermatology to assess right nasal lesion which is likely either squamous cell or basal cell carcinoma. - If labs confirm iron  deficiency consider possible iron  infusion as he is already taking oral iron  replacement  No follow-ups on file.    Glean Lamy, MD

## 2024-04-21 LAB — CBC WITH DIFFERENTIAL/PLATELET
Absolute Lymphocytes: 663 {cells}/uL — ABNORMAL LOW (ref 850–3900)
Absolute Monocytes: 334 {cells}/uL (ref 200–950)
Basophils Absolute: 19 {cells}/uL (ref 0–200)
Basophils Relative: 0.4 %
Eosinophils Absolute: 99 {cells}/uL (ref 15–500)
Eosinophils Relative: 2.1 %
HCT: 29.8 % — ABNORMAL LOW (ref 38.5–50.0)
Hemoglobin: 9.3 g/dL — ABNORMAL LOW (ref 13.2–17.1)
MCH: 28.2 pg (ref 27.0–33.0)
MCHC: 31.2 g/dL — ABNORMAL LOW (ref 32.0–36.0)
MCV: 90.3 fL (ref 80.0–100.0)
MPV: 11.5 fL (ref 7.5–12.5)
Monocytes Relative: 7.1 %
Neutro Abs: 3586 {cells}/uL (ref 1500–7800)
Neutrophils Relative %: 76.3 %
Platelets: 184 10*3/uL (ref 140–400)
RBC: 3.3 10*6/uL — ABNORMAL LOW (ref 4.20–5.80)
RDW: 13.3 % (ref 11.0–15.0)
Total Lymphocyte: 14.1 %
WBC: 4.7 10*3/uL (ref 3.8–10.8)

## 2024-04-21 LAB — VITAMIN B12: Vitamin B-12: 993 pg/mL (ref 200–1100)

## 2024-04-21 LAB — BASIC METABOLIC PANEL WITH GFR
BUN/Creatinine Ratio: 26 — ABNORMAL HIGH (ref 10–24)
BUN: 29 mg/dL — ABNORMAL HIGH (ref 8–27)
CO2: 21 mmol/L (ref 20–29)
Calcium: 8.7 mg/dL (ref 8.6–10.2)
Chloride: 104 mmol/L (ref 96–106)
Creatinine, Ser: 1.13 mg/dL (ref 0.76–1.27)
Glucose: 141 mg/dL — ABNORMAL HIGH (ref 70–99)
Potassium: 4.2 mmol/L (ref 3.5–5.2)
Sodium: 140 mmol/L (ref 134–144)
eGFR: 67 mL/min/{1.73_m2} (ref >59)

## 2024-04-21 LAB — IRON,TIBC AND FERRITIN PANEL
%SAT: 11 % — ABNORMAL LOW (ref 20–48)
Ferritin: 27 ng/mL (ref 24–380)
Iron: 33 ug/dL — ABNORMAL LOW (ref 50–180)
TIBC: 291 ug/dL (ref 250–425)

## 2024-04-21 LAB — SPECIMEN STATUS REPORT

## 2024-04-22 ENCOUNTER — Ambulatory Visit: Payer: Self-pay | Admitting: Family Medicine

## 2024-04-23 ENCOUNTER — Other Ambulatory Visit: Payer: Self-pay | Admitting: Family Medicine

## 2024-04-23 DIAGNOSIS — I10 Essential (primary) hypertension: Secondary | ICD-10-CM | POA: Diagnosis not present

## 2024-04-23 DIAGNOSIS — Z9181 History of falling: Secondary | ICD-10-CM | POA: Diagnosis not present

## 2024-04-23 DIAGNOSIS — I714 Abdominal aortic aneurysm, without rupture, unspecified: Secondary | ICD-10-CM | POA: Diagnosis not present

## 2024-04-23 DIAGNOSIS — E1165 Type 2 diabetes mellitus with hyperglycemia: Secondary | ICD-10-CM | POA: Diagnosis not present

## 2024-04-23 DIAGNOSIS — D509 Iron deficiency anemia, unspecified: Secondary | ICD-10-CM | POA: Insufficient documentation

## 2024-04-23 DIAGNOSIS — I2510 Atherosclerotic heart disease of native coronary artery without angina pectoris: Secondary | ICD-10-CM | POA: Diagnosis not present

## 2024-04-23 DIAGNOSIS — Z87891 Personal history of nicotine dependence: Secondary | ICD-10-CM | POA: Diagnosis not present

## 2024-04-23 DIAGNOSIS — E538 Deficiency of other specified B group vitamins: Secondary | ICD-10-CM | POA: Diagnosis not present

## 2024-04-23 DIAGNOSIS — E1151 Type 2 diabetes mellitus with diabetic peripheral angiopathy without gangrene: Secondary | ICD-10-CM | POA: Diagnosis not present

## 2024-04-23 DIAGNOSIS — G20A1 Parkinson's disease without dyskinesia, without mention of fluctuations: Secondary | ICD-10-CM | POA: Diagnosis not present

## 2024-04-23 DIAGNOSIS — R296 Repeated falls: Secondary | ICD-10-CM | POA: Diagnosis not present

## 2024-04-23 DIAGNOSIS — Z7984 Long term (current) use of oral hypoglycemic drugs: Secondary | ICD-10-CM | POA: Diagnosis not present

## 2024-04-24 ENCOUNTER — Telehealth: Payer: Self-pay

## 2024-04-24 ENCOUNTER — Other Ambulatory Visit: Payer: Self-pay

## 2024-04-24 DIAGNOSIS — I739 Peripheral vascular disease, unspecified: Secondary | ICD-10-CM

## 2024-04-24 NOTE — Telephone Encounter (Signed)
 Auth Submission: NO AUTH NEEDED Site of care: Site of care: [facility] Payer: [employer] medicare Medication & CPT/J Code(s) submitted: Venofer (Iron  Sucrose) J1756 Diagnosis Code:  Route of submission (phone, fax, portal): portal Phone # Fax # Auth type: Buy/Bill PB Units/visits requested: 400mg  x 2doses Reference number:  Approval from: 04/24/24 to 10/31/24

## 2024-04-26 DIAGNOSIS — Z87891 Personal history of nicotine dependence: Secondary | ICD-10-CM | POA: Diagnosis not present

## 2024-04-26 DIAGNOSIS — E1165 Type 2 diabetes mellitus with hyperglycemia: Secondary | ICD-10-CM | POA: Diagnosis not present

## 2024-04-26 DIAGNOSIS — I714 Abdominal aortic aneurysm, without rupture, unspecified: Secondary | ICD-10-CM | POA: Diagnosis not present

## 2024-04-26 DIAGNOSIS — E1151 Type 2 diabetes mellitus with diabetic peripheral angiopathy without gangrene: Secondary | ICD-10-CM | POA: Diagnosis not present

## 2024-04-26 DIAGNOSIS — E538 Deficiency of other specified B group vitamins: Secondary | ICD-10-CM | POA: Diagnosis not present

## 2024-04-26 DIAGNOSIS — R296 Repeated falls: Secondary | ICD-10-CM | POA: Diagnosis not present

## 2024-04-26 DIAGNOSIS — I2510 Atherosclerotic heart disease of native coronary artery without angina pectoris: Secondary | ICD-10-CM | POA: Diagnosis not present

## 2024-04-26 DIAGNOSIS — Z7984 Long term (current) use of oral hypoglycemic drugs: Secondary | ICD-10-CM | POA: Diagnosis not present

## 2024-04-26 DIAGNOSIS — I10 Essential (primary) hypertension: Secondary | ICD-10-CM | POA: Diagnosis not present

## 2024-04-26 DIAGNOSIS — Z9181 History of falling: Secondary | ICD-10-CM | POA: Diagnosis not present

## 2024-04-26 DIAGNOSIS — G20A1 Parkinson's disease without dyskinesia, without mention of fluctuations: Secondary | ICD-10-CM | POA: Diagnosis not present

## 2024-04-30 ENCOUNTER — Encounter: Attending: Family Medicine | Admitting: Internal Medicine

## 2024-04-30 VITALS — BP 150/75 | HR 66 | Temp 99.0°F | Resp 16

## 2024-04-30 DIAGNOSIS — D509 Iron deficiency anemia, unspecified: Secondary | ICD-10-CM | POA: Diagnosis not present

## 2024-04-30 MED ORDER — ACETAMINOPHEN 325 MG PO TABS
650.0000 mg | ORAL_TABLET | Freq: Once | ORAL | Status: AC
  Administered 2024-04-30: 650 mg via ORAL

## 2024-04-30 MED ORDER — IRON SUCROSE 400 MG IVPB - SIMPLE MED
400.0000 mg | Status: DC
  Filled 2024-04-30: qty 270

## 2024-04-30 MED ORDER — SODIUM CHLORIDE 0.9 % IV SOLN
400.0000 mg | Freq: Once | INTRAVENOUS | Status: AC
  Administered 2024-04-30: 400 mg via INTRAVENOUS
  Filled 2024-04-30: qty 20

## 2024-04-30 MED ORDER — DIPHENHYDRAMINE HCL 25 MG PO CAPS
25.0000 mg | ORAL_CAPSULE | Freq: Once | ORAL | Status: AC
  Administered 2024-04-30: 25 mg via ORAL

## 2024-04-30 NOTE — Progress Notes (Signed)
 Diagnosis: Iron  Deficiency Anemia  Provider:  Micheal, Bruce,MD   Procedure: IV Infusion  IV Type: Peripheral, IV Location: L Antecubital  Venofer (Iron  Sucrose), Dose: 400 mg  Infusion Start Time: 0939  Infusion Stop Time: 1240  Post Infusion IV Care: Observation period completed  Discharge: Condition: Good, Destination: Home . AVS Provided  Performed by:  Blanca Selinda SAUNDERS, LPN

## 2024-05-01 DIAGNOSIS — D485 Neoplasm of uncertain behavior of skin: Secondary | ICD-10-CM | POA: Diagnosis not present

## 2024-05-01 DIAGNOSIS — L570 Actinic keratosis: Secondary | ICD-10-CM | POA: Diagnosis not present

## 2024-05-01 DIAGNOSIS — C44311 Basal cell carcinoma of skin of nose: Secondary | ICD-10-CM | POA: Diagnosis not present

## 2024-05-01 DIAGNOSIS — L905 Scar conditions and fibrosis of skin: Secondary | ICD-10-CM | POA: Diagnosis not present

## 2024-05-07 DIAGNOSIS — Z7984 Long term (current) use of oral hypoglycemic drugs: Secondary | ICD-10-CM | POA: Diagnosis not present

## 2024-05-07 DIAGNOSIS — I714 Abdominal aortic aneurysm, without rupture, unspecified: Secondary | ICD-10-CM | POA: Diagnosis not present

## 2024-05-07 DIAGNOSIS — Z9181 History of falling: Secondary | ICD-10-CM | POA: Diagnosis not present

## 2024-05-07 DIAGNOSIS — E538 Deficiency of other specified B group vitamins: Secondary | ICD-10-CM | POA: Diagnosis not present

## 2024-05-07 DIAGNOSIS — R296 Repeated falls: Secondary | ICD-10-CM | POA: Diagnosis not present

## 2024-05-07 DIAGNOSIS — E1151 Type 2 diabetes mellitus with diabetic peripheral angiopathy without gangrene: Secondary | ICD-10-CM | POA: Diagnosis not present

## 2024-05-07 DIAGNOSIS — G20A1 Parkinson's disease without dyskinesia, without mention of fluctuations: Secondary | ICD-10-CM | POA: Diagnosis not present

## 2024-05-07 DIAGNOSIS — I10 Essential (primary) hypertension: Secondary | ICD-10-CM | POA: Diagnosis not present

## 2024-05-07 DIAGNOSIS — E1165 Type 2 diabetes mellitus with hyperglycemia: Secondary | ICD-10-CM | POA: Diagnosis not present

## 2024-05-07 DIAGNOSIS — I2510 Atherosclerotic heart disease of native coronary artery without angina pectoris: Secondary | ICD-10-CM | POA: Diagnosis not present

## 2024-05-07 DIAGNOSIS — Z87891 Personal history of nicotine dependence: Secondary | ICD-10-CM | POA: Diagnosis not present

## 2024-05-09 ENCOUNTER — Ambulatory Visit (INDEPENDENT_AMBULATORY_CARE_PROVIDER_SITE_OTHER): Admitting: Vascular Surgery

## 2024-05-09 ENCOUNTER — Encounter (HOSPITAL_COMMUNITY): Payer: Medicare HMO

## 2024-05-09 ENCOUNTER — Other Ambulatory Visit (HOSPITAL_COMMUNITY): Payer: Medicare HMO

## 2024-05-09 ENCOUNTER — Other Ambulatory Visit: Payer: Self-pay | Admitting: Vascular Surgery

## 2024-05-09 ENCOUNTER — Ambulatory Visit (HOSPITAL_BASED_OUTPATIENT_CLINIC_OR_DEPARTMENT_OTHER)
Admission: RE | Admit: 2024-05-09 | Discharge: 2024-05-09 | Disposition: A | Discharge status: Home / Self Care | Source: Ambulatory Visit | Attending: Vascular Surgery

## 2024-05-09 ENCOUNTER — Ambulatory Visit: Payer: Medicare HMO | Admitting: Vascular Surgery

## 2024-05-09 ENCOUNTER — Ambulatory Visit (HOSPITAL_COMMUNITY)
Admission: RE | Admit: 2024-05-09 | Discharge: 2024-05-09 | Disposition: A | Discharge status: Home / Self Care | Source: Ambulatory Visit | Attending: Vascular Surgery | Admitting: Vascular Surgery

## 2024-05-09 ENCOUNTER — Encounter: Payer: Self-pay | Admitting: Vascular Surgery

## 2024-05-09 VITALS — BP 156/69 | HR 63 | Temp 98.1°F | Ht 67.0 in | Wt 132.0 lb

## 2024-05-09 DIAGNOSIS — I739 Peripheral vascular disease, unspecified: Secondary | ICD-10-CM

## 2024-05-09 DIAGNOSIS — I714 Abdominal aortic aneurysm, without rupture, unspecified: Secondary | ICD-10-CM | POA: Insufficient documentation

## 2024-05-09 LAB — VAS US ABI WITH/WO TBI
Left ABI: 1.05
Right ABI: 1.1

## 2024-05-09 NOTE — Progress Notes (Signed)
 Patient ID: Walter [name], male   DOB: Jun 17, 1946, 78 y.o.   MRN: [id number]  Reason for Consult: Follow-up   Referred by Micheal Wolm ORN, MD  Subjective:     HPI:  Walter [name] is a 78 y.o. male history of abdominal aortic aneurysm repair with AUI and left common iliac artery to left common femoral artery bypass and left to right femorofemoral bypass.  Patient has been weak with concern for ongoing GI bleed.  He is here today with EVAR and femorofemoral duplexes.  Patient does not really have any complaints but per the wife he has been losing weight and not been very active.  Past Medical History:  Diagnosis Date   Allergy    Cancer (HCC)    Skin cancer   Cataract    Coronary artery disease    Diabetes mellitus    Diverticulosis    Esophageal stricture    GERD (gastroesophageal reflux disease)    History of hiatal hernia    Hyperlipidemia    Hypertension    Peripheral arterial disease (HCC)    Peripheral vascular disease (HCC)    Family History  Problem Relation Age of Onset   Breast cancer Mother    Healthy Son    Healthy Son    Parkinson's disease Sister    Colon cancer Neg Hx    Esophageal cancer Neg Hx    Rectal cancer Neg Hx    Stomach cancer Neg Hx    Past Surgical History:  Procedure Laterality Date   ABDOMINAL AORTIC ENDOVASCULAR STENT GRAFT N/A 09/16/2023   Procedure: ABDOMINAL AORTA ANEURYSM STENT USING COOK 32 X STENT AND ENDO-ANCHORS X 7;  Surgeon: Sheree Penne Bruckner, MD;  Location: [facility] OR;  Service: Vascular;  Laterality: N/A;   ANEURYSM COILING Right 09/16/2023   Procedure: COILING TO RIGHT COMMON ILIAC;  Surgeon: Sheree Penne Bruckner, MD;  Location: [facility] OR;  Service: Vascular;  Laterality: Right;   BIOPSY  03/07/2023   Procedure: BIOPSY;  Surgeon: Albertus Gordy HERO, MD;  Location: [facility] ENDOSCOPY;  Service: Gastroenterology;;   BYPASS GRAFT FEMORAL-PERONEAL Left 09/16/2023   Procedure: LEFT EXTERNAL ILIAC TO COMMON FEMORAL  BYPASS USING HEMASHIELD GOLD GRAFT;  Surgeon: Sheree Penne Bruckner, MD;  Location: [facility] OR;  Service: Vascular;  Laterality: Left;   CATARACT EXTRACTION Bilateral    COLONOSCOPY WITH PROPOFOL  N/A 03/07/2023   Procedure: COLONOSCOPY WITH PROPOFOL ;  Surgeon: Albertus Gordy HERO, MD;  Location: [facility] ENDOSCOPY;  Service: Gastroenterology;  Laterality: N/A;   CORONARY ANGIOPLASTY WITH STENT PLACEMENT  2004   ENDARTERECTOMY Left 09/16/2023   Procedure: LEFT ILIAC ENDARTERECTOMY;  Surgeon: Sheree Penne Bruckner, MD;  Location: [facility] OR;  Service: Vascular;  Laterality: Left;   ENDARTERECTOMY FEMORAL Bilateral 09/16/2023   Procedure: BILATERAL FEMORAL ENDARTERECTOMY;  Surgeon: Sheree Penne Bruckner, MD;  Location: [facility] OR;  Service: Vascular;  Laterality: Bilateral;   ENTEROSCOPY N/A 09/28/2023   Procedure: ENTEROSCOPY;  Surgeon: Federico Rosario BROCKS, MD;  Location: [facility] ENDOSCOPY;  Service: Gastroenterology;  Laterality: N/A;   ESOPHAGOGASTRODUODENOSCOPY  06/07/2012   Procedure: ESOPHAGOGASTRODUODENOSCOPY (EGD);  Surgeon: Princella HERO Nida, MD;  Location: [facility] ENDOSCOPY;  Service: Endoscopy;  Laterality: N/A;   ESOPHAGOGASTRODUODENOSCOPY (EGD) WITH PROPOFOL  N/A 03/07/2023   Procedure: ESOPHAGOGASTRODUODENOSCOPY (EGD) WITH PROPOFOL ;  Surgeon: Albertus Gordy HERO, MD;  Location: [facility] ENDOSCOPY;  Service: Gastroenterology;  Laterality: N/A;   FEMORAL-FEMORAL BYPASS GRAFT Bilateral 09/16/2023   Procedure: LEFT TO RIGHT BYPASS GRAFT FEMORAL-FEMORAL ARTERY USING HEMSHIELD GOLD GRAFT;  Surgeon: Sheree Penne Bruckner, MD;  Location: [facility] OR;  Service: Vascular;  Laterality: Bilateral;   HOT HEMOSTASIS N/A 03/07/2023   Procedure: HOT HEMOSTASIS (ARGON PLASMA COAGULATION/BICAP);  Surgeon: Albertus Gordy HERO, MD;  Location: [facility] ENDOSCOPY;  Service: Gastroenterology;  Laterality: N/A;   HOT HEMOSTASIS N/A 09/28/2023   Procedure: HOT HEMOSTASIS (ARGON PLASMA COAGULATION;  Surgeon: Federico Rosario BROCKS, MD;  Location: [facility] ENDOSCOPY;  Service:  Gastroenterology;  Laterality: N/A;   IR KYPHO LUMBAR INC FX REDUCE BONE BX UNI/BIL CANNULATION INC/IMAGING  03/02/2023   IVC FILTER INSERTION N/A 03/09/2023   Procedure: IVC FILTER INSERTION;  Surgeon: Lanis Fonda BRAVO, MD;  Location: [facility] INVASIVE CV LAB;  Service: Cardiovascular;  Laterality: N/A;   POLYPECTOMY  03/07/2023   Procedure: POLYPECTOMY;  Surgeon: Albertus Gordy HERO, MD;  Location: [facility] ENDOSCOPY;  Service: Gastroenterology;;   SHOULDER SURGERY  11/01/2004    Short Social History:  Social History   Tobacco Use   Smoking status: Former    Current packs/day: 0.00    Types: Cigarettes    Quit date: 11/02/1999    Years since quitting: 24.5   Smokeless tobacco: Never  Substance Use Topics   Alcohol use: No    No Known Allergies  Current Outpatient Medications  Medication Sig Dispense Refill   acetaminophen  (TYLENOL ) 325 MG tablet Take 1-2 tablets (325-650 mg total) by mouth every 4 (four) hours as needed for mild pain (pain score 1-3).     aspirin  EC 81 MG tablet Take 1 tablet (81 mg total) by mouth daily. Swallow whole.     atorvastatin  (LIPITOR) 40 MG tablet Take 1 tablet by mouth once daily 90 tablet 0   Blood Glucose Monitoring Suppl (ONE TOUCH ULTRA 2) w/Device KIT 1 kit by Does not apply route daily. 1 kit 0   calcium  carbonate (TUMS - DOSED IN MG ELEMENTAL CALCIUM ) 500 MG chewable tablet Chew 2 tablets (400 mg of elemental calcium  total) by mouth 2 (two) times daily with a meal. 150 tablet 0   carbidopa -levodopa  (SINEMET ) 25-100 MG tablet Take 1 table three times a day before meals 90 tablet 5   cyanocobalamin  2000 MCG tablet Take 1 tablet (2,000 mcg total) by mouth daily. 30 tablet 0   docusate sodium  (COLACE) 100 MG capsule Take 1 capsule (100 mg total) by mouth daily.     Ferrous Sulfate  (IRON ) 325 (65 Fe) MG TABS Take 1 tablet (325 mg total) by mouth daily. 90 tablet 1   Lancets (ONETOUCH ULTRASOFT) lancets Check blood sugars once daily. Dx: E11.51 100 each 3    loratadine (ALLERGY RELIEF) 10 MG tablet Take 10 mg by mouth daily.     losartan  (COZAAR ) 25 MG tablet Take 1 tablet (25 mg total) by mouth at bedtime. 90 tablet 3   memantine  (NAMENDA ) 10 MG tablet Take 1 tablet (10 mg total) by mouth 2 (two) times daily. 180 tablet 1   metFORMIN  (GLUCOPHAGE ) 500 MG tablet Take 1 tablet (500 mg total) by mouth 2 (two) times daily with a meal. 180 tablet 1   Multiple Vitamin (MULTIVITAMIN WITH MINERALS) TABS tablet Take 1 tablet by mouth daily.     ondansetron  (ZOFRAN -ODT) 8 MG disintegrating tablet Take 1 tablet (8 mg total) by mouth every 8 (eight) hours as needed for nausea or vomiting. 20 tablet 0   ONETOUCH ULTRA test strip USE 1 STRIP TO CHECK GLUCOSE TWICE DAILY 100 each 0   pantoprazole  (PROTONIX ) 40 MG tablet Take 1 tablet by mouth twice  daily 60 tablet 5   No current facility-administered medications for this visit.    Review of Systems  Constitutional: Positive for unexpected weight change.  HENT: HENT negative.  Eyes: Eyes negative.  Respiratory: Respiratory negative.  GI: Positive for blood in stool.  Skin: Skin negative.  Neurological: Positive for focal weakness.  Hematologic: Hematologic/lymphatic negative.  Psychiatric: Psychiatric negative.        Objective:  Objective   Vitals:   05/09/24 1144  BP: (!) 156/69  Pulse: 63  Temp: 98.1 F (36.7 C)  SpO2: 95%  Weight: 132 lb (59.9 kg)  Height: 5' 7 (1.702 m)   Body mass index is 20.67 kg/m.  Physical Exam Constitutional:      Appearance: He is ill-appearing.  HENT:     Head: Normocephalic.     Mouth/Throat:     Mouth: Mucous membranes are moist.  Eyes:     Pupils: Pupils are equal, round, and reactive to light.  Cardiovascular:     Pulses:          Femoral pulses are 2+ on the right side and 2+ on the left side.      Dorsalis pedis pulses are 2+ on the right side and 1+ on the left side.  Abdominal:     General: Abdomen is flat.  Musculoskeletal:     Comments:  Well-healed bilateral groin incisions     Data: Abdominal Aorta Findings:  +--------+-------+----------+----------+--------+--------+--------+  LocationAP (cm)Trans (cm)PSV (cm/s)WaveformThrombusComments  +--------+-------+----------+----------+--------+--------+--------+  Proximal2.00  2.00      62                                  +--------+-------+----------+----------+--------+--------+--------+  Mid    3.40   3.40      18                                  +--------+-------+----------+----------+--------+--------+--------+  Distal 5.30   5.80      19                                  +--------+-------+----------+----------+--------+--------+--------+     IVC/Iliac Findings:  +--------+------+--------+--------+   IVC   PatentThrombusComments  +--------+------+--------+--------+  IVC Proxpatent                  +--------+------+--------+--------+         Endovascular Aortic Repair (EVAR):  +----------+---------------+----------------+-----------------+------------  ----+           Diameter AP    Diameter Trans  Velocities       Comments                     (cm)           (cm)            (cm/sec)                            +----------+---------------+----------------+-----------------+------------  ----+  Aorta    5.30           5.80            19                                  +----------+---------------+----------------+-----------------+------------  ----+  Right Limb                                                known right                                                                  common and                                                                   external  iliac                                                              artery  occlusion  +----------+---------------+----------------+-----------------+------------  ----+  Left Limb 1.50           1.60             151                                 +----------+---------------+----------------+-----------------+------------  ----+   +-------------+--------------------------------------+  Endoleak TypePossible type II endoleak vs artifact.  +-------------+--------------------------------------+   Left Graft #1:  +--------------------+--------+--------+---------+-------------------+                     PSV cm/sStenosisWaveform Comments             +--------------------+--------+--------+---------+-------------------+  Inflow             65              triphasicdampened, turbulent  +--------------------+--------+--------+---------+-------------------+  Proximal Anastomosis162             biphasic                      +--------------------+--------+--------+---------+-------------------+  Proximal Graft      167             biphasic                      +--------------------+--------+--------+---------+-------------------+  Mid Graft           128             biphasic turbulent            +--------------------+--------+--------+---------+-------------------+  Distal Graft        80              biphasic                      +--------------------+--------+--------+---------+-------------------+  Distal Anastomosis  68              biphasic                      +--------------------+--------+--------+---------+-------------------+  Outflow            76              biphasic                      +--------------------+--------+--------+---------+-------------------+     Summary:  Stenosis:  Patent left common iliac artery to common femoral artery bypass graft  without evidence of stenosis.   Possible type II endoleak.   Fem Fem Graft: Left to Right  +------------------+--------+---------------+-----------+---------+                   PSV cm/sStenosis       Waveform   Comments    +------------------+--------+---------------+-----------+---------+  Inflow           82                     biphasic              +------------------+--------+---------------+-----------+---------+  Prox anastomosis  142                    multiphasic           +------------------+--------+---------------+-----------+---------+  Proximal graft    129                    triphasic             +------------------+--------+---------------+-----------+---------+  Mid graft         61                     biphasic              +------------------+--------+---------------+-----------+---------+  Distal graft      56                     multiphasic           +------------------+--------+---------------+-----------+---------+  Distal anastomosis204     50-70% stenosisbiphasic              +------------------+--------+---------------+-----------+---------+  Outflow          285     50-74% stenosisbiphasic   turbulent  +------------------+--------+---------------+-----------+---------+   Patent left to right femoral bypass graft with elevated velocities at the  distal anastomosis and outflow consistent with 50-70% stenosis.     Summary:  Left: Patent left to right femoral bypass graft with elevated velocities  at the distal anastomosis and outflow consistent with 50-70% stenosis.    ABI Findings:  +---------+------------------+-----+-----------+--------+  Right   Rt Pressure (mmHg)IndexWaveform   Comment   +---------+------------------+-----+-----------+--------+  Brachial 172                                         +---------+------------------+-----+-----------+--------+  PTA     189               1.10 biphasic             +---------+------------------+-----+-----------+--------+  DP      182               1.06 multiphasic          +---------+------------------+-----+-----------+--------+  Great Toe162               0.94  Normal               +---------+------------------+-----+-----------+--------+   +---------+------------------+-----+-----------+-------+  Left    Lt Pressure (mmHg)IndexWaveform   Comment  +---------+------------------+-----+-----------+-------+  Brachial 165                                        +---------+------------------+-----+-----------+-------+  PTA     170               0.99 multiphasic         +---------+------------------+-----+-----------+-------+  DP      180               1.05 multiphasic         +---------+------------------+-----+-----------+-------+  Great Toe175               1.02 Abnormal            +---------+------------------+-----+-----------+-------+   +-------+-----------+-----------+------------+------------+  ABI/TBIToday's ABIToday's TBIPrevious ABIPrevious TBI  +-------+-----------+-----------+------------+------------+  Right 1.10       .94        .82         .59           +-------+-----------+-----------+------------+------------+  Left  1.05       1.02       1.10        .65           +-------+-----------+-----------+------------+------------+         Right ABIs and bilateral TBIs appear increased compared to prior study on  02/24/2023. Left ABIs appear essentially unchanged compared to prior study  on 02/24/2023.    Summary:  Right: Resting right ankle-brachial index is within normal range. The  right toe-brachial index is normal.   Left: Resting left ankle-brachial index is within normal range. The left  toe-brachial index is normal.      Assessment/Plan:     78 year old male with history as above with concern for possible endoleak on his EVAR duplex today and elevated velocities at the right limb of the femorofemoral bypass however all of his pulses are palpable and his abdominal aortic aneurysm remains stable in size from most recent CT scan.  As such I have recommended continued  surveillance.  Given his ongoing weakness is reasonable to see him back in 1 year unless he is having further issues.  Will plan to repeat noninvasive studies at that time.     Penne Lonni Colorado MD [facility]

## 2024-05-10 ENCOUNTER — Other Ambulatory Visit

## 2024-05-14 ENCOUNTER — Encounter: Attending: Family Medicine

## 2024-05-14 VITALS — BP 156/71 | HR 71 | Temp 98.4°F | Resp 18

## 2024-05-14 DIAGNOSIS — D509 Iron deficiency anemia, unspecified: Secondary | ICD-10-CM | POA: Insufficient documentation

## 2024-05-14 MED ORDER — SODIUM CHLORIDE 0.9 % IV SOLN
400.0000 mg | INTRAVENOUS | Status: DC
  Administered 2024-05-14: 400 mg via INTRAVENOUS
  Filled 2024-05-14: qty 20

## 2024-05-14 MED ORDER — ACETAMINOPHEN 325 MG PO TABS
650.0000 mg | ORAL_TABLET | Freq: Once | ORAL | Status: AC
  Administered 2024-05-14: 650 mg via ORAL

## 2024-05-14 MED ORDER — DIPHENHYDRAMINE HCL 25 MG PO CAPS
25.0000 mg | ORAL_CAPSULE | Freq: Once | ORAL | Status: AC
  Administered 2024-05-14: 25 mg via ORAL

## 2024-05-14 NOTE — Progress Notes (Signed)
 Diagnosis: Iron  Deficiency Anemia  Provider:  Micheal Pin, MD    Procedure: IV Infusion  IV Type: Peripheral, IV Location: L Antecubital  Venofer  (Iron  Sucrose), Dose: 400 mg  Infusion Start Time: 0925  Infusion Stop Time: 1202  Post Infusion IV Care: Observation period completed and Peripheral IV Discontinued  Discharge: Condition: Good, Destination: Home . AVS Declined  Performed by:  Meribeth Vitug G Pilkington-Burchett, RN

## 2024-05-15 DIAGNOSIS — I10 Essential (primary) hypertension: Secondary | ICD-10-CM | POA: Diagnosis not present

## 2024-05-15 DIAGNOSIS — R296 Repeated falls: Secondary | ICD-10-CM | POA: Diagnosis not present

## 2024-05-15 DIAGNOSIS — Z7984 Long term (current) use of oral hypoglycemic drugs: Secondary | ICD-10-CM | POA: Diagnosis not present

## 2024-05-15 DIAGNOSIS — Z9181 History of falling: Secondary | ICD-10-CM | POA: Diagnosis not present

## 2024-05-15 DIAGNOSIS — I714 Abdominal aortic aneurysm, without rupture, unspecified: Secondary | ICD-10-CM | POA: Diagnosis not present

## 2024-05-15 DIAGNOSIS — E1165 Type 2 diabetes mellitus with hyperglycemia: Secondary | ICD-10-CM | POA: Diagnosis not present

## 2024-05-15 DIAGNOSIS — E1151 Type 2 diabetes mellitus with diabetic peripheral angiopathy without gangrene: Secondary | ICD-10-CM | POA: Diagnosis not present

## 2024-05-15 DIAGNOSIS — E538 Deficiency of other specified B group vitamins: Secondary | ICD-10-CM | POA: Diagnosis not present

## 2024-05-15 DIAGNOSIS — Z87891 Personal history of nicotine dependence: Secondary | ICD-10-CM | POA: Diagnosis not present

## 2024-05-15 DIAGNOSIS — I2510 Atherosclerotic heart disease of native coronary artery without angina pectoris: Secondary | ICD-10-CM | POA: Diagnosis not present

## 2024-05-15 DIAGNOSIS — G20A1 Parkinson's disease without dyskinesia, without mention of fluctuations: Secondary | ICD-10-CM | POA: Diagnosis not present

## 2024-05-16 ENCOUNTER — Telehealth: Payer: Self-pay | Admitting: *Deleted

## 2024-05-16 NOTE — Transitions of Care (Post Inpatient/ED Visit) (Signed)
   05/16/2024  Name: Walter [name] MRN: [id number] DOB: 08-Nov-1945  Today's TOC FU Call Status: Sherran patient's wife the patient has not been in the hospital, I apologized and informed Mrs Deutscher this call was in error.     Patient's Name and Date of Birth confirmed.  Transition Care Management Follow-up Telephone Call    Items Reviewed:N/A    Medications Reviewed Today: Medications Reviewed Today   Medications were not reviewed in this encounter     Home Care and Equipment/Supplies:N/A    Functional Questionnaire:N/A    Follow up appointments reviewed:      SIGNATURE:Addelynn Batte Arlean Christyne LATHER

## 2024-05-22 ENCOUNTER — Ambulatory Visit (INDEPENDENT_AMBULATORY_CARE_PROVIDER_SITE_OTHER): Admitting: Neurology

## 2024-05-22 ENCOUNTER — Encounter: Payer: Self-pay | Admitting: Neurology

## 2024-05-22 VITALS — BP 150/70 | HR 78 | Ht 67.0 in | Wt 134.0 lb

## 2024-05-22 DIAGNOSIS — G20A1 Parkinson's disease without dyskinesia, without mention of fluctuations: Secondary | ICD-10-CM

## 2024-05-22 MED ORDER — CARBIDOPA-LEVODOPA 25-100 MG PO TABS: ORAL_TABLET | ORAL | 3 refills | Status: DC
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
## 2024-05-22 NOTE — Patient Instructions (Signed)
 Good to see you!  Increase Carbidopa /Levodopa  25/100mg : take 2 tablets three times a day before meals  2. Continue Memantine  10mg  twice a day  3. Continue daily exercises  4. Follow-up in 3 months, call for any changes

## 2024-05-23 ENCOUNTER — Telehealth: Payer: Self-pay | Admitting: Neurology

## 2024-05-23 ENCOUNTER — Ambulatory Visit: Payer: Self-pay

## 2024-05-23 ENCOUNTER — Other Ambulatory Visit: Payer: Self-pay

## 2024-05-23 ENCOUNTER — Encounter (HOSPITAL_COMMUNITY): Payer: Self-pay | Admitting: Emergency Medicine

## 2024-05-23 ENCOUNTER — Emergency Department (HOSPITAL_COMMUNITY)
Admission: EM | Admit: 2024-05-23 | Discharge: 2024-05-23 | Disposition: A | Discharge status: Home / Self Care | Attending: Emergency Medicine | Admitting: Emergency Medicine

## 2024-05-23 ENCOUNTER — Emergency Department (HOSPITAL_COMMUNITY)

## 2024-05-23 DIAGNOSIS — G319 Degenerative disease of nervous system, unspecified: Secondary | ICD-10-CM | POA: Diagnosis not present

## 2024-05-23 DIAGNOSIS — G20A1 Parkinson's disease without dyskinesia, without mention of fluctuations: Secondary | ICD-10-CM | POA: Diagnosis not present

## 2024-05-23 DIAGNOSIS — R531 Weakness: Secondary | ICD-10-CM | POA: Diagnosis not present

## 2024-05-23 DIAGNOSIS — R059 Cough, unspecified: Secondary | ICD-10-CM | POA: Diagnosis not present

## 2024-05-23 DIAGNOSIS — I700 Atherosclerosis of aorta: Secondary | ICD-10-CM | POA: Insufficient documentation

## 2024-05-23 DIAGNOSIS — I10 Essential (primary) hypertension: Secondary | ICD-10-CM | POA: Diagnosis not present

## 2024-05-23 DIAGNOSIS — N390 Urinary tract infection, site not specified: Secondary | ICD-10-CM | POA: Diagnosis not present

## 2024-05-23 DIAGNOSIS — I6782 Cerebral ischemia: Secondary | ICD-10-CM | POA: Insufficient documentation

## 2024-05-23 DIAGNOSIS — G9389 Other specified disorders of brain: Secondary | ICD-10-CM | POA: Diagnosis not present

## 2024-05-23 DIAGNOSIS — Z7982 Long term (current) use of aspirin: Secondary | ICD-10-CM | POA: Diagnosis not present

## 2024-05-23 LAB — COMPREHENSIVE METABOLIC PANEL WITH GFR
ALT: 29 U/L (ref 0–44)
AST: 24 U/L (ref 15–41)
Albumin: 3.4 g/dL — ABNORMAL LOW (ref 3.5–5.0)
Alkaline Phosphatase: 86 U/L (ref 38–126)
Anion gap: 11 (ref 5–15)
BUN: 31 mg/dL — ABNORMAL HIGH (ref 8–23)
CO2: 23 mmol/L (ref 22–32)
Calcium: 9 mg/dL (ref 8.9–10.3)
Chloride: 104 mmol/L (ref 98–111)
Creatinine, Ser: 1.18 mg/dL (ref 0.61–1.24)
GFR, Estimated: >60 mL/min (ref >60)
Glucose, Bld: 180 mg/dL — ABNORMAL HIGH (ref 70–99)
Potassium: 3.9 mmol/L (ref 3.5–5.1)
Sodium: 138 mmol/L (ref 135–145)
Total Bilirubin: 0.4 mg/dL (ref 0.0–1.2)
Total Protein: 7.1 g/dL (ref 6.5–8.1)

## 2024-05-23 LAB — CBC WITH DIFFERENTIAL/PLATELET
Abs Immature Granulocytes: 0.03 K/uL (ref 0.00–0.07)
Basophils Absolute: 0 K/uL (ref 0.0–0.1)
Basophils Relative: 0 %
Eosinophils Absolute: 0.2 K/uL (ref 0.0–0.5)
Eosinophils Relative: 2 %
HCT: 32.9 % — ABNORMAL LOW (ref 39.0–52.0)
Hemoglobin: 10.1 g/dL — ABNORMAL LOW (ref 13.0–17.0)
Immature Granulocytes: 0 %
Lymphocytes Relative: 9 %
Lymphs Abs: 0.7 K/uL (ref 0.7–4.0)
MCH: 28.6 pg (ref 26.0–34.0)
MCHC: 30.7 g/dL (ref 30.0–36.0)
MCV: 93.2 fL (ref 80.0–100.0)
Monocytes Absolute: 0.5 K/uL (ref 0.1–1.0)
Monocytes Relative: 6 %
Neutro Abs: 6.5 K/uL (ref 1.7–7.7)
Neutrophils Relative %: 83 %
Platelets: 188 K/uL (ref 150–400)
RBC: 3.53 MIL/uL — ABNORMAL LOW (ref 4.22–5.81)
RDW: 15 % (ref 11.5–15.5)
WBC: 7.8 K/uL (ref 4.0–10.5)
nRBC: 0 % (ref 0.0–0.2)

## 2024-05-23 LAB — URINALYSIS, W/ REFLEX TO CULTURE (INFECTION SUSPECTED)
Bacteria, UA: NONE SEEN
Bilirubin Urine: NEGATIVE
Glucose, UA: NEGATIVE mg/dL
Hgb urine dipstick: NEGATIVE
Ketones, ur: NEGATIVE mg/dL
Nitrite: NEGATIVE
Protein, ur: NEGATIVE mg/dL
Specific Gravity, Urine: 1.019 (ref 1.005–1.030)
WBC, UA: >50 WBC/hpf (ref 0–5)
pH: 5 (ref 5.0–8.0)

## 2024-05-23 LAB — RESP PANEL BY RT-PCR (RSV, FLU A&B, COVID)  RVPGX2
Influenza A by PCR: NEGATIVE
Influenza B by PCR: NEGATIVE
Resp Syncytial Virus by PCR: NEGATIVE
SARS Coronavirus 2 by RT PCR: NEGATIVE

## 2024-05-23 LAB — TROPONIN I (HIGH SENSITIVITY): Troponin I (High Sensitivity): 3 ng/L (ref <18)

## 2024-05-23 MED ORDER — CEPHALEXIN 500 MG PO CAPS
500.0000 mg | ORAL_CAPSULE | Freq: Two times a day (BID) | ORAL | 0 refills | Status: AC
Start: 2024-05-23 — End: 2024-05-30

## 2024-05-23 MED ORDER — SODIUM CHLORIDE 0.9 % IV SOLN
1.0000 g | Freq: Once | INTRAVENOUS | Status: AC
  Administered 2024-05-23: 1 g via INTRAVENOUS
  Filled 2024-05-23: qty 10

## 2024-05-23 NOTE — ED Provider Notes (Signed)
 [place] EMERGENCY DEPARTMENT AT [facility] Provider Note   CSN: [id number] Arrival date & time: 05/23/24  1353     Patient presents with: Weakness   Walter [name] is a 78 y.o. male.   HPI 78 year old male with a history of Parkinson's presents with generalized weakness since yesterday.  Yesterday he had his levodopa -carbidopa  doubled.  Started taking the increased dose yesterday and has been feeling weak a little bit yesterday but now significantly more so today.  He is always weak to some degree but normally can stand on his own and help participate in changing and feeding himself.  Today he was too weak to stand on his own and could not feed himself.  This is atypical.  He does not have any unilateral weakness.  No headache.  He does have a cough that is similar to when he had pneumonia a few weeks ago.  No urinary symptoms but he always urinates often so it is hard for the wife and patient to tell.  No abdominal pain, vomiting, chest pain, etc.  Prior to Admission medications   Medication Sig Start Date End Date Taking? Authorizing Provider  cephALEXin  (KEFLEX ) 500 MG capsule Take 1 capsule (500 mg total) by mouth 2 (two) times daily for 7 days. 05/23/24 05/30/24 Yes Freddi Hamilton, MD  acetaminophen  (TYLENOL ) 325 MG tablet Take 1-2 tablets (325-650 mg total) by mouth every 4 (four) hours as needed for mild pain (pain score 1-3). 10/04/23   Setzer, Nena PARAS, PA-C  aspirin  EC 81 MG tablet Take 1 tablet (81 mg total) by mouth daily. Swallow whole. 08/26/23   Mallipeddi, Vishnu P, MD  atorvastatin  (LIPITOR) 40 MG tablet Take 1 tablet by mouth once daily 04/13/24   Burchette, Wolm ORN, MD  Blood Glucose Monitoring Suppl (ONE TOUCH ULTRA 2) w/Device KIT 1 kit by Does not apply route daily. 07/21/20   Gladis Elsie BROCKS, PA-C  calcium  carbonate (TUMS - DOSED IN MG ELEMENTAL CALCIUM ) 500 MG chewable tablet Chew 2 tablets (400 mg of elemental calcium  total) by mouth 2 (two) times  daily with a meal. Patient not taking: Reported on 05/22/2024 10/04/23   Setzer, Sandra J, PA-C  carbidopa -levodopa  (SINEMET ) 25-100 MG tablet Take 2 tablet three times a day before meals 05/22/24   Georjean Darice HERO, MD  cyanocobalamin  2000 MCG tablet Take 1 tablet (2,000 mcg total) by mouth daily. 04/24/19   Gladis Elsie BROCKS, PA-C  docusate sodium  (COLACE) 100 MG capsule Take 1 capsule (100 mg total) by mouth daily. 10/04/23   Setzer, Sandra J, PA-C  Ferrous Sulfate  (IRON ) 325 (65 Fe) MG TABS Take 1 tablet (325 mg total) by mouth daily. 10/19/23   Burchette, Wolm ORN, MD  Lancets [facility] ULTRASOFT) lancets Check blood sugars once daily. Dx: E11.51 02/20/24   Burchette, Wolm ORN, MD  loratadine (ALLERGY RELIEF) 10 MG tablet Take 10 mg by mouth daily. Patient not taking: Reported on 05/22/2024    [provider]  losartan  (COZAAR ) 25 MG tablet Take 1 tablet (25 mg total) by mouth at bedtime. 10/19/23   Burchette, Wolm ORN, MD  memantine  (NAMENDA ) 10 MG tablet Take 1 tablet (10 mg total) by mouth 2 (two) times daily. 03/09/24   Georjean Darice HERO, MD  metFORMIN  (GLUCOPHAGE ) 500 MG tablet Take 1 tablet (500 mg total) by mouth 2 (two) times daily with a meal. 01/05/24   Burchette, Wolm ORN, MD  Multiple Vitamin (MULTIVITAMIN WITH MINERALS) TABS tablet Take 1 tablet by mouth  daily. 03/08/23   Arlice Reichert, MD  ondansetron  (ZOFRAN -ODT) 8 MG disintegrating tablet Take 1 tablet (8 mg total) by mouth every 8 (eight) hours as needed for nausea or vomiting. 01/09/24   Burchette, Wolm ORN, MD  ONETOUCH ULTRA test strip USE 1 STRIP TO CHECK GLUCOSE TWICE DAILY 04/13/24   Burchette, Wolm ORN, MD  pantoprazole  (PROTONIX ) 40 MG tablet Take 1 tablet by mouth twice daily 02/27/24   Burchette, Wolm ORN, MD    Allergies: Patient has no known allergies.    Review of Systems  Constitutional:  Negative for fever.  Respiratory:  Positive for cough. Negative for shortness of breath.   Cardiovascular:  Negative for chest pain.   Gastrointestinal:  Negative for abdominal pain, diarrhea and vomiting.  Genitourinary:  Negative for dysuria.  Neurological:  Positive for weakness. Negative for headaches.    Updated Vital Signs BP (!) 171/75   Pulse 71   Temp 98.7 F (37.1 C) (Oral)   Resp 11   Ht 5' 7 (1.702 m)   Wt 61 kg   SpO2 97%   BMI 21.06 kg/m   Physical Exam Vitals and nursing note reviewed.  Constitutional:      General: He is not in acute distress.    Appearance: He is well-developed. He is not ill-appearing or diaphoretic.  HENT:     Head: Normocephalic and atraumatic.  Cardiovascular:     Rate and Rhythm: Normal rate and regular rhythm.     Heart sounds: Normal heart sounds.  Pulmonary:     Effort: Pulmonary effort is normal.     Breath sounds: Normal breath sounds.  Abdominal:     General: There is no distension.     Palpations: Abdomen is soft.     Tenderness: There is no abdominal tenderness.  Skin:    General: Skin is warm and dry.  Neurological:     Mental Status: He is alert.     Comments: 5/5 strength in BUE, can't lift either leg off the stretcher, which is weaker than normal.     (all labs ordered are listed, but only abnormal results are displayed) Labs Reviewed  COMPREHENSIVE METABOLIC PANEL WITH GFR - Abnormal; Notable for the following components:      Result Value   Glucose, Bld 180 (*)    BUN 31 (*)    Albumin  3.4 (*)    All other components within normal limits  CBC WITH DIFFERENTIAL/PLATELET - Abnormal; Notable for the following components:   RBC 3.53 (*)    Hemoglobin 10.1 (*)    HCT 32.9 (*)    All other components within normal limits  URINALYSIS, W/ REFLEX TO CULTURE (INFECTION SUSPECTED) - Abnormal; Notable for the following components:   APPearance HAZY (*)    Leukocytes,Ua LARGE (*)    All other components within normal limits  RESP PANEL BY RT-PCR (RSV, FLU A&B, COVID)  [id number]  URINE CULTURE  TROPONIN I (HIGH SENSITIVITY)    EKG: EKG  Interpretation Date/Time:  Wednesday May 23 2024 14:12:49 EDT Ventricular Rate:  89 PR Interval:  145 QRS Duration:  91 QT Interval:  370 QTC Calculation: 451 R Axis:   84  Text Interpretation: Sinus rhythm Borderline right axis deviation Borderline ST depression, lateral leads Confirmed by Freddi Hamilton [phone]) on 05/23/2024 3:02:03 PM  Radiology: DG Chest Portable 1 View Result Date: 05/23/2024 CLINICAL DATA:  Weakness cough EXAM: PORTABLE CHEST 1 VIEW COMPARISON:  04/20/2024 FINDINGS: The heart size and mediastinal contours are  within normal limits. Both lungs are clear. The visualized skeletal structures are unremarkable. Aortic atherosclerosis. IMPRESSION: No active disease. Electronically Signed   By: Luke Bun M.D.   On: 05/23/2024 15:49   CT Head Wo Contrast Result Date: 05/23/2024 CLINICAL DATA:  Weakness for 3 days EXAM: CT HEAD WITHOUT CONTRAST TECHNIQUE: Contiguous axial images were obtained from the base of the skull through the vertex without intravenous contrast. RADIATION DOSE REDUCTION: This exam was performed according to the departmental dose-optimization program which includes automated exposure control, adjustment of the mA and/or kV according to patient size and/or use of iterative reconstruction technique. COMPARISON:  MRI 07/11/2019, CT 01/06/2019 FINDINGS: Brain: No acute territorial infarction, hemorrhage or intracranial mass. Moderate advanced atrophy progressed compared to prior head CT. Moderate chronic small vessel ischemic changes of the white matter. Ventricular enlargement, also increased compared to prior and suspected to be due to atrophy progression. Vascular: No hyperdense vessels.  Carotid vascular calcification Skull: Normal. Negative for fracture or focal lesion. Sinuses/Orbits: No acute finding. Other: None IMPRESSION: 1. No CT evidence for acute intracranial abnormality. 2. Moderate to advanced atrophy, progressed compared to prior head CT. Moderate  chronic small vessel ischemic changes of the white matter. Electronically Signed   By: Luke Bun M.D.   On: 05/23/2024 15:32     Procedures   Medications Ordered in the ED  cefTRIAXone  (ROCEPHIN ) 1 g in sodium chloride  0.9 % 100 mL IVPB (0 g Intravenous Stopped 05/23/24 1805)                                    Medical Decision Making Amount and/or Complexity of Data Reviewed Labs: ordered.    Details: Normal WBC, stable anemia. Radiology: ordered and independent interpretation performed.    Details: No head bleed or pneumonia ECG/medicine tests: ordered and independent interpretation performed.    Details: No acute ischemia  Risk Prescription drug management.   Patient feels generally weak.  Workup does show a UTI.  He was also given a bolus of fluids which I think as acutely helped him as he is now freely moving his lower extremities on the stretcher which she could not do before.  He does not have any signs or symptoms of sepsis at this point.  Likely commendation of UTI and perhaps some dehydration.  He is feeling well enough for discharge and prefers discharge.  I think this is reasonable, he has received a dose of Rocephin  and will discharge on Keflex .  Will have him follow-up closely with his outpatient team.  Will give return precautions.  Low suspicion for ACS, stroke etc.     Final diagnoses:  Acute urinary tract infection    ED Discharge Orders          Ordered    cephALEXin  (KEFLEX ) 500 MG capsule  2 times daily        05/23/24 1821               Freddi Hamilton, MD 05/23/24 [place]

## 2024-05-23 NOTE — Telephone Encounter (Signed)
 That is unusual, let's go back down to 1 tab TID.

## 2024-05-23 NOTE — ED Triage Notes (Addendum)
 Pt bib [facility] from home for weakness x 3 days per wife. Pt states he has felt weak since yesterday after his parkinson medication was doubled.

## 2024-05-23 NOTE — Discharge Instructions (Signed)
 Start the antibiotics tomorrow.  We are placing you on 7 days of antibiotics.  Follow-up closely with your primary care physician in the next 24-48 hours.  If you develop fever, new or worsening weakness, vomiting, or any other new/concerning symptoms then return to the ER.

## 2024-05-23 NOTE — Telephone Encounter (Signed)
 FYI Only or Action Required?: FYI only for provider.  Patient was last seen in primary care on 04/20/2024 by Micheal Wolm ORN, MD.  Called Nurse Triage reporting Fatigue.  Symptoms began today.  Interventions attempted: Rest, hydration, or home remedies.  Symptoms are: rapidly worsening.  Triage Disposition: Call EMS 911 Now  Patient/caregiver understands and will follow disposition?: YesCopied from CRM [id number]. Topic: Clinical - Red Word Triage >> May 23, 2024 12:59 PM Harlene ORN wrote: Red Word that prompted transfer to Nurse Triage: Husband has parkinsons and takes half of his medications. This morning, his BP and pulse is fine. Cannot pick up his feet and he's anemic. Reason for Disposition  [1] SEVERE weakness (e.g., unable to walk or barely able to walk, requires support) AND [2] new-onset or getting worse  Answer Assessment - Initial Assessment Questions 1. DESCRIPTION: Describe how you are feeling.     Leaning over- can't lift water 2. SEVERITY: How bad is it?  Can you stand and walk?     No  3. ONSET: When did these symptoms begin? (e.g., hours, days, weeks, months)     This morning  4. CAUSE: What do you think is causing the weakness or fatigue? (e.g., not drinking enough fluids, medical problem, trouble sleeping)     Not sure 5. NEW MEDICINES:  Have you started on any new medicines recently? (e.g., opioid pain medicines, benzodiazepines, muscle relaxants, antidepressants, antihistamines, neuroleptics, beta blockers)     denies 6. OTHER SYMPTOMS: Do you have any other symptoms? (e.g., chest pain, fever, cough, SOB, vomiting, diarrhea, bleeding, other areas of pain)     cough  Wife Blima called with concerns of extreme weakness that started this morning. Son had to help get pt dressed. He can't lift a water bottle. Can't sit up. Can't walk. He's just leaning over in bed. Wife stated he is breathing and responsive to wife. Pt has started coughing recently  and wonders if pneumonia has returned. Pt had come pinto beans, potatoes, and cornbread for lunch. Pt went to neurology yesterday and Carb/levo 25/100 was increased from once daily to twice daily. RN called 911 and stayed on line until EMS arrived.  Protocols used: Weakness (Generalized) and Fatigue-A-AH

## 2024-05-23 NOTE — Telephone Encounter (Signed)
 Pt. Not been able to move, wife can not stand him up. It happen after the Rx dosage was increased, can you call back to discuss

## 2024-05-24 NOTE — Telephone Encounter (Signed)
 Noted.  Kristian Covey MD [facility]

## 2024-05-25 LAB — URINE CULTURE: Culture: >=100000 — AB

## 2024-05-26 ENCOUNTER — Telehealth (HOSPITAL_BASED_OUTPATIENT_CLINIC_OR_DEPARTMENT_OTHER): Payer: Self-pay | Admitting: *Deleted

## 2024-05-26 NOTE — Telephone Encounter (Signed)
 Post ED Visit - Positive Culture Follow-up: Successful Patient Follow-Up  Culture assessed and recommendations reviewed by:  []  Rankin Dee, Pharm.D. []  Venetia Gully, Pharm.D., BCPS AQ-ID []  Garrel Crews, Pharm.D., BCPS []  Almarie Lunger, Pharm.D., BCPS []  [place], [place].D., BCPS, AAHIVP []  Rosaline Bihari, Pharm.D., BCPS, AAHIVP []  Vernell Meier, PharmD, BCPS []  Latanya Hint, PharmD, BCPS []  Lamoyne Medley, PharmD, BCPS [x]  Dorn Poot, PharmD  Positive urine culture  []  Patient discharged without antimicrobial prescription and treatment is now indicated [x]  Organism is resistant to prescribed ED discharge antimicrobial []  Patient with positive blood cultures  Changes discussed with ED provider: Norleen Essex, PA New antibiotic prescription Amoxil 500mg  TID x 7 days Stop Keflex  Called to [place], [place]  Contacted patient, date 05/26/24, time 1102   Albino Alan Novak 05/26/2024, 11:03 AM

## 2024-05-26 NOTE — Progress Notes (Signed)
 ED Antimicrobial Stewardship Positive Culture Follow Up   Nader Boys is an 78 y.o. male who presented to [facility] on 05/23/2024 with a chief complaint of  Chief Complaint  Patient presents with   Weakness    Recent Results (from the past 720 hours)  Resp panel by RT-PCR (RSV, Flu A&B, Covid) Urine, Clean Catch     Status: None   Collection Time: 05/23/24  4:01 PM   Specimen: Urine, Clean Catch; Nasal Swab  Result Value Ref Range Status   SARS Coronavirus 2 by RT PCR NEGATIVE NEGATIVE Final    Comment: (NOTE) SARS-CoV-2 target nucleic acids are NOT DETECTED.  The SARS-CoV-2 RNA is generally detectable in upper respiratory specimens during the acute phase of infection. The lowest concentration of SARS-CoV-2 viral copies this assay can detect is 138 copies/mL. A negative result does not preclude SARS-Cov-2 infection and should not be used as the sole basis for treatment or other patient management decisions. A negative result may occur with  improper specimen collection/handling, submission of specimen other than nasopharyngeal swab, presence of viral mutation(s) within the areas targeted by this assay, and inadequate number of viral copies(<138 copies/mL). A negative result must be combined with clinical observations, patient history, and epidemiological information. The expected result is Negative.  Fact Sheet for Patients:  BloggerCourse.com  Fact Sheet for Healthcare Providers:  SeriousBroker.it  This test is no t yet approved or cleared by the [place]  FDA and  has been authorized for detection and/or diagnosis of SARS-CoV-2 by FDA under an Emergency Use Authorization (EUA). This EUA will remain  in effect (meaning this test can be used) for the duration of the COVID-19 declaration under Section 564(b)(1) of the Act, 21 U.S.C.section 360bbb-3(b)(1), unless the authorization is terminated  or revoked sooner.        Influenza A by PCR NEGATIVE NEGATIVE Final   Influenza B by PCR NEGATIVE NEGATIVE Final    Comment: (NOTE) The Xpert Xpress SARS-CoV-2/FLU/RSV plus assay is intended as an aid in the diagnosis of influenza from Nasopharyngeal swab specimens and should not be used as a sole basis for treatment. Nasal washings and aspirates are unacceptable for Xpert Xpress SARS-CoV-2/FLU/RSV testing.  Fact Sheet for Patients: BloggerCourse.com  Fact Sheet for Healthcare Providers: SeriousBroker.it  This test is not yet approved or cleared by the [place]  FDA and has been authorized for detection and/or diagnosis of SARS-CoV-2 by FDA under an Emergency Use Authorization (EUA). This EUA will remain in effect (meaning this test can be used) for the duration of the COVID-19 declaration under Section 564(b)(1) of the Act, 21 U.S.C. section 360bbb-3(b)(1), unless the authorization is terminated or revoked.     Resp Syncytial Virus by PCR NEGATIVE NEGATIVE Final    Comment: (NOTE) Fact Sheet for Patients: BloggerCourse.com  Fact Sheet for Healthcare Providers: SeriousBroker.it  This test is not yet approved or cleared by the [place]  FDA and has been authorized for detection and/or diagnosis of SARS-CoV-2 by FDA under an Emergency Use Authorization (EUA). This EUA will remain in effect (meaning this test can be used) for the duration of the COVID-19 declaration under Section 564(b)(1) of the Act, 21 U.S.C. section 360bbb-3(b)(1), unless the authorization is terminated or revoked.  Performed at [facility], [address] [place], [place] [address]   Urine Culture     Status: Abnormal   Collection Time: 05/23/24  4:01 PM   Specimen: Urine, Random  Result Value Ref Range Status   Specimen  Description   Final    URINE, RANDOM Performed at [facility], [address]  [place], [place] [address]    Special Requests   Final    NONE Reflexed from [id number] Performed at [facility], [address] [place], [place] [address]    Culture >=100,000 COLONIES/mL ENTEROCOCCUS FAECALIS (A)  Final   Report Status 05/25/2024 FINAL  Final   Organism ID, Bacteria ENTEROCOCCUS FAECALIS (A)  Final      Susceptibility   Enterococcus faecalis - MIC*    AMPICILLIN <=2 SENSITIVE Sensitive     NITROFURANTOIN <=16 SENSITIVE Sensitive     VANCOMYCIN 1 SENSITIVE Sensitive     * >=100,000 COLONIES/mL ENTEROCOCCUS FAECALIS    [x]  Treated with Keflex , organism resistant to prescribed antimicrobial []  Patient discharged originally without antimicrobial agent and treatment is now indicated  New antibiotic prescription: Amoxil  ED Provider: Norleen Essex, PA-C   Dorn Poot 05/26/2024, 10:32 AM Clinical Pharmacist Monday - Friday phone -  [phone] Saturday - Sunday phone - [phone]

## 2024-05-28 ENCOUNTER — Telehealth: Payer: Self-pay

## 2024-05-28 NOTE — Telephone Encounter (Signed)
 Copied from CRM [id number]. Topic: Clinical - Medical Advice >> May 28, 2024  3:32 PM [place] J wrote: Reason for CRM: Patient's wife took the patient to [facility] on 05/23/24 due to him not being able to eat and feeling weak. The hospital found white blood cells in his urine. He was given cephalexin  500mg  for 2x day. The next day, the hospital switched him to a different medication and she is having some concerns about his current condition and why they would switch out his medication.

## 2024-05-29 DIAGNOSIS — C44311 Basal cell carcinoma of skin of nose: Secondary | ICD-10-CM | POA: Diagnosis not present

## 2024-05-29 NOTE — Telephone Encounter (Signed)
 Noted

## 2024-05-30 ENCOUNTER — Ambulatory Visit: Payer: Self-pay

## 2024-05-30 NOTE — Telephone Encounter (Signed)
 FYI Only or Action Required?: FYI only for provider.  Patient was last seen in primary care on 04/20/2024 by Micheal Wolm ORN, MD.  Called Nurse Triage reporting Hematuria.  Symptoms began several days ago.  Interventions attempted: Prescription medications: amoxicillin.  Symptoms are: gradually worsening.  Triage Disposition: See Physician Within 24 Hours  Patient/caregiver understands and will follow disposition?: Yes   States wanted Dr. Micheal to know what is going on.  Wife states taking husband to kidney doctor today.   Copied from CRM [id number]. Topic: Clinical - Red Word Triage >> May 30, 2024 10:34 AM Viola FALCON wrote: Red Word that prompted transfer to Nurse Triage: Patient spouse Blima says patient has blood in urine, size of the dime. Patient currently has UTI and is on antibiotics. Reason for Disposition  Pain or burning with passing urine  Answer Assessment - Initial Assessment Questions 1. COLOR of URINE: Describe the color of the urine.  (e.g., tea-colored, pink, red, bloody) Do you have blood clots in your urine? (e.g., none, pea, grape, small coin)     Red, denies clots 2. ONSET: When did the bleeding start?      This am 3. EPISODES: How many times has there been blood in the urine? or How many times today?     2 twice 4. PAIN with URINATION: Is there any pain with passing your urine? If Yes, ask: How bad is the pain?  (Scale 1-10; or mild, moderate, severe)     denies 5. FEVER: Do you have a fever? If Yes, ask: What is your temperature, how was it measured, and when did it start?     denies 6. ASSOCIATED SYMPTOMS: Are you passing urine more frequently than usual?     unknown 7. OTHER SYMPTOMS: Do you have any other symptoms? (e.g., back/flank pain, abdomen pain, vomiting)     no 8. PREGNANCY: Is there any chance you are pregnant? When was your last menstrual period?     na  Protocols used: Urine - Blood In-A-AH

## 2024-06-10 ENCOUNTER — Other Ambulatory Visit: Payer: Self-pay | Admitting: Family Medicine

## 2024-06-10 DIAGNOSIS — E1151 Type 2 diabetes mellitus with diabetic peripheral angiopathy without gangrene: Secondary | ICD-10-CM

## 2024-06-11 ENCOUNTER — Other Ambulatory Visit: Payer: Self-pay | Admitting: *Deleted

## 2024-06-11 ENCOUNTER — Telehealth: Payer: Self-pay | Admitting: Neurology

## 2024-06-11 NOTE — Telephone Encounter (Signed)
 Spoke to pt wife--need verification if need to increase more med. Per last note :Increase Carbidopa /Levodopa  25/100mg : take 2 tablets three times a day before meals. Please advise

## 2024-06-11 NOTE — Telephone Encounter (Signed)
 Spoke to pt wife regarding medication carbidopa /levodopa  w/ instructions.

## 2024-06-11 NOTE — Telephone Encounter (Signed)
 Left a message on the VM stating that she needs to speak with someone about his medication. She dis not leave the name of the medication on the VM

## 2024-06-11 NOTE — Telephone Encounter (Signed)
 Pls ask if there was a problem when he increased to 2 tablets three times a day? If none, would stay on 2 tabs TID. If he had problems, they can go back to 1 tab TID. Thanks

## 2024-06-14 ENCOUNTER — Ambulatory Visit (INDEPENDENT_AMBULATORY_CARE_PROVIDER_SITE_OTHER): Admitting: Family Medicine

## 2024-06-14 DIAGNOSIS — Z0000 Encounter for general adult medical examination without abnormal findings: Secondary | ICD-10-CM

## 2024-06-14 NOTE — Progress Notes (Signed)
 PATIENT CHECK-IN and HEALTH RISK ASSESSMENT QUESTIONNAIRE:  -completed by phone/video for upcoming Medicare Preventive Visit  Pre-Visit Check-in: 1)Vitals (height, wt, BP, etc) - record in vitals section for visit on day of visit Request home vitals (wt, BP, etc.) and enter into vitals, THEN update Vital Signs SmartPhrase below at the top of the HPI. See below.  2)Review and Update Medications, Allergies PMH, Surgeries, Social history in Epic 3)Hospitalizations in the last year with date/reason? n  4)Review and Update Care Team (patient's specialists) in Epic 5) Complete PHQ9 in Epic  6) Complete Fall Screening in Epic 7)Review all Health Maintenance Due and order if not done.  Medicare Wellness Patient Questionnaire:  Answer theses question about your habits: How often do you have a drink containing alcohol?n How many drinks containing alcohol do you have on a typical day when you are drinking?na How often do you have six or more drinks on one occasion?na Have you ever smoked?n Quit date if applicable? na  How many packs a day do/did you smoke? na Do you use smokeless tobacco?n Do you use an illicit drugs?n On average, how many days per week do you engage in moderate to strenuous exercise (like a brisk walk)? 2 times per weak Typical diet: wife reports eating well, drinks protein shake and eats well, eggs in the morning, meat and veggies for other meals  Beverages: water  Answer theses question about your everyday activities: Can you perform most household chores?n Are you deaf or have significant trouble hearing? Yes, they plan checking Do you feel that you have a problem with memory?n Do you feel safe at home?y Last dentist visit?goes twice a year 8. Do you have any difficulty performing your everyday activities?some - wife helps  Are you having any difficulty walking, taking medications on your own, and or difficulty managing daily home needs?wife helps Do you have difficulty  walking or climbing stairs? N - has rails Do you have difficulty dressing or bathing?some  Do you have difficulty doing errands alone such as visiting a doctor's office or shopping?y, wife helps Do you currently have any difficulty preparing food and eating?y, wife does Do you currently have any difficulty using the toilet? n Do you have any difficulty managing your finances?n Do you have any difficulties with housekeeping of managing your housekeeping?n   Do you have Advanced Directives in place (Living Will, Healthcare Power or Attorney)? y   Last eye Exam and location?Dr. Nicholaus, goes once a year   Do you currently use prescribed or non-prescribed narcotic or opioid pain medications?n  Do you have a history or close family history of breast, ovarian, tubal or peritoneal cancer or a family member with BRCA (breast cancer susceptibility 1 and 2) gene mutations?    ----------------------------------------------------------------------------------------------------------------------------------------------------------------------------------------------------------------------  Because this visit was a virtual/telehealth visit, some criteria may be missing or patient reported. Any vitals not documented were not able to be obtained and vitals that have been documented are patient reported.    MEDICARE ANNUAL PREVENTIVE CARE VISIT WITH PROVIDER (Welcome to Medicare, initial annual wellness or annual wellness exam)  Virtual Visit via Phone Note  I connected with Walter [name] on 06/14/24  by phone and verified that I am speaking with the correct person using two identifiers. Prefers phone visit.   Location patient: home Location provider:work or home office Persons participating in the virtual visit: patient, provider  Concerns and/or follow up today: reports is stable.   See HM section in Epic for other  details of completed HM.    ROS: negative for report of fevers,  unintentional weight loss, vision changes, vision loss, hearing loss or change, chest pain, sob, hemoptysis, melena, hematochezia, hematuria, falls, bleeding or bruising, thoughts of suicide or self harm, memory loss  Patient-completed extensive health risk assessment - reviewed and discussed with the patient: See Health Risk Assessment completed with patient prior to the visit either above or in recent phone note. This was reviewed in detailed with the patient today and appropriate recommendations, orders and referrals were placed as needed per Summary below and patient instructions.   Review of Medical History: -PMH, PSH, Family History and current specialty and care providers reviewed and updated and listed below   Patient Care Team: Micheal Wolm ORN, MD as PCP - General (Family Medicine) Stacia Diannah SQUIBB, MD as PCP - Cardiology (Cardiology) Georjean Darice HERO, MD as Consulting Physician (Neurology) Dr [facility], OD (Optometry) Pandora Cadet, [facility] as Pharmacist (Pharmacist)   Past Medical History:  Diagnosis Date   Allergy    Cancer [facility])    Skin cancer   Cataract    Coronary artery disease    Diabetes mellitus    Diverticulosis    Esophageal stricture    GERD (gastroesophageal reflux disease)    History of hiatal hernia    Hyperlipidemia    Hypertension    Peripheral arterial disease (HCC)    Peripheral vascular disease (HCC)     Past Surgical History:  Procedure Laterality Date   ABDOMINAL AORTIC ENDOVASCULAR STENT GRAFT N/A 09/16/2023   Procedure: ABDOMINAL AORTA ANEURYSM STENT USING COOK 32 X STENT AND ENDO-ANCHORS X 7;  Surgeon: Sheree Penne Bruckner, MD;  Location: [facility] OR;  Service: Vascular;  Laterality: N/A;   ANEURYSM COILING Right 09/16/2023   Procedure: COILING TO RIGHT COMMON ILIAC;  Surgeon: Sheree Penne Bruckner, MD;  Location: [facility] OR;  Service: Vascular;  Laterality: Right;   BIOPSY  03/07/2023   Procedure: BIOPSY;   Surgeon: Albertus Gordy HERO, MD;  Location: [facility] ENDOSCOPY;  Service: Gastroenterology;;   BYPASS GRAFT FEMORAL-PERONEAL Left 09/16/2023   Procedure: LEFT EXTERNAL ILIAC TO COMMON FEMORAL BYPASS USING HEMASHIELD GOLD GRAFT;  Surgeon: Sheree Penne Bruckner, MD;  Location: [facility] OR;  Service: Vascular;  Laterality: Left;   CATARACT EXTRACTION Bilateral    COLONOSCOPY WITH PROPOFOL  N/A 03/07/2023   Procedure: COLONOSCOPY WITH PROPOFOL ;  Surgeon: Albertus Gordy HERO, MD;  Location: [facility] ENDOSCOPY;  Service: Gastroenterology;  Laterality: N/A;   CORONARY ANGIOPLASTY WITH STENT PLACEMENT  2004   ENDARTERECTOMY Left 09/16/2023   Procedure: LEFT ILIAC ENDARTERECTOMY;  Surgeon: Sheree Penne Bruckner, MD;  Location: [facility] OR;  Service: Vascular;  Laterality: Left;   ENDARTERECTOMY FEMORAL Bilateral 09/16/2023   Procedure: BILATERAL FEMORAL ENDARTERECTOMY;  Surgeon: Sheree Penne Bruckner, MD;  Location: [facility] OR;  Service: Vascular;  Laterality: Bilateral;   ENTEROSCOPY N/A 09/28/2023   Procedure: ENTEROSCOPY;  Surgeon: Federico Rosario BROCKS, MD;  Location: [facility] ENDOSCOPY;  Service: Gastroenterology;  Laterality: N/A;   ESOPHAGOGASTRODUODENOSCOPY  06/07/2012   Procedure: ESOPHAGOGASTRODUODENOSCOPY (EGD);  Surgeon: Princella HERO Nida, MD;  Location: [facility];  Service: Endoscopy;  Laterality: N/A;   ESOPHAGOGASTRODUODENOSCOPY (EGD) WITH PROPOFOL  N/A 03/07/2023   Procedure: ESOPHAGOGASTRODUODENOSCOPY (EGD) WITH PROPOFOL ;  Surgeon: Albertus Gordy HERO, MD;  Location: [facility] ENDOSCOPY;  Service: Gastroenterology;  Laterality: N/A;   FEMORAL-FEMORAL BYPASS GRAFT Bilateral 09/16/2023   Procedure: LEFT TO RIGHT BYPASS GRAFT FEMORAL-FEMORAL ARTERY USING HEMSHIELD GOLD GRAFT;  Surgeon: Sheree Penne Bruckner, MD;  Location: [facility] OR;  Service: Vascular;  Laterality: Bilateral;   HOT HEMOSTASIS N/A 03/07/2023   Procedure: HOT HEMOSTASIS (ARGON PLASMA COAGULATION/BICAP);  Surgeon: Albertus Gordy HERO, MD;  Location: [facility] ENDOSCOPY;  Service:  Gastroenterology;  Laterality: N/A;   HOT HEMOSTASIS N/A 09/28/2023   Procedure: HOT HEMOSTASIS (ARGON PLASMA COAGULATION;  Surgeon: Federico Rosario BROCKS, MD;  Location: [facility] ENDOSCOPY;  Service: Gastroenterology;  Laterality: N/A;   IR KYPHO LUMBAR INC FX REDUCE BONE BX UNI/BIL CANNULATION INC/IMAGING  03/02/2023   IVC FILTER INSERTION N/A 03/09/2023   Procedure: IVC FILTER INSERTION;  Surgeon: Lanis Fonda BRAVO, MD;  Location: [facility] INVASIVE CV LAB;  Service: Cardiovascular;  Laterality: N/A;   POLYPECTOMY  03/07/2023   Procedure: POLYPECTOMY;  Surgeon: Albertus Gordy HERO, MD;  Location: [facility] ENDOSCOPY;  Service: Gastroenterology;;   SHOULDER SURGERY  11/01/2004    Social History   Socioeconomic History   Marital status: Married    Spouse name: Not on file   Number of children: 3   Years of education: Not on file   Highest education level: Not on file  Occupational History   Occupation: Auditor  Tobacco Use   Smoking status: Former    Current packs/day: 0.00    Types: Cigarettes    Quit date: 11/02/1999    Years since quitting: 24.6   Smokeless tobacco: Never  Vaping Use   Vaping status: Never Used  Substance and Sexual Activity   Alcohol use: No   Drug use: No   Sexual activity: Not Currently    Partners: Female  Other Topics Concern   Not on file  Social History Narrative   Daily caffeine       Right handed      Lives with wife in a one story home      Social Drivers of Health   Financial Resource Strain: Low Risk  (04/27/2023)   Overall Financial Resource Strain (CARDIA)    Difficulty of Paying Living Expenses: Not hard at all  Food Insecurity: No Food Insecurity (09/21/2023)   Hunger Vital Sign    Worried About Running Out of Food in the Last Year: Never true    Ran Out of Food in the Last Year: Never true  Transportation Needs: No Transportation Needs (09/21/2023)   PRAPARE - Administrator, Civil Service (Medical): No    Lack of Transportation (Non-Medical): No   Physical Activity: Insufficiently Active (04/27/2023)   Exercise Vital Sign    Days of Exercise per Week: 3 days    Minutes of Exercise per Session: 30 min  Stress: No Stress Concern Present (04/27/2023)   [employer] of Occupational Health - Occupational Stress Questionnaire    Feeling of Stress : Not at all  Social Connections: Socially Integrated (04/27/2023)   Social Connection and Isolation Panel    Frequency of Communication with Friends and Family: More than three times a week    Frequency of Social Gatherings with Friends and Family: More than three times a week    Attends Religious Services: More than 4 times per year    Active Member of [employer] or Organizations: Yes    Attends Banker Meetings: More than 4 times per year    Marital Status: Married  Catering manager Violence: Not At Risk (09/21/2023)   Humiliation, Afraid, Rape, and Kick questionnaire    Fear of Current or Ex-Partner: No    Emotionally Abused: No    Physically Abused: No    Sexually Abused:  No    Family History  Problem Relation Age of Onset   Breast cancer Mother    Healthy Son    Healthy Son    Parkinson's disease Sister    Colon cancer Neg Hx    Esophageal cancer Neg Hx    Rectal cancer Neg Hx    Stomach cancer Neg Hx     Current Outpatient Medications on File Prior to Visit  Medication Sig Dispense Refill   acetaminophen  (TYLENOL ) 325 MG tablet Take 1-2 tablets (325-650 mg total) by mouth every 4 (four) hours as needed for mild pain (pain score 1-3).     aspirin  EC 81 MG tablet Take 1 tablet (81 mg total) by mouth daily. Swallow whole.     atorvastatin  (LIPITOR) 40 MG tablet Take 1 tablet by mouth once daily 90 tablet 0   Blood Glucose Monitoring Suppl (ONE TOUCH ULTRA 2) w/Device KIT 1 kit by Does not apply route daily. 1 kit 0   carbidopa -levodopa  (SINEMET ) 25-100 MG tablet Take 2 tablet three times a day before meals 540 tablet 3   cyanocobalamin  2000 MCG tablet Take 1 tablet  (2,000 mcg total) by mouth daily. 30 tablet 0   docusate sodium  (COLACE) 100 MG capsule Take 1 capsule (100 mg total) by mouth daily.     Ferrous Sulfate  (IRON ) 325 (65 Fe) MG TABS Take 1 tablet (325 mg total) by mouth daily. 90 tablet 1   Lancets (ONETOUCH ULTRASOFT) lancets Check blood sugars once daily. Dx: E11.51 100 each 3   losartan  (COZAAR ) 25 MG tablet Take 1 tablet (25 mg total) by mouth at bedtime. 90 tablet 3   memantine  (NAMENDA ) 10 MG tablet Take 1 tablet (10 mg total) by mouth 2 (two) times daily. 180 tablet 1   metFORMIN  (GLUCOPHAGE ) 500 MG tablet Take 1 tablet (500 mg total) by mouth 2 (two) times daily with a meal. 180 tablet 1   Multiple Vitamin (MULTIVITAMIN WITH MINERALS) TABS tablet Take 1 tablet by mouth daily.     ondansetron  (ZOFRAN -ODT) 8 MG disintegrating tablet Take 1 tablet (8 mg total) by mouth every 8 (eight) hours as needed for nausea or vomiting. 20 tablet 0   ONETOUCH ULTRA test strip USE 1 STRIP TO CHECK GLUCOSE TWICE DAILY 100 each 0   pantoprazole  (PROTONIX ) 40 MG tablet Take 1 tablet by mouth twice daily 60 tablet 5   No current facility-administered medications on file prior to visit.    No Known Allergies     Physical Exam Vitals requested from patient and listed below if patient had equipment and was able to obtain at home for this virtual visit: There were no vitals filed for this visit. Estimated body mass index is 21.06 kg/m as calculated from the following:   Height as of 05/23/24: 5' 7 (1.702 m).   Weight as of 05/23/24: 134 lb 7.7 oz (61 kg).  EKG (optional): deferred due to virtual visit  GENERAL: alert, oriented, no acute distress detected; full vision exam deferred due to pandemic and/or virtual encounter  PSYCH/NEURO: pleasant and cooperative, no obvious depression or anxiety, speech and thought processing grossly intact, Cognitive function grossly intact  Flowsheet Row Office Visit from [facility]/16/2023 in [facility] at  [facility]  PHQ-9 Total Score 18        06/14/2024    3:51 PM 05/14/2024    8:53 AM 04/30/2024    9:06 AM 04/27/2023   11:07 AM 02/16/2023   10:15 AM  Depression screen  PHQ 2/9  Decreased Interest 0 0 0 0 2  Down, Depressed, Hopeless 0 0 0 0 2  PHQ - 2 Score 0 0 0 0 4  Altered sleeping     2  Tired, decreased energy     2  Change in appetite     2  Feeling bad or failure about yourself      3  Trouble concentrating     3  Moving slowly or fidgety/restless     2  Suicidal thoughts     0  PHQ-9 Score     18  Difficult doing work/chores     Not difficult at all       03/09/2024    2:28 PM 04/30/2024    9:06 AM 05/14/2024    8:53 AM 05/22/2024    9:28 AM 06/14/2024    3:48 PM  Fall Risk  Falls in the past year? 1 1 1  0 1  Was there an injury with Fall? 0 0 0 0 0  Fall Risk Category Calculator 1 2 2  0 1  Patient at Risk for Falls Due to  History of fall(s);Impaired balance/gait History of fall(s);Impaired balance/gait    Fall risk Follow up Falls evaluation completed Falls evaluation completed;Falls prevention discussed Falls evaluation completed;Falls prevention discussed Falls evaluation completed Falls evaluation completed;Education provided     SUMMARY AND PLAN:  Encounter for Medicare annual wellness exam   Discussed applicable health maintenance/preventive health measures and advised and referred or ordered per patient preferences: -discussed vaccines due recs- says already had shingles shots and recent flu shot at pharmacy and wife agrees to obtain record for Dr. Micheal -he plans to get foot exam and labs at next visit with Dr. B Health Maintenance  Topic Date Due   Diabetic kidney evaluation - Urine ACR  09/17/2010   COVID-19 Vaccine (3 - 2024-25 season) 07/03/2023   FOOT EXAM  12/09/2023   INFLUENZA VACCINE  06/01/2024   Zoster Vaccines- Shingrix (1 of 2) 06/14/2029 (Originally 05/02/1995)   HEMOGLOBIN A1C  07/04/2024   OPHTHALMOLOGY EXAM  01/04/2025   Diabetic  kidney evaluation - eGFR measurement  05/23/2025   Medicare Annual Wellness (AWV)  06/14/2025   Pneumococcal Vaccine: 50+ Years  Completed   Hepatitis C Screening  Completed   HPV VACCINES  Aged Out   Meningococcal B Vaccine  Aged Out   DTaP/Tdap/Td  Discontinued   Pneumococcal Vaccine  Discontinued   Colonoscopy  Discontinued     Education and counseling on the following was provided based on the above review of health and a plan/checklist for the patient, along with additional information discussed, was provided for the patient in the patient instructions :  -Advised on importance of completing advanced directives, discussed options for completing and provided information in patient instructions as well -discussed fall prevention -Provided counseling and plan for difficulty hearing - they are considering exam at [facility] -Advised and counseled on a healthy lifestyle - including the importance of a healthy diet, regular physical activity, -Reviewed patient's current diet. Advised and counseled on a whole foods based healthy diet. A summary of a healthy diet was provided in the Patient Instructions.  -reviewed patient's current physical activity level and discussed and congratulated on exercising at home despite barriers.  Discussed safe exercise at home to assist in meeting exercise guideline recommendations in a safe and healthy way.  -Advise yearly dental visits at minimum and regular eye exams   Follow up: see patient instructions  Patient Instructions  I really enjoyed getting to talk with you today! I am available on Tuesdays and Thursdays for virtual visits if you have any questions or concerns, or if I can be of any further assistance.   CHECKLIST FROM ANNUAL WELLNESS VISIT:  -Follow up (please call to schedule if not scheduled after visit):   -yearly for annual wellness visit with primary care office  Here is a list of your preventive care/health maintenance measures and the  plan for each if any are due:  PLAN For any measures below that may be due:    1. Can get vaccine at the pharmacy. Please bring copy of record for any vaccines done at the pharmacy. Thank you.   2. Can get foot exam and labs at next in office visit.   Health Maintenance  Topic Date Due   Diabetic kidney evaluation - Urine ACR  09/17/2010   COVID-19 Vaccine (3 - 2024-25 season) 07/03/2023   FOOT EXAM  12/09/2023   INFLUENZA VACCINE  06/01/2024   Zoster Vaccines- Shingrix (1 of 2) 06/14/2029 (Originally 05/02/1995)   HEMOGLOBIN A1C  07/04/2024   OPHTHALMOLOGY EXAM  01/04/2025   Diabetic kidney evaluation - eGFR measurement  05/23/2025   Medicare Annual Wellness (AWV)  06/14/2025   Pneumococcal Vaccine: 50+ Years  Completed   Hepatitis C Screening  Completed   HPV VACCINES  Aged Out   Meningococcal B Vaccine  Aged Out   DTaP/Tdap/Td  Discontinued   Pneumococcal Vaccine  Discontinued   Colonoscopy  Discontinued    -See a dentist at least yearly  -Get your eyes checked and then per your eye specialist's recommendations  -Other issues addressed today:   -I have included below further information regarding a healthy whole foods based diet, physical activity guidelines for adults, stress management and opportunities for social connections. I hope you find this information useful.   -----------------------------------------------------------------------------------------------------------------------------------------------------------------------------------------------------------------------------------------------------------    NUTRITION: -eat real food: lots of colorful vegetables (half the plate) and fruits -5-7 servings of vegetables and fruits per day (fresh or steamed is best), exp. 2 servings of vegetables with lunch and dinner and 2 servings of fruit per day. Berries and greens such as kale and collards are great choices.  -consume on a regular basis:  fresh fruits, fresh  veggies, fish, nuts, seeds, healthy oils (such as olive oil, avocado oil), whole grains (make sure for bread/pasta/crackers/etc., that the first ingredient on label contains the word whole), legumes. -can eat small amounts of dairy and lean meat (no larger than the palm of your hand), but avoid processed meats such as ham, bacon, lunch meat, etc. -drink water -try to avoid fast food and pre-packaged foods, processed meat, ultra processed foods/beverages (donuts, candy, etc.) -most experts advise limiting sodium to < 2300mg  per day, should limit further is any chronic conditions such as high blood pressure, heart disease, diabetes, etc. The American Heart Association advised that < 1500mg  is is ideal -try to avoid foods/beverages that contain any ingredients with names you do not recognize  -try to avoid foods/beverages  with added sugar or sweeteners/sweets  -try to avoid sweet drinks (including diet drinks): soda, juice, Gatorade, sweet tea, power drinks, diet drinks -try to avoid white rice, white bread, pasta (unless whole grain)  EXERCISE GUIDELINES FOR ADULTS: -if you wish to increase your physical activity, do so gradually and with the approval of your doctor -STOP and seek medical care immediately if you have any chest pain, chest discomfort or trouble breathing when  starting or increasing exercise  -move and stretch your body, legs, feet and arms when sitting for long periods -Physical activity guidelines for optimal health in adults: -get at least 150 minutes per week of moderate exercise (can talk, but not sing); this is about 20-30 minutes of sustained activity 5-7 days per week or two 10-15 minute episodes of sustained activity 5-7 days per week -do some muscle building/resistance training/strength training at least 2 days per week  -balance exercises 3+ days per week:   Stand somewhere where you have something sturdy to hold onto if you lose balance    1) lift up on toes, then back  down, start with 5x per day and work up to 20x   2) stand and lift one leg straight out to the side so that foot is a few inches of the floor, start with 5x each side and work up to 20x each side   3) stand on one foot, start with 5 seconds each side and work up to 20 seconds on each side  If you need ideas or help with getting more active:  -Silver sneakers https://tools.silversneakers.com  -Walk with a Doc: http://www.duncan-williams.com/  -try to include resistance (weight lifting/strength building) and balance exercises twice per week: or the following link for ideas: http://castillo-powell.com/  BuyDucts.dk  STRESS MANAGEMENT: -can try meditating, or just sitting quietly with deep breathing while intentionally relaxing all parts of your body for 5 minutes daily -if you need further help with stress, anxiety or depression please follow up with your primary doctor or contact the wonderful folks at [facility]: [phone]  SOCIAL CONNECTIONS: -options in [place] if you wish to engage in more social and exercise related activities:  -Silver sneakers https://tools.silversneakers.com  -Walk with a Doc: http://www.duncan-williams.com/  -Check out the [facility] Active Adults 50+ section on the [place] [employer] (hiking clubs, book clubs, cards and games, chess, exercise classes, aquatic classes and much more) - see the website for details: https://www.[place].gov/departments/parks-recreation/active-adults50  -YouTube has lots of exercise videos for different ages and abilities as well  -[facility] (a variety of indoor and outdoor inperson activities for adults). (980)855-5715. [address]  -Virtual Online Classes (a variety of topics): see seniorplanet.org or call [phone]  -consider volunteering at a school, hospice center, church, senior  center or elsewhere    ADVANCED HEALTHCARE DIRECTIVES:  [place] Advanced Directives assistance:   ExpressWeek.com.cy  Everyone should have advanced health care directives in place. This is so that you get the care you want, should you ever be in a situation where you are unable to make your own medical decisions.   From the [place] Advanced Directive Website: Advance Health Care Directives are legal documents in which you give written instructions about your health care if, in the future, you cannot speak for yourself.   A health care power of attorney allows you to name a person you trust to make your health care decisions if you cannot make them yourself. A declaration of a desire for a natural death (or living will) is document, which states that you desire not to have your life prolonged by extraordinary measures if you have a terminal or incurable illness or if you are in a vegetative state. An advance instruction for mental health treatment makes a declaration of instructions, information and preferences regarding your mental health treatment. It also states that you are aware that the advance instruction authorizes a mental health treatment provider to act according to your  wishes. It may also outline your consent or refusal of mental health treatment. A declaration of an anatomical gift allows anyone over the age of 61 to make a gift by will, organ donor card or other document.   Please see the following website or an elder law attorney for forms, FAQs and for completion of advanced directives: [place]  Print production planner Health Care Directives Advance Health Care Directives ([email or web address]  Or copy and paste the following to your web browser: [email or web address]          Chiquita JONELLE Cramp, DO

## 2024-06-14 NOTE — Patient Instructions (Addendum)
 I really enjoyed getting to talk with you today! I am available on Tuesdays and Thursdays for virtual visits if you have any questions or concerns, or if I can be of any further assistance.   CHECKLIST FROM ANNUAL WELLNESS VISIT:  -Follow up (please call to schedule if not scheduled after visit):   -yearly for annual wellness visit with primary care office  Here is a list of your preventive care/health maintenance measures and the plan for each if any are due:  PLAN For any measures below that may be due:    1. Can get vaccine at the pharmacy. Please bring copy of record for any vaccines done at the pharmacy. Thank you.   2. Can get foot exam and labs at next in office visit.   Health Maintenance  Topic Date Due   Diabetic kidney evaluation - Urine ACR  09/17/2010   COVID-19 Vaccine (3 - 2024-25 season) 07/03/2023   FOOT EXAM  12/09/2023   INFLUENZA VACCINE  06/01/2024   Zoster Vaccines- Shingrix (1 of 2) 06/14/2029 (Originally 05/02/1995)   HEMOGLOBIN A1C  07/04/2024   OPHTHALMOLOGY EXAM  01/04/2025   Diabetic kidney evaluation - eGFR measurement  05/23/2025   Medicare Annual Wellness (AWV)  06/14/2025   Pneumococcal Vaccine: 50+ Years  Completed   Hepatitis C Screening  Completed   HPV VACCINES  Aged Out   Meningococcal B Vaccine  Aged Out   DTaP/Tdap/Td  Discontinued   Pneumococcal Vaccine  Discontinued   Colonoscopy  Discontinued    -See a dentist at least yearly  -Get your eyes checked and then per your eye specialist's recommendations  -Other issues addressed today:   -I have included below further information regarding a healthy whole foods based diet, physical activity guidelines for adults, stress management and opportunities for social connections. I hope you find this information useful.    -----------------------------------------------------------------------------------------------------------------------------------------------------------------------------------------------------------------------------------------------------------    NUTRITION: -eat real food: lots of colorful vegetables (half the plate) and fruits -5-7 servings of vegetables and fruits per day (fresh or steamed is best), exp. 2 servings of vegetables with lunch and dinner and 2 servings of fruit per day. Berries and greens such as kale and collards are great choices.  -consume on a regular basis:  fresh fruits, fresh veggies, fish, nuts, seeds, healthy oils (such as olive oil, avocado oil), whole grains (make sure for bread/pasta/crackers/etc., that the first ingredient on label contains the word whole), legumes. -can eat small amounts of dairy and lean meat (no larger than the palm of your hand), but avoid processed meats such as ham, bacon, lunch meat, etc. -drink water -try to avoid fast food and pre-packaged foods, processed meat, ultra processed foods/beverages (donuts, candy, etc.) -most experts advise limiting sodium to < 2300mg  per day, should limit further is any chronic conditions such as high blood pressure, heart disease, diabetes, etc. The American Heart Association advised that < 1500mg  is is ideal -try to avoid foods/beverages that contain any ingredients with names you do not recognize  -try to avoid foods/beverages  with added sugar or sweeteners/sweets  -try to avoid sweet drinks (including diet drinks): soda, juice, Gatorade, sweet tea, power drinks, diet drinks -try to avoid white rice, white bread, pasta (unless whole grain)  EXERCISE GUIDELINES FOR ADULTS: -if you wish to increase your physical activity, do so gradually and with the approval of your doctor -STOP and seek medical care immediately if you have any chest pain, chest discomfort or trouble breathing when starting or  increasing  exercise  -move and stretch your body, legs, feet and arms when sitting for long periods -Physical activity guidelines for optimal health in adults: -get at least 150 minutes per week of moderate exercise (can talk, but not sing); this is about 20-30 minutes of sustained activity 5-7 days per week or two 10-15 minute episodes of sustained activity 5-7 days per week -do some muscle building/resistance training/strength training at least 2 days per week  -balance exercises 3+ days per week:   Stand somewhere where you have something sturdy to hold onto if you lose balance    1) lift up on toes, then back down, start with 5x per day and work up to 20x   2) stand and lift one leg straight out to the side so that foot is a few inches of the floor, start with 5x each side and work up to 20x each side   3) stand on one foot, start with 5 seconds each side and work up to 20 seconds on each side  If you need ideas or help with getting more active:  -Silver sneakers https://tools.silversneakers.com  -Walk with a Doc: http://www.duncan-williams.com/  -try to include resistance (weight lifting/strength building) and balance exercises twice per week: or the following link for ideas: http://castillo-powell.com/  BuyDucts.dk  STRESS MANAGEMENT: -can try meditating, or just sitting quietly with deep breathing while intentionally relaxing all parts of your body for 5 minutes daily -if you need further help with stress, anxiety or depression please follow up with your primary doctor or contact the wonderful folks at [facility]: [phone]  SOCIAL CONNECTIONS: -options in [place] if you wish to engage in more social and exercise related activities:  -Silver sneakers https://tools.silversneakers.com  -Walk with a Doc: http://www.duncan-williams.com/  -Check out the [facility] Active Adults 50+  section on the [place] of [employer] (hiking clubs, book clubs, cards and games, chess, exercise classes, aquatic classes and much more) - see the website for details: https://www.[place].gov/departments/parks-recreation/active-adults50  -YouTube has lots of exercise videos for different ages and abilities as well  -[facility] (a variety of indoor and outdoor inperson activities for adults). [phone]. [address]  -Virtual Online Classes (a variety of topics): see seniorplanet.org or call [phone]  -consider volunteering at a school, hospice center, church, senior center or elsewhere    ADVANCED HEALTHCARE DIRECTIVES:  [place] Advanced Directives assistance:   ExpressWeek.com.cy  Everyone should have advanced health care directives in place. This is so that you get the care you want, should you ever be in a situation where you are unable to make your own medical decisions.   From the [place] Advanced Directive Website: Advance Health Care Directives are legal documents in which you give written instructions about your health care if, in the future, you cannot speak for yourself.   A health care power of attorney allows you to name a person you trust to make your health care decisions if you cannot make them yourself. A declaration of a desire for a natural death (or living will) is document, which states that you desire not to have your life prolonged by extraordinary measures if you have a terminal or incurable illness or if you are in a vegetative state. An advance instruction for mental health treatment makes a declaration of instructions, information and preferences regarding your mental health treatment. It also states that you are aware that the advance instruction authorizes a mental health treatment provider to act according to your wishes. It may also  outline your consent or refusal of  mental health treatment. A declaration of an anatomical gift allows anyone over the age of 49 to make a gift by will, organ donor card or other document.   Please see the following website or an elder law attorney for forms, FAQs and for completion of advanced directives: [place]  Print production planner Health Care Directives Advance Health Care Directives ([email or web address]  Or copy and paste the following to your web browser: [email or web address]

## 2024-06-27 ENCOUNTER — Ambulatory Visit: Payer: Medicare HMO | Admitting: Neurology

## 2024-07-16 ENCOUNTER — Other Ambulatory Visit: Payer: Self-pay | Admitting: Family Medicine

## 2024-07-16 DIAGNOSIS — E785 Hyperlipidemia, unspecified: Secondary | ICD-10-CM

## 2024-07-31 NOTE — Progress Notes (Signed)
   07/31/2024  Patient ID: Walter [name], male   DOB: 1946-06-24, 78 y.o.   MRN: [id number]  Pharmacy Quality Measure Review  This patient is appearing on a report for being at risk of failing the adherence measure for diabetes medications this calendar year.   Medication: Meformin Last fill date: 07/16/24 for 90 day supply  Insurance report was not up to date. No action needed at this time.   Jon VEAR [place]au, PharmD Clinical Pharmacist [phone]

## 2024-08-06 ENCOUNTER — Ambulatory Visit: Payer: Self-pay | Admitting: *Deleted

## 2024-08-06 ENCOUNTER — Ambulatory Visit: Admitting: Family Medicine

## 2024-08-06 DIAGNOSIS — R059 Cough, unspecified: Secondary | ICD-10-CM | POA: Diagnosis not present

## 2024-08-06 DIAGNOSIS — R300 Dysuria: Secondary | ICD-10-CM | POA: Diagnosis not present

## 2024-08-06 LAB — POC COVID19 BINAXNOW: SARS Coronavirus 2 Ag: NEGATIVE

## 2024-08-06 MED ORDER — LEVOFLOXACIN 750 MG PO TABS: 750.0000 mg | ORAL_TABLET | Freq: Every day | ORAL | 0 refills | Status: DC

## 2024-08-06 NOTE — Patient Instructions (Signed)
 Follow up for any fever or recurrent vomiting.

## 2024-08-06 NOTE — Telephone Encounter (Signed)
 FYI Only or Action Required?: FYI only for provider.  Patient was last seen in primary care on 06/14/2024 by [name], DO.  Called Nurse Triage reporting No chief complaint on file..  Symptoms began several days ago.  Interventions attempted: OTC medications: Mucinex .  Symptoms are: unchanged.  Triage Disposition: See Physician Within 24 Hours  Patient/caregiver understands and will follow disposition?: yes   Reason for Disposition  [1] Continuous (nonstop) coughing interferes with work or school AND [2] no improvement using cough treatment per Care Advice  Answer Assessment - Initial Assessment Questions 1. ONSET: When did the cough begin?      Friday 2. SEVERITY: How bad is the cough today?      Continuous cough 3. SPUTUM: Describe the color of your sputum (e.g., none, dry cough; clear, white, yellow, green)     Yellow congestion from the nose- patient has parkinson, clear 4. HEMOPTYSIS: Are you coughing up any blood? If Yes, ask: How much? (e.g., flecks, streaks, tablespoons, etc.)     no 5. DIFFICULTY BREATHING: Are you having difficulty breathing? If Yes, ask: How bad is it? (e.g., mild, moderate, severe)      Exacerbated by cough, nasal congestion 6. FEVER: Do you have a fever? If Yes, ask: What is your temperature, how was it measured, and when did it start?     No-does wake in am sweating 7. CARDIAC HISTORY: Do you have any history of heart disease? (e.g., heart attack, congestive heart failure)      Hx stint 2003 8. LUNG HISTORY: Do you have any history of lung disease?  (e.g., pulmonary embolus, asthma, emphysema)     Hx pneumonia  9. PE RISK FACTORS: Do you have a history of blood clots? (or: recent major surgery, recent prolonged travel, bedridden)     Hx aneurism  10. OTHER SYMPTOMS: Do you have any other symptoms? (e.g., runny nose, wheezing, chest pain)       Chest pain- due to cough  Protocols used: Cough - Acute  Productive-A-AH   Copied from CRM #[id number]. Topic: Clinical - Red Word Triage >> Aug 06, 2024  8:14 AM Charlet HERO wrote: Red Word that prompted transfer to Nurse Triage: Wife is calling about husband being sick he vomited on friday and he has pain in his chest think he is running low grade fever sweating over night. Walter [name]

## 2024-08-06 NOTE — Progress Notes (Unsigned)
 Established Patient Office Visit  Subjective   Patient ID: Walter [name], male    DOB: May 27, 1946  Age: 78 y.o. MRN: [id number]  Chief Complaint  Patient presents with   Cough    Coughing since Saturday. Yellow mucus. Taking mucinex . Sweating at night. Low grade this morning. Concerns getting pneumonia.    Vomiting    On Saturday.    Medical Management of Chronic Issues    This morning glucose reading fasting 130   Urinary Frequency    Spouse reports urinating a lot of night.     HPI  {History (Optional):23778}  Todd has history of hypertension, type 2 diabetes, GERD, Parkinson's disease, history of abdominal aortic aneurysm repair, protein calorie malnutrition.  Seen today with onset last Thursday of cough.  Saturday had episodes of vomiting.  Blood sugars up slightly over the weekend.  Denies any body aches.  No further nausea or vomiting.  Also had little bit of mild burning with urination last week but not consistently.  In July he went to the ER with increased weakness and had Enterococcus UTI.  That was treated with antibiotics.  No reported fever.  Went to church for the first time in months couple weeks ago.  They think he might have picked up something there.  Past Medical History:  Diagnosis Date   Allergy    Cancer (HCC)    Skin cancer   Cataract    Coronary artery disease    Diabetes mellitus    Diverticulosis    Esophageal stricture    GERD (gastroesophageal reflux disease)    History of hiatal hernia    Hyperlipidemia    Hypertension    Peripheral arterial disease    Peripheral vascular disease    Past Surgical History:  Procedure Laterality Date   ABDOMINAL AORTIC ENDOVASCULAR STENT GRAFT N/A 09/16/2023   Procedure: ABDOMINAL AORTA ANEURYSM STENT USING COOK 32 X STENT AND ENDO-ANCHORS X 7;  Surgeon: Sheree Penne Bruckner, MD;  Location: [facility] OR;  Service: Vascular;  Laterality: N/A;   ANEURYSM COILING Right 09/16/2023   Procedure: COILING TO  RIGHT COMMON ILIAC;  Surgeon: Sheree Penne Bruckner, MD;  Location: [facility] OR;  Service: Vascular;  Laterality: Right;   BIOPSY  03/07/2023   Procedure: BIOPSY;  Surgeon: Albertus Gordy HERO, MD;  Location: [facility] ENDOSCOPY;  Service: Gastroenterology;;   BYPASS GRAFT FEMORAL-PERONEAL Left 09/16/2023   Procedure: LEFT EXTERNAL ILIAC TO COMMON FEMORAL BYPASS USING HEMASHIELD GOLD GRAFT;  Surgeon: Sheree Penne Bruckner, MD;  Location: [facility] OR;  Service: Vascular;  Laterality: Left;   CATARACT EXTRACTION Bilateral    COLONOSCOPY WITH PROPOFOL  N/A 03/07/2023   Procedure: COLONOSCOPY WITH PROPOFOL ;  Surgeon: Albertus Gordy HERO, MD;  Location: [facility] ENDOSCOPY;  Service: Gastroenterology;  Laterality: N/A;   CORONARY ANGIOPLASTY WITH STENT PLACEMENT  2004   ENDARTERECTOMY Left 09/16/2023   Procedure: LEFT ILIAC ENDARTERECTOMY;  Surgeon: Sheree Penne Bruckner, MD;  Location[facility] OR;  Service: Vascular;  Laterality: Left;   ENDARTERECTOMY FEMORAL Bilateral 09/16/2023   Procedure: BILATERAL FEMORAL ENDARTERECTOMY;  Surgeon: Sheree Penne Bruckner, MD;  Location: [facility] OR;  Service: Vascular;  Laterality: Bilateral;   ENTEROSCOPY N/A 09/28/2023   Procedure: ENTEROSCOPY;  Surgeon: Federico Rosario BROCKS, MD;  Location: [facility] ENDOSCOPY;  Service: Gastroenterology;  Laterality: N/A;   ESOPHAGOGASTRODUODENOSCOPY  06/07/2012   Procedure: ESOPHAGOGASTRODUODENOSCOPY (EGD);  Surgeon: Princella HERO Nida, MD;  Location: [facility]OSCOPY;  Service: Endoscopy;  Laterality: N/A;   ESOPHAGOGASTRODUODENOSCOPY (EGD) WITH PROPOFOL  N/A 03/07/2023  Procedure: ESOPHAGOGASTRODUODENOSCOPY (EGD) WITH PROPOFOL ;  Surgeon: Albertus Gordy HERO, MD;  Location[facility] ENDOSCOPY;  Service: Gastroenterology;  Laterality: N/A;   FEMORAL-FEMORAL BYPASS GRAFT Bilateral 09/16/2023   Procedure: LEFT TO RIGHT BYPASS GRAFT FEMORAL-FEMORAL ARTERY USING HEMSHIELD GOLD GRAFT;  Surgeon: Sheree Penne Bruckner, MD;  Location: [facility] OR;  Service: Vascular;  Laterality: Bilateral;    HOT HEMOSTASIS N/A 03/07/2023   Procedure: HOT HEMOSTASIS (ARGON PLASMA COAGULATION/BICAP);  Surgeon: Albertus Gordy HERO, MD;  Location: [facility] ENDOSCOPY;  Service: Gastroenterology;  Laterality: N/A;   HOT HEMOSTASIS N/A 09/28/2023   Procedure: HOT HEMOSTASIS (ARGON PLASMA COAGULATION;  Surgeon: Federico Rosario BROCKS, MD;  Location: [facility] ENDOSCOPY;  Service: Gastroenterology;  Laterality: N/A;   IR KYPHO LUMBAR INC FX REDUCE BONE BX UNI/BIL CANNULATION INC/IMAGING  03/02/2023   IVC FILTER INSERTION N/A 03/09/2023   Procedure: IVC FILTER INSERTION;  Surgeon: Lanis Fonda BRAVO, MD;  Location: [facility] INVASIVE CV LAB;  Service: Cardiovascular;  Laterality: N/A;   POLYPECTOMY  03/07/2023   Procedure: POLYPECTOMY;  Surgeon: Albertus Gordy HERO, MD;  Location: [facility] ENDOSCOPY;  Service: Gastroenterology;;   SHOULDER SURGERY  11/01/2004    reports that he quit smoking about 24 years ago. His smoking use included cigarettes. He has never used smokeless tobacco. He reports that he does not drink alcohol and does not use drugs. family history includes Breast cancer in his mother; Healthy in his son and son; Parkinson's disease in his sister. No Known Allergies  Review of Systems  Constitutional:  Negative for chills and fever.  HENT:  Positive for congestion. Negative for sore throat.   Respiratory:  Positive for cough.   Gastrointestinal:  Negative for diarrhea.  Genitourinary:  Positive for dysuria. Negative for hematuria.      Objective:     There were no vitals taken for this visit. BP Readings from Last 3 Encounters:  05/23/24 (!) 168/75  05/22/24 (!) 150/70  05/14/24 (!) 156/71   Wt Readings from Last 3 Encounters:  05/23/24 134 lb 7.7 oz (61 kg)  05/22/24 134 lb (60.8 kg)  05/09/24 132 lb (59.9 kg)      Physical Exam Vitals reviewed.  Constitutional:      General: He is not in acute distress.    Appearance: He is not ill-appearing.  HENT:     Right Ear: Tympanic membrane normal.     Left Ear: Tympanic  membrane normal.  Cardiovascular:     Rate and Rhythm: Normal rate and regular rhythm.  Pulmonary:     Effort: Pulmonary effort is normal.     Breath sounds: Normal breath sounds. No wheezing or rales.  Musculoskeletal:     Cervical back: Neck supple.  Neurological:     Mental Status: He is alert.      No results found for any visits on 08/06/24.  Last CBC Lab Results  Component Value Date   WBC 7.8 05/23/2024   HGB 10.1 (L) 05/23/2024   HCT 32.9 (L) 05/23/2024   MCV 93.2 05/23/2024   MCH 28.6 05/23/2024   RDW 15.0 05/23/2024   PLT 188 05/23/2024   Last metabolic panel Lab Results  Component Value Date   GLUCOSE 180 (H) 05/23/2024   NA 138 05/23/2024   K 3.9 05/23/2024   CL 104 05/23/2024   CO2 23 05/23/2024   BUN 31 (H) 05/23/2024   CREATININE 1.18 05/23/2024   GFRNONAA >60 05/23/2024   CALCIUM  9.0 05/23/2024   PHOS 3.6 02/22/2023   PROT 7.1 05/23/2024  ALBUMIN  3.4 (L) 05/23/2024   BILITOT 0.4 05/23/2024   ALKPHOS 86 05/23/2024   AST 24 05/23/2024   ALT 29 05/23/2024   ANIONGAP 11 05/23/2024      The ASCVD Risk score (Arnett DK, et al., 2019) failed to calculate for the following reasons:   The valid total cholesterol range is 130 to 320 mg/dL    Assessment & Plan:   #1 upper respiratory symptoms with cough.  Patient very high risk for pneumonia.  Lung exam clear.  No reported fever.  COVID test negative.  He had tremendous difficulty clearing infections in the past.  History of recurrent pneumonia.  We decided to go and cover with Levaquin  750 mg once daily for 7 days.  Follow-up promptly for any fever or increased shortness of breath  #2 dysuria-patient was unable to give urine specimen.  Starting antibiotic as above.  Follow-up for any fever or persistent symptoms  Recommend setting up 81-month follow-up and obtain follow-up labs and including repeat CBC, chemistries, A1c  No follow-ups on file.    Wolm Scarlet, MD

## 2024-08-07 NOTE — Telephone Encounter (Signed)
 Noted patient had appt yesterday

## 2024-08-08 ENCOUNTER — Ambulatory Visit: Admitting: Urology

## 2024-08-23 ENCOUNTER — Ambulatory Visit: Admitting: Neurology

## 2024-08-23 ENCOUNTER — Encounter: Payer: Self-pay | Admitting: Neurology

## 2024-08-23 VITALS — BP 161/55 | HR 74 | Ht 67.0 in | Wt 144.0 lb

## 2024-08-23 DIAGNOSIS — G3184 Mild cognitive impairment, so stated: Secondary | ICD-10-CM

## 2024-08-23 DIAGNOSIS — G20A1 Parkinson's disease without dyskinesia, without mention of fluctuations: Secondary | ICD-10-CM | POA: Diagnosis not present

## 2024-08-23 DIAGNOSIS — R400 Somnolence: Secondary | ICD-10-CM

## 2024-08-23 DIAGNOSIS — G4700 Insomnia, unspecified: Secondary | ICD-10-CM

## 2024-08-23 MED ORDER — CARBIDOPA-LEVODOPA 25-100 MG PO TABS: ORAL_TABLET | ORAL | 3 refills | Status: AC

## 2024-08-23 MED ORDER — MEMANTINE HCL 10 MG PO TABS: 10.0000 mg | ORAL_TABLET | Freq: Two times a day (BID) | ORAL | 3 refills | Status: AC
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
## 2024-08-23 NOTE — Patient Instructions (Addendum)
 Good to see you.  An order will be sent for a in-lab sleep study at [facility] sleep center  2. Continue all your medications  3. Increase exercise to three times a day  4. Follow-up in 6 months, call for any changes

## 2024-09-05 ENCOUNTER — Telehealth: Payer: Self-pay

## 2024-09-05 NOTE — Telephone Encounter (Signed)
 I have attempted to send a referral to [facility] sleep center and it has been unsuccessful. Called [facility] [phone] and was informed they do not have a sleep center. Will need to send patient to a new location.

## 2024-09-05 NOTE — Telephone Encounter (Signed)
 Yes, pls send to a different location, hopefully something close to their home because wife has a hard time driving him around. Thanks

## 2024-09-06 NOTE — Telephone Encounter (Signed)
 Unfortunately there are no locations near home that I am able to find. Will need to send to [facility].

## 2024-09-07 NOTE — Telephone Encounter (Signed)
 Called patient and unable to leave a message as mailbox is not set up.

## 2024-09-10 NOTE — Telephone Encounter (Signed)
 Called and spoke to Patients wife and informed her that we would have to send patient to [facility] as there are no locations near them for Sleep study.   Patients wife stated that she and patient would like to hold on that referral because patient has been doing really good on Melatonin 3mg  and has been sleeping a lot better.   Patient and wife will contact us  if they want to proceed with Sleep study at [facility].

## 2024-09-10 NOTE — Telephone Encounter (Signed)
 Noted, thanks!

## 2024-09-12 ENCOUNTER — Other Ambulatory Visit: Payer: Self-pay | Admitting: Family Medicine

## 2024-09-17 NOTE — Progress Notes (Unsigned)
 Assessment & Plan:    Probable urethral bleeding, 1 episode, not persistent.  Urinalysis today is clear.  I have reassured the patient and his wife.  They will return if recurrent issues, otherwise they will come back as needed   HPI: 3.4.2025: This 78 year old male comes in today, referred by Gloria Zarwolo for evaluation and management of blood from his urethra.  In mid January, he had 1 episode of blood dripping out of his urethra after urinating.  This was not associated with blood in his urine, as there was no significant blood in the toilet.  He had no hematuria before or after.  He did have a urinalysis performed on 17 January which revealed 2+ blood on dipstick.  Microscopic not performed.  Prior urinalyses were all negative for blood.  He is on aspirin , but no blood thinners.  He has had a significant number of vascular procedures performed in 2024.  He did have anemia, thought to be of GI source.  He has never had to see a urologist before for any issues.  Last PSA performed in 2020 was 1.21.  11.18.2025: PMH: Past Medical History:  Diagnosis Date   Allergy    Cancer (HCC)    Skin cancer   Cataract    Coronary artery disease    Diabetes mellitus    Diverticulosis    Esophageal stricture    GERD (gastroesophageal reflux disease)    History of hiatal hernia    Hyperlipidemia    Hypertension    Peripheral arterial disease    Peripheral vascular disease     Surgical History: Past Surgical History:  Procedure Laterality Date   ABDOMINAL AORTIC ENDOVASCULAR STENT GRAFT N/A 09/16/2023   Procedure: ABDOMINAL AORTA ANEURYSM STENT USING COOK 32 X STENT AND ENDO-ANCHORS X 7;  Surgeon: Sheree Penne Bruckner, MD;  Location: [facility] OR;  Service: Vascular;  Laterality: N/A;   ANEURYSM COILING Right 09/16/2023   Procedure: COILING TO RIGHT COMMON ILIAC;  Surgeon: Sheree Penne Bruckner, MD;  Location: [facility] OR;  Service: Vascular;  Laterality: Right;   BIOPSY  03/07/2023    Procedure: BIOPSY;  Surgeon: Albertus Gordy HERO, MD;  Location: [facility] ENDOSCOPY;  Service: Gastroenterology;;   BYPASS GRAFT FEMORAL-PERONEAL Left 09/16/2023   Procedure: LEFT EXTERNAL ILIAC TO COMMON FEMORAL BYPASS USING HEMASHIELD GOLD GRAFT;  Surgeon: Sheree Penne Bruckner, MD;  Location: [facility] OR;  Service: Vascular;  Laterality: Left;   CATARACT EXTRACTION Bilateral    COLONOSCOPY WITH PROPOFOL  N/A 03/07/2023   Procedure: COLONOSCOPY WITH PROPOFOL ;  Surgeon: Albertus Gordy HERO, MD;  Location: [facility] ENDOSCOPY;  Service: Gastroenterology;  Laterality: N/A;   CORONARY ANGIOPLASTY WITH STENT PLACEMENT  2004   ENDARTERECTOMY Left 09/16/2023   Procedure: LEFT ILIAC ENDARTERECTOMY;  Surgeon: Sheree Penne Bruckner, MD;  Location: [facility] OR;  Service: Vascular;  Laterality: Left;   ENDARTERECTOMY FEMORAL Bilateral 09/16/2023   Procedure: BILATERAL FEMORAL ENDARTERECTOMY;  Surgeon: Sheree Penne Bruckner, MD;  Location: [facility] OR;  Service: Vascular;  Laterality: Bilateral;   ENTEROSCOPY N/A 09/28/2023   Procedure: ENTEROSCOPY;  Surgeon: Federico Rosario BROCKS, MD;  Location: [facility] ENDOSCOPY;  Service: Gastroenterology;  Laterality: N/A;   ESOPHAGOGASTRODUODENOSCOPY  06/07/2012   Procedure: ESOPHAGOGASTRODUODENOSCOPY (EGD);  Surgeon: Princella HERO Nida, MD;  Location: [facility];  Service: Endoscopy;  Laterality: N/A;   ESOPHAGOGASTRODUODENOSCOPY (EGD) WITH PROPOFOL  N/A 03/07/2023   Procedure: ESOPHAGOGASTRODUODENOSCOPY (EGD) WITH PROPOFOL ;  Surgeon: Albertus Gordy HERO, MD;  Location: [facility] ENDOSCOPY;  Service: Gastroenterology;  Laterality: N/A;   FEMORAL-FEMORAL  BYPASS GRAFT Bilateral 09/16/2023   Procedure: LEFT TO RIGHT BYPASS GRAFT FEMORAL-FEMORAL ARTERY USING HEMSHIELD GOLD GRAFT;  Surgeon: Sheree Penne Bruckner, MD;  Location: [facility] OR;  Service: Vascular;  Laterality: Bilateral;   HOT HEMOSTASIS N/A 03/07/2023   Procedure: HOT HEMOSTASIS (ARGON PLASMA COAGULATION/BICAP);  Surgeon: Albertus Gordy HERO, MD;  Location: [facility]  ENDOSCOPY;  Service: Gastroenterology;  Laterality: N/A;   HOT HEMOSTASIS N/A 09/28/2023   Procedure: HOT HEMOSTASIS (ARGON PLASMA COAGULATION;  Surgeon: Federico Rosario BROCKS, MD;  Location: [facility] ENDOSCOPY;  Service: Gastroenterology;  Laterality: N/A;   IR KYPHO LUMBAR INC FX REDUCE BONE BX UNI/BIL CANNULATION INC/IMAGING  03/02/2023   IVC FILTER INSERTION N/A 03/09/2023   Procedure: IVC FILTER INSERTION;  Surgeon: Lanis Fonda BRAVO, MD;  Location: [facility] INVASIVE CV LAB;  Service: Cardiovascular;  Laterality: N/A;   POLYPECTOMY  03/07/2023   Procedure: POLYPECTOMY;  Surgeon: Albertus Gordy HERO, MD;  Location: [facility] ENDOSCOPY;  Service: Gastroenterology;;   SHOULDER SURGERY  11/01/2004    Home Medications:  Allergies as of 09/18/2024   No Known Allergies      Medication List        Accurate as of September 17, 2024  6:09 PM. If you have any questions, ask your nurse or doctor.          acetaminophen  325 MG tablet Commonly known as: TYLENOL  Take 1-2 tablets (325-650 mg total) by mouth every 4 (four) hours as needed for mild pain (pain score 1-3).   aspirin  EC 81 MG tablet Take 1 tablet (81 mg total) by mouth daily. Swallow whole.   atorvastatin  40 MG tablet Commonly known as: LIPITOR Take 1 tablet by mouth once daily   carbidopa -levodopa  25-100 MG tablet Commonly known as: Sinemet  Take 2 tablet three times a day before meals   cyanocobalamin  2000 MCG tablet Take 1 tablet (2,000 mcg total) by mouth daily.   docusate sodium  100 MG capsule Commonly known as: COLACE Take 1 capsule (100 mg total) by mouth daily.   Iron  325 (65 Fe) MG Tabs Take 1 tablet (325 mg total) by mouth daily.   levofloxacin  750 MG tablet Commonly known as: Levaquin  Take 1 tablet (750 mg total) by mouth daily.   losartan  25 MG tablet Commonly known as: COZAAR  Take 1 tablet (25 mg total) by mouth at bedtime.   memantine  10 MG tablet Commonly known as: NAMENDA  Take 1 tablet (10 mg total) by mouth 2 (two) times  daily.   metFORMIN  500 MG tablet Commonly known as: GLUCOPHAGE  Take 1 tablet (500 mg total) by mouth 2 (two) times daily with a meal. What changed: when to take this   multivitamin with minerals Tabs tablet Take 1 tablet by mouth daily.   ondansetron  8 MG disintegrating tablet Commonly known as: ZOFRAN -ODT Take 1 tablet (8 mg total) by mouth every 8 (eight) hours as needed for nausea or vomiting.   ONE TOUCH ULTRA 2 w/Device Kit 1 kit by Does not apply route daily.   OneTouch Ultra test strip Generic drug: glucose blood USE 1 STRIP TO CHECK GLUCOSE TWICE DAILY   onetouch ultrasoft lancets Check blood sugars once daily. Dx: E11.51   pantoprazole  40 MG tablet Commonly known as: PROTONIX  Take 1 tablet by mouth twice daily        Allergies: No Known Allergies  Family History: Family History  Problem Relation Age of Onset   Breast cancer Mother    Healthy Son    Healthy Son    Parkinson's  disease Sister    Colon cancer Neg Hx    Esophageal cancer Neg Hx    Rectal cancer Neg Hx    Stomach cancer Neg Hx     Social History:  reports that he quit smoking about 24 years ago. His smoking use included cigarettes. He has never used smokeless tobacco. He reports that he does not drink alcohol and does not use drugs.  ROS: All other review of systems were reviewed and are negative except what is noted above in HPI  Physical Exam: There were no vitals taken for this visit.  Constitutional:  Alert and oriented, No acute distress. HEENT: [place] AT, moist mucus membranes.  Trachea midline, no masses. Cardiovascular: No clubbing, cyanosis, or edema. Respiratory: Normal respiratory effort, no increased work of breathing. Skin: No rashes, bruises or suspicious lesions. Neurologic: Grossly intact, no focal deficits, moving all 4 extremities. Psychiatric: Normal mood and affect.  Laboratory Data: Lab Results  Component Value Date   WBC 7.8 05/23/2024   HGB 10.1 (L) 05/23/2024    HCT 32.9 (L) 05/23/2024   MCV 93.2 05/23/2024   PLT 188 05/23/2024    Lab Results  Component Value Date   CREATININE 1.18 05/23/2024    Lab Results  Component Value Date   PSA 1.21 04/19/2019   PSA 0.89 03/26/2014   PSA 0.76 03/06/2012    No results found for: TESTOSTERONE  Lab Results  Component Value Date   HGBA1C 5.6 01/02/2024    Urinalysis    Component Value Date/Time   COLORURINE YELLOW 05/23/2024 1601   APPEARANCEUR HAZY (A) 05/23/2024 1601   APPEARANCEUR Clear 01/03/2024 1411   LABSPEC 1.019 05/23/2024 1601   PHURINE 5.0 05/23/2024 1601   GLUCOSEU NEGATIVE 05/23/2024 1601   HGBUR NEGATIVE 05/23/2024 1601   HGBUR negative 09/17/2009 0847   BILIRUBINUR NEGATIVE 05/23/2024 1601   BILIRUBINUR Negative 04/20/2024 1526   BILIRUBINUR Negative 01/03/2024 1411   KETONESUR NEGATIVE 05/23/2024 1601   PROTEINUR NEGATIVE 05/23/2024 1601   UROBILINOGEN 0.2 04/20/2024 1526   UROBILINOGEN 0.2 09/17/2009 0847   NITRITE NEGATIVE 05/23/2024 1601   LEUKOCYTESUR LARGE (A) 05/23/2024 1601    Lab Results  Component Value Date   LABMICR See below: 01/03/2024   WBCUA 6-10 (A) 01/03/2024   LABEPIT 0-10 01/03/2024   BACTERIA NONE SEEN 05/23/2024    Pertinent Imaging:  Prior CT images reviewed  Prior physicians records reviewed

## 2024-09-18 ENCOUNTER — Ambulatory Visit (INDEPENDENT_AMBULATORY_CARE_PROVIDER_SITE_OTHER): Admitting: Urology

## 2024-09-18 VITALS — BP 146/54 | HR 72

## 2024-09-18 DIAGNOSIS — Z09 Encounter for follow-up examination after completed treatment for conditions other than malignant neoplasm: Secondary | ICD-10-CM

## 2024-09-18 DIAGNOSIS — Z87440 Personal history of urinary (tract) infections: Secondary | ICD-10-CM | POA: Diagnosis not present

## 2024-09-18 DIAGNOSIS — R3129 Other microscopic hematuria: Secondary | ICD-10-CM

## 2024-09-18 DIAGNOSIS — N3000 Acute cystitis without hematuria: Secondary | ICD-10-CM

## 2024-09-19 ENCOUNTER — Other Ambulatory Visit: Payer: Self-pay

## 2024-09-19 DIAGNOSIS — R3129 Other microscopic hematuria: Secondary | ICD-10-CM

## 2024-09-19 LAB — URINALYSIS, ROUTINE W REFLEX MICROSCOPIC
Bilirubin, UA: NEGATIVE
Glucose, UA: NEGATIVE
Ketones, UA: NEGATIVE
Leukocytes,UA: NEGATIVE
Nitrite, UA: NEGATIVE
Protein,UA: NEGATIVE
RBC, UA: NEGATIVE
Specific Gravity, UA: 1.015 (ref 1.005–1.030)
Urobilinogen, Ur: 1 mg/dL (ref 0.2–1.0)
pH, UA: 6 (ref 5.0–7.5)

## 2024-09-24 ENCOUNTER — Telehealth: Payer: Self-pay

## 2024-09-24 ENCOUNTER — Encounter: Payer: Self-pay | Admitting: Neurology

## 2024-09-24 NOTE — Telephone Encounter (Signed)
 Patient left a voice message 09-24-2024.  Patient called requesting UA results from  Test completed on 09-19-2024.  Still having frequent urination.  Best number to contact: [phone]

## 2024-09-24 NOTE — Telephone Encounter (Signed)
-----   Message from Garnette HERO Dahlstedt sent at 09/24/2024  2:54 PM EST ----- Let pt know that urinalysis is clear--no uti ----- Message ----- From: Gretta Carlos SAUNDERS, CMA Sent: 09/24/2024  12:00 PM EST To: Garnette Shack, MD  Pt want results, having frequent urination. Please advise

## 2024-09-24 NOTE — Telephone Encounter (Signed)
Pt wife made aware and voiced understanding.

## 2024-09-24 NOTE — Telephone Encounter (Signed)
 Return call to pt. Spoke to pt's wife made her aware UA results forward to MD for review and someone will reach with MD Dahlstedt recommendation and response. Voiced understanding

## 2024-10-16 NOTE — Progress Notes (Signed)
° °  10/16/2024  Patient ID: Walter [name], male   DOB: 06-26-1946, 78 y.o.   MRN: [id number]  Pharmacy Quality Measure Review  This patient is appearing on a report for being at risk of failing the adherence measure for diabetes medications this calendar year.   Medication: Metformin  Last fill date: 07/16/24 for 90 day supply  Patient taking once daily instead of BID, well controlled DM.   Jon VEAR Lindau, PharmD Clinical Pharmacist [phone]

## 2024-10-24 ENCOUNTER — Other Ambulatory Visit: Payer: Self-pay | Admitting: Family Medicine

## 2024-10-24 DIAGNOSIS — E785 Hyperlipidemia, unspecified: Secondary | ICD-10-CM

## 2024-10-26 ENCOUNTER — Other Ambulatory Visit: Payer: Self-pay | Admitting: Family Medicine

## 2024-11-08 ENCOUNTER — Ambulatory Visit: Payer: Self-pay | Admitting: Family Medicine

## 2024-11-08 MED ORDER — LEVOFLOXACIN 500 MG PO TABS: 500.0000 mg | ORAL_TABLET | Freq: Every day | ORAL | 0 refills | Status: AC

## 2024-11-08 NOTE — Telephone Encounter (Signed)
 Patient's wife informed of the message below and voiced understanding. Rx sent

## 2024-11-08 NOTE — Telephone Encounter (Signed)
 I spoke with the patient's wife and she reported the patient is having a productive cough with green sputum for the pat couple of days. I offered the patient's wife a visit here in the office but she declined at this time and reported she could not bring him because she is fatigued and having vision issues. Blima Inquired if PCP can send in a prescription for patient to help with cough and chest congestion or if he has any recommendation for OTC treatment. Patient was advised to seek treatment in ED per Triage nurse and patient's wife declined at this time.

## 2024-11-08 NOTE — Telephone Encounter (Signed)
 FYI Only or Action Required?: FYI only for provider: ED advised. Refused ED  Patient was last seen in primary care on 08/06/2024 by Walter [name] ORN, MD.  Called Nurse Triage reporting Fatigue and Cough.  Symptoms began several days ago.  Interventions attempted: OTC medications: Nyquil.  Symptoms are: gradually worsening.  Triage Disposition: Go to ED Now (or PCP Triage)  Patient/caregiver understands and will follow disposition?: No, wishes to speak with PCP     Spoke with pts Walter [name] (on [facility]). Pt with hx of Parkinson's. Recently exposed to RSV by a family member. Has been having increased weakness since with chest congestion x4-5 days. Coughing up yellow mucus. Soaking 15 handkerchiefs via nasal drainage per day.  No SOB, CP or fever.  No increased confusion. Prior to getting sick, was able to get self up to the bathroom. Now only able to walk a few steps. Moderate cough, worse at night.  Advised ED, declines d/t the flu going around right now. Just asking for something to help with pts congestion. Called [facility] and spoke with Madeline to notify of ED refusal. Advised ED or calling 911 worsening symptoms.      Copied from CRM #[id number]. Topic: Clinical - Red Word Triage >> Nov 08, 2024  9:41 AM Donna BRAVO wrote: Red Word that prompted transfer to Nurse Triage:   [name], [name]  [phone]  exposed to RSV Don sick parkinsons -getting weaker -cough -hard to cough up phlem yellow/green -sugar running high for him 126 - Reason for Disposition  Patient sounds very sick or weak to the triager  Answer Assessment - Initial Assessment Questions 1. DESCRIPTION: Describe how you are feeling.     Severe weakness and cough  2. SEVERITY: How bad is it?  Can you stand and walk?     Severe compared to baseline. Only able to walk a few steps.  3. ONSET: When did these symptoms begin? (e.g., hours, days, weeks, months)     4-5 days ago  4. CAUSE: What do you think is causing  the weakness or fatigue? (e.g., not drinking enough fluids, medical problem, trouble sleeping)     Recent exposure to RSV  5. NEW MEDICINES:  Have you started on any new medicines recently? (e.g., opioid pain medicines, benzodiazepines, muscle relaxants, antidepressants, antihistamines, neuroleptics, beta blockers)     *No Answer*  6. OTHER SYMPTOMS: Do you have any other symptoms? (e.g., chest pain, fever, cough, SOB, vomiting, diarrhea, bleeding, other areas of pain)     *No Answer*  Protocols used: Weakness (Generalized) and Fatigue-A-AH

## 2024-11-19 ENCOUNTER — Encounter: Payer: Self-pay | Admitting: Family Medicine

## 2024-11-19 ENCOUNTER — Ambulatory Visit: Admitting: Family Medicine

## 2024-11-19 ENCOUNTER — Ambulatory Visit: Payer: Self-pay

## 2024-11-19 VITALS — BP 130/60 | HR 79 | Temp 98.7°F | Wt 148.5 lb

## 2024-11-19 DIAGNOSIS — R059 Cough, unspecified: Secondary | ICD-10-CM

## 2024-11-19 DIAGNOSIS — Z7984 Long term (current) use of oral hypoglycemic drugs: Secondary | ICD-10-CM | POA: Diagnosis not present

## 2024-11-19 DIAGNOSIS — E1151 Type 2 diabetes mellitus with diabetic peripheral angiopathy without gangrene: Secondary | ICD-10-CM | POA: Diagnosis not present

## 2024-11-19 DIAGNOSIS — E782 Mixed hyperlipidemia: Secondary | ICD-10-CM | POA: Diagnosis not present

## 2024-11-19 LAB — POCT GLYCOSYLATED HEMOGLOBIN (HGB A1C): Hemoglobin A1C: 5.5 % (ref 4.0–5.6)

## 2024-11-19 LAB — POC COVID19 BINAXNOW: SARS Coronavirus 2 Ag: NEGATIVE

## 2024-11-19 LAB — POCT INFLUENZA A/B
Influenza A, POC: NEGATIVE
Influenza B, POC: NEGATIVE

## 2024-11-19 NOTE — Progress Notes (Signed)
 "  Established Patient Office Visit  Subjective   Patient ID: Walter [name], male    DOB: [birth year]  Age: 79 y.o. 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  Seen today with 1-1/2-week history of productive cough.  Does not go out much but has been around some grandchildren that have had illness.  Has had sweats especially early morning hours consistently past several days but no documented fever.  Also has had more dyspnea with his exercise than usual.  They are requesting flu and COVID testing.  No hemoptysis.  No pleuritic pain.  No increased peripheral edema.  He has hyperglycemia and wife states his blood sugars have been riding higher over the past couple weeks with 1 fasting of over 200 but mostly mid 100 range.  He had been taking metformin  just once daily and she recently increase this to twice daily.  A1c's have consistently been well-controlled.  He has chronic normocytic anemia.  Hemoglobin generally 10 range.  Hyperlipidemia treated with atorvastatin  40 mg daily.  Needs follow-up lipids but our lab was closed at the time of his visit.  Past Medical History:  Diagnosis Date   Allergy    Cancer (HCC)    Skin cancer   Cataract    Coronary artery disease    Diabetes mellitus    Diverticulosis    Esophageal stricture    GERD (gastroesophageal reflux disease)    History of hiatal hernia    Hyperlipidemia    Hypertension    Peripheral arterial disease    Peripheral vascular disease    Past Surgical History:  Procedure Laterality Date   ABDOMINAL AORTIC ENDOVASCULAR STENT GRAFT N/A 09/16/2023   Procedure: ABDOMINAL AORTA ANEURYSM STENT USING COOK 32 X STENT AND ENDO-ANCHORS X 7;  Surgeon: Sheree Penne Bruckner, MD;  Location: [facility] OR;  Service: Vascular;  Laterality: N/A;    ANEURYSM COILING Right 09/16/2023   Procedure: COILING TO RIGHT COMMON ILIAC;  Surgeon: Sheree Penne Bruckner, MD;  Location: [facility] OR;  Service: Vascular;  Laterality: Right;   BIOPSY  03/07/2023   Procedure: BIOPSY;  Surgeon: Albertus Gordy HERO, MD;  Location: [facility] ENDOSCOPY;  Service: Gastroenterology;;   BYPASS GRAFT FEMORAL-PERONEAL Left 09/16/2023   Procedure: LEFT EXTERNAL ILIAC TO COMMON FEMORAL BYPASS USING HEMASHIELD GOLD GRAFT;  Surgeon: Sheree Penne Bruckner, MD;  Location: [facility] OR;  Service: Vascular;  Laterality: Left;   CATARACT EXTRACTION Bilateral    COLONOSCOPY WITH PROPOFOL  N/A 03/07/2023   Procedure: COLONOSCOPY WITH PROPOFOL ;  Surgeon: Albertus Gordy HERO, MD;  Location: [facility] ENDOSCOPY;  Service: Gastroenterology;  Laterality: N/A;   CORONARY ANGIOPLASTY WITH STENT PLACEMENT  2004   ENDARTERECTOMY Left 09/16/2023   Procedure: LEFT ILIAC ENDARTERECTOMY;  Surgeon: Sheree Penne Bruckner, MD;  Location: [facility] OR;  Service: Vascular;  Laterality: Left;   ENDARTERECTOMY FEMORAL Bilateral 09/16/2023   Procedure: BILATERAL FEMORAL ENDARTERECTOMY;  Surgeon: Sheree Penne Bruckner, MD;  Location: [facility] OR;  Service: Vascular;  Laterality: Bilateral;   ENTEROSCOPY N/A 09/28/2023   Procedure: ENTEROSCOPY;  Surgeon: Federico Rosario BROCKS, MD;  Location: [facility] ENDOSCOPY;  Service: Gastroenterology;  Laterality: N/A;   ESOPHAGOGASTRODUODENOSCOPY  06/07/2012   Procedure: ESOPHAGOGASTRODUODENOSCOPY (EGD);  Surgeon: Princella HERO Nida, MD;  Location: [facility] ENDOSCOPY;  Service: Endoscopy;  Laterality: N/A;   ESOPHAGOGASTRODUODENOSCOPY (EGD) WITH PROPOFOL  N/A 03/07/2023  Procedure: ESOPHAGOGASTRODUODENOSCOPY (EGD) WITH PROPOFOL ;  Surgeon: Albertus Gordy HERO, MD;  Location: [facility] ENDOSCOPY;  Service: Gastroenterology;  Laterality: N/A;   FEMORAL-FEMORAL BYPASS GRAFT Bilateral 09/16/2023   Procedure: LEFT TO RIGHT BYPASS GRAFT FEMORAL-FEMORAL ARTERY USING HEMSHIELD GOLD GRAFT;  Surgeon: Sheree Penne Bruckner, MD;   Location: [facility] OR;  Service: Vascular;  Laterality: Bilateral;   HOT HEMOSTASIS N/A 03/07/2023   Procedure: HOT HEMOSTASIS (ARGON PLASMA COAGULATION/BICAP);  Surgeon: Albertus Gordy HERO, MD;  Location: [facility] ENDOSCOPY;  Service: Gastroenterology;  Laterality: N/A;   HOT HEMOSTASIS N/A 09/28/2023   Procedure: HOT HEMOSTASIS (ARGON PLASMA COAGULATION;  Surgeon: Federico Rosario BROCKS, MD;  Location: [facility] ENDOSCOPY;  Service: Gastroenterology;  Laterality: N/A;   IR KYPHO LUMBAR INC FX REDUCE BONE BX UNI/BIL CANNULATION INC/IMAGING  03/02/2023   IVC FILTER INSERTION N/A 03/09/2023   Procedure: IVC FILTER INSERTION;  Surgeon: Lanis Fonda BRAVO, MD;  Location: [facility] INVASIVE CV LAB;  Service: Cardiovascular;  Laterality: N/A;   POLYPECTOMY  03/07/2023   Procedure: POLYPECTOMY;  Surgeon: Albertus Gordy HERO, MD;  Location: [facility] ENDOSCOPY;  Service: Gastroenterology;;   SHOULDER SURGERY  11/01/2004    reports that he quit smoking about 25 years ago. His smoking use included cigarettes. He has never used smokeless tobacco. He reports that he does not drink alcohol and does not use drugs. family history includes Breast cancer in his mother; Healthy in his son and son; Parkinson's disease in his sister. Allergies[1]  Review of Systems  Constitutional:  Negative for chills and fever.  HENT:  Positive for congestion. Negative for sore throat.   Respiratory:  Positive for cough. Negative for hemoptysis and wheezing.   Cardiovascular:  Negative for chest pain and leg swelling.  Gastrointestinal:  Negative for abdominal pain.  Genitourinary:  Negative for dysuria.      Objective:     BP 130/60   Pulse 79   Temp 98.7 F (37.1 C) (Oral)   Wt 148 lb 8 oz (67.4 kg)   SpO2 94%   BMI 23.26 kg/m  BP Readings from Last 3 Encounters:  11/19/24 130/60  09/18/24 (!) 146/54  08/23/24 (!) 161/55   Wt Readings from Last 3 Encounters:  11/19/24 148 lb 8 oz (67.4 kg)  08/23/24 144 lb (65.3 kg)  05/23/24 134 lb 7.7 oz (61 kg)       Physical Exam Vitals reviewed.  Constitutional:      General: He is not in acute distress.    Appearance: He is not ill-appearing.  HENT:     Right Ear: Tympanic membrane normal.     Left Ear: Tympanic membrane normal.     Mouth/Throat:     Mouth: Mucous membranes are moist.     Pharynx: Oropharynx is clear.  Cardiovascular:     Rate and Rhythm: Normal rate and regular rhythm.  Pulmonary:     Effort: Pulmonary effort is normal.     Breath sounds: Normal breath sounds. No wheezing or rales.  Musculoskeletal:     Cervical back: Neck supple.     Right lower leg: No edema.     Left lower leg: No edema.      No results found for any visits on 11/19/24.  Last CBC Lab Results  Component Value Date   WBC 7.8 05/23/2024   HGB 10.1 (L) 05/23/2024   HCT 32.9 (L) 05/23/2024   MCV 93.2 05/23/2024   MCH 28.6 05/23/2024   RDW 15.0 05/23/2024   PLT 188 05/23/2024   Last  metabolic panel Lab Results  Component Value Date   GLUCOSE 180 (H) 05/23/2024   NA 138 05/23/2024   K 3.9 05/23/2024   CL 104 05/23/2024   CO2 23 05/23/2024   BUN 31 (H) 05/23/2024   CREATININE 1.18 05/23/2024   GFRNONAA >60 05/23/2024   CALCIUM  9.0 05/23/2024   PHOS 3.6 02/22/2023   PROT 7.1 05/23/2024   ALBUMIN  3.4 (L) 05/23/2024   BILITOT 0.4 05/23/2024   ALKPHOS 86 05/23/2024   AST 24 05/23/2024   ALT 29 05/23/2024   ANIONGAP 11 05/23/2024   Last lipids Lab Results  Component Value Date   CHOL 75 09/17/2023   HDL 26 (L) 09/17/2023   LDLCALC 31 09/17/2023   TRIG 92 09/17/2023   CHOLHDL 2.9 09/17/2023   Last hemoglobin A1c Lab Results  Component Value Date   HGBA1C 5.5 11/19/2024      The ASCVD Risk score (Arnett DK, et al., 2019) failed to calculate for the following reasons:   The valid total cholesterol range is 130 to 320 mg/dL    Assessment & Plan:   #1 cough.  Patient just recently finished up empiric course of Levaquin .  Pulse ox 94% today room air.  Nonfocal lung exam.   Cough actually slightly improved today.  No documented fever but has had some night sweats.  Flu and COVID testing negative today.  No wheezing to suggest likely RSV.  Suspect acute bronchitis.  Follow-up immediately for any fever or increased shortness of breath  #2 history of hyperglycemia/type 2 diabetes.  Patient on metformin .  Recent home blood sugars have been slightly higher than usual probably reflecting acute illness.  A1c today remains well-controlled 5.5.  Continue metformin  500 mg once daily  #3 hyperlipidemia.  Overdue for lipids.  Lab was closed today.  Check fasting lipids with next labs  Wolm Scarlet, MD     [1] No Known Allergies  "

## 2024-11-19 NOTE — Telephone Encounter (Signed)
 FYI Only or Action Required?: Action required by provider: request for appointment. Appt scheduled for this afternoon - would like to be seen asap  Patient was last seen in primary care on 08/06/2024 by Micheal Wolm ORN, MD.  Called Nurse Triage reporting Cough.  Symptoms began a week ago.  Interventions attempted: Prescription medications: Antibiotics.  Symptoms are: gradually worsening.  Triage Disposition: See HCP Within 4 Hours (Or PCP Triage)  Patient/caregiver understands and will follow disposition?: Yes                   Reason for Triage: Patient's daughter is calling in for the patient's wife. Patient's wife was bathing the patient, patient is coughing up a bunch mucus and can't catch his breath. Patient took antibiotics but patient still has a really bad cough.   Reason for Disposition  [1] MILD difficulty breathing (e.g., minimal/no SOB at rest, SOB with walking, pulse < 100) AND [2] still present when not coughing  Answer Assessment - Initial Assessment Questions 1. ONSET: When did the cough begin?      Several days ago 2. SEVERITY: How bad is the cough today?      Moderate 3. SPUTUM: Describe the color of your sputum (e.g., none, dry cough; clear, white, yellow, green)     Green and yellow 4. HEMOPTYSIS: Are you coughing up any blood? If Yes, ask: How much? (e.g., flecks, streaks, tablespoons, etc.)     no 5. DIFFICULTY BREATHING: Are you having difficulty breathing? If Yes, ask: How bad is it? (e.g., mild, moderate, severe)      no 6. FEVER: Do you have a fever? If Yes, ask: What is your temperature, how was it measured, and when did it start?     Has night sweats 7. CARDIAC HISTORY: Do you have any history of heart disease? (e.g., heart attack, congestive heart failure)      htn 8. LUNG HISTORY: Do you have any history of lung disease?  (e.g., pulmonary embolus, asthma, emphysema)     Did not ask 9. PE RISK FACTORS: Do you  have a history of blood clots? (or: recent major surgery, recent prolonged travel, bedridden)     Did not ask 10. OTHER SYMPTOMS: Do you have any other symptoms? (e.g., runny nose, wheezing, chest pain)       no  Protocols used: Cough - Acute Productive-A-AH

## 2024-12-06 ENCOUNTER — Other Ambulatory Visit: Payer: Self-pay | Admitting: Family Medicine

## 2024-12-06 DIAGNOSIS — E1151 Type 2 diabetes mellitus with diabetic peripheral angiopathy without gangrene: Secondary | ICD-10-CM

## 2024-12-07 ENCOUNTER — Other Ambulatory Visit: Payer: Self-pay | Admitting: Family Medicine

## 2024-12-07 DIAGNOSIS — E1151 Type 2 diabetes mellitus with diabetic peripheral angiopathy without gangrene: Secondary | ICD-10-CM

## 2025-03-22 ENCOUNTER — Ambulatory Visit: Admitting: Neurology
